# Patient Record
Sex: Female | Born: 1965 | Race: White | Hispanic: No | Marital: Single | Smoking: Current every day smoker
Health system: Southern US, Community
[De-identification: ages and names within clinical notes are randomized; demographics above are authoritative.]

## PROBLEM LIST (undated history)

## (undated) DIAGNOSIS — M1990 Unspecified osteoarthritis, unspecified site: Secondary | ICD-10-CM

## (undated) DIAGNOSIS — I10 Essential (primary) hypertension: Secondary | ICD-10-CM

## (undated) DIAGNOSIS — J984 Other disorders of lung: Secondary | ICD-10-CM

## (undated) DIAGNOSIS — I639 Cerebral infarction, unspecified: Secondary | ICD-10-CM

## (undated) DIAGNOSIS — F32A Depression, unspecified: Secondary | ICD-10-CM

## (undated) DIAGNOSIS — M419 Scoliosis, unspecified: Secondary | ICD-10-CM

## (undated) DIAGNOSIS — G8929 Other chronic pain: Principal | ICD-10-CM

## (undated) DIAGNOSIS — A0472 Enterocolitis due to Clostridium difficile, not specified as recurrent: Secondary | ICD-10-CM

## (undated) DIAGNOSIS — M549 Dorsalgia, unspecified: Secondary | ICD-10-CM

## (undated) DIAGNOSIS — M545 Low back pain: Principal | ICD-10-CM

## (undated) DIAGNOSIS — G43909 Migraine, unspecified, not intractable, without status migrainosus: Secondary | ICD-10-CM

## (undated) DIAGNOSIS — J189 Pneumonia, unspecified organism: Secondary | ICD-10-CM

## (undated) DIAGNOSIS — J309 Allergic rhinitis, unspecified: Secondary | ICD-10-CM

## (undated) DIAGNOSIS — D219 Benign neoplasm of connective and other soft tissue, unspecified: Secondary | ICD-10-CM

## (undated) DIAGNOSIS — F319 Bipolar disorder, unspecified: Secondary | ICD-10-CM

## (undated) DIAGNOSIS — F329 Major depressive disorder, single episode, unspecified: Secondary | ICD-10-CM

## (undated) DIAGNOSIS — K589 Irritable bowel syndrome without diarrhea: Secondary | ICD-10-CM

## (undated) DIAGNOSIS — J449 Chronic obstructive pulmonary disease, unspecified: Secondary | ICD-10-CM

## (undated) HISTORY — PX: OTHER SURGICAL HISTORY: SHX169

## (undated) HISTORY — PX: EXPLORATORY LAPAROTOMY: SUR591

## (undated) HISTORY — PX: DILATION AND CURETTAGE OF UTERUS: SHX78

## (undated) HISTORY — PX: TUBAL LIGATION: SHX77

## (undated) HISTORY — DX: Benign neoplasm of connective and other soft tissue, unspecified: D21.9

## (undated) HISTORY — DX: Allergic rhinitis, unspecified: J30.9

## (undated) HISTORY — DX: Essential (primary) hypertension: I10

## (undated) HISTORY — PX: CHEST TUBE INSERTION: SHX231

## (undated) HISTORY — DX: Bipolar disorder, unspecified: F31.9

## (undated) HISTORY — PX: CHOLECYSTECTOMY: SHX55

## (undated) HISTORY — DX: Major depressive disorder, single episode, unspecified: F32.9

## (undated) HISTORY — DX: Depression, unspecified: F32.A

## (undated) HISTORY — DX: Low back pain: M54.5

## (undated) HISTORY — DX: Other chronic pain: G89.29

## (undated) HISTORY — DX: Migraine, unspecified, not intractable, without status migrainosus: G43.909

## (undated) HISTORY — DX: Unspecified osteoarthritis, unspecified site: M19.90

---

## 1998-09-21 ENCOUNTER — Other Ambulatory Visit: Admission: RE | Admit: 1998-09-21 | Discharge: 1998-09-21 | Payer: Self-pay | Admitting: Obstetrics and Gynecology

## 1999-04-29 ENCOUNTER — Emergency Department (HOSPITAL_COMMUNITY): Admission: EM | Admit: 1999-04-29 | Discharge: 1999-04-29 | Payer: Self-pay | Admitting: Emergency Medicine

## 1999-05-01 ENCOUNTER — Emergency Department (HOSPITAL_COMMUNITY): Admission: EM | Admit: 1999-05-01 | Discharge: 1999-05-01 | Payer: Self-pay | Admitting: Emergency Medicine

## 1999-06-26 ENCOUNTER — Encounter: Payer: Self-pay | Admitting: Emergency Medicine

## 1999-06-26 ENCOUNTER — Emergency Department (HOSPITAL_COMMUNITY): Admission: EM | Admit: 1999-06-26 | Discharge: 1999-06-26 | Payer: Self-pay | Admitting: Emergency Medicine

## 1999-07-25 ENCOUNTER — Encounter: Admission: RE | Admit: 1999-07-25 | Discharge: 1999-07-25 | Payer: Self-pay | Admitting: Family Medicine

## 1999-07-25 ENCOUNTER — Emergency Department (HOSPITAL_COMMUNITY): Admission: EM | Admit: 1999-07-25 | Discharge: 1999-07-25 | Payer: Self-pay | Admitting: Emergency Medicine

## 1999-07-26 ENCOUNTER — Emergency Department (HOSPITAL_COMMUNITY): Admission: EM | Admit: 1999-07-26 | Discharge: 1999-07-26 | Payer: Self-pay | Admitting: Emergency Medicine

## 1999-07-31 ENCOUNTER — Encounter: Admission: RE | Admit: 1999-07-31 | Discharge: 1999-07-31 | Payer: Self-pay | Admitting: Family Medicine

## 1999-08-01 ENCOUNTER — Emergency Department (HOSPITAL_COMMUNITY): Admission: EM | Admit: 1999-08-01 | Discharge: 1999-08-01 | Payer: Self-pay | Admitting: Emergency Medicine

## 1999-08-08 ENCOUNTER — Encounter: Admission: RE | Admit: 1999-08-08 | Discharge: 1999-08-08 | Payer: Self-pay | Admitting: Family Medicine

## 1999-08-27 ENCOUNTER — Encounter: Admission: RE | Admit: 1999-08-27 | Discharge: 1999-08-27 | Payer: Self-pay | Admitting: Family Medicine

## 1999-09-03 ENCOUNTER — Encounter: Admission: RE | Admit: 1999-09-03 | Discharge: 1999-09-03 | Payer: Self-pay | Admitting: Family Medicine

## 1999-09-03 ENCOUNTER — Other Ambulatory Visit: Admission: RE | Admit: 1999-09-03 | Discharge: 1999-09-03 | Payer: Self-pay | Admitting: Family Medicine

## 1999-09-17 ENCOUNTER — Encounter: Admission: RE | Admit: 1999-09-17 | Discharge: 1999-09-17 | Payer: Self-pay | Admitting: Sports Medicine

## 1999-10-14 ENCOUNTER — Ambulatory Visit (HOSPITAL_COMMUNITY): Admission: RE | Admit: 1999-10-14 | Discharge: 1999-10-14 | Payer: Self-pay | Admitting: *Deleted

## 1999-10-27 ENCOUNTER — Emergency Department (HOSPITAL_COMMUNITY): Admission: EM | Admit: 1999-10-27 | Discharge: 1999-10-27 | Payer: Self-pay | Admitting: Emergency Medicine

## 1999-10-29 ENCOUNTER — Encounter: Admission: RE | Admit: 1999-10-29 | Discharge: 1999-10-29 | Payer: Self-pay | Admitting: Family Medicine

## 1999-10-31 ENCOUNTER — Encounter: Admission: RE | Admit: 1999-10-31 | Discharge: 1999-12-11 | Payer: Self-pay

## 1999-11-12 ENCOUNTER — Encounter: Admission: RE | Admit: 1999-11-12 | Discharge: 1999-11-12 | Payer: Self-pay | Admitting: Sports Medicine

## 1999-11-15 ENCOUNTER — Encounter: Admission: RE | Admit: 1999-11-15 | Discharge: 1999-11-15 | Payer: Self-pay | Admitting: Sports Medicine

## 1999-11-29 ENCOUNTER — Encounter: Admission: RE | Admit: 1999-11-29 | Discharge: 1999-11-29 | Payer: Self-pay | Admitting: Family Medicine

## 1999-12-10 ENCOUNTER — Encounter: Admission: RE | Admit: 1999-12-10 | Discharge: 1999-12-10 | Payer: Self-pay | Admitting: Sports Medicine

## 1999-12-19 ENCOUNTER — Encounter: Admission: RE | Admit: 1999-12-19 | Discharge: 1999-12-19 | Payer: Self-pay | Admitting: Family Medicine

## 1999-12-24 ENCOUNTER — Encounter: Admission: RE | Admit: 1999-12-24 | Discharge: 1999-12-24 | Payer: Self-pay | Admitting: Sports Medicine

## 1999-12-27 ENCOUNTER — Inpatient Hospital Stay (HOSPITAL_COMMUNITY): Admission: AD | Admit: 1999-12-27 | Discharge: 1999-12-27 | Payer: Self-pay | Admitting: Obstetrics & Gynecology

## 1999-12-31 ENCOUNTER — Inpatient Hospital Stay (HOSPITAL_COMMUNITY): Admission: RE | Admit: 1999-12-31 | Discharge: 1999-12-31 | Payer: Self-pay

## 2000-01-01 ENCOUNTER — Encounter: Admission: RE | Admit: 2000-01-01 | Discharge: 2000-01-01 | Payer: Self-pay | Admitting: Family Medicine

## 2000-01-07 ENCOUNTER — Encounter: Admission: RE | Admit: 2000-01-07 | Discharge: 2000-01-07 | Payer: Self-pay | Admitting: Sports Medicine

## 2000-01-15 ENCOUNTER — Encounter: Admission: RE | Admit: 2000-01-15 | Discharge: 2000-01-15 | Payer: Self-pay | Admitting: Family Medicine

## 2000-01-24 ENCOUNTER — Inpatient Hospital Stay (HOSPITAL_COMMUNITY): Admission: AD | Admit: 2000-01-24 | Discharge: 2000-01-24 | Payer: Self-pay | Admitting: Obstetrics & Gynecology

## 2000-01-25 ENCOUNTER — Inpatient Hospital Stay (HOSPITAL_COMMUNITY): Admission: AD | Admit: 2000-01-25 | Discharge: 2000-01-28 | Payer: Self-pay | Admitting: Obstetrics

## 2000-01-30 ENCOUNTER — Encounter (HOSPITAL_COMMUNITY): Admission: AD | Admit: 2000-01-30 | Discharge: 2000-04-29 | Payer: Self-pay | Admitting: Obstetrics & Gynecology

## 2000-02-06 ENCOUNTER — Inpatient Hospital Stay (HOSPITAL_COMMUNITY): Admission: AD | Admit: 2000-02-06 | Discharge: 2000-02-06 | Payer: Self-pay | Admitting: Obstetrics & Gynecology

## 2000-02-12 ENCOUNTER — Encounter: Admission: RE | Admit: 2000-02-12 | Discharge: 2000-02-12 | Payer: Self-pay | Admitting: Family Medicine

## 2000-02-18 ENCOUNTER — Inpatient Hospital Stay (HOSPITAL_COMMUNITY): Admission: AD | Admit: 2000-02-18 | Discharge: 2000-02-20 | Payer: Self-pay | Admitting: *Deleted

## 2000-02-24 ENCOUNTER — Encounter: Admission: RE | Admit: 2000-02-24 | Discharge: 2000-02-24 | Payer: Self-pay | Admitting: Family Medicine

## 2000-03-05 ENCOUNTER — Encounter: Admission: RE | Admit: 2000-03-05 | Discharge: 2000-03-05 | Payer: Self-pay | Admitting: Family Medicine

## 2000-03-13 ENCOUNTER — Encounter: Admission: RE | Admit: 2000-03-13 | Discharge: 2000-03-13 | Payer: Self-pay | Admitting: Family Medicine

## 2000-03-20 ENCOUNTER — Encounter: Admission: RE | Admit: 2000-03-20 | Discharge: 2000-03-20 | Payer: Self-pay | Admitting: Family Medicine

## 2000-03-31 ENCOUNTER — Encounter: Admission: RE | Admit: 2000-03-31 | Discharge: 2000-03-31 | Payer: Self-pay | Admitting: Family Medicine

## 2000-04-08 ENCOUNTER — Inpatient Hospital Stay (HOSPITAL_COMMUNITY): Admission: AD | Admit: 2000-04-08 | Discharge: 2000-04-08 | Payer: Self-pay | Admitting: Obstetrics

## 2000-04-13 ENCOUNTER — Encounter: Admission: RE | Admit: 2000-04-13 | Discharge: 2000-04-13 | Payer: Self-pay | Admitting: Family Medicine

## 2000-04-21 ENCOUNTER — Encounter: Admission: RE | Admit: 2000-04-21 | Discharge: 2000-04-21 | Payer: Self-pay | Admitting: Sports Medicine

## 2000-04-26 ENCOUNTER — Inpatient Hospital Stay (HOSPITAL_COMMUNITY): Admission: AD | Admit: 2000-04-26 | Discharge: 2000-04-26 | Payer: Self-pay | Admitting: Obstetrics

## 2000-04-28 ENCOUNTER — Encounter: Admission: RE | Admit: 2000-04-28 | Discharge: 2000-04-28 | Payer: Self-pay | Admitting: Sports Medicine

## 2000-05-01 ENCOUNTER — Encounter: Admission: RE | Admit: 2000-05-01 | Discharge: 2000-05-01 | Payer: Self-pay | Admitting: Family Medicine

## 2000-05-04 ENCOUNTER — Inpatient Hospital Stay (HOSPITAL_COMMUNITY): Admission: AD | Admit: 2000-05-04 | Discharge: 2000-05-06 | Payer: Self-pay | Admitting: Obstetrics

## 2000-05-07 ENCOUNTER — Inpatient Hospital Stay (HOSPITAL_COMMUNITY): Admission: EM | Admit: 2000-05-07 | Discharge: 2000-05-07 | Payer: Self-pay | Admitting: *Deleted

## 2000-05-14 ENCOUNTER — Encounter: Admission: RE | Admit: 2000-05-14 | Discharge: 2000-05-14 | Payer: Self-pay | Admitting: Family Medicine

## 2000-05-24 ENCOUNTER — Encounter (INDEPENDENT_AMBULATORY_CARE_PROVIDER_SITE_OTHER): Payer: Self-pay | Admitting: *Deleted

## 2000-06-15 ENCOUNTER — Other Ambulatory Visit: Admission: RE | Admit: 2000-06-15 | Discharge: 2000-06-15 | Payer: Self-pay | Admitting: Family Medicine

## 2000-06-15 ENCOUNTER — Encounter: Admission: RE | Admit: 2000-06-15 | Discharge: 2000-06-15 | Payer: Self-pay | Admitting: Family Medicine

## 2000-07-01 ENCOUNTER — Encounter: Admission: RE | Admit: 2000-07-01 | Discharge: 2000-07-01 | Payer: Self-pay | Admitting: Family Medicine

## 2000-07-11 ENCOUNTER — Emergency Department (HOSPITAL_COMMUNITY): Admission: EM | Admit: 2000-07-11 | Discharge: 2000-07-11 | Payer: Self-pay | Admitting: Emergency Medicine

## 2000-07-14 ENCOUNTER — Encounter: Admission: RE | Admit: 2000-07-14 | Discharge: 2000-07-14 | Payer: Self-pay | Admitting: Sports Medicine

## 2000-07-16 ENCOUNTER — Encounter: Admission: RE | Admit: 2000-07-16 | Discharge: 2000-08-11 | Payer: Self-pay | Admitting: Family Medicine

## 2000-08-03 ENCOUNTER — Encounter: Admission: RE | Admit: 2000-08-03 | Discharge: 2000-08-03 | Payer: Self-pay | Admitting: Family Medicine

## 2000-08-11 ENCOUNTER — Encounter: Admission: RE | Admit: 2000-08-11 | Discharge: 2000-08-11 | Payer: Self-pay | Admitting: Family Medicine

## 2000-08-19 ENCOUNTER — Encounter: Admission: RE | Admit: 2000-08-19 | Discharge: 2000-08-19 | Payer: Self-pay | Admitting: Family Medicine

## 2000-08-26 ENCOUNTER — Encounter: Admission: RE | Admit: 2000-08-26 | Discharge: 2000-08-26 | Payer: Self-pay | Admitting: Family Medicine

## 2000-08-31 ENCOUNTER — Encounter: Payer: Self-pay | Admitting: Family Medicine

## 2000-08-31 ENCOUNTER — Encounter: Admission: RE | Admit: 2000-08-31 | Discharge: 2000-08-31 | Payer: Self-pay | Admitting: Family Medicine

## 2000-09-04 ENCOUNTER — Ambulatory Visit (HOSPITAL_COMMUNITY): Admission: RE | Admit: 2000-09-04 | Discharge: 2000-09-04 | Payer: Self-pay | Admitting: Family Medicine

## 2000-09-04 ENCOUNTER — Encounter: Admission: RE | Admit: 2000-09-04 | Discharge: 2000-09-04 | Payer: Self-pay | Admitting: Family Medicine

## 2000-09-07 ENCOUNTER — Ambulatory Visit (HOSPITAL_COMMUNITY): Admission: RE | Admit: 2000-09-07 | Discharge: 2000-09-07 | Payer: Self-pay | Admitting: Family Medicine

## 2000-09-10 ENCOUNTER — Encounter: Admission: RE | Admit: 2000-09-10 | Discharge: 2000-09-10 | Payer: Self-pay | Admitting: Family Medicine

## 2000-09-15 ENCOUNTER — Encounter: Admission: RE | Admit: 2000-09-15 | Discharge: 2000-09-15 | Payer: Self-pay | Admitting: Family Medicine

## 2000-09-21 ENCOUNTER — Encounter: Admission: RE | Admit: 2000-09-21 | Discharge: 2000-09-21 | Payer: Self-pay | Admitting: Family Medicine

## 2000-10-27 ENCOUNTER — Encounter: Admission: RE | Admit: 2000-10-27 | Discharge: 2000-10-27 | Payer: Self-pay | Admitting: Family Medicine

## 2000-12-04 ENCOUNTER — Encounter: Admission: RE | Admit: 2000-12-04 | Discharge: 2000-12-04 | Payer: Self-pay | Admitting: Family Medicine

## 2000-12-22 ENCOUNTER — Encounter: Admission: RE | Admit: 2000-12-22 | Discharge: 2000-12-22 | Payer: Self-pay | Admitting: Family Medicine

## 2001-02-08 ENCOUNTER — Encounter: Admission: RE | Admit: 2001-02-08 | Discharge: 2001-02-08 | Payer: Self-pay | Admitting: Family Medicine

## 2001-06-20 ENCOUNTER — Encounter: Payer: Self-pay | Admitting: Emergency Medicine

## 2001-06-20 ENCOUNTER — Emergency Department (HOSPITAL_COMMUNITY): Admission: EM | Admit: 2001-06-20 | Discharge: 2001-06-20 | Payer: Self-pay | Admitting: Emergency Medicine

## 2001-07-27 ENCOUNTER — Ambulatory Visit (HOSPITAL_COMMUNITY): Admission: RE | Admit: 2001-07-27 | Discharge: 2001-07-27 | Payer: Self-pay | Admitting: Family Medicine

## 2001-07-27 ENCOUNTER — Encounter: Payer: Self-pay | Admitting: Family Medicine

## 2001-09-08 ENCOUNTER — Encounter: Payer: Self-pay | Admitting: Family Medicine

## 2001-09-08 ENCOUNTER — Ambulatory Visit (HOSPITAL_COMMUNITY): Admission: RE | Admit: 2001-09-08 | Discharge: 2001-09-08 | Payer: Self-pay | Admitting: Family Medicine

## 2002-12-05 ENCOUNTER — Emergency Department (HOSPITAL_COMMUNITY): Admission: EM | Admit: 2002-12-05 | Discharge: 2002-12-05 | Payer: Self-pay

## 2003-02-15 ENCOUNTER — Ambulatory Visit (HOSPITAL_COMMUNITY): Admission: RE | Admit: 2003-02-15 | Discharge: 2003-02-15 | Payer: Self-pay | Admitting: Internal Medicine

## 2003-02-15 ENCOUNTER — Encounter: Payer: Self-pay | Admitting: Internal Medicine

## 2003-03-01 ENCOUNTER — Encounter
Admission: RE | Admit: 2003-03-01 | Discharge: 2003-05-30 | Payer: Self-pay | Admitting: Physical Medicine & Rehabilitation

## 2003-10-18 ENCOUNTER — Ambulatory Visit (HOSPITAL_COMMUNITY)
Admission: RE | Admit: 2003-10-18 | Discharge: 2003-10-18 | Payer: Self-pay | Admitting: Physical Medicine & Rehabilitation

## 2004-03-26 ENCOUNTER — Emergency Department (HOSPITAL_COMMUNITY): Admission: EM | Admit: 2004-03-26 | Discharge: 2004-03-27 | Payer: Self-pay | Admitting: Emergency Medicine

## 2004-08-28 ENCOUNTER — Inpatient Hospital Stay (HOSPITAL_COMMUNITY): Admission: AD | Admit: 2004-08-28 | Discharge: 2004-08-29 | Payer: Self-pay | Admitting: *Deleted

## 2004-10-08 ENCOUNTER — Emergency Department (HOSPITAL_COMMUNITY): Admission: EM | Admit: 2004-10-08 | Discharge: 2004-10-09 | Payer: Self-pay | Admitting: Emergency Medicine

## 2005-04-18 ENCOUNTER — Ambulatory Visit: Payer: Self-pay | Admitting: Nurse Practitioner

## 2005-04-21 ENCOUNTER — Ambulatory Visit: Payer: Self-pay | Admitting: *Deleted

## 2005-05-01 ENCOUNTER — Ambulatory Visit: Payer: Self-pay | Admitting: Nurse Practitioner

## 2005-08-07 ENCOUNTER — Emergency Department (HOSPITAL_COMMUNITY): Admission: EM | Admit: 2005-08-07 | Discharge: 2005-08-07 | Payer: Self-pay | Admitting: Emergency Medicine

## 2005-09-05 ENCOUNTER — Emergency Department (HOSPITAL_COMMUNITY): Admission: EM | Admit: 2005-09-05 | Discharge: 2005-09-05 | Payer: Self-pay | Admitting: Emergency Medicine

## 2005-10-17 ENCOUNTER — Emergency Department (HOSPITAL_COMMUNITY): Admission: EM | Admit: 2005-10-17 | Discharge: 2005-10-17 | Payer: Self-pay | Admitting: Emergency Medicine

## 2005-11-20 ENCOUNTER — Ambulatory Visit (HOSPITAL_COMMUNITY): Admission: RE | Admit: 2005-11-20 | Discharge: 2005-11-20 | Payer: Self-pay | Admitting: *Deleted

## 2006-01-18 ENCOUNTER — Emergency Department (HOSPITAL_COMMUNITY): Admission: EM | Admit: 2006-01-18 | Discharge: 2006-01-18 | Payer: Self-pay | Admitting: Emergency Medicine

## 2006-02-14 ENCOUNTER — Emergency Department (HOSPITAL_COMMUNITY): Admission: EM | Admit: 2006-02-14 | Discharge: 2006-02-14 | Payer: Self-pay | Admitting: Emergency Medicine

## 2006-02-19 ENCOUNTER — Emergency Department (HOSPITAL_COMMUNITY): Admission: EM | Admit: 2006-02-19 | Discharge: 2006-02-19 | Payer: Self-pay | Admitting: Emergency Medicine

## 2006-02-25 ENCOUNTER — Inpatient Hospital Stay (HOSPITAL_COMMUNITY): Admission: AD | Admit: 2006-02-25 | Discharge: 2006-02-25 | Payer: Self-pay | Admitting: Gynecology

## 2006-03-16 ENCOUNTER — Inpatient Hospital Stay (HOSPITAL_COMMUNITY): Admission: AD | Admit: 2006-03-16 | Discharge: 2006-03-16 | Payer: Self-pay | Admitting: Obstetrics & Gynecology

## 2006-03-23 ENCOUNTER — Inpatient Hospital Stay (HOSPITAL_COMMUNITY): Admission: RE | Admit: 2006-03-23 | Discharge: 2006-03-23 | Payer: Self-pay | Admitting: Obstetrics & Gynecology

## 2006-03-24 ENCOUNTER — Encounter (INDEPENDENT_AMBULATORY_CARE_PROVIDER_SITE_OTHER): Payer: Self-pay | Admitting: *Deleted

## 2006-03-24 ENCOUNTER — Ambulatory Visit: Payer: Self-pay | Admitting: Obstetrics and Gynecology

## 2006-03-24 ENCOUNTER — Ambulatory Visit (HOSPITAL_COMMUNITY): Admission: RE | Admit: 2006-03-24 | Discharge: 2006-03-24 | Payer: Self-pay | Admitting: *Deleted

## 2006-03-31 ENCOUNTER — Ambulatory Visit: Payer: Self-pay | Admitting: Nurse Practitioner

## 2006-04-08 ENCOUNTER — Ambulatory Visit: Payer: Self-pay | Admitting: Obstetrics and Gynecology

## 2006-04-14 ENCOUNTER — Ambulatory Visit: Payer: Self-pay | Admitting: Nurse Practitioner

## 2006-04-21 ENCOUNTER — Ambulatory Visit (HOSPITAL_COMMUNITY): Admission: RE | Admit: 2006-04-21 | Discharge: 2006-04-21 | Payer: Self-pay | Admitting: Nurse Practitioner

## 2006-04-22 ENCOUNTER — Ambulatory Visit (HOSPITAL_COMMUNITY): Admission: RE | Admit: 2006-04-22 | Discharge: 2006-04-22 | Payer: Self-pay | Admitting: Internal Medicine

## 2006-05-07 ENCOUNTER — Encounter: Admission: RE | Admit: 2006-05-07 | Discharge: 2006-05-07 | Payer: Self-pay | Admitting: Nurse Practitioner

## 2006-05-22 ENCOUNTER — Ambulatory Visit: Payer: Self-pay | Admitting: Family Medicine

## 2006-06-08 ENCOUNTER — Ambulatory Visit: Payer: Self-pay | Admitting: Internal Medicine

## 2006-07-01 ENCOUNTER — Emergency Department (HOSPITAL_COMMUNITY): Admission: EM | Admit: 2006-07-01 | Discharge: 2006-07-02 | Payer: Self-pay | Admitting: Emergency Medicine

## 2006-07-13 ENCOUNTER — Ambulatory Visit: Payer: Self-pay | Admitting: Nurse Practitioner

## 2006-08-11 ENCOUNTER — Ambulatory Visit: Payer: Self-pay | Admitting: Nurse Practitioner

## 2006-09-24 ENCOUNTER — Ambulatory Visit: Payer: Self-pay | Admitting: Nurse Practitioner

## 2006-10-09 ENCOUNTER — Ambulatory Visit: Payer: Self-pay | Admitting: Nurse Practitioner

## 2006-10-25 ENCOUNTER — Emergency Department (HOSPITAL_COMMUNITY): Admission: EM | Admit: 2006-10-25 | Discharge: 2006-10-25 | Payer: Self-pay | Admitting: Emergency Medicine

## 2007-01-20 ENCOUNTER — Ambulatory Visit: Payer: Self-pay | Admitting: Nurse Practitioner

## 2007-01-21 ENCOUNTER — Ambulatory Visit (HOSPITAL_COMMUNITY): Admission: RE | Admit: 2007-01-21 | Discharge: 2007-01-21 | Payer: Self-pay | Admitting: Nurse Practitioner

## 2007-01-22 ENCOUNTER — Encounter (INDEPENDENT_AMBULATORY_CARE_PROVIDER_SITE_OTHER): Payer: Self-pay | Admitting: *Deleted

## 2007-02-21 ENCOUNTER — Emergency Department (HOSPITAL_COMMUNITY): Admission: EM | Admit: 2007-02-21 | Discharge: 2007-02-21 | Payer: Self-pay | Admitting: *Deleted

## 2007-03-09 ENCOUNTER — Encounter: Admission: RE | Admit: 2007-03-09 | Discharge: 2007-03-19 | Payer: Self-pay | Admitting: Family Medicine

## 2007-04-01 ENCOUNTER — Ambulatory Visit: Payer: Self-pay | Admitting: Nurse Practitioner

## 2007-04-17 ENCOUNTER — Emergency Department (HOSPITAL_COMMUNITY): Admission: EM | Admit: 2007-04-17 | Discharge: 2007-04-18 | Payer: Self-pay | Admitting: Emergency Medicine

## 2007-05-18 ENCOUNTER — Ambulatory Visit: Payer: Self-pay | Admitting: Internal Medicine

## 2007-05-25 ENCOUNTER — Emergency Department (HOSPITAL_COMMUNITY): Admission: EM | Admit: 2007-05-25 | Discharge: 2007-05-25 | Payer: Self-pay | Admitting: Emergency Medicine

## 2007-06-09 ENCOUNTER — Ambulatory Visit: Payer: Self-pay | Admitting: Internal Medicine

## 2007-06-18 ENCOUNTER — Emergency Department (HOSPITAL_COMMUNITY): Admission: EM | Admit: 2007-06-18 | Discharge: 2007-06-18 | Payer: Self-pay | Admitting: Emergency Medicine

## 2007-07-27 ENCOUNTER — Ambulatory Visit: Payer: Self-pay | Admitting: Psychiatry

## 2007-07-27 ENCOUNTER — Inpatient Hospital Stay (HOSPITAL_COMMUNITY): Admission: AD | Admit: 2007-07-27 | Discharge: 2007-07-31 | Payer: Self-pay | Admitting: Psychiatry

## 2007-08-11 ENCOUNTER — Encounter (INDEPENDENT_AMBULATORY_CARE_PROVIDER_SITE_OTHER): Payer: Self-pay | Admitting: *Deleted

## 2007-09-08 ENCOUNTER — Ambulatory Visit: Payer: Self-pay | Admitting: Internal Medicine

## 2007-10-19 ENCOUNTER — Ambulatory Visit: Payer: Self-pay | Admitting: Family Medicine

## 2007-11-02 ENCOUNTER — Emergency Department (HOSPITAL_COMMUNITY): Admission: EM | Admit: 2007-11-02 | Discharge: 2007-11-02 | Payer: Self-pay | Admitting: Emergency Medicine

## 2007-11-12 ENCOUNTER — Emergency Department (HOSPITAL_COMMUNITY): Admission: EM | Admit: 2007-11-12 | Discharge: 2007-11-12 | Payer: Self-pay | Admitting: *Deleted

## 2007-11-16 ENCOUNTER — Encounter (INDEPENDENT_AMBULATORY_CARE_PROVIDER_SITE_OTHER): Payer: Self-pay | Admitting: Nurse Practitioner

## 2007-11-16 ENCOUNTER — Ambulatory Visit: Payer: Self-pay | Admitting: Internal Medicine

## 2007-11-16 LAB — CONVERTED CEMR LAB
AST: 12 units/L (ref 0–37)
Albumin: 4.4 g/dL (ref 3.5–5.2)
Alkaline Phosphatase: 77 units/L (ref 39–117)
Basophils Relative: 1 % (ref 0–1)
Calcium: 9.1 mg/dL (ref 8.4–10.5)
Cholesterol: 229 mg/dL — ABNORMAL HIGH (ref 0–200)
Eosinophils Relative: 4 % (ref 0–5)
Hemoglobin: 13.6 g/dL (ref 12.0–15.0)
LDL Cholesterol: 137 mg/dL — ABNORMAL HIGH (ref 0–99)
Lymphocytes Relative: 16 % (ref 12–46)
Lymphs Abs: 1.6 10*3/uL (ref 0.7–4.0)
MCHC: 32.5 g/dL (ref 30.0–36.0)
Neutro Abs: 7.4 10*3/uL (ref 1.7–7.7)
Neutrophils Relative %: 74 % (ref 43–77)
Platelets: 366 10*3/uL (ref 150–400)
Potassium: 4.5 meq/L (ref 3.5–5.3)
Preg, Serum: NEGATIVE
RBC: 4.5 M/uL (ref 3.87–5.11)
Sodium: 141 meq/L (ref 135–145)
Total Bilirubin: 0.3 mg/dL (ref 0.3–1.2)
Triglycerides: 259 mg/dL — ABNORMAL HIGH (ref <150)

## 2007-11-30 ENCOUNTER — Ambulatory Visit (HOSPITAL_COMMUNITY): Admission: RE | Admit: 2007-11-30 | Discharge: 2007-11-30 | Payer: Self-pay | Admitting: Nurse Practitioner

## 2008-03-12 ENCOUNTER — Emergency Department (HOSPITAL_COMMUNITY): Admission: EM | Admit: 2008-03-12 | Discharge: 2008-03-12 | Payer: Self-pay | Admitting: Emergency Medicine

## 2008-05-05 ENCOUNTER — Ambulatory Visit: Payer: Self-pay | Admitting: Internal Medicine

## 2008-05-05 ENCOUNTER — Encounter (INDEPENDENT_AMBULATORY_CARE_PROVIDER_SITE_OTHER): Payer: Self-pay | Admitting: Nurse Practitioner

## 2008-05-05 LAB — CONVERTED CEMR LAB
ALT: 10 units/L (ref 0–35)
Albumin: 4.6 g/dL (ref 3.5–5.2)
BUN: 6 mg/dL (ref 6–23)
Basophils Relative: 1 % (ref 0–1)
CO2: 25 meq/L (ref 19–32)
Calcium: 9.8 mg/dL (ref 8.4–10.5)
Chloride: 103 meq/L (ref 96–112)
Glucose, Bld: 82 mg/dL (ref 70–99)
HDL: 34 mg/dL — ABNORMAL LOW (ref >39)
Lymphs Abs: 1.6 10*3/uL (ref 0.7–4.0)
MCHC: 32.3 g/dL (ref 30.0–36.0)
MCV: 92.7 fL (ref 78.0–100.0)
Monocytes Relative: 10 % (ref 3–12)
Neutro Abs: 4.2 10*3/uL (ref 1.7–7.7)
Platelets: 372 10*3/uL (ref 150–400)
Potassium: 4.7 meq/L (ref 3.5–5.3)
TSH: 0.799 microintl units/mL (ref 0.350–5.50)
Triglycerides: 478 mg/dL — ABNORMAL HIGH (ref <150)
WBC: 6.8 10*3/uL (ref 4.0–10.5)

## 2008-05-19 ENCOUNTER — Emergency Department (HOSPITAL_COMMUNITY): Admission: EM | Admit: 2008-05-19 | Discharge: 2008-05-19 | Payer: Self-pay | Admitting: Emergency Medicine

## 2008-07-17 ENCOUNTER — Emergency Department (HOSPITAL_COMMUNITY): Admission: EM | Admit: 2008-07-17 | Discharge: 2008-07-17 | Payer: Self-pay | Admitting: Emergency Medicine

## 2008-09-10 ENCOUNTER — Emergency Department (HOSPITAL_COMMUNITY): Admission: EM | Admit: 2008-09-10 | Discharge: 2008-09-11 | Payer: Self-pay | Admitting: Emergency Medicine

## 2008-10-21 ENCOUNTER — Emergency Department (HOSPITAL_COMMUNITY): Admission: EM | Admit: 2008-10-21 | Discharge: 2008-10-21 | Payer: Self-pay | Admitting: Emergency Medicine

## 2008-10-27 ENCOUNTER — Emergency Department (HOSPITAL_COMMUNITY): Admission: EM | Admit: 2008-10-27 | Discharge: 2008-10-27 | Payer: Self-pay | Admitting: Emergency Medicine

## 2008-11-03 ENCOUNTER — Emergency Department (HOSPITAL_COMMUNITY): Admission: EM | Admit: 2008-11-03 | Discharge: 2008-11-04 | Payer: Self-pay | Admitting: Emergency Medicine

## 2008-12-11 ENCOUNTER — Emergency Department (HOSPITAL_COMMUNITY): Admission: EM | Admit: 2008-12-11 | Discharge: 2008-12-11 | Payer: Self-pay | Admitting: Emergency Medicine

## 2008-12-24 ENCOUNTER — Emergency Department: Payer: Self-pay | Admitting: Emergency Medicine

## 2009-03-29 ENCOUNTER — Emergency Department (HOSPITAL_COMMUNITY): Admission: EM | Admit: 2009-03-29 | Discharge: 2009-03-29 | Payer: Self-pay | Admitting: Emergency Medicine

## 2009-04-02 ENCOUNTER — Encounter: Admission: RE | Admit: 2009-04-02 | Discharge: 2009-04-02 | Payer: Self-pay | Admitting: Specialist

## 2009-04-25 ENCOUNTER — Encounter: Admission: RE | Admit: 2009-04-25 | Discharge: 2009-04-25 | Payer: Self-pay | Admitting: Specialist

## 2009-06-16 ENCOUNTER — Emergency Department (HOSPITAL_COMMUNITY): Admission: EM | Admit: 2009-06-16 | Discharge: 2009-06-17 | Payer: Self-pay | Admitting: Emergency Medicine

## 2009-06-18 ENCOUNTER — Emergency Department (HOSPITAL_COMMUNITY): Admission: EM | Admit: 2009-06-18 | Discharge: 2009-06-18 | Payer: Self-pay | Admitting: Internal Medicine

## 2009-06-19 ENCOUNTER — Emergency Department (HOSPITAL_COMMUNITY): Admission: EM | Admit: 2009-06-19 | Discharge: 2009-06-19 | Payer: Self-pay | Admitting: Emergency Medicine

## 2009-08-05 ENCOUNTER — Emergency Department (HOSPITAL_BASED_OUTPATIENT_CLINIC_OR_DEPARTMENT_OTHER): Admission: EM | Admit: 2009-08-05 | Discharge: 2009-08-05 | Payer: Self-pay | Admitting: Emergency Medicine

## 2009-11-14 ENCOUNTER — Emergency Department (HOSPITAL_COMMUNITY): Admission: EM | Admit: 2009-11-14 | Discharge: 2009-11-14 | Payer: Self-pay | Admitting: Emergency Medicine

## 2009-11-15 ENCOUNTER — Encounter (INDEPENDENT_AMBULATORY_CARE_PROVIDER_SITE_OTHER): Payer: Self-pay | Admitting: Surgery

## 2009-11-15 ENCOUNTER — Ambulatory Visit (HOSPITAL_COMMUNITY): Admission: EM | Admit: 2009-11-15 | Discharge: 2009-11-17 | Payer: Self-pay | Admitting: Emergency Medicine

## 2009-12-01 ENCOUNTER — Emergency Department (HOSPITAL_COMMUNITY): Admission: EM | Admit: 2009-12-01 | Discharge: 2009-12-01 | Payer: Self-pay | Admitting: Emergency Medicine

## 2009-12-06 ENCOUNTER — Emergency Department (HOSPITAL_COMMUNITY): Admission: EM | Admit: 2009-12-06 | Discharge: 2009-12-06 | Payer: Self-pay | Admitting: Emergency Medicine

## 2010-06-24 ENCOUNTER — Emergency Department (HOSPITAL_BASED_OUTPATIENT_CLINIC_OR_DEPARTMENT_OTHER): Admission: EM | Admit: 2010-06-24 | Discharge: 2010-06-24 | Payer: Self-pay | Admitting: Emergency Medicine

## 2010-07-15 ENCOUNTER — Emergency Department (HOSPITAL_BASED_OUTPATIENT_CLINIC_OR_DEPARTMENT_OTHER): Admission: EM | Admit: 2010-07-15 | Discharge: 2010-07-15 | Payer: Self-pay | Admitting: Emergency Medicine

## 2010-07-15 ENCOUNTER — Ambulatory Visit: Payer: Self-pay | Admitting: Diagnostic Radiology

## 2010-09-06 ENCOUNTER — Ambulatory Visit: Payer: Self-pay | Admitting: Diagnostic Radiology

## 2010-09-06 ENCOUNTER — Emergency Department (HOSPITAL_BASED_OUTPATIENT_CLINIC_OR_DEPARTMENT_OTHER): Admission: EM | Admit: 2010-09-06 | Discharge: 2010-09-07 | Payer: Self-pay | Admitting: Emergency Medicine

## 2010-09-19 ENCOUNTER — Encounter
Admission: RE | Admit: 2010-09-19 | Discharge: 2010-10-09 | Payer: Self-pay | Source: Home / Self Care | Admitting: Anesthesiology

## 2010-09-20 ENCOUNTER — Emergency Department (HOSPITAL_BASED_OUTPATIENT_CLINIC_OR_DEPARTMENT_OTHER): Admission: EM | Admit: 2010-09-20 | Discharge: 2010-09-20 | Payer: Self-pay | Admitting: Emergency Medicine

## 2010-10-05 ENCOUNTER — Other Ambulatory Visit: Payer: Self-pay | Admitting: Emergency Medicine

## 2010-10-05 ENCOUNTER — Emergency Department (HOSPITAL_BASED_OUTPATIENT_CLINIC_OR_DEPARTMENT_OTHER): Admission: EM | Admit: 2010-10-05 | Discharge: 2010-10-05 | Payer: Self-pay | Admitting: Emergency Medicine

## 2010-10-06 DIAGNOSIS — F39 Unspecified mood [affective] disorder: Secondary | ICD-10-CM

## 2010-10-07 ENCOUNTER — Inpatient Hospital Stay (HOSPITAL_COMMUNITY): Admission: AD | Admit: 2010-10-07 | Discharge: 2010-10-14 | Payer: Self-pay | Admitting: Psychiatry

## 2010-10-07 ENCOUNTER — Ambulatory Visit: Payer: Self-pay | Admitting: Psychiatry

## 2010-10-31 ENCOUNTER — Emergency Department (HOSPITAL_COMMUNITY): Admission: EM | Admit: 2010-10-31 | Discharge: 2010-01-31 | Payer: Self-pay | Admitting: Emergency Medicine

## 2010-11-19 ENCOUNTER — Encounter: Admit: 2010-11-19 | Payer: Self-pay | Admitting: Anesthesiology

## 2010-11-28 ENCOUNTER — Encounter: Admit: 2010-11-28 | Payer: Self-pay | Admitting: Anesthesiology

## 2010-12-09 ENCOUNTER — Encounter
Admission: RE | Admit: 2010-12-09 | Discharge: 2010-12-23 | Payer: Self-pay | Source: Home / Self Care | Attending: Anesthesiology | Admitting: Anesthesiology

## 2010-12-13 ENCOUNTER — Emergency Department (HOSPITAL_COMMUNITY)
Admission: EM | Admit: 2010-12-13 | Discharge: 2010-12-14 | Disposition: A | Discharge status: Other Acute Inpatient Hospital | Payer: Self-pay | Source: Home / Self Care | Admitting: Emergency Medicine

## 2010-12-14 ENCOUNTER — Inpatient Hospital Stay (HOSPITAL_COMMUNITY)
Admission: EM | Admit: 2010-12-14 | Discharge: 2010-12-23 | Payer: Self-pay | Source: Home / Self Care | Attending: Psychiatry | Admitting: Psychiatry

## 2010-12-15 ENCOUNTER — Encounter: Payer: Self-pay | Admitting: Nurse Practitioner

## 2010-12-16 LAB — RAPID URINE DRUG SCREEN, HOSP PERFORMED
Amphetamines: NOT DETECTED
Barbiturates: NOT DETECTED
Opiates: NOT DETECTED

## 2010-12-16 LAB — CBC
Hemoglobin: 13.5 g/dL (ref 12.0–15.0)
MCH: 29 pg (ref 26.0–34.0)
MCHC: 33.6 g/dL (ref 30.0–36.0)
MCV: 86.3 fL (ref 78.0–100.0)
RDW: 15.4 % (ref 11.5–15.5)

## 2010-12-16 LAB — DIFFERENTIAL
Basophils Relative: 1 % (ref 0–1)
Eosinophils Absolute: 0 10*3/uL (ref 0.0–0.7)
Eosinophils Relative: 0 % (ref 0–5)
Lymphs Abs: 1.4 10*3/uL (ref 0.7–4.0)
Neutro Abs: 7.1 10*3/uL (ref 1.7–7.7)
Neutrophils Relative %: 72 % (ref 43–77)

## 2010-12-17 LAB — COMPREHENSIVE METABOLIC PANEL
ALT: 14 U/L (ref 0–35)
AST: 16 U/L (ref 0–37)
Alkaline Phosphatase: 40 U/L (ref 39–117)
BUN: 10 mg/dL (ref 6–23)
GFR calc Af Amer: >60 mL/min (ref >60)
GFR calc non Af Amer: >60 mL/min (ref >60)

## 2010-12-23 NOTE — Discharge Summary (Signed)
  NAMEMAMYE, BOLDS                ACCOUNT NO.:  [id number]  MEDICAL RECORD NO.:  [id number]          PATIENT TYPE:  IPS  LOCATION:  0306                          FACILITY:  [facility]  PHYSICIAN:  Eulogio Ditch, MD DATE OF BIRTH:  07-Oct-1966  DATE OF ADMISSION:  12/14/2010 DATE OF DISCHARGE:  12/23/2010                              DISCHARGE SUMMARY   HISTORY OF PRESENT ILLNESS:  45 year old white female with a history of bipolar disorder and substance abuse who was admitted and she was feeling depressed and was having suicidal ideation with plan to cut herself with razor blade.  The patient was recently at [facility] November 14 through November 21.  At the time of admission she reported that her acquaintance died from cancer so that is why she was feeling upset.  PAST PSYCH HISTORY:  The patient has a long history of drug abuse and mood instability.  The patient has been diagnosed bipolar disorder in the past.  SOCIAL HISTORY:  The patient is divorced.  She received disability for bipolar disorder.  Her 18 year old daughter lives with her.  ALCOHOL AND DRUG ABUSE:  The patient has a history of cocaine abuse and marijuana and benzos abuse in the past.  PRIMARY CARE Ieesha Abbasi:  Dr. Mayford Knife and also the [facility] clinic.  MEDICAL PROBLEMS:  Degenerative disk disease, dyslipidemia.  HOSPITAL COURSE:  The patient was started on her regular psych medication Lexapro 20 mg p.o. daily and Geodon 80 mg twice a day. Psychoeducation was given to the patient regarding addiction potential of oxycodone the patient initially appeared to be detoxed on Roxicodone. I put the patient on Celebrex and but the patient after 2 or 3 days wanted to back on oxycodone.  I told the patient that this time she will be continued on oxycodone but next time that she will be admitted, we will not be able to provide her with oxycodone because she has a history of drug abuse and she can  easily become addicted to the oxycodone.  The patient was very demanding for the medication during the hospital stay. On December 23, 2010 when I saw the patient, the patient was doing much better.  The patient denied any suicidal or homicidal ideations.  The patient denied any psychotic or manic symptoms.  Again psychoeducation was given regarding addiction potential of the oxycodone.  DISCHARGE MEDICATIONS: 1. Lexapro 20 mg p.o. daily. 2. Neurontin 400 mg b.i.d. 3. Geodon 160 mg at bedtime. 4. Celebrex 100 mg twice a day.  The patient was also advised to continue regular medical meds.  DISCHARGE FOLLOWUP:  [facility] 1. Appointment February 2 at 11:00 a.m. 2. [facility] February 3 at  9:30 a.m.  DISCHARGE DIAGNOSIS:  Mood disorder NOS, polysubstance abuse.     Eulogio Ditch, MD     SA/MEDQ  D:  12/23/2010  T:  12/23/2010  Job:  [id number]  Electronically Signed by Eulogio Ditch  on 12/23/2010 06:26:03 PM

## 2010-12-24 NOTE — H&P (Signed)
[name], [name] NO.:  [id number]  MEDICAL RECORD NO.:  [id number]          PATIENT TYPE:  IPS  LOCATION:  0503                          FACILITY:  [facility]  PHYSICIAN:  Tanya Edelson, DO        DATE OF BIRTH:  1966-07-23  DATE OF ADMISSION:  12/14/2010 DATE OF DISCHARGE:                      PSYCHIATRIC ADMISSION ASSESSMENT   This is a voluntary admission to the services of Dr. Marlis Harrington.  This is a 45 year old divorced white female.  She presented to the ED at [facility].  She stated "I want to hurt myself; I had a razor blade yesterday but I didn't do nothing".  The patient does have a history of cutting.  Apparently she became upset 2023-04-18 when an acquaintance died from cancer.  [name] was last with [place] back in November.  She was here for a week November 14 to November 21.  She states today she is hoping to get help to develop better "coping skills".  She has missed her counseling at [facility] and does not get her next disability check until the first of the month.  She is letting her 44 year old daughter stay with "friends".  PAST PSYCHIATRIC HISTORY:  She has been with [place] in 04-18-2007 after she cut herself.  She has a history for bipolar which is currently under review by SSD[employer].  She does abuse cocaine at times and rule out benzodiazepine abuse and dependence.  SOCIAL HISTORY:  She is divorced.  She receives disability for bipolar, although they are reviewing her case right now, and her 19 year old daughter lives with her.  FAMILY HISTORY:  She states that she is one of 6 children, all of whom have psychiatric issues.  ALCOHOL AND DRUG HISTORY:  As already stated, she does have a prior history for cocaine abuse.  In fact, she lost custody of the children due to this.  She is currently in pain management at the [facility].  PRIMARY CARE PROVIDER:  A Dr. Mayford Knife and also the [facility].  MEDICAL  PROBLEMS:  She is known to have degenerative disk disease, chronic back pain, dyslipidemia.  MEDICATIONS: 1. She states that she is currently on Mobic 15 mg p.o. daily. 2. Lexapro 20 mg p.o. daily. 3. Neurontin 600 mg p.o. b.i.d. 4. Xanax 1 mg p.o. t.i.d. 5. Oxycodone 15 mg p.o. t.i.d. 6. Zofran 8 mg q.8 h p.r.n. nausea. 7. Geodon 80 mg p.o. b.i.d. 8. Trilipix 135 mg p.o. 9. Symbicort 2 puffs b.i.d. 10.Ventolin 2 puffs q.4 to 6 h p.r.n. 11.Dr. Huston Foley stopped her Chantix, Valium and Soma.  DRUG ALLERGIES:  She has no actual drug allergies although she has had some reactions.  POSITIVE PHYSICAL FINDINGS:  Well-developed, well-nourished white female who appears her stated age.  She is emotionally labile.  She cries frequently and easily.  Vital signs are stable.  Her temperature was 98 to 98.2, pulse was 76 to 83, respirations 18 to 20, blood pressure was 131/84 to 142/91.  UDS was positive for benzodiazepines.  Alcohol level was 6.  CBC showed she is not anemic.  MENTAL STATUS  EXAM:  Today she is alert and oriented.  She is somewhat unkempt and casually dressed in her own clothing.  She appears to be adequately nourished.  Her speech is not pressured.  Her mood is depressed.  Her affect is labile.  She cries easily and frequently.  Her thought processes are somewhat clear, rational and goal oriented.  She denies that she attempted or made a gesture this time.  She realizes that it is abnormal to want to kill herself because her friend died and states that she would never do that because of her children.  She is not homicidal.  She is not having any auditory or visual hallucinations. Judgment and insight are fair.  Concentration and memory are intact.  DIAGNOSES:  AXIS I:  Depressive disorder not otherwise specified, reports that she was diagnosed as bipolar.  Cocaine abuse in remission. Rule out benzodiazepine abuse and dependence. AXIS II:  Relationship issues. AXIS III:   Degenerative disk disease, chronic back pain, dyslipidemia. AXIS IV:  Parenting and relationship issues. AXIS V:  44.  PLAN:  To admit for safety and stabilization.  We will have her attend all groups to polish up her coping skills.  The IOP was suggested for transfer on Monday.  However, she declines, she states that she cannot afford to drive here.  We will have contact with her outside counseling, [facility], and estimated length of stay is 3 to 5 days.     [name], P.A.-C.   ______________________________ Tanya Edelson, DO    MD/MEDQ  D:  12/14/2010  T:  12/14/2010  Job:  [id number]  Electronically Signed by Jaci Lazier ADAMS P.A.-C. on 12/22/2010 01:58:30 PM Electronically Signed by Tanya Edelson MD on 12/24/2010 07:57:48 PM

## 2011-02-04 LAB — CBC
MCV: 86 fL (ref 78.0–100.0)
Platelets: 423 10*3/uL — ABNORMAL HIGH (ref 150–400)
RDW: 16.1 % — ABNORMAL HIGH (ref 11.5–15.5)
WBC: 9.6 10*3/uL (ref 4.0–10.5)

## 2011-02-04 LAB — DIFFERENTIAL
Basophils Absolute: 0.1 10*3/uL (ref 0.0–0.1)
Eosinophils Relative: 0 % (ref 0–5)
Lymphocytes Relative: 14 % (ref 12–46)
Neutrophils Relative %: 74 % (ref 43–77)

## 2011-02-04 LAB — BASIC METABOLIC PANEL
BUN: 3 mg/dL — ABNORMAL LOW (ref 6–23)
CO2: 28 mEq/L (ref 19–32)
Chloride: 100 mEq/L (ref 96–112)
GFR calc Af Amer: >60 mL/min (ref >60)
Sodium: 139 mEq/L (ref 135–145)

## 2011-02-04 LAB — RAPID URINE DRUG SCREEN, HOSP PERFORMED
Barbiturates: NOT DETECTED
Benzodiazepines: POSITIVE — AB

## 2011-02-04 LAB — GLUCOSE, CAPILLARY: Glucose-Capillary: 87 mg/dL (ref 70–99)

## 2011-02-04 LAB — PREGNANCY, URINE: Preg Test, Ur: NEGATIVE

## 2011-02-05 LAB — DIFFERENTIAL
Eosinophils Absolute: 0.1 10*3/uL (ref 0.0–0.7)
Eosinophils Relative: 1 % (ref 0–5)
Lymphs Abs: 1 10*3/uL (ref 0.7–4.0)
Monocytes Absolute: 0.7 10*3/uL (ref 0.1–1.0)
Monocytes Relative: 11 % (ref 3–12)

## 2011-02-05 LAB — COMPREHENSIVE METABOLIC PANEL
ALT: 10 U/L (ref 0–35)
AST: 15 U/L (ref 0–37)
CO2: 31 mEq/L (ref 19–32)
Calcium: 10.6 mg/dL — ABNORMAL HIGH (ref 8.4–10.5)
GFR calc Af Amer: >60 mL/min (ref >60)
Sodium: 146 mEq/L — ABNORMAL HIGH (ref 135–145)
Total Protein: 7.5 g/dL (ref 6.0–8.3)

## 2011-02-05 LAB — CBC
Hemoglobin: 13.1 g/dL (ref 12.0–15.0)
MCHC: 34.4 g/dL (ref 30.0–36.0)
RDW: 14.3 % (ref 11.5–15.5)

## 2011-02-07 LAB — POCT CARDIAC MARKERS

## 2011-02-07 LAB — BASIC METABOLIC PANEL WITH GFR
BUN: 8 mg/dL (ref 6–23)
CO2: 27 meq/L (ref 19–32)
Calcium: 9.9 mg/dL (ref 8.4–10.5)
Chloride: 105 meq/L (ref 96–112)
Creatinine, Ser: 0.9 mg/dL (ref 0.4–1.2)
GFR calc non Af Amer: >60 mL/min
Glucose, Bld: 90 mg/dL (ref 70–99)
Potassium: 4.3 meq/L (ref 3.5–5.1)
Sodium: 141 meq/L (ref 135–145)

## 2011-02-07 LAB — DIFFERENTIAL
Basophils Absolute: 0.2 K/uL — ABNORMAL HIGH (ref 0.0–0.1)
Basophils Relative: 3 % — ABNORMAL HIGH (ref 0–1)
Eosinophils Absolute: 0.1 K/uL (ref 0.0–0.7)
Eosinophils Relative: 2 % (ref 0–5)
Lymphocytes Relative: 14 % (ref 12–46)
Lymphs Abs: 1.1 K/uL (ref 0.7–4.0)
Monocytes Absolute: 0.6 K/uL (ref 0.1–1.0)
Monocytes Relative: 8 % (ref 3–12)
Neutro Abs: 5.4 K/uL (ref 1.7–7.7)
Neutrophils Relative %: 73 % (ref 43–77)

## 2011-02-07 LAB — CBC
MCH: 30.3 pg (ref 26.0–34.0)
MCHC: 34.2 g/dL (ref 30.0–36.0)
MCV: 88.8 fL (ref 78.0–100.0)
Platelets: 576 10*3/uL — ABNORMAL HIGH (ref 150–400)
RDW: 13.2 % (ref 11.5–15.5)

## 2011-02-09 LAB — CBC
HCT: 38.8 % (ref 36.0–46.0)
HCT: 40.7 % (ref 36.0–46.0)
MCV: 89.1 fL (ref 78.0–100.0)
MCV: 90 fL (ref 78.0–100.0)
Platelets: 539 10*3/uL — ABNORMAL HIGH (ref 150–400)
Platelets: 665 10*3/uL — ABNORMAL HIGH (ref 150–400)
RBC: 4.36 MIL/uL (ref 3.87–5.11)
WBC: 5 10*3/uL (ref 4.0–10.5)
WBC: 8.2 10*3/uL (ref 4.0–10.5)
WBC: 8.5 10*3/uL (ref 4.0–10.5)

## 2011-02-09 LAB — COMPREHENSIVE METABOLIC PANEL
ALT: 13 U/L (ref 0–35)
AST: 17 U/L (ref 0–37)
Albumin: 4 g/dL (ref 3.5–5.2)
Alkaline Phosphatase: 40 U/L (ref 39–117)
BUN: 7 mg/dL (ref 6–23)
CO2: 24 mEq/L (ref 19–32)
CO2: 28 mEq/L (ref 19–32)
Chloride: 101 mEq/L (ref 96–112)
Chloride: 105 mEq/L (ref 96–112)
Creatinine, Ser: 0.85 mg/dL (ref 0.4–1.2)
Creatinine, Ser: 1.09 mg/dL (ref 0.4–1.2)
GFR calc Af Amer: >60 mL/min (ref >60)
GFR calc non Af Amer: 55 mL/min — ABNORMAL LOW (ref >60)
GFR calc non Af Amer: >60 mL/min (ref >60)
Potassium: 3.8 mEq/L (ref 3.5–5.1)
Total Bilirubin: 0.9 mg/dL (ref 0.3–1.2)
Total Bilirubin: 1 mg/dL (ref 0.3–1.2)

## 2011-02-09 LAB — DIFFERENTIAL
Basophils Absolute: 0 10*3/uL (ref 0.0–0.1)
Basophils Absolute: 0 10*3/uL (ref 0.0–0.1)
Basophils Absolute: 0 10*3/uL (ref 0.0–0.1)
Basophils Relative: 0 % (ref 0–1)
Eosinophils Absolute: 0.2 10*3/uL (ref 0.0–0.7)
Eosinophils Absolute: 0.2 10*3/uL (ref 0.0–0.7)
Eosinophils Relative: 2 % (ref 0–5)
Lymphocytes Relative: 13 % (ref 12–46)
Lymphocytes Relative: 16 % (ref 12–46)
Lymphocytes Relative: 24 % (ref 12–46)
Lymphs Abs: 1.1 10*3/uL (ref 0.7–4.0)
Monocytes Absolute: 0.5 10*3/uL (ref 0.1–1.0)
Neutro Abs: 3.1 10*3/uL (ref 1.7–7.7)
Neutrophils Relative %: 78 % — ABNORMAL HIGH (ref 43–77)

## 2011-02-09 LAB — LIPASE, BLOOD
Lipase: 17 U/L (ref 11–59)
Lipase: 19 U/L (ref 11–59)

## 2011-02-09 LAB — URINALYSIS, ROUTINE W REFLEX MICROSCOPIC
Nitrite: NEGATIVE
Specific Gravity, Urine: 1.01 (ref 1.005–1.030)
Urobilinogen, UA: 1 mg/dL (ref 0.0–1.0)

## 2011-02-09 LAB — POCT PREGNANCY, URINE: Preg Test, Ur: NEGATIVE

## 2011-02-14 ENCOUNTER — Emergency Department (HOSPITAL_BASED_OUTPATIENT_CLINIC_OR_DEPARTMENT_OTHER)
Admission: EM | Admit: 2011-02-14 | Discharge: 2011-02-14 | Disposition: A | Discharge status: Home / Self Care | Payer: Medicaid Other | Attending: Emergency Medicine | Admitting: Emergency Medicine

## 2011-02-14 DIAGNOSIS — Z79899 Other long term (current) drug therapy: Secondary | ICD-10-CM | POA: Insufficient documentation

## 2011-02-14 DIAGNOSIS — K089 Disorder of teeth and supporting structures, unspecified: Secondary | ICD-10-CM | POA: Insufficient documentation

## 2011-02-14 DIAGNOSIS — F319 Bipolar disorder, unspecified: Secondary | ICD-10-CM | POA: Insufficient documentation

## 2011-02-14 DIAGNOSIS — IMO0001 Reserved for inherently not codable concepts without codable children: Secondary | ICD-10-CM | POA: Insufficient documentation

## 2011-02-14 DIAGNOSIS — R112 Nausea with vomiting, unspecified: Secondary | ICD-10-CM | POA: Insufficient documentation

## 2011-02-14 DIAGNOSIS — IMO0002 Reserved for concepts with insufficient information to code with codable children: Secondary | ICD-10-CM | POA: Insufficient documentation

## 2011-02-14 LAB — PREGNANCY, URINE: Preg Test, Ur: NEGATIVE

## 2011-02-14 LAB — URINALYSIS, ROUTINE W REFLEX MICROSCOPIC
Ketones, ur: NEGATIVE mg/dL
Nitrite: NEGATIVE
Protein, ur: 100 mg/dL — AB
Urobilinogen, UA: 1 mg/dL (ref 0.0–1.0)

## 2011-02-24 LAB — CBC
HCT: 32.3 % — ABNORMAL LOW (ref 36.0–46.0)
HCT: 38.6 % (ref 36.0–46.0)
Hemoglobin: 10.9 g/dL — ABNORMAL LOW (ref 12.0–15.0)
MCHC: 34 g/dL (ref 30.0–36.0)
MCV: 91.5 fL (ref 78.0–100.0)
RBC: 3.53 MIL/uL — ABNORMAL LOW (ref 3.87–5.11)
RBC: 4.22 MIL/uL (ref 3.87–5.11)
WBC: 19.2 10*3/uL — ABNORMAL HIGH (ref 4.0–10.5)

## 2011-02-24 LAB — DIFFERENTIAL
Basophils Absolute: 0.6 10*3/uL — ABNORMAL HIGH (ref 0.0–0.1)
Basophils Relative: 3 % — ABNORMAL HIGH (ref 0–1)
Eosinophils Absolute: 0 10*3/uL (ref 0.0–0.7)
Lymphocytes Relative: 4 % — ABNORMAL LOW (ref 12–46)
Monocytes Absolute: 1.5 10*3/uL — ABNORMAL HIGH (ref 0.1–1.0)
Neutro Abs: 16.3 10*3/uL — ABNORMAL HIGH (ref 1.7–7.7)
Neutrophils Relative %: 85 % — ABNORMAL HIGH (ref 43–77)

## 2011-02-24 LAB — COMPREHENSIVE METABOLIC PANEL
AST: 17 U/L (ref 0–37)
Albumin: 4.1 g/dL (ref 3.5–5.2)
Alkaline Phosphatase: 36 U/L — ABNORMAL LOW (ref 39–117)
BUN: 9 mg/dL (ref 6–23)
CO2: 28 mEq/L (ref 19–32)
Chloride: 102 mEq/L (ref 96–112)
Creatinine, Ser: 0.84 mg/dL (ref 0.4–1.2)
GFR calc non Af Amer: >60 mL/min (ref >60)
Potassium: 4 mEq/L (ref 3.5–5.1)
Total Bilirubin: 0.9 mg/dL (ref 0.3–1.2)

## 2011-02-24 LAB — URINALYSIS, ROUTINE W REFLEX MICROSCOPIC
Bilirubin Urine: NEGATIVE
Glucose, UA: NEGATIVE mg/dL
Hgb urine dipstick: NEGATIVE
Ketones, ur: NEGATIVE mg/dL
Protein, ur: NEGATIVE mg/dL
pH: 7 (ref 5.0–8.0)

## 2011-02-24 LAB — RAPID URINE DRUG SCREEN, HOSP PERFORMED
Amphetamines: POSITIVE — AB
Barbiturates: NOT DETECTED
Opiates: NOT DETECTED

## 2011-02-24 LAB — CREATININE, SERUM
Creatinine, Ser: 0.8 mg/dL (ref 0.4–1.2)
GFR calc non Af Amer: >60 mL/min (ref >60)

## 2011-03-02 LAB — URINALYSIS, ROUTINE W REFLEX MICROSCOPIC
Glucose, UA: NEGATIVE mg/dL
Protein, ur: NEGATIVE mg/dL
Specific Gravity, Urine: 1.019 (ref 1.005–1.030)
Urobilinogen, UA: 1 mg/dL (ref 0.0–1.0)

## 2011-03-02 LAB — RAPID URINE DRUG SCREEN, HOSP PERFORMED
Barbiturates: NOT DETECTED
Opiates: NOT DETECTED

## 2011-03-03 ENCOUNTER — Other Ambulatory Visit: Payer: Self-pay | Admitting: Radiology

## 2011-03-10 LAB — RAPID STREP SCREEN (MED CTR MEBANE ONLY): Streptococcus, Group A Screen (Direct): NEGATIVE

## 2011-03-19 ENCOUNTER — Emergency Department (HOSPITAL_BASED_OUTPATIENT_CLINIC_OR_DEPARTMENT_OTHER)
Admission: EM | Admit: 2011-03-19 | Discharge: 2011-03-19 | Disposition: A | Discharge status: Home / Self Care | Payer: Medicaid Other | Attending: Emergency Medicine | Admitting: Emergency Medicine

## 2011-03-19 DIAGNOSIS — Z79899 Other long term (current) drug therapy: Secondary | ICD-10-CM | POA: Insufficient documentation

## 2011-03-19 DIAGNOSIS — IMO0002 Reserved for concepts with insufficient information to code with codable children: Secondary | ICD-10-CM | POA: Insufficient documentation

## 2011-03-19 DIAGNOSIS — G839 Paralytic syndrome, unspecified: Secondary | ICD-10-CM | POA: Insufficient documentation

## 2011-03-19 DIAGNOSIS — G8929 Other chronic pain: Secondary | ICD-10-CM | POA: Insufficient documentation

## 2011-03-19 DIAGNOSIS — F17200 Nicotine dependence, unspecified, uncomplicated: Secondary | ICD-10-CM | POA: Insufficient documentation

## 2011-03-19 DIAGNOSIS — F319 Bipolar disorder, unspecified: Secondary | ICD-10-CM | POA: Insufficient documentation
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
## 2011-04-11 NOTE — Op Note (Signed)
NAMEGRACIEMAE, [name]                ACCOUNT NO.:  [id number]   MEDICAL RECORD NO.:  [id number]          PATIENT TYPE:  AMB   LOCATION:  SDC                           FACILITY:  [facility]   PHYSICIAN:  Phil D. Okey Dupre, M.D.     DATE OF BIRTH:  10/01/1966   DATE OF PROCEDURE:  03/24/2006  DATE OF DISCHARGE:                                 OPERATIVE REPORT   PROCEDURE:  Dilatation evacuation.   PREOPERATIVE DIAGNOSIS:  Missed abortion.   POSTOPERATIVE DIAGNOSIS:  Missed abortion.   SURGEON:  Dr. Okey Dupre.   ANESTHESIA:  MAC plus local.   Products of conception to pathology.   POSTOPERATIVE CONDITION:  Satisfactory.   PROCEDURE AS FOLLOWS:  Under satisfactory MAC analgesia with the patient in  dorsal lithotomy position, the perineum and vagina prepped and draped in  usual sterile manner.  Bimanual pelvic examination revealed the uterus to be  approximately eight weeks gestational size, anterior flexed, freely movable.  A weighted speculum was placed in the posterior fourchette of the vagina  through a marital introitus.  BUS was within normal limits.  Vagina was  clean and well rugated. The anterior lip of the cervix grasped with a single-  tooth tenaculum.  Ten cc of 1% Xylocaine were injected at 8 and another 10  cc at 4 o'clock in the paracervical area for further patient comfort. The  uterine cavity was sounded to a depth of 10.5 cm. Cervical os dilated to a  #10 Hagar dilator, and a curved #10 suction curette was used to evacuate the  uterine contents without incident.  The tenaculum and speculum were removed  from vagina.  The patient was transferred to the recovery room in  satisfactory condition, having tolerated the procedure well.           ______________________________  Javier Glazier. Okey Dupre, M.D.     PDR/MEDQ  D:  03/24/2006  T:  03/25/2006  Job:  [id number]

## 2011-04-11 NOTE — Discharge Summary (Signed)
[facility]  Patient:    [name], [name]                      MRN: [id number] Adm. Date:  [id number] Disc. Date: [id number] Attending:  Michaelle Copas Dictator:   Nolon Nations, M.D.                           Discharge Summary  ADMISSION DIAGNOSES:          1. Preterm contractions.                               2. Cervicitis.                               3. Herpes simplex virus.  DISCHARGE DIAGNOSES:          1. Preterm contractions.                               2. Cervicitis.                               3. Herpes simplex virus.  HISTORY OF PRESENT ILLNESS:   [name] is a 45 year old, gravida 2, para 1-0-0-1, who presented with preterm contractions.  She had recently been hospitalized for preterm labor and had gone home.  She presented with worsening contractions. She also had some burning-like feeling at the perineum.  Please refer to the admission note for more complete history and physical.  HOSPITAL COURSE:  #1 - Labor.  The patient was given Terbutaline and fluids.  She was given betamethasone IM x 2.  Her contractions quieted down immediately and she decreased to 0 to 2 contractions by the time of discharge.  She did not require magnesium  during her hospital stay.  She did not require further treatment for tocolysis during her hospital day.  The patient on admission had a cervix that was 1 cm thick and long.  This had not changed from previous checks per the admitting physician.  #2 - Cervicitis.  The patient was started on Unasyn 3 grams IV q.6h. for a presumed cervicitis.  She was maintained until discharge.  #3 - HSV.  No active lesions were seen.  She was given acyclovir 400 p.o. b.i.d. which was switched to Valtrex 500 b.i.d.  She was discharged on 1000 b.i.d.  #4 - Chest pain.  The patient did complain of chest pain on the night of March 8. She said the pain was very similar to her reflux disease that  she has had in the past.  She said this was often exacerbated by the hospital food.  She had an EKG which was normal.  She was started on Zantac 150 mg p.o. b.i.d.  CONDITION ON DISCHARGE:       Good.  Discharged to home.  DISCHARGE INSTRUCTIONS:       The patient was instructed to continue bed rest and instructed to return to the MAU or to call her doctor should she have contractions again.  DISCHARGE MEDICATIONS:        1. Zantac one p.o. b.i.d.  2. Valtrex 1000 mg p.o. b.i.d.  FOLLOW-UP:                    The patient is instructed to keep her follow-up visit scheduled for Monday, April 1. DD:  02/20/00 TD:  02/20/00 Job: [id number]

## 2011-06-05 ENCOUNTER — Emergency Department (HOSPITAL_BASED_OUTPATIENT_CLINIC_OR_DEPARTMENT_OTHER)
Admission: EM | Admit: 2011-06-05 | Discharge: 2011-06-05 | Disposition: A | Discharge status: Home / Self Care | Payer: Medicaid Other | Attending: Emergency Medicine | Admitting: Emergency Medicine

## 2011-06-05 ENCOUNTER — Encounter: Payer: Self-pay | Admitting: *Deleted

## 2011-06-05 DIAGNOSIS — J4489 Other specified chronic obstructive pulmonary disease: Secondary | ICD-10-CM | POA: Insufficient documentation

## 2011-06-05 DIAGNOSIS — M549 Dorsalgia, unspecified: Secondary | ICD-10-CM | POA: Insufficient documentation

## 2011-06-05 DIAGNOSIS — J449 Chronic obstructive pulmonary disease, unspecified: Secondary | ICD-10-CM | POA: Insufficient documentation

## 2011-06-05 DIAGNOSIS — B9789 Other viral agents as the cause of diseases classified elsewhere: Secondary | ICD-10-CM | POA: Insufficient documentation

## 2011-06-05 DIAGNOSIS — J029 Acute pharyngitis, unspecified: Secondary | ICD-10-CM

## 2011-06-05 HISTORY — DX: Scoliosis, unspecified: M41.9

## 2011-06-05 HISTORY — DX: Dorsalgia, unspecified: M54.9

## 2011-06-05 HISTORY — DX: Chronic obstructive pulmonary disease, unspecified: J44.9

## 2011-06-05 LAB — RAPID STREP SCREEN (MED CTR MEBANE ONLY): Streptococcus, Group A Screen (Direct): NEGATIVE

## 2011-06-05 MED ORDER — PREDNISONE 10 MG PO TABS: 20.0000 mg | ORAL_TABLET | Freq: Every day | ORAL | Status: AC

## 2011-06-05 MED ORDER — PREDNISONE 10 MG PO TABS: 20.0000 mg | ORAL_TABLET | Freq: Every day | ORAL | Status: DC

## 2011-06-05 NOTE — ED Notes (Signed)
Pt c/o sore throat x2 days 

## 2011-06-05 NOTE — ED Notes (Signed)
Pt has and hx of COPD and asthma but is here with a sore throat

## 2011-06-05 NOTE — ED Provider Notes (Addendum)
History     Chief Complaint  Patient presents with  . Sore Throat   HPI Pt c/o sore throat, worse on right side x 2 days.  Associated w/ cough (worse than baseline), nasal congestion and bilateral ear pressure.  No fever.  Daughter with sore throat as well.  No known sick contacts.  Pt recently moved into an old house and concerned that there may be a mold problem.    Past Medical History  Diagnosis Date  . Asthma   . COPD (chronic obstructive pulmonary disease)   . Back pain   . Scoliosis     Past Surgical History  Procedure Date  . Cholecystectomy     History reviewed. No pertinent family history.  History  Substance Use Topics  . Smoking status: Current Everyday Smoker -- 1.0 packs/day  . Smokeless tobacco: Not on file  . Alcohol Use:     OB History    Grav Para Term Preterm Abortions TAB SAB Ect Mult Living                  Review of Systems  All other systems reviewed and are negative.    Physical Exam  BP 117/73  Pulse 83  Temp(Src) 98.5 F (36.9 C) (Oral)  Resp 18  SpO2 94%  LMP 05/30/2011  Physical Exam  Nursing note and vitals reviewed. Constitutional: She is oriented to person, place, and time. She appears well-developed and well-nourished. No distress.  HENT:  Head: No trismus in the jaw.  Right Ear: Tympanic membrane, external ear and ear canal normal.  Left Ear: Tympanic membrane, external ear and ear canal normal.  Mouth/Throat: Uvula is midline and mucous membranes are normal. No oropharyngeal exudate, posterior oropharyngeal edema or posterior oropharyngeal erythema.       Mild erythema of posterior pharynx  Eyes:       Normal appearance  Neck: Normal range of motion. Neck supple.  Cardiovascular: Normal rate and regular rhythm.   Pulmonary/Chest: Effort normal and breath sounds normal. No respiratory distress.       Wheezing at bilateral lung bases  Lymphadenopathy:    She has cervical adenopathy.  Neurological: She is alert and  oriented to person, place, and time.  Skin: Skin is warm and dry. No rash noted.  Psychiatric: She has a normal mood and affect. Her behavior is normal.    ED Course  Procedures  MDM Pt w/ h/o COPD presents w/ sore throat x 2 days.  On exam, pt afebrile, in NAD, mild erythema of posterior pharynx but tonsils appear nml and no exudate, cervical lymphadenopathy, wheezing at bilateral lung bases.  Strep screen obtained by  Nursing staff prior to exam and neg for strep.  Pt likely has a viral URI.  Discharged home w/ prednisone for inflammation/pain.  Recommended f/u w/ PCP if sx not improved by next week and return precautions discussed.       Otilio Miu, PA 06/05/11 2240  Evaluation and management procedures were performed by the PA/NP under my supervision/collaboration.    Felisa Bonier, MD 09/20/11 [phone]

## 2011-08-19 LAB — POCT CARDIAC MARKERS
CKMB, poc: <1 — ABNORMAL LOW
CKMB, poc: <1 — ABNORMAL LOW
CKMB, poc: <1 — ABNORMAL LOW
Myoglobin, poc: 26.6
Myoglobin, poc: 35
Myoglobin, poc: 43.3
Operator id: 1211
Operator id: 2725
Operator id: 2725
Troponin i, poc: <0.05
Troponin i, poc: <0.05
Troponin i, poc: <0.05

## 2011-08-19 LAB — CBC
HCT: 36.3
Hemoglobin: 12.4
MCHC: 34.2
MCV: 88.5
Platelets: 347
RBC: 4.1
RDW: 14
WBC: 8.4

## 2011-08-19 LAB — BASIC METABOLIC PANEL WITH GFR
BUN: 3 — ABNORMAL LOW
CO2: 28
Calcium: 9.8
Chloride: 106
Creatinine, Ser: 0.83
GFR calc Af Amer: >60
Glucose, Bld: 91

## 2011-08-19 LAB — BASIC METABOLIC PANEL
GFR calc non Af Amer: >60
Potassium: 4.1
Sodium: 139

## 2011-08-19 LAB — DIFFERENTIAL
Basophils Absolute: 0
Basophils Relative: 1
Eosinophils Absolute: 0.1
Eosinophils Relative: 1
Lymphocytes Relative: 17
Lymphs Abs: 1.4
Monocytes Absolute: 0.9
Monocytes Relative: 11
Neutro Abs: 5.9
Neutrophils Relative %: 71

## 2011-08-25 LAB — DIFFERENTIAL
Basophils Absolute: 0.2 — ABNORMAL HIGH
Eosinophils Relative: 6 — ABNORMAL HIGH
Lymphocytes Relative: 30
Neutrophils Relative %: 50

## 2011-08-25 LAB — COMPREHENSIVE METABOLIC PANEL
AST: 13
BUN: 8
CO2: 29
Chloride: 104
Creatinine, Ser: 0.82
GFR calc non Af Amer: >60
Glucose, Bld: 92
Total Bilirubin: 0.6

## 2011-08-25 LAB — URINALYSIS, ROUTINE W REFLEX MICROSCOPIC
Bilirubin Urine: NEGATIVE
Leukocytes, UA: NEGATIVE
Nitrite: NEGATIVE
Specific Gravity, Urine: 1.01
pH: 6.5

## 2011-08-25 LAB — CBC
HCT: 38.5
Hemoglobin: 12.9
MCHC: 33.5
MCV: 90.5
RBC: 4.26
WBC: 6.1

## 2011-08-25 LAB — URINE MICROSCOPIC-ADD ON

## 2011-08-25 LAB — LIPASE, BLOOD: Lipase: 26

## 2011-09-01 LAB — BASIC METABOLIC PANEL
BUN: 9
Creatinine, Ser: 0.8
GFR calc Af Amer: >60
GFR calc non Af Amer: >60
Potassium: 4.3

## 2011-09-05 LAB — COMPREHENSIVE METABOLIC PANEL
AST: 15
Albumin: 4
Alkaline Phosphatase: 91
Chloride: 100
GFR calc Af Amer: >60
Potassium: 3.4 — ABNORMAL LOW
Total Bilirubin: 0.8

## 2011-09-05 LAB — CBC
HCT: 38.3
Platelets: 386
WBC: 9.6

## 2013-09-20 ENCOUNTER — Emergency Department (HOSPITAL_COMMUNITY)
Admission: EM | Admit: 2013-09-20 | Discharge: 2013-09-21 | Disposition: A | Discharge status: Home / Self Care | Payer: Medicaid Other | Attending: Emergency Medicine | Admitting: Emergency Medicine

## 2013-09-20 ENCOUNTER — Encounter (HOSPITAL_COMMUNITY): Payer: Self-pay | Admitting: Emergency Medicine

## 2013-09-20 DIAGNOSIS — J441 Chronic obstructive pulmonary disease with (acute) exacerbation: Secondary | ICD-10-CM | POA: Insufficient documentation

## 2013-09-20 DIAGNOSIS — Z79899 Other long term (current) drug therapy: Secondary | ICD-10-CM | POA: Insufficient documentation

## 2013-09-20 DIAGNOSIS — B379 Candidiasis, unspecified: Secondary | ICD-10-CM

## 2013-09-20 DIAGNOSIS — J449 Chronic obstructive pulmonary disease, unspecified: Secondary | ICD-10-CM

## 2013-09-20 DIAGNOSIS — R110 Nausea: Secondary | ICD-10-CM | POA: Insufficient documentation

## 2013-09-20 DIAGNOSIS — R0602 Shortness of breath: Secondary | ICD-10-CM

## 2013-09-20 DIAGNOSIS — B3731 Acute candidiasis of vulva and vagina: Secondary | ICD-10-CM | POA: Insufficient documentation

## 2013-09-20 DIAGNOSIS — Z8739 Personal history of other diseases of the musculoskeletal system and connective tissue: Secondary | ICD-10-CM | POA: Insufficient documentation

## 2013-09-20 DIAGNOSIS — B373 Candidiasis of vulva and vagina: Secondary | ICD-10-CM | POA: Insufficient documentation

## 2013-09-20 DIAGNOSIS — F17200 Nicotine dependence, unspecified, uncomplicated: Secondary | ICD-10-CM | POA: Insufficient documentation

## 2013-09-20 LAB — URINALYSIS, ROUTINE W REFLEX MICROSCOPIC
Bilirubin Urine: NEGATIVE
Glucose, UA: NEGATIVE mg/dL
Hgb urine dipstick: NEGATIVE
Ketones, ur: NEGATIVE mg/dL
Protein, ur: NEGATIVE mg/dL

## 2013-09-20 NOTE — ED Provider Notes (Signed)
CSN: [id number]     Arrival date & time 09/20/13  2137 History   First MD Initiated Contact with Patient 09/20/13 2306     Chief Complaint  Patient presents with  . Urinary Tract Infection  . Shortness of Breath   (Consider location/radiation/quality/duration/timing/severity/associated sxs/prior Treatment) The history is provided by the patient. No language interpreter was used.  [name] is a 47 y/o F with PMhx of asthma, COPD, back pain, scoliosis presenting to the ED with UTI sensation and shortness of breath. Patient reported that she was seen and assessed by her PCP the other day secondary to burning with urination and urinary frequency - patient reported that she was placed on antibiotics, Nitrofurantoin that she completed 2 days. Patient reported that it is "bad down there." Patient reported that she continues to have dysuria and urinary frequency. Patient reported that she has been experiencing mild nausea, associated with left side abdominal discomfort described as a cramping sensation that started approximately 2 days ago. Patient reported that she has been experiencing shortness of breath with activity, reported that she does not use oxygen while at home. Patient reported that she took all of her medications today, stated that she took all of her pain medications that are for her to aid in sleeping. Patient reported that she's been experiencing vaginal discharge for the past couple of days, reported that as a thick white discharge. Denied fever, chills, chest pain, difficulty breathing, wheezing, cough, sudden loss of vision, blurred vision, nasal congestion, appetite change, vomiting, melena, hematochezia, weakness, fainting. PCP Dr. Clearance Coots  Past Medical History  Diagnosis Date  . Asthma   . COPD (chronic obstructive pulmonary disease)   . Back pain   . Scoliosis    Past Surgical History  Procedure Laterality Date  . Cholecystectomy     History reviewed. No pertinent family  history. History  Substance Use Topics  . Smoking status: Current Every Day Smoker -- 1.50 packs/day    Types: Cigarettes  . Smokeless tobacco: Never Used  . Alcohol Use: No   OB History   Grav Para Term Preterm Abortions TAB SAB Ect Mult Living                 Review of Systems  Constitutional: Negative for fever and chills.  Eyes: Negative for visual disturbance.  Respiratory: Positive for shortness of breath. Negative for cough and chest tightness.   Cardiovascular: Negative for chest pain.  Gastrointestinal: Positive for nausea and abdominal pain. Negative for vomiting, diarrhea, constipation, blood in stool and anal bleeding.  Genitourinary: Positive for dysuria and vaginal discharge.  Neurological: Negative for dizziness and weakness.  All other systems reviewed and are negative.    Allergies  Review of patient's allergies indicates no known allergies.  Home Medications   Current Outpatient Rx  Name  Route  Sig  Dispense  Refill  . albuterol (PROVENTIL,VENTOLIN) 90 MCG/ACT inhaler   Inhalation   Inhale 2 puffs into the lungs as needed. For shortness of breath         . ALPRAZolam (XANAX) 1 MG tablet   Oral   Take 1 mg by mouth 3 (three) times daily as needed for anxiety.         . budesonide-formoterol (SYMBICORT) 160-4.5 MCG/ACT inhaler   Inhalation   Inhale 2 puffs into the lungs 2 (two) times daily.           Marland Kitchen escitalopram (LEXAPRO) 20 MG tablet   Oral   Take  40 mg by mouth daily.          Marland Kitchen gabapentin (NEURONTIN) 400 MG capsule   Oral   Take 800-1,600 mg by mouth 2 (two) times daily. 800 in the morning and  1600 in the evening         . ibuprofen (ADVIL,MOTRIN) 800 MG tablet   Oral   Take 800 mg by mouth every 8 (eight) hours as needed for pain.         Marland Kitchen loratadine (CLARITIN) 10 MG tablet   Oral   Take 10 mg by mouth daily.           . methocarbamol (ROBAXIN) 750 MG tablet   Oral   Take 750 mg by mouth 4 (four) times daily.           Marland Kitchen oxyCODONE (ROXICODONE) 15 MG immediate release tablet   Oral   Take 15 mg by mouth every 4 (four) hours as needed for pain.         . promethazine (PHENERGAN) 25 MG tablet   Oral   Take 25 mg by mouth every 6 (six) hours as needed for nausea.         . traZODone (DESYREL) 100 MG tablet   Oral   Take 200 mg by mouth at bedtime.         Marland Kitchen zonisamide (ZONEGRAN) 100 MG capsule   Oral   Take 200 mg by mouth 2 (two) times daily.         . fluconazole (DIFLUCAN) 200 MG tablet   Oral   Take 1 tablet (200 mg total) by mouth daily.   7 tablet   0    BP 104/56  Pulse 67  Temp(Src) 97.9 F (36.6 C) (Oral)  Resp 20  Ht 5\' 3"  (1.6 m)  Wt 165 lb (74.844 kg)  BMI 29.24 kg/m2  SpO2 96%  LMP 05/30/2011 Physical Exam  Nursing note and vitals reviewed. Constitutional: She is oriented to person, place, and time. She appears well-developed and well-nourished. No distress.  Patient found sleeping, laying in bed comfortably while this provider walked into the room. Patient unable to keep her eyes open during physical exam and interview- patient continues to request pain medications. Speech groggy and mumbling - denied alcohol, reported she took her pm medications that control her pain and help her sleep.   HENT:  Head: Normocephalic and atraumatic.  Mouth/Throat: Oropharynx is clear and moist. No oropharyngeal exudate.  Eyes: Conjunctivae and EOM are normal. Pupils are equal, round, and reactive to light. Right eye exhibits no discharge. Left eye exhibits no discharge.  Neck: Normal range of motion. Neck supple.  Negative neck stiffness Negative nuchal rigidity Negative cervical LAD Negative meningeal signs  Cardiovascular: Normal rate, regular rhythm and normal heart sounds.  Exam reveals no friction rub.   No murmur heard. Pulses:      Radial pulses are 2+ on the right side, and 2+ on the left side.       Dorsalis pedis pulses are 2+ on the right side, and 2+ on the  left side.  Pulmonary/Chest: No respiratory distress. She has wheezes. She has no rales. She exhibits no tenderness.  Inspiratory and expiratory wheezes identified to upper and lower lobes bilaterally, patient has history of asthma and COPD.  Abdominal: Soft. Bowel sounds are normal. She exhibits no distension. There is tenderness. There is no rebound.  Discomfort upon palpation to left upper quadrant and left side of the abdomen.  Negative suprapubic tenderness identified. Negative acute abdomen, negative peritoneal signs  Genitourinary: Vaginal discharge found.  Negative swelling, erythema, inflammation, lesions, sores, masses noted to the external genitalia and vaginal canal. Negative blood in the vaginal vault. Mild thick white discharge noted, negative odor. Cervix negative inflammation, swelling, erythema, friability noted. Negative CMT, negative bilateral adnexal tenderness.  Exam chaperoned with RN  Musculoskeletal: Normal range of motion.  Full ROM to upper and lower extremities bilaterally  Lymphadenopathy:    She has no cervical adenopathy.  Neurological: She is alert and oriented to person, place, and time. She exhibits normal muscle tone. Coordination normal.  Skin: Skin is warm and dry. No rash noted. She is not diaphoretic. No erythema.    ED Course  Procedures (including critical care time)  09/21/2013 2:05 AM This provider checked up on patient. Patient sleeping in bed, not easy to arouse. Sternal rub performed and patient wakes up. Patient appears to be on some type of medication, patient out of it and speech is groggy and continues to mumble.   Labs Review Labs Reviewed  WET PREP, GENITAL - Abnormal; Notable for the following:    Yeast Wet Prep HPF POC FEW (*)    WBC, Wet Prep HPF POC RARE (*)    All other components within normal limits  URINALYSIS, ROUTINE W REFLEX MICROSCOPIC - Abnormal; Notable for the following:    Leukocytes, UA SMALL (*)    All other  components within normal limits  GC/CHLAMYDIA PROBE AMP  URINE MICROSCOPIC-ADD ON  URINE RAPID DRUG SCREEN (HOSP PERFORMED)  URINE RAPID DRUG SCREEN (HOSP PERFORMED)   Imaging Review Dg Chest 2 View  09/21/2013   CLINICAL DATA:  Shortness of Breath.  EXAM: CHEST - 2 VIEW  COMPARISON:  09/06/2010  FINDINGS: The heart size and mediastinal contours are within normal limits. Both lungs are clear. The visualized skeletal structures are unremarkable. No effusion.  IMPRESSION: No acute cardiopulmonary disease.   Electronically Signed   By: Oley Balm M.D.   On: 09/21/2013 00:24   US Abdomen Complete  09/21/2013   CLINICAL DATA:  Pain. Previous cholecystectomy.  EXAM: COMPLETE ABDOMINAL ULTRASOUND  COMPARISON:  CT 12/01/2009  FINDINGS: Gallbladder:  Surgically absent  Common bile duct:  Normal in caliber, 6.51mm diameter.  Liver: Homogeneous in echotexture without focal lesion or intrahepatic bile duct dilatation.  IVC:  Negative  Pancreas:  Negative  Spleen:  No focal lesion, 6.4cm in length.  Right Kidney:  No mass or hydronephrosis, 11.3cm in length.  Left Kidney:  No lesion or hydronephrosis, and 0.9cm in length.  Abdominal aorta:  Negative  IMPRESSION: Negative.  Normal gallbladder.   Electronically Signed   By: Oley Balm M.D.   On: 09/21/2013 01:13    EKG Interpretation   None       MDM   1. Yeast infection   2. COPD (chronic obstructive pulmonary disease)   3. Shortness of breath    Patient presenting to emergency department with abdominal pain localized to the left side described as a crampy sensation along with burning during urination. Patient reports she was treated with nitrofurantoin, reported that she finished the medications 2 days ago. Patient reports she continues to experience dysuria. Reported that she's been experiencing vaginal discharge the past couple of days. Alert and oriented. Full range of motion to upper and lower extremities bilaterally. Heart rate and  rhythm normal. Lungs with expiratory and inspiratory wheezes identified to upper and lower lobes bilaterally. Pulses palpable and  strong, radial and DP bilaterally. Pelvic exam noted thick white discharge, no odor identified - suspicion to be Candida vulvovaginitis. Patient sleeping throughout most the interview and physical exam-appears to be on some sort of medication that makes the patient extremely sleepy. Patient continues to fall asleep.  Wet prep noted few yeast cells. Urinalysis negative for infection, small amount of leukocytes identified, negative pyuria identified. Urine drug screen negative. Ultrasound abdomen negative findings. Chest x-ray negative acute cardiopulmonary disease noted. Patient placed on 3 L of oxygen via nasal cannula. Patient resting comfortably in bed, sleeping. Patient to be monitored. Patient cannot be discharged based on presentation. Discussed with Junius Finner PA-C to monitor patient, to have her sober up, ambulate her with pulse oximetry reading and if patient okay to be discharged, if anything changes patient is to then be re-assessed and admitted if needed. Transfer of care to Junius Finner, PA-C at change in shift.     Raymon Mutton, PA-C 09/21/13 1732

## 2013-09-20 NOTE — ED Notes (Signed)
Patient is alert and oriented x3.  She is complaining of Lower left side, lower left abdominal and groin pain that started 3 weeks ago with a UTI and she has completed antibiotics with no relief.

## 2013-09-20 NOTE — ED Notes (Signed)
Pt reports she was treated for an UTI, was given an ABX and states upon finishing this she began having more cramping to her L side. Pt also reports burning with urination, frequency and urgency with scant amount of urine.

## 2013-09-20 NOTE — ED Notes (Signed)
Pt now reporting ShOB, has not been using her breathing treatments at home for asthma.

## 2013-09-21 ENCOUNTER — Emergency Department (HOSPITAL_COMMUNITY): Payer: Medicaid Other

## 2013-09-21 LAB — RAPID URINE DRUG SCREEN, HOSP PERFORMED
Amphetamines: NOT DETECTED
Cocaine: NOT DETECTED
Opiates: NOT DETECTED
Tetrahydrocannabinol: NOT DETECTED

## 2013-09-21 LAB — GC/CHLAMYDIA PROBE AMP: GC Probe RNA: NEGATIVE

## 2013-09-21 LAB — WET PREP, GENITAL: Clue Cells Wet Prep HPF POC: NONE SEEN

## 2013-09-21 MED ORDER — FLUCONAZOLE 200 MG PO TABS: 200.0000 mg | ORAL_TABLET | Freq: Every day | ORAL | Status: DC

## 2013-09-21 MED ORDER — ALBUTEROL SULFATE (5 MG/ML) 0.5% IN NEBU: 5.0000 mg | INHALATION_SOLUTION | Freq: Once | RESPIRATORY_TRACT | Status: DC

## 2013-09-21 NOTE — ED Provider Notes (Signed)
Pt signed out to me by Kathrynn Humble, PA-C at shift change. Plan is to allow pt to wake up, get ambulatory pulse-ox and discharged home is stable.  Discharge papers already completed by Sciatca PA-C in anticipation pt will be going home.   5:58 AM  Pt is awake and asking to be sent home.  Will get ambulatory O2.  Pt was able to keep O2 stats about 92% and was asymptomatic while walking. Will discharge home to f/u with PCP. Return precautions provided. Pt verbalized understanding and agreement with discharge plan.   Junius Finner, PA-C 09/21/13 [phone]

## 2013-09-21 NOTE — ED Notes (Signed)
Pt ambulated; tolerated well; O2 Sat = 92

## 2013-09-21 NOTE — ED Notes (Signed)
Patient is alert and oriented x3.  She was given DC instructions and follow up visit instructions.  Patient gave verbal understanding. She was DC ambulatory under her own power to home.  V/S stable.  He was not showing any signs of distress on DC 

## 2013-09-23 NOTE — ED Provider Notes (Signed)
Medical screening examination/treatment/procedure(s) were performed by non-physician practitioner and as supervising physician I was immediately available for consultation/collaboration.    Christopher J. Pollina, MD 09/23/13 1527 

## 2013-09-23 NOTE — ED Provider Notes (Signed)
Medical screening examination/treatment/procedure(s) were performed by non-physician practitioner and as supervising physician I was immediately available for consultation/collaboration.    Gilda Crease, MD 09/23/13 [phone]

## 2014-02-12 ENCOUNTER — Encounter (HOSPITAL_COMMUNITY): Payer: Self-pay | Admitting: Emergency Medicine

## 2014-02-12 ENCOUNTER — Emergency Department (HOSPITAL_COMMUNITY)
Admission: EM | Admit: 2014-02-12 | Discharge: 2014-02-13 | Disposition: A | Discharge status: Home / Self Care | Payer: Medicaid Other | Attending: Emergency Medicine | Admitting: Emergency Medicine

## 2014-02-12 DIAGNOSIS — J449 Chronic obstructive pulmonary disease, unspecified: Secondary | ICD-10-CM | POA: Insufficient documentation

## 2014-02-12 DIAGNOSIS — R32 Unspecified urinary incontinence: Secondary | ICD-10-CM | POA: Insufficient documentation

## 2014-02-12 DIAGNOSIS — Z8709 Personal history of other diseases of the respiratory system: Secondary | ICD-10-CM | POA: Insufficient documentation

## 2014-02-12 DIAGNOSIS — IMO0002 Reserved for concepts with insufficient information to code with codable children: Secondary | ICD-10-CM | POA: Insufficient documentation

## 2014-02-12 DIAGNOSIS — M4120 Other idiopathic scoliosis, site unspecified: Secondary | ICD-10-CM | POA: Insufficient documentation

## 2014-02-12 DIAGNOSIS — J4489 Other specified chronic obstructive pulmonary disease: Secondary | ICD-10-CM | POA: Insufficient documentation

## 2014-02-12 DIAGNOSIS — G8929 Other chronic pain: Secondary | ICD-10-CM | POA: Insufficient documentation

## 2014-02-12 DIAGNOSIS — F17200 Nicotine dependence, unspecified, uncomplicated: Secondary | ICD-10-CM | POA: Insufficient documentation

## 2014-02-12 DIAGNOSIS — R197 Diarrhea, unspecified: Secondary | ICD-10-CM | POA: Insufficient documentation

## 2014-02-12 DIAGNOSIS — M549 Dorsalgia, unspecified: Secondary | ICD-10-CM

## 2014-02-12 DIAGNOSIS — Z79899 Other long term (current) drug therapy: Secondary | ICD-10-CM | POA: Insufficient documentation

## 2014-02-12 MED ORDER — OXYCODONE HCL 5 MG PO TABS
15.0000 mg | ORAL_TABLET | Freq: Once | ORAL | Status: AC
  Administered 2014-02-13: 15 mg via ORAL
  Filled 2014-02-12: qty 3

## 2014-02-12 MED ORDER — OXYCODONE HCL 15 MG PO TABS: 15.0000 mg | ORAL_TABLET | ORAL | Status: DC | PRN

## 2014-02-12 NOTE — ED Notes (Signed)
Patient states she was being seen at [facility] and was discharged as a patient from the practice, as the office was unable to get in touch with patient. Patient reports chronic back pain, was taking 5-15 mg Oxycodone daily and has been without medications since 02/05/14.

## 2014-02-12 NOTE — Discharge Instructions (Signed)
Chronic Back Pain   When back pain lasts longer than 3 months, it is called chronic back pain.People with chronic back pain often go through certain periods that are more intense (flare-ups).   CAUSES  Chronic back pain can be caused by wear and tear (degeneration) on different structures in your back. These structures include:   The bones of your spine (vertebrae) and the joints surrounding your spinal cord and nerve roots (facets).   The strong, fibrous tissues that connect your vertebrae (ligaments).  Degeneration of these structures may result in pressure on your nerves. This can lead to constant pain.  HOME CARE INSTRUCTIONS   Avoid bending, heavy lifting, prolonged sitting, and activities which make the problem worse.   Take brief periods of rest throughout the day to reduce your pain. Lying down or standing usually is better than sitting while you are resting.   Take over-the-counter or prescription medicines only as directed by your caregiver.  SEEK IMMEDIATE MEDICAL CARE IF:    You have weakness or numbness in one of your legs or feet.   You have trouble controlling your bladder or bowels.   You have nausea, vomiting, abdominal pain, shortness of breath, or fainting.  Document Released: 12/18/2004 Document Revised: 02/02/2012 Document Reviewed: 10/25/2011  [employer] Patient Information 2014 [employer]

## 2014-02-12 NOTE — ED Provider Notes (Signed)
CSN: [id number]     Arrival date & time 02/12/14  2055 History   First MD Initiated Contact with Patient 02/12/14 2221    This chart was scribed for non-physician practitioner, Antonietta Breach, [place], working with Julianne Rice, MD by Terressa Koyanagi, ED Scribe. This patient was seen in room [place] and the patient's care was started at 11:42 PM.  Chief Complaint  Patient presents with  . Back Pain    chronic, off meds since 3/15   HPI HPI Comments: [name] is a 48 y.o. female, with a history of COPD, chronic back pain, scoliosis who presents to the Emergency Department for chronic backpain. Pt reports she has been managed by pain management, [facility], for the past couple of years. She reports she has been taking 15mg  of oxycodone 5x a day for her chronic back pain for several years. However, she was discharged as a pt from [facility] on 02/05/14 and she has been without pain meds since then. Pt denies no new injuries to her back. Pt states she has not been taking any other meds since she ran out of her oxycodone on 02/05/14 to help alleviate her pain. Pt denies bowel/bladder incontinence and genital or perianal numbness. Pt denies any history of IV drug use or history of cancer.    Pt is a smoker who smokes 1 ppd.   Past Medical History  Diagnosis Date  . Asthma   . COPD (chronic obstructive pulmonary disease)   . Back pain   . Scoliosis    Past Surgical History  Procedure Laterality Date  . Cholecystectomy     No family history on file. History  Substance Use Topics  . Smoking status: Current Every Day Smoker -- 1.00 packs/day    Types: Cigarettes  . Smokeless tobacco: Never Used  . Alcohol Use: No   OB History   Grav Para Term Preterm Abortions TAB SAB Ect Mult Living                 Review of Systems  Gastrointestinal: Positive for diarrhea. Negative for vomiting.  Genitourinary:       Urinary incontinence.  Musculoskeletal: Positive for back  pain.  Neurological: Negative for numbness (No numbness in genital area).  All other systems reviewed and are negative.   Allergies  Paxil and Wellbutrin  Home Medications   Current Outpatient Rx  Name  Route  Sig  Dispense  Refill  . acetaminophen (TYLENOL) 500 MG tablet   Oral   Take 500 mg by mouth every 6 (six) hours as needed for mild pain or headache.         . albuterol (PROVENTIL,VENTOLIN) 90 MCG/ACT inhaler   Inhalation   Inhale 2 puffs into the lungs every 6 (six) hours as needed for wheezing or shortness of breath. For shortness of breath         . budesonide-formoterol (SYMBICORT) 160-4.5 MCG/ACT inhaler   Inhalation   Inhale 2 puffs into the lungs 2 (two) times daily.           Marland Kitchen gabapentin (NEURONTIN) 400 MG capsule   Oral   Take 800-1,600 mg by mouth 3 (three) times daily. 800mg  in the morning, 800mg  in the evening, and 1600mg  at bedtime         . ALPRAZolam (XANAX) 1 MG tablet   Oral   Take 1 mg by mouth 3 (three) times daily as needed for anxiety.         Marland Kitchen  escitalopram (LEXAPRO) 20 MG tablet   Oral   Take 40 mg by mouth daily.          Marland Kitchen ibuprofen (ADVIL,MOTRIN) 800 MG tablet   Oral   Take 800 mg by mouth every 8 (eight) hours as needed for pain.         Marland Kitchen loratadine (CLARITIN) 10 MG tablet   Oral   Take 10 mg by mouth daily.           . methocarbamol (ROBAXIN) 750 MG tablet   Oral   Take 750 mg by mouth 4 (four) times daily.          Marland Kitchen oxyCODONE (ROXICODONE) 15 MG immediate release tablet   Oral   Take 1 tablet (15 mg total) by mouth every 4 (four) hours as needed for pain.   3 tablet   0   . promethazine (PHENERGAN) 25 MG tablet   Oral   Take 25 mg by mouth every 6 (six) hours as needed for nausea.         . traZODone (DESYREL) 100 MG tablet   Oral   Take 200 mg by mouth at bedtime.         Marland Kitchen zonisamide (ZONEGRAN) 100 MG capsule   Oral   Take 200 mg by mouth 2 (two) times daily.          BP 142/84  Pulse  80  Temp(Src) 98.1 F (36.7 C) (Oral)  Resp 20  Ht 5\' 3"  (1.6 m)  Wt 155 lb (70.308 kg)  BMI 27.46 kg/m2  SpO2 99%  LMP 05/30/2011  Physical Exam  Nursing note and vitals reviewed. Constitutional: She is oriented to person, place, and time. She appears well-developed and well-nourished. No distress.  HENT:  Head: Normocephalic and atraumatic.  Eyes: Conjunctivae and EOM are normal. No scleral icterus.  Neck: Normal range of motion.  Pulmonary/Chest: Effort normal. No respiratory distress.  Musculoskeletal: Normal range of motion.  No bony deformities or step-offs palpated. Normal range of motion of back appreciated. Tenderness to palpation of bilateral lumbar paraspinal muscles.  Neurological: She is alert and oriented to person, place, and time. She has normal reflexes. She exhibits normal muscle tone.  Patient ambulates with normal gait. No gross sensory deficits appreciated. Patient moves extremities without ataxia.  Skin: Skin is warm and dry. No rash noted. She is not diaphoretic. No erythema. No pallor.  Psychiatric: She has a normal mood and affect. Her behavior is normal.    ED Course  Procedures (including critical care time)  DIAGNOSTIC STUDIES: Oxygen Saturation is 99% on room air, normal by my interpretation.    11:49 PM-Discussed treatment plan which includes giving one dose of pain meds with pt at bedside and pt agreed to plan.   Labs Review Labs Reviewed - No data to display Imaging Review No results found.   EKG Interpretation None      MDM   Final diagnoses:  Chronic back pain    Uncomplicated chronic back pain. Patient denies new trauma or injury. Denies cancer or IV drug use history. No red flags or signs concerning for cauda equina. Patient neurovascularly intact and ambulatory with normal gait. No sensory deficits appreciated. Patient advised to followup with her primary care doctor for the management of her chronic pain until she is able to  followup with another pain management clinic. Patient treated in ED with 1 tablet 15 mg oxycodone. She will be discharged with 3 tablets of same to  get her through to her primary care appointment in the morning. Controlled substance database reviewed which shows patient has not had any narcotic prescriptions filled since 01/06/2014; this was a 30 day supply. It has been stressed to the patient that the emergency department is not the appropriate place for the management of chronic pain. Return precautions provided and patient agreeable to plan with no unaddressed concerns.  I personally performed the services described in this documentation, which was scribed in my presence. The recorded information has been reviewed and is accurate.     Antonietta Breach, PA-C 02/13/14 0005

## 2014-02-13 NOTE — ED Provider Notes (Signed)
Medical screening examination/treatment/procedure(s) were performed by non-physician practitioner and as supervising physician I was immediately available for consultation/collaboration.   EKG Interpretation None        Jasdeep Kepner, MD 02/13/14 0555 

## 2014-03-02 ENCOUNTER — Encounter: Payer: Self-pay | Admitting: *Deleted

## 2014-03-02 ENCOUNTER — Encounter: Payer: Self-pay | Admitting: Neurology

## 2014-03-06 ENCOUNTER — Ambulatory Visit (INDEPENDENT_AMBULATORY_CARE_PROVIDER_SITE_OTHER): Payer: Medicaid Other | Admitting: Neurology

## 2014-03-06 ENCOUNTER — Encounter: Payer: Self-pay | Admitting: Neurology

## 2014-03-06 VITALS — BP 132/79 | HR 79 | Ht 63.0 in | Wt 159.0 lb

## 2014-03-06 DIAGNOSIS — M545 Low back pain, unspecified: Secondary | ICD-10-CM

## 2014-03-06 DIAGNOSIS — G8929 Other chronic pain: Secondary | ICD-10-CM | POA: Insufficient documentation

## 2014-03-06 DIAGNOSIS — M459 Ankylosing spondylitis of unspecified sites in spine: Secondary | ICD-10-CM

## 2014-03-06 HISTORY — DX: Low back pain, unspecified: M54.50

## 2014-03-06 MED ORDER — OXYCODONE HCL 15 MG PO TABS: 15.0000 mg | ORAL_TABLET | Freq: Four times a day (QID) | ORAL | Status: DC | PRN

## 2014-03-06 NOTE — Patient Instructions (Signed)

## 2014-03-06 NOTE — Progress Notes (Signed)
Reason for visit: Chronic pain syndrome  [name] is a 48 y.o. female  History of present illness:  Ms. Maiden is a 48 year old right-handed white female with a history she claims of rheumatoid arthritis. The patient has been taking Motrin for this, without benefit. The patient otherwise has not been on any medications for rheumatoid arthritis. The patient reports pain throughout the body. The patient mainly complains of low back pain, with pain down both legs to the feet with numbness down the legs, claiming that the left leg is worse than the right. The patient indicates that she has scoliosis, and she has neck discomfort, shoulder pain, elbow pain, pain in the wrists and hands. The patient has pain in the knees and in the feet. The patient reports that she feels weak all over. The patient reports some issues controlling the bladder, and she also has alternating diarrhea and constipation. The patient denies any recent falls, has some mild gait instability. The patient has been on oxycodone taking 15 mg 4 times daily, but she ran out of the medication several weeks ago. The patient had been followed by Dr. Macario Carls previously, but she is no longer followed through that practice. The patient is sent to this office for further management. The patient indicates that she has had a recent MRI lumbosacral spine within the last year, and the results of this test are not available to me. The patient has never had EMG and nerve conduction studies of the lower extremities. The patient indicates that she has undergone epidural steroid injections previously without benefit. The patient has never been followed through a pain center.  Past Medical History  Diagnosis Date  . Asthma   . COPD (chronic obstructive pulmonary disease)   . Back pain   . Scoliosis   . Hypertension   . Arthritis   . Bipolar 1 disorder   . Depression   . Fibroid   . Migraine   . Chronic low back pain 03/06/2014    Past  Surgical History  Procedure Laterality Date  . Cholecystectomy    . Tubal ligation Bilateral     Family History  Problem Relation Age of Onset  . Stroke Mother   . Hypertension Brother   . Hypertension Brother   . Hypertension Brother     Social history:  reports that she has been smoking Cigarettes.  She has been smoking about 1.00 pack per day. She has never used smokeless tobacco. She reports that she does not drink alcohol or use illicit drugs.  Medications:  Current Outpatient Prescriptions on File Prior to Visit  Medication Sig Dispense Refill  . acetaminophen (TYLENOL) 500 MG tablet Take 500 mg by mouth every 6 (six) hours as needed for mild pain or headache.      . albuterol (PROVENTIL,VENTOLIN) 90 MCG/ACT inhaler Inhale 2 puffs into the lungs every 6 (six) hours as needed for wheezing or shortness of breath. For shortness of breath      . ALPRAZolam (XANAX) 1 MG tablet Take 1 mg by mouth 3 (three) times daily as needed for anxiety.      . budesonide-formoterol (SYMBICORT) 160-4.5 MCG/ACT inhaler Inhale 2 puffs into the lungs 2 (two) times daily.        Marland Kitchen escitalopram (LEXAPRO) 20 MG tablet Take 40 mg by mouth daily.       Marland Kitchen gabapentin (NEURONTIN) 400 MG capsule Take 800-1,600 mg by mouth 3 (three) times daily. 800mg  in the morning, 800mg  in  the evening, and 1600mg  at bedtime      . ibuprofen (ADVIL,MOTRIN) 800 MG tablet Take 800 mg by mouth every 8 (eight) hours as needed for pain.      Marland Kitchen loratadine (CLARITIN) 10 MG tablet Take 10 mg by mouth daily.        . methocarbamol (ROBAXIN) 750 MG tablet Take 750 mg by mouth 4 (four) times daily.       . promethazine (PHENERGAN) 25 MG tablet Take 25 mg by mouth every 6 (six) hours as needed for nausea.      . traZODone (DESYREL) 100 MG tablet Take 200 mg by mouth at bedtime.      Marland Kitchen zonisamide (ZONEGRAN) 100 MG capsule Take 200 mg by mouth 2 (two) times daily.       No current facility-administered medications on file prior to visit.        Allergies  Allergen Reactions  . Paxil [Paroxetine Hcl] Other (See Comments)    Mean and aggresive  . Wellbutrin [Bupropion] Other (See Comments)    Rapid heartrate    ROS:  Out of a complete 14 system review of symptoms, the patient complains only of the following symptoms, and all other reviewed systems are negative.  Fatigue Swelling the legs Ringing in the ears Blurred vision Wheezing, snoring Diarrhea, constipation Urination problems Easy bruising Increased thirst Joint pain, joint swelling, muscle cramps, achy muscles Allergies, runny nose Memory loss, confusion, headache Depression, anxiety, not enough sleep, decreased energy, change in appetite, disinterest in activities Insomnia  Blood pressure 132/79, pulse 79, height 5\' 3"  (1.6 m), weight 159 lb (72.122 kg), last menstrual period 05/30/2011.  Physical Exam  General: The patient is alert and cooperative at the time of the examination.  Eyes: Pupils are equal, round, and reactive to light. Discs are flat bilaterally.  Neck: The neck is supple, no carotid bruits are noted.  Respiratory: The respiratory examination is clear.  Cardiovascular: The cardiovascular examination reveals a regular rate and rhythm, no obvious murmurs or rubs are noted.  Neuromuscular: The patient is only able to flex about 30 with the low back.  Skin: Extremities are without significant edema.  Neurologic Exam  Mental status: The patient is alert and oriented x 3 at the time of the examination. The patient has apparent normal recent and remote memory, with an apparently normal attention span and concentration ability.  Cranial nerves: Facial symmetry is present. There is good sensation of the face to pinprick and soft touch bilaterally. The strength of the facial muscles and the muscles to head turning and shoulder shrug are normal bilaterally. Speech is well enunciated, no aphasia or dysarthria is noted. Extraocular movements  are full. Visual fields are full. The tongue is midline, and the patient has symmetric elevation of the soft palate. No obvious hearing deficits are noted.  Motor: The motor testing reveals 5 over 5 strength of all 4 extremities. Good symmetric motor tone is noted throughout.  Sensory: Sensory testing is intact to pinprick, soft touch, vibration sensation, and position sense on all 4 extremities, with exception of decreased pinprick sensation in both legs reported. No evidence of extinction is noted.  Coordination: Cerebellar testing reveals good finger-nose-finger and heel-to-shin bilaterally.  Gait and station: Gait is normal. Tandem gait is slightly unsteady. Romberg is negative. No drift is seen.  Reflexes: Deep tendon reflexes are symmetric and normal bilaterally. Toes are downgoing bilaterally.   Assessment/Plan:  1. Chronic low back pain  2. Reported history of  rheumatoid arthritis  The patient has an objectively normal examination, but she complains of low back pain. The patient will be set up for nerve conduction studies on both legs, EMG evaluation of both legs. We will try to obtain the MRI report of the lumbosacral spine from Dr. Macario Carls. The patient may be best served by being managed through a pain center. The patient will have blood work done. If the patient does have rheumatoid arthritis, a rheumatology referral may be indicated. Treatment of the rheumatoid arthritis, if this is present, may improve the low back pain and joint pain.  Jill Alexanders MD 03/06/2014 6:56 PM  [facility] [address] [place], [place] [address]  Phone [phone] Fax [phone]

## 2014-03-07 LAB — SEDIMENTATION RATE: Sed Rate: 2 mm/hr (ref 0–32)

## 2014-03-07 LAB — ANA W/REFLEX: Anti Nuclear Antibody(ANA): NEGATIVE

## 2014-03-07 LAB — RHEUMATOID FACTOR: Rhuematoid fact SerPl-aCnc: 7.3 IU/mL (ref 0.0–13.9)

## 2014-03-07 NOTE — Progress Notes (Signed)
Quick Note:  Called to share unremarkable results with patient , verbalized understanding ______

## 2014-03-11 ENCOUNTER — Emergency Department (HOSPITAL_COMMUNITY)
Admission: EM | Admit: 2014-03-11 | Discharge: 2014-03-11 | Disposition: A | Discharge status: Home / Self Care | Payer: Medicaid Other | Attending: Emergency Medicine | Admitting: Emergency Medicine

## 2014-03-11 ENCOUNTER — Encounter (HOSPITAL_COMMUNITY): Payer: Self-pay | Admitting: Emergency Medicine

## 2014-03-11 DIAGNOSIS — J4489 Other specified chronic obstructive pulmonary disease: Secondary | ICD-10-CM | POA: Insufficient documentation

## 2014-03-11 DIAGNOSIS — M79609 Pain in unspecified limb: Secondary | ICD-10-CM | POA: Insufficient documentation

## 2014-03-11 DIAGNOSIS — I10 Essential (primary) hypertension: Secondary | ICD-10-CM | POA: Insufficient documentation

## 2014-03-11 DIAGNOSIS — G8929 Other chronic pain: Secondary | ICD-10-CM

## 2014-03-11 DIAGNOSIS — M545 Low back pain, unspecified: Secondary | ICD-10-CM | POA: Insufficient documentation

## 2014-03-11 DIAGNOSIS — Z79899 Other long term (current) drug therapy: Secondary | ICD-10-CM | POA: Insufficient documentation

## 2014-03-11 DIAGNOSIS — Z8742 Personal history of other diseases of the female genital tract: Secondary | ICD-10-CM | POA: Insufficient documentation

## 2014-03-11 DIAGNOSIS — G43909 Migraine, unspecified, not intractable, without status migrainosus: Secondary | ICD-10-CM | POA: Insufficient documentation

## 2014-03-11 DIAGNOSIS — M129 Arthropathy, unspecified: Secondary | ICD-10-CM | POA: Insufficient documentation

## 2014-03-11 DIAGNOSIS — F319 Bipolar disorder, unspecified: Secondary | ICD-10-CM | POA: Insufficient documentation

## 2014-03-11 DIAGNOSIS — J449 Chronic obstructive pulmonary disease, unspecified: Secondary | ICD-10-CM | POA: Insufficient documentation

## 2014-03-11 DIAGNOSIS — F17200 Nicotine dependence, unspecified, uncomplicated: Secondary | ICD-10-CM | POA: Insufficient documentation

## 2014-03-11 DIAGNOSIS — M549 Dorsalgia, unspecified: Secondary | ICD-10-CM

## 2014-03-11 DIAGNOSIS — R209 Unspecified disturbances of skin sensation: Secondary | ICD-10-CM | POA: Insufficient documentation

## 2014-03-11 MED ORDER — HYDROMORPHONE HCL PF 1 MG/ML IJ SOLN
1.0000 mg | Freq: Once | INTRAMUSCULAR | Status: AC
Start: 2014-03-11 — End: 2014-03-11
  Administered 2014-03-11: 1 mg via INTRAMUSCULAR
  Filled 2014-03-11: qty 1

## 2014-03-11 MED ORDER — OXYCODONE-ACETAMINOPHEN 5-325 MG PO TABS: 2.0000 | ORAL_TABLET | ORAL | Status: DC | PRN

## 2014-03-11 NOTE — ED Notes (Signed)
Pt is ambulatory without difficulty.

## 2014-03-11 NOTE — ED Notes (Addendum)
Pt c/o chronic lower back pain x3years and r leg pain x3days, 10/10.  Stated that it hurts to walk and is unable to cross legs because they go numb.  Pt has hx of being seen at the pain clinic.  But due to being unable to contact pt, they would no longer see her.  States "it's been months, possibly February" since last pain clinic visit.  Reports laying in bed all day.

## 2014-03-11 NOTE — Discharge Instructions (Signed)
Chronic Back Pain   When back pain lasts longer than 3 months, it is called chronic back pain.People with chronic back pain often go through certain periods that are more intense (flare-ups).   CAUSES  Chronic back pain can be caused by wear and tear (degeneration) on different structures in your back. These structures include:   The bones of your spine (vertebrae) and the joints surrounding your spinal cord and nerve roots (facets).   The strong, fibrous tissues that connect your vertebrae (ligaments).  Degeneration of these structures may result in pressure on your nerves. This can lead to constant pain.  HOME CARE INSTRUCTIONS   Avoid bending, heavy lifting, prolonged sitting, and activities which make the problem worse.   Take brief periods of rest throughout the day to reduce your pain. Lying down or standing usually is better than sitting while you are resting.   Take over-the-counter or prescription medicines only as directed by your caregiver.  SEEK IMMEDIATE MEDICAL CARE IF:    You have weakness or numbness in one of your legs or feet.   You have trouble controlling your bladder or bowels.   You have nausea, vomiting, abdominal pain, shortness of breath, or fainting.  Document Released: 12/18/2004 Document Revised: 02/02/2012 Document Reviewed: 10/25/2011  [employer] Patient Information 2014 [employer]

## 2014-03-11 NOTE — ED Notes (Signed)
Discharge delayed due to prior narcotic administration.

## 2014-03-11 NOTE — ED Provider Notes (Signed)
CSN: [id number]     Arrival date & time 03/11/14  1312 History   First MD Initiated Contact with Patient 03/11/14 1343     Chief Complaint  Patient presents with  . Back Pain  . Leg Pain     (Consider location/radiation/quality/duration/timing/severity/associated sxs/prior Treatment) HPI Comments: Patient presents with back pain. She has a history of chronic back pain which she states has been better for the last 3-4 years. She complains of pain in her low back going down her right leg. She has some intermittent numbness in her right leg. She states these are symptoms that she's had for years. She denies any symptoms. She denies any recent injuries. She denies any loss of bowel or bladder function. She was previously going to pain management but she states she was discharged from their office. This is in [employer]. She recently saw Dr. Jannifer Franklin with neurology and apparently is waiting to get back in with another pain management clinic. She's currently out of her pain medications.  Patient is a 48 y.o. female presenting with back pain and leg pain.  Back Pain Associated symptoms: leg pain   Associated symptoms: no abdominal pain, no chest pain, no fever, no headaches, no numbness and no weakness   Leg Pain Associated symptoms: back pain   Associated symptoms: no fatigue and no fever     Past Medical History  Diagnosis Date  . Asthma   . COPD (chronic obstructive pulmonary disease)   . Back pain   . Scoliosis   . Hypertension   . Arthritis   . Bipolar 1 disorder   . Depression   . Fibroid   . Migraine   . Chronic low back pain 03/06/2014   Past Surgical History  Procedure Laterality Date  . Cholecystectomy    . Tubal ligation Bilateral    Family History  Problem Relation Age of Onset  . Stroke Mother   . Hypertension Brother   . Hypertension Brother   . Hypertension Brother    History  Substance Use Topics  . Smoking status: Current Every Day Smoker -- 1.00 packs/day   Types: Cigarettes  . Smokeless tobacco: Never Used  . Alcohol Use: No   OB History   Grav Para Term Preterm Abortions TAB SAB Ect Mult Living                 Review of Systems  Constitutional: Negative for fever, chills, diaphoresis and fatigue.  HENT: Negative for congestion, rhinorrhea and sneezing.   Eyes: Negative.   Respiratory: Negative for cough, chest tightness and shortness of breath.   Cardiovascular: Negative for chest pain and leg swelling.  Gastrointestinal: Negative for nausea, vomiting, abdominal pain, diarrhea and blood in stool.  Genitourinary: Negative for frequency, hematuria, flank pain and difficulty urinating.  Musculoskeletal: Positive for back pain. Negative for arthralgias.  Skin: Negative for rash.  Neurological: Negative for dizziness, speech difficulty, weakness, numbness and headaches.      Allergies  Paxil and Wellbutrin  Home Medications   Prior to Admission medications   Medication Sig Start Date End Date Taking? Authorizing Provider  albuterol (PROVENTIL,VENTOLIN) 90 MCG/ACT inhaler Inhale 2 puffs into the lungs every 6 (six) hours as needed for wheezing or shortness of breath. For shortness of breath   Yes Historical Provider, MD  ALPRAZolam Duanne Moron) 1 MG tablet Take 1 mg by mouth 3 (three) times daily as needed for anxiety.   Yes Historical Provider, MD  budesonide-formoterol (SYMBICORT) 160-4.5 MCG/ACT inhaler  Inhale 2 puffs into the lungs 2 (two) times daily.     Yes Historical Provider, MD  escitalopram (LEXAPRO) 20 MG tablet Take 40 mg by mouth every morning.    Yes Historical Provider, MD  gabapentin (NEURONTIN) 400 MG capsule Take 800-1,600 mg by mouth 3 (three) times daily. 800mg  in the morning, 800mg  in the evening, and 1600mg  at bedtime   Yes Historical Provider, MD  ibuprofen (ADVIL,MOTRIN) 800 MG tablet Take 800 mg by mouth every 8 (eight) hours as needed for pain.   Yes Historical Provider, MD  loratadine (CLARITIN) 10 MG tablet Take  10 mg by mouth every morning.    Yes Historical Provider, MD  methocarbamol (ROBAXIN) 750 MG tablet Take 750 mg by mouth 4 (four) times daily.    Yes Historical Provider, MD  omeprazole (PRILOSEC) 20 MG capsule Take 40 mg by mouth 2 (two) times daily before a meal.    Yes Historical Provider, MD  promethazine (PHENERGAN) 25 MG tablet Take 25 mg by mouth every 6 (six) hours as needed for nausea.   Yes Historical Provider, MD  solifenacin (VESICARE) 10 MG tablet Take 10 mg by mouth every morning.    Yes Historical Provider, MD  traZODone (DESYREL) 100 MG tablet Take 200 mg by mouth at bedtime.   Yes Historical Provider, MD  ziprasidone (GEODON) 40 MG capsule Take 80 mg by mouth at bedtime.   Yes Historical Provider, MD  zonisamide (ZONEGRAN) 100 MG capsule Take 200 mg by mouth 2 (two) times daily.   Yes Historical Provider, MD  oxyCODONE (ROXICODONE) 15 MG immediate release tablet Take 1 tablet (15 mg total) by mouth every 6 (six) hours as needed for pain. 03/06/14   Kathrynn Ducking, MD  oxyCODONE-acetaminophen (PERCOCET) 5-325 MG per tablet Take 2 tablets by mouth every 4 (four) hours as needed. 03/11/14   Malvin Johns, MD   BP 134/83  Pulse 80  Temp(Src) 97.4 F (36.3 C) (Oral)  Resp 12  SpO2 98%  LMP 05/30/2011 Physical Exam  Constitutional: She is oriented to person, place, and time. She appears well-developed and well-nourished.  HENT:  Head: Normocephalic and atraumatic.  Eyes: Pupils are equal, round, and reactive to light.  Neck: Normal range of motion. Neck supple.  Cardiovascular: Normal rate, regular rhythm and normal heart sounds.   Pulmonary/Chest: Effort normal and breath sounds normal. No respiratory distress. She has no wheezes. She has no rales. She exhibits no tenderness.  Abdominal: Soft. Bowel sounds are normal. There is no tenderness. There is no rebound and no guarding.  Musculoskeletal: Normal range of motion. She exhibits no edema.  Pain to her lower lumbar spine.  Also pain over the right sciatic nerve. Patellar reflexes are symmetric bilaterally. She has normal sensation and motor function in the legs. Gait is normal.  Lymphadenopathy:    She has no cervical adenopathy.  Neurological: She is alert and oriented to person, place, and time.  Skin: Skin is warm and dry. No rash noted.  Psychiatric: She has a normal mood and affect.    ED Course  Procedures (including critical care time) Labs Review Labs Reviewed - No data to display  Imaging Review No results found.   EKG Interpretation None      MDM   Final diagnoses:  Chronic back pain    Patient is given a small prescription for pain medication and encouraged to followup with her primary care physician for pain management for ongoing pain control.  Malvin Johns, MD 03/11/14 [phone]

## 2014-03-17 ENCOUNTER — Encounter: Payer: Medicaid Other | Admitting: Neurology

## 2014-03-17 ENCOUNTER — Telehealth: Payer: Self-pay | Admitting: Neurology

## 2014-03-17 NOTE — Telephone Encounter (Signed)
This patient did not show for an EMG appointment today.

## 2014-03-20 ENCOUNTER — Telehealth: Payer: Self-pay | Admitting: Neurology

## 2014-03-20 NOTE — Telephone Encounter (Signed)
I called patient. The patient has apparently is concerned about some problems with her head. The patient was seen earlier for low back pain. She did not show for an appointment on April 24. She is to call and reschedule this appointment for the EMG, we will talk about her issues at that time.

## 2014-04-05 ENCOUNTER — Encounter: Payer: Self-pay | Admitting: Neurology

## 2014-04-05 ENCOUNTER — Emergency Department (HOSPITAL_COMMUNITY)
Admission: EM | Admit: 2014-04-05 | Discharge: 2014-04-05 | Disposition: A | Discharge status: Home / Self Care | Payer: Medicaid Other | Source: Home / Self Care | Attending: Emergency Medicine | Admitting: Emergency Medicine

## 2014-04-05 ENCOUNTER — Encounter (HOSPITAL_COMMUNITY): Payer: Self-pay | Admitting: Emergency Medicine

## 2014-04-05 DIAGNOSIS — G8929 Other chronic pain: Secondary | ICD-10-CM

## 2014-04-05 DIAGNOSIS — M549 Dorsalgia, unspecified: Secondary | ICD-10-CM

## 2014-04-05 MED ORDER — KETOROLAC TROMETHAMINE 60 MG/2ML IM SOLN
INTRAMUSCULAR | Status: AC
  Filled 2014-04-05: qty 2

## 2014-04-05 MED ORDER — KETOROLAC TROMETHAMINE 60 MG/2ML IM SOLN
60.0000 mg | Freq: Once | INTRAMUSCULAR | Status: AC
  Administered 2014-04-05: 60 mg via INTRAMUSCULAR

## 2014-04-05 NOTE — ED Provider Notes (Signed)
CSN: [id number]     Arrival date & time 04/05/14  1230 History   First MD Initiated Contact with Patient 04/05/14 1417     Chief Complaint  Patient presents with  . Back Pain   (Consider location/radiation/quality/duration/timing/severity/associated sxs/prior Treatment) Patient is a 48 y.o. female presenting with back pain. The history is provided by the patient. No language interpreter was used.  Back Pain Location:  Lumbar spine Quality:  Aching Radiates to:  Does not radiate Pain severity:  Moderate Pain is:  Same all the time Onset quality:  Gradual Timing:  Constant Progression:  Worsening Chronicity:  Recurrent Worsened by:  Nothing tried Ineffective treatments:  None tried Pt released from pain management at [facility].   Pt here requesting pain medication  Past Medical History  Diagnosis Date  . Asthma   . COPD (chronic obstructive pulmonary disease)   . Back pain   . Scoliosis   . Hypertension   . Arthritis   . Bipolar 1 disorder   . Depression   . Fibroid   . Migraine   . Chronic low back pain 03/06/2014   Past Surgical History  Procedure Laterality Date  . Cholecystectomy    . Tubal ligation Bilateral    Family History  Problem Relation Age of Onset  . Stroke Mother   . Hypertension Brother   . Hypertension Brother   . Hypertension Brother    History  Substance Use Topics  . Smoking status: Current Every Day Smoker -- 1.00 packs/day    Types: Cigarettes  . Smokeless tobacco: Never Used  . Alcohol Use: No   OB History   Grav Para Term Preterm Abortions TAB SAB Ect Mult Living                 Review of Systems  Musculoskeletal: Positive for back pain.  All other systems reviewed and are negative.   Allergies  Paxil and Wellbutrin  Home Medications   Prior to Admission medications   Medication Sig Start Date End Date Taking? Authorizing Provider  ALPRAZolam Duanne Moron) 1 MG tablet Take 1 mg by mouth 3 (three) times daily as needed for  anxiety.   Yes Historical Provider, MD  budesonide-formoterol (SYMBICORT) 160-4.5 MCG/ACT inhaler Inhale 2 puffs into the lungs 2 (two) times daily.     Yes Historical Provider, MD  albuterol (PROVENTIL,VENTOLIN) 90 MCG/ACT inhaler Inhale 2 puffs into the lungs every 6 (six) hours as needed for wheezing or shortness of breath. For shortness of breath    Historical Provider, MD  escitalopram (LEXAPRO) 20 MG tablet Take 40 mg by mouth every morning.     Historical Provider, MD  gabapentin (NEURONTIN) 400 MG capsule Take 800-1,600 mg by mouth 3 (three) times daily. 800mg  in the morning, 800mg  in the evening, and 1600mg  at bedtime    Historical Provider, MD  ibuprofen (ADVIL,MOTRIN) 800 MG tablet Take 800 mg by mouth every 8 (eight) hours as needed for pain.    Historical Provider, MD  loratadine (CLARITIN) 10 MG tablet Take 10 mg by mouth every morning.     Historical Provider, MD  methocarbamol (ROBAXIN) 750 MG tablet Take 750 mg by mouth 4 (four) times daily.     Historical Provider, MD  omeprazole (PRILOSEC) 20 MG capsule Take 40 mg by mouth 2 (two) times daily before a meal.     Historical Provider, MD  oxyCODONE (ROXICODONE) 15 MG immediate release tablet Take 1 tablet (15 mg total) by mouth every 6 (six) hours  as needed for pain. 03/06/14   Kathrynn Ducking, MD  oxyCODONE-acetaminophen (PERCOCET) 5-325 MG per tablet Take 2 tablets by mouth every 4 (four) hours as needed. 03/11/14   Malvin Johns, MD  promethazine (PHENERGAN) 25 MG tablet Take 25 mg by mouth every 6 (six) hours as needed for nausea.    Historical Provider, MD  solifenacin (VESICARE) 10 MG tablet Take 10 mg by mouth every morning.     Historical Provider, MD  traZODone (DESYREL) 100 MG tablet Take 200 mg by mouth at bedtime.    Historical Provider, MD  ziprasidone (GEODON) 40 MG capsule Take 80 mg by mouth at bedtime.    Historical Provider, MD  zonisamide (ZONEGRAN) 100 MG capsule Take 200 mg by mouth 2 (two) times daily.     Historical Provider, MD   BP 133/60  Pulse 80  Temp(Src) 98.2 F (36.8 C) (Oral)  Resp 18  SpO2 97%  LMP 05/30/2011 Physical Exam  Nursing note and vitals reviewed. Constitutional: She is oriented to person, place, and time. She appears well-developed and well-nourished.  HENT:  Head: Normocephalic.  Eyes: EOM are normal. Pupils are equal, round, and reactive to light.  Neck: Normal range of motion.  Pulmonary/Chest: Effort normal.  Abdominal: She exhibits no distension.  Musculoskeletal: Normal range of motion.  Neurological: She is alert and oriented to person, place, and time.  Skin: Skin is warm.  Psychiatric: She has a normal mood and affect.    ED Course  Procedures (including critical care time) Labs Review Labs Reviewed - No data to display  Imaging Review No results found.   MDM   1. Chronic back pain    Torodol IM.   Pt offered alternatives to narcotics.  She refused.   Pt advised to discuss medications with her primary MD   Fransico Meadow, PA-C 04/05/14 1431

## 2014-04-05 NOTE — ED Notes (Signed)
Pt c/o severe chronic back pain that's been going on for 3+++ yrs Reports pain clinic has dropped her??? She is alert w/no signs of acute distress; ambulated well to exam room w/NAD

## 2014-04-05 NOTE — Discharge Instructions (Signed)
Chronic Back Pain   When back pain lasts longer than 3 months, it is called chronic back pain.People with chronic back pain often go through certain periods that are more intense (flare-ups).   CAUSES  Chronic back pain can be caused by wear and tear (degeneration) on different structures in your back. These structures include:   The bones of your spine (vertebrae) and the joints surrounding your spinal cord and nerve roots (facets).   The strong, fibrous tissues that connect your vertebrae (ligaments).  Degeneration of these structures may result in pressure on your nerves. This can lead to constant pain.  HOME CARE INSTRUCTIONS   Avoid bending, heavy lifting, prolonged sitting, and activities which make the problem worse.   Take brief periods of rest throughout the day to reduce your pain. Lying down or standing usually is better than sitting while you are resting.   Take over-the-counter or prescription medicines only as directed by your caregiver.  SEEK IMMEDIATE MEDICAL CARE IF:    You have weakness or numbness in one of your legs or feet.   You have trouble controlling your bladder or bowels.   You have nausea, vomiting, abdominal pain, shortness of breath, or fainting.  Document Released: 12/18/2004 Document Revised: 02/02/2012 Document Reviewed: 10/25/2011  [employer] Patient Information 2014 [employer]

## 2014-04-08 NOTE — ED Provider Notes (Signed)
Medical screening examination/treatment/procedure(s) were performed by non-physician practitioner and as supervising physician I was immediately available for consultation/collaboration.  Philipp Deputy, M.D.  Harden Mo, MD 04/08/14 [phone]

## 2014-04-17 ENCOUNTER — Emergency Department (HOSPITAL_COMMUNITY)
Admission: EM | Admit: 2014-04-17 | Discharge: 2014-04-17 | Disposition: A | Discharge status: Home / Self Care | Payer: Medicaid Other | Attending: Emergency Medicine | Admitting: Emergency Medicine

## 2014-04-17 ENCOUNTER — Encounter (HOSPITAL_COMMUNITY): Payer: Self-pay | Admitting: Emergency Medicine

## 2014-04-17 ENCOUNTER — Emergency Department (HOSPITAL_COMMUNITY): Payer: Medicaid Other

## 2014-04-17 DIAGNOSIS — J45901 Unspecified asthma with (acute) exacerbation: Secondary | ICD-10-CM

## 2014-04-17 DIAGNOSIS — G43909 Migraine, unspecified, not intractable, without status migrainosus: Secondary | ICD-10-CM | POA: Insufficient documentation

## 2014-04-17 DIAGNOSIS — J441 Chronic obstructive pulmonary disease with (acute) exacerbation: Secondary | ICD-10-CM | POA: Insufficient documentation

## 2014-04-17 DIAGNOSIS — F17200 Nicotine dependence, unspecified, uncomplicated: Secondary | ICD-10-CM | POA: Insufficient documentation

## 2014-04-17 DIAGNOSIS — F319 Bipolar disorder, unspecified: Secondary | ICD-10-CM | POA: Insufficient documentation

## 2014-04-17 DIAGNOSIS — IMO0002 Reserved for concepts with insufficient information to code with codable children: Secondary | ICD-10-CM | POA: Insufficient documentation

## 2014-04-17 DIAGNOSIS — M4120 Other idiopathic scoliosis, site unspecified: Secondary | ICD-10-CM | POA: Insufficient documentation

## 2014-04-17 DIAGNOSIS — I10 Essential (primary) hypertension: Secondary | ICD-10-CM | POA: Insufficient documentation

## 2014-04-17 DIAGNOSIS — R0789 Other chest pain: Secondary | ICD-10-CM | POA: Insufficient documentation

## 2014-04-17 DIAGNOSIS — R1013 Epigastric pain: Secondary | ICD-10-CM | POA: Insufficient documentation

## 2014-04-17 DIAGNOSIS — G8929 Other chronic pain: Secondary | ICD-10-CM | POA: Insufficient documentation

## 2014-04-17 DIAGNOSIS — R002 Palpitations: Secondary | ICD-10-CM | POA: Insufficient documentation

## 2014-04-17 DIAGNOSIS — M129 Arthropathy, unspecified: Secondary | ICD-10-CM | POA: Insufficient documentation

## 2014-04-17 DIAGNOSIS — Z8742 Personal history of other diseases of the female genital tract: Secondary | ICD-10-CM | POA: Insufficient documentation

## 2014-04-17 DIAGNOSIS — Z79899 Other long term (current) drug therapy: Secondary | ICD-10-CM | POA: Insufficient documentation

## 2014-04-17 LAB — CBC WITH DIFFERENTIAL/PLATELET
Basophils Absolute: 0.1 10*3/uL (ref 0.0–0.1)
Basophils Relative: 1 % (ref 0–1)
Eosinophils Absolute: 0.1 10*3/uL (ref 0.0–0.7)
Eosinophils Relative: 1 % (ref 0–5)
HCT: 34.9 % — ABNORMAL LOW (ref 36.0–46.0)
HEMOGLOBIN: 12 g/dL (ref 12.0–15.0)
LYMPHS ABS: 1.2 10*3/uL (ref 0.7–4.0)
Lymphocytes Relative: 15 % (ref 12–46)
MCH: 32 pg (ref 26.0–34.0)
MCHC: 34.4 g/dL (ref 30.0–36.0)
MCV: 93.1 fL (ref 78.0–100.0)
MONOS PCT: 10 % (ref 3–12)
Monocytes Absolute: 0.7 10*3/uL (ref 0.1–1.0)
NEUTROS PCT: 73 % (ref 43–77)
Neutro Abs: 5.8 10*3/uL (ref 1.7–7.7)
PLATELETS: 323 10*3/uL (ref 150–400)
RBC: 3.75 MIL/uL — AB (ref 3.87–5.11)
RDW: 15 % (ref 11.5–15.5)
WBC: 7.8 10*3/uL (ref 4.0–10.5)

## 2014-04-17 LAB — COMPREHENSIVE METABOLIC PANEL
ALK PHOS: 45 U/L (ref 39–117)
ALT: 11 U/L (ref 0–35)
AST: 14 U/L (ref 0–37)
Albumin: 4 g/dL (ref 3.5–5.2)
BILIRUBIN TOTAL: 0.2 mg/dL — AB (ref 0.3–1.2)
BUN: 22 mg/dL (ref 6–23)
CO2: 25 meq/L (ref 19–32)
Calcium: 10.5 mg/dL (ref 8.4–10.5)
Chloride: 105 mEq/L (ref 96–112)
Creatinine, Ser: 1.02 mg/dL (ref 0.50–1.10)
GFR, EST AFRICAN AMERICAN: 74 mL/min — AB (ref >90)
GFR, EST NON AFRICAN AMERICAN: 63 mL/min — AB (ref >90)
GLUCOSE: 93 mg/dL (ref 70–99)
POTASSIUM: 4.2 meq/L (ref 3.7–5.3)
Sodium: 143 mEq/L (ref 137–147)
TOTAL PROTEIN: 6.6 g/dL (ref 6.0–8.3)

## 2014-04-17 LAB — TROPONIN I: Troponin I: <0.3 ng/mL (ref <0.30)

## 2014-04-17 LAB — TSH: TSH: 0.892 u[IU]/mL (ref 0.350–4.500)

## 2014-04-17 MED ORDER — LORAZEPAM 1 MG PO TABS
1.0000 mg | ORAL_TABLET | Freq: Once | ORAL | Status: AC
  Administered 2014-04-17: 1 mg via ORAL
  Filled 2014-04-17: qty 1

## 2014-04-17 MED ORDER — KETOROLAC TROMETHAMINE 60 MG/2ML IM SOLN
60.0000 mg | Freq: Once | INTRAMUSCULAR | Status: AC
  Administered 2014-04-17: 60 mg via INTRAMUSCULAR
  Filled 2014-04-17: qty 2

## 2014-04-17 MED ORDER — IBUPROFEN 800 MG PO TABS: 800.0000 mg | ORAL_TABLET | Freq: Three times a day (TID) | ORAL | Status: DC | PRN

## 2014-04-17 MED ORDER — IPRATROPIUM BROMIDE 0.02 % IN SOLN
0.5000 mg | Freq: Once | RESPIRATORY_TRACT | Status: AC
  Administered 2014-04-17: 0.5 mg via RESPIRATORY_TRACT
  Filled 2014-04-17: qty 2.5

## 2014-04-17 MED ORDER — HYDROCODONE-ACETAMINOPHEN 5-325 MG PO TABS
1.0000 | ORAL_TABLET | Freq: Once | ORAL | Status: AC
  Administered 2014-04-17: 1 via ORAL
  Filled 2014-04-17: qty 1

## 2014-04-17 MED ORDER — ALBUTEROL SULFATE (2.5 MG/3ML) 0.083% IN NEBU
5.0000 mg | INHALATION_SOLUTION | Freq: Once | RESPIRATORY_TRACT | Status: AC
  Administered 2014-04-17: 5 mg via RESPIRATORY_TRACT
  Filled 2014-04-17: qty 6

## 2014-04-17 NOTE — ED Notes (Signed)
PA at bedside.
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
## 2014-04-17 NOTE — ED Notes (Addendum)
Pt asked nurse tech can she have pain meds while in triage, nurse tech notified pt that we do not give out pain meds in triage.

## 2014-04-17 NOTE — ED Notes (Addendum)
Pt c/o left sided chest pain x 2 days off and on, states she is dizzy and has a headache. Pt also c/o chronic back pain, reports being recently dropped form pain clinic.

## 2014-04-17 NOTE — Discharge Instructions (Signed)
Read the information below.  Use the prescribed medication as directed.  Please discuss all new medications with your pharmacist.  You may return to the Emergency Department at any time for worsening condition or any new symptoms that concern you.  If you develop worsening chest pain, shortness of breath, fever, you pass out, or become weak or dizzy, return to the ER for a recheck.     Your caregiver has diagnosed you as having chest pain that is not specific for one problem, but does not require admission.  You are at low risk for an acute heart condition or other serious illness. Chest pain comes from many different causes.  SEEK IMMEDIATE MEDICAL ATTENTION IF: You have severe chest pain, especially if the pain is crushing or pressure-like and spreads to the arms, back, neck, or jaw, or if you have sweating, nausea (feeling sick to your stomach), or shortness of breath. THIS IS AN EMERGENCY. Don't wait to see if the pain will go away. Get medical help at once. Call 911 or 0 (operator). DO NOT drive yourself to the hospital.  Your chest pain gets worse and does not go away with rest.  You have an attack of chest pain lasting longer than usual, despite rest and treatment with the medications your caregiver has prescribed.  You wake from sleep with chest pain or shortness of breath.  You feel dizzy or faint.  You have chest pain not typical of your usual pain for which you originally saw your caregiver.   Palpitations  A palpitation is the feeling that your heartbeat is irregular or is faster than normal. It may feel like your heart is fluttering or skipping a beat. Palpitations are usually not a serious problem. However, in some cases, you may need further medical evaluation. CAUSES  Palpitations can be caused by:  Smoking.  Caffeine or other stimulants, such as diet pills or energy drinks.  Alcohol.  Stress and anxiety.  Strenuous physical activity.  Fatigue.  Certain  medicines.  Heart disease, especially if you have a history of arrhythmias. This includes atrial fibrillation, atrial flutter, or supraventricular tachycardia.  An improperly working pacemaker or defibrillator. DIAGNOSIS  To find the cause of your palpitations, your caregiver will take your history and perform a physical exam. Tests may also be done, including:  Electrocardiography (ECG). This test records the heart's electrical activity.  Cardiac monitoring. This allows your caregiver to monitor your heart rate and rhythm in real time.  Holter monitor. This is a portable device that records your heartbeat and can help diagnose heart arrhythmias. It allows your caregiver to track your heart activity for several days, if needed.  Stress tests by exercise or by giving medicine that makes the heart beat faster. TREATMENT  Treatment of palpitations depends on the cause of your symptoms and can vary greatly. Most cases of palpitations do not require any treatment other than time, relaxation, and monitoring your symptoms. Other causes, such as atrial fibrillation, atrial flutter, or supraventricular tachycardia, usually require further treatment. HOME CARE INSTRUCTIONS   Avoid:  Caffeinated coffee, tea, soft drinks, diet pills, and energy drinks.  Chocolate.  Alcohol.  Stop smoking if you smoke.  Reduce your stress and anxiety. Things that can help you relax include:  A method that measures bodily functions so you can learn to control them (biofeedback).  Yoga.  Meditation.  Physical activity such as swimming, jogging, or walking.  Get plenty of rest and sleep. SEEK MEDICAL CARE IF:  You continue to have a fast or irregular heartbeat beyond 24 hours.  Your palpitations occur more often. SEEK IMMEDIATE MEDICAL CARE IF:  You develop chest pain or shortness of breath.  You have a severe headache.  You feel dizzy, or you faint. MAKE SURE YOU:  Understand these  instructions.  Will watch your condition.  Will get help right away if you are not doing well or get worse. Document Released: 11/07/2000 Document Revised: 03/07/2013 Document Reviewed: 01/09/2012 [facility] Patient Information 2014 [place].  Chest Pain (Nonspecific) It is often hard to give a specific diagnosis for the cause of chest pain. There is always a chance that your pain could be related to something serious, such as a heart attack or a blood clot in the lungs. You need to follow up with your caregiver for further evaluation. CAUSES   Heartburn.  Pneumonia or bronchitis.  Anxiety or stress.  Inflammation around your heart (pericarditis) or lung (pleuritis or pleurisy).  A blood clot in the lung.  A collapsed lung (pneumothorax). It can develop suddenly on its own (spontaneous pneumothorax) or from injury (trauma) to the chest.  Shingles infection (herpes zoster virus). The chest wall is composed of bones, muscles, and cartilage. Any of these can be the source of the pain.  The bones can be bruised by injury.  The muscles or cartilage can be strained by coughing or overwork.  The cartilage can be affected by inflammation and become sore (costochondritis). DIAGNOSIS  Lab tests or other studies, such as X-rays, electrocardiography, stress testing, or cardiac imaging, may be needed to find the cause of your pain.  TREATMENT   Treatment depends on what may be causing your chest pain. Treatment may include:  Acid blockers for heartburn.  Anti-inflammatory medicine.  Pain medicine for inflammatory conditions.  Antibiotics if an infection is present.  You may be advised to change lifestyle habits. This includes stopping smoking and avoiding alcohol, caffeine, and chocolate.  You may be advised to keep your head raised (elevated) when sleeping. This reduces the chance of acid going backward from your stomach into your esophagus.  Most of the time, nonspecific  chest pain will improve within 2 to 3 days with rest and mild pain medicine. HOME CARE INSTRUCTIONS   If antibiotics were prescribed, take your antibiotics as directed. Finish them even if you start to feel better.  For the next few days, avoid physical activities that bring on chest pain. Continue physical activities as directed.  Do not smoke.  Avoid drinking alcohol.  Only take over-the-counter or prescription medicine for pain, discomfort, or fever as directed by your caregiver.  Follow your caregiver's suggestions for further testing if your chest pain does not go away.  Keep any follow-up appointments you made. If you do not go to an appointment, you could develop lasting (chronic) problems with pain. If there is any problem keeping an appointment, you must call to reschedule. SEEK MEDICAL CARE IF:   You think you are having problems from the medicine you are taking. Read your medicine instructions carefully.  Your chest pain does not go away, even after treatment.  You develop a rash with blisters on your chest. SEEK IMMEDIATE MEDICAL CARE IF:   You have increased chest pain or pain that spreads to your arm, neck, jaw, back, or abdomen.  You develop shortness of breath, an increasing cough, or you are coughing up blood.  You have severe back or abdominal pain, feel nauseous, or vomit.  You develop severe weakness, fainting, or chills.  You have a fever. THIS IS AN EMERGENCY. Do not wait to see if the pain will go away. Get medical help at once. Call your local emergency services (911 in [place] Do not drive yourself to the hospital. MAKE SURE YOU:   Understand these instructions.  Will watch your condition.  Will get help right away if you are not doing well or get worse. Document Released: 08/20/2005 Document Revised: 02/02/2012 Document Reviewed: 06/15/2008 [facility] Patient Information 2014 [place].

## 2014-04-18 NOTE — ED Provider Notes (Signed)
Medical screening examination/treatment/procedure(s) were performed by non-physician practitioner and as supervising physician I was immediately available for consultation/collaboration.   EKG Interpretation   Date/Time:  Monday Apr 17 2014 10:23:26 EDT Ventricular Rate:  84 PR Interval:  117 QRS Duration: 74 QT Interval:  379 QTC Calculation: 448 R Axis:   53 Text Interpretation:  Sinus rhythm Borderline short PR interval Baseline  wander in lead(s) I II aVR V2 No significant change since last tracing  Confirmed by Cecilie Heidel  MD-J, Ineze Serrao ([id number]) on 04/17/2014 10:30:24 AM         Dorie Rank, MD 04/18/14 [phone]

## 2014-04-19 ENCOUNTER — Encounter: Payer: Self-pay | Admitting: Neurology

## 2014-04-21 ENCOUNTER — Encounter (HOSPITAL_COMMUNITY): Payer: Self-pay | Admitting: Emergency Medicine

## 2014-04-21 ENCOUNTER — Emergency Department (HOSPITAL_COMMUNITY)
Admission: EM | Admit: 2014-04-21 | Discharge: 2014-04-21 | Disposition: A | Discharge status: Home / Self Care | Payer: Medicaid Other | Attending: Emergency Medicine | Admitting: Emergency Medicine

## 2014-04-21 ENCOUNTER — Emergency Department (HOSPITAL_COMMUNITY): Payer: Medicaid Other

## 2014-04-21 DIAGNOSIS — Y9289 Other specified places as the place of occurrence of the external cause: Secondary | ICD-10-CM | POA: Insufficient documentation

## 2014-04-21 DIAGNOSIS — S298XXA Other specified injuries of thorax, initial encounter: Secondary | ICD-10-CM | POA: Insufficient documentation

## 2014-04-21 DIAGNOSIS — Z8742 Personal history of other diseases of the female genital tract: Secondary | ICD-10-CM | POA: Insufficient documentation

## 2014-04-21 DIAGNOSIS — F17200 Nicotine dependence, unspecified, uncomplicated: Secondary | ICD-10-CM | POA: Insufficient documentation

## 2014-04-21 DIAGNOSIS — IMO0002 Reserved for concepts with insufficient information to code with codable children: Secondary | ICD-10-CM | POA: Insufficient documentation

## 2014-04-21 DIAGNOSIS — G43909 Migraine, unspecified, not intractable, without status migrainosus: Secondary | ICD-10-CM | POA: Insufficient documentation

## 2014-04-21 DIAGNOSIS — W1809XA Striking against other object with subsequent fall, initial encounter: Secondary | ICD-10-CM | POA: Insufficient documentation

## 2014-04-21 DIAGNOSIS — T07XXXA Unspecified multiple injuries, initial encounter: Secondary | ICD-10-CM

## 2014-04-21 DIAGNOSIS — M129 Arthropathy, unspecified: Secondary | ICD-10-CM | POA: Insufficient documentation

## 2014-04-21 DIAGNOSIS — S7010XA Contusion of unspecified thigh, initial encounter: Secondary | ICD-10-CM | POA: Insufficient documentation

## 2014-04-21 DIAGNOSIS — J449 Chronic obstructive pulmonary disease, unspecified: Secondary | ICD-10-CM | POA: Insufficient documentation

## 2014-04-21 DIAGNOSIS — S8000XA Contusion of unspecified knee, initial encounter: Secondary | ICD-10-CM | POA: Insufficient documentation

## 2014-04-21 DIAGNOSIS — F319 Bipolar disorder, unspecified: Secondary | ICD-10-CM | POA: Insufficient documentation

## 2014-04-21 DIAGNOSIS — R296 Repeated falls: Secondary | ICD-10-CM | POA: Insufficient documentation

## 2014-04-21 DIAGNOSIS — J4489 Other specified chronic obstructive pulmonary disease: Secondary | ICD-10-CM | POA: Insufficient documentation

## 2014-04-21 DIAGNOSIS — M4120 Other idiopathic scoliosis, site unspecified: Secondary | ICD-10-CM | POA: Insufficient documentation

## 2014-04-21 DIAGNOSIS — I10 Essential (primary) hypertension: Secondary | ICD-10-CM | POA: Insufficient documentation

## 2014-04-21 DIAGNOSIS — Z79899 Other long term (current) drug therapy: Secondary | ICD-10-CM | POA: Insufficient documentation

## 2014-04-21 DIAGNOSIS — Y9389 Activity, other specified: Secondary | ICD-10-CM | POA: Insufficient documentation

## 2014-04-21 MED ORDER — TRAMADOL HCL 50 MG PO TABS
50.0000 mg | ORAL_TABLET | Freq: Once | ORAL | Status: AC
  Administered 2014-04-21: 50 mg via ORAL
  Filled 2014-04-21: qty 1

## 2014-04-21 NOTE — ED Provider Notes (Signed)
CSN: [id number]     Arrival date & time 04/21/14  1016 History   First MD Initiated Contact with Patient 04/21/14 1032     Chief Complaint  Patient presents with  . Fall  . Leg Pain   HPI Comments: Patient is a 48 y.o. Female who presents with knee pain and rib pain x 3 days.  Patient states that she fell while pushing her grocery cart.  Patient states that she hit her ribs on the cart and landed on her knees on the concrete.  Patient states that her pain is very sore in nature.  She states that her ribs are bothering her more than her knees.  She states that she has been having difficulty taking deep breaths and the pain is waking her from sleep.  She has tried taking methylcarbamol and ibuprofen for her pain but is having little relief.  Right knee is also painful at this time.  She states that she has to limp.  Of note the patient came in for unrelated CP on Wednesday before she fell.  She has not been evaluated for this problem.  Patient is a smoker and admits to a half pack a day.  Patient has a PMH of chronic back pain and fibromyalgia.  She was attending a pain clinic in [facility] but was dismissed for not showing up to her appointments.  Patient states that she is trying to get into preferred pain clinic.      The history is provided by the patient. No language interpreter was used.    Past Medical History  Diagnosis Date  . Asthma   . COPD (chronic obstructive pulmonary disease)   . Back pain   . Scoliosis   . Hypertension   . Arthritis   . Bipolar 1 disorder   . Depression   . Fibroid   . Migraine   . Chronic low back pain 03/06/2014   Past Surgical History  Procedure Laterality Date  . Cholecystectomy    . Tubal ligation Bilateral    Family History  Problem Relation Age of Onset  . Stroke Mother   . Hypertension Brother   . Hypertension Brother   . Hypertension Brother    History  Substance Use Topics  . Smoking status: Current Every Day Smoker -- 1.00 packs/day   Types: Cigarettes  . Smokeless tobacco: Never Used  . Alcohol Use: No   OB History   Grav Para Term Preterm Abortions TAB SAB Ect Mult Living                 Review of Systems  Respiratory: Positive for chest tightness and shortness of breath.   Musculoskeletal: Positive for arthralgias, back pain and joint swelling.  Hematological: Bruises/bleeds easily.      Allergies  Paxil and Wellbutrin  Home Medications   Prior to Admission medications   Medication Sig Start Date End Date Taking? Authorizing Provider  ALPRAZolam Duanne Moron) 1 MG tablet Take 1 mg by mouth 3 (three) times daily as needed for anxiety.   Yes Historical Provider, MD  budesonide-formoterol (SYMBICORT) 160-4.5 MCG/ACT inhaler Inhale 2 puffs into the lungs 2 (two) times daily.     Yes Historical Provider, MD  escitalopram (LEXAPRO) 20 MG tablet Take 40 mg by mouth every morning.    Yes Historical Provider, MD  gabapentin (NEURONTIN) 400 MG capsule Take 800-1,600 mg by mouth 3 (three) times daily. 800mg  in the morning, 800mg  in the evening, and 1600mg  at bedtime  Yes Historical Provider, MD  ibuprofen (ADVIL,MOTRIN) 800 MG tablet Take 800 mg by mouth every 8 (eight) hours as needed for pain.   Yes Historical Provider, MD  loratadine (CLARITIN) 10 MG tablet Take 10 mg by mouth every morning.    Yes Historical Provider, MD  methocarbamol (ROBAXIN) 750 MG tablet Take 750-1,500 mg by mouth 3 (three) times daily as needed for muscle spasms.    Yes Historical Provider, MD  omeprazole (PRILOSEC) 20 MG capsule Take 20 mg by mouth 2 (two) times daily before a meal.    Yes Historical Provider, MD  QUEtiapine (SEROQUEL) 100 MG tablet Take 100 mg by mouth at bedtime.   Yes Historical Provider, MD  solifenacin (VESICARE) 10 MG tablet Take 10 mg by mouth every evening.    Yes Historical Provider, MD  traZODone (DESYREL) 100 MG tablet Take 100-300 mg by mouth at bedtime.    Yes Historical Provider, MD  triamcinolone (NASACORT ALLERGY  24HR) 55 MCG/ACT AERO nasal inhaler Place 2 sprays into the nose 2 (two) times daily.   Yes Historical Provider, MD  ziprasidone (GEODON) 40 MG capsule Take 80 mg by mouth at bedtime.   Yes Historical Provider, MD  zonisamide (ZONEGRAN) 100 MG capsule Take 200 mg by mouth 2 (two) times daily.   Yes Historical Provider, MD   BP 140/73  Pulse 76  Temp(Src) 97.4 F (36.3 C) (Oral)  Resp 16  SpO2 97%  LMP 05/30/2011 Physical Exam  Nursing note and vitals reviewed. Constitutional: She is oriented to person, place, and time. She appears well-developed and well-nourished. No distress.  HENT:  Head: Normocephalic and atraumatic.  Eyes: Conjunctivae are normal. No scleral icterus.  Neck: Normal range of motion. Neck supple.  Cardiovascular: Normal rate, regular rhythm, normal heart sounds and intact distal pulses.  Exam reveals no gallop and no friction rub.   No murmur heard. Pulmonary/Chest: Effort normal and breath sounds normal. No accessory muscle usage. No respiratory distress. She has no decreased breath sounds. She has no wheezes. She has no rhonchi. She has no rales.  Chest tender to palpation over the right and left lateral wall.  No evidence of erythema, ecchymosis, emphysema.    Abdominal: Soft. Bowel sounds are normal. She exhibits no distension and no mass. There is no tenderness. There is no rebound and no guarding.  Musculoskeletal:  Patient rises from sitting to standing and ambulates with a mildly antalgic gain to the right.  On observation of the right leg There is scattered ecchymosis over the midthigh and medial knee. There is an abrasion.  Right hip has tenderness to palpation in the groin.  No femoral tenderness.  Full ROM.  Hip is irritable with internal and external rotation.  Right knee has negative lachmans, McMurrays, valgus and varus stress.  DP and PT pulses are 2+ bilaterally.  Sensation to light touch is intact throughout the right leg.  Neurological: She is alert and  oriented to person, place, and time.  Skin: Skin is warm and dry. She is not diaphoretic.  Psychiatric: She has a normal mood and affect. Her behavior is normal. Judgment and thought content normal.    ED Course  Procedures (including critical care time) Labs Review Labs Reviewed - No data to display  Imaging Review Dg Hip Complete Right  04/21/2014   CLINICAL DATA:  [place] [address] with right hip pain  EXAM: RIGHT HIP - COMPLETE 2+ VIEW  COMPARISON:  None.  FINDINGS: No pelvic or hip fracture. No degenerative  change or other focal finding.  IMPRESSION: Normal radiographs   Electronically Signed   By: Nelson Chimes M.D.   On: 04/21/2014 11:29     EKG Interpretation None      MDM   Final diagnoses:  Contusion, multiple sites   On physical exam patient has tenderness to palpation of the right hip and the chest wall.  Patient is a chronic pain patient and has recently been dismissed from a pain clinic for improper medication usage.  I have obtained an xray of the right hip due to tenderness to palpation and irritability with internal and external rotation which was negative for fractures.  I have spoken with patient about not prescribing narcotic or controlled substances for her at this time and her need to establish with a new pain clinic and follow-up with her PCP.  She endorses her understanding and agreement with this plan.  Patient is stable to be discharged home.  I have instructed the patient to take her prescription strength ibuprofen as prescribed, to rest, and use ice or heat whichever feels better.          Kenard Gower, PA-C 04/21/14 1150

## 2014-04-21 NOTE — ED Notes (Addendum)
PA at bedside.

## 2014-04-21 NOTE — ED Notes (Signed)
Pt c/o back pain and R leg pain after a fall x 2 days ago.  Pain score 10/10.  Pt reports she was pushing a grocery cart, when she hit a rut, flew into the cart, and landed on her side.  Bruising noted to R leg.

## 2014-04-21 NOTE — ED Notes (Signed)
Pt is taking a bus home

## 2014-04-21 NOTE — Discharge Instructions (Signed)
Contusion A contusion is the result of an injury to the skin and underlying tissues and is usually caused by direct trauma. The injury results in the appearance of a bruise on the skin overlying the injured tissues. Contusions cause rupture and bleeding of the small capillaries and blood vessels and affect function, because the bleeding infiltrates muscles, tendons, nerves, or other soft tissues.  SYMPTOMS   Swelling and often a hard lump in the injured area, either superficial or deep.  Pain and tenderness over the area of the contusion.  Feeling of firmness when pressure is exerted over the contusion.  Discoloration under the skin, beginning with redness and progressing to the characteristic "black and blue" bruise. CAUSES  A contusion is typically the result of direct trauma. This is often by a blunt object.  RISK INCREASES WITH:  Sports that have a high likelihood of trauma (football, boxing, ice hockey, soccer, field hockey, martial arts, basketball, and baseball).  Sports that make falling from a height likely (high-jumping, pole-vaulting, skating, or gymnastics).  Any bleeding disorder (hemophilia) or taking medications that affect clotting (aspirin, nonsteroidal anti-inflammatory medications, or warfarin [Coumadin]).  Inadequate protection of exposed areas during contact sports. PREVENTION  Maintain physical fitness:  Joint and muscle flexibility.  Strength and endurance.  Coordination.  Wear proper protective equipment. Make sure it fits correctly. PROGNOSIS  Contusions typically heal without any complications. Healing time varies with the severity of injury and intake of medications that affect clotting. Contusions usually heal in 1 to 4 weeks. RELATED COMPLICATIONS   Damage to nearby nerves or blood vessels, causing numbness, coldness, or paleness.  Compartment syndrome.  Bleeding into the soft tissues that leads to disability.  Infiltrative-type bleeding,  leading to the calcification and impaired function of the injured muscle (rare).  Prolonged healing time if usual activities are resumed too soon.  Infection if the skin over the injury site is broken.  Fracture of the bone underlying the contusion.  Stiffness in the joint where the injured muscle crosses. TREATMENT  Treatment initially consists of resting the injured area as well as medication and ice to reduce inflammation. The use of a compression bandage may also be helpful in minimizing inflammation. As pain diminishes and movement is tolerated, the joint where the affected muscle crosses should be moved to prevent stiffness and the shortening (contracture) of the joint. Movement of the joint should begin as soon as possible. It is also important to work on maintaining strength within the affected muscles. Occasionally, extra padding over the area of contusion may be recommended before returning to sports, particularly if re-injury is likely.  MEDICATION   If pain relief is necessary these medications are often recommended:  Nonsteroidal anti-inflammatory medications, such as aspirin and ibuprofen.  Other minor pain relievers, such as acetaminophen, are often recommended.  Prescription pain relievers may be given by your caregiver. Use only as directed and only as much as you need. HEAT AND COLD  Cold treatment (icing) relieves pain and reduces inflammation. Cold treatment should be applied for 10 to 15 minutes every 2 to 3 hours for inflammation and pain and immediately after any activity that aggravates your symptoms. Use ice packs or an ice massage. (To do an ice massage fill a large styrofoam cup with water and freeze. Tear a small amount of foam from the top so ice protrudes. Massage ice firmly over the injured area in a circle about the size of a softball.)  Heat treatment may be used prior to  performing the stretching and strengthening activities prescribed by your caregiver,  physical therapist, or athletic trainer. Use a heat pack or a warm soak. SEEK MEDICAL CARE IF:   Symptoms get worse or do not improve despite treatment in a few days.  You have difficulty moving a joint.  Any extremity becomes extremely painful, numb, pale, or cool (This is an emergency!).  Medication produces any side effects (bleeding, upset stomach, or allergic reaction).  Signs of infection (drainage from skin, headache, muscle aches, dizziness, fever, or general ill feeling) occur if skin was broken. Document Released: 11/10/2005 Document Revised: 02/02/2012 Document Reviewed: 02/22/2009 [facility] Patient Information 2014 [place], [place].

## 2014-04-22 NOTE — ED Provider Notes (Signed)
Medical screening examination/treatment/procedure(s) were performed by non-physician practitioner and as supervising physician I was immediately available for consultation/collaboration.   Mirel Hundal T Arora Coakley, MD 04/22/14 1624 

## 2014-05-04 ENCOUNTER — Encounter (INDEPENDENT_AMBULATORY_CARE_PROVIDER_SITE_OTHER): Payer: Self-pay | Admitting: Radiology

## 2014-05-04 ENCOUNTER — Ambulatory Visit (INDEPENDENT_AMBULATORY_CARE_PROVIDER_SITE_OTHER): Payer: Medicaid Other | Admitting: Neurology

## 2014-05-04 DIAGNOSIS — Z0289 Encounter for other administrative examinations: Secondary | ICD-10-CM

## 2014-05-04 DIAGNOSIS — M545 Low back pain, unspecified: Secondary | ICD-10-CM

## 2014-05-04 DIAGNOSIS — G8929 Other chronic pain: Secondary | ICD-10-CM

## 2014-05-04 NOTE — Progress Notes (Signed)
[name] is a 48 year old patient with a history of chronic low back pain and bilateral leg pain, left greater than right. She returns to the office today for EMG and nerve conduction study to evaluate the chronic pain syndrome.  Nerve conduction studies done on both lower extremities were unremarkable, without evidence of a peripheral neuropathy. EMG evaluation of both lower TMs were essentially normal.  The patient has a chronic pain syndrome, possibly associated with a fibromyalgia syndrome. There is no indication for chronic opiate use. The patient will continue her nonsteroidal anti-inflammatory medications, and gabapentin. The patient will benefit from regular stretching, low grade exercise.  The patient will followup through this office in about 4 months. The patient indicates that she cannot take medication such as Cymbalta.

## 2014-05-04 NOTE — Procedures (Signed)
     HISTORY:  [name] is a 48 year old patient with a history of chronic back pain and bilateral leg pain, left greater than right. The patient has a history of bipolar disorder as well. She returns for EMG and nerve conduction study evaluation.  NERVE CONDUCTION STUDIES:  Nerve conduction studies were performed on both lower extremities. The distal motor latencies and motor amplitudes for the peroneal and posterior tibial nerves were within normal limits. The nerve conduction velocities for these nerves were also normal. The H reflex latencies normal. The sensory latencies for the peroneal nerves were within normal limits.   EMG STUDIES:  EMG study was performed on the left lower extremity:  The tibialis anterior muscle reveals 2 to 4K motor units with full recruitment. No fibrillations or positive waves were seen. The peroneus tertius muscle reveals 2 to 4K motor units with full recruitment. No fibrillations or positive waves were seen. The medial gastrocnemius muscle reveals 1 to 3K motor units with full recruitment. No fibrillations or positive waves were seen. The vastus lateralis muscle reveals 2 to 4K motor units with full recruitment. No fibrillations or positive waves were seen. The iliopsoas muscle reveals 2 to 4K motor units with full recruitment. No fibrillations or positive waves were seen. The biceps femoris muscle (long head) reveals 2 to 4K motor units with full recruitment. No fibrillations or positive waves were seen. The lumbosacral paraspinal muscles were tested at 3 levels, and revealed no abnormalities of insertional activity at all 3 levels tested, with the exception that there was evidence of complex or benefit discharges at the lower level. There was good relaxation.  EMG study was performed on the right lower extremity:  The tibialis anterior muscle reveals 2 to 4K motor units with full recruitment. No fibrillations or positive waves were seen. The peroneus  tertius muscle reveals 2 to 4K motor units with full recruitment. No fibrillations or positive waves were seen. The medial gastrocnemius muscle reveals 1 to 3K motor units with full recruitment. No fibrillations or positive waves were seen. The vastus lateralis muscle reveals 2 to 4K motor units with full recruitment. No fibrillations or positive waves were seen. The iliopsoas muscle reveals 2 to 4K motor units with full recruitment. No fibrillations or positive waves were seen. The biceps femoris muscle (long head) reveals 2 to 4K motor units with full recruitment. No fibrillations or positive waves were seen. The lumbosacral paraspinal muscles were tested at 3 levels, and revealed no abnormalities of insertional activity at all 3 levels tested. There was good relaxation.   IMPRESSION:  Nerve conduction studies done on both lower extremities were unremarkable, without evidence of a peripheral neuropathy. EMG evaluation of both lower extremities were essentially normal, without evidence of an overlying lumbosacral radiculopathy on either side.  Jill Alexanders MD 05/04/2014 12:34 PM  [facility] [address] [place], [place] [address]  Phone [phone] Fax [phone]

## 2014-06-17 ENCOUNTER — Emergency Department (HOSPITAL_COMMUNITY): Payer: Medicaid Other

## 2014-06-17 ENCOUNTER — Encounter (HOSPITAL_COMMUNITY): Payer: Self-pay | Admitting: Emergency Medicine

## 2014-06-17 DIAGNOSIS — F319 Bipolar disorder, unspecified: Secondary | ICD-10-CM | POA: Insufficient documentation

## 2014-06-17 DIAGNOSIS — I10 Essential (primary) hypertension: Secondary | ICD-10-CM | POA: Diagnosis not present

## 2014-06-17 DIAGNOSIS — Z79899 Other long term (current) drug therapy: Secondary | ICD-10-CM | POA: Diagnosis not present

## 2014-06-17 DIAGNOSIS — J4489 Other specified chronic obstructive pulmonary disease: Secondary | ICD-10-CM | POA: Insufficient documentation

## 2014-06-17 DIAGNOSIS — F17200 Nicotine dependence, unspecified, uncomplicated: Secondary | ICD-10-CM | POA: Insufficient documentation

## 2014-06-17 DIAGNOSIS — R079 Chest pain, unspecified: Secondary | ICD-10-CM | POA: Diagnosis present

## 2014-06-17 DIAGNOSIS — Z8739 Personal history of other diseases of the musculoskeletal system and connective tissue: Secondary | ICD-10-CM | POA: Diagnosis not present

## 2014-06-17 DIAGNOSIS — J449 Chronic obstructive pulmonary disease, unspecified: Secondary | ICD-10-CM | POA: Insufficient documentation

## 2014-06-17 DIAGNOSIS — R5383 Other fatigue: Secondary | ICD-10-CM

## 2014-06-17 DIAGNOSIS — R5381 Other malaise: Secondary | ICD-10-CM | POA: Diagnosis not present

## 2014-06-17 DIAGNOSIS — M129 Arthropathy, unspecified: Secondary | ICD-10-CM | POA: Diagnosis not present

## 2014-06-17 DIAGNOSIS — G8929 Other chronic pain: Secondary | ICD-10-CM | POA: Diagnosis not present

## 2014-06-17 DIAGNOSIS — D259 Leiomyoma of uterus, unspecified: Secondary | ICD-10-CM | POA: Insufficient documentation

## 2014-06-17 LAB — BASIC METABOLIC PANEL
Anion gap: 13 (ref 5–15)
BUN: 12 mg/dL (ref 6–23)
CALCIUM: 9.7 mg/dL (ref 8.4–10.5)
CO2: 27 mEq/L (ref 19–32)
Chloride: 100 mEq/L (ref 96–112)
Creatinine, Ser: 0.98 mg/dL (ref 0.50–1.10)
GFR calc Af Amer: 77 mL/min — ABNORMAL LOW (ref >90)
GFR, EST NON AFRICAN AMERICAN: 67 mL/min — AB (ref >90)
GLUCOSE: 85 mg/dL (ref 70–99)
Potassium: 4.2 mEq/L (ref 3.7–5.3)
SODIUM: 140 meq/L (ref 137–147)

## 2014-06-17 LAB — CBC
HCT: 34.6 % — ABNORMAL LOW (ref 36.0–46.0)
HEMOGLOBIN: 11.2 g/dL — AB (ref 12.0–15.0)
MCH: 30.3 pg (ref 26.0–34.0)
MCHC: 32.4 g/dL (ref 30.0–36.0)
MCV: 93.5 fL (ref 78.0–100.0)
Platelets: 345 10*3/uL (ref 150–400)
RBC: 3.7 MIL/uL — ABNORMAL LOW (ref 3.87–5.11)
RDW: 14.9 % (ref 11.5–15.5)
WBC: 5.7 10*3/uL (ref 4.0–10.5)

## 2014-06-17 LAB — I-STAT TROPONIN, ED: Troponin i, poc: 0.01 ng/mL (ref 0.00–0.08)

## 2014-06-17 NOTE — ED Notes (Signed)
Patient refused wheelchair. 

## 2014-06-17 NOTE — ED Notes (Signed)
Pt in via EMS to triage- per EMS, pt in c/o generalized body aches, weakness and chest pain for the last two days, chest pain worse with palpation to left side, symptoms have been intermittent. Pt also c/o back pain which is chronic. Intermittent nausea also. Pt took 324mg  ASA prior to arrival. Pt refused EKG en route. Pt refused IV from EMS. Pt reports increased stress recently.

## 2014-06-17 NOTE — ED Notes (Signed)
Presents with left sided chest pain for 2 days, taking a deep breath makes pain worse, pain radaites to left neck, states, "I have been under a lot of stress lately. I am nauseous too" c/o general fatigue and bilateral leg tingling.

## 2014-06-18 ENCOUNTER — Emergency Department (HOSPITAL_COMMUNITY)
Admission: EM | Admit: 2014-06-18 | Discharge: 2014-06-18 | Disposition: A | Discharge status: Home / Self Care | Payer: Medicaid Other | Attending: Emergency Medicine | Admitting: Emergency Medicine

## 2014-06-18 DIAGNOSIS — R079 Chest pain, unspecified: Secondary | ICD-10-CM

## 2014-06-18 LAB — I-STAT TROPONIN, ED: Troponin i, poc: 0.01 ng/mL (ref 0.00–0.08)

## 2014-06-18 MED ORDER — HYDROCODONE-ACETAMINOPHEN 5-325 MG PO TABS
2.0000 | ORAL_TABLET | Freq: Once | ORAL | Status: AC
  Administered 2014-06-18: 2 via ORAL

## 2014-06-18 NOTE — Discharge Instructions (Signed)
If you were given medicines take as directed.  If you are on coumadin or contraceptives realize their levels and effectiveness is altered by many different medicines.  If you have any reaction (rash, tongues swelling, other) to the medicines stop taking and see a physician.   Please follow up as directed and return to the ER or see a physician for new or worsening symptoms.  Thank you. Filed Vitals:   06/17/14 2049 06/17/14 2238  BP: 131/80 138/64  Pulse: 74 82  Temp: 97.7 F (36.5 C)   TempSrc: Oral   Resp: 18 16  SpO2: 98% 97%    Chest Pain (Nonspecific) It is often hard to give a specific diagnosis for the cause of chest pain. There is always a chance that your pain could be related to something serious, such as a heart attack or a blood clot in the lungs. You need to follow up with your health care provider for further evaluation. CAUSES   Heartburn.  Pneumonia or bronchitis.  Anxiety or stress.  Inflammation around your heart (pericarditis) or lung (pleuritis or pleurisy).  A blood clot in the lung.  A collapsed lung (pneumothorax). It can develop suddenly on its own (spontaneous pneumothorax) or from trauma to the chest.  Shingles infection (herpes zoster virus). The chest wall is composed of bones, muscles, and cartilage. Any of these can be the source of the pain.  The bones can be bruised by injury.  The muscles or cartilage can be strained by coughing or overwork.  The cartilage can be affected by inflammation and become sore (costochondritis). DIAGNOSIS  Lab tests or other studies may be needed to find the cause of your pain. Your health care provider may have you take a test called an ambulatory electrocardiogram (ECG). An ECG records your heartbeat patterns over a 24-hour period. You may also have other tests, such as:  Transthoracic echocardiogram (TTE). During echocardiography, sound waves are used to evaluate how blood flows through your  heart.  Transesophageal echocardiogram (TEE).  Cardiac monitoring. This allows your health care provider to monitor your heart rate and rhythm in real time.  Holter monitor. This is a portable device that records your heartbeat and can help diagnose heart arrhythmias. It allows your health care provider to track your heart activity for several days, if needed.  Stress tests by exercise or by giving medicine that makes the heart beat faster. TREATMENT   Treatment depends on what may be causing your chest pain. Treatment may include:  Acid blockers for heartburn.  Anti-inflammatory medicine.  Pain medicine for inflammatory conditions.  Antibiotics if an infection is present.  You may be advised to change lifestyle habits. This includes stopping smoking and avoiding alcohol, caffeine, and chocolate.  You may be advised to keep your head raised (elevated) when sleeping. This reduces the chance of acid going backward from your stomach into your esophagus. Most of the time, nonspecific chest pain will improve within 2-3 days with rest and mild pain medicine.  HOME CARE INSTRUCTIONS   If antibiotics were prescribed, take them as directed. Finish them even if you start to feel better.  For the next few days, avoid physical activities that bring on chest pain. Continue physical activities as directed.  Do not use any tobacco products, including cigarettes, chewing tobacco, or electronic cigarettes.  Avoid drinking alcohol.  Only take medicine as directed by your health care provider.  Follow your health care provider's suggestions for further testing if your chest pain  does not go away.  Keep any follow-up appointments you made. If you do not go to an appointment, you could develop lasting (chronic) problems with pain. If there is any problem keeping an appointment, call to reschedule. SEEK MEDICAL CARE IF:   Your chest pain does not go away, even after treatment.  You have a rash  with blisters on your chest.  You have a fever. SEEK IMMEDIATE MEDICAL CARE IF:   You have increased chest pain or pain that spreads to your arm, neck, jaw, back, or abdomen.  You have shortness of breath.  You have an increasing cough, or you cough up blood.  You have severe back or abdominal pain.  You feel nauseous or vomit.  You have severe weakness.  You faint.  You have chills. This is an emergency. Do not wait to see if the pain will go away. Get medical help at once. Call your local emergency services (911 in [place] Do not drive yourself to the hospital. MAKE SURE YOU:   Understand these instructions.  Will watch your condition.  Will get help right away if you are not doing well or get worse. Document Released: 08/20/2005 Document Revised: 11/15/2013 Document Reviewed: 06/15/2008 [facility] Patient Information 2015 [place], [place]. This information is not intended to replace advice given to you by your health care provider. Make sure you discuss any questions you have with your health care provider.

## 2014-06-18 NOTE — ED Notes (Signed)
Reports left sided chest pain x 2 days, worsens with palpation.

## 2014-06-18 NOTE — ED Provider Notes (Signed)
CSN: [id number]     Arrival date & time 06/17/14  2048 History   First MD Initiated Contact with Patient 06/18/14 0050     Chief Complaint  Patient presents with  . Chest Pain  . Fatigue     (Consider location/radiation/quality/duration/timing/severity/associated sxs/prior Treatment) HPI Comments: 48 year old female with asthma, COPD, high blood pressure, bipolar, fibromyalgia, chronic pain presents with left-sided chest pain worse with palpation and taken breath. Patient denies recent surgery, blood clot history, estrogen, hemoptysis, active cancer, long travel or history of blood clots. Pain is gradually improved with time. Patient has had constant symptoms for 2 days straight. No recent workup for her heart. Patient's had similar symptoms in the past with her fibromyalgia and chronic pain including neck pain however this pain was more severe than normal. No direct radiation however she does have neck pain as well.  Patient is a 48 y.o. female presenting with chest pain. The history is provided by the patient.  Chest Pain Associated symptoms: fatigue   Associated symptoms: no abdominal pain, no back pain, no fever, no headache, no shortness of breath and not vomiting     Past Medical History  Diagnosis Date  . Asthma   . COPD (chronic obstructive pulmonary disease)   . Back pain   . Scoliosis   . Hypertension   . Arthritis   . Bipolar 1 disorder   . Depression   . Fibroid   . Migraine   . Chronic low back pain 03/06/2014   Past Surgical History  Procedure Laterality Date  . Cholecystectomy    . Tubal ligation Bilateral    Family History  Problem Relation Age of Onset  . Stroke Mother   . Hypertension Brother   . Hypertension Brother   . Hypertension Brother    History  Substance Use Topics  . Smoking status: Current Every Day Smoker -- 1.00 packs/day    Types: Cigarettes  . Smokeless tobacco: Never Used  . Alcohol Use: No   OB History   Grav Para Term Preterm  Abortions TAB SAB Ect Mult Living                 Review of Systems  Constitutional: Positive for fatigue. Negative for fever and chills.  HENT: Negative for congestion.   Eyes: Negative for visual disturbance.  Respiratory: Negative for shortness of breath.   Cardiovascular: Positive for chest pain. Negative for leg swelling.  Gastrointestinal: Negative for vomiting and abdominal pain.  Genitourinary: Negative for dysuria and flank pain.  Musculoskeletal: Negative for back pain, neck pain and neck stiffness.  Skin: Negative for rash.  Neurological: Negative for light-headedness and headaches.      Allergies  Cymbalta; Paxil; and Wellbutrin  Home Medications   Prior to Admission medications   Medication Sig Start Date End Date Taking? Authorizing Provider  ALPRAZolam Duanne Moron) 1 MG tablet Take 1 mg by mouth 3 (three) times daily as needed for anxiety.   Yes Historical Provider, MD  budesonide-formoterol (SYMBICORT) 160-4.5 MCG/ACT inhaler Inhale 2 puffs into the lungs 2 (two) times daily.     Yes Historical Provider, MD  escitalopram (LEXAPRO) 20 MG tablet Take 40 mg by mouth every morning.    Yes Historical Provider, MD  gabapentin (NEURONTIN) 400 MG capsule Take 800-1,600 mg by mouth 3 (three) times daily. 800mg  in the morning, 800mg  in the evening, and 1600mg  at bedtime   Yes Historical Provider, MD  ibuprofen (ADVIL,MOTRIN) 800 MG tablet Take 800 mg by mouth  4 (four) times daily as needed.    Yes Historical Provider, MD  loratadine (CLARITIN) 10 MG tablet Take 10 mg by mouth every morning.    Yes Historical Provider, MD  methocarbamol (ROBAXIN) 750 MG tablet Take 1,500 mg by mouth 4 (four) times daily as needed for muscle spasms.    Yes Historical Provider, MD  omeprazole (PRILOSEC) 20 MG capsule Take 40 mg by mouth 2 (two) times daily before a meal.    Yes Historical Provider, MD  oxyCODONE-acetaminophen (PERCOCET/ROXICET) 5-325 MG per tablet Take 2 tablets by mouth every 4  (four) hours as needed for moderate pain or severe pain.   Yes Historical Provider, MD  QUEtiapine (SEROQUEL) 100 MG tablet Take 100 mg by mouth at bedtime.   Yes Historical Provider, MD  solifenacin (VESICARE) 10 MG tablet Take 10 mg by mouth every morning.    Yes Historical Provider, MD  traZODone (DESYREL) 100 MG tablet Take 200 mg by mouth at bedtime.    Yes Historical Provider, MD  ziprasidone (GEODON) 40 MG capsule Take 80 mg by mouth at bedtime.   Yes Historical Provider, MD   BP 138/64  Pulse 82  Temp(Src) 97.7 F (36.5 C) (Oral)  Resp 16  SpO2 97%  LMP 05/30/2011 Physical Exam  Nursing note and vitals reviewed. Constitutional: She is oriented to person, place, and time. She appears well-developed and well-nourished.  HENT:  Head: Normocephalic and atraumatic.  Eyes: Conjunctivae are normal. Right eye exhibits no discharge. Left eye exhibits no discharge.  Neck: Normal range of motion. Neck supple. No tracheal deviation present.  Cardiovascular: Normal rate and regular rhythm.   Pulmonary/Chest: Effort normal and breath sounds normal.  Abdominal: Soft. She exhibits no distension. There is no tenderness. There is no guarding.  Musculoskeletal: She exhibits no edema and no tenderness.  Focal musculoskeletal is left lateral chest pain no sign of rash  Neurological: She is alert and oriented to person, place, and time.  Skin: Skin is warm. No rash noted.  Psychiatric: She has a normal mood and affect.    ED Course  Procedures (including critical care time) Labs Review Labs Reviewed  CBC - Abnormal; Notable for the following:    RBC 3.70 (*)    Hemoglobin 11.2 (*)    HCT 34.6 (*)    All other components within normal limits  BASIC METABOLIC PANEL - Abnormal; Notable for the following:    GFR calc non Af Amer 67 (*)    GFR calc Af Amer 77 (*)    All other components within normal limits  I-STAT TROPOININ, ED  Randolm Idol, ED    Imaging Review Dg Chest 2  View  06/17/2014   CLINICAL DATA:  48 year old female with left-sided chest pain and shortness of breath  EXAM: CHEST  2 VIEW  COMPARISON:  04/17/2014  FINDINGS: The cardiomediastinal silhouette is unremarkable.  There is no evidence of focal airspace disease, pulmonary edema, suspicious pulmonary nodule/mass, pleural effusion, or pneumothorax. No acute bony abnormalities are identified.  IMPRESSION: No active cardiopulmonary disease.   Electronically Signed   By: Hassan Rowan M.D.   On: 06/17/2014 21:37     EKG Interpretation   Date/Time:  Saturday June 17 2014 20:50:29 EDT Ventricular Rate:  74 PR Interval:  130 QRS Duration: 74 QT Interval:  372 QTC Calculation: 412 R Axis:   50 Text Interpretation:  Normal sinus rhythm Possible Left atrial enlargement  Borderline ECG no significant changes Confirmed by Zaylah Blecha  MD,  Lanitra Battaglini  ([id number]) on 06/18/2014 12:48:21 AM Also confirmed by Reather Converse  MD, Jetson Pickrel  ([id number])  on 06/18/2014 1:20:57 AM      MDM   Final diagnoses:  Chest pain, unspecified chest pain type   Patient very low-risk pulmonary embolism workup, PE RC negative, no d-dimer indicated at this time. Vitals unremarkable. Patient low risk cardiac and heart score 2 discussed outpatient followup with primary care provider and local cardiologist and reasons to return. Patient asking for Norco for her chronic pain, I will not refill prescription that I gave two in ER. Repeat troponin negative. EKG unremarkable.  Results and differential diagnosis were discussed with the patient/parent/guardian. Close follow up outpatient was discussed, comfortable with the plan.   Medications  HYDROcodone-acetaminophen (NORCO/VICODIN) 5-325 MG per tablet 2 tablet (not administered)    Filed Vitals:   06/17/14 2049 06/17/14 2238  BP: 131/80 138/64  Pulse: 74 82  Temp: 97.7 F (36.5 C)   TempSrc: Oral   Resp: 18 16  SpO2: 98% 97%           Mariea Clonts, MD 06/18/14 [phone]

## 2014-08-20 ENCOUNTER — Emergency Department (HOSPITAL_BASED_OUTPATIENT_CLINIC_OR_DEPARTMENT_OTHER)
Admission: EM | Admit: 2014-08-20 | Discharge: 2014-08-20 | Disposition: A | Discharge status: Home / Self Care | Payer: Medicaid Other | Attending: Emergency Medicine | Admitting: Emergency Medicine

## 2014-08-20 ENCOUNTER — Encounter (HOSPITAL_BASED_OUTPATIENT_CLINIC_OR_DEPARTMENT_OTHER): Payer: Self-pay | Admitting: Emergency Medicine

## 2014-08-20 DIAGNOSIS — G8929 Other chronic pain: Secondary | ICD-10-CM | POA: Insufficient documentation

## 2014-08-20 DIAGNOSIS — J449 Chronic obstructive pulmonary disease, unspecified: Secondary | ICD-10-CM | POA: Insufficient documentation

## 2014-08-20 DIAGNOSIS — I10 Essential (primary) hypertension: Secondary | ICD-10-CM | POA: Diagnosis not present

## 2014-08-20 DIAGNOSIS — M129 Arthropathy, unspecified: Secondary | ICD-10-CM | POA: Insufficient documentation

## 2014-08-20 DIAGNOSIS — N390 Urinary tract infection, site not specified: Secondary | ICD-10-CM | POA: Diagnosis not present

## 2014-08-20 DIAGNOSIS — J4489 Other specified chronic obstructive pulmonary disease: Secondary | ICD-10-CM | POA: Insufficient documentation

## 2014-08-20 DIAGNOSIS — F17200 Nicotine dependence, unspecified, uncomplicated: Secondary | ICD-10-CM | POA: Diagnosis not present

## 2014-08-20 DIAGNOSIS — F319 Bipolar disorder, unspecified: Secondary | ICD-10-CM | POA: Diagnosis not present

## 2014-08-20 DIAGNOSIS — Z791 Long term (current) use of non-steroidal anti-inflammatories (NSAID): Secondary | ICD-10-CM | POA: Diagnosis not present

## 2014-08-20 DIAGNOSIS — R300 Dysuria: Secondary | ICD-10-CM | POA: Diagnosis present

## 2014-08-20 DIAGNOSIS — Z79899 Other long term (current) drug therapy: Secondary | ICD-10-CM | POA: Diagnosis not present

## 2014-08-20 DIAGNOSIS — G43909 Migraine, unspecified, not intractable, without status migrainosus: Secondary | ICD-10-CM | POA: Insufficient documentation

## 2014-08-20 DIAGNOSIS — IMO0002 Reserved for concepts with insufficient information to code with codable children: Secondary | ICD-10-CM | POA: Diagnosis not present

## 2014-08-20 LAB — URINALYSIS, ROUTINE W REFLEX MICROSCOPIC
GLUCOSE, UA: NEGATIVE mg/dL
Hgb urine dipstick: NEGATIVE
KETONES UR: 15 mg/dL — AB
Nitrite: NEGATIVE
Protein, ur: >300 mg/dL — AB
Specific Gravity, Urine: 1.035 — ABNORMAL HIGH (ref 1.005–1.030)
Urobilinogen, UA: 1 mg/dL (ref 0.0–1.0)
pH: 6 (ref 5.0–8.0)

## 2014-08-20 LAB — URINE MICROSCOPIC-ADD ON

## 2014-08-20 MED ORDER — NITROFURANTOIN MONOHYD MACRO 100 MG PO CAPS: 100.0000 mg | ORAL_CAPSULE | Freq: Two times a day (BID) | ORAL | Status: DC

## 2014-08-20 MED ORDER — PHENAZOPYRIDINE HCL 200 MG PO TABS: 200.0000 mg | ORAL_TABLET | Freq: Three times a day (TID) | ORAL | Status: DC

## 2014-08-20 MED ORDER — PHENAZOPYRIDINE HCL 100 MG PO TABS
100.0000 mg | ORAL_TABLET | Freq: Once | ORAL | Status: AC
  Administered 2014-08-20: 100 mg via ORAL
  Filled 2014-08-20: qty 1

## 2014-08-20 MED ORDER — NITROFURANTOIN MONOHYD MACRO 100 MG PO CAPS
100.0000 mg | ORAL_CAPSULE | Freq: Once | ORAL | Status: AC
  Administered 2014-08-20: 100 mg via ORAL
  Filled 2014-08-20: qty 1

## 2014-08-20 NOTE — Discharge Instructions (Signed)

## 2014-08-20 NOTE — ED Notes (Signed)
Patient here with dysuria and frequency x 4 days, also reports 4 weeks of diarrhea that her doctor diagnosed as IBS

## 2014-08-20 NOTE — ED Provider Notes (Signed)
CSN: [id number]     Arrival date & time 08/20/14  1054 History   First MD Initiated Contact with Patient 08/20/14 1205     Chief Complaint  Patient presents with  . Dysuria      HPI  Vision presents complaining of dysuria and frequency. Symptoms 4 days. Then out of the bathroom all morning and last evening. Frequent diarrhea. All of her primary care physician for IBS. No change in this. No flank pain. No fevers no chills. No nausea or vomiting.  Past Medical History  Diagnosis Date  . Asthma   . COPD (chronic obstructive pulmonary disease)   . Back pain   . Scoliosis   . Hypertension   . Arthritis   . Bipolar 1 disorder   . Depression   . Fibroid   . Migraine   . Chronic low back pain 03/06/2014   Past Surgical History  Procedure Laterality Date  . Cholecystectomy    . Tubal ligation Bilateral    Family History  Problem Relation Age of Onset  . Stroke Mother   . Hypertension Brother   . Hypertension Brother   . Hypertension Brother    History  Substance Use Topics  . Smoking status: Current Every Day Smoker -- 1.00 packs/day    Types: Cigarettes  . Smokeless tobacco: Never Used  . Alcohol Use: No   OB History   Grav Para Term Preterm Abortions TAB SAB Ect Mult Living                 Review of Systems  Constitutional: Negative for fever, chills, diaphoresis, appetite change and fatigue.  HENT: Negative for mouth sores, sore throat and trouble swallowing.   Eyes: Negative for visual disturbance.  Respiratory: Negative for cough, chest tightness, shortness of breath and wheezing.   Cardiovascular: Negative for chest pain.  Gastrointestinal: Negative for nausea, vomiting, abdominal pain, diarrhea and abdominal distention.  Endocrine: Negative for polydipsia, polyphagia and polyuria.  Genitourinary: Positive for urgency and frequency. Negative for dysuria and hematuria.  Musculoskeletal: Negative for gait problem.  Skin: Negative for color change, pallor and  rash.  Neurological: Negative for dizziness, syncope, light-headedness and headaches.  Hematological: Does not bruise/bleed easily.  Psychiatric/Behavioral: Negative for behavioral problems and confusion.      Allergies  Cymbalta; Paxil; and Wellbutrin  Home Medications   Prior to Admission medications   Medication Sig Start Date End Date Taking? Authorizing Provider  ALPRAZolam Duanne Moron) 1 MG tablet Take 1 mg by mouth 3 (three) times daily as needed for anxiety.    Historical Provider, MD  budesonide-formoterol (SYMBICORT) 160-4.5 MCG/ACT inhaler Inhale 2 puffs into the lungs 2 (two) times daily.      Historical Provider, MD  escitalopram (LEXAPRO) 20 MG tablet Take 40 mg by mouth every morning.     Historical Provider, MD  gabapentin (NEURONTIN) 400 MG capsule Take 800-1,600 mg by mouth 3 (three) times daily. 800mg  in the morning, 800mg  in the evening, and 1600mg  at bedtime    Historical Provider, MD  ibuprofen (ADVIL,MOTRIN) 800 MG tablet Take 800 mg by mouth 4 (four) times daily as needed.     Historical Provider, MD  loratadine (CLARITIN) 10 MG tablet Take 10 mg by mouth every morning.     Historical Provider, MD  methocarbamol (ROBAXIN) 750 MG tablet Take 1,500 mg by mouth 4 (four) times daily as needed for muscle spasms.     Historical Provider, MD  nitrofurantoin, macrocrystal-monohydrate, (MACROBID) 100 MG capsule Take  1 capsule (100 mg total) by mouth 2 (two) times daily. 08/20/14   Tanna Furry, MD  omeprazole (PRILOSEC) 20 MG capsule Take 40 mg by mouth 2 (two) times daily before a meal.     Historical Provider, MD  oxyCODONE-acetaminophen (PERCOCET/ROXICET) 5-325 MG per tablet Take 2 tablets by mouth every 4 (four) hours as needed for moderate pain or severe pain.    Historical Provider, MD  phenazopyridine (PYRIDIUM) 200 MG tablet Take 1 tablet (200 mg total) by mouth 3 (three) times daily. 08/20/14   Tanna Furry, MD  QUEtiapine (SEROQUEL) 100 MG tablet Take 100 mg by mouth at  bedtime.    Historical Provider, MD  solifenacin (VESICARE) 10 MG tablet Take 10 mg by mouth every morning.     Historical Provider, MD  traZODone (DESYREL) 100 MG tablet Take 200 mg by mouth at bedtime.     Historical Provider, MD  ziprasidone (GEODON) 40 MG capsule Take 80 mg by mouth at bedtime.    Historical Provider, MD   BP 135/89  Pulse 72  Temp(Src) 98.9 F (37.2 C)  Resp 16  Wt 160 lb (72.576 kg)  SpO2 100%  LMP 05/30/2011 Physical Exam  Constitutional: She is oriented to person, place, and time. She appears well-developed and well-nourished. No distress.  HENT:  Head: Normocephalic.  Eyes: Conjunctivae are normal. Pupils are equal, round, and reactive to light. No scleral icterus.  Neck: Normal range of motion. Neck supple. No thyromegaly present.  Cardiovascular: Normal rate and regular rhythm.  Exam reveals no gallop and no friction rub.   No murmur heard. Pulmonary/Chest: Effort normal and breath sounds normal. No respiratory distress. She has no wheezes. She has no rales.  Abdominal: Soft. Bowel sounds are normal. She exhibits no distension. There is no tenderness. There is no rebound.    Minimal suprapubic tenderness without guarding or rebound. No flank pain or tenderness.  Musculoskeletal: Normal range of motion.  Neurological: She is alert and oriented to person, place, and time.  Skin: Skin is warm and dry. No rash noted.  Psychiatric: She has a normal mood and affect. Her behavior is normal.    ED Course  Procedures (including critical care time) Labs Review Labs Reviewed  URINALYSIS, ROUTINE W REFLEX MICROSCOPIC - Abnormal; Notable for the following:    Color, Urine AMBER (*)    APPearance CLOUDY (*)    Specific Gravity, Urine 1.035 (*)    Bilirubin Urine MODERATE (*)    Ketones, ur 15 (*)    Protein, ur >300 (*)    Leukocytes, UA SMALL (*)    All other components within normal limits  URINE MICROSCOPIC-ADD ON - Abnormal; Notable for the following:      Bacteria, UA MANY (*)    Casts HYALINE CASTS (*)    All other components within normal limits    Imaging Review No results found.   EKG Interpretation None      MDM   Final diagnoses:  UTI (lower urinary tract infection)    Given dose of by mouth Macrobid and Pyridium here. Discharged with prescriptions for the same. Continue followup with her primary care physician regarding IBS.    Tanna Furry, MD 08/20/14 1359

## 2014-08-20 NOTE — ED Notes (Signed)
MD at bedside. 

## 2014-08-21 ENCOUNTER — Emergency Department (HOSPITAL_BASED_OUTPATIENT_CLINIC_OR_DEPARTMENT_OTHER): Payer: Medicaid Other

## 2014-08-21 ENCOUNTER — Emergency Department (HOSPITAL_BASED_OUTPATIENT_CLINIC_OR_DEPARTMENT_OTHER)
Admission: EM | Admit: 2014-08-21 | Discharge: 2014-08-21 | Disposition: A | Discharge status: Home / Self Care | Payer: Medicaid Other | Attending: Emergency Medicine | Admitting: Emergency Medicine

## 2014-08-21 ENCOUNTER — Encounter (HOSPITAL_BASED_OUTPATIENT_CLINIC_OR_DEPARTMENT_OTHER): Payer: Self-pay | Admitting: Emergency Medicine

## 2014-08-21 DIAGNOSIS — G43909 Migraine, unspecified, not intractable, without status migrainosus: Secondary | ICD-10-CM | POA: Insufficient documentation

## 2014-08-21 DIAGNOSIS — E876 Hypokalemia: Secondary | ICD-10-CM | POA: Diagnosis not present

## 2014-08-21 DIAGNOSIS — M129 Arthropathy, unspecified: Secondary | ICD-10-CM | POA: Diagnosis not present

## 2014-08-21 DIAGNOSIS — J4489 Other specified chronic obstructive pulmonary disease: Secondary | ICD-10-CM | POA: Insufficient documentation

## 2014-08-21 DIAGNOSIS — F319 Bipolar disorder, unspecified: Secondary | ICD-10-CM | POA: Insufficient documentation

## 2014-08-21 DIAGNOSIS — F411 Generalized anxiety disorder: Secondary | ICD-10-CM | POA: Insufficient documentation

## 2014-08-21 DIAGNOSIS — Z8742 Personal history of other diseases of the female genital tract: Secondary | ICD-10-CM | POA: Diagnosis not present

## 2014-08-21 DIAGNOSIS — F17200 Nicotine dependence, unspecified, uncomplicated: Secondary | ICD-10-CM | POA: Insufficient documentation

## 2014-08-21 DIAGNOSIS — M4120 Other idiopathic scoliosis, site unspecified: Secondary | ICD-10-CM | POA: Insufficient documentation

## 2014-08-21 DIAGNOSIS — R209 Unspecified disturbances of skin sensation: Secondary | ICD-10-CM | POA: Insufficient documentation

## 2014-08-21 DIAGNOSIS — G8929 Other chronic pain: Secondary | ICD-10-CM | POA: Insufficient documentation

## 2014-08-21 DIAGNOSIS — H9209 Otalgia, unspecified ear: Secondary | ICD-10-CM | POA: Insufficient documentation

## 2014-08-21 DIAGNOSIS — R5381 Other malaise: Secondary | ICD-10-CM | POA: Diagnosis not present

## 2014-08-21 DIAGNOSIS — R5383 Other fatigue: Secondary | ICD-10-CM

## 2014-08-21 DIAGNOSIS — J449 Chronic obstructive pulmonary disease, unspecified: Secondary | ICD-10-CM | POA: Insufficient documentation

## 2014-08-21 DIAGNOSIS — Z79899 Other long term (current) drug therapy: Secondary | ICD-10-CM | POA: Diagnosis not present

## 2014-08-21 DIAGNOSIS — N390 Urinary tract infection, site not specified: Secondary | ICD-10-CM | POA: Insufficient documentation

## 2014-08-21 DIAGNOSIS — I10 Essential (primary) hypertension: Secondary | ICD-10-CM | POA: Insufficient documentation

## 2014-08-21 DIAGNOSIS — R0789 Other chest pain: Secondary | ICD-10-CM | POA: Diagnosis not present

## 2014-08-21 DIAGNOSIS — IMO0002 Reserved for concepts with insufficient information to code with codable children: Secondary | ICD-10-CM | POA: Insufficient documentation

## 2014-08-21 DIAGNOSIS — R079 Chest pain, unspecified: Secondary | ICD-10-CM | POA: Diagnosis present

## 2014-08-21 LAB — COMPREHENSIVE METABOLIC PANEL
ALT: 7 U/L (ref 0–35)
AST: 12 U/L (ref 0–37)
Albumin: 3.9 g/dL (ref 3.5–5.2)
Alkaline Phosphatase: 60 U/L (ref 39–117)
Anion gap: 14 (ref 5–15)
BILIRUBIN TOTAL: 0.3 mg/dL (ref 0.3–1.2)
BUN: 10 mg/dL (ref 6–23)
CHLORIDE: 100 meq/L (ref 96–112)
CO2: 27 meq/L (ref 19–32)
Calcium: 9.6 mg/dL (ref 8.4–10.5)
Creatinine, Ser: 0.9 mg/dL (ref 0.50–1.10)
GFR calc Af Amer: 86 mL/min — ABNORMAL LOW (ref >90)
GFR calc non Af Amer: 74 mL/min — ABNORMAL LOW (ref >90)
Glucose, Bld: 108 mg/dL — ABNORMAL HIGH (ref 70–99)
POTASSIUM: 2.8 meq/L — AB (ref 3.7–5.3)
SODIUM: 141 meq/L (ref 137–147)
Total Protein: 6.8 g/dL (ref 6.0–8.3)

## 2014-08-21 LAB — CBC WITH DIFFERENTIAL/PLATELET
BASOS ABS: 0.1 10*3/uL (ref 0.0–0.1)
Basophils Relative: 1 % (ref 0–1)
Eosinophils Absolute: 0.2 10*3/uL (ref 0.0–0.7)
Eosinophils Relative: 2 % (ref 0–5)
HEMATOCRIT: 36.6 % (ref 36.0–46.0)
Hemoglobin: 12.6 g/dL (ref 12.0–15.0)
LYMPHS PCT: 18 % (ref 12–46)
Lymphs Abs: 1.7 10*3/uL (ref 0.7–4.0)
MCH: 31.7 pg (ref 26.0–34.0)
MCHC: 34.4 g/dL (ref 30.0–36.0)
MCV: 92 fL (ref 78.0–100.0)
Monocytes Absolute: 1.4 10*3/uL — ABNORMAL HIGH (ref 0.1–1.0)
Monocytes Relative: 14 % — ABNORMAL HIGH (ref 3–12)
NEUTROS ABS: 6.1 10*3/uL (ref 1.7–7.7)
NEUTROS PCT: 65 % (ref 43–77)
PLATELETS: 329 10*3/uL (ref 150–400)
RBC: 3.98 MIL/uL (ref 3.87–5.11)
RDW: 15.3 % (ref 11.5–15.5)
WBC: 9.4 10*3/uL (ref 4.0–10.5)

## 2014-08-21 LAB — TROPONIN I: Troponin I: <0.3 ng/mL (ref <0.30)

## 2014-08-21 MED ORDER — POTASSIUM CHLORIDE CRYS ER 20 MEQ PO TBCR: 20.0000 meq | EXTENDED_RELEASE_TABLET | Freq: Two times a day (BID) | ORAL | Status: DC

## 2014-08-21 MED ORDER — ANTIPYRINE-BENZOCAINE 5.4-1.4 % OT SOLN: 3.0000 [drp] | OTIC | Status: DC | PRN

## 2014-08-21 MED ORDER — POTASSIUM CHLORIDE CRYS ER 20 MEQ PO TBCR: 40.0000 meq | EXTENDED_RELEASE_TABLET | Freq: Once | ORAL | Status: DC

## 2014-08-21 MED ORDER — CEPHALEXIN 500 MG PO CAPS: 500.0000 mg | ORAL_CAPSULE | Freq: Four times a day (QID) | ORAL | Status: DC

## 2014-08-21 NOTE — ED Provider Notes (Signed)
CSN: [id number]     Arrival date & time 08/21/14  1402 History  This chart was scribed for Pamella Pert, MD by Ladene Artist, ED Scribe. The patient was seen in room MH11/MH11. Patient's care was started at 3:27 PM.   Chief Complaint  Patient presents with  . Chest Pain  . Weakness   Patient is a 48 y.o. female presenting with chest pain and weakness. The history is provided by the patient. No language interpreter was used.  Chest Pain Pain location:  Unable to specify Pain quality: sharp   Pain radiates to:  Does not radiate Pain radiates to the back: no   Pain severity:  Mild Duration:  4 hours Timing:  Constant Progression:  Resolved Chronicity:  Recurrent Associated symptoms: numbness   Associated symptoms: no abdominal pain, no back pain, no cough, no fatigue and no headache   Weakness Pertinent negatives include no chest pain (resolved), no abdominal pain and no headaches.  HPI Comments: [name] is a 48 y.o. female, with a h/o depression, bipolar, who presents to the Emergency Department complaining of constant chest pain onset 4 hours ago. Pt describes the chest pain as a sharp sensation and states that chest pain has resolved. Pt reports associated numbness/tingling in bilateral arms onset today. She reports similar symptoms in the past with anxiety. Pt was seen here last night, 08/20/14, by Tanna Furry, MD for dysuria onset 2 days PTA; she was prescribed Macrobid and sent home. She states that she has taken 2 tablets but "does not like the way the medicine is making her feel". Pt reports an increase in anxiety and ear pain since beginning the medication. Pt also reports diarrhea onset 2-3 weeks ago. She denies improvement with dysuria. Pt is prescribed Xanax but states that she can not find her medicine. Pt is a smoker. No family h/o CAD.  Past Medical History  Diagnosis Date  . Asthma   . COPD (chronic obstructive pulmonary disease)   . Back pain   . Scoliosis   .  Hypertension   . Arthritis   . Bipolar 1 disorder   . Depression   . Fibroid   . Migraine   . Chronic low back pain 03/06/2014   Past Surgical History  Procedure Laterality Date  . Cholecystectomy    . Tubal ligation Bilateral    Family History  Problem Relation Age of Onset  . Stroke Mother   . Hypertension Brother   . Hypertension Brother   . Hypertension Brother    History  Substance Use Topics  . Smoking status: Current Every Day Smoker -- 1.00 packs/day    Types: Cigarettes  . Smokeless tobacco: Never Used  . Alcohol Use: No   OB History   Grav Para Term Preterm Abortions TAB SAB Ect Mult Living                 Review of Systems  Constitutional: Negative for appetite change and fatigue.  HENT: Positive for ear pain. Negative for congestion, ear discharge and sinus pressure.   Eyes: Negative for discharge.  Respiratory: Negative for cough.   Cardiovascular: Negative for chest pain (resolved).  Gastrointestinal: Positive for diarrhea (prior). Negative for abdominal pain.  Genitourinary: Positive for dysuria. Negative for frequency and hematuria.  Musculoskeletal: Negative for back pain.  Skin: Negative for rash.  Neurological: Positive for numbness. Negative for seizures and headaches.  Psychiatric/Behavioral: Negative for hallucinations. The patient is nervous/anxious.   All other systems reviewed and  are negative.  Allergies  Cymbalta; Paxil; and Wellbutrin  Home Medications   Prior to Admission medications   Medication Sig Start Date End Date Taking? Authorizing Provider  ALPRAZolam Duanne Moron) 1 MG tablet Take 1 mg by mouth 3 (three) times daily as needed for anxiety.    Historical Provider, MD  budesonide-formoterol (SYMBICORT) 160-4.5 MCG/ACT inhaler Inhale 2 puffs into the lungs 2 (two) times daily.      Historical Provider, MD  escitalopram (LEXAPRO) 20 MG tablet Take 40 mg by mouth every morning.     Historical Provider, MD  gabapentin (NEURONTIN) 400 MG  capsule Take 800-1,600 mg by mouth 3 (three) times daily. 800mg  in the morning, 800mg  in the evening, and 1600mg  at bedtime    Historical Provider, MD  ibuprofen (ADVIL,MOTRIN) 800 MG tablet Take 800 mg by mouth 4 (four) times daily as needed.     Historical Provider, MD  loratadine (CLARITIN) 10 MG tablet Take 10 mg by mouth every morning.     Historical Provider, MD  methocarbamol (ROBAXIN) 750 MG tablet Take 1,500 mg by mouth 4 (four) times daily as needed for muscle spasms.     Historical Provider, MD  nitrofurantoin, macrocrystal-monohydrate, (MACROBID) 100 MG capsule Take 1 capsule (100 mg total) by mouth 2 (two) times daily. 08/20/14   Tanna Furry, MD  omeprazole (PRILOSEC) 20 MG capsule Take 40 mg by mouth 2 (two) times daily before a meal.     Historical Provider, MD  oxyCODONE-acetaminophen (PERCOCET/ROXICET) 5-325 MG per tablet Take 2 tablets by mouth every 4 (four) hours as needed for moderate pain or severe pain.    Historical Provider, MD  phenazopyridine (PYRIDIUM) 200 MG tablet Take 1 tablet (200 mg total) by mouth 3 (three) times daily. 08/20/14   Tanna Furry, MD  QUEtiapine (SEROQUEL) 100 MG tablet Take 100 mg by mouth at bedtime.    Historical Provider, MD  solifenacin (VESICARE) 10 MG tablet Take 10 mg by mouth every morning.     Historical Provider, MD  traZODone (DESYREL) 100 MG tablet Take 200 mg by mouth at bedtime.     Historical Provider, MD  ziprasidone (GEODON) 40 MG capsule Take 80 mg by mouth at bedtime.    Historical Provider, MD   Triage Vitals: BP 129/81  Pulse 79  Temp(Src) 98.2 F (36.8 C) (Oral)  Resp 18  Ht 5\' 3"  (1.6 m)  Wt 160 lb (72.576 kg)  BMI 28.35 kg/m2  SpO2 98%  LMP 05/30/2011 Physical Exam  Constitutional: She is oriented to person, place, and time. She appears well-developed and well-nourished. No distress.  HENT:  Head: Normocephalic and atraumatic.  Right Ear: Hearing normal.  Left Ear: Hearing normal.  Nose: Nose normal.  Mouth/Throat:  Oropharynx is clear and moist and mucous membranes are normal.  Normal appearing TM's bilaterally.   Eyes: Conjunctivae and EOM are normal. Pupils are equal, round, and reactive to light.  Neck: Normal range of motion. Neck supple.  Cardiovascular: Normal rate, regular rhythm, S1 normal, S2 normal and normal heart sounds.  Exam reveals no gallop and no friction rub.   No murmur heard. Pulmonary/Chest: Effort normal and breath sounds normal. No respiratory distress. She exhibits no tenderness.  Abdominal: Soft. Normal appearance and bowel sounds are normal. There is no hepatosplenomegaly. There is no tenderness. There is no rebound, no guarding, no tenderness at McBurney's point and negative Murphy's sign. No hernia.  Musculoskeletal: Normal range of motion.  Neurological: She is alert and oriented to person,  place, and time. She has normal strength. No sensory deficit. Coordination and gait normal. GCS eye subscore is 4. GCS verbal subscore is 5. GCS motor subscore is 6.  Normal strength and sensation in all extremities   Skin: Skin is warm, dry and intact. No rash noted. No cyanosis.  Psychiatric: She has a normal mood and affect. Her speech is normal and behavior is normal. Thought content normal.   ED Course  Procedures (including critical care time) DIAGNOSTIC STUDIES: Oxygen Saturation is 98% on RA, normal by my interpretation.    COORDINATION OF CARE: 3:38 PM-Discussed treatment plan which includes EKG with pt at bedside and pt agreed to plan.   Labs Review Labs Reviewed  CBC WITH DIFFERENTIAL - Abnormal; Notable for the following:    Monocytes Relative 14 (*)    Monocytes Absolute 1.4 (*)    All other components within normal limits  COMPREHENSIVE METABOLIC PANEL - Abnormal; Notable for the following:    Potassium 2.8 (*)    Glucose, Bld 108 (*)    GFR calc non Af Amer 74 (*)    GFR calc Af Amer 86 (*)    All other components within normal limits  TROPONIN I   Imaging  Review Dg Chest 2 View  08/21/2014   CLINICAL DATA:  Chest pain and left arm numbness.  Ear infection.  EXAM: CHEST  2 VIEW  COMPARISON:  06/17/2014  FINDINGS: Lungs are adequately inflated without consolidation or effusion. The cardiomediastinal silhouette and remainder of the exam is unchanged.  IMPRESSION: No active cardiopulmonary disease.   Electronically Signed   By: Marin Olp M.D.   On: 08/21/2014 16:02     EKG Interpretation   Date/Time:  Monday August 21 2014 14:45:03 EDT Ventricular Rate:  73 PR Interval:  118 QRS Duration: 80 QT Interval:  536 QTC Calculation: 590 R Axis:   69 Text Interpretation:   Critical Test Result: Long QTc Normal sinus  rhythm Possible Left atrial enlargement Prolonged QT no other significant  change Confirmed by Nataliyah Packham  MD, Aymen Widrig ([id number]) on 08/21/2014 3:22:12 PM      MDM   Final diagnoses:  Atypical chest pain  UTI (lower urinary tract infection)  Hypokalemia    3:45 PM 48 y.o. female w hx of asthma, COPD, HTN, bipolar, smoker who pw ongoing frequency and dysuria. She states she had some cp around 11am which she has had previously w/ anxiety. Also had UE tingling.  Describes a sharp pain in her chest. Currently asx. Wells/perc neg. She believes it may be related to the macrobid she started. Also c/o left ear ache, but has been off allergy meds for several days. AFVSS here. CP likely related to anxiety but will get screening imaging/labs. No FH of heart dis per pt. Will changed Rx for UTI from macrobid to keflex at pt's request.   4:30 PM: Pt requesting to leave to smoke and take a xanax. She continues to appear well. Low risk for MACE per HEART score. Labs/imaging unremarkable w/ exception of hypokalemia. As pt wanting to leave now, will tx w/ po. First dose here and Rx for home.  I have discussed the diagnosis/risks/treatment options with the patient and believe the pt to be eligible for discharge home to follow-up with her pcp. We also  discussed returning to the ED immediately if new or worsening sx occur. We discussed the sx which are most concerning (e.g., return of cp, sob, fever, continued urinary sx) that necessitate immediate return.  Medications administered to the patient during their visit and any new prescriptions provided to the patient are listed below.  Medications given during this visit Medications  potassium chloride SA (K-DUR,KLOR-CON) CR tablet 40 mEq (not administered)    Discharge Medication List as of 08/21/2014  4:33 PM    START taking these medications   Details  antipyrine-benzocaine (AURALGAN) otic solution Place 3-4 drops into the left ear every 2 (two) hours as needed for ear pain., Starting 08/21/2014, Until Discontinued, Print    cephALEXin (KEFLEX) 500 MG capsule Take 1 capsule (500 mg total) by mouth 4 (four) times daily., Starting 08/21/2014, Until Discontinued, Print    potassium chloride SA (K-DUR,KLOR-CON) 20 MEQ tablet Take 1 tablet (20 mEq total) by mouth 2 (two) times daily., Starting 08/21/2014, Until Discontinued, Print        I personally performed the services described in this documentation, which was scribed in my presence. The recorded information has been reviewed and is accurate.     Pamella Pert, MD 08/21/14 2246

## 2014-08-21 NOTE — ED Notes (Signed)
Called to pt room.  Pt asking to go outside to smoke a cigarette and take a Xanax.  EDP informed of pt status.

## 2014-08-21 NOTE — ED Notes (Addendum)
Chest pain and numbness all over her body. States she feels weak. Pain in her ears. States she has an ear infection. Was seen here last night and started on Macrobid for UTI.

## 2014-08-21 NOTE — ED Notes (Signed)
Pt tells me she developed low back pain and frequency yesterday.  Awakened this am not feeling well.  Had a "panic attack" this am lasting 4 hours and relieved after she took her husband's medicine. Was seen at Urgent Care, told "they couldn't get an EKG because my pulse was too low".  Sent to ED for further evaluation.  Pt denies palpitations, chest pain, or SOB. She also reports presently taking Macrobid for a UTI and states it makes her feel funny- states she was having a BM, bearing down and whole body went numb.

## 2014-08-21 NOTE — ED Notes (Signed)
Pt declines medication and d/c VS.

## 2015-01-09 DIAGNOSIS — E782 Mixed hyperlipidemia: Secondary | ICD-10-CM | POA: Insufficient documentation

## 2015-01-09 DIAGNOSIS — J439 Emphysema, unspecified: Secondary | ICD-10-CM | POA: Insufficient documentation

## 2015-01-09 DIAGNOSIS — R0789 Other chest pain: Secondary | ICD-10-CM | POA: Insufficient documentation

## 2015-01-09 DIAGNOSIS — F17210 Nicotine dependence, cigarettes, uncomplicated: Secondary | ICD-10-CM | POA: Insufficient documentation

## 2015-01-09 DIAGNOSIS — M797 Fibromyalgia: Secondary | ICD-10-CM | POA: Insufficient documentation

## 2015-03-07 ENCOUNTER — Encounter (HOSPITAL_BASED_OUTPATIENT_CLINIC_OR_DEPARTMENT_OTHER): Payer: Self-pay | Admitting: *Deleted

## 2015-03-07 ENCOUNTER — Emergency Department (HOSPITAL_BASED_OUTPATIENT_CLINIC_OR_DEPARTMENT_OTHER): Payer: Medicaid Other

## 2015-03-07 ENCOUNTER — Emergency Department (HOSPITAL_BASED_OUTPATIENT_CLINIC_OR_DEPARTMENT_OTHER)
Admission: EM | Admit: 2015-03-07 | Discharge: 2015-03-08 | Disposition: A | Discharge status: Home / Self Care | Payer: Medicaid Other | Attending: Emergency Medicine | Admitting: Emergency Medicine

## 2015-03-07 DIAGNOSIS — Z792 Long term (current) use of antibiotics: Secondary | ICD-10-CM | POA: Diagnosis not present

## 2015-03-07 DIAGNOSIS — M419 Scoliosis, unspecified: Secondary | ICD-10-CM | POA: Insufficient documentation

## 2015-03-07 DIAGNOSIS — R0789 Other chest pain: Secondary | ICD-10-CM

## 2015-03-07 DIAGNOSIS — I10 Essential (primary) hypertension: Secondary | ICD-10-CM | POA: Diagnosis not present

## 2015-03-07 DIAGNOSIS — G8929 Other chronic pain: Secondary | ICD-10-CM | POA: Insufficient documentation

## 2015-03-07 DIAGNOSIS — R071 Chest pain on breathing: Secondary | ICD-10-CM

## 2015-03-07 DIAGNOSIS — F319 Bipolar disorder, unspecified: Secondary | ICD-10-CM | POA: Insufficient documentation

## 2015-03-07 DIAGNOSIS — M1990 Unspecified osteoarthritis, unspecified site: Secondary | ICD-10-CM | POA: Diagnosis not present

## 2015-03-07 DIAGNOSIS — R079 Chest pain, unspecified: Secondary | ICD-10-CM

## 2015-03-07 DIAGNOSIS — E871 Hypo-osmolality and hyponatremia: Secondary | ICD-10-CM | POA: Insufficient documentation

## 2015-03-07 DIAGNOSIS — Z720 Tobacco use: Secondary | ICD-10-CM | POA: Insufficient documentation

## 2015-03-07 DIAGNOSIS — Z7951 Long term (current) use of inhaled steroids: Secondary | ICD-10-CM | POA: Diagnosis not present

## 2015-03-07 DIAGNOSIS — E876 Hypokalemia: Secondary | ICD-10-CM | POA: Diagnosis not present

## 2015-03-07 DIAGNOSIS — Z79899 Other long term (current) drug therapy: Secondary | ICD-10-CM | POA: Diagnosis not present

## 2015-03-07 DIAGNOSIS — J449 Chronic obstructive pulmonary disease, unspecified: Secondary | ICD-10-CM | POA: Insufficient documentation

## 2015-03-07 DIAGNOSIS — Z8742 Personal history of other diseases of the female genital tract: Secondary | ICD-10-CM | POA: Insufficient documentation

## 2015-03-07 DIAGNOSIS — G43909 Migraine, unspecified, not intractable, without status migrainosus: Secondary | ICD-10-CM | POA: Diagnosis not present

## 2015-03-07 LAB — BASIC METABOLIC PANEL
Anion gap: 8 (ref 5–15)
BUN: <5 mg/dL — ABNORMAL LOW (ref 6–23)
CO2: 32 mmol/L (ref 19–32)
Calcium: 8.6 mg/dL (ref 8.4–10.5)
Chloride: 94 mmol/L — ABNORMAL LOW (ref 96–112)
Creatinine, Ser: 0.7 mg/dL (ref 0.50–1.10)
GFR calc Af Amer: >90 mL/min (ref >90)
GLUCOSE: 107 mg/dL — AB (ref 70–99)
POTASSIUM: 2.9 mmol/L — AB (ref 3.5–5.1)
Sodium: 134 mmol/L — ABNORMAL LOW (ref 135–145)

## 2015-03-07 LAB — CBC WITH DIFFERENTIAL/PLATELET
BASOS PCT: 2 % — AB (ref 0–1)
Basophils Absolute: 0.1 10*3/uL (ref 0.0–0.1)
EOS PCT: 3 % (ref 0–5)
Eosinophils Absolute: 0.1 10*3/uL (ref 0.0–0.7)
HCT: 36.2 % (ref 36.0–46.0)
HEMOGLOBIN: 12 g/dL (ref 12.0–15.0)
LYMPHS PCT: 46 % (ref 12–46)
Lymphs Abs: 2 10*3/uL (ref 0.7–4.0)
MCH: 29.6 pg (ref 26.0–34.0)
MCHC: 33.1 g/dL (ref 30.0–36.0)
MCV: 89.2 fL (ref 78.0–100.0)
Monocytes Absolute: 0.9 10*3/uL (ref 0.1–1.0)
Monocytes Relative: 22 % — ABNORMAL HIGH (ref 3–12)
Neutro Abs: 1.2 10*3/uL — ABNORMAL LOW (ref 1.7–7.7)
Neutrophils Relative %: 27 % — ABNORMAL LOW (ref 43–77)
Platelets: 212 10*3/uL (ref 150–400)
RBC: 4.06 MIL/uL (ref 3.87–5.11)
RDW: 14.8 % (ref 11.5–15.5)
WBC: 4.3 10*3/uL (ref 4.0–10.5)

## 2015-03-07 LAB — D-DIMER, QUANTITATIVE: D-Dimer, Quant: 2.67 ug/mL-FEU — ABNORMAL HIGH (ref 0.00–0.48)

## 2015-03-07 LAB — TROPONIN I

## 2015-03-07 MED ORDER — SODIUM CHLORIDE 0.9 % IV BOLUS (SEPSIS)
1000.0000 mL | Freq: Once | INTRAVENOUS | Status: AC
Start: 2015-03-07 — End: 2015-03-07
  Administered 2015-03-07: 1000 mL via INTRAVENOUS

## 2015-03-07 MED ORDER — KETOROLAC TROMETHAMINE 30 MG/ML IJ SOLN
30.0000 mg | Freq: Once | INTRAMUSCULAR | Status: AC
  Administered 2015-03-07: 30 mg via INTRAVENOUS
  Filled 2015-03-07: qty 1

## 2015-03-07 MED ORDER — POTASSIUM CHLORIDE 10 MEQ/100ML IV SOLN
10.0000 meq | Freq: Once | INTRAVENOUS | Status: AC
  Administered 2015-03-07: 10 meq via INTRAVENOUS
  Filled 2015-03-07: qty 100

## 2015-03-07 MED ORDER — POTASSIUM CHLORIDE CRYS ER 20 MEQ PO TBCR
40.0000 meq | EXTENDED_RELEASE_TABLET | Freq: Once | ORAL | Status: AC
  Administered 2015-03-07: 40 meq via ORAL
  Filled 2015-03-07: qty 2

## 2015-03-07 MED ORDER — IOHEXOL 350 MG/ML SOLN
100.0000 mL | Freq: Once | INTRAVENOUS | Status: AC | PRN
  Administered 2015-03-07: 100 mL via INTRAVENOUS

## 2015-03-07 MED ORDER — IPRATROPIUM-ALBUTEROL 0.5-2.5 (3) MG/3ML IN SOLN
3.0000 mL | RESPIRATORY_TRACT | Status: DC
  Administered 2015-03-07: 3 mL via RESPIRATORY_TRACT
  Filled 2015-03-07: qty 3

## 2015-03-07 MED ORDER — ALPRAZOLAM 0.5 MG PO TABS
1.0000 mg | ORAL_TABLET | Freq: Once | ORAL | Status: AC
  Administered 2015-03-07: 1 mg via ORAL
  Filled 2015-03-07: qty 2

## 2015-03-07 NOTE — ED Notes (Signed)
Right chest is extremely tender to touch, pain increases with arm movement and positioning.

## 2015-03-07 NOTE — ED Notes (Signed)
Pt c/o Cp/ SOB left sided x 1 week

## 2015-03-07 NOTE — ED Provider Notes (Signed)
CSN: [id number]     Arrival date & time 03/07/15  1759 History   First MD Initiated Contact with Patient 03/07/15 1912     Chief Complaint  Patient presents with  . Chest Pain     (Consider location/radiation/quality/duration/timing/severity/associated sxs/prior Treatment) HPI  [name] is a 49 y.o. female with PMH of COPD, hypertension, asthma, arthritis, bipolar, depression, looking presenting with one week of chest pain that migrates in her chest. It is intermittent. Chest pain worse with palpation and described as sharp. It is also worse with arm movement. Pain occurs at rest. Patient also endorses shortness of breath but no nausea or vomiting or diaphoresis. Patient denies fevers, chills, cough. Patient denies dyslipidemia, family history of heart disease. Patient also endorses mild headache that started today and developed gradually. She denies any visual changes, slurred speech, weakness. She endorses lightheadedness worse with standing and movement.   Past Medical History  Diagnosis Date  . Asthma   . COPD (chronic obstructive pulmonary disease)   . Back pain   . Scoliosis   . Hypertension   . Arthritis   . Bipolar 1 disorder   . Depression   . Fibroid   . Migraine   . Chronic low back pain 03/06/2014   Past Surgical History  Procedure Laterality Date  . Cholecystectomy    . Tubal ligation Bilateral    Family History  Problem Relation Age of Onset  . Stroke Mother   . Hypertension Brother   . Hypertension Brother   . Hypertension Brother    History  Substance Use Topics  . Smoking status: Current Every Day Smoker -- 1.00 packs/day    Types: Cigarettes  . Smokeless tobacco: Never Used  . Alcohol Use: No   OB History    No data available     Review of Systems 10 Systems reviewed and are negative for acute change except as noted in the HPI.    Allergies  Cymbalta; Paxil; and Wellbutrin  Home Medications   Prior to Admission medications    Medication Sig Start Date End Date Taking? Authorizing Provider  ALPRAZolam Duanne Moron) 1 MG tablet Take 1 mg by mouth 3 (three) times daily as needed for anxiety.    Historical Provider, MD  antipyrine-benzocaine Toniann Fail) otic solution Place 3-4 drops into the left ear every 2 (two) hours as needed for ear pain. 08/21/14   Pamella Pert, MD  budesonide-formoterol (SYMBICORT) 160-4.5 MCG/ACT inhaler Inhale 2 puffs into the lungs 2 (two) times daily.      Historical Provider, MD  cephALEXin (KEFLEX) 500 MG capsule Take 1 capsule (500 mg total) by mouth 4 (four) times daily. 08/21/14   Pamella Pert, MD  escitalopram (LEXAPRO) 20 MG tablet Take 40 mg by mouth every morning.     Historical Provider, MD  gabapentin (NEURONTIN) 400 MG capsule Take 800-1,600 mg by mouth 3 (three) times daily. 800mg  in the morning, 800mg  in the evening, and 1600mg  at bedtime    Historical Provider, MD  ibuprofen (ADVIL,MOTRIN) 800 MG tablet Take 800 mg by mouth 4 (four) times daily as needed.     Historical Provider, MD  loratadine (CLARITIN) 10 MG tablet Take 10 mg by mouth every morning.     Historical Provider, MD  methocarbamol (ROBAXIN) 750 MG tablet Take 1,500 mg by mouth 4 (four) times daily as needed for muscle spasms.     Historical Provider, MD  nitrofurantoin, macrocrystal-monohydrate, (MACROBID) 100 MG capsule Take 1 capsule (100 mg total) by  mouth 2 (two) times daily. 08/20/14   Tanna Furry, MD  omeprazole (PRILOSEC) 20 MG capsule Take 40 mg by mouth 2 (two) times daily before a meal.     Historical Provider, MD  oxyCODONE-acetaminophen (PERCOCET/ROXICET) 5-325 MG per tablet Take 2 tablets by mouth every 4 (four) hours as needed for moderate pain or severe pain.    Historical Provider, MD  phenazopyridine (PYRIDIUM) 200 MG tablet Take 1 tablet (200 mg total) by mouth 3 (three) times daily. 08/20/14   Tanna Furry, MD  potassium chloride SA (K-DUR,KLOR-CON) 20 MEQ tablet Take 1 tablet (20 mEq total) by mouth daily.  03/08/15   Ernestina Patches, MD  QUEtiapine (SEROQUEL) 100 MG tablet Take 100 mg by mouth at bedtime.    Historical Provider, MD  solifenacin (VESICARE) 10 MG tablet Take 10 mg by mouth every morning.     Historical Provider, MD  traZODone (DESYREL) 100 MG tablet Take 200 mg by mouth at bedtime.     Historical Provider, MD  ziprasidone (GEODON) 40 MG capsule Take 80 mg by mouth at bedtime.    Historical Provider, MD   BP 122/79 mmHg  Pulse 73  Resp 14  SpO2 97% Physical Exam  Constitutional: She appears well-developed and well-nourished. No distress.  HENT:  Head: Normocephalic and atraumatic.  Mouth/Throat: Oropharynx is clear and moist.  Eyes: Conjunctivae and EOM are normal. Pupils are equal, round, and reactive to light. Right eye exhibits no discharge. Left eye exhibits no discharge.  Neck: Normal range of motion. Neck supple. No JVD present.  No nuchal rigidity  Cardiovascular: Normal rate and regular rhythm.   No leg swelling or tenderness. Negative Homan's sign.  Pulmonary/Chest: Effort normal and breath sounds normal. No respiratory distress. She has no wheezes.  Abdominal: Soft. Bowel sounds are normal. She exhibits no distension. There is no tenderness.  Neurological: She is alert. No cranial nerve deficit. Coordination normal.  Speech is clear and goal oriented. Peripheral visual fields intact. Strength 5/5 in upper and lower extremities. Sensation intact. Intact rapid alternating movements, finger to nose, and heel to shin. Negative Romberg. No pronator drift. Normal gait.   Skin: Skin is warm and dry. She is not diaphoretic.  Nursing note and vitals reviewed.   ED Course  Procedures (including critical care time) Labs Review Labs Reviewed  CBC WITH DIFFERENTIAL/PLATELET - Abnormal; Notable for the following:    Neutrophils Relative % 27 (*)    Monocytes Relative 22 (*)    Basophils Relative 2 (*)    Neutro Abs 1.2 (*)    All other components within normal limits   BASIC METABOLIC PANEL - Abnormal; Notable for the following:    Sodium 134 (*)    Potassium 2.9 (*)    Chloride 94 (*)    Glucose, Bld 107 (*)    BUN <5 (*)    All other components within normal limits  D-DIMER, QUANTITATIVE - Abnormal; Notable for the following:    D-Dimer, Quant 2.67 (*)    All other components within normal limits  TROPONIN I  TROPONIN I    Imaging Review Dg Chest 2 View  03/07/2015   CLINICAL DATA:  Chest pain for 1 week.  Difficulty breathing  EXAM: CHEST  2 VIEW  COMPARISON:  August 21, 2014  FINDINGS: Lungs are clear. Heart size and pulmonary vascularity are normal. No pneumothorax. No adenopathy. No bone lesions.  IMPRESSION: No edema or consolidation.   Electronically Signed   By: Lowella Grip  III M.D.   On: 03/07/2015 20:51   Ct Angio Chest Pe W/cm &/or Wo Cm  03/07/2015   CLINICAL DATA:  Chest pain and shortness of breath for 1 week. Question pulmonary embolus.  EXAM: CT ANGIOGRAPHY CHEST WITH CONTRAST  TECHNIQUE: Multidetector CT imaging of the chest was performed using the standard protocol during bolus administration of intravenous contrast. Multiplanar CT image reconstructions and MIPs were obtained to evaluate the vascular anatomy.  CONTRAST:  149mL OMNIPAQUE IOHEXOL 350 MG/ML SOLN  COMPARISON:  Radiographs earlier this day.  FINDINGS: There are no filling defects within the pulmonary arteries to suggest pulmonary embolus.  The thoracic aorta is normal in caliber without dissection or aneurysm, mild atherosclerosis. Conventional branching pattern from the aortic arch. The heart size is normal. There is no pleural or pericardial effusion. No mediastinal or hilar adenopathy.  Mild apical predominant emphysema. No consolidation, pulmonary nodule or mass. There is minimal linear atelectasis in the right lower lobe. Trachea and bronchi are widely patent.  No acute abnormality in the included upper abdomen. There are no acute or suspicious osseous  abnormalities.  Review of the MIP images confirms the above findings.  IMPRESSION: 1. No pulmonary embolus. 2. Mild apical predominant emphysema. No localized pulmonary process.   Electronically Signed   By: Jeb Levering M.D.   On: 03/07/2015 22:28     EKG Interpretation   Date/Time:  Wednesday March 07 2015 18:07:59 EDT Ventricular Rate:  85 PR Interval:  118 QRS Duration: 76 QT Interval:  410 QTC Calculation: 487 R Axis:   68 Text Interpretation:  Normal sinus rhythm Prolonged QT Abnormal ECG  Confirmed by DOCHERTY  MD, MEGAN ([id number]) on 03/07/2015 6:14:24 PM      MDM   Final diagnoses:  Chest pain  Atypical chest pain  Hypokalemia   Patient presenting with one week of intermittent chest pain that is worse with movement and palpation. VSS. Chest pain reproducible on palpation. Patient reports improvement with DuoNeb in ED. Patient with low risk heart score and low risk Wells d-dimer obtained. It was elevated to CT angioma ordered to rule out PE. CT in your normal and troponin and delta troponin negative. EKG without evidence of ischemia. I doubt ACS. Patient does have prolonged QT. She was found to have hypokalemia and given oral as well as IV supplementation. Patient has had this in the past and stressed the importance of following up with her primary care provider for further workup. She is given outpatient prescription and follow-up for recheck.  Discussed return precautions with patient. Discussed all results and patient verbalizes understanding and agrees with plan.  This is a shared patient. This patient was discussed with the physician who saw and evaluated the patient and agrees with the plan.  Filed Vitals:   03/07/15 1935 03/07/15 2007 03/07/15 2141 03/08/15 0027  BP: 110/87  105/55 122/79  Pulse: 76  78 73  Resp: 16  14 14   SpO2: 93% 97% 97% 97%     Al Corpus, PA-C 03/08/15 [place], MD 03/08/15 1733

## 2015-03-08 LAB — TROPONIN I

## 2015-03-08 MED ORDER — POTASSIUM CHLORIDE CRYS ER 20 MEQ PO TBCR: 20.0000 meq | EXTENDED_RELEASE_TABLET | Freq: Every day | ORAL | Status: DC

## 2015-03-08 NOTE — ED Provider Notes (Signed)
Medical screening examination/treatment/procedure(s) were conducted as a shared visit with non-physician practitioner(s) and myself.  I personally evaluated the patient during the encounter.   EKG Interpretation   Date/Time:  Wednesday March 07 2015 18:07:59 EDT Ventricular Rate:  85 PR Interval:  118 QRS Duration: 76 QT Interval:  410 QTC Calculation: 487 R Axis:   68 Text Interpretation:  Normal sinus rhythm Prolonged QT Abnormal ECG  Confirmed by Wave Calzada  MD, Laurieanne Galloway ([id number]) on 03/07/2015 6:14:24 PM      3 hour.troponin is negative.  CT angiogram is normal. .  Patient has been given by mouth and IV potassium do not feel pain is likely to be ACS.  I'll have her follow-up with her PCP for decision about outpatient stress testing as well as repeat potassium level drop.  She will be discharged home with 3 days of by mouth potassium supplementation  1. Atypical chest pain   2. Chest pain   3. Chest pain on breathing   4. Hypokalemia      Ernestina Patches, MD 03/08/15 [phone]

## 2015-03-08 NOTE — Discharge Instructions (Signed)
Chest Pain (Nonspecific) °It is often hard to give a specific diagnosis for the cause of chest pain. There is always a chance that your pain could be related to something serious, such as a heart attack or a blood clot in the lungs. You need to follow up with your health care provider for further evaluation. °CAUSES  °· Heartburn. °· Pneumonia or bronchitis. °· Anxiety or stress. °· Inflammation around your heart (pericarditis) or lung (pleuritis or pleurisy). °· A blood clot in the lung. °· A collapsed lung (pneumothorax). It can develop suddenly on its own (spontaneous pneumothorax) or from trauma to the chest. °· Shingles infection (herpes zoster virus). °The chest wall is composed of bones, muscles, and cartilage. Any of these can be the source of the pain. °· The bones can be bruised by injury. °· The muscles or cartilage can be strained by coughing or overwork. °· The cartilage can be affected by inflammation and become sore (costochondritis). °DIAGNOSIS  °Lab tests or other studies may be needed to find the cause of your pain. Your health care provider may have you take a test called an ambulatory electrocardiogram (ECG). An ECG records your heartbeat patterns over a 24-hour period. You may also have other tests, such as: °· Transthoracic echocardiogram (TTE). During echocardiography, sound waves are used to evaluate how blood flows through your heart. °· Transesophageal echocardiogram (TEE). °· Cardiac monitoring. This allows your health care provider to monitor your heart rate and rhythm in real time. °· Holter monitor. This is a portable device that records your heartbeat and can help diagnose heart arrhythmias. It allows your health care provider to track your heart activity for several days, if needed. °· Stress tests by exercise or by giving medicine that makes the heart beat faster. °TREATMENT  °· Treatment depends on what may be causing your chest pain. Treatment may include: °· Acid blockers for  heartburn. °· Anti-inflammatory medicine. °· Pain medicine for inflammatory conditions. °· Antibiotics if an infection is present. °· You may be advised to change lifestyle habits. This includes stopping smoking and avoiding alcohol, caffeine, and chocolate. °· You may be advised to keep your head raised (elevated) when sleeping. This reduces the chance of acid going backward from your stomach into your esophagus. °Most of the time, nonspecific chest pain will improve within 2-3 days with rest and mild pain medicine.  °HOME CARE INSTRUCTIONS  °· If antibiotics were prescribed, take them as directed. Finish them even if you start to feel better. °· For the next few days, avoid physical activities that bring on chest pain. Continue physical activities as directed. °· Do not use any tobacco products, including cigarettes, chewing tobacco, or electronic cigarettes. °· Avoid drinking alcohol. °· Only take medicine as directed by your health care provider. °· Follow your health care provider's suggestions for further testing if your chest pain does not go away. °· Keep any follow-up appointments you made. If you do not go to an appointment, you could develop lasting (chronic) problems with pain. If there is any problem keeping an appointment, call to reschedule. °SEEK MEDICAL CARE IF:  °· Your chest pain does not go away, even after treatment. °· You have a rash with blisters on your chest. °· You have a fever. °SEEK IMMEDIATE MEDICAL CARE IF:  °· You have increased chest pain or pain that spreads to your arm, neck, jaw, back, or abdomen. °· You have shortness of breath. °· You have an increasing cough, or you cough   up blood.  You have severe back or abdominal pain.  You feel nauseous or vomit.  You have severe weakness.  You faint.  You have chills. This is an emergency. Do not wait to see if the pain will go away. Get medical help at once. Call your local emergency services (911 in [place] Do not drive  yourself to the hospital. MAKE SURE YOU:   Understand these instructions.  Will watch your condition.  Will get help right away if you are not doing well or get worse. Document Released: 08/20/2005 Document Revised: 11/15/2013 Document Reviewed: 06/15/2008 [facility] Patient Information 2015 [place], [place]. This information is not intended to replace advice given to you by your health care provider. Make sure you discuss any questions you have with your health care provider.   Hypokalemia Hypokalemia means that the amount of potassium in the blood is lower than normal.Potassium is a chemical, called an electrolyte, that helps regulate the amount of fluid in the body. It also stimulates muscle contraction and helps nerves function properly.Most of the body's potassium is inside of cells, and only a very small amount is in the blood. Because the amount in the blood is so small, minor changes can be life-threatening. CAUSES  Antibiotics.  Diarrhea or vomiting.  Using laxatives too much, which can cause diarrhea.  Chronic kidney disease.  Water pills (diuretics).  Eating disorders (bulimia).  Low magnesium level.  Sweating a lot. SIGNS AND SYMPTOMS  Weakness.  Constipation.  Fatigue.  Muscle cramps.  Mental confusion.  Skipped heartbeats or irregular heartbeat (palpitations).  Tingling or numbness. DIAGNOSIS  Your health care provider can diagnose hypokalemia with blood tests. In addition to checking your potassium level, your health care provider may also check other lab tests. TREATMENT Hypokalemia can be treated with potassium supplements taken by mouth or adjustments in your current medicines. If your potassium level is very low, you may need to get potassium through a vein (IV) and be monitored in the hospital. A diet high in potassium is also helpful. Foods high in potassium are:  Nuts, such as peanuts and pistachios.  Seeds, such as sunflower seeds and pumpkin  seeds.  Peas, lentils, and lima beans.  Whole grain and bran cereals and breads.  Fresh fruit and vegetables, such as apricots, avocado, bananas, cantaloupe, kiwi, oranges, tomatoes, asparagus, and potatoes.  Orange and tomato juices.  Red meats.  Fruit yogurt. HOME CARE INSTRUCTIONS  Take all medicines as prescribed by your health care provider.  Maintain a healthy diet by including nutritious food, such as fruits, vegetables, nuts, whole grains, and lean meats.  If you are taking a laxative, be sure to follow the directions on the label. SEEK MEDICAL CARE IF:  Your weakness gets worse.  You feel your heart pounding or racing.  You are vomiting or having diarrhea.  You are diabetic and having trouble keeping your blood glucose in the normal range. SEEK IMMEDIATE MEDICAL CARE IF:  You have chest pain, shortness of breath, or dizziness.  You are vomiting or having diarrhea for more than 2 days.  You faint. MAKE SURE YOU:   Understand these instructions.  Will watch your condition.  Will get help right away if you are not doing well or get worse. Document Released: 11/10/2005 Document Revised: 08/31/2013 Document Reviewed: 05/13/2013 [facility] Patient Information 2015 [place], [place]. This information is not intended to replace advice given to you by your health care provider. Make sure you discuss any questions you have with  your health care provider.

## 2015-11-22 DIAGNOSIS — F1120 Opioid dependence, uncomplicated: Secondary | ICD-10-CM | POA: Insufficient documentation

## 2015-11-22 DIAGNOSIS — S2241XA Multiple fractures of ribs, right side, initial encounter for closed fracture: Secondary | ICD-10-CM | POA: Insufficient documentation

## 2015-11-24 DIAGNOSIS — R000 Tachycardia, unspecified: Secondary | ICD-10-CM | POA: Insufficient documentation

## 2015-12-03 DIAGNOSIS — D72829 Elevated white blood cell count, unspecified: Secondary | ICD-10-CM | POA: Insufficient documentation

## 2015-12-03 DIAGNOSIS — G9340 Encephalopathy, unspecified: Secondary | ICD-10-CM | POA: Insufficient documentation

## 2015-12-12 ENCOUNTER — Emergency Department (HOSPITAL_COMMUNITY): Payer: Medicaid Other

## 2015-12-12 ENCOUNTER — Encounter (HOSPITAL_COMMUNITY): Payer: Self-pay | Admitting: Emergency Medicine

## 2015-12-12 ENCOUNTER — Inpatient Hospital Stay (HOSPITAL_COMMUNITY)
Admission: EM | Admit: 2015-12-12 | Discharge: 2015-12-17 | DRG: 190 | Disposition: A | Discharge status: Home Health | Payer: Medicaid Other | Attending: Internal Medicine | Admitting: Internal Medicine

## 2015-12-12 DIAGNOSIS — X58XXXA Exposure to other specified factors, initial encounter: Secondary | ICD-10-CM | POA: Diagnosis present

## 2015-12-12 DIAGNOSIS — M419 Scoliosis, unspecified: Secondary | ICD-10-CM | POA: Diagnosis present

## 2015-12-12 DIAGNOSIS — M7989 Other specified soft tissue disorders: Secondary | ICD-10-CM

## 2015-12-12 DIAGNOSIS — Z888 Allergy status to other drugs, medicaments and biological substances status: Secondary | ICD-10-CM

## 2015-12-12 DIAGNOSIS — G4700 Insomnia, unspecified: Secondary | ICD-10-CM | POA: Diagnosis present

## 2015-12-12 DIAGNOSIS — Z79899 Other long term (current) drug therapy: Secondary | ICD-10-CM

## 2015-12-12 DIAGNOSIS — K219 Gastro-esophageal reflux disease without esophagitis: Secondary | ICD-10-CM | POA: Diagnosis present

## 2015-12-12 DIAGNOSIS — F17210 Nicotine dependence, cigarettes, uncomplicated: Secondary | ICD-10-CM | POA: Diagnosis present

## 2015-12-12 DIAGNOSIS — J440 Chronic obstructive pulmonary disease with acute lower respiratory infection: Secondary | ICD-10-CM | POA: Diagnosis present

## 2015-12-12 DIAGNOSIS — Z791 Long term (current) use of non-steroidal anti-inflammatories (NSAID): Secondary | ICD-10-CM

## 2015-12-12 DIAGNOSIS — J9600 Acute respiratory failure, unspecified whether with hypoxia or hypercapnia: Secondary | ICD-10-CM | POA: Diagnosis present

## 2015-12-12 DIAGNOSIS — R600 Localized edema: Secondary | ICD-10-CM

## 2015-12-12 DIAGNOSIS — T380X5A Adverse effect of glucocorticoids and synthetic analogues, initial encounter: Secondary | ICD-10-CM | POA: Diagnosis present

## 2015-12-12 DIAGNOSIS — K580 Irritable bowel syndrome with diarrhea: Secondary | ICD-10-CM | POA: Diagnosis present

## 2015-12-12 DIAGNOSIS — M545 Low back pain: Secondary | ICD-10-CM | POA: Diagnosis not present

## 2015-12-12 DIAGNOSIS — R45851 Suicidal ideations: Secondary | ICD-10-CM

## 2015-12-12 DIAGNOSIS — J9621 Acute and chronic respiratory failure with hypoxia: Secondary | ICD-10-CM | POA: Diagnosis present

## 2015-12-12 DIAGNOSIS — Z8249 Family history of ischemic heart disease and other diseases of the circulatory system: Secondary | ICD-10-CM

## 2015-12-12 DIAGNOSIS — I10 Essential (primary) hypertension: Secondary | ICD-10-CM | POA: Diagnosis present

## 2015-12-12 DIAGNOSIS — J441 Chronic obstructive pulmonary disease with (acute) exacerbation: Principal | ICD-10-CM | POA: Diagnosis present

## 2015-12-12 DIAGNOSIS — R109 Unspecified abdominal pain: Secondary | ICD-10-CM | POA: Diagnosis present

## 2015-12-12 DIAGNOSIS — F319 Bipolar disorder, unspecified: Secondary | ICD-10-CM | POA: Diagnosis present

## 2015-12-12 DIAGNOSIS — Z6828 Body mass index (BMI) 28.0-28.9, adult: Secondary | ICD-10-CM

## 2015-12-12 DIAGNOSIS — K589 Irritable bowel syndrome without diarrhea: Secondary | ICD-10-CM

## 2015-12-12 DIAGNOSIS — S2243XA Multiple fractures of ribs, bilateral, initial encounter for closed fracture: Secondary | ICD-10-CM | POA: Diagnosis present

## 2015-12-12 DIAGNOSIS — S2239XA Fracture of one rib, unspecified side, initial encounter for closed fracture: Secondary | ICD-10-CM

## 2015-12-12 DIAGNOSIS — E785 Hyperlipidemia, unspecified: Secondary | ICD-10-CM | POA: Diagnosis present

## 2015-12-12 DIAGNOSIS — Z823 Family history of stroke: Secondary | ICD-10-CM

## 2015-12-12 DIAGNOSIS — F419 Anxiety disorder, unspecified: Secondary | ICD-10-CM | POA: Diagnosis present

## 2015-12-12 DIAGNOSIS — Z7952 Long term (current) use of systemic steroids: Secondary | ICD-10-CM

## 2015-12-12 DIAGNOSIS — F32A Depression, unspecified: Secondary | ICD-10-CM

## 2015-12-12 DIAGNOSIS — J189 Pneumonia, unspecified organism: Secondary | ICD-10-CM | POA: Diagnosis present

## 2015-12-12 DIAGNOSIS — F329 Major depressive disorder, single episode, unspecified: Secondary | ICD-10-CM

## 2015-12-12 DIAGNOSIS — I509 Heart failure, unspecified: Secondary | ICD-10-CM | POA: Diagnosis not present

## 2015-12-12 DIAGNOSIS — R300 Dysuria: Secondary | ICD-10-CM | POA: Diagnosis present

## 2015-12-12 DIAGNOSIS — J45909 Unspecified asthma, uncomplicated: Secondary | ICD-10-CM | POA: Diagnosis present

## 2015-12-12 DIAGNOSIS — I5031 Acute diastolic (congestive) heart failure: Secondary | ICD-10-CM | POA: Diagnosis present

## 2015-12-12 DIAGNOSIS — M1990 Unspecified osteoarthritis, unspecified site: Secondary | ICD-10-CM | POA: Diagnosis present

## 2015-12-12 DIAGNOSIS — G8929 Other chronic pain: Secondary | ICD-10-CM | POA: Diagnosis present

## 2015-12-12 HISTORY — DX: Irritable bowel syndrome, unspecified: K58.9

## 2015-12-12 LAB — COMPREHENSIVE METABOLIC PANEL
ALBUMIN: 3.9 g/dL (ref 3.5–5.0)
ALK PHOS: 97 U/L (ref 38–126)
ALT: 23 U/L (ref 14–54)
AST: 17 U/L (ref 15–41)
Anion gap: 12 (ref 5–15)
BUN: 9 mg/dL (ref 6–20)
CALCIUM: 9.4 mg/dL (ref 8.9–10.3)
CHLORIDE: 95 mmol/L — AB (ref 101–111)
CO2: 30 mmol/L (ref 22–32)
CREATININE: 0.61 mg/dL (ref 0.44–1.00)
GFR calc Af Amer: >60 mL/min (ref >60)
GFR calc non Af Amer: >60 mL/min (ref >60)
GLUCOSE: 128 mg/dL — AB (ref 65–99)
Potassium: 4 mmol/L (ref 3.5–5.1)
SODIUM: 137 mmol/L (ref 135–145)
Total Bilirubin: 0.4 mg/dL (ref 0.3–1.2)
Total Protein: 6.6 g/dL (ref 6.5–8.1)

## 2015-12-12 LAB — ACETAMINOPHEN LEVEL

## 2015-12-12 LAB — CBC
HEMATOCRIT: 34.1 % — AB (ref 36.0–46.0)
HEMOGLOBIN: 10.7 g/dL — AB (ref 12.0–15.0)
MCH: 28.6 pg (ref 26.0–34.0)
MCHC: 31.4 g/dL (ref 30.0–36.0)
MCV: 91.2 fL (ref 78.0–100.0)
Platelets: 323 10*3/uL (ref 150–400)
RBC: 3.74 MIL/uL — AB (ref 3.87–5.11)
RDW: 15.6 % — ABNORMAL HIGH (ref 11.5–15.5)
WBC: 10.1 10*3/uL (ref 4.0–10.5)

## 2015-12-12 LAB — ETHANOL: Alcohol, Ethyl (B): <5 mg/dL (ref <5)

## 2015-12-12 LAB — RAPID URINE DRUG SCREEN, HOSP PERFORMED
AMPHETAMINES: NOT DETECTED
BARBITURATES: NOT DETECTED
Benzodiazepines: POSITIVE — AB
Cocaine: NOT DETECTED
Opiates: NOT DETECTED
TETRAHYDROCANNABINOL: NOT DETECTED

## 2015-12-12 LAB — TROPONIN I

## 2015-12-12 LAB — D-DIMER, QUANTITATIVE: D-Dimer, Quant: 2.42 ug/mL-FEU — ABNORMAL HIGH (ref 0.00–0.50)

## 2015-12-12 LAB — LIPASE, BLOOD: Lipase: 12 U/L (ref 11–51)

## 2015-12-12 LAB — SALICYLATE LEVEL: Salicylate Lvl: <4 mg/dL (ref 2.8–30.0)

## 2015-12-12 LAB — BRAIN NATRIURETIC PEPTIDE: B Natriuretic Peptide: 571.9 pg/mL — ABNORMAL HIGH (ref 0.0–100.0)

## 2015-12-12 MED ORDER — IPRATROPIUM-ALBUTEROL 0.5-2.5 (3) MG/3ML IN SOLN
3.0000 mL | Freq: Once | RESPIRATORY_TRACT | Status: AC
  Administered 2015-12-12: 3 mL via RESPIRATORY_TRACT
  Filled 2015-12-12: qty 3

## 2015-12-12 MED ORDER — METHYLPREDNISOLONE SODIUM SUCC 125 MG IJ SOLR
125.0000 mg | Freq: Once | INTRAMUSCULAR | Status: AC
  Administered 2015-12-12: 125 mg via INTRAVENOUS
  Filled 2015-12-12: qty 2

## 2015-12-12 MED ORDER — ALBUTEROL SULFATE (2.5 MG/3ML) 0.083% IN NEBU
5.0000 mg | INHALATION_SOLUTION | Freq: Once | RESPIRATORY_TRACT | Status: AC
  Administered 2015-12-12: 5 mg via RESPIRATORY_TRACT
  Filled 2015-12-12: qty 6

## 2015-12-12 MED ORDER — GABAPENTIN 400 MG PO CAPS
1600.0000 mg | ORAL_CAPSULE | Freq: Once | ORAL | Status: AC
  Administered 2015-12-12: 1600 mg via ORAL
  Filled 2015-12-12: qty 4

## 2015-12-12 MED ORDER — VANCOMYCIN HCL IN DEXTROSE 1-5 GM/200ML-% IV SOLN
1000.0000 mg | INTRAVENOUS | Status: AC
  Administered 2015-12-13: 1000 mg via INTRAVENOUS

## 2015-12-12 MED ORDER — METHOCARBAMOL 500 MG PO TABS
750.0000 mg | ORAL_TABLET | Freq: Once | ORAL | Status: AC
  Administered 2015-12-12: 750 mg via ORAL
  Filled 2015-12-12: qty 2

## 2015-12-12 MED ORDER — NICOTINE 14 MG/24HR TD PT24
14.0000 mg | MEDICATED_PATCH | Freq: Once | TRANSDERMAL | Status: AC
  Administered 2015-12-12: 14 mg via TRANSDERMAL
  Filled 2015-12-12 (×2): qty 1

## 2015-12-12 MED ORDER — OXYCODONE HCL 5 MG PO TABS
15.0000 mg | ORAL_TABLET | Freq: Once | ORAL | Status: AC
  Administered 2015-12-12: 15 mg via ORAL
  Filled 2015-12-12: qty 3

## 2015-12-12 MED ORDER — PIPERACILLIN-TAZOBACTAM 3.375 G IVPB 30 MIN
3.3750 g | INTRAVENOUS | Status: AC
  Administered 2015-12-13: 3.375 g via INTRAVENOUS

## 2015-12-12 MED ORDER — IOHEXOL 350 MG/ML SOLN
100.0000 mL | Freq: Once | INTRAVENOUS | Status: AC | PRN
  Administered 2015-12-12: 100 mL via INTRAVENOUS

## 2015-12-12 NOTE — ED Notes (Signed)
Bed: WA16 Expected date:  Expected time:  Means of arrival:  Comments: Hold for triage 4 

## 2015-12-12 NOTE — ED Notes (Signed)
Pt cannot use restroom at this time, aware urine specimen is needed.  

## 2015-12-12 NOTE — ED Notes (Signed)
Pt states she does not feel like going on. Daughter states the pt called her at work today have a manic episode stating she didn't want to live anymore. Daughter states pt has access to guns at home, and is not safe there.

## 2015-12-12 NOTE — Progress Notes (Signed)
VASCULAR LAB PRELIMINARY  PRELIMINARY  PRELIMINARY  PRELIMINARY  Right lower extremity venous duplex completed.    Preliminary report:  Right:  No evidence of DVT, superficial thrombosis, or Baker's cyst.  Ahaan Zobrist, RVT 12/12/2015, 8:00 PM

## 2015-12-12 NOTE — H&P (Signed)
[facility] History and Physical  [name] OI:[id number] DOB: [birth year] DOA: 12/12/2015  Referring physician: ED physician PCP: [name], MD  Specialists:   Chief Complaint: Cough, shortness of breath, suicidal ideation, abdominal pain, diarrhea, dysuria, bilateral leg edema  HPI: [name] is a 50 y.o. female with PMH of depression, bipolar disorder, migraine headache, IBS, chronic back pain, GERD, asthma, COPD, hyperlipidemia, hypertension, tobacco abuse, who presents with cough, shortness of breath, suicidal ideation, abdominal pain, diarrhea, dysuria, bilateral leg edema.  Patient reports that she was recently diagnosed with pneumonia and treated with antibiotics. She just completed a seven-day course of Levaquin yesterday. She states that she still has cough, shortness of breath, but no fever, chills or chest pain. She states that she has bilateral lower leg edema in the past for 5 days, which has been progressively getting worse. Right leg is more swollen than the left.She reports that she has IBS with chronic diarrhea and abdominal pain. Her AP has worsened recently. Her abdominal pains located in the right lower quadrant. She had 3 bowel movements with loose stool today. She also reports having dysuria, burning on urination and increased urinary frequency. She has suicidal ideation, but no plan.  In ED, patient was found to have BNP 571, troponin negative, d-dimer positive at 2.42, UDS positive for benzo, lipase 12, WBC 10.1, alcohol level less than 5, salicylate level <4, Tylenol level less than 10,  tachycardia, temperature normal, oxygen desaturation to 86 on room air, electrolytes and renal function okay. CT angiogram of chest showed no PE, but showed right lower lobe pneumonia. Venous Doppler of the right leg is negative for PE. Patient is admitted to inpatient for further urination treatment.   EKG: Independently reviewed.   QTC 428, no ischemic change.    Where does patient live?   At home    Can patient participate in ADLs?  Some   Review of Systems:   General: no fevers, chills, no changes in body weight, has poor appetite, has fatigue HEENT: no blurry vision, hearing changes or sore throat Pulm: has dyspnea, coughing, wheezing CV: no chest pain, palpitations Abd: no nausea, vomiting, has abdominal pain, diarrhea, noconstipation GU: has dysuria, burning on urination, increased urinary frequency, nohematuria  Ext: has leg edema Neuro: no unilateral weakness, numbness, or tingling, no vision change or hearing loss Skin: no rash MSK: No muscle spasm, no deformity, no limitation of range of movement in spin Heme: No easy bruising.  Travel history: No recent long distant travel.  Allergy:  Allergies  Allergen Reactions  . Cymbalta [Duloxetine Hcl] Other (See Comments)    Mood changes...mean  . Paxil [Paroxetine Hcl] Other (See Comments)    Mean and aggresive  . Wellbutrin [Bupropion] Other (See Comments)    Rapid heartrate  . Zoloft [Sertraline] Other (See Comments)    Anger.    Past Medical History  Diagnosis Date  . Asthma   . COPD (chronic obstructive pulmonary disease) ([place])   . Back pain   . Scoliosis   . Hypertension   . Arthritis   . Bipolar 1 disorder ([place])   . Depression   . Fibroid   . Migraine   . Chronic low back pain 03/06/2014  . IBS (irritable bowel syndrome)     Past Surgical History  Procedure Laterality Date  . Cholecystectomy    . Tubal ligation Bilateral     Social History:  reports that she has been smoking Cigarettes.  She has been  smoking about 1.00 pack per day. She has never used smokeless tobacco. She reports that she does not drink alcohol or use illicit drugs.  Family History:  Family History  Problem Relation Age of Onset  . Stroke Mother   . Hypertension Brother   . Hypertension Brother   . Hypertension Brother      Prior to Admission medications   Medication Sig Start Date  End Date Taking? Authorizing Provider  albuterol (PROVENTIL) (2.5 MG/3ML) 0.083% nebulizer solution Take 2.5 mg by nebulization every 6 (six) hours as needed for wheezing or shortness of breath.   Yes Historical Provider, MD  ALPRAZolam Duanne Moron) 1 MG tablet Take 1 mg by mouth 2 (two) times daily.   Yes Historical Provider, MD  budesonide-formoterol (SYMBICORT) 160-4.5 MCG/ACT inhaler Inhale 2 puffs into the lungs 2 (two) times daily.     Yes Historical Provider, MD  ergocalciferol (VITAMIN D2) 50000 units capsule Take 50,000 Units by mouth once a week. Saturday   Yes Historical Provider, MD  escitalopram (LEXAPRO) 20 MG tablet Take 20 mg by mouth 2 (two) times daily.    Yes Historical Provider, MD  esomeprazole (NEXIUM) 40 MG capsule Take 40 mg by mouth daily at 12 noon.   Yes Historical Provider, MD  gabapentin (NEURONTIN) 400 MG capsule Take 800 mg by mouth 3 (three) times daily. 800mg  in the morning, 800mg  in the evening, and 1600mg  at bedtime   Yes Historical Provider, MD  ibuprofen (ADVIL,MOTRIN) 800 MG tablet Take 800 mg by mouth 3 (three) times daily.    Yes Historical Provider, MD  levofloxacin (LEVAQUIN) 750 MG tablet Take 750 mg by mouth daily.   Yes Historical Provider, MD  loratadine (CLARITIN) 10 MG tablet Take 10 mg by mouth every morning.    Yes Historical Provider, MD  methocarbamol (ROBAXIN) 750 MG tablet Take 1,500 mg by mouth 3 (three) times daily.    Yes Historical Provider, MD  metoprolol tartrate (LOPRESSOR) 25 MG tablet Take 25 mg by mouth 2 (two) times daily.   Yes Historical Provider, MD  oxyCODONE (ROXICODONE) 15 MG immediate release tablet Take 15 mg by mouth every 4 (four) hours as needed for pain.   Yes Historical Provider, MD  predniSONE (DELTASONE) 10 MG tablet Take 10-40 mg by mouth daily with breakfast. Taper. Take 4 tabs daily for 3 days, Take 3 tabs daily for 3 days, Take 2 tabs daily for 3 days, Take 1 tab daily for 3 days then STOP.   Yes Historical Provider, MD   promethazine (PHENERGAN) 25 MG tablet Take 25 mg by mouth every 8 (eight) hours as needed for nausea or vomiting.   Yes Historical Provider, MD  QUEtiapine (SEROQUEL) 50 MG tablet Take 50 mg by mouth at bedtime.   Yes Historical Provider, MD  rosuvastatin (CRESTOR) 10 MG tablet Take 10 mg by mouth daily.   Yes Historical Provider, MD  antipyrine-benzocaine Toniann Fail) otic solution Place 3-4 drops into the left ear every 2 (two) hours as needed for ear pain. Patient not taking: Reported on 12/12/2015 08/21/14   Pamella Pert, MD  cephALEXin (KEFLEX) 500 MG capsule Take 1 capsule (500 mg total) by mouth 4 (four) times daily. Patient not taking: Reported on 12/12/2015 08/21/14   Pamella Pert, MD  nitrofurantoin, macrocrystal-monohydrate, (MACROBID) 100 MG capsule Take 1 capsule (100 mg total) by mouth 2 (two) times daily. Patient not taking: Reported on 12/12/2015 08/20/14   Tanna Furry, MD  phenazopyridine (PYRIDIUM) 200 MG tablet Take 1 tablet (  200 mg total) by mouth 3 (three) times daily. Patient not taking: Reported on 12/12/2015 08/20/14   Tanna Furry, MD  potassium chloride SA (K-DUR,KLOR-CON) 20 MEQ tablet Take 1 tablet (20 mEq total) by mouth daily. Patient not taking: Reported on 12/12/2015 03/08/15   Ernestina Patches, MD    Physical Exam: Filed Vitals:   12/13/15 0230 12/13/15 0245 12/13/15 0300 12/13/15 0315  BP:   147/135   Pulse: 95 88 96 95  Temp:      TempSrc:      Resp: 22 14 15 23   SpO2: 94% 97% 94% 95%   General: Not in acute distress HEENT:       Eyes: PERRL, EOMI, no scleral icterus.       ENT: No discharge from the ears and nose, no pharynx injection, no tonsillar enlargement.        Neck: No JVD, no bruit, no mass felt. Heme: No neck lymph node enlargement. Cardiac: S1/S2, RRR, No murmurs, No gallops or rubs. Pulm: Has wheezing laterally. No rales or rubs. Abd: Soft, nondistended, mild tenderness over RLQ, no rebound pain, no organomegaly, BS present. Ext: 3+ right leg  edema and 1+ edema on the right leg.  2+DP/PT pulse bilaterally. Musculoskeletal: No joint deformities, No joint redness or warmth, no limitation of ROM in spin. Skin: No rashes.  Neuro: Alert, oriented X3, cranial nerves II-XII grossly intact, moves all extremities Psych: In depressed mood, has suicidal ideation, but no homicidal ideation.  Labs on Admission:  Basic Metabolic Panel:  Recent Labs Lab 12/12/15 1627  NA 137  K 4.0  CL 95*  CO2 30  GLUCOSE 128*  BUN 9  CREATININE 0.61  CALCIUM 9.4   Liver Function Tests:  Recent Labs Lab 12/12/15 1627  AST 17  ALT 23  ALKPHOS 97  BILITOT 0.4  PROT 6.6  ALBUMIN 3.9    Recent Labs Lab 12/12/15 1714  LIPASE 12   No results for input(s): AMMONIA in the last 168 hours. CBC:  Recent Labs Lab 12/12/15 1627  WBC 10.1  HGB 10.7*  HCT 34.1*  MCV 91.2  PLT 323   Cardiac Enzymes:  Recent Labs Lab 12/12/15 1640 12/13/15 0100  TROPONINI <0.03 <0.03    BNP (last 3 results)  Recent Labs  12/12/15 1640  BNP 571.9*    ProBNP (last 3 results) No results for input(s): PROBNP in the last 8760 hours.  CBG: No results for input(s): GLUCAP in the last 168 hours.  Radiological Exams on Admission: Dg Chest 2 View  12/12/2015  CLINICAL DATA:  Cough EXAM: CHEST  2 VIEW COMPARISON:  03/07/2015 FINDINGS: Cardiomediastinal silhouette is stable. No acute infiltrate or pleural effusion. No pulmonary edema. Mild degenerative changes mid thoracic spine. Mild infrahilar bronchitic changes. IMPRESSION: No acute infiltrate or pulmonary edema. Mild infrahilar bronchitic changes. Electronically Signed   By: Lahoma Crocker M.D.   On: 12/12/2015 17:05   Ct Angio Chest Pe W/cm &/or Wo Cm  12/12/2015  CLINICAL DATA:  50 year old female with history of COPD presenting with shortness of breath and elevated D-dimer. EXAM: CT ANGIOGRAPHY CHEST WITH CONTRAST TECHNIQUE: Multidetector CT imaging of the chest was performed using the standard  protocol during bolus administration of intravenous contrast. Multiplanar CT image reconstructions and MIPs were obtained to evaluate the vascular anatomy. CONTRAST:  143mL OMNIPAQUE IOHEXOL 350 MG/ML SOLN COMPARISON:  Radiograph dated 12/12/2015 FINDINGS: There is centrilobular emphysema. A patchy area of ground-glass and nodular density noted in the right middle  lobe on most compatible with pneumonia and possibly bronchopneumonia. Small right pleural effusion with associated partial compressive atelectasis of the right lung base. The central airways are patent. The thoracic aorta appears unremarkable. No CT evidence of pulmonary embolism. No cardiomegaly or pericardial effusion. No hilar or mediastinal adenopathy. The esophagus is grossly unremarkable. There is no axillary adenopathy. The chest wall soft tissues appear unremarkable. Mild degenerative changes of the spine. 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 No acute fracture. The visualized upper abdomen appears unremarkable. Review of the MIP images confirms the above findings. IMPRESSION: No CT evidence of pulmonary embolism. Right middle lobe pneumonia Small right pleural effusion. Electronically Signed   By: Anner Crete M.D.   On: 12/12/2015 23:33    Assessment/Plan Principal Problem:   Acute on chronic respiratory failure with hypoxia (HCC) Active Problems:   Chronic low back pain   COPD exacerbation (HCC)   Hypertension   Arthritis   Bipolar 1 disorder (HCC)   Depression   Leg edema   Acute CHF (congestive heart failure) (HCC)   IBS (irritable bowel syndrome)   Abdominal pain   Suicidal ideation   CAP (community acquired pneumonia)   SOB (shortness of breath)   Dysuria   Acute on chronic respiratory failure with hypoxia ([place]): Likely multifactorial including right lower lobe pneumonia as shown by CXR and CTA-chest, COPD exacerbation given productive cough and no wheezing and acute CHF given bilateral leg edema  and elevated BNP. Patient denies any chest pain, less likely to have pulmonary embolism. Her elevated d-dimer is likely due to ongoing infection.  - will admit to tele bed - Nebulizers: Atrovent and Xopenex nebulizer - EDP started Zosyn to vancomycin, we'll continue  - Solu-Medrol 60 mg twice a day - Mucinex for cough  - Urine legionella and S. pneumococcal antigen - Follow up blood culture x2, sputum culture, plus Flu pcr - will get Procalcitonin and trend lactic acid level per sepsis protocol  -will treat possible CHF as below  COPD exacerbation and pneumonia: -As above  Possible acute CHF: Patient has a bilateral leg edema and elevated BNP, indicating possible acute CHF -Lasix 40 mg daily by IV -trop x 3 -2d echo -Risk factor stratification: A1c, FLP -will continue home metoprolol -start lisinopril 2.5 mg daily and ASA -Daily weights -strict I/O's -Low salt diet  Depression and suicidal ideation:  -Sitter at the bedside -Suicidal precaution -Continue home Seroquel, Xanax, Lexapro -Call [facility] in morning  IBS: Patient has a chronic diarrhea, reports having abdominal pain over right lower quadrant. -Check C. difficile PCR -Zofran for nausea  Chronic low back pain: -Continue ibuprofen, Robaxin, when necessary oxycodone  Hypertension: -Metoprolol, lisinopril, -Started on IV Lasix as above  Dysuria: Patient reports having dysuria, burning on urination and increased frequency. -Follow-up urinalysis -On IV antibiotics as above   DVT ppx: SQ Lovenox  Code Status: Full code Family Communication: None at bed side.       Disposition Plan: Admit to inpatient   Date of Service 12/13/2015    Ivor Costa [facility] Pager [phone]  If 7PM-7AM, please contact night-coverage [email or web address] Password [email or web address] 12/13/2015, 3:59 AM

## 2015-12-12 NOTE — ED Notes (Signed)
Per pt/family-states she was recently hospitalized for PNA in [place] she has a manic episode-daughter states she wants to hurt herself

## 2015-12-12 NOTE — ED Provider Notes (Signed)
CSN: [id number]     Arrival date & time 12/12/15  11 History   First MD Initiated Contact with Patient 12/12/15 1623     Chief Complaint  Patient presents with  . PNA    . Suicidal      The history is provided by the patient.   patient presents with shortness of breath cough and was also suicidal. History of bipolar disorder and COPD. Around a week ago she was discharged from [facility]. She was admitted and was briefly on BiPAP. Just finished up her antibiotics today. States she is depressed about being sick. States she has been sick since June. States she does want to die. States she has some pain in her upper abdomen. She's been coughing with nothing coming up. She also has swelling in both her legs. States she usually has some swelling but nowhere near this much. States swelling is much worse on her right leg.  Past Medical History  Diagnosis Date  . Asthma   . COPD (chronic obstructive pulmonary disease) ([place])   . Back pain   . Scoliosis   . Hypertension   . Arthritis   . Bipolar 1 disorder ([place])   . Depression   . Fibroid   . Migraine   . Chronic low back pain 03/06/2014   Past Surgical History  Procedure Laterality Date  . Cholecystectomy    . Tubal ligation Bilateral    Family History  Problem Relation Age of Onset  . Stroke Mother   . Hypertension Brother   . Hypertension Brother   . Hypertension Brother    Social History  Substance Use Topics  . Smoking status: Current Every Day Smoker -- 1.00 packs/day    Types: Cigarettes  . Smokeless tobacco: Never Used  . Alcohol Use: No   OB History    No data available     Review of Systems  Constitutional: Negative for activity change and appetite change.  Eyes: Negative for pain.  Respiratory: Positive for cough and shortness of breath. Negative for chest tightness.   Cardiovascular: Negative for chest pain and leg swelling.  Gastrointestinal: Positive for abdominal pain. Negative for nausea,  vomiting and diarrhea.  Genitourinary: Negative for flank pain.  Musculoskeletal: Negative for back pain and neck stiffness.  Skin: Negative for rash.  Neurological: Negative for weakness, numbness and headaches.  Psychiatric/Behavioral: Positive for suicidal ideas. Negative for behavioral problems.      Allergies  Cymbalta; Paxil; Wellbutrin; and Zoloft  Home Medications   Prior to Admission medications   Medication Sig Start Date End Date Taking? Authorizing Provider  albuterol (PROVENTIL) (2.5 MG/3ML) 0.083% nebulizer solution Take 2.5 mg by nebulization every 6 (six) hours as needed for wheezing or shortness of breath.   Yes Historical Provider, MD  ALPRAZolam Duanne Moron) 1 MG tablet Take 1 mg by mouth 2 (two) times daily.   Yes Historical Provider, MD  budesonide-formoterol (SYMBICORT) 160-4.5 MCG/ACT inhaler Inhale 2 puffs into the lungs 2 (two) times daily.     Yes Historical Provider, MD  ergocalciferol (VITAMIN D2) 50000 units capsule Take 50,000 Units by mouth once a week. Saturday   Yes Historical Provider, MD  escitalopram (LEXAPRO) 20 MG tablet Take 20 mg by mouth 2 (two) times daily.    Yes Historical Provider, MD  esomeprazole (NEXIUM) 40 MG capsule Take 40 mg by mouth daily at 12 noon.   Yes Historical Provider, MD  gabapentin (NEURONTIN) 400 MG capsule Take 800 mg by mouth 3 (three)  times daily. 800mg  in the morning, 800mg  in the evening, and 1600mg  at bedtime   Yes Historical Provider, MD  ibuprofen (ADVIL,MOTRIN) 800 MG tablet Take 800 mg by mouth 3 (three) times daily.    Yes Historical Provider, MD  levofloxacin (LEVAQUIN) 750 MG tablet Take 750 mg by mouth daily.   Yes Historical Provider, MD  loratadine (CLARITIN) 10 MG tablet Take 10 mg by mouth every morning.    Yes Historical Provider, MD  methocarbamol (ROBAXIN) 750 MG tablet Take 1,500 mg by mouth 3 (three) times daily.    Yes Historical Provider, MD  metoprolol tartrate (LOPRESSOR) 25 MG tablet Take 25 mg by mouth  2 (two) times daily.   Yes Historical Provider, MD  oxyCODONE (ROXICODONE) 15 MG immediate release tablet Take 15 mg by mouth every 4 (four) hours as needed for pain.   Yes Historical Provider, MD  predniSONE (DELTASONE) 10 MG tablet Take 10-40 mg by mouth daily with breakfast. Taper. Take 4 tabs daily for 3 days, Take 3 tabs daily for 3 days, Take 2 tabs daily for 3 days, Take 1 tab daily for 3 days then STOP.   Yes Historical Provider, MD  promethazine (PHENERGAN) 25 MG tablet Take 25 mg by mouth every 8 (eight) hours as needed for nausea or vomiting.   Yes Historical Provider, MD  QUEtiapine (SEROQUEL) 50 MG tablet Take 50 mg by mouth at bedtime.   Yes Historical Provider, MD  rosuvastatin (CRESTOR) 10 MG tablet Take 10 mg by mouth daily.   Yes Historical Provider, MD  antipyrine-benzocaine Toniann Fail) otic solution Place 3-4 drops into the left ear every 2 (two) hours as needed for ear pain. Patient not taking: Reported on 12/12/2015 08/21/14   Pamella Pert, MD  cephALEXin (KEFLEX) 500 MG capsule Take 1 capsule (500 mg total) by mouth 4 (four) times daily. Patient not taking: Reported on 12/12/2015 08/21/14   Pamella Pert, MD  nitrofurantoin, macrocrystal-monohydrate, (MACROBID) 100 MG capsule Take 1 capsule (100 mg total) by mouth 2 (two) times daily. Patient not taking: Reported on 12/12/2015 08/20/14   Tanna Furry, MD  phenazopyridine (PYRIDIUM) 200 MG tablet Take 1 tablet (200 mg total) by mouth 3 (three) times daily. Patient not taking: Reported on 12/12/2015 08/20/14   Tanna Furry, MD  potassium chloride SA (K-DUR,KLOR-CON) 20 MEQ tablet Take 1 tablet (20 mEq total) by mouth daily. Patient not taking: Reported on 12/12/2015 03/08/15   Ernestina Patches, MD   BP 156/93 mmHg  Pulse 101  Temp(Src) 97.9 F (36.6 C) (Oral)  Resp 16  SpO2 96% Physical Exam  Constitutional: She appears well-developed.  HENT:  Head: Normocephalic.  Eyes: EOM are normal.  Neck: No JVD present.  Cardiovascular:   Mild tachycardia  Pulmonary/Chest: She has wheezes.  Diffuse wheezes and prolonged expirations.  Abdominal:  Tenderness to upper abdomen without rebound or guarding  Musculoskeletal: She exhibits edema.  Moderate pitting edema to bilateral lower legs but much worse on right lower extremity.  Neurological: She is alert.  Skin: Skin is warm.    ED Course  Procedures (including critical care time) Labs Review Labs Reviewed  COMPREHENSIVE METABOLIC PANEL - Abnormal; Notable for the following:    Chloride 95 (*)    Glucose, Bld 128 (*)    All other components within normal limits  ACETAMINOPHEN LEVEL - Abnormal; Notable for the following:    Acetaminophen (Tylenol), Serum <10 (*)    All other components within normal limits  CBC - Abnormal; Notable for  the following:    RBC 3.74 (*)    Hemoglobin 10.7 (*)    HCT 34.1 (*)    RDW 15.6 (*)    All other components within normal limits  URINE RAPID DRUG SCREEN, HOSP PERFORMED - Abnormal; Notable for the following:    Benzodiazepines POSITIVE (*)    All other components within normal limits  D-DIMER, QUANTITATIVE (NOT AT [facility]) - Abnormal; Notable for the following:    D-Dimer, Quant 2.42 (*)    All other components within normal limits  BRAIN NATRIURETIC PEPTIDE - Abnormal; Notable for the following:    B Natriuretic Peptide 571.9 (*)    All other components within normal limits  ETHANOL  SALICYLATE LEVEL  TROPONIN I  LIPASE, BLOOD  URINALYSIS, ROUTINE W REFLEX MICROSCOPIC (NOT AT [facility])    Imaging Review Dg Chest 2 View  12/12/2015  CLINICAL DATA:  Cough EXAM: CHEST  2 VIEW COMPARISON:  03/07/2015 FINDINGS: Cardiomediastinal silhouette is stable. No acute infiltrate or pleural effusion. No pulmonary edema. Mild degenerative changes mid thoracic spine. Mild infrahilar bronchitic changes. IMPRESSION: No acute infiltrate or pulmonary edema. Mild infrahilar bronchitic changes. Electronically Signed   By: Lahoma Crocker M.D.   On:  12/12/2015 17:05   Ct Angio Chest Pe W/cm &/or Wo Cm  12/12/2015  CLINICAL DATA:  50 year old female with history of COPD presenting with shortness of breath and elevated D-dimer. EXAM: CT ANGIOGRAPHY CHEST WITH CONTRAST TECHNIQUE: Multidetector CT imaging of the chest was performed using the standard protocol during bolus administration of intravenous contrast. Multiplanar CT image reconstructions and MIPs were obtained to evaluate the vascular anatomy. CONTRAST:  162mL OMNIPAQUE IOHEXOL 350 MG/ML SOLN COMPARISON:  Radiograph dated 12/12/2015 FINDINGS: There is centrilobular emphysema. A patchy area of ground-glass and nodular density noted in the right middle lobe on most compatible with pneumonia and possibly bronchopneumonia. Small right pleural effusion with associated partial compressive atelectasis of the right lung base. The central airways are patent. The thoracic aorta appears unremarkable. No CT evidence of pulmonary embolism. No cardiomegaly or pericardial effusion. No hilar or mediastinal adenopathy. The esophagus is grossly unremarkable. There is no axillary adenopathy. The chest wall soft tissues appear unremarkable. Mild degenerative changes of the spine. 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 No acute fracture. The visualized upper abdomen appears unremarkable. Review of the MIP images confirms the above findings. IMPRESSION: No CT evidence of pulmonary embolism. Right middle lobe pneumonia Small right pleural effusion. Electronically Signed   By: Anner Crete M.D.   On: 12/12/2015 23:33   I have personally reviewed and evaluated these images and lab results as part of my medical decision-making.   EKG Interpretation   Date/Time:  Wednesday December 12 2015 17:41:20 EST Ventricular Rate:  98 PR Interval:  116 QRS Duration: 79 QT Interval:  335 QTC Calculation: 428 R Axis:   34 Text Interpretation:  Sinus rhythm Borderline short PR interval Probable  left atrial  enlargement Confirmed by Alvino Chapel  MD, Ovid Curd [phone]) on  12/12/2015 5:48:03 PM      MDM   Final diagnoses:  HCAP (healthcare-associated pneumonia)  Suicidal ideations    *Patient with shortness of breath. Recent treatment for pneumonia. Hypoxic without oxygen. Has moderate edema of both legs but worse on the right side. D-dimer is positive CT angiography was done. CT showed pneumonia with pleural effusion. Will admit to internal medicine. Patient also is suicidal. This will need to be dealt with when she is medically cleared.  Davonna Belling, MD 12/12/15 [phone]

## 2015-12-13 ENCOUNTER — Inpatient Hospital Stay (HOSPITAL_COMMUNITY): Payer: Medicaid Other

## 2015-12-13 ENCOUNTER — Encounter (HOSPITAL_COMMUNITY): Payer: Self-pay | Admitting: Internal Medicine

## 2015-12-13 DIAGNOSIS — S2243XA Multiple fractures of ribs, bilateral, initial encounter for closed fracture: Secondary | ICD-10-CM | POA: Diagnosis not present

## 2015-12-13 DIAGNOSIS — Z791 Long term (current) use of non-steroidal anti-inflammatories (NSAID): Secondary | ICD-10-CM | POA: Diagnosis not present

## 2015-12-13 DIAGNOSIS — J441 Chronic obstructive pulmonary disease with (acute) exacerbation: Secondary | ICD-10-CM | POA: Diagnosis present

## 2015-12-13 DIAGNOSIS — F319 Bipolar disorder, unspecified: Secondary | ICD-10-CM | POA: Diagnosis not present

## 2015-12-13 DIAGNOSIS — K589 Irritable bowel syndrome without diarrhea: Secondary | ICD-10-CM | POA: Diagnosis present

## 2015-12-13 DIAGNOSIS — Z7952 Long term (current) use of systemic steroids: Secondary | ICD-10-CM | POA: Diagnosis not present

## 2015-12-13 DIAGNOSIS — R300 Dysuria: Secondary | ICD-10-CM | POA: Insufficient documentation

## 2015-12-13 DIAGNOSIS — F32A Depression, unspecified: Secondary | ICD-10-CM | POA: Diagnosis present

## 2015-12-13 DIAGNOSIS — G4700 Insomnia, unspecified: Secondary | ICD-10-CM | POA: Diagnosis present

## 2015-12-13 DIAGNOSIS — M419 Scoliosis, unspecified: Secondary | ICD-10-CM | POA: Diagnosis present

## 2015-12-13 DIAGNOSIS — Z6828 Body mass index (BMI) 28.0-28.9, adult: Secondary | ICD-10-CM | POA: Diagnosis not present

## 2015-12-13 DIAGNOSIS — I10 Essential (primary) hypertension: Secondary | ICD-10-CM | POA: Insufficient documentation

## 2015-12-13 DIAGNOSIS — T380X5A Adverse effect of glucocorticoids and synthetic analogues, initial encounter: Secondary | ICD-10-CM | POA: Diagnosis present

## 2015-12-13 DIAGNOSIS — M545 Low back pain: Secondary | ICD-10-CM | POA: Diagnosis present

## 2015-12-13 DIAGNOSIS — J189 Pneumonia, unspecified organism: Secondary | ICD-10-CM | POA: Diagnosis not present

## 2015-12-13 DIAGNOSIS — R45851 Suicidal ideations: Secondary | ICD-10-CM | POA: Diagnosis present

## 2015-12-13 DIAGNOSIS — J45909 Unspecified asthma, uncomplicated: Secondary | ICD-10-CM | POA: Diagnosis present

## 2015-12-13 DIAGNOSIS — E785 Hyperlipidemia, unspecified: Secondary | ICD-10-CM | POA: Diagnosis present

## 2015-12-13 DIAGNOSIS — R109 Unspecified abdominal pain: Secondary | ICD-10-CM | POA: Diagnosis present

## 2015-12-13 DIAGNOSIS — F329 Major depressive disorder, single episode, unspecified: Secondary | ICD-10-CM | POA: Diagnosis present

## 2015-12-13 DIAGNOSIS — R600 Localized edema: Secondary | ICD-10-CM | POA: Insufficient documentation

## 2015-12-13 DIAGNOSIS — I509 Heart failure, unspecified: Secondary | ICD-10-CM

## 2015-12-13 DIAGNOSIS — M7989 Other specified soft tissue disorders: Secondary | ICD-10-CM

## 2015-12-13 DIAGNOSIS — G8929 Other chronic pain: Secondary | ICD-10-CM | POA: Diagnosis present

## 2015-12-13 DIAGNOSIS — F17210 Nicotine dependence, cigarettes, uncomplicated: Secondary | ICD-10-CM | POA: Diagnosis present

## 2015-12-13 DIAGNOSIS — I5031 Acute diastolic (congestive) heart failure: Secondary | ICD-10-CM | POA: Diagnosis present

## 2015-12-13 DIAGNOSIS — M549 Dorsalgia, unspecified: Secondary | ICD-10-CM | POA: Insufficient documentation

## 2015-12-13 DIAGNOSIS — J9621 Acute and chronic respiratory failure with hypoxia: Secondary | ICD-10-CM | POA: Diagnosis not present

## 2015-12-13 DIAGNOSIS — F3160 Bipolar disorder, current episode mixed, unspecified: Secondary | ICD-10-CM | POA: Diagnosis not present

## 2015-12-13 DIAGNOSIS — Z79899 Other long term (current) drug therapy: Secondary | ICD-10-CM | POA: Diagnosis not present

## 2015-12-13 DIAGNOSIS — R0602 Shortness of breath: Secondary | ICD-10-CM | POA: Diagnosis present

## 2015-12-13 DIAGNOSIS — K219 Gastro-esophageal reflux disease without esophagitis: Secondary | ICD-10-CM | POA: Diagnosis present

## 2015-12-13 DIAGNOSIS — Z823 Family history of stroke: Secondary | ICD-10-CM | POA: Diagnosis not present

## 2015-12-13 DIAGNOSIS — J9600 Acute respiratory failure, unspecified whether with hypoxia or hypercapnia: Secondary | ICD-10-CM | POA: Diagnosis present

## 2015-12-13 DIAGNOSIS — F419 Anxiety disorder, unspecified: Secondary | ICD-10-CM | POA: Diagnosis present

## 2015-12-13 DIAGNOSIS — M1990 Unspecified osteoarthritis, unspecified site: Secondary | ICD-10-CM | POA: Diagnosis present

## 2015-12-13 DIAGNOSIS — Z8249 Family history of ischemic heart disease and other diseases of the circulatory system: Secondary | ICD-10-CM | POA: Diagnosis not present

## 2015-12-13 DIAGNOSIS — J440 Chronic obstructive pulmonary disease with acute lower respiratory infection: Secondary | ICD-10-CM | POA: Diagnosis present

## 2015-12-13 DIAGNOSIS — X58XXXA Exposure to other specified factors, initial encounter: Secondary | ICD-10-CM | POA: Diagnosis present

## 2015-12-13 DIAGNOSIS — Z888 Allergy status to other drugs, medicaments and biological substances status: Secondary | ICD-10-CM | POA: Diagnosis not present

## 2015-12-13 DIAGNOSIS — K580 Irritable bowel syndrome with diarrhea: Secondary | ICD-10-CM | POA: Diagnosis present

## 2015-12-13 LAB — TROPONIN I
Troponin I: <0.03 ng/mL (ref <0.031)
Troponin I: <0.03 ng/mL (ref <0.031)

## 2015-12-13 LAB — LIPID PANEL
CHOL/HDL RATIO: 1.9 ratio
Cholesterol: 177 mg/dL (ref 0–200)
HDL: 95 mg/dL (ref >40)
LDL CALC: 65 mg/dL (ref 0–99)
TRIGLYCERIDES: 87 mg/dL (ref <150)
VLDL: 17 mg/dL (ref 0–40)

## 2015-12-13 LAB — LACTIC ACID, PLASMA
LACTIC ACID, VENOUS: 1.8 mmol/L (ref 0.5–2.0)
LACTIC ACID, VENOUS: 1.2 mmol/L (ref 0.5–2.0)

## 2015-12-13 LAB — APTT: aPTT: 26 seconds (ref 24–37)

## 2015-12-13 LAB — URINALYSIS, ROUTINE W REFLEX MICROSCOPIC
BILIRUBIN URINE: NEGATIVE
GLUCOSE, UA: NEGATIVE mg/dL
HGB URINE DIPSTICK: NEGATIVE
KETONES UR: NEGATIVE mg/dL
Leukocytes, UA: NEGATIVE
Nitrite: NEGATIVE
PROTEIN: NEGATIVE mg/dL
Specific Gravity, Urine: 1.004 — ABNORMAL LOW (ref 1.005–1.030)
pH: 7 (ref 5.0–8.0)

## 2015-12-13 LAB — PROTIME-INR
INR: 0.96 (ref 0.00–1.49)
PROTHROMBIN TIME: 13 s (ref 11.6–15.2)

## 2015-12-13 LAB — PROCALCITONIN

## 2015-12-13 LAB — STREP PNEUMONIAE URINARY ANTIGEN: STREP PNEUMO URINARY ANTIGEN: NEGATIVE

## 2015-12-13 LAB — HIV ANTIBODY (ROUTINE TESTING W REFLEX): HIV Screen 4th Generation wRfx: NONREACTIVE

## 2015-12-13 MED ORDER — LISINOPRIL 2.5 MG PO TABS
2.5000 mg | ORAL_TABLET | Freq: Every day | ORAL | Status: DC
  Administered 2015-12-13 – 2015-12-17 (×5): 2.5 mg via ORAL
  Filled 2015-12-13 (×5): qty 1

## 2015-12-13 MED ORDER — ASPIRIN EC 81 MG PO TBEC
81.0000 mg | DELAYED_RELEASE_TABLET | Freq: Every day | ORAL | Status: DC
  Administered 2015-12-13 – 2015-12-17 (×5): 81 mg via ORAL
  Filled 2015-12-13 (×5): qty 1

## 2015-12-13 MED ORDER — TRAZODONE HCL 50 MG PO TABS
50.0000 mg | ORAL_TABLET | Freq: Every evening | ORAL | Status: DC | PRN
  Administered 2015-12-15 – 2015-12-16 (×3): 50 mg via ORAL
  Filled 2015-12-13 (×3): qty 1

## 2015-12-13 MED ORDER — PIPERACILLIN-TAZOBACTAM 3.375 G IVPB
3.3750 g | Freq: Three times a day (TID) | INTRAVENOUS | Status: DC
  Administered 2015-12-13 – 2015-12-14 (×4): 3.375 g via INTRAVENOUS
  Filled 2015-12-13 (×4): qty 50

## 2015-12-13 MED ORDER — SODIUM CHLORIDE 0.9 % IJ SOLN
3.0000 mL | Freq: Two times a day (BID) | INTRAMUSCULAR | Status: DC
  Administered 2015-12-14 – 2015-12-17 (×4): 3 mL via INTRAVENOUS

## 2015-12-13 MED ORDER — LEVALBUTEROL HCL 1.25 MG/0.5ML IN NEBU
1.2500 mg | INHALATION_SOLUTION | Freq: Four times a day (QID) | RESPIRATORY_TRACT | Status: DC
  Administered 2015-12-13: 1.25 mg via RESPIRATORY_TRACT
  Filled 2015-12-13: qty 0.5

## 2015-12-13 MED ORDER — FUROSEMIDE 10 MG/ML IJ SOLN
40.0000 mg | Freq: Every day | INTRAMUSCULAR | Status: DC
  Administered 2015-12-13 – 2015-12-14 (×3): 40 mg via INTRAVENOUS
  Filled 2015-12-13 (×3): qty 4

## 2015-12-13 MED ORDER — ONDANSETRON HCL 4 MG/2ML IJ SOLN: 4.0000 mg | Freq: Four times a day (QID) | INTRAMUSCULAR | Status: DC | PRN

## 2015-12-13 MED ORDER — SODIUM CHLORIDE 0.9 % IJ SOLN: 3.0000 mL | INTRAMUSCULAR | Status: DC | PRN

## 2015-12-13 MED ORDER — VITAMIN D (ERGOCALCIFEROL) 1.25 MG (50000 UNIT) PO CAPS
50000.0000 [IU] | ORAL_CAPSULE | ORAL | Status: DC
  Administered 2015-12-15: 50000 [IU] via ORAL
  Filled 2015-12-13: qty 1

## 2015-12-13 MED ORDER — SODIUM CHLORIDE 0.9 % IV SOLN: 250.0000 mL | INTRAVENOUS | Status: DC | PRN

## 2015-12-13 MED ORDER — ALPRAZOLAM 0.5 MG PO TABS
1.0000 mg | ORAL_TABLET | Freq: Two times a day (BID) | ORAL | Status: DC
  Administered 2015-12-13 (×2): 1 mg via ORAL
  Filled 2015-12-13 (×2): qty 2

## 2015-12-13 MED ORDER — IPRATROPIUM BROMIDE HFA 17 MCG/ACT IN AERS: 2.0000 | INHALATION_SPRAY | RESPIRATORY_TRACT | Status: DC

## 2015-12-13 MED ORDER — ALPRAZOLAM 0.5 MG PO TABS
0.5000 mg | ORAL_TABLET | Freq: Three times a day (TID) | ORAL | Status: DC | PRN
  Administered 2015-12-13 – 2015-12-17 (×9): 0.5 mg via ORAL
  Filled 2015-12-13 (×10): qty 1

## 2015-12-13 MED ORDER — LEVALBUTEROL HCL 1.25 MG/0.5ML IN NEBU
1.2500 mg | INHALATION_SOLUTION | Freq: Four times a day (QID) | RESPIRATORY_TRACT | Status: DC
Start: 2015-12-13 — End: 2015-12-14
  Administered 2015-12-13 – 2015-12-14 (×5): 1.25 mg via RESPIRATORY_TRACT
  Filled 2015-12-13 (×9): qty 0.5

## 2015-12-13 MED ORDER — ESCITALOPRAM OXALATE 20 MG PO TABS
20.0000 mg | ORAL_TABLET | Freq: Two times a day (BID) | ORAL | Status: DC
  Administered 2015-12-13 – 2015-12-17 (×10): 20 mg via ORAL
  Filled 2015-12-13 (×2): qty 1
  Filled 2015-12-13: qty 2
  Filled 2015-12-13 (×3): qty 1
  Filled 2015-12-13: qty 2
  Filled 2015-12-13 (×3): qty 1

## 2015-12-13 MED ORDER — ACETAMINOPHEN 325 MG PO TABS
650.0000 mg | ORAL_TABLET | ORAL | Status: DC | PRN
  Administered 2015-12-13 – 2015-12-17 (×10): 650 mg via ORAL
  Filled 2015-12-13 (×10): qty 2

## 2015-12-13 MED ORDER — LORATADINE 10 MG PO TABS
10.0000 mg | ORAL_TABLET | Freq: Every morning | ORAL | Status: DC
  Administered 2015-12-13 – 2015-12-17 (×5): 10 mg via ORAL
  Filled 2015-12-13 (×5): qty 1

## 2015-12-13 MED ORDER — QUETIAPINE FUMARATE 100 MG PO TABS
100.0000 mg | ORAL_TABLET | Freq: Every day | ORAL | Status: DC
  Administered 2015-12-13 – 2015-12-16 (×4): 100 mg via ORAL
  Filled 2015-12-13 (×4): qty 1

## 2015-12-13 MED ORDER — QUETIAPINE FUMARATE 50 MG PO TABS
50.0000 mg | ORAL_TABLET | Freq: Every day | ORAL | Status: DC
  Administered 2015-12-13: 50 mg via ORAL
  Filled 2015-12-13: qty 1

## 2015-12-13 MED ORDER — METHOCARBAMOL 500 MG PO TABS
1500.0000 mg | ORAL_TABLET | Freq: Three times a day (TID) | ORAL | Status: DC
  Administered 2015-12-13 – 2015-12-17 (×14): 1500 mg via ORAL
  Filled 2015-12-13 (×14): qty 3

## 2015-12-13 MED ORDER — DM-GUAIFENESIN ER 30-600 MG PO TB12
1.0000 | ORAL_TABLET | Freq: Two times a day (BID) | ORAL | Status: DC
  Administered 2015-12-13 – 2015-12-17 (×10): 1 via ORAL
  Filled 2015-12-13 (×11): qty 1

## 2015-12-13 MED ORDER — PROMETHAZINE HCL 25 MG PO TABS: 25.0000 mg | ORAL_TABLET | Freq: Three times a day (TID) | ORAL | Status: DC | PRN

## 2015-12-13 MED ORDER — GABAPENTIN 400 MG PO CAPS
800.0000 mg | ORAL_CAPSULE | Freq: Three times a day (TID) | ORAL | Status: DC
  Administered 2015-12-13 – 2015-12-17 (×14): 800 mg via ORAL
  Filled 2015-12-13 (×14): qty 2

## 2015-12-13 MED ORDER — IPRATROPIUM BROMIDE 0.02 % IN SOLN
0.5000 mg | Freq: Four times a day (QID) | RESPIRATORY_TRACT | Status: DC
  Administered 2015-12-13 – 2015-12-15 (×9): 0.5 mg via RESPIRATORY_TRACT
  Filled 2015-12-13 (×9): qty 2.5

## 2015-12-13 MED ORDER — ROSUVASTATIN CALCIUM 10 MG PO TABS
10.0000 mg | ORAL_TABLET | Freq: Every day | ORAL | Status: DC
  Administered 2015-12-13 – 2015-12-17 (×5): 10 mg via ORAL
  Filled 2015-12-13 (×5): qty 1

## 2015-12-13 MED ORDER — ENOXAPARIN SODIUM 40 MG/0.4ML ~~LOC~~ SOLN
40.0000 mg | SUBCUTANEOUS | Status: DC
  Administered 2015-12-13 – 2015-12-17 (×5): 40 mg via SUBCUTANEOUS
  Filled 2015-12-13 (×5): qty 0.4

## 2015-12-13 MED ORDER — PANTOPRAZOLE SODIUM 40 MG PO TBEC
40.0000 mg | DELAYED_RELEASE_TABLET | Freq: Every day | ORAL | Status: DC
  Administered 2015-12-13 – 2015-12-17 (×5): 40 mg via ORAL
  Filled 2015-12-13 (×5): qty 1

## 2015-12-13 MED ORDER — IBUPROFEN 800 MG PO TABS
800.0000 mg | ORAL_TABLET | Freq: Three times a day (TID) | ORAL | Status: DC
  Filled 2015-12-13: qty 1

## 2015-12-13 MED ORDER — OXYCODONE HCL 5 MG PO TABS
15.0000 mg | ORAL_TABLET | ORAL | Status: DC | PRN
  Administered 2015-12-13 – 2015-12-17 (×22): 15 mg via ORAL
  Filled 2015-12-13 (×22): qty 3

## 2015-12-13 MED ORDER — METOPROLOL TARTRATE 25 MG PO TABS
25.0000 mg | ORAL_TABLET | Freq: Two times a day (BID) | ORAL | Status: DC
  Administered 2015-12-13 – 2015-12-14 (×4): 25 mg via ORAL
  Filled 2015-12-13 (×4): qty 1

## 2015-12-13 MED ORDER — VANCOMYCIN HCL IN DEXTROSE 1-5 GM/200ML-% IV SOLN
1000.0000 mg | Freq: Three times a day (TID) | INTRAVENOUS | Status: DC
  Administered 2015-12-13 – 2015-12-14 (×4): 1000 mg via INTRAVENOUS
  Filled 2015-12-13 (×4): qty 200

## 2015-12-13 MED ORDER — METHYLPREDNISOLONE SODIUM SUCC 125 MG IJ SOLR
60.0000 mg | Freq: Two times a day (BID) | INTRAMUSCULAR | Status: DC
  Administered 2015-12-13 – 2015-12-16 (×9): 60 mg via INTRAVENOUS
  Filled 2015-12-13 (×9): qty 2

## 2015-12-13 NOTE — Progress Notes (Signed)
*  Preliminary Results* Left lower extremity venous duplex completed. Left lower extremity is negative for deep vein thrombosis. There is no evidence of left Baker's cyst.  12/13/2015 1:53 PM  Maudry Mayhew, RVT, RDCS, RDMS

## 2015-12-13 NOTE — Progress Notes (Signed)
2D Echocardiogram has been performed.  [name] 12/13/2015, 3:14 PM

## 2015-12-13 NOTE — ED Notes (Signed)
Pt asking for anxiety medication.  Informed that Xanax is not due until 2200.  Will paged Hospitalist for additional orders.

## 2015-12-13 NOTE — Consult Note (Signed)
[facility] Face-to-Face Psychiatry Consult   Reason for Consult:  Bipolar mixed symptoms with suicide ideation Referring Physician:  Dr. Allyson Sabal Patient Identification: [name] MRN:  [id number] Principal Diagnosis: Suicidal ideation Diagnosis:   Patient Active Problem List   Diagnosis Date Noted  . Leg edema [R60.0] 12/13/2015  . Acute CHF (congestive heart failure) ([place]) [I50.9] 12/13/2015  . Acute on chronic respiratory failure with hypoxia ([place]) [J96.21] 12/13/2015  . IBS (irritable bowel syndrome) [K58.9] 12/13/2015  . Abdominal pain [R10.9] 12/13/2015  . Suicidal ideation [R45.851] 12/13/2015  . CAP (community acquired pneumonia) [J18.9] 12/13/2015  . SOB (shortness of breath) [R06.02] 12/13/2015  . Dysuria [R30.0] 12/13/2015  . COPD exacerbation ([place]) [J44.1]   . Back pain [M54.9]   . Hypertension [I10]   . Arthritis [M19.90]   . Bipolar 1 disorder ([place]) [F31.9]   . Depression [F32.9]   . Essential hypertension [I10]   . Chronic low back pain [M54.5, G89.29] 03/06/2014    Total Time spent with patient: 1 hour  Subjective:   [name] is a 50 y.o. female patient admitted with depression, anxiety and pneumonia.  HPI: [name] is a 50 y.o. female with PMH of depression, bipolar disorder, migraine headache, IBS, chronic back pain, GERD, asthma, COPD, hyperlipidemia, hypertension, tobacco abuse, who presents with cough, shortness of breath, suicidal ideation, abdominal pain, diarrhea, dysuria, bilateral leg edema.  Patient complaints feeling depressed, anxious and lack of sleep and also partially compliant with her psych medications. She stated that she can not take Geodon because of unknown negative problems, antidepressant medications SSRI and SNRI's due to manic switch and lamotrigine not helpful. Patient stated her stresses are relationship with her fiance for over year who does not let her daughter visit her and reports he wants to make love with her daughter. She  feels depressed due to recently increased medical illness due to pneumonia and required antibiotic treatment. She still has cough, shortness of breath, but no fever, chills or chest pain. She states that she has been better since admitted to [facility] and receiving current medication regimen. She requested to adjust her medication for better control of her mood and insomnia. She denied current suicide or homicide ideation, intention or plans. She has no homicide ideation or evidenc of psychosis. She reportedly endorses suicide thought because of medical conditions and complication and felt she can not go on with her life but does not mean to kill herself. She loves her daughter and grandchild and does not want end her life. She wishes to get better and than go to out patient psychiatric care when medically discharged from hospital.  Past Psychiatric History: She had [facility] admission few years ago and has been treated at [facility].   Risk to Self: Is patient at risk for suicide?: Yes Risk to Others:   Prior Inpatient Therapy:   Prior Outpatient Therapy:    Past Medical History:  Past Medical History  Diagnosis Date  . Asthma   . COPD (chronic obstructive pulmonary disease) ([place])   . Back pain   . Scoliosis   . Hypertension   . Arthritis   . Bipolar 1 disorder ([place])   . Depression   . Fibroid   . Migraine   . Chronic low back pain 03/06/2014  . IBS (irritable bowel syndrome)     Past Surgical History  Procedure Laterality Date  . Cholecystectomy    . Tubal ligation Bilateral   . Dilation and curettage of  uterus    . Right foot surgery    . Exploratory laparotomy      WHEN PATIENT WAS YOUNG   Family History:  Family History  Problem Relation Age of Onset  . Stroke Mother   . Hypertension Brother   . Hypertension Brother   . Hypertension Brother    Family Psychiatric  History: denied Social History:  History  Alcohol Use No     History  Drug Use No     Social History   Social History  . Marital Status: Single    Spouse Name: N/A  . Number of Children: 2  . Years of Education: college   Occupational History  . disabled    Social History Main Topics  . Smoking status: Current Every Day Smoker -- 1.00 packs/day    Types: Cigarettes  . Smokeless tobacco: Never Used  . Alcohol Use: No  . Drug Use: No  . Sexual Activity: No   Other Topics Concern  . None   Social History Narrative   Additional Social History:Disabled and living with significant other.                           Allergies:   Allergies  Allergen Reactions  . Cymbalta [Duloxetine Hcl] Other (See Comments)    Mood changes...mean  . Paxil [Paroxetine Hcl] Other (See Comments)    Mean and aggresive  . Wellbutrin [Bupropion] Other (See Comments)    Rapid heartrate  . Zoloft [Sertraline] Other (See Comments)    Anger.    Labs:  Results for orders placed or performed during the hospital encounter of 12/12/15 (from the past 48 hour(s))  Comprehensive metabolic panel     Status: Abnormal   Collection Time: 12/12/15  4:27 PM  Result Value Ref Range   Sodium 137 135 - 145 mmol/L   Potassium 4.0 3.5 - 5.1 mmol/L   Chloride 95 (L) 101 - 111 mmol/L   CO2 30 22 - 32 mmol/L   Glucose, Bld 128 (H) 65 - 99 mg/dL   BUN 9 6 - 20 mg/dL   Creatinine, Ser 0.61 0.44 - 1.00 mg/dL   Calcium 9.4 8.9 - 10.3 mg/dL   Total Protein 6.6 6.5 - 8.1 g/dL   Albumin 3.9 3.5 - 5.0 g/dL   AST 17 15 - 41 U/L   ALT 23 14 - 54 U/L   Alkaline Phosphatase 97 38 - 126 U/L   Total Bilirubin 0.4 0.3 - 1.2 mg/dL   GFR calc non Af Amer >60 >60 mL/min   GFR calc Af Amer >60 >60 mL/min    Comment: (NOTE) The eGFR has been calculated using the CKD EPI equation. This calculation has not been validated in all clinical situations. eGFR's persistently <60 mL/min signify possible Chronic Kidney Disease.    Anion gap 12 5 - 15  CBC     Status: Abnormal   Collection Time: 12/12/15   4:27 PM  Result Value Ref Range   WBC 10.1 4.0 - 10.5 K/uL   RBC 3.74 (L) 3.87 - 5.11 MIL/uL   Hemoglobin 10.7 (L) 12.0 - 15.0 g/dL   HCT 34.1 (L) 36.0 - 46.0 %   MCV 91.2 78.0 - 100.0 fL   MCH 28.6 26.0 - 34.0 pg   MCHC 31.4 30.0 - 36.0 g/dL   RDW 15.6 (H) 11.5 - 15.5 %   Platelets 323 150 - 400 K/uL  Ethanol (ETOH)  Status: None   Collection Time: 12/12/15  4:31 PM  Result Value Ref Range   Alcohol, Ethyl (B) <5 <5 mg/dL    Comment:        LOWEST DETECTABLE LIMIT FOR SERUM ALCOHOL IS 5 mg/dL FOR MEDICAL PURPOSES ONLY   Salicylate level     Status: None   Collection Time: 12/12/15  4:31 PM  Result Value Ref Range   Salicylate Lvl <3.2 2.8 - 30.0 mg/dL  Acetaminophen level     Status: Abnormal   Collection Time: 12/12/15  4:31 PM  Result Value Ref Range   Acetaminophen (Tylenol), Serum <10 (L) 10 - 30 ug/mL    Comment:        THERAPEUTIC CONCENTRATIONS VARY SIGNIFICANTLY. A RANGE OF 10-30 ug/mL MAY BE AN EFFECTIVE CONCENTRATION FOR MANY PATIENTS. HOWEVER, SOME ARE BEST TREATED AT CONCENTRATIONS OUTSIDE THIS RANGE. ACETAMINOPHEN CONCENTRATIONS >150 ug/mL AT 4 HOURS AFTER INGESTION AND >50 ug/mL AT 12 HOURS AFTER INGESTION ARE OFTEN ASSOCIATED WITH TOXIC REACTIONS.   D-dimer, quantitative (not at [facility])     Status: Abnormal   Collection Time: 12/12/15  4:40 PM  Result Value Ref Range   D-Dimer, Quant 2.42 (H) 0.00 - 0.50 ug/mL-FEU    Comment: (NOTE) At the manufacturer cut-off of 0.50 ug/mL FEU, this assay has been documented to exclude PE with a sensitivity and negative predictive value of 97 to 99%.  At this time, this assay has not been approved by the FDA to exclude DVT/VTE. Results should be correlated with clinical presentation.   Brain natriuretic peptide     Status: Abnormal   Collection Time: 12/12/15  4:40 PM  Result Value Ref Range   B Natriuretic Peptide 571.9 (H) 0.0 - 100.0 pg/mL  Troponin I     Status: None   Collection Time: 12/12/15  4:40 PM   Result Value Ref Range   Troponin I <0.03 <0.031 ng/mL    Comment:        NO INDICATION OF MYOCARDIAL INJURY.   Lipase, blood     Status: None   Collection Time: 12/12/15  5:14 PM  Result Value Ref Range   Lipase 12 11 - 51 U/L  Urine rapid drug screen (hosp performed) (Not at [facility])     Status: Abnormal   Collection Time: 12/12/15  6:47 PM  Result Value Ref Range   Opiates NONE DETECTED NONE DETECTED   Cocaine NONE DETECTED NONE DETECTED   Benzodiazepines POSITIVE (A) NONE DETECTED   Amphetamines NONE DETECTED NONE DETECTED   Tetrahydrocannabinol NONE DETECTED NONE DETECTED   Barbiturates NONE DETECTED NONE DETECTED    Comment:        DRUG SCREEN FOR MEDICAL PURPOSES ONLY.  IF CONFIRMATION IS NEEDED FOR ANY PURPOSE, NOTIFY LAB WITHIN 5 DAYS.        LOWEST DETECTABLE LIMITS FOR URINE DRUG SCREEN Drug Class       Cutoff (ng/mL) Amphetamine      1000 Barbiturate      200 Benzodiazepine   355 Tricyclics       732 Opiates          300 Cocaine          300 THC              50   Protime-INR     Status: None   Collection Time: 12/13/15  1:00 AM  Result Value Ref Range   Prothrombin Time 13.0 11.6 - 15.2 seconds   INR 0.96 0.00 -  1.49  APTT     Status: None   Collection Time: 12/13/15  1:00 AM  Result Value Ref Range   aPTT 26 24 - 37 seconds  Troponin I     Status: None   Collection Time: 12/13/15  1:00 AM  Result Value Ref Range   Troponin I <0.03 <0.031 ng/mL    Comment:        NO INDICATION OF MYOCARDIAL INJURY.   Lipid panel     Status: None   Collection Time: 12/13/15  1:00 AM  Result Value Ref Range   Cholesterol 177 0 - 200 mg/dL   Triglycerides 87 <150 mg/dL   HDL 95 >40 mg/dL   Total CHOL/HDL Ratio 1.9 RATIO   VLDL 17 0 - 40 mg/dL   LDL Cholesterol 65 0 - 99 mg/dL    Comment:        Total Cholesterol/HDL:CHD Risk Coronary Heart Disease Risk Table                     Men   Women  1/2 Average Risk   3.4   3.3  Average Risk       5.0   4.4  2 X  Average Risk   9.6   7.1  3 X Average Risk  23.4   11.0        Use the calculated Patient Ratio above and the CHD Risk Table to determine the patient's CHD Risk.        ATP III CLASSIFICATION (LDL):  <100     mg/dL   Optimal  100-129  mg/dL   Near or Above                    Optimal  130-159  mg/dL   Borderline  160-189  mg/dL   High  >190     mg/dL   Very High Performed at [facility]   Procalcitonin     Status: None   Collection Time: 12/13/15  5:26 AM  Result Value Ref Range   Procalcitonin <0.10 ng/mL    Comment:        Interpretation: PCT (Procalcitonin) <= 0.5 ng/mL: Systemic infection (sepsis) is not likely. Local bacterial infection is possible. (NOTE)         ICU PCT Algorithm               Non ICU PCT Algorithm    ----------------------------     ------------------------------         PCT < 0.25 ng/mL                 PCT < 0.1 ng/mL     Stopping of antibiotics            Stopping of antibiotics       strongly encouraged.               strongly encouraged.    ----------------------------     ------------------------------       PCT level decrease by               PCT < 0.25 ng/mL       >= 80% from peak PCT       OR PCT 0.25 - 0.5 ng/mL          Stopping of antibiotics  encouraged.     Stopping of antibiotics           encouraged.    ----------------------------     ------------------------------       PCT level decrease by              PCT >= 0.25 ng/mL       < 80% from peak PCT        AND PCT >= 0.5 ng/mL            Continuin g antibiotics                                              encouraged.       Continuing antibiotics            encouraged.    ----------------------------     ------------------------------     PCT level increase compared          PCT > 0.5 ng/mL         with peak PCT AND          PCT >= 0.5 ng/mL             Escalation of antibiotics                                          strongly  encouraged.      Escalation of antibiotics        strongly encouraged.   Lactic acid, plasma     Status: None   Collection Time: 12/13/15  5:27 AM  Result Value Ref Range   Lactic Acid, Venous 1.8 0.5 - 2.0 mmol/L  Strep pneumoniae urinary antigen     Status: None   Collection Time: 12/13/15  5:43 AM  Result Value Ref Range   Strep Pneumo Urinary Antigen NEGATIVE NEGATIVE    Comment:        Infection due to S. pneumoniae cannot be absolutely ruled out since the antigen present may be below the detection limit of the test. Performed at [facility]   Troponin I     Status: None   Collection Time: 12/13/15  7:17 AM  Result Value Ref Range   Troponin I <0.03 <0.031 ng/mL    Comment:        NO INDICATION OF MYOCARDIAL INJURY.   Lactic acid, plasma     Status: None   Collection Time: 12/13/15  7:17 AM  Result Value Ref Range   Lactic Acid, Venous 1.2 0.5 - 2.0 mmol/L    Current Facility-Administered Medications  Medication Dose Route Frequency Provider Last Rate Last Dose  . 0.9 %  sodium chloride infusion  250 mL Intravenous PRN Ivor Costa, MD      . acetaminophen (TYLENOL) tablet 650 mg  650 mg Oral Q4H PRN Ivor Costa, MD   650 mg at 12/13/15 0510  . ALPRAZolam Duanne Moron) tablet 1 mg  1 mg Oral BID Ivor Costa, MD   1 mg at 12/13/15 1020  . aspirin EC tablet 81 mg  81 mg Oral Daily Ivor Costa, MD   81 mg at 12/13/15 1020  . dextromethorphan-guaiFENesin (MUCINEX DM) 30-600 MG per 12 hr tablet 1 tablet  1 tablet Oral BID Ivor Costa, MD  1 tablet at 12/13/15 1022  . enoxaparin (LOVENOX) injection 40 mg  40 mg Subcutaneous Q24H Ivor Costa, MD   40 mg at 12/13/15 1023  . escitalopram (LEXAPRO) tablet 20 mg  20 mg Oral BID Ivor Costa, MD   20 mg at 12/13/15 1046  . furosemide (LASIX) injection 40 mg  40 mg Intravenous Daily Ivor Costa, MD   40 mg at 12/13/15 1024  . gabapentin (NEURONTIN) capsule 800 mg  800 mg Oral TID Ivor Costa, MD   800 mg at 12/13/15 1045  . ipratropium (ATROVENT)  nebulizer solution 0.5 mg  0.5 mg Nebulization Q6H Ivor Costa, MD   0.5 mg at 12/13/15 0754  . levalbuterol (XOPENEX) nebulizer solution 1.25 mg  1.25 mg Nebulization Q6H Ivor Costa, MD   1.25 mg at 12/13/15 0754  . lisinopril (PRINIVIL,ZESTRIL) tablet 2.5 mg  2.5 mg Oral Daily Ivor Costa, MD   2.5 mg at 12/13/15 1022  . loratadine (CLARITIN) tablet 10 mg  10 mg Oral q morning - 10a Ivor Costa, MD   10 mg at 12/13/15 1020  . methocarbamol (ROBAXIN) tablet 1,500 mg  1,500 mg Oral TID Ivor Costa, MD   1,500 mg at 12/13/15 1023  . methylPREDNISolone sodium succinate (SOLU-MEDROL) 125 mg/2 mL injection 60 mg  60 mg Intravenous Q12H Ivor Costa, MD   60 mg at 12/13/15 0255  . metoprolol tartrate (LOPRESSOR) tablet 25 mg  25 mg Oral BID Ivor Costa, MD   25 mg at 12/13/15 1021  . nicotine (NICODERM CQ - dosed in mg/24 hours) patch 14 mg  14 mg Transdermal Once Davonna Belling, MD   14 mg at 12/12/15 2328  . ondansetron (ZOFRAN) injection 4 mg  4 mg Intravenous Q6H PRN Ivor Costa, MD      . oxyCODONE (Oxy IR/ROXICODONE) immediate release tablet 15 mg  15 mg Oral Q4H PRN Ivor Costa, MD   15 mg at 12/13/15 0747  . pantoprazole (PROTONIX) EC tablet 40 mg  40 mg Oral Daily Ivor Costa, MD   40 mg at 12/13/15 1020  . piperacillin-tazobactam (ZOSYN) IVPB 3.375 g  3.375 g Intravenous Q8H Leann T Poindexter, RPH   Stopped at 12/13/15 1023  . promethazine (PHENERGAN) tablet 25 mg  25 mg Oral Q8H PRN Ivor Costa, MD      . QUEtiapine (SEROQUEL) tablet 50 mg  50 mg Oral QHS Ivor Costa, MD   50 mg at 12/13/15 0745  . rosuvastatin (CRESTOR) tablet 10 mg  10 mg Oral Daily Ivor Costa, MD   10 mg at 12/13/15 1022  . sodium chloride 0.9 % injection 3 mL  3 mL Intravenous Q12H Ivor Costa, MD   3 mL at 12/13/15 0749  . sodium chloride 0.9 % injection 3 mL  3 mL Intravenous PRN Ivor Costa, MD      . vancomycin (VANCOCIN) IVPB 1000 mg/200 mL premix  1,000 mg Intravenous Q8H Leann Everlean Patterson, RPH   Stopped at 12/13/15 1206  . [START ON  12/15/2015] Vitamin D (Ergocalciferol) (DRISDOL) capsule 50,000 Units  50,000 Units Oral Weekly Ivor Costa, MD       Current Outpatient Prescriptions  Medication Sig Dispense Refill  . albuterol (PROVENTIL) (2.5 MG/3ML) 0.083% nebulizer solution Take 2.5 mg by nebulization every 6 (six) hours as needed for wheezing or shortness of breath.    . ALPRAZolam (XANAX) 1 MG tablet Take 1 mg by mouth 2 (two) times daily.    . budesonide-formoterol (SYMBICORT) 160-4.5 MCG/ACT inhaler Inhale  2 puffs into the lungs 2 (two) times daily.      . ergocalciferol (VITAMIN D2) 50000 units capsule Take 50,000 Units by mouth once a week. Saturday    . escitalopram (LEXAPRO) 20 MG tablet Take 20 mg by mouth 2 (two) times daily.     Marland Kitchen esomeprazole (NEXIUM) 40 MG capsule Take 40 mg by mouth daily at 12 noon.    . gabapentin (NEURONTIN) 400 MG capsule Take 800 mg by mouth 3 (three) times daily. 816m in the morning, 8025min the evening, and 160047mt bedtime    . ibuprofen (ADVIL,MOTRIN) 800 MG tablet Take 800 mg by mouth 3 (three) times daily.     . lMarland Kitchenvofloxacin (LEVAQUIN) 750 MG tablet Take 750 mg by mouth daily.    . lMarland Kitchenratadine (CLARITIN) 10 MG tablet Take 10 mg by mouth every morning.     . methocarbamol (ROBAXIN) 750 MG tablet Take 1,500 mg by mouth 3 (three) times daily.     . metoprolol tartrate (LOPRESSOR) 25 MG tablet Take 25 mg by mouth 2 (two) times daily.    . oMarland KitchenyCODONE (ROXICODONE) 15 MG immediate release tablet Take 15 mg by mouth every 4 (four) hours as needed for pain.    . predniSONE (DELTASONE) 10 MG tablet Take 10-40 mg by mouth daily with breakfast. Taper. Take 4 tabs daily for 3 days, Take 3 tabs daily for 3 days, Take 2 tabs daily for 3 days, Take 1 tab daily for 3 days then STOP.    . pMarland Kitchenomethazine (PHENERGAN) 25 MG tablet Take 25 mg by mouth every 8 (eight) hours as needed for nausea or vomiting.    . QMarland KitchenEtiapine (SEROQUEL) 50 MG tablet Take 50 mg by mouth at bedtime.    . rosuvastatin (CRESTOR) 10  MG tablet Take 10 mg by mouth daily.    . aMarland Kitchentipyrine-benzocaine (AURALGAN) otic solution Place 3-4 drops into the left ear every 2 (two) hours as needed for ear pain. (Patient not taking: Reported on 12/12/2015) 10 mL 0  . cephALEXin (KEFLEX) 500 MG capsule Take 1 capsule (500 mg total) by mouth 4 (four) times daily. (Patient not taking: Reported on 12/12/2015) 28 capsule 0  . nitrofurantoin, macrocrystal-monohydrate, (MACROBID) 100 MG capsule Take 1 capsule (100 mg total) by mouth 2 (two) times daily. (Patient not taking: Reported on 12/12/2015) 20 capsule 0  . phenazopyridine (PYRIDIUM) 200 MG tablet Take 1 tablet (200 mg total) by mouth 3 (three) times daily. (Patient not taking: Reported on 12/12/2015) 6 tablet 0  . potassium chloride SA (K-DUR,KLOR-CON) 20 MEQ tablet Take 1 tablet (20 mEq total) by mouth daily. (Patient not taking: Reported on 12/12/2015) 3 tablet 0    Musculoskeletal: Strength & Muscle Tone: within normal limits Gait & Station: normal Patient leans: N/A  Psychiatric Specialty Exam: Review of Systems  Constitutional: Positive for malaise/fatigue.  HENT: Positive for congestion.   Musculoskeletal: Positive for back pain.  Neurological: Positive for weakness.  Psychiatric/Behavioral: Positive for depression. The patient is nervous/anxious and has insomnia.   All other systems reviewed and are negative.    Blood pressure 145/79, pulse 79, temperature 98.3 F (36.8 C), temperature source Oral, resp. rate 14, height 5' 3"  (1.6 m), weight 83.462 kg (184 lb), SpO2 94 %.Body mass index is 32.6 kg/(m^2).  General Appearance: Guarded  Eye Contact::  Good  Speech:  Pressured  Volume:  Normal  Mood:  Anxious, Depressed and Worthless  Affect:  Labile  Thought Process:  Coherent and Goal Directed  Orientation:  Full (Time, Place, and Person)  Thought Content:  WDL  Suicidal Thoughts:  No  Homicidal Thoughts:  No  Memory:  Immediate;   Good Recent;   Good  Judgement:  Intact   Insight:  Fair  Psychomotor Activity:  Decreased  Concentration:  Fair  Recall:  Good  Fund of Knowledge:Good  Language: Good  Akathisia:  Negative  Handed:  Right  AIMS (if indicated):     Assets:  Communication Skills Desire for Improvement Financial Resources/Insurance Housing Intimacy Leisure Time Resilience Social Support Transportation  ADL's:  Intact  Cognition: WNL  Sleep:      Treatment Plan Summary: Daily contact with patient to assess and evaluate symptoms and progress in treatment and Medication management Patient contract for safety and denied current suicide ideation, intention or plans Adjust Seroquel 100 mg PO Qhs for mood swings and insomnia Add Trazodone 50 mg PO Qhs / PRN for sleep Continue Neurontin 800 mg TID and Xanax 1 mg PO BID Appreciate psychiatric consultation and follow up as clinically required Please contact [phone] or [phone] if needs further assistance   Disposition: Patient will be referred to out patient medication management when medically stable Patient does not meet criteria for psychiatric inpatient admission. Supportive therapy provided about ongoing stressors.  Sandro Burgo,JANARDHAHA R. 12/13/2015 12:18 PM

## 2015-12-13 NOTE — Progress Notes (Signed)
[facility] PROGRESS NOTE  "[name] MRN:[id number] DOB: 06/22/1966 DOA: 12/12/2015 PCP: ARVIND,MOOGALI M, MD  Length of stay: 0   Assessment/Plan: Principal Problem:   Acute on chronic respiratory failure with hypoxia (HCC) Active Problems:   Chronic low back pain   COPD exacerbation (HCC)   Hypertension   Arthritis   Bipolar 1 disorder (HCC)   Depression   Leg edema   Acute CHF (congestive heart failure) (HCC)   IBS (irritable bowel syndrome)   Abdominal pain   Suicidal ideation   CAP (community acquired pneumonia)   SOB (shortness of breath)   Dysuria   Brief summary 50 y.o. female with PMH of depression, bipolar disorder, migraine headache, IBS, chronic back pain, GERD, asthma, COPD, hyperlipidemia, hypertension, tobacco abuse, who presents with cough, shortness of breath, suicidal ideation, abdominal pain, diarrhea, dysuria, bilateral leg edema.In ED, patient was found to have BNP 571, troponin negative, d-dimer positive at 2.42, UDS positive for benzo, lipase 12, WBC 10.1, alcohol level less than 5, salicylate level <4, Tylenol level less than 10, tachycardia, temperature normal, oxygen desaturation to 86 on room air, electrolytes and renal function okay. CT angiogram of chest showed no PE, but showed right lower lobe pneumonia. Venous Doppler of the right leg is negative for PE. Patient is admitted to inpatient for further urination treatment  Assessment and plan Acute on chronic respiratory failure with hypoxia (HCC): Likely multifactorial including right lower lobe pneumonia as shown by CXR and CTA-chest, COPD exacerbation given productive cough and no wheezing and acute CHF given bilateral leg edema and elevated BNP.- will admit to tele bed - Nebulizers: Atrovent and Xopenex nebulizer Continue   Zosyn to vancomycin,   - Solu-Medrol 60 mg twice a day - Mucinex for cough  - Urine legionella pending and S. pneumococcal antigen negative - Follow up blood  culture x2, sputum culture, plus Flu pcr Pro calcitonin negative  COPD exacerbation and pneumonia: -As above  Possible acute CHF: Patient has a bilateral leg edema and elevated BNP, indicating possible acute CHF -Lasix 40 mg daily by IV -trop x 3 -2d echo pending, no prior 2-D echo Pending A1c, FLP-LDL  65 -will continue home metoprolol -start lisinopril 2.5 mg daily and ASA -Daily weights -strict I/O's -Low salt diet  Depression and suicidal ideation:  -Sitter at the bedside -Suicidal precaution -Continue home Seroquel, Xanax, Lexapro -Call [facility] in morning  IBS: Patient has a chronic diarrhea, reports having abdominal pain over right lower quadrant. C. difficile pending -Zofran for nausea  Chronic low back pain: Discontinue ibuprofen,  continue  Robaxin, when necessary oxycodone  Hypertension: -Metoprolol, lisinopril, -Started on IV Lasix as above  Dysuria: Patient reports having dysuria, burning on urination and increased frequency. -Follow-up urinalysis -On IV antibiotics as above   DVT prophylaxsis lovenox   Code Status:      Code Status Orders        Start     Ordered   12/13/15 0047  Full code   Continuous     01" /19/17 0049    Code Status History    Date Active Date Inactive Code Status Order ID Comments User Context   This patient has a current code status but no historical code status.      Family Communication: Discussed in detail with the patient, all imaging results, lab results explained to the patient   Disposition Plan:  As above       Consultants:  psychiatry  Procedures:  None   Antibiotics: Anti-infectives    Start     Dose/Rate Route Frequency Ordered Stop   12/13/15 0900  vancomycin (VANCOCIN) IVPB 1000 mg/200 mL premix     1,000 mg 200 mL/hr over 60 Minutes Intravenous Every 8 hours 12/13/15 0412     12/13/15 0700  piperacillin-tazobactam (ZOSYN) IVPB 3.375 g     3.375 g 12.5 mL/hr over 240 Minutes Intravenous Every 8  hours 12/13/15 0412     12/12/15 2345  piperacillin-tazobactam (ZOSYN) IVPB 3.375 g     3.375 g 100 mL/hr over 30 Minutes Intravenous STAT 12/12/15 2341 12/13/15 0148   12/12/15 2345  vancomycin (VANCOCIN) IVPB 1000 mg/200 mL premix     1,000 mg 200 mL/hr over 60 Minutes Intravenous STAT 12/12/15 2341 12/13/15 0148         HPI/Subjective: Patient has a productive cough and is hypertensive  Objective: Filed Vitals:   12/13/15 0751 12/13/15 0755 12/13/15 0800 12/13/15 0830  BP:   177/96 160/86  Pulse:   78 96  Temp:      TempSrc:      Resp:    12  Height:      Weight:      SpO2: 96% 94% 98% 92%   No intake or output data in the 24 hours ending 12/13/15 1009  Exam: Cardiac: S1/S2, RRR, No murmurs, No gallops or rubs. Pulm: Has wheezing laterally. No rales or rubs. Abd: Soft, nondistended, mild tenderness over RLQ, no rebound pain, no organomegaly, BS present. Ext: 3+ right leg edema and 1+ edema on the right leg. 2+DP/PT pulse bilaterally. Musculoskeletal: No joint deformities, No joint redness or warmth, no limitation of ROM in spin. Skin: No rashes.  Neuro: Alert, oriented X3, cranial nerves II-XII grossly intact, moves all extremities Psych: In depressed mood, has suicidal ideation, but no homicidal ideation   Data Review   Micro Results No results found for this or any previous visit (from the past 240 hour(s)).  Radiology Reports Dg Chest 2 View  12/12/2015  CLINICAL DATA:  Cough EXAM: CHEST  2 VIEW COMPARISON:  03/07/2015 FINDINGS: Cardiomediastinal silhouette is stable. No acute infiltrate or pleural effusion. No pulmonary edema. Mild degenerative changes mid thoracic spine. Mild infrahilar bronchitic changes. IMPRESSION: No acute infiltrate or pulmonary edema. Mild infrahilar bronchitic changes. Electronically Signed   By: Lahoma Crocker M.D.   On: 12/12/2015 17:05   Ct Angio Chest Pe W/cm &/or Wo Cm  12/12/2015  CLINICAL DATA:  50 year old female with history of  COPD presenting with shortness of breath and elevated D-dimer. EXAM: CT ANGIOGRAPHY CHEST WITH CONTRAST TECHNIQUE: Multidetector CT imaging of the chest was performed using the standard protocol during bolus administration of intravenous contrast. Multiplanar CT image reconstructions and MIPs were obtained to evaluate the vascular anatomy. CONTRAST:  18mL OMNIPAQUE IOHEXOL 350 MG/ML SOLN COMPARISON:  Radiograph dated 12/12/2015 FINDINGS: There is centrilobular emphysema. A patchy area of ground-glass and nodular density noted in the right middle lobe on most compatible with pneumonia and possibly bronchopneumonia. Small right pleural effusion with associated partial compressive atelectasis of the right lung base. The central airways are patent. The thoracic aorta appears unremarkable. No CT evidence of pulmonary embolism. No cardiomegaly or pericardial effusion. No hilar or mediastinal adenopathy. The esophagus is grossly unremarkable. There is no axillary adenopathy. The chest wall soft tissues appear unremarkable. Mild degenerative changes of the spine. 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 No acute fracture. The visualized upper  abdomen appears unremarkable. Review of the MIP images confirms the above findings. IMPRESSION: No CT evidence of pulmonary embolism. Right middle lobe pneumonia Small right pleural effusion. Electronically Signed   By: Anner Crete M.D.   On: 12/12/2015 23:33     CBC  Recent Labs Lab 12/12/15 1627  WBC 10.1  HGB 10.7*  HCT 34.1*  PLT 323  MCV 91.2  MCH 28.6  MCHC 31.4  RDW 15.6*    Chemistries   Recent Labs Lab 12/12/15 1627  NA 137  K 4.0  CL 95*  CO2 30  GLUCOSE 128*  BUN 9  CREATININE 0.61  CALCIUM 9.4  AST 17  ALT 23  ALKPHOS 97  BILITOT 0.4   ------------------------------------------------------------------------------------------------------------------ estimated creatinine clearance is 86.1 mL/min (by C-G formula based  on Cr of 0.61). ------------------------------------------------------------------------------------------------------------------ No results for input(s): HGBA1C in the last 72 hours. ------------------------------------------------------------------------------------------------------------------  Recent Labs  12/13/15 0100  CHOL 177  HDL 95  LDLCALC 65  TRIG 87  CHOLHDL 1.9   ------------------------------------------------------------------------------------------------------------------ No results for input(s): TSH, T4TOTAL, T3FREE, THYROIDAB in the last 72 hours.  Invalid input(s): FREET3 ------------------------------------------------------------------------------------------------------------------ No results for input(s): VITAMINB12, FOLATE, FERRITIN, TIBC, IRON, RETICCTPCT in the last 72 hours.  Coagulation profile  Recent Labs Lab 12/13/15 0100  INR 0.96     Recent Labs  12/12/15 1640  DDIMER 2.42*    Cardiac Enzymes  Recent Labs Lab 12/12/15 1640 12/13/15 0100 12/13/15 0717  TROPONINI <0.03 <0.03 <0.03   ------------------------------------------------------------------------------------------------------------------ Invalid input(s): POCBNP   CBG: No results for input(s): GLUCAP in the last 168 hours.     Studies: Dg Chest 2 View  12/12/2015  CLINICAL DATA:  Cough EXAM: CHEST  2 VIEW COMPARISON:  03/07/2015 FINDINGS: Cardiomediastinal silhouette is stable. No acute infiltrate or pleural effusion. No pulmonary edema. Mild degenerative changes mid thoracic spine. Mild infrahilar bronchitic changes. IMPRESSION: No acute infiltrate or pulmonary edema. Mild infrahilar bronchitic changes. Electronically Signed   By: Lahoma Crocker M.D.   On: 12/12/2015 17:05   Ct Angio Chest Pe W/cm &/or Wo Cm  12/12/2015  CLINICAL DATA:  50 year old female with history of COPD presenting with shortness of breath and elevated D-dimer. EXAM: CT ANGIOGRAPHY CHEST WITH  CONTRAST TECHNIQUE: Multidetector CT imaging of the chest was performed using the standard protocol during bolus administration of intravenous contrast. Multiplanar CT image reconstructions and MIPs were obtained to evaluate the vascular anatomy. CONTRAST:  115mL OMNIPAQUE IOHEXOL 350 MG/ML SOLN COMPARISON:  Radiograph dated 12/12/2015 FINDINGS: There is centrilobular emphysema. A patchy area of ground-glass and nodular density noted in the right middle lobe on most compatible with pneumonia and possibly bronchopneumonia. Small right pleural effusion with associated partial compressive atelectasis of the right lung base. The central airways are patent. The thoracic aorta appears unremarkable. No CT evidence of pulmonary embolism. No cardiomegaly or pericardial effusion. No hilar or mediastinal adenopathy. The esophagus is grossly unremarkable. There is no axillary adenopathy. The chest wall soft tissues appear unremarkable. Mild degenerative changes of the spine. 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 No acute fracture. The visualized upper abdomen appears unremarkable. Review of the MIP images confirms the above findings. IMPRESSION: No CT evidence of pulmonary embolism. Right middle lobe pneumonia Small right pleural effusion. Electronically Signed   By: Anner Crete M.D.   On: 12/12/2015 23:33      No results found for: HGBA1C Lab Results  Component Value Date   LDLCALC 65 12/13/2015   CREATININE 0.61 12/12/2015  Scheduled Meds: . ALPRAZolam  1 mg Oral BID  . aspirin EC  81 mg Oral Daily  . dextromethorphan-guaiFENesin  1 tablet Oral BID  . enoxaparin (LOVENOX) injection  40 mg Subcutaneous Q24H  . escitalopram  20 mg Oral BID  . furosemide  40 mg Intravenous Daily  . gabapentin  800 mg Oral TID  . ibuprofen  800 mg Oral TID  . ipratropium  0.5 mg Nebulization Q6H  . levalbuterol  1.25 mg Nebulization Q6H  . lisinopril  2.5 mg Oral Daily  . loratadine  10 mg  Oral q morning - 10a  . methocarbamol  1,500 mg Oral TID  . methylPREDNISolone (SOLU-MEDROL) injection  60 mg Intravenous Q12H  . metoprolol tartrate  25 mg Oral BID  . nicotine  14 mg Transdermal Once  . pantoprazole  40 mg Oral Daily  . QUEtiapine  50 mg Oral QHS  . rosuvastatin  10 mg Oral Daily  . sodium chloride  3 mL Intravenous Q12H  . [START ON 12/15/2015] Vitamin D (Ergocalciferol)  50,000 Units Oral Weekly   Continuous Infusions: . sodium chloride    . piperacillin-tazobactam (ZOSYN)  IV 3.375 g (12/13/15 0707)  . vancomycin      Principal Problem:   Acute on chronic respiratory failure with hypoxia (HCC) Active Problems:   Chronic low back pain   COPD exacerbation (HCC)   Hypertension   Arthritis   Bipolar 1 disorder (HCC)   Depression   Leg edema   Acute CHF (congestive heart failure) (HCC)   IBS (irritable bowel syndrome)   Abdominal pain   Suicidal ideation   CAP (community acquired pneumonia)   SOB (shortness of breath)   Dysuria    Time spent: 45 minutes   [facility] Pager [phone]. If 7PM-7AM, please contact night-coverage at [email or web address], password [facility] 12/13/2015, 10:09 AM  LOS: 0 days

## 2015-12-13 NOTE — ED Notes (Signed)
Pt sleeping soundly when this RN went in to give PRN Xanax and Oxycodone.  Will hold medications.

## 2015-12-13 NOTE — Progress Notes (Signed)
ANTIBIOTIC CONSULT NOTE - INITIAL  Pharmacy Consult for Vancomycin & Zosyn Indication: HCAP  Allergies  Allergen Reactions  . Cymbalta [Duloxetine Hcl] Other (See Comments)    Mood changes...mean  . Paxil [Paroxetine Hcl] Other (See Comments)    Mean and aggresive  . Wellbutrin [Bupropion] Other (See Comments)    Rapid heartrate  . Zoloft [Sertraline] Other (See Comments)    Anger.    Patient Measurements: Height: 5\' 3"  (160 cm) Weight: 184 lb (83.462 kg) IBW/kg (Calculated) : 52.4  Vital Signs: Temp: 97.9 F (36.6 C) (01/18 1605) Temp Source: Oral (01/18 1605) BP: 147/135 mmHg (01/19 0300) Pulse Rate: 95 (01/19 0315) Intake/Output from previous day:   Intake/Output from this shift:    Labs:  Recent Labs  12/12/15 1627  WBC 10.1  HGB 10.7*  PLT 323  CREATININE 0.61   Estimated Creatinine Clearance: 86.1 mL/min (by C-G formula based on Cr of 0.61). No results for input(s): VANCOTROUGH, VANCOPEAK, VANCORANDOM, GENTTROUGH, GENTPEAK, GENTRANDOM, TOBRATROUGH, TOBRAPEAK, TOBRARND, AMIKACINPEAK, AMIKACINTROU, AMIKACIN in the last 72 hours.   Microbiology: No results found for this or any previous visit (from the past 720 hour(s)).  Medical History: Past Medical History  Diagnosis Date  . Asthma   . COPD (chronic obstructive pulmonary disease) ([place])   . Back pain   . Scoliosis   . Hypertension   . Arthritis   . Bipolar 1 disorder ([place])   . Depression   . Fibroid   . Migraine   . Chronic low back pain 03/06/2014  . IBS (irritable bowel syndrome)     Medications:  Scheduled:  . ALPRAZolam  1 mg Oral BID  . aspirin EC  81 mg Oral Daily  . dextromethorphan-guaiFENesin  1 tablet Oral BID  . enoxaparin (LOVENOX) injection  40 mg Subcutaneous Q24H  . escitalopram  20 mg Oral BID  . furosemide  40 mg Intravenous Daily  . gabapentin  800 mg Oral TID  . ibuprofen  800 mg Oral TID  . ipratropium  0.5 mg Nebulization Q6H  . levalbuterol  1.25 mg Nebulization  Q6H  . lisinopril  2.5 mg Oral Daily  . loratadine  10 mg Oral q morning - 10a  . methocarbamol  1,500 mg Oral TID  . methylPREDNISolone (SOLU-MEDROL) injection  60 mg Intravenous Q12H  . metoprolol tartrate  25 mg Oral BID  . nicotine  14 mg Transdermal Once  . pantoprazole  40 mg Oral Daily  . QUEtiapine  50 mg Oral QHS  . rosuvastatin  10 mg Oral Daily  . sodium chloride  3 mL Intravenous Q12H  . [START ON 12/15/2015] Vitamin D (Ergocalciferol)  50,000 Units Oral Weekly   Infusions:  . sodium chloride     Assessment:  50 yr female with c/o cough, SOB, abdominal pain, dysuria, diarrhea, suicidal ideation & bilateral leg edema  Patient was recently diagnosed with pneumonia and on oral Levaquin PTA.  Dopplers = no evidence of DVT   CTAngio shows right middle lobe pneumonia  Pharmacy consulted to dose Vancomycin & Zosyn   1/19 >>Vanc >> 1/19 >>Zosyn >>    1/19 blood: 1/19 sputum:   Trough/Dose change info:   Goal of Therapy:  Vancomycin trough level 15-20 mcg/ml  Plan:  Measure antibiotic drug levels at steady state Follow up culture results   Vancomycin 1gm IV q8h  Zosyn 3.375gm IV over 85min x 1 in ED followed by 3.375gm IV q8h (each dose infused over 4 hrs)  Glen Kesinger, Marylin Crosby  Pasty Arch, PharmD 12/13/2015,4:04 AM

## 2015-12-14 DIAGNOSIS — J189 Pneumonia, unspecified organism: Secondary | ICD-10-CM

## 2015-12-14 LAB — EXPECTORATED SPUTUM ASSESSMENT W GRAM STAIN, RFLX TO RESP C

## 2015-12-14 LAB — COMPREHENSIVE METABOLIC PANEL
ALT: 16 U/L (ref 14–54)
ANION GAP: 9 (ref 5–15)
AST: 11 U/L — ABNORMAL LOW (ref 15–41)
Albumin: 3.3 g/dL — ABNORMAL LOW (ref 3.5–5.0)
Alkaline Phosphatase: 84 U/L (ref 38–126)
BUN: 19 mg/dL (ref 6–20)
CHLORIDE: 94 mmol/L — AB (ref 101–111)
CO2: 40 mmol/L — AB (ref 22–32)
CREATININE: 0.82 mg/dL (ref 0.44–1.00)
Calcium: 9.5 mg/dL (ref 8.9–10.3)
Glucose, Bld: 131 mg/dL — ABNORMAL HIGH (ref 65–99)
POTASSIUM: 4.1 mmol/L (ref 3.5–5.1)
SODIUM: 143 mmol/L (ref 135–145)
Total Bilirubin: 0.3 mg/dL (ref 0.3–1.2)
Total Protein: 5.8 g/dL — ABNORMAL LOW (ref 6.5–8.1)

## 2015-12-14 LAB — LEGIONELLA ANTIGEN, URINE

## 2015-12-14 LAB — CBC
HEMATOCRIT: 35.1 % — AB (ref 36.0–46.0)
HEMOGLOBIN: 10.5 g/dL — AB (ref 12.0–15.0)
MCH: 28.8 pg (ref 26.0–34.0)
MCHC: 29.9 g/dL — ABNORMAL LOW (ref 30.0–36.0)
MCV: 96.2 fL (ref 78.0–100.0)
PLATELETS: 311 10*3/uL (ref 150–400)
RBC: 3.65 MIL/uL — AB (ref 3.87–5.11)
RDW: 16.1 % — ABNORMAL HIGH (ref 11.5–15.5)
WBC: 15.7 10*3/uL — AB (ref 4.0–10.5)

## 2015-12-14 LAB — HEMOGLOBIN A1C
Hgb A1c MFr Bld: 6.2 % — ABNORMAL HIGH (ref 4.8–5.6)
MEAN PLASMA GLUCOSE: 131 mg/dL

## 2015-12-14 LAB — EXPECTORATED SPUTUM ASSESSMENT W REFEX TO RESP CULTURE

## 2015-12-14 MED ORDER — METOPROLOL TARTRATE 50 MG PO TABS
50.0000 mg | ORAL_TABLET | Freq: Two times a day (BID) | ORAL | Status: DC
  Administered 2015-12-14 – 2015-12-17 (×6): 50 mg via ORAL
  Filled 2015-12-14 (×6): qty 1

## 2015-12-14 MED ORDER — NICOTINE 21 MG/24HR TD PT24
21.0000 mg | MEDICATED_PATCH | Freq: Every day | TRANSDERMAL | Status: DC
  Administered 2015-12-14 – 2015-12-17 (×4): 21 mg via TRANSDERMAL
  Filled 2015-12-14 (×4): qty 1

## 2015-12-14 MED ORDER — LEVALBUTEROL HCL 0.63 MG/3ML IN NEBU
0.6300 mg | INHALATION_SOLUTION | RESPIRATORY_TRACT | Status: DC | PRN
  Administered 2015-12-14 (×3): 0.63 mg via RESPIRATORY_TRACT
  Filled 2015-12-14 (×2): qty 3

## 2015-12-14 MED ORDER — LEVOFLOXACIN IN D5W 500 MG/100ML IV SOLN
500.0000 mg | INTRAVENOUS | Status: DC
  Administered 2015-12-14 – 2015-12-17 (×4): 500 mg via INTRAVENOUS
  Filled 2015-12-14 (×4): qty 100

## 2015-12-14 MED ORDER — LEVALBUTEROL HCL 1.25 MG/0.5ML IN NEBU
1.2500 mg | INHALATION_SOLUTION | RESPIRATORY_TRACT | Status: DC
  Administered 2015-12-14 – 2015-12-15 (×5): 1.25 mg via RESPIRATORY_TRACT
  Filled 2015-12-14 (×12): qty 0.5

## 2015-12-14 NOTE — Progress Notes (Signed)
Pt refusing to wear non-skid socks and for bed alarm to be set, attempted to educate pt on safety but pt was not interested and was cussing about wanting additional pain medications.

## 2015-12-14 NOTE — Progress Notes (Signed)
PT Cancellation Note  Patient Details Name: [name] MRN: [id number] DOB: 1966-06-06   Cancelled Treatment:    Reason Eval/Treat Not Completed: Pain limiting ability to participate RN in room and reports pt received pain meds an hour ago.  Pt adamantly refuses PT today.   Illiana Losurdo,KATHrine E 12/14/2015, 9:38 AM Carmelia Bake, PT, DPT 12/14/2015 Pager: [phone]

## 2015-12-14 NOTE — Progress Notes (Addendum)
[facility] PROGRESS NOTE    Progress Note   "[name] MRN:[id number] DOB: 06/24/1966 DOA: 12/12/2015 PCP: ARVIND,MOOGALI M, MD   Brief Narrative:   [name] is an 50 y.o. female has no history of depression and bipolar disorder who presents to the ED with cough shortness of breath suicidal ideation and abdominal pain, and he was found to have a BNP of 571 troponins were negative d-dimer was 2.4 UDS was positive for benzos CT image of the chest show no PE but showed right lower lobe pneumonia lower extremity Doppler was negative for DVT.  Assessment/Plan:   Acute on chronic respiratory failure with hypoxia (HCC): Multifactorial due to pneumonia  and possibly COPD. Continue nebulizers, IVs steroids and antibiotics. Urine Legionella and pneumococcal antigen are negative. She has remained afebrile, leukocytosis has worsened due to steroids. I will stop IV Lasix her chloride is getting worst her potassium is rising and there was a mild rising creatinine. She has unfortunately edema or JVD. She continues to have wheezing bilaterally. Review basic metabolic panel.  Suicidal ideation/depression: Patient was evaluated by psychiatrist and recommended to adjust Seroquel and trazodone continue Neurontin and Xanax. He recommended outpatient psychiatric follow-up.  IBS: C. difficile pending continue Zofran for nausea.  Chronic low back pain: I'm not increasing her narcotics will continue current narcotic regimen.  Essential  Hypertension: Hold Lasix continue metoprolol and lisinopril.     DVT Prophylaxis - Lovenox ordered.  Family Communication: none Disposition Plan: Home 3-4 days Code Status:     Code Status Orders        Start     Ordered   12/13/15 0047  Full code   Continuous     01" /19/17 0049    Code Status History    Date Active Date Inactive Code Status Order ID Comments User Context   This patient has a current code status but no historical code  status.        IV Access:    Peripheral IV   Procedures and diagnostic studies:   Dg Chest 2 View  12/12/2015  CLINICAL DATA:  Cough EXAM: CHEST  2 VIEW COMPARISON:  03/07/2015 FINDINGS: Cardiomediastinal silhouette is stable. No acute infiltrate or pleural effusion. No pulmonary edema. Mild degenerative changes mid thoracic spine. Mild infrahilar bronchitic changes. IMPRESSION: No acute infiltrate or pulmonary edema. Mild infrahilar bronchitic changes. Electronically Signed   By: Lahoma Crocker M.D.   On: 12/12/2015 17:05   Ct Angio Chest Pe W/cm &/or Wo Cm  12/12/2015  CLINICAL DATA:  50 year old female with history of COPD presenting with shortness of breath and elevated D-dimer. EXAM: CT ANGIOGRAPHY CHEST WITH CONTRAST TECHNIQUE: Multidetector CT imaging of the chest was performed using the standard protocol during bolus administration of intravenous contrast. Multiplanar CT image reconstructions and MIPs were obtained to evaluate the vascular anatomy. CONTRAST:  126mL OMNIPAQUE IOHEXOL 350 MG/ML SOLN COMPARISON:  Radiograph dated 12/12/2015 FINDINGS: There is centrilobular emphysema. A patchy area of ground-glass and nodular density noted in the right middle lobe on most compatible with pneumonia and possibly bronchopneumonia. Small right pleural effusion with associated partial compressive atelectasis of the right lung base. The central airways are patent. The thoracic aorta appears unremarkable. No CT evidence of pulmonary embolism. No cardiomegaly or pericardial effusion. No hilar or mediastinal adenopathy. The esophagus is grossly unremarkable. There is no axillary adenopathy. The chest wall soft tissues appear unremarkable. Mild degenerative changes of the spine. 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 No acute fracture. The visualized upper abdomen appears unremarkable. Review of the MIP images confirms the above findings. IMPRESSION: No CT evidence of pulmonary embolism.  Right middle lobe pneumonia Small right pleural effusion. Electronically Signed   By: Anner Crete M.D.   On: 12/12/2015 23:33     Medical Consultants:    None.  Anti-Infectives:   Anti-infectives    Start     Dose/Rate Route Frequency Ordered Stop   12/13/15 0900  vancomycin (VANCOCIN) IVPB 1000 mg/200 mL premix     1,000 mg 200 mL/hr over 60 Minutes Intravenous Every 8 hours 12/13/15 0412     12/13/15 0700  piperacillin-tazobactam (ZOSYN) IVPB 3.375 g     3.375 g 12.5 mL/hr over 240 Minutes Intravenous Every 8 hours 12/13/15 0412     12/12/15 2345  piperacillin-tazobactam (ZOSYN) IVPB 3.375 g     3.375 g 100 mL/hr over 30 Minutes Intravenous STAT 12/12/15 2341 12/13/15 0148   12/12/15 2345  vancomycin (VANCOCIN) IVPB 1000 mg/200 mL premix     1,000 mg 200 mL/hr over 60 Minutes Intravenous STAT 12/12/15 2341 12/13/15 0148      Subjective:    Jaquanda S Leugers she is complaining that her back hurts once her Xanax Robaxin and narcotics increased.  Objective:    Filed Vitals:   12/14/15 0424 12/14/15 0456 12/14/15 0917 12/14/15 0944  BP:  145/86  168/87  Pulse:  79  106  Temp:  98 F (36.7 C)    TempSrc:  Oral    Resp:  16  18  Height:      Weight:  83.6 kg (184 lb 4.9 oz)    SpO2: 100% 97% 96% 88%   No intake or output data in the 24 hours ending 12/14/15 1225 Filed Weights   12/13/15 0402 12/14/15 0456  Weight: 83.462 kg (184 lb) 83.6 kg (184 lb 4.9 oz)    Exam: Gen:  NAD Cardiovascular:  RRR, No murmurs Chest and lungs:  Good air movement with wheezing bilaterally. Abdomen:  Abdomen soft, NT/ND, + BS Extremities:  No C/E/C   Data Reviewed:    Labs: Basic Metabolic Panel:  Recent Labs Lab 12/12/15 1627 12/14/15 0345  NA 137 143  K 4.0 4.1  CL 95* 94*  CO2 30 40*  GLUCOSE 128* 131*  BUN 9 19  CREATININE 0.61 0.82  CALCIUM 9.4 9.5   GFR Estimated Creatinine Clearance: 84.1 mL/min (by C-G formula based on Cr of 0.82). Liver Function  Tests:  Recent Labs Lab 12/12/15 1627 12/14/15 0345  AST 17 11*  ALT 23 16  ALKPHOS 97 84  BILITOT 0.4 0.3  PROT 6.6 5.8*  ALBUMIN 3.9 3.3*    Recent Labs Lab 12/12/15 1714  LIPASE 12   No results for input(s): AMMONIA in the last 168 hours. Coagulation profile  Recent Labs Lab 12/13/15 0100  INR 0.96    CBC:  Recent Labs Lab 12/12/15 1627 12/14/15 0345  WBC 10.1 15.7*  HGB 10.7* 10.5*  HCT 34.1* 35.1*  MCV 91.2 96.2  PLT 323 311   Cardiac Enzymes:  Recent Labs Lab 12/12/15 1640 12/13/15 0100 12/13/15 0717 12/13/15 1306  TROPONINI <0.03 <0.03 <0.03 <0.03   BNP (last 3 results) No results for input(s): PROBNP in the last 8760 hours. CBG: No results for input(s): GLUCAP in the last 168 hours. D-Dimer:  Recent Labs  12/12/15 1640  DDIMER 2.42*   Hgb A1c:  Recent Labs  12/13/15 0100  HGBA1C 6.2*  Lipid Profile:  Recent Labs  12/13/15 0100  CHOL 177  HDL 95  LDLCALC 65  TRIG 87  CHOLHDL 1.9   Thyroid function studies: No results for input(s): TSH, T4TOTAL, T3FREE, THYROIDAB in the last 72 hours.  Invalid input(s): FREET3 Anemia work up: No results for input(s): VITAMINB12, FOLATE, FERRITIN, TIBC, IRON, RETICCTPCT in the last 72 hours. Sepsis Labs:  Recent Labs Lab 12/12/15 1627 12/13/15 0526 12/13/15 0527 12/13/15 0717 12/14/15 0345  PROCALCITON  --  <0.10  --   --   --   WBC 10.1  --   --   --  15.7*  LATICACIDVEN  --   --  1.8 1.2  --    Microbiology Recent Results (from the past 240 hour(s))  Culture, expectorated sputum-assessment     Status: None   Collection Time: 12/14/15  4:17 AM  Result Value Ref Range Status   Specimen Description SPUTUM  Final   Special Requests NONE  Final   Sputum evaluation   Final    THIS SPECIMEN IS ACCEPTABLE. RESPIRATORY CULTURE REPORT TO FOLLOW.   Report Status 12/14/2015 FINAL  Final     Medications:   . aspirin EC  81 mg Oral Daily  . dextromethorphan-guaiFENesin  1  tablet Oral BID  . enoxaparin (LOVENOX) injection  40 mg Subcutaneous Q24H  . escitalopram  20 mg Oral BID  . furosemide  40 mg Intravenous Daily  . gabapentin  800 mg Oral TID  . ipratropium  0.5 mg Nebulization Q6H  . levalbuterol  1.25 mg Nebulization Q6H  . lisinopril  2.5 mg Oral Daily  . loratadine  10 mg Oral q morning - 10a  . methocarbamol  1,500 mg Oral TID  . methylPREDNISolone (SOLU-MEDROL) injection  60 mg Intravenous Q12H  . metoprolol tartrate  25 mg Oral BID  . pantoprazole  40 mg Oral Daily  . piperacillin-tazobactam (ZOSYN)  IV  3.375 g Intravenous Q8H  . QUEtiapine  100 mg Oral QHS  . rosuvastatin  10 mg Oral Daily  . sodium chloride  3 mL Intravenous Q12H  . vancomycin  1,000 mg Intravenous Q8H  . [START ON 12/15/2015] Vitamin D (Ergocalciferol)  50,000 Units Oral Weekly   Continuous Infusions:   Time spent: 25 min   LOS: 1 day   Charlynne Cousins  [facility] Pager [phone]  *Please refer to [email or web address], password [email or web address] to get updated schedule on who will round on this patient, as hospitalists switch teams weekly. If 7PM-7AM, please contact night-coverage at [email or web address], password [email or web address] for any overnight needs.  12/14/2015, 12:25 PM

## 2015-12-14 NOTE — Evaluation (Signed)
Occupational Therapy Evaluation and Discharge Summary Patient Details Name: [name] MRN: [id number] DOB: [birth year] Today's Date: 12/14/2015    History of Present Illness Pt is a 50 yo female admitted with SOB, cough and suicidal ideation.  Pt with B leg edema, diarrhea, abdominal pain.  Pt with COPD exacerbation on top of treating pneumonia for last week.     Clinical Impression   Pt admitted with the above diagnosis and overall is at baseline for adls although limited in energy and overall not feeling well.  Pt was safe dressing, bathing and toileting.  Pt states she is independent with her adls and does not want or need OT at this time. Feel pt does need recommended OP Psych therapy as she talks about not having the desire to do much anymore including her self care.  Will sign off of acute OT as pt is independent with basic adls.    Follow Up Recommendations  No OT follow up;Supervision/Assistance - 24 hour (24 hours most for phychological state)    Equipment Recommendations  3 in 1 bedside comode    Recommendations for Other Services       Precautions / Restrictions Precautions Precautions: None Restrictions Weight Bearing Restrictions: No      Mobility Bed Mobility Overal bed mobility: Independent             General bed mobility comments: No assist needed.  Transfers Overall transfer level: Modified independent Equipment used: None             General transfer comment: used IV pole    Balance Overall balance assessment: No apparent balance deficits (not formally assessed)                                          ADL Overall ADL's : At baseline                                       General ADL Comments: Pt appears to be at baseline with what little she agreed to do b/c of fatigue and pain.  Pt can donn socks and shoes, toilet Ily, dress Ily and mobilized minimally in room with S holding to IV pole.  Pt is not  interested in [facility] and most likey does not need it.  Agree with OP Psych follow up as she states she just "does not feel up to bathing/dressing and caring for herself on some days:     Vision     Perception     Praxis      Pertinent Vitals/Pain Pain Assessment: 0-10 Pain Score: 8  Pain Location: ribs, back, abdomen Pain Descriptors / Indicators: Aching;Burning;Sharp Pain Intervention(s): Monitored during session;Limited activity within patient's tolerance;Repositioned;Premedicated before session     Hand Dominance Right   Extremity/Trunk Assessment Upper Extremity Assessment Upper Extremity Assessment: Overall WFL for tasks assessed   Lower Extremity Assessment Lower Extremity Assessment: Defer to PT evaluation   Cervical / Trunk Assessment Cervical / Trunk Assessment: Normal   Communication Communication Communication: No difficulties   Cognition Arousal/Alertness: Awake/alert Behavior During Therapy: Anxious Overall Cognitive Status: Within Functional Limits for tasks assessed                     General Comments  Exercises       Shoulder Instructions      Home Living Family/patient expects to be discharged to:: Private residence Living Arrangements: Alone Available Help at Discharge: Friend(s);Available PRN/intermittently Type of Home: House Home Access: Stairs to enter [employer] of Steps: 3   Home Layout: One level     Bathroom Shower/Tub: Tub/shower unit;Curtain Shower/tub characteristics: Architectural technologist: Standard     Home Equipment: None          Prior Functioning/Environment Level of Independence: Independent        Comments: Pt states she can bathe and dress, drive etc but doesnt always feel like doing it.    OT Diagnosis:     OT Problem List:     OT Treatment/Interventions:      OT Goals(Current goals can be found in the care plan section) Acute Rehab OT Goals Patient Stated Goal: to feel  better all over. OT Goal Formulation: All assessment and education complete, DC therapy  OT Frequency:     Barriers to D/C:            Co-evaluation              End of Session Equipment Utilized During Treatment: Oxygen Nurse Communication: Mobility status  Activity Tolerance: Patient limited by pain Patient left: in bed;with call bell/phone within reach   Time: 1158-1210 OT Time Calculation (min): 12 min Charges:  OT General Charges $OT Visit: 1 Procedure OT Evaluation $OT Eval Low Complexity: 1 Procedure G-Codes:    Glenford Peers 01/09/2016, 12:19 PM  [phone]

## 2015-12-14 NOTE — Clinical Documentation Improvement (Signed)
Internal Medicine  Can the diagnosis of CHF be further specified?    Acuity - Acute, Chronic, Acute on Chronic   Type - Systolic, Diastolic, Systolic and Diastolic  Other  Clinically Undetermined   Document any associated diagnoses/conditions   Supporting Information: H&P: documented active problem: Acute CHF (congestive heart failure) will treat possible CHF  Indicators> bilateral leg edema 3+ right leg, BNP 571, admit to tele bed,  Treatment: Lasix 40 mg daily by IV -trop x 3 -2d echo>EF 55-60% Study Conclusions  Left ventricle: The cavity size was normal. Wall thickness was increased in a pattern of mild LVH. Systolic function was normal. The estimated ejection fraction was in the range of 55% to 60%. Wall motion was normal; there were no regional wall motion abnormalities. Doppler parameters are consistent with abnormal left ventricular relaxation (grade 1 diastolic dysfunction).  Impressions: - Normal LV systolic function; grade 1 diastolic dysfunction; trace MR and TR.  continue home metoprolol -start lisinopril 2.5 mg daily and ASA -Daily weights -strict I/O's -Low salt diet  Please update your documentation within the medical record to reflect your response to this query. Thank you Please exercise your independent, professional judgment when responding. A specific answer is not anticipated or expected.   Thank You,  [place] [phone]

## 2015-12-15 DIAGNOSIS — F319 Bipolar disorder, unspecified: Secondary | ICD-10-CM

## 2015-12-15 LAB — BASIC METABOLIC PANEL
Anion gap: 10 (ref 5–15)
BUN: 15 mg/dL (ref 6–20)
CALCIUM: 9.4 mg/dL (ref 8.9–10.3)
CO2: 38 mmol/L — AB (ref 22–32)
CREATININE: 0.63 mg/dL (ref 0.44–1.00)
Chloride: 94 mmol/L — ABNORMAL LOW (ref 101–111)
GFR calc non Af Amer: >60 mL/min (ref >60)
GLUCOSE: 139 mg/dL — AB (ref 65–99)
Potassium: 3.8 mmol/L (ref 3.5–5.1)
Sodium: 142 mmol/L (ref 135–145)

## 2015-12-15 MED ORDER — TRAMADOL HCL 50 MG PO TABS
100.0000 mg | ORAL_TABLET | Freq: Four times a day (QID) | ORAL | Status: DC | PRN
  Administered 2015-12-15 – 2015-12-16 (×4): 100 mg via ORAL
  Filled 2015-12-15 (×4): qty 2

## 2015-12-15 MED ORDER — KETOROLAC TROMETHAMINE 30 MG/ML IJ SOLN
30.0000 mg | Freq: Four times a day (QID) | INTRAMUSCULAR | Status: DC | PRN
  Administered 2015-12-15 – 2015-12-17 (×5): 30 mg via INTRAVENOUS
  Filled 2015-12-15 (×5): qty 1

## 2015-12-15 MED ORDER — MAGIC MOUTHWASH
5.0000 mL | Freq: Three times a day (TID) | ORAL | Status: DC
  Administered 2015-12-15 – 2015-12-17 (×6): 5 mL via ORAL
  Filled 2015-12-15 (×8): qty 5

## 2015-12-15 MED ORDER — IPRATROPIUM-ALBUTEROL 0.5-2.5 (3) MG/3ML IN SOLN
3.0000 mL | RESPIRATORY_TRACT | Status: DC
Start: 2015-12-15 — End: 2015-12-17
  Administered 2015-12-15 – 2015-12-17 (×10): 3 mL via RESPIRATORY_TRACT
  Filled 2015-12-15 (×10): qty 3

## 2015-12-15 MED ORDER — IPRATROPIUM BROMIDE 0.02 % IN SOLN
0.5000 mg | RESPIRATORY_TRACT | Status: DC
  Administered 2015-12-15 (×2): 0.5 mg via RESPIRATORY_TRACT
  Filled 2015-12-15 (×2): qty 2.5

## 2015-12-15 NOTE — Evaluation (Signed)
Physical Therapy Evaluation Patient Details Name: [name] MRN: [id number] DOB: 07-31-1966 Today's Date: 12/15/2015   History of Present Illness  Pt is a 50 yo female admitted with SOB, cough and suicidal ideation.  Pt with B leg edema, diarrhea, abdominal pain.  Pt with COPD exacerbation on top of treating pneumonia for last week.  No LE edema noted during session today.   Clinical Impression  Pt with generalized weakness and mostly compromised with respiratory issues. Found pt in bed without her O2 on (stated it dried out her nose) , at this time O2 was 89-91% . Educated with deep breathing exercises and oxygen does recover. Then pt went to BR without O2 on RA and returned reporting feeling dizzy. O2 sats then at 76%. Placed back on 2L O2 and deep breathing exercises backup to 90% in 1 minute. Ambulated on 2 L02 and pt became very fatigued after about 100 feet, and weak LEs. O2 was at 88% , and again exercises to bring it back up to 91%. Audible wheezing. Recommend staff walk with pt (short walks), and breathing exercises. Pt to benefit from continued PTto help with breathing exercises, LE exercises, energy conservation, and ambulation.      Follow Up Recommendations Home health PT    Equipment Recommendations  Other (comment) (oxygen)    Recommendations for Other Services       Precautions / Restrictions Precautions Precautions: None Restrictions Weight Bearing Restrictions: No      Mobility  Bed Mobility Overal bed mobility: Independent                Transfers Overall transfer level: Modified independent Equipment used: None             General transfer comment: used IV pole  Ambulation/Gait Ambulation/Gait assistance: Min guard Ambulation Distance (Feet): 100 Feet Assistive device: 1 person hand held assist Gait Pattern/deviations: Step-through pattern     General Gait Details: slow steady, however pt stated she felt weak in her legs half way through  and gait slowed downa  bit. 2 L 02 with [place] however pt down to 88% with audible wheezing after session. back up to 91% after seated with instructed breathing exercises .   Stairs            Wheelchair Mobility    Modified Rankin (Stroke Patients Only)       Balance Overall balance assessment: No apparent balance deficits (not formally assessed) (turned and donned swertapants in sitting without difficulties. More dizzy reported with decrease in O2 sats. )                                           Pertinent Vitals/Pain Pain Assessment: No/denies pain Pain Score: 5  Pain Location: stated her R mid quadrant abdoment hurts, esepcially when she is going to bathroom, also states she has rib pain when she coughs and back pain constant. However moved in all different directions for bed mobility and donning sweatpants and socks Independently.  Pain Descriptors / Indicators: Aching Pain Intervention(s): Monitored during session;Premedicated before session    Home Living Family/patient expects to be discharged to:: Private residence Living Arrangements: Alone Available Help at Discharge: Friend(s);Available PRN/intermittently Type of Home: House Home Access: Stairs to enter   Entrance Stairs-Number of Steps: 3 Home Layout: One level Home Equipment: None Additional Comments: Pt states she lives with her  boyfriend , but she does not want to return there. Hoping to speak with the the SW.     Prior Function Level of Independence: Independent               Hand Dominance   Dominant Hand: Right    Extremity/Trunk Assessment               Lower Extremity Assessment: Generalized weakness         Communication   Communication: No difficulties  Cognition Arousal/Alertness: Awake/alert Behavior During Therapy: WFL for tasks assessed/performed Overall Cognitive Status: Within Functional Limits for tasks assessed                      General  Comments      Exercises        Assessment/Plan    PT Assessment Patient needs continued PT services  PT Diagnosis Generalized weakness   PT Problem List Decreased strength;Decreased activity tolerance;Decreased mobility  PT Treatment Interventions Functional mobility training;Therapeutic exercise;Therapeutic activities;Patient/family education   PT Goals (Current goals can be found in the Care Plan section) Acute Rehab PT Goals Patient Stated Goal: thank you fro coming... no goal stated...  PT Goal Formulation: With patient Time For Goal Achievement: 12/29/15 Potential to Achieve Goals: Good    Frequency Min 3X/week   Barriers to discharge Other (comment) pt states she does not want to go backa dn live with her boyfriend    Co-evaluation               End of Session   Activity Tolerance: Patient tolerated treatment well Patient left: in bed Nurse Communication: Mobility status         Time: [id number] PT Time Calculation (min) (ACUTE ONLY): 28 min   Charges:   PT Evaluation $PT Eval Low Complexity: 1 Procedure PT Treatments $Gait Training: 8-22 mins   PT G CodesClide Dales 12/25/2015, 12:47 PM Clide Dales, PT Pager: [phone] 12/25/15

## 2015-12-15 NOTE — Progress Notes (Addendum)
[facility] PROGRESS NOTE    Progress Note   "[name] MRN:[id number] DOB: 11/19/1966 DOA: 12/12/2015 PCP: ARVIND,MOOGALI M, MD   Brief Narrative:   [name] is an 49 y.o. female has no history of depression and bipolar disorder who presents to the ED with cough shortness of breath suicidal ideation and abdominal pain, and he was found to have a BNP of 571 troponins were negative d-dimer was 2.4 UDS was positive for benzos CT image of the chest show no PE but showed right lower lobe pneumonia lower extremity Doppler was negative for DVT.  Assessment/Plan:   Acute on chronic respiratory failure with hypoxia (HCC): Multifactorial due to pneumonia  and possibly COPD. she continues to wheeze bilaterally not moving good air she relates her shortness of breath has not improved. Continue IV Levaquin. Continue nebulizers, IVs steroids and antibiotics.  Suicidal ideation/depression: Patient was evaluated by psychiatrist and recommended to adjust Seroquel and trazodone continue Neurontin and Xanax. He recommended outpatient psychiatric follow-up.  IBS: C. difficile pending continue Zofran for nausea.  Chronic low back pain: I'm not increasing her narcotics will continue current narcotic regimen. She relates her pain is now fully controlled at ketorolac.  Essential  Hypertension: Hold Lasix continue metoprolol and lisinopril.     DVT Prophylaxis - Lovenox ordered.  Family Communication: none Disposition Plan: Home 2-3 days Code Status:     Code Status Orders        Start     Ordered   12/13/15 0047  Full code   Continuous     12/13/15 0049    Code Status History    Date Active Date Inactive Code Status Order ID Comments User Context   This patient has a current code status but no historical code status.        IV Access:    Peripheral IV   Procedures and diagnostic studies:   No results found.   Medical Consultants:     None.  Anti-Infectives:   Anti-infectives    Start     Dose/Rate Route Frequency Ordered Stop   12/14/15 1600  levofloxacin (LEVAQUIN) IVPB 500 mg     500 mg 100 mL/hr over 60 Minutes Intravenous Every 24 hours 12/14/15 1250     01" /19/17 0900  vancomycin (VANCOCIN) IVPB 1000 mg/200 mL premix  Status:  Discontinued     1,000 mg 200 mL/hr over 60 Minutes Intravenous Every 8 hours 12/13/15 0412 12/14/15 1250   12/13/15 0700  piperacillin-tazobactam (ZOSYN) IVPB 3.375 g  Status:  Discontinued     3.375 g 12.5 mL/hr over 240 Minutes Intravenous Every 8 hours 12/13/15 0412 12/14/15 1250   12/12/15 2345  piperacillin-tazobactam (ZOSYN) IVPB 3.375 g     3.375 g 100 mL/hr over 30 Minutes Intravenous STAT 12/12/15 2341 12/13/15 0148   12/12/15 2345  vancomycin (VANCOCIN) IVPB 1000 mg/200 mL premix     1,000 mg 200 mL/hr over 60 Minutes Intravenous STAT 12/12/15 2341 12/13/15 0148      Subjective:    [name] she continues to have back pain slightly improved compared to yesterday.  Objective:    Filed Vitals:   12/15/15 0255 12/15/15 0503 12/15/15 0822 12/15/15 1055  BP:  149/85  186/98  Pulse:  81  80  Temp:  98.3 F (36.8 C)    TempSrc:  Oral    Resp:  20    Height:      Weight:  83.598 kg (184 lb 4.8 oz)  SpO2: 98% 95% 92%     Intake/Output Summary (Last 24 hours) at 12/15/15 1233 Last data filed at 12/14/15 1754  Gross per 24 hour  Intake    100 ml  Output      0 ml  Net    100 ml   Filed Weights   12/13/15 0402 12/14/15 0456 12/15/15 0503  Weight: 83.462 kg (184 lb) 83.6 kg (184 lb 4.9 oz) 83.598 kg (184 lb 4.8 oz)    Exam: Gen:  NAD Cardiovascular:  RRR, No murmurs Chest and lungs:  Good air movement with wheezing bilaterally. Abdomen:  Abdomen soft, NT/ND, + BS Extremities:  No C/E/C   Data Reviewed:    Labs: Basic Metabolic Panel:  Recent Labs Lab 12/12/15 1627 12/14/15 0345  NA 137 143  K 4.0 4.1  CL 95* 94*  CO2 30 40*   GLUCOSE 128* 131*  BUN 9 19  CREATININE 0.61 0.82  CALCIUM 9.4 9.5   GFR Estimated Creatinine Clearance: 84.1 mL/min (by C-G formula based on Cr of 0.82). Liver Function Tests:  Recent Labs Lab 12/12/15 1627 12/14/15 0345  AST 17 11*  ALT 23 16  ALKPHOS 97 84  BILITOT 0.4 0.3  PROT 6.6 5.8*  ALBUMIN 3.9 3.3*    Recent Labs Lab 12/12/15 1714  LIPASE 12   No results for input(s): AMMONIA in the last 168 hours. Coagulation profile  Recent Labs Lab 12/13/15 0100  INR 0.96    CBC:  Recent Labs Lab 12/12/15 1627 12/14/15 0345  WBC 10.1 15.7*  HGB 10.7* 10.5*  HCT 34.1* 35.1*  MCV 91.2 96.2  PLT 323 311   Cardiac Enzymes:  Recent Labs Lab 12/12/15 1640 12/13/15 0100 12/13/15 0717 12/13/15 1306  TROPONINI <0.03 <0.03 <0.03 <0.03   BNP (last 3 results) No results for input(s): PROBNP in the last 8760 hours. CBG: No results for input(s): GLUCAP in the last 168 hours. D-Dimer:  Recent Labs  12/12/15 1640  DDIMER 2.42*   Hgb A1c:  Recent Labs  12/13/15 0100  HGBA1C 6.2*   Lipid Profile:  Recent Labs  12/13/15 0100  CHOL 177  HDL 95  LDLCALC 65  TRIG 87  CHOLHDL 1.9   Thyroid function studies: No results for input(s): TSH, T4TOTAL, T3FREE, THYROIDAB in the last 72 hours.  Invalid input(s): FREET3 Anemia work up: No results for input(s): VITAMINB12, FOLATE, FERRITIN, TIBC, IRON, RETICCTPCT in the last 72 hours. Sepsis Labs:  Recent Labs Lab 12/12/15 1627 12/13/15 0526 12/13/15 0527 12/13/15 0717 12/14/15 0345  PROCALCITON  --  <0.10  --   --   --   WBC 10.1  --   --   --  15.7*  LATICACIDVEN  --   --  1.8 1.2  --    Microbiology Recent Results (from the past 240 hour(s))  Culture, blood (Routine X 2) w Reflex to ID Panel     Status: None (Preliminary result)   Collection Time: 12/13/15  1:00 AM  Result Value Ref Range Status   Specimen Description BLOOD LEFT HAND  Final   Special Requests BOTTLES DRAWN AEROBIC AND  ANAEROBIC 5CC  Final   Culture   Final    NO GROWTH 2 DAYS Performed at [facility]    Report Status PENDING  Incomplete  Culture, blood (Routine X 2) w Reflex to ID Panel     Status: None (Preliminary result)   Collection Time: 12/13/15  1:00 AM  Result Value Ref Range Status  Specimen Description BLOOD RIGHT HAND  Final   Special Requests BOTTLES DRAWN AEROBIC ONLY 5CC  Final   Culture   Final    NO GROWTH 2 DAYS Performed at [facility]    Report Status PENDING  Incomplete  Culture, expectorated sputum-assessment     Status: None   Collection Time: 12/14/15  4:17 AM  Result Value Ref Range Status   Specimen Description SPUTUM  Final   Special Requests NONE  Final   Sputum evaluation   Final    THIS SPECIMEN IS ACCEPTABLE. RESPIRATORY CULTURE REPORT TO FOLLOW.   Report Status 12/14/2015 FINAL  Final  Culture, respiratory (NON-Expectorated)     Status: None (Preliminary result)   Collection Time: 12/14/15  4:17 AM  Result Value Ref Range Status   Specimen Description SPUTUM  Final   Special Requests NONE  Final   Gram Stain   Final    ABUNDANT WBC PRESENT,BOTH PMN AND MONONUCLEAR MODERATE SQUAMOUS EPITHELIAL CELLS PRESENT MODERATE YEAST Performed at [facility]    Culture PENDING  Incomplete   Report Status PENDING  Incomplete     Medications:   . aspirin EC  81 mg Oral Daily  . dextromethorphan-guaiFENesin  1 tablet Oral BID  . enoxaparin (LOVENOX) injection  40 mg Subcutaneous Q24H  . escitalopram  20 mg Oral BID  . gabapentin  800 mg Oral TID  . ipratropium  0.5 mg Nebulization Q4H  . levalbuterol  1.25 mg Nebulization Q4H  . levofloxacin (LEVAQUIN) IV  500 mg Intravenous Q24H  . lisinopril  2.5 mg Oral Daily  . loratadine  10 mg Oral q morning - 10a  . methocarbamol  1,500 mg Oral TID  . methylPREDNISolone (SOLU-MEDROL) injection  60 mg Intravenous Q12H  . metoprolol tartrate  50 mg Oral BID  . nicotine  21 mg Transdermal Daily  .  pantoprazole  40 mg Oral Daily  . QUEtiapine  100 mg Oral QHS  . rosuvastatin  10 mg Oral Daily  . sodium chloride  3 mL Intravenous Q12H  . Vitamin D (Ergocalciferol)  50,000 Units Oral Weekly   Continuous Infusions:   Time spent: 25 min   LOS: 2 days   Charlynne Cousins  [facility] Pager [phone]  *Please refer to [email or web address], password [email or web address] to get updated schedule on who will round on this patient, as hospitalists switch teams weekly. If 7PM-7AM, please contact night-coverage at [email or web address], password [email or web address] for any overnight needs.  12/15/2015, 12:33 PM

## 2015-12-15 NOTE — Progress Notes (Signed)
Patient complains of pain 10/10, head and right rib area. Patient has received prns at each scheduled time without relief. Provider paged with update.

## 2015-12-15 NOTE — Plan of Care (Signed)
Problem: Pain Managment: Goal: General experience of comfort will improve Outcome: Not Met (add Reason) Despite efforts in pain management patient continues to rate pain 10/10 after medications given      

## 2015-12-16 LAB — BASIC METABOLIC PANEL
ANION GAP: 11 (ref 5–15)
BUN: 15 mg/dL (ref 6–20)
CHLORIDE: 93 mmol/L — AB (ref 101–111)
CO2: 37 mmol/L — ABNORMAL HIGH (ref 22–32)
Calcium: 9.3 mg/dL (ref 8.9–10.3)
Creatinine, Ser: 0.72 mg/dL (ref 0.44–1.00)
GFR calc Af Amer: >60 mL/min (ref >60)
Glucose, Bld: 171 mg/dL — ABNORMAL HIGH (ref 65–99)
POTASSIUM: 4.1 mmol/L (ref 3.5–5.1)
SODIUM: 141 mmol/L (ref 135–145)

## 2015-12-16 LAB — CULTURE, RESPIRATORY W GRAM STAIN

## 2015-12-16 LAB — CULTURE, RESPIRATORY

## 2015-12-16 MED ORDER — LIDOCAINE 5 % EX PTCH
1.0000 | MEDICATED_PATCH | CUTANEOUS | Status: DC
  Administered 2015-12-16 – 2015-12-17 (×2): 1 via TRANSDERMAL
  Filled 2015-12-16 (×2): qty 1

## 2015-12-16 MED ORDER — FLUCONAZOLE 100 MG PO TABS
100.0000 mg | ORAL_TABLET | Freq: Every day | ORAL | Status: DC
  Administered 2015-12-16 – 2015-12-17 (×2): 100 mg via ORAL
  Filled 2015-12-16 (×2): qty 1

## 2015-12-16 NOTE — Progress Notes (Signed)
Patient has removed arm identifier bracelet. It now sits at bedside at computer. Name and dob verified. Pt is not agreeable to new arm bracelet .

## 2015-12-16 NOTE — Progress Notes (Addendum)
Patient complains of continued pain in head and back 10/10 despite prn medications given. Patient also staed that she is "feeling depressed" "I feel like giving up".Provider paged with update regarding patients complaint x 2. VS stable 80, 150/86, 98% 2l. Will continue to monitor.

## 2015-12-16 NOTE — Progress Notes (Signed)
[facility] PROGRESS NOTE    Progress Note   "Leslieann S Hottel MRN:[id number] DOB: 03/28/1966 DOA: 12/12/2015 PCP: ARVIND,MOOGALI M, MD   Brief Narrative:   [name] is an 50 y.o. female has no history of depression and bipolar disorder who presents to the ED with cough shortness of breath suicidal ideation and abdominal pain, and he was found to have a BNP of 571 troponins were negative d-dimer was 2.4 UDS was positive for benzos CT image of the chest show no PE but showed right lower lobe pneumonia lower extremity Doppler was negative for DVT.  Assessment/Plan:   Acute on chronic respiratory failure with hypoxia (HCC): Multifactorial due to pneumonia  and possibly COPD. she continues to wheeze bilaterally not moving good air she relates her shortness of breath has not improved.  Continue IV Levaquin. Continue nebulizers, IVs steroids and antibiotics. Will add fluconazole, growing yeast in sputum.   Suicidal ideation/depression: Patient was evaluated by psychiatrist and recommended to adjust Seroquel and trazodone continue Neurontin and Xanax. He recommended outpatient psychiatric follow-up.  IBS: C. difficile pending continue Zofran for nausea.  Chronic low back pain: I'm not increasing her narcotics will continue current narcotic regimen. She relates her pain is now fully controlled at ketorolac. Will add lidocaine patch.   Essential  Hypertension: continue metoprolol and lisinopril. Will consider resuming lasix in am     DVT Prophylaxis - Lovenox ordered.  Family Communication: none Disposition Plan: Home 2-3 days, when respiratory status improved.  Code Status:     Code Status Orders        Start     Ordered   12/13/15 0047  Full code   Continuous     12/13/15 0049    Code Status History    Date Active Date Inactive Code Status Order ID Comments User Context   This patient has a current code status but no historical code status.        IV  Access:    Peripheral IV   Procedures and diagnostic studies:   No results found.   Medical Consultants:    None.  Anti-Infectives:   Anti-infectives    Start     Dose/Rate Route Frequency Ordered Stop   12/14/15 1600  levofloxacin (LEVAQUIN) IVPB 500 mg     500 mg 100 mL/hr over 60 Minutes Intravenous Every 24 hours 12/14/15 1250     01" /19/17 0900  vancomycin (VANCOCIN) IVPB 1000 mg/200 mL premix  Status:  Discontinued     1,000 mg 200 mL/hr over 60 Minutes Intravenous Every 8 hours 12/13/15 0412 12/14/15 1250   12/13/15 0700  piperacillin-tazobactam (ZOSYN) IVPB 3.375 g  Status:  Discontinued     3.375 g 12.5 mL/hr over 240 Minutes Intravenous Every 8 hours 12/13/15 0412 12/14/15 1250   12/12/15 2345  piperacillin-tazobactam (ZOSYN) IVPB 3.375 g     3.375 g 100 mL/hr over 30 Minutes Intravenous STAT 12/12/15 2341 12/13/15 0148   12/12/15 2345  vancomycin (VANCOCIN) IVPB 1000 mg/200 mL premix     1,000 mg 200 mL/hr over 60 Minutes Intravenous STAT 12/12/15 2341 12/13/15 0148      Subjective:    Reham S [place], still complaining of her chronic pain.  breathing ok.  Asking for dilaudid  Objective:    Filed Vitals:   12/16/15 0756 12/16/15 1100 12/16/15 1236 12/16/15 1338  BP:  160/90  137/78  Pulse:  84  72  Temp:    98.3 F (36.8 C)  TempSrc:  Oral  Resp:    20  Height:      Weight:      SpO2: 96%  97% 91%    Intake/Output Summary (Last 24 hours) at 12/16/15 1430 Last data filed at 12/16/15 1339  Gross per 24 hour  Intake   1200 ml  Output      0 ml  Net   1200 ml   Filed Weights   12/14/15 0456 12/15/15 0503 12/16/15 0525  Weight: 83.6 kg (184 lb 4.9 oz) 83.598 kg (184 lb 4.8 oz) 85.911 kg (189 lb 6.4 oz)    Exam: Gen:  NAD Cardiovascular:  RRR, No murmurs Chest and lungs:  Good air movement with wheezing bilaterally. Abdomen:  Abdomen soft, NT/ND, + BS Extremities:  No C/E/C   Data Reviewed:    Labs: Basic Metabolic  Panel:  Recent Labs Lab 12/12/15 1627 12/14/15 0345 12/15/15 1326 12/16/15 0526  NA 137 143 142 141  K 4.0 4.1 3.8 4.1  CL 95* 94* 94* 93*  CO2 30 40* 38* 37*  GLUCOSE 128* 131* 139* 171*  BUN 9 19 15 15   CREATININE 0.61 0.82 0.63 0.72  CALCIUM 9.4 9.5 9.4 9.3   GFR Estimated Creatinine Clearance: 87.4 mL/min (by C-G formula based on Cr of 0.72). Liver Function Tests:  Recent Labs Lab 12/12/15 1627 12/14/15 0345  AST 17 11*  ALT 23 16  ALKPHOS 97 84  BILITOT 0.4 0.3  PROT 6.6 5.8*  ALBUMIN 3.9 3.3*    Recent Labs Lab 12/12/15 1714  LIPASE 12   No results for input(s): AMMONIA in the last 168 hours. Coagulation profile  Recent Labs Lab 12/13/15 0100  INR 0.96    CBC:  Recent Labs Lab 12/12/15 1627 12/14/15 0345  WBC 10.1 15.7*  HGB 10.7* 10.5*  HCT 34.1* 35.1*  MCV 91.2 96.2  PLT 323 311   Cardiac Enzymes:  Recent Labs Lab 12/12/15 1640 12/13/15 0100 12/13/15 0717 12/13/15 1306  TROPONINI <0.03 <0.03 <0.03 <0.03   BNP (last 3 results) No results for input(s): PROBNP in the last 8760 hours. CBG: No results for input(s): GLUCAP in the last 168 hours. D-Dimer: No results for input(s): DDIMER in the last 72 hours. Hgb A1c: No results for input(s): HGBA1C in the last 72 hours. Lipid Profile: No results for input(s): CHOL, HDL, LDLCALC, TRIG, CHOLHDL, LDLDIRECT in the last 72 hours. Thyroid function studies: No results for input(s): TSH, T4TOTAL, T3FREE, THYROIDAB in the last 72 hours.  Invalid input(s): FREET3 Anemia work up: No results for input(s): VITAMINB12, FOLATE, FERRITIN, TIBC, IRON, RETICCTPCT in the last 72 hours. Sepsis Labs:  Recent Labs Lab 12/12/15 1627 12/13/15 0526 12/13/15 0527 12/13/15 0717 12/14/15 0345  PROCALCITON  --  <0.10  --   --   --   WBC 10.1  --   --   --  15.7*  LATICACIDVEN  --   --  1.8 1.2  --    Microbiology Recent Results (from the past 240 hour(s))  Culture, blood (Routine X 2) w Reflex  to ID Panel     Status: None (Preliminary result)   Collection Time: 12/13/15  1:00 AM  Result Value Ref Range Status   Specimen Description BLOOD LEFT HAND  Final   Special Requests BOTTLES DRAWN AEROBIC AND ANAEROBIC 5CC  Final   Culture   Final    NO GROWTH 2 DAYS Performed at [facility]    Report Status PENDING  Incomplete  Culture, blood (  Routine X 2) w Reflex to ID Panel     Status: None (Preliminary result)   Collection Time: 12/13/15  1:00 AM  Result Value Ref Range Status   Specimen Description BLOOD RIGHT HAND  Final   Special Requests BOTTLES DRAWN AEROBIC ONLY 5CC  Final   Culture   Final    NO GROWTH 2 DAYS Performed at [facility]    Report Status PENDING  Incomplete  Culture, expectorated sputum-assessment     Status: None   Collection Time: 12/14/15  4:17 AM  Result Value Ref Range Status   Specimen Description SPUTUM  Final   Special Requests NONE  Final   Sputum evaluation   Final    THIS SPECIMEN IS ACCEPTABLE. RESPIRATORY CULTURE REPORT TO FOLLOW.   Report Status 12/14/2015 FINAL  Final  Culture, respiratory (NON-Expectorated)     Status: None   Collection Time: 12/14/15  4:17 AM  Result Value Ref Range Status   Specimen Description SPUTUM  Final   Special Requests NONE  Final   Gram Stain   Final    ABUNDANT WBC PRESENT,BOTH PMN AND MONONUCLEAR MODERATE SQUAMOUS EPITHELIAL CELLS PRESENT MODERATE YEAST Performed at [facility]    Culture   Final    MODERATE YEAST CONSISTENT WITH CANDIDA SPECIES Performed at [facility]    Report Status 12/16/2015 FINAL  Final     Medications:   . aspirin EC  81 mg Oral Daily  . dextromethorphan-guaiFENesin  1 tablet Oral BID  . enoxaparin (LOVENOX) injection  40 mg Subcutaneous Q24H  . escitalopram  20 mg Oral BID  . gabapentin  800 mg Oral TID  . ipratropium-albuterol  3 mL Nebulization Q4H WA  . levofloxacin (LEVAQUIN) IV  500 mg Intravenous Q24H  . lisinopril  2.5 mg  Oral Daily  . loratadine  10 mg Oral q morning - 10a  . magic mouthwash  5 mL Oral TID  . methocarbamol  1,500 mg Oral TID  . methylPREDNISolone (SOLU-MEDROL) injection  60 mg Intravenous Q12H  . metoprolol tartrate  50 mg Oral BID  . nicotine  21 mg Transdermal Daily  . pantoprazole  40 mg Oral Daily  . QUEtiapine  100 mg Oral QHS  . rosuvastatin  10 mg Oral Daily  . sodium chloride  3 mL Intravenous Q12H  . Vitamin D (Ergocalciferol)  50,000 Units Oral Weekly   Continuous Infusions:   Time spent: 25 min   LOS: 3 days   Niel Hummer A  [facility] Pager [phone] 12/16/2015, 2:30 PM

## 2015-12-17 ENCOUNTER — Inpatient Hospital Stay (HOSPITAL_COMMUNITY): Payer: Medicaid Other

## 2015-12-17 DIAGNOSIS — S2243XA Multiple fractures of ribs, bilateral, initial encounter for closed fracture: Secondary | ICD-10-CM

## 2015-12-17 LAB — BASIC METABOLIC PANEL
ANION GAP: 11 (ref 5–15)
BUN: 18 mg/dL (ref 6–20)
CALCIUM: 9.2 mg/dL (ref 8.9–10.3)
CO2: 34 mmol/L — ABNORMAL HIGH (ref 22–32)
Chloride: 93 mmol/L — ABNORMAL LOW (ref 101–111)
Creatinine, Ser: 0.72 mg/dL (ref 0.44–1.00)
Glucose, Bld: 211 mg/dL — ABNORMAL HIGH (ref 65–99)
Potassium: 4.2 mmol/L (ref 3.5–5.1)
Sodium: 138 mmol/L (ref 135–145)

## 2015-12-17 LAB — CBC
HCT: 36.5 % (ref 36.0–46.0)
HEMOGLOBIN: 10.9 g/dL — AB (ref 12.0–15.0)
MCH: 28.6 pg (ref 26.0–34.0)
MCHC: 29.9 g/dL — ABNORMAL LOW (ref 30.0–36.0)
MCV: 95.8 fL (ref 78.0–100.0)
Platelets: 284 10*3/uL (ref 150–400)
RBC: 3.81 MIL/uL — AB (ref 3.87–5.11)
RDW: 15.6 % — ABNORMAL HIGH (ref 11.5–15.5)
WBC: 13.4 10*3/uL — AB (ref 4.0–10.5)

## 2015-12-17 MED ORDER — PREDNISONE 20 MG PO TABS
40.0000 mg | ORAL_TABLET | Freq: Every day | ORAL | Status: DC
  Administered 2015-12-17: 40 mg via ORAL
  Filled 2015-12-17: qty 2

## 2015-12-17 MED ORDER — LEVOFLOXACIN 750 MG PO TABS: 750.0000 mg | ORAL_TABLET | Freq: Every day | ORAL | Status: DC

## 2015-12-17 MED ORDER — VITAMIN D (ERGOCALCIFEROL) 1.25 MG (50000 UNIT) PO CAPS: 50000.0000 [IU] | ORAL_CAPSULE | ORAL | Status: DC

## 2015-12-17 MED ORDER — DICLOFENAC SODIUM 1 % TD GEL
4.0000 g | Freq: Four times a day (QID) | TRANSDERMAL | Status: DC
  Administered 2015-12-17: 4 g via TOPICAL
  Filled 2015-12-17: qty 100

## 2015-12-17 MED ORDER — FLUCONAZOLE 100 MG PO TABS: 100.0000 mg | ORAL_TABLET | Freq: Every day | ORAL | Status: DC

## 2015-12-17 MED ORDER — VITAMIN D 1000 UNITS PO TABS
1000.0000 [IU] | ORAL_TABLET | Freq: Every day | ORAL | Status: DC
  Administered 2015-12-17: 1000 [IU] via ORAL
  Filled 2015-12-17: qty 1

## 2015-12-17 MED ORDER — CALCIUM CARBONATE ANTACID 500 MG PO CHEW
1.0000 | CHEWABLE_TABLET | Freq: Two times a day (BID) | ORAL | Status: DC
  Administered 2015-12-17: 200 mg via ORAL
  Filled 2015-12-17: qty 1

## 2015-12-17 MED ORDER — KETOROLAC TROMETHAMINE 30 MG/ML IJ SOLN
30.0000 mg | Freq: Four times a day (QID) | INTRAMUSCULAR | Status: DC
  Administered 2015-12-17: 30 mg via INTRAVENOUS
  Filled 2015-12-17: qty 1

## 2015-12-17 MED ORDER — HYDROMORPHONE HCL 2 MG/ML IJ SOLN: 2.0000 mg | Freq: Every day | INTRAMUSCULAR | Status: DC

## 2015-12-17 MED ORDER — HYDROMORPHONE HCL 1 MG/ML IJ SOLN
1.0000 mg | Freq: Once | INTRAMUSCULAR | Status: AC
  Administered 2015-12-17: 1 mg via INTRAVENOUS
  Filled 2015-12-17: qty 1

## 2015-12-17 MED ORDER — PREDNISONE 10 MG PO TABS: 40.0000 mg | ORAL_TABLET | Freq: Every day | ORAL | Status: DC

## 2015-12-17 MED ORDER — DM-GUAIFENESIN ER 30-600 MG PO TB12: 1.0000 | ORAL_TABLET | Freq: Two times a day (BID) | ORAL | Status: DC

## 2015-12-17 MED ORDER — TRAMADOL HCL 50 MG PO TABS
100.0000 mg | ORAL_TABLET | Freq: Four times a day (QID) | ORAL | Status: DC
  Administered 2015-12-17: 100 mg via ORAL
  Filled 2015-12-17 (×2): qty 2

## 2015-12-17 MED ORDER — CALCIUM CARBONATE ANTACID 500 MG PO CHEW: 1.0000 | CHEWABLE_TABLET | Freq: Two times a day (BID) | ORAL | Status: DC

## 2015-12-17 NOTE — Progress Notes (Signed)
Pt selected [facility] for HH needs. Referral given to in house rep.  

## 2015-12-17 NOTE — Progress Notes (Signed)
12/17/15  1800  Reviewed discharge instructions with patient. Patient verbalized understanding of discharge instructions. Copy of discharge instructions and prescriptions given to patient.  Patient left via [facility] [place] that her boyfriend called to come pick her up.

## 2015-12-17 NOTE — Progress Notes (Signed)
Physical Therapy Treatment Patient Details Name: [name] MRN: [id number] DOB: 09-24-1966 Today's Date: 12/17/2015    History of Present Illness Pt is a 50 yo female admitted with SOB, cough and suicidal ideation.  Pt with B leg edema, diarrhea, abdominal pain.  Pt with COPD exacerbation on top of treating pneumonia for last week.  No LE edema noted during session today.     PT Comments    Pt does not require physical assist for mobility however demonstrates limited activity tolerance and feeling "bloating", "heaviness".    SATURATION QUALIFICATIONS: (This note is used to comply with regulatory documentation for home oxygen)  Patient Saturations on Room Air at Rest = 94%  Patient Saturations on Room Air while Ambulating = 77%  Patient Saturations on 2 Liters of oxygen while Ambulating = 91%  Please briefly explain why patient needs home oxygen: to achieve therapeutic level  Follow Up Recommendations  Home health PT     Equipment Recommendations       Recommendations for Other Services       Precautions / Restrictions Precautions Precaution Comments: monitor O2 sats Restrictions Weight Bearing Restrictions: No    Mobility  Bed Mobility Overal bed mobility: Independent             General bed mobility comments: No assist needed.  Transfers Overall transfer level: Modified independent Equipment used: None             General transfer comment: used IV pole  Ambulation/Gait Ambulation/Gait assistance: Supervision Ambulation Distance (Feet): 145 Feet Assistive device: None (used IV pole)   Gait velocity: decreased   General Gait Details: RA decreased to 77% with 3/4 DOE and c/o feeling "bloated", "heavy".  Reapplied 2 lts sats to 91% HR 124.  Limited activity tolwerance.  Overall feels "bad".    Stairs            Wheelchair Mobility    Modified Rankin (Stroke Patients Only)       Balance                                    Cognition Arousal/Alertness: Awake/alert Behavior During Therapy: WFL for tasks assessed/performed Overall Cognitive Status: Within Functional Limits for tasks assessed                      Exercises      General Comments        Pertinent Vitals/Pain Pain Assessment: Faces Faces Pain Scale: Hurts a little bit Pain Location: ABD Pain Descriptors / Indicators: Discomfort Pain Intervention(s): Monitored during session;Repositioned    Home Living                      Prior Function            PT Goals (current goals can now be found in the care plan section) Progress towards PT goals: Progressing toward goals    Frequency  Min 3X/week    PT Plan      Co-evaluation             End of Session Equipment Utilized During Treatment: Gait belt;Oxygen Activity Tolerance: Treatment limited secondary to medical complications (Comment) Patient left: in bed;with call bell/phone within reach     Time: 1454-1425 PT Time Calculation (min) (ACUTE ONLY): 10 min  Charges:  $Gait Training: 8-22 mins  G Codes:      Rica Koyanagi  PTA [facility]  Acute  Rehab Pager      [phone]

## 2015-12-17 NOTE — Discharge Summary (Signed)
Physician Discharge Summary  [name] WF:[id number] DOB: 05/06/1966 DOA: 12/12/2015  PCP: [name], MD  Admit date: 12/12/2015 Discharge date: 12/17/2015  Time spent: 50 minutes  Recommendations for Outpatient Follow-up:  1. Home health RN to follow  Discharge Condition: stable    Discharge Diagnoses:  Principal Problem:   CAP (community acquired pneumonia) Active Problems:   Chronic low back pain   COPD exacerbation (HCC)   Hypertension   Arthritis   Bipolar 1 disorder (HCC)   Depression   Acute on chronic respiratory failure with hypoxia (HCC)   IBS (irritable bowel syndrome)   Abdominal pain   History of present illness:  [name] is an 50 y.o. female has no history of depression and bipolar disorder who presents to the ED with cough shortness of breath, chest and abdominal pain- found to have a BNP of 571- troponins were negative d-dimer was 2.4 UDS was positive for benzos CT image of the chest show no PE but showed right lower lobe pneumonia lower extremity Doppler was negative for DVT.  Hospital Course:  Acute on chronic respiratory failure with hypoxia (HCC)/ pneumonia (CAP) Multifactorial due to pneumonia and possibly COPD.  Continue nebulizers, taper Prednisone- cont Levaquin for total 7 days Added fluconazole, growing yeast in sputum - will go home on 2 L O2  Bilateral Rib fractures - oddly enough these are due to her severe cough- no recent injuries - cont Oxycodone - added IS, calcium and Vit D  Suicidal ideation/depression: Patient was evaluated by psychiatrist and recommended to adjust Seroquel and trazodone continue Neurontin and Xanax. He recommended outpatient psychiatric follow-up.  IBS: C. difficile pending continue Zofran for nausea.  Chronic low back pain: Also has pain in mid upper back right side- add Voltaren gel- already on narcotics - see above  Essential Hypertension: continue metoprolol and  lisinopril.    Discharge Exam: Filed Weights   12/15/15 0503 12/16/15 0525 12/17/15 0453  Weight: 83.598 kg (184 lb 4.8 oz) 85.911 kg (189 lb 6.4 oz) 87.771 kg (193 lb 8 oz)   Filed Vitals:   12/17/15 0949 12/17/15 1440  BP: 146/82 158/89  Pulse: 94 84  Temp:  98.2 F (36.8 C)  Resp:  19    General: AAO x 3, no distress Cardiovascular: RRR, no murmurs  Respiratory: clear to auscultation bilaterally GI: soft, non-tender, non-distended, bowel sound positive  Discharge Instructions You were cared for by a hospitalist during your hospital stay. If you have any questions about your discharge medications or the care you received while you were in the hospital after you are discharged, you can call the unit and asked to speak with the hospitalist on call if the hospitalist that took care of you is not available. Once you are discharged, your primary care physician will handle any further medical issues. Please note that NO REFILLS for any discharge medications will be authorized once you are discharged, as it is imperative that you return to your primary care physician (or establish a relationship with a primary care physician if you do not have one) for your aftercare needs so that they can reassess your need for medications and monitor your lab values.      Discharge Instructions    Diet - low sodium heart healthy    Complete by:  As directed      Increase activity slowly    Complete by:  As directed  Medication List    STOP taking these medications        ibuprofen 800 MG tablet  Commonly known as:  ADVIL,MOTRIN     phenazopyridine 200 MG tablet  Commonly known as:  PYRIDIUM      TAKE these medications        albuterol (2.5 MG/3ML) 0.083% nebulizer solution  Commonly known as:  PROVENTIL  Take 2.5 mg by nebulization every 6 (six) hours as needed for wheezing or shortness of breath.     ALPRAZolam 1 MG tablet  Commonly known as:  XANAX  Take 1 mg by mouth  2 (two) times daily.     budesonide-formoterol 160-4.5 MCG/ACT inhaler  Commonly known as:  SYMBICORT  Inhale 2 puffs into the lungs 2 (two) times daily.     calcium carbonate 500 MG chewable tablet  Commonly known as:  TUMS - dosed in mg elemental calcium  Chew 1 tablet (200 mg of elemental calcium total) by mouth 2 (two) times daily.     dextromethorphan-guaiFENesin 30-600 MG 12hr tablet  Commonly known as:  MUCINEX DM  Take 1 tablet by mouth 2 (two) times daily.     ergocalciferol 50000 units capsule  Commonly known as:  VITAMIN D2  Take 50,000 Units by mouth once a week. Saturday     escitalopram 20 MG tablet  Commonly known as:  LEXAPRO  Take 20 mg by mouth 2 (two) times daily.     esomeprazole 40 MG capsule  Commonly known as:  NEXIUM  Take 40 mg by mouth daily at 12 noon.     fluconazole 100 MG tablet  Commonly known as:  DIFLUCAN  Take 1 tablet (100 mg total) by mouth daily.     gabapentin 400 MG capsule  Commonly known as:  NEURONTIN  Take 800 mg by mouth 3 (three) times daily. 800mg  in the morning, 800mg  in the evening, and 1600mg  at bedtime     levofloxacin 750 MG tablet  Commonly known as:  LEVAQUIN  Take 1 tablet (750 mg total) by mouth daily.     loratadine 10 MG tablet  Commonly known as:  CLARITIN  Take 10 mg by mouth every morning.     methocarbamol 750 MG tablet  Commonly known as:  ROBAXIN  Take 1,500 mg by mouth 3 (three) times daily.     metoprolol tartrate 25 MG tablet  Commonly known as:  LOPRESSOR  Take 25 mg by mouth 2 (two) times daily.     oxyCODONE 15 MG immediate release tablet  Commonly known as:  ROXICODONE  Take 15 mg by mouth every 4 (four) hours as needed for pain.     predniSONE 10 MG tablet  Commonly known as:  DELTASONE  Take 4 tablets (40 mg total) by mouth daily with breakfast. 40 mg x 2 days, 30 mg x 2 days, 20 mg x 2 days, 10 mg x 2 days and then 5 mg x 2 days     promethazine 25 MG tablet  Commonly known as:   PHENERGAN  Take 25 mg by mouth every 8 (eight) hours as needed for nausea or vomiting.     QUEtiapine 50 MG tablet  Commonly known as:  SEROQUEL  Take 50 mg by mouth at bedtime.     rosuvastatin 10 MG tablet  Commonly known as:  CRESTOR  Take 10 mg by mouth daily.       Allergies  Allergen Reactions  . Cymbalta [Duloxetine Hcl] Other (  See Comments)    Mood changes...mean  . Paxil [Paroxetine Hcl] Other (See Comments)    Mean and aggresive  . Wellbutrin [Bupropion] Other (See Comments)    Rapid heartrate  . Zoloft [Sertraline] Other (See Comments)    Anger.      The results of significant diagnostics from this hospitalization (including imaging, microbiology, ancillary and laboratory) are listed below for reference.    Significant Diagnostic Studies: Dg Chest 2 View  12/12/2015  CLINICAL DATA:  Cough EXAM: CHEST  2 VIEW COMPARISON:  03/07/2015 FINDINGS: Cardiomediastinal silhouette is stable. No acute infiltrate or pleural effusion. No pulmonary edema. Mild degenerative changes mid thoracic spine. Mild infrahilar bronchitic changes. IMPRESSION: No acute infiltrate or pulmonary edema. Mild infrahilar bronchitic changes. Electronically Signed   By: Lahoma Crocker M.D.   On: 12/12/2015 17:05   Dg Ribs Bilateral  12/17/2015  CLINICAL DATA:  Two week history of bilateral rib pain. EXAM: BILATERAL RIBS - 3+ VIEW COMPARISON:  Chest x-ray from 12/12/2015. FINDINGS: Oblique views of the bilateral ribs were obtained. There are fractures of the right fourth through ninth ribs. Multiple left rib fractures are evident. Recent CT scan showed fractures of the left fifth through seventh ribs. No definite evidence for pneumothorax on the current exam. IMPRESSION: Bilateral rib fractures. Electronically Signed   By: Misty Stanley M.D.   On: 12/17/2015 12:45   Ct Angio Chest Pe W/cm &/or Wo Cm  12/12/2015  CLINICAL DATA:  50 year old female with history of COPD presenting with shortness of breath and  elevated D-dimer. EXAM: CT ANGIOGRAPHY CHEST WITH CONTRAST TECHNIQUE: Multidetector CT imaging of the chest was performed using the standard protocol during bolus administration of intravenous contrast. Multiplanar CT image reconstructions and MIPs were obtained to evaluate the vascular anatomy. CONTRAST:  13mL OMNIPAQUE IOHEXOL 350 MG/ML SOLN COMPARISON:  Radiograph dated 12/12/2015 FINDINGS: There is centrilobular emphysema. A patchy area of ground-glass and nodular density noted in the right middle lobe on most compatible with pneumonia and possibly bronchopneumonia. Small right pleural effusion with associated partial compressive atelectasis of the right lung base. The central airways are patent. The thoracic aorta appears unremarkable. No CT evidence of pulmonary embolism. No cardiomegaly or pericardial effusion. No hilar or mediastinal adenopathy. The esophagus is grossly unremarkable. There is no axillary adenopathy. The chest wall soft tissues appear unremarkable. Mild degenerative changes of the spine. 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 No acute fracture. The visualized upper abdomen appears unremarkable. Review of the MIP images confirms the above findings. IMPRESSION: No CT evidence of pulmonary embolism. Right middle lobe pneumonia Small right pleural effusion. Electronically Signed   By: Anner Crete M.D.   On: 12/12/2015 23:33    Microbiology: Recent Results (from the past 240 hour(s))  Culture, blood (Routine X 2) w Reflex to ID Panel     Status: None (Preliminary result)   Collection Time: 12/13/15  1:00 AM  Result Value Ref Range Status   Specimen Description BLOOD LEFT HAND  Final   Special Requests BOTTLES DRAWN AEROBIC AND ANAEROBIC 5CC  Final   Culture   Final    NO GROWTH 4 DAYS Performed at [facility]    Report Status PENDING  Incomplete  Culture, blood (Routine X 2) w Reflex to ID Panel     Status: None (Preliminary result)   Collection Time:  12/13/15  1:00 AM  Result Value Ref Range Status   Specimen Description BLOOD RIGHT HAND  Final  Special Requests BOTTLES DRAWN AEROBIC ONLY 5CC  Final   Culture   Final    NO GROWTH 4 DAYS Performed at [facility]    Report Status PENDING  Incomplete  Culture, expectorated sputum-assessment     Status: None   Collection Time: 12/14/15  4:17 AM  Result Value Ref Range Status   Specimen Description SPUTUM  Final   Special Requests NONE  Final   Sputum evaluation   Final    THIS SPECIMEN IS ACCEPTABLE. RESPIRATORY CULTURE REPORT TO FOLLOW.   Report Status 12/14/2015 FINAL  Final  Culture, respiratory (NON-Expectorated)     Status: None   Collection Time: 12/14/15  4:17 AM  Result Value Ref Range Status   Specimen Description SPUTUM  Final   Special Requests NONE  Final   Gram Stain   Final    ABUNDANT WBC PRESENT,BOTH PMN AND MONONUCLEAR MODERATE SQUAMOUS EPITHELIAL CELLS PRESENT MODERATE YEAST Performed at [facility]    Culture   Final    MODERATE YEAST CONSISTENT WITH CANDIDA SPECIES Performed at [facility]    Report Status 12/16/2015 FINAL  Final     Labs: Basic Metabolic Panel:  Recent Labs Lab 12/12/15 1627 12/14/15 0345 12/15/15 1326 12/16/15 0526 12/17/15 0434  NA 137 143 142 141 138  K 4.0 4.1 3.8 4.1 4.2  CL 95* 94* 94* 93* 93*  CO2 30 40* 38* 37* 34*  GLUCOSE 128* 131* 139* 171* 211*  BUN 9 19 15 15 18   CREATININE 0.61 0.82 0.63 0.72 0.72  CALCIUM 9.4 9.5 9.4 9.3 9.2   Liver Function Tests:  Recent Labs Lab 12/12/15 1627 12/14/15 0345  AST 17 11*  ALT 23 16  ALKPHOS 97 84  BILITOT 0.4 0.3  PROT 6.6 5.8*  ALBUMIN 3.9 3.3*    Recent Labs Lab 12/12/15 1714  LIPASE 12   No results for input(s): AMMONIA in the last 168 hours. CBC:  Recent Labs Lab 12/12/15 1627 12/14/15 0345 12/17/15 0434  WBC 10.1 15.7* 13.4*  HGB 10.7* 10.5* 10.9*  HCT 34.1* 35.1* 36.5  MCV 91.2 96.2 95.8  PLT 323 311 284    Cardiac Enzymes:  Recent Labs Lab 12/12/15 1640 12/13/15 0100 12/13/15 0717 12/13/15 1306  TROPONINI <0.03 <0.03 <0.03 <0.03   BNP: BNP (last 3 results)  Recent Labs  12/12/15 1640  BNP 571.9*    ProBNP (last 3 results) No results for input(s): PROBNP in the last 8760 hours.  CBG: No results for input(s): GLUCAP in the last 168 hours.     SignedDebbe Odea, MD [facility] 12/17/2015, 4:08 PM

## 2015-12-17 NOTE — Progress Notes (Addendum)
[facility] PROGRESS NOTE    Progress Note   [name] MRN:[id number] DOB: 03/12/1966 DOA: 12/12/2015 PCP: ARVIND,MOOGALI M, MD   Brief Narrative:   [name] is an 49 y.o. female has no history of depression and bipolar disorder who presents to the ED with cough shortness of breath suicidal ideation and abdominal pain, and he was found to have a BNP of 571 troponins were negative d-dimer was 2.4 UDS was positive for benzos CT image of the chest show no PE but showed right lower lobe pneumonia lower extremity Doppler was negative for DVT.  Assessment/Plan:   Acute on chronic respiratory failure with hypoxia (HCC)/ pneumonia (CAP)  Multifactorial due to pneumonia  and possibly COPD.   Continue IV Levaquin.  Continue nebulizers, IVs steroids and antibiotics.  Will add fluconazole, growing yeast in sputum  Bilateral Rib fractures - cont High dose Tramadol, Toradol and Oxycodone - Dilaudid at bedtime - add IS, calcium and Vit D  Suicidal ideation/depression: Patient was evaluated by psychiatrist and recommended to adjust Seroquel and trazodone continue Neurontin and Xanax. He recommended outpatient psychiatric follow-up.  IBS: C. difficile pending continue Zofran for nausea.  Chronic low back pain: Also has pain in mid upper back right side- add Voltaren gel- already on narcotics - see above  Essential  Hypertension: continue metoprolol and lisinopril.     DVT Prophylaxis - Lovenox ordered.  Family Communication: none Disposition Plan: Home 2-3 days, when respiratory status improved.  Code Status:     Code Status Orders        Start     Ordered   12/13/15 0047  Full code   Continuous     12/13/15 0049    Code Status History    Date Active Date Inactive Code Status Order ID Comments User Context   This patient has a current code status but no historical code status.        IV Access:    Peripheral IV   Procedures and diagnostic studies:     Dg Ribs Bilateral  12/17/2015  CLINICAL DATA:  Two week history of bilateral rib pain. EXAM: BILATERAL RIBS - 3+ VIEW COMPARISON:  Chest x-ray from 12/12/2015. FINDINGS: Oblique views of the bilateral ribs were obtained. There are fractures of the right fourth through ninth ribs. Multiple left rib fractures are evident. Recent CT scan showed fractures of the left fifth through seventh ribs. No definite evidence for pneumothorax on the current exam. IMPRESSION: Bilateral rib fractures. Electronically Signed   By: Eric  Mansell M.D.   On: 12/17/2015 12:45     Medical Consultants:    None.  Anti-Infectives:   Anti-infectives    Start     Dose/Rate Route Frequency Ordered Stop   12/16/15 1700  fluconazole (DIFLUCAN) tablet 100 mg     100 mg Oral Daily 12/16/15 1609     12/14/15 1600  levofloxacin (LEVAQUIN) IVPB 500 mg     500 mg 100 mL/hr over 60 Minutes Intravenous Every 24 hours 12/14/15 1250     01 /19/17 0900  vancomycin (VANCOCIN) IVPB 1000 mg/200 mL premix  Status:  Discontinued     1,000 mg 200 mL/hr over 60 Minutes Intravenous Every 8 hours 12/13/15 0412 12/14/15 1250   12/13/15 0700  piperacillin-tazobactam (ZOSYN) IVPB 3.375 g  Status:  Discontinued     3.375 g 12.5 mL/hr over 240 Minutes Intravenous Every 8 hours 12/13/15 0412 12/14/15 1250   12/12/15 2345  piperacillin-tazobactam (ZOSYN) IVPB 3.375 g  3.375 g 100 mL/hr over 30 Minutes Intravenous STAT 12/12/15 2341 12/13/15 0148   12/12/15 2345  vancomycin (VANCOCIN) IVPB 1000 mg/200 mL premix     1,000 mg 200 mL/hr over 60 Minutes Intravenous STAT 12/12/15 2341 12/13/15 0148      Subjective:    [name] has severe pain in her back. Cough is present but dyspnea is better. No other complaints.   Objective:    Filed Vitals:   12/17/15 0453 12/17/15 0839 12/17/15 0949 12/17/15 1203  BP: 147/96  146/82   Pulse: 82  94   Temp: 98.1 F (36.7 C)     TempSrc: Oral     Resp: 20     Height:      Weight:  87.771 kg (193 lb 8 oz)     SpO2: 94% 92% 92% 91%    Intake/Output Summary (Last 24 hours) at 12/17/15 1430 Last data filed at 12/17/15 0900  Gross per 24 hour  Intake    120 ml  Output      0 ml  Net    120 ml   Filed Weights   12/15/15 0503 12/16/15 0525 12/17/15 0453  Weight: 83.598 kg (184 lb 4.8 oz) 85.911 kg (189 lb 6.4 oz) 87.771 kg (193 lb 8 oz)    Exam: Gen:  NAD, AAO x 3.  Cardiovascular:  RRR, No murmurs Chest and lungs:  CTA b/l  Abdomen:  Abdomen soft, NT/ND, + BS Extremities:  No C/E/C   Data Reviewed:    Labs: Basic Metabolic Panel:  Recent Labs Lab 12/12/15 1627 12/14/15 0345 12/15/15 1326 12/16/15 0526 12/17/15 0434  NA 137 143 142 141 138  K 4.0 4.1 3.8 4.1 4.2  CL 95* 94* 94* 93* 93*  CO2 30 40* 38* 37* 34*  GLUCOSE 128* 131* 139* 171* 211*  BUN 9 19 15 15 18   CREATININE 0.61 0.82 0.63 0.72 0.72  CALCIUM 9.4 9.5 9.4 9.3 9.2   GFR Estimated Creatinine Clearance: 88.5 mL/min (by C-G formula based on Cr of 0.72). Liver Function Tests:  Recent Labs Lab 12/12/15 1627 12/14/15 0345  AST 17 11*  ALT 23 16  ALKPHOS 97 84  BILITOT 0.4 0.3  PROT 6.6 5.8*  ALBUMIN 3.9 3.3*    Recent Labs Lab 12/12/15 1714  LIPASE 12   No results for input(s): AMMONIA in the last 168 hours. Coagulation profile  Recent Labs Lab 12/13/15 0100  INR 0.96    CBC:  Recent Labs Lab 12/12/15 1627 12/14/15 0345 12/17/15 0434  WBC 10.1 15.7* 13.4*  HGB 10.7* 10.5* 10.9*  HCT 34.1* 35.1* 36.5  MCV 91.2 96.2 95.8  PLT 323 311 284   Cardiac Enzymes:  Recent Labs Lab 12/12/15 1640 12/13/15 0100 12/13/15 0717 12/13/15 1306  TROPONINI <0.03 <0.03 <0.03 <0.03   BNP (last 3 results) No results for input(s): PROBNP in the last 8760 hours. CBG: No results for input(s): GLUCAP in the last 168 hours. D-Dimer: No results for input(s): DDIMER in the last 72 hours. Hgb A1c: No results for input(s): HGBA1C in the last 72 hours. Lipid Profile: No  results for input(s): CHOL, HDL, LDLCALC, TRIG, CHOLHDL, LDLDIRECT in the last 72 hours. Thyroid function studies: No results for input(s): TSH, T4TOTAL, T3FREE, THYROIDAB in the last 72 hours.  Invalid input(s): FREET3 Anemia work up: No results for input(s): VITAMINB12, FOLATE, FERRITIN, TIBC, IRON, RETICCTPCT in the last 72 hours. Sepsis Labs:  Recent Labs Lab 12/12/15 1627 12/13/15 0526 12/13/15  [id number] 12/13/15 0717 12/14/15 0345 12/17/15 0434  PROCALCITON  --  <0.10  --   --   --   --   WBC 10.1  --   --   --  15.7* 13.4*  LATICACIDVEN  --   --  1.8 1.2  --   --    Microbiology Recent Results (from the past 240 hour(s))  Culture, blood (Routine X 2) w Reflex to ID Panel     Status: None (Preliminary result)   Collection Time: 12/13/15  1:00 AM  Result Value Ref Range Status   Specimen Description BLOOD LEFT HAND  Final   Special Requests BOTTLES DRAWN AEROBIC AND ANAEROBIC 5CC  Final   Culture   Final    NO GROWTH 3 DAYS Performed at [facility]    Report Status PENDING  Incomplete  Culture, blood (Routine X 2) w Reflex to ID Panel     Status: None (Preliminary result)   Collection Time: 12/13/15  1:00 AM  Result Value Ref Range Status   Specimen Description BLOOD RIGHT HAND  Final   Special Requests BOTTLES DRAWN AEROBIC ONLY 5CC  Final   Culture   Final    NO GROWTH 3 DAYS Performed at [facility]    Report Status PENDING  Incomplete  Culture, expectorated sputum-assessment     Status: None   Collection Time: 12/14/15  4:17 AM  Result Value Ref Range Status   Specimen Description SPUTUM  Final   Special Requests NONE  Final   Sputum evaluation   Final    THIS SPECIMEN IS ACCEPTABLE. RESPIRATORY CULTURE REPORT TO FOLLOW.   Report Status 12/14/2015 FINAL  Final  Culture, respiratory (NON-Expectorated)     Status: None   Collection Time: 12/14/15  4:17 AM  Result Value Ref Range Status   Specimen Description SPUTUM  Final   Special Requests  NONE  Final   Gram Stain   Final    ABUNDANT WBC PRESENT,BOTH PMN AND MONONUCLEAR MODERATE SQUAMOUS EPITHELIAL CELLS PRESENT MODERATE YEAST Performed at [facility]    Culture   Final    MODERATE YEAST CONSISTENT WITH CANDIDA SPECIES Performed at [facility]    Report Status 12/16/2015 FINAL  Final     Medications:   . aspirin EC  81 mg Oral Daily  . dextromethorphan-guaiFENesin  1 tablet Oral BID  . diclofenac sodium  4 g Topical QID  . enoxaparin (LOVENOX) injection  40 mg Subcutaneous Q24H  . escitalopram  20 mg Oral BID  . fluconazole  100 mg Oral Daily  . gabapentin  800 mg Oral TID  .  HYDROmorphone (DILAUDID) injection  2 mg Intravenous QHS  . ipratropium-albuterol  3 mL Nebulization Q4H WA  . ketorolac  30 mg Intravenous 4 times per day  . levofloxacin (LEVAQUIN) IV  500 mg Intravenous Q24H  . lidocaine  1 patch Transdermal Q24H  . lisinopril  2.5 mg Oral Daily  . loratadine  10 mg Oral q morning - 10a  . magic mouthwash  5 mL Oral TID  . methocarbamol  1,500 mg Oral TID  . metoprolol tartrate  50 mg Oral BID  . nicotine  21 mg Transdermal Daily  . pantoprazole  40 mg Oral Daily  . predniSONE  40 mg Oral Q breakfast  . QUEtiapine  100 mg Oral QHS  . rosuvastatin  10 mg Oral Daily  . sodium chloride  3 mL Intravenous Q12H  . traMADol  100 mg Oral 4 times per day  . Vitamin D (Ergocalciferol)  50,000 Units Oral Weekly   Continuous Infusions:   Time spent: 25 min   LOS: 4 days   Debbe Odea, MD  [facility] Pager [phone]  12/17/2015, 2:30 PM

## 2015-12-18 LAB — CULTURE, BLOOD (ROUTINE X 2)
CULTURE: NO GROWTH
Culture: NO GROWTH

## 2015-12-22 DIAGNOSIS — F332 Major depressive disorder, recurrent severe without psychotic features: Secondary | ICD-10-CM | POA: Insufficient documentation

## 2016-02-05 ENCOUNTER — Ambulatory Visit: Payer: Medicaid Other | Admitting: Allergy and Immunology

## 2016-02-19 ENCOUNTER — Ambulatory Visit: Payer: Medicaid Other | Admitting: Allergy and Immunology

## 2016-02-21 ENCOUNTER — Encounter: Payer: Self-pay | Admitting: Allergy and Immunology

## 2016-02-21 ENCOUNTER — Ambulatory Visit (INDEPENDENT_AMBULATORY_CARE_PROVIDER_SITE_OTHER): Payer: Medicaid Other | Admitting: Allergy and Immunology

## 2016-02-21 VITALS — BP 120/85 | HR 95 | Temp 97.8°F | Resp 19 | Ht 62.21 in | Wt 176.6 lb

## 2016-02-21 DIAGNOSIS — J310 Chronic rhinitis: Secondary | ICD-10-CM | POA: Diagnosis not present

## 2016-02-21 DIAGNOSIS — J4541 Moderate persistent asthma with (acute) exacerbation: Secondary | ICD-10-CM | POA: Diagnosis not present

## 2016-02-21 MED ORDER — MOMETASONE FURO-FORMOTEROL FUM 200-5 MCG/ACT IN AERO: 2.0000 | INHALATION_SPRAY | Freq: Two times a day (BID) | RESPIRATORY_TRACT | Status: DC

## 2016-02-21 MED ORDER — AZELASTINE HCL 0.15 % NA SOLN: 1.0000 | Freq: Two times a day (BID) | NASAL | Status: DC

## 2016-02-21 NOTE — Patient Instructions (Addendum)
Take Home Sheet  1. Avoidance: Mite, Mold and Pollen and significant temperature/weather changes.   2. Nasal Spray: Azelastine one spray(s) each nostril twice daily for stuffy nose or drainage.    3. Inhalers:  Rescue: ProAir 2  puffs every 4 hours as needed for cough or wheeze.       -May use 2 puffs 10-20 minutes prior to exercise.    Preventative: Dulera 200 2 puffs twice daily (Rinse, gargle, and spit out after use).    STOP SYMBICORT  4. Prednisone 30mg  now and 20mg  once daily for 3 days.  5. Other: Decrease cig use and choose quit date   6. Nasal Saline wash each evening at shower time.   7. Follow up Visit: 2 weeks or sooner if needed.    May consider adding Spiriva.  Websites that have reliable Patient information: 1. American Academy of Asthma, Allergy, & Immunology: www.aaaai.org 2. Food Allergy Network: www.foodallergy.org 3. Mothers of Asthmatics: www.aanma.org 4. [place]: DiningCalendar.de 5. American College of Allergy, Asthma, & Immunology: https://robertson.info/ or www.acaai.org  Control of House Dust Mite Allergen  House dust mites play a major role in allergic asthma and rhinitis.  They occur in environments with high humidity wherever human skin, the food for dust mites is found. High levels have been detected in dust obtained from mattresses, pillows, carpets, upholstered furniture, bed covers, clothes and soft toys.  The principal allergen of the house dust mite is found in its feces.  A gram of dust may contain 1,000 mites and 250,000 fecal particles.  Mite antigen is easily measured in the air during house cleaning activities.  1. Encase mattresses, including the box spring, and pillow, in an air tight cover.  Seal the zipper end of the encased mattresses with wide adhesive tape. 2. Wash the bedding in water of 130 degrees Farenheit weekly.  Avoid cotton comforters/quilts and flannel bedding: the most ideal bed covering is  the dacron comforter. 3. Remove all upholstered furniture from the bedroom. 4. Remove carpets, carpet padding, rugs, and non-washable window drapes from the bedroom.  Wash drapes weekly or use plastic window coverings. 5. Remove all non-washable stuffed toys from the bedroom.  Wash stuffed toys weekly. 6. Have the room cleaned frequently with a vacuum cleaner and a damp dust-mop.  The patient should not be in a room which is being cleaned and should wait 1 hour after cleaning before going into the room. 7. Close and seal all heating outlets in the bedroom.  Otherwise, the room will become filled with dust-laden air.  An electric heater can be used to heat the room. 8. Reduce indoor humidity to less than 50%.  Do not use a humidifier.  Reducing Pollen Exposure  The American Academy of Allergy, Asthma and Immunology suggests the following steps to reduce your exposure to pollen during allergy seasons.  9. Do not hang sheets or clothing out to dry; pollen may collect on these items. 10. Do not mow lawns or spend time around freshly cut grass; mowing stirs up pollen. 11. Keep windows closed at night.  Keep car windows closed while driving. 12. Minimize morning activities outdoors, a time when pollen counts are usually at their highest. 13. Stay indoors as much as possible when pollen counts or humidity is high and on windy days when pollen tends to remain in the air longer. 14. Use air conditioning when possible.  Many air conditioners have filters that trap the pollen spores. 15. Use  a HEPA room air filter to remove pollen form the indoor air you breathe.  Control of Mold Allergen  Mold and fungi can grow on a variety of surfaces provided certain temperature and moisture conditions exist.  Outdoor molds grow on plants, decaying vegetation and soil.  The major outdoor mold, Alternaria dn Cladosporium, are found in very high numbers during hot and dry conditions.  Generally, a late Summer - Fall  peak is seen for common outdoor fungal spores.  Rain will temporarily lower outdoor mold spore count, but counts rise rapidly when the rainy period ends.  The most important indoor molds are Aspergillus and Penicillium.  Dark, humid and poorly ventilated basements are ideal sites for mold growth.  The next most common sites of mold growth are the bathroom and the kitchen.  Outdoor [employer] 1. Use air conditioning and keep windows closed 2. Avoid exposure to decaying vegetation. 3. Avoid leaf raking. 4. Avoid grain handling. 5. Consider wearing a face mask if working in moldy areas.  Indoor Mold Control 1. Maintain humidity below 50%. 2. Clean washable surfaces with 5% bleach solution. 3. Remove sources e.g. Contaminated carpets.  Control of Cockroach Allergen  Cockroach allergen has been identified as an important cause of acute attacks of asthma, especially in urban settings.  There are fifty-five species of cockroach that exist in the [place], however only three, the [place], [place] species produce allergen that can affect patients with Asthma.  Allergens can be obtained from fecal particles, egg casings and secretions from cockroaches.  1. Remove food sources. 2. Reduce access to water. 3. Seal access and entry points. 4. Spray runways with 0.5-1% Diazinon or Chlorpyrifos 5. Blow boric acid power under stoves and refrigerator. 6. Place bait stations (hydramethylnon) at feeding sites.

## 2016-02-21 NOTE — Progress Notes (Signed)
NEW PATIENT NOTE  RE: [name] MRN: [id number] DOB: June 01, 1966 [facility] [place] [address] [place] [address] Date of Office Visit: 02/21/2016  Dear [name], MD:  I had the pleasure of seeing [name] today in initial evaluation as you recall-- Subjective:  [name] is a 50 y.o. female who presents today for Asthma  Assessment:   1. Chronic rhinitis.  2. Moderate persistent asthma, with acute exacerbation, In no respiratory distress with normal oxygenation.    3.      Complex medical history on multiple medication regime. Plan:   Meds ordered this encounter  Medications  . mometasone-formoterol (DULERA) 200-5 MCG/ACT AERO    Sig: Inhale 2 puffs into the lungs 2 (two) times daily.    Dispense:  1 Inhaler    Refill:  4  . Azelastine HCl 0.15 % SOLN    Sig: Place 1 spray into both nostrils 2 (two) times daily.    Dispense:  30 mL    Refill:  5   Patient Instructions  1. Avoidance: Mite, Mold and Pollen and significant temperature/weather changes. 2. Nasal Spray: Azelastine one spray(s) each nostril twice daily for stuffy nose or drainage.  3. Inhalers:  Rescue: ProAir 2  puffs every 4 hours as needed for cough or wheeze.       -May use 2 puffs 10-20 minutes prior to exercise.  Preventative: Dulera 200 2 puffs twice daily (Rinse, gargle, and spit out after use). STOP SYMBICORT 4. Prednisone 30mg  now and 20mg  once daily for 3 days. 5. Other: Decrease cig use and choose quit date 6. Nasal Saline wash each evening at shower time. 7. Follow up Visit: 2 weeks or sooner if needed.-- May consider adding Spiriva.  HPI: [name] presents to the office with recurring history of rhinorrhea, congestion, sneezing, itchy watery eyes, cough, wheeze, chest congestion/tightness and shortness of breath.  Her symptoms have been prominent for more than 10 years, but appear to be greater in the last 5 months.  She notices particular  increasing symptoms with outdoor exposures, fluctuant weather patterns, pollen, dust, animal dander, mold and strong odor/perfumes.  She has had systemic steroids multiple times each year, including urgent care visits.  [place] was interested in a different maintenance medication regime as she had been previously seen in our office 10 years ago and recently following with pulmonology, Dr. Glade Lloyd, but was concerned about recurring prednisone courses.  She complete antibiotics yesterday and noticed mild improvement and denies discolored drainage, sore throat, headache  or fever. She has had ED visits and recently using albuterol multiple times a day and have systemic steroids many  times in the last year.  She recalls hospitalization August 2016 for pneumonia.  Medical History: Past Medical History  Diagnosis Date  . Asthma   . COPD (chronic obstructive pulmonary disease) ([place])   . Back pain   . Scoliosis   . Hypertension   . Arthritis   . Bipolar 1 disorder ([place])   . Depression   . Fibroid   . Migraine   . Chronic low back pain 03/06/2014  . IBS (irritable bowel syndrome)    Surgical History: Past Surgical History  Procedure Laterality Date  . Cholecystectomy    . Tubal ligation Bilateral   . Dilation and curettage of uterus    . Right foot surgery    . Exploratory laparotomy      WHEN PATIENT WAS YOUNG   Family History: Family  History  Problem Relation Age of Onset  . Stroke Mother   . Hypertension Brother   . Hypertension Brother   . Hypertension Brother   . Allergic rhinitis Neg Hx   . Angioedema Neg Hx   . Asthma Neg Hx   . Atopy Neg Hx   . Eczema Neg Hx   . Immunodeficiency Neg Hx   . Urticaria Neg Hx    Social History: Social History  . Marital Status: Single    Spouse Name: N/A  . Number of Children: 2  . Years of Education: college   Occupational History  . disabled    Social History Main Topics  . Smoking status: Current Every Day Smoker -- 1.00 packs/day      Types: Cigarettes  . Smokeless tobacco: Never Used  . Alcohol Use: No  . Drug Use: No  . Sexual Activity: No   Social History Narrative  [place] receives disability and has been one pack a day smoker for 30 years.  [name] has a current medication list which includes the following prescription(s): albuterol, alprazolam, budesonide-formoterol, calcium carbonate, ergocalciferol, escitalopram, esomeprazole, gabapentin, hydrochlorothiazide, ibuprofen, loratadine, methocarbamol, oxycodone, promethazine, quetiapine, topiramate, azelastine hcl.   Drug Allergies: Allergies  Allergen Reactions  . Cymbalta [Duloxetine Hcl] Other (See Comments)    Mood changes...mean  . Paxil [Paroxetine Hcl] Other (See Comments)    Mean and aggresive  . Wellbutrin [Bupropion] Other (See Comments)    Rapid heartrate  . Zoloft [Sertraline] Other (See Comments)    Anger.   Environmental History: [name] lives in a 51 year old trailer for 5 years with carbonate floors, with central heat and air; stuffed mattress, non-feather pillow/comforter with bedroom humidifier, indoor dog and smokers.   Review of Systems  Constitutional: Negative for fever, weight loss and malaise/fatigue.  HENT: Positive for congestion. Negative for ear pain, hearing loss, nosebleeds and sore throat.   Eyes: Negative for discharge and redness.  Respiratory: Negative for shortness of breath.        Denies history of bronchitis and pneumonia.  Gastrointestinal: Negative for heartburn, nausea, vomiting, abdominal pain, diarrhea and constipation.  Genitourinary: Negative.   Musculoskeletal: Negative for myalgias and joint pain.  Skin: Negative.  Negative for itching and rash.  Neurological: Negative.  Negative for dizziness, seizures, weakness and headaches.  Endo/Heme/Allergies: Positive for environmental allergies.       Denies sensitivity to aspirin, NSAIDs, stinging insects, foods, latex, jewelry and cosmetics.  Immunological: No  chronic or recurring infections. Objective:   Filed Vitals:   02/21/16 0946  BP: 120/85  Pulse: 95  Temp: 97.8 F (36.6 C)  Resp: 19   SpO2 Readings from Last 1 Encounters:  02/21/16 95%   Physical Exam  Constitutional: She is well-developed, well-nourished, and in no distress.  Non-toxic appearance.  HENT:  Head: Atraumatic.  Right Ear: Tympanic membrane and ear canal normal.  Left Ear: Tympanic membrane and ear canal normal.  Nose: Mucosal edema present. No rhinorrhea. No epistaxis.  Mouth/Throat: Oropharynx is clear and moist and mucous membranes are normal. No oropharyngeal exudate, posterior oropharyngeal edema or posterior oropharyngeal erythema.  Neck: Neck supple.  Cardiovascular: Normal rate, S1 normal and S2 normal.   No murmur heard. Pulmonary/Chest: Effort normal. No respiratory distress. She has wheezes (post Xopenex/Atrovent neb: improved aeration with rare end expiratory wheeze without rhonchi or crackles. patient reports improved.). She has no rhonchi. She has no rales.  Lymphadenopathy:    She has no cervical adenopathy.   Diagnostics: Spirometry:  FVC  1.72--- 59%, FEV1 0.93--38%, FEF 25-75% 0.48--18%; postbronchodilator improvement, FVC 1.96--77%, FEV1 1.12--46%, FEF 25-75% 0.59--22%.       Myldred Raju M. Ishmael Holter, MD   cc: [name], MD

## 2016-02-22 ENCOUNTER — Other Ambulatory Visit: Payer: Self-pay

## 2016-03-04 ENCOUNTER — Ambulatory Visit: Payer: Medicaid Other | Admitting: Allergy and Immunology

## 2016-03-27 ENCOUNTER — Ambulatory Visit (INDEPENDENT_AMBULATORY_CARE_PROVIDER_SITE_OTHER): Payer: Medicaid Other | Admitting: Allergy and Immunology

## 2016-03-27 ENCOUNTER — Encounter: Payer: Self-pay | Admitting: Allergy and Immunology

## 2016-03-27 VITALS — BP 125/80 | HR 84 | Temp 98.0°F | Resp 18

## 2016-03-27 DIAGNOSIS — J310 Chronic rhinitis: Secondary | ICD-10-CM

## 2016-03-27 DIAGNOSIS — J4541 Moderate persistent asthma with (acute) exacerbation: Secondary | ICD-10-CM | POA: Diagnosis not present

## 2016-03-27 DIAGNOSIS — J4540 Moderate persistent asthma, uncomplicated: Secondary | ICD-10-CM

## 2016-03-27 MED ORDER — BECLOMETHASONE DIPROPIONATE 40 MCG/ACT IN AERS: 4.0000 | INHALATION_SPRAY | Freq: Every day | RESPIRATORY_TRACT | Status: DC

## 2016-03-27 MED ORDER — AEROCHAMBER PLUS FLO-VU MEDIUM MISC: Status: DC

## 2016-03-27 MED ORDER — TIOTROPIUM BROMIDE MONOHYDRATE 18 MCG IN CAPS: 18.0000 ug | ORAL_CAPSULE | Freq: Every day | RESPIRATORY_TRACT | Status: DC

## 2016-03-27 NOTE — Progress Notes (Signed)
FOLLOW UP NOTE  RE: [name] MRN: [id number] DOB: 04/06/1966 [facility] [place] [address] [address] Date of Office Visit: 03/27/2016  Subjective:  [name] is a 50 y.o. female who presents today for Asthma and Wheezing  Assessment:   1. Chronic rhinitis   2. Moderate persistent asthma.  2.      Chronic rhinitis. 3.      Unclear medication adherence. 4.      Complex medical history. 5.      Chronic tobacco use.  Plan:   Meds ordered this encounter  Medications  . DISCONTD: Spacer/Aero-Holding Chambers (AEROCHAMBER PLUS FLO-VU MEDIUM) MISC    Sig: Use with metered dose inhaler    Dispense:  1 each    Refill:  1  . DISCONTD: Spacer/Aero-Holding Chambers (AEROCHAMBER PLUS FLO-VU MEDIUM) MISC    Sig: Use with metered dose inhaler    Dispense:  1 each    Refill:  1  . Spacer/Aero-Holding Chambers (AEROCHAMBER PLUS FLO-VU MEDIUM) MISC    Sig: Use with metered dose inhaler    Dispense:  1 each    Refill:  1  . beclomethasone (QVAR) 40 MCG/ACT inhaler    Sig: Inhale 4 puffs into the lungs daily at 12 noon.    Dispense:  1 Inhaler    Refill:  3  . tiotropium (SPIRIVA HANDIHALER) 18 MCG inhalation capsule    Sig: Place 1 capsule (18 mcg total) into inhaler and inhale daily.    Dispense:  30 capsule    Refill:  2  1.   Prednisone 30 mg now. 2.   Consistently Use Dulera 229mcg 2 puffs twice daily, rinse, gargle and spit after use. (With spacer) 3.   Begin QVAR 51mcg 4 puffs midday.  Rinse, gargle and spit after use.  (With spacer) 4.   Continue Azelastine one spray twice daily after saline nasal wash. 5.   Begin Spiriva one puff once daily. 6.   Pro-Air HFA or albuterol neb as needed every 4 hours. 7.   Call office with any questions or concerns or persisting symptoms. 8.   Decrease cigarettee use and choosequit day. 9.   Follow-up in one month or sooner if needed and review further management options  --Xolair.  HPI: [name] returns to the office with cough and wheeze.  Since her initial visit in March, she reports having difficulty receiving medication out of her [facility] inhaler.  She did not communicate with the office or the pharmacy and had begun using albuterol several times a day.  She finds the warmer the weather, the greater her symptoms which have lessened with the more comfortable temperatures/rain patterns.  She is also reporting her Dulera inhalers are functioning in the last few days and denies chest tightness, difficulty breathing or shortness of breath.  There is occasional nasal congestion or drainage, but no headache, sore throat or recurring fever.  She did not bring her Dulera with her today.  Denies ED or urgent care visits, prednisone or antibiotic courses. Reports sleep and activity are normal.  [name] has a current medication list which includes the following prescription(s): albuterol, alprazolam, azelastine hcl, calcium carbonate, ergocalciferol, escitalopram, esomeprazole, gabapentin, hydrochlorothiazide, ibuprofen, loratadine, methocarbamol, metoprolol tartrate, mometasone-formoterol, oxycodone, promethazine, quetiapine, rosuvastatin, topiramate, ibuprofen.  Drug Allergies: Allergies  Allergen Reactions  . Cymbalta [Duloxetine Hcl] Other (See Comments)    Mood changes...mean  . Paxil [Paroxetine Hcl] Other (See Comments)    Mean  and aggresive  . Wellbutrin [Bupropion] Other (See Comments)    Rapid heartrate  . Zoloft [Sertraline] Other (See Comments)    Anger.   Objective:   Filed Vitals:   03/27/16 1350  BP: 125/80  Pulse: 84  Temp: 98 F (36.7 C)  Resp: 18   SpO2 Readings from Last 1 Encounters:  03/27/16 96%   Physical Exam  Constitutional: She is well-developed, well-nourished, and in no distress.  Non-toxic appearance.  HENT:  Head: Atraumatic.  Right Ear: Tympanic membrane and ear canal normal.  Left Ear: Tympanic membrane and ear canal normal.   Nose: Mucosal edema present. No rhinorrhea. No epistaxis.  Mouth/Throat: Oropharynx is clear and moist and mucous membranes are normal. No oropharyngeal exudate, posterior oropharyngeal edema or posterior oropharyngeal erythema.  Neck: Neck supple.  Cardiovascular: Normal rate, S1 normal and S2 normal.   No murmur heard. Pulmonary/Chest: Effort normal. No respiratory distress. She has wheezes (Post Xopenex/Atrovent: Improved aeration, patient reports improved.). She has no rhonchi. She has no rales.  Lymphadenopathy:    She has no cervical adenopathy.   Diagnostics: Spirometry:  FVC 1.74--60%, FEV1 0.97--40%, FEF 25-75% 0.48--18%, postbronchodilator improvement FVC 1.93--66%, FEV1 1.11--46%, FEF 25-75% 0.60--23%.    Zaryiah Barz M. Ishmael Holter, MD  cc: [name], MD

## 2016-03-27 NOTE — Patient Instructions (Addendum)
   Prednisone 30 mg now.  Use Dulera 276mcg 2 puffs twice daily, rinse, gargle and spit after use. (With spacer)  Begin QVAR 5mcg 4 puffs midday.  Rinse, gargle and spit after use.  (With spacer)  Continue Azelastine one spray twice daily after saline nasal wash.  Begin Spiriva one puff once daily.  Pro-Air HFA or albuterol neb as needed every 4 hours.  Call office with any questions or concerns or persisting symptoms.  Follow-up in one month or sooner if needed.

## 2016-04-14 DIAGNOSIS — F1123 Opioid dependence with withdrawal: Secondary | ICD-10-CM | POA: Insufficient documentation

## 2016-04-14 DIAGNOSIS — E871 Hypo-osmolality and hyponatremia: Secondary | ICD-10-CM | POA: Insufficient documentation

## 2016-04-14 DIAGNOSIS — F1193 Opioid use, unspecified with withdrawal: Secondary | ICD-10-CM | POA: Insufficient documentation

## 2016-04-30 ENCOUNTER — Ambulatory Visit: Payer: Medicaid Other | Admitting: Allergy and Immunology

## 2016-05-01 ENCOUNTER — Ambulatory Visit: Payer: Medicaid Other | Admitting: Allergy and Immunology

## 2016-05-04 ENCOUNTER — Other Ambulatory Visit: Payer: Self-pay | Admitting: Allergy and Immunology

## 2016-05-14 ENCOUNTER — Encounter: Payer: Self-pay | Admitting: Allergy and Immunology

## 2016-05-14 ENCOUNTER — Ambulatory Visit (INDEPENDENT_AMBULATORY_CARE_PROVIDER_SITE_OTHER): Payer: Medicaid Other | Admitting: Allergy and Immunology

## 2016-05-14 VITALS — BP 136/84 | HR 85 | Temp 98.1°F | Resp 16

## 2016-05-14 DIAGNOSIS — J4540 Moderate persistent asthma, uncomplicated: Secondary | ICD-10-CM | POA: Diagnosis not present

## 2016-05-14 DIAGNOSIS — J310 Chronic rhinitis: Secondary | ICD-10-CM | POA: Diagnosis not present

## 2016-05-14 MED ORDER — LEVOCETIRIZINE DIHYDROCHLORIDE 5 MG PO TABS: 5.0000 mg | ORAL_TABLET | Freq: Every evening | ORAL | Status: DC

## 2016-05-14 NOTE — Patient Instructions (Addendum)
    Use Saline nasal wash 2-4 times daily.  Continue Dulera, Spiriva, Astelin and QVAR.  Xyzal 5mg  each evening ---STOP Claritin.  ProAir 2 puffs every 4 hours as needed.  Follow-up in 3-4 months or sooner if needed.  Decrease cig use and choose quit date.

## 2016-05-14 NOTE — Progress Notes (Signed)
FOLLOW UP NOTE  RE: [name] MRN: [id number] DOB: 08/10/1966 [facility] [place] [address] [address] Date of Office Visit: 05/14/2016  Subjective:  [name] is a 50 y.o. female who presents today for Asthma and Sinusitis  Assessment:   1. Moderate persistent asthma, Intermittent cough, afebrile in no respiratory distress.  2. Chronic rhinitis., Recent increasing symptoms with associated postnasal drip, cough .  3.      Unlikely maintenance medication adherence. 4.      Chronic tobacco use/component of COPD. 5.      Complex medical/psychiatric history. Plan:   Meds ordered this encounter  Medications  . levocetirizine (XYZAL) 5 MG tablet    Sig: Take 1 tablet (5 mg total) by mouth every evening.    Dispense:  30 tablet    Refill:  5  1.  Use Saline nasal wash 2-4 times daily. 2.  Continue Dulera, Spiriva, Astelin consistently aspirin as prescribed and maintain QVAR to 4 puffs midday with spacer.  3.  Begin Xyzal 5mg  each evening ---STOP Claritin. 4.  ProAir 2 puffs every 4 hours as needed. 5.  Follow-up in 3-4 months or sooner if needed. 6.  Reviewed at length the importance of decreasing cig use and choose quit date now. 7.  Chey-does have Four 10 mg prednisone tablets at home if persisting symptoms (2 tablets daily for 2 days) 8.  Reviewed at length with Quenna importance of of her preventative medication regime and consistent long-term goal of avoiding recurring albuterol use and prednisone courses. 9.  May consider reevaluation of aeroallergen hypersensitivities as last testing with Dr. Neldon Mc was negative +/- sinus CT scan and evaluation with Dr. Chase Caller.  HPI: [name] returns to the office after recent ED visit for ear infection now completed antibiotics.  She reports cough, congestion, intermittent sinus pressure without fever, headache, sore throat symptoms have now improved.  Initially there appeared to be  wheeze, chest congestion, which she feels is particularly more prominent with humid weather, but nothing persisting.  She does note a cough and postnasal drip and is most recently noting greater nasal symptoms.  Continues cigarette use, at least a pack a day..  She reports using her maintenance medications?  Consistently.  Denies ED or urgent care visits, prednisone or antibiotic courses. Reports sleep and activity are normal.  [place] has a current medication list which includes the following prescription(s): albuterol, azelastine hcl, beclomethasone, calcium carbonate, clonazepam, ergocalciferol, escitalopram, gabapentin, ibuprofen, loratadine, methocarbamol, metoprolol tartrate, mometasone-formoterol, ondansetron, oxycodone, proair hfa, rosuvastatin, aerochamber plus flo-vu medium, tiotropium,.   Drug Allergies: Allergies  Allergen Reactions  . Cymbalta [Duloxetine Hcl] Other (See Comments)    Mood changes...mean  . Paxil [Paroxetine Hcl] Other (See Comments)    Mean and aggresive  . Promethazine Itching  . Tramadol Itching  . Wellbutrin [Bupropion] Other (See Comments)    Rapid heartrate  . Zoloft [Sertraline] Other (See Comments)    Anger.   Objective:   Filed Vitals:   05/14/16 1343  BP: 136/84  Pulse: 85  Temp: 98.1 F (36.7 C)  Resp: 16   SpO2 Readings from Last 1 Encounters:  05/14/16 95%   Physical Exam  Constitutional: She is well-developed, well-nourished, and in no distress.  Non-toxic appearance.  HENT:  Head: Atraumatic.  Right Ear: Tympanic membrane and ear canal normal.  Left Ear: Tympanic membrane and ear canal normal.  Nose: Mucosal edema present. No rhinorrhea. No epistaxis.  Mouth/Throat:  Oropharynx is clear and moist and mucous membranes are normal. No oropharyngeal exudate, posterior oropharyngeal edema or posterior oropharyngeal erythema.  Neck: Neck supple.  Cardiovascular: Normal rate, S1 normal and S2 normal.   No murmur heard. Pulmonary/Chest:  Effort normal. No respiratory distress. She has wheezes. She has no rhonchi. She has no rales.  Post Xopenex neb: Clear auscultation without wheeze, rhonchi or crackles, symmetric aeration and patient reports improved.  Lymphadenopathy:    She has no cervical adenopathy.   Diagnostics: Spirometry:  FVC 2.04--110%, FEV1 1.19--72%, postbronchodilator improvement, FVC 2.16--117%, FEV1 1.29--78%.  ACT=8 (as related to recent respiratory illness).   Pricila Bridge M. Ishmael Holter, MD  cc: [name], MD

## 2016-05-30 ENCOUNTER — Telehealth: Payer: Self-pay | Admitting: *Deleted

## 2016-05-30 NOTE — Telephone Encounter (Signed)
Made in error

## 2016-06-01 ENCOUNTER — Other Ambulatory Visit: Payer: Self-pay | Admitting: Allergy and Immunology

## 2016-06-23 ENCOUNTER — Other Ambulatory Visit: Payer: Self-pay | Admitting: Allergy and Immunology

## 2016-06-26 ENCOUNTER — Ambulatory Visit: Payer: Medicaid Other | Admitting: Allergy & Immunology

## 2016-06-30 ENCOUNTER — Other Ambulatory Visit: Payer: Self-pay | Admitting: Allergy and Immunology

## 2016-07-07 ENCOUNTER — Ambulatory Visit: Payer: Medicaid Other | Admitting: Allergy & Immunology

## 2016-07-24 ENCOUNTER — Other Ambulatory Visit: Payer: Self-pay | Admitting: Allergy & Immunology

## 2016-07-24 ENCOUNTER — Ambulatory Visit (INDEPENDENT_AMBULATORY_CARE_PROVIDER_SITE_OTHER): Payer: Medicaid Other | Admitting: Allergy & Immunology

## 2016-07-24 ENCOUNTER — Encounter: Payer: Self-pay | Admitting: Allergy & Immunology

## 2016-07-24 VITALS — BP 130/90 | HR 80 | Resp 20

## 2016-07-24 DIAGNOSIS — J4540 Moderate persistent asthma, uncomplicated: Secondary | ICD-10-CM | POA: Diagnosis not present

## 2016-07-24 DIAGNOSIS — B999 Unspecified infectious disease: Secondary | ICD-10-CM | POA: Diagnosis not present

## 2016-07-24 DIAGNOSIS — J310 Chronic rhinitis: Secondary | ICD-10-CM | POA: Diagnosis not present

## 2016-07-24 DIAGNOSIS — J449 Chronic obstructive pulmonary disease, unspecified: Secondary | ICD-10-CM | POA: Diagnosis not present

## 2016-07-24 MED ORDER — IPRATROPIUM BROMIDE 0.03 % NA SOLN: NASAL | 5 refills | Status: DC

## 2016-07-24 NOTE — Telephone Encounter (Signed)
Patient called and was seen today. She was suppose to be given samples for Aprovent, Spiriva, Delara, and Qvar and did not get them and would like these to be called into [facility] on [address].

## 2016-07-24 NOTE — Telephone Encounter (Signed)
Lm for pt to inform her that her samples are waiting up front.

## 2016-07-24 NOTE — Progress Notes (Signed)
FOLLOW UP  Date of Service/Encounter:  07/24/16   Assessment:   Recurrent infections - Plan: IgG, IgA, IgM, CBC with Differential/Platelet, Pneumococcal IM (14 Serotype)  Moderate persistent asthma, uncomplicated - Plan: Spirometry with Graph  Chronic rhinitis  Chronic obstructive pulmonary disease, unspecified COPD, unspecified chronic bronchitis type   Asthma Reportables:  Severity: : severe persistent  Risk: high Control: not well controlled  Seasonal Influenza Vaccine: no but encouraged    Plan/Recommendations:    1. Recurrent infections - Her recurrent sinopulmonary infections are likely due to her smoking. - We did discuss ciliary function and cigarette smoking's effect on that function. - Tanya is interested in quitting smoking, therefore I recommended that Tanya talk to her primary care physician. - We will get an immunology workup: immunoglobulin panel (IgG, IgA, IgM), complete blood count with differential, pneumococcal titers  2. Moderate persistent asthma with co-existing COPD - Continue with Dulera two puffs twice daily. - Use Spiriva at noon with Qvar four puffs at noon. - Use the spacer for Qvar and Dulera to make sure if gets down into your lungs. - Her lung function is unchanged from previous virus, therefore do not feel Tanya needs prednisone at this time.  3. Chronic rhinitis - Allergy testing has been negative in the past.  - We can plan to do skin testing at the next visit to see if any new sensitizations have emerged. - Continue with nasal saline and Astelin. - Add nasal Atrovent 1-2 sprays per nostril every 6 hours as needed for runny noses.  4. Return to clinic in three months.        Subjective:   [name] is a 50 y.o. [place] presenting today for follow up of  Chief Complaint  Patient presents with  . Allergies    pt has a hard time going outside with all the allergies. Tanya will get a sinus headache, and have pain in eyes and  ears.  .  [name] has a history of the following: Patient Active Problem List   Diagnosis Date Noted  . Leg edema 12/13/2015  . Acute CHF (congestive heart failure) ([place]) 12/13/2015  . Acute on chronic respiratory failure with hypoxia ([place]) 12/13/2015  . IBS (irritable bowel syndrome) 12/13/2015  . Abdominal pain 12/13/2015  . Suicidal ideation 12/13/2015  . CAP (community acquired pneumonia) 12/13/2015  . SOB (shortness of breath) 12/13/2015  . Dysuria 12/13/2015  . COPD exacerbation ([place])   . Back pain   . Hypertension   . Arthritis   . Bipolar 1 disorder ([place])   . Depression   . Essential hypertension   . Chronic low back pain 03/06/2014    History obtained from: chart review and patient.  [name] was referred by [name], MD.     [name] is a 50 y.o. [place] presenting for a follow up visit for asthma and allergies. Tanya was last seen in June 2017 by Dr. Ishmael Holter, who has since left the practice. At that time, Tanya was continued on all of her asthma medications, including Dulera, Spiriva, Astelin, and Qvar. Xyzal 5 mg was started. Tanya was continued on Pro Air 2 puffs every 4 hours as needed. Tanya had a discussion with Dr. Ishmael Holter about stopping her at smoking. There is also consideration of retesting her environmental allergies if no improvement. Of note, her last testing for environmental allergies was in August 2008 and was completely negative, including intradermals.   Since last visit, Tanya reports  that Tanya has been terrible. Tanya is in a less than stellar made during the entire visit. Tanya refuses to shake my hand when I enter the room. Tanya is on her Dulera 2 puffs twice a day. Tanya does have a spacer. Tanya is his Qvar 4 puffs at noon. Tanya also uses Spiriva 1 puff once a day. Tanya does not think any of her medications help. Tanya does endorse excellent compliance. Tanya does report coughing, which is triggered by going outside. Tanya reports that Tanya is allergic to the  outdoors.  [name] report that Tanya spends all of her time indoors with the air conditioning on. Tanya continues to get sick often, all for infections the upper and lower respiratory system. Tanya estimates that Tanya gets sinusitis 4-5 times a [place]. In fact, Tanya recently completed a course of amoxicillin 500 mg 3 times a day for sinusitis. Tanya does not that Tanya improved at all. Tanya estimates that Tanya gets pneumonias 1-2 times a [place]. Tanya also gets ear infections a few times per [place]. Tanya has never had an immune workup from what I can tell.  [name] does have a history of COPD. Tanya did see a pulmonologist who is associated with her primary care physician, but he "did not know anything". Therefore Tanya stopped seeing him. Tanya is interested in quitting smoking. Tanya has smoked for 30 years (half pack to a full pack per day). Tanya does occasionally get admitted for COPD exacerbations, at which time Tanya was started on prednisone.  [name] has a very complicated past medical history including fibromyalgia and bipolar disease. Tanya was on oxycodone for her pain, but Tanya recently got a new primary care physician who is not prescribing oxycodone at this time. Tanya has a complicated social history as well. Tanya and her husband are divorced. Tanya does have a daughter who lives [place] by," sends her reviewed text". It doesn't sound like they have a close relationship. [name]: They are living in [place] and Tanya has not heard from him in over one [place].  Otherwise, there have been no changes to the past medical history, surgical history, family history, or social history.     Review of Systems: a 14-point review of systems is pertinent for what is mentioned in HPI.  Otherwise, all other systems were negative. Constitutional: negative other than that listed in the HPI Eyes: negative other than that listed in the HPI Ears, nose, mouth, throat, and face: negative other than that  listed in the HPI Respiratory: negative other than that listed in the HPI Cardiovascular: negative other than that listed in the HPI Gastrointestinal: negative other than that listed in the HPI Genitourinary: negative other than that listed in the HPI Integument: negative other than that listed in the HPI Hematologic: negative other than that listed in the HPI Musculoskeletal: negative other than that listed in the HPI Neurological: negative other than that listed in the HPI Allergy/Immunologic: negative other than that listed in the HPI    Objective:   Blood pressure 130/90, pulse 80, resp. rate 20. There is no height or weight on file to calculate BMI.   Physical Exam:  General: Alert, interactive, in no acute distress. Frowning and grumpy. Not very pleasant.  HEENT: TMs pearly gray, turbinates edematous with thick discharge, post-pharynx erythematous. Neck: Supple without thyromegaly. Adenopathy: no enlarged lymph nodes appreciated in the anterior cervical, occipital, axillary, epitrochlear, inguinal, or popliteal regions Lungs:  Decreased breath sounds bilaterally without wheezing, rhonchi or rales. No increased work of breathing. CV: Normal S1, S2 without murmurs. Capillary refill <2 seconds.  Skin: Warm and dry, without lesions or rashes. Extremities:  No clubbing, cyanosis or edema. Neuro:   Grossly intact.   Diagnostic studies:  Spirometry: results abnormal (FEV1: 0.99/41%, FVC: 1.97/67%, FEV1/FVC: 50%).    Spirometry consistent with possible restrictive disease. We did provide a bronchodilator challenge with improvement in the FEV1 of 27%. The ratio improved from 50% to 60%.    Salvatore Marvel, MD [facility]

## 2016-07-24 NOTE — Patient Instructions (Signed)
1. Recurrent infections - I think some of your infections are because of your smoking.  - I agree with your plan to quit smoking.  - We will get an immunology workup: immunoglobulin panel (IgG, IgA, IgM), complete blood count with differential, pneumococcal titers  2. Moderate persistent asthma with co-existing COPD - Continue with Dulera two puffs twice daily. - Use Spiriva at noon with Qvar four puffs at noon. - Use the spacer for Qvar and Dulera to make sure if gets down into your lungs.  3. Chronic rhinitis - Allergy testing has been negative in the past.  - We can do skin testing at the next visit to see if there is anything that has developed since then. - Continue with nasal saline and Astelin. - Add nasal Atrovent 1-2 sprays per nostril every 6 hours as needed for runny noses.  4. Return to clinic in three months.

## 2016-07-31 LAB — PNEUMOCOCCAL IM (14 SEROTYPE)
PNEUMO AB TYPE 23 (23F): 3.1 ug/mL (ref >1.3)
PNEUMO AB TYPE 26 (6B): 5.8 ug/mL (ref >1.3)
PNEUMO AB TYPE 3: 1.6 ug/mL (ref >1.3)
PNEUMO AB TYPE 51 (7F): 0.6 ug/mL — AB (ref >1.3)
PNEUMO AB TYPE 57 (19A): 6.8 ug/mL (ref >1.3)
PNEUMO AB TYPE 68 (9V): 1.9 ug/mL (ref >1.3)
PNEUMO AB TYPE 9 (9N): 0.3 ug/mL — AB (ref >1.3)
Pneumo Ab Type 1*: <0.3 ug/mL — ABNORMAL LOW (ref >1.3)
Pneumo Ab Type 12 (12F)*: <0.3 ug/mL — ABNORMAL LOW (ref >1.3)
Pneumo Ab Type 14*: >36.3 ug/mL (ref >1.3)
Pneumo Ab Type 4*: 2.7 ug/mL (ref >1.3)
Pneumo Ab Type 56 (18C)*: 1.4 ug/mL (ref >1.3)
Pneumo Ab Type 8*: 0.3 ug/mL — ABNORMAL LOW (ref >1.3)

## 2016-07-31 LAB — CBC WITH DIFFERENTIAL/PLATELET
BASOS: 1 %
Basophils Absolute: 0.1 10*3/uL (ref 0.0–0.2)
EOS (ABSOLUTE): 0.2 10*3/uL (ref 0.0–0.4)
EOS: 2 %
HEMATOCRIT: 39.6 % (ref 34.0–46.6)
HEMOGLOBIN: 12.9 g/dL (ref 11.1–15.9)
Immature Grans (Abs): 0 10*3/uL (ref 0.0–0.1)
Immature Granulocytes: 0 %
LYMPHS ABS: 1.4 10*3/uL (ref 0.7–3.1)
Lymphs: 20 %
MCH: 29.5 pg (ref 26.6–33.0)
MCHC: 32.6 g/dL (ref 31.5–35.7)
MCV: 90 fL (ref 79–97)
MONOCYTES: 9 %
MONOS ABS: 0.6 10*3/uL (ref 0.1–0.9)
Neutrophils Absolute: 4.6 10*3/uL (ref 1.4–7.0)
Neutrophils: 68 %
Platelets: 324 10*3/uL (ref 150–379)
RBC: 4.38 x10E6/uL (ref 3.77–5.28)
RDW: 14.8 % (ref 12.3–15.4)
WBC: 6.9 10*3/uL (ref 3.4–10.8)

## 2016-07-31 LAB — IGG, IGA, IGM
IGG (IMMUNOGLOBIN G), SERUM: 505 mg/dL — AB (ref 700–1600)
IgA/Immunoglobulin A, Serum: 171 mg/dL (ref 87–352)
IgM (Immunoglobulin M), Srm: 89 mg/dL (ref 26–217)

## 2016-08-04 ENCOUNTER — Encounter: Payer: Self-pay | Admitting: *Deleted

## 2016-08-04 ENCOUNTER — Other Ambulatory Visit: Payer: Self-pay | Admitting: *Deleted

## 2016-08-04 MED ORDER — BECLOMETHASONE DIPROPIONATE 40 MCG/ACT IN AERS: 4.0000 | INHALATION_SPRAY | Freq: Every day | RESPIRATORY_TRACT | 3 refills | Status: DC

## 2016-08-04 MED ORDER — MOMETASONE FURO-FORMOTEROL FUM 200-5 MCG/ACT IN AERO: 2.0000 | INHALATION_SPRAY | Freq: Two times a day (BID) | RESPIRATORY_TRACT | 4 refills | Status: DC

## 2016-08-04 MED ORDER — TIOTROPIUM BROMIDE MONOHYDRATE 18 MCG IN CAPS: 18.0000 ug | ORAL_CAPSULE | Freq: Every day | RESPIRATORY_TRACT | 3 refills | Status: DC

## 2016-08-08 ENCOUNTER — Telehealth: Payer: Self-pay

## 2016-08-08 NOTE — Telephone Encounter (Signed)
Patient called inquiring about letter we sent to contact office regarding labs.  Dr. Ernst Bowler advised to receive Pneumovax then repeat titers in one month.  Patient states she received Pneumovax in April 2016.  Please advise.  Patient has an appointment with Dr. Ernst Bowler on Wednesday and I advised her Dr. Darnell Level would discuss this at her OV.  Patient agrees with plan.

## 2016-08-08 NOTE — Telephone Encounter (Signed)
Reviewed note. I would probably recommend re-vaccinating and checking again. However I will discuss with her when I see her again on Wednesday.  Salvatore Marvel, MD [facility]

## 2016-08-13 ENCOUNTER — Ambulatory Visit: Payer: Medicaid Other | Admitting: Allergy & Immunology

## 2016-08-14 ENCOUNTER — Ambulatory Visit
Admission: RE | Admit: 2016-08-14 | Discharge: 2016-08-14 | Disposition: A | Discharge status: Home / Self Care | Payer: Medicaid Other | Source: Ambulatory Visit | Attending: Allergy & Immunology | Admitting: Allergy & Immunology

## 2016-08-14 ENCOUNTER — Ambulatory Visit (INDEPENDENT_AMBULATORY_CARE_PROVIDER_SITE_OTHER): Payer: Medicaid Other | Admitting: Allergy & Immunology

## 2016-08-14 ENCOUNTER — Encounter: Payer: Self-pay | Admitting: Allergy & Immunology

## 2016-08-14 VITALS — BP 140/78 | HR 74 | Temp 98.3°F | Resp 16 | Ht 62.0 in | Wt 170.0 lb

## 2016-08-14 DIAGNOSIS — J310 Chronic rhinitis: Secondary | ICD-10-CM | POA: Diagnosis not present

## 2016-08-14 DIAGNOSIS — J449 Chronic obstructive pulmonary disease, unspecified: Secondary | ICD-10-CM

## 2016-08-14 DIAGNOSIS — B999 Unspecified infectious disease: Secondary | ICD-10-CM

## 2016-08-14 DIAGNOSIS — J4551 Severe persistent asthma with (acute) exacerbation: Secondary | ICD-10-CM | POA: Diagnosis not present

## 2016-08-14 DIAGNOSIS — S82899D Other fracture of unspecified lower leg, subsequent encounter for closed fracture with routine healing: Secondary | ICD-10-CM

## 2016-08-14 MED ORDER — METHYLPREDNISOLONE ACETATE 80 MG/ML IJ SUSP
80.0000 mg | Freq: Once | INTRAMUSCULAR | Status: AC
  Administered 2016-08-14: 80 mg via INTRAMUSCULAR

## 2016-08-14 MED ORDER — BENZONATATE 100 MG PO CAPS: 100.0000 mg | ORAL_CAPSULE | Freq: Three times a day (TID) | ORAL | 0 refills | Status: DC | PRN

## 2016-08-14 MED ORDER — AMOXICILLIN-POT CLAVULANATE 875-125 MG PO TABS: ORAL_TABLET | ORAL | 0 refills | Status: DC

## 2016-08-14 NOTE — Progress Notes (Signed)
FOLLOW UP  Date of Service/Encounter:  08/14/16   Assessment:   Recurrent infections  Chronic rhinitis  Chronic obstructive pulmonary disease, unspecified COPD, unspecified chronic bronchitis type  Severe persistent asthma, uncomplicated    Asthma Reportables:  Severity: severe persistent  Risk: high due to comorbidities, recurrent steroid courses, history of noncompliance Control: not well controlled  Seasonal Influenza Vaccine: no but encouraged   The CDC recommends patients with persistent asthma between the ages of 19-64 years receive PPSV-23 vaccination, if this patient qualifies, has he/she received 1 dose of PPSV-23 yet? Yes    Plan/Recommendations:    1. Severe persistent asthma with co-existing COPD with acute exacerbation and broken ribs - Continue with Dulera two puffs twice daily. - Use Spiriva at noon with Qvar four puffs at noon. - Use the spacer for Qvar and Dulera to make sure if gets down into your lungs. - IM steroid provided in clinic today. - Unfortunately, she does not quality for Xolair since she did not have sensitization to any allergens on her last testing. - We will get an IgE level today in case future testing shows sensitizations. - She does have an AEC of 200 from 2 weeks ago, therefore she does qualify for Nucala. - We'll send a message to a biologics coordinator to get Nucala approved.  - Refill history was notable for noncompliance prior to August 2017. - Chest x-ray ordered today and was negative for any acute cardiopulmonary process. - She does have multiple right lateral rib fractures that are healing as well as left rib fractures. - Rib fractures were noted as far back as January 2017. - Discussed with patient and she denies trauma; she reports that these are from coughing.  - [name] denies any physical trauma, including intimate partner violence.  - We will consider obtaining full pulmonary function testing at the next visit.    2. Recurrent infections - I will recheck your IgG level as well since we are getting blood today anyway. - I recommended that you get Prevnar vaccination at your primary care physician's office.  - Prevnar is a protein conjugated pneumococcal vaccination and can be given despite Pneumovax being given earlier this year. - We will get repeat testing for Pneumococcal titers once month after the vaccination. - I continue to feel that her recurrent infections are likely secondary to her smoking. - I did praise her for her indication to smoking cessation. - Her infections are isolated to the sinopulmonary tract, therefore an immunodeficiency is less likely. - Her infectious history is otherwise unremarkable aside from recurrent sinusitis and pneumonia.  3. Chronic rhinitis with acute sinusitis - Allergy testing has been negative in the past, so we cannot mix up allergy shots at this point. - We would like to do skin testing at the next appointment, so stay off of antihistamines for three days before the next appointment.  - If you have developed allergies, we can talk about doing allergy shots.  - Continue with nasal saline and Astelin as well as nasal Atrovent 1-2 sprays per nostril every 6 hours as needed for runny nose. - Start Augmentin 875mg  one tablet twice daily for 14 days.  4. Return to clinic in three months.       Subjective:   [name] is a 50 y.o. female presenting today for follow up of  Chief Complaint  Patient presents with  . Cough    congestion  . Conjunctivitis  .  [name]  has a history of the following: Patient Active Problem List   Diagnosis Date Noted  . Hyponatremia 04/14/2016  . Opiate withdrawal ([place]) 04/14/2016  . MDD (major depressive disorder), recurrent severe, without psychosis ([place]) 12/22/2015  . Leg edema 12/13/2015  . Acute CHF (congestive heart failure) ([place]) 12/13/2015  . Respiratory failure ([place]) 12/13/2015  . IBS (irritable  bowel syndrome) 12/13/2015  . Abdominal pain 12/13/2015  . Suicidal ideation 12/13/2015  . CAP (community acquired pneumonia) 12/13/2015  . SOB (shortness of breath) 12/13/2015  . Dysuria 12/13/2015  . COPD with acute exacerbation ([place])   . Back pain   . Hypertension   . Arthritis   . Bipolar disorder ([place])   . Depression   . Essential hypertension   . Encephalopathy acute 12/03/2015  . Leukocytosis 12/03/2015  . Sinus tachycardia ([place]) 11/24/2015  . Chronic narcotic dependence ([place]) 11/22/2015  . Closed fracture of multiple ribs of right side 11/22/2015  . Hypercalcemia 11/22/2015  . Chronic hyponatremia 03/07/2015  . Atypical chest pain 01/09/2015  . Centrilobular emphysema ([place]) 01/09/2015  . Cigarette nicotine dependence without complication [id number]  . Fibromyalgia syndrome 01/09/2015  . Mixed hyperlipidemia 01/09/2015  . Chronic low back pain 03/06/2014    History obtained from: chart review and Patient.  [name] was referred by Tanya Spanish, MD.     [name] is a 50 y.o. female presenting for a follow up visit for sinus testing. [name] was last seen approximately 3 weeks ago. At that time, we did a limited immunology workup due to her recurrent sinus infections. Her immunoglobulin panel was normal aside from a slightly low IgG at 505 Her pneumococcal titers were protective to 9 out of 14 serotypes, although some titers were not robust. I recommended that she get a repeat Pneumovax and retest in 1 month after the immunization. She has a history of underlying COPD asthma overlap syndrome. She is on Dilaudid 2 puffs twice a day as well as Spiriva and Qvar 4 puffs at noon. At the last visit, I recommended that she is her spacer to ensure adequate medication delivery. She does have chronic nonallergic rhinitis. Allergy testing has been negative  In the past, and I recommended that we reduce skin testing at the next visit to see if these hospitalizations have emerged. I  recommended continuing with nasal saline and Astelin as well as nasal Atrovent every 6 hours as needed for rhinorrhea.   Since last visit, she has remained stable, which is to say she is not doing very well. Today she reports that she is having ear pain and throat pain. She went to the pharmacy to buy severe sinus headache medication. This has not helped. She has been taking it for "quite a while". She started cough medicine for congestion which has not helped. She is having gum pain and facial pain. She endorses chest pain and back pain. She is using her albuterol nebulizer, which does not help at all. She does not run fevers even when she has pneumonia. Her last CXR was in July 2017 and was normal.  [name] was on amoxicillin QID for seven days for a dental infection. She was also given amoxicillin several months ago for a ear infection. This required a second round of antibiotics. She does not remember if she was on Augmentin. She has been on prednisone around 2 times this past calendar year. She received the Pneumovax vaccination in April 2017.  Otherwise, there have been no changes to the past medical  history, surgical history, family history, or social history.   Refill history from pharmacy: [facility]: September 2017, August 2017 Qvar: September 2017 Spiriva: September 2017, July 2017  Xyzal: September 2017 Pro Air: September 2017, July 2017, June 2017 Atrovent nasal spray: August 2017 Astepro nasal spray: June 2017 Loratadine: June 2017 (x3) Prednisone: July 2017  Review of Systems: a 14-point review of systems is pertinent for what is mentioned in HPI.  Otherwise, all other systems were negative. Constitutional: Negative for fever, positive for fatigue, positive for generalized pain, otherwise negative other than that listed in the HPI Eyes: Negative for photophobia, negative for pain with eye movement, otherwise negative other than that listed in the HPI Ears, nose, mouth, throat, and  face: Positives as above in the history of present illness, otherwise negative other than that listed in the HPI Respiratory: Positives as in the history of present illness above, otherwise negative other than that listed in the HPI Cardiovascular: Negative for syncope, positive for chest pain, otherwise negative other than that listed in the HPI Gastrointestinal: Negative for diarrhea, negative for vomiting, otherwise negative other than that listed in the HPI Genitourinary: negative other than that listed in the HPI Integument: negative other than that listed in the HPI Hematologic: Negative for bruising, negative for bleeding, otherwise negative other than that listed in the HPI Musculoskeletal: Positive for generalized pain, otherwise negative other than that listed in the HPI Neurological: negative other than that listed in the HPI Allergy/Immunologic: Positives as in the history of present illness above, otherwise negative other than that listed in the HPI    Objective:   Blood pressure 140/78, pulse 74, temperature 98.3 F (36.8 C), temperature source Oral, resp. rate 16, height 5\' 2"  (1.575 m), weight 170 lb (77.1 kg). Body mass index is 31.09 kg/m.   Physical Exam:  General: Alert, interactive, in no acute distress. Very depressed, although laughing when I first entered the room. Cooperative with the exam. HEENT: TMs pearly gray, turbinates edematous with thick discharge, post-pharynx erythematous. Neck: Supple without thyromegaly. Adenopathy: no enlarged lymph nodes appreciated in the anterior cervical, occipital, axillary, epitrochlear, inguinal, or popliteal regions Lungs: Decreased breath sounds with expiratory wheezing bilaterally. No crackles noted. Decreased breath sounds at bases. Increased work of breathing. CV: Normal S1, S2 without murmurs. Capillary refill <2 seconds.  Abdomen: Nondistended, nontender. No guarding or rebound tenderness. Skin: Warm and dry, without  lesions or rashes. Extremities:  No clubbing, cyanosis or edema. Neuro:   Grossly intact.   Diagnostic studies:  Spirometry: results abnormal (FEV1: 1.02/42%, FVC: 1.79/62%, FEV1/FVC: 57%).    Spirometry consistent with moderate obstructive disease. We did give her bronchodilator nebulizer treatment in clinic today. Unfortunately, she did not have significant improvement. Her FVC increased 6% and her FEV1 increased 4%. Physical exam following the treatment noted continued expiratory wheezes in the bilateral upper lung fields.  Allergy Studies: None    Salvatore Marvel, MD [facility]

## 2016-08-14 NOTE — Patient Instructions (Addendum)
1. Severe persistent asthma with co-existing COPD with acute exacerbation - Continue with Dulera two puffs twice daily. - Use Spiriva at noon with Qvar four puffs at noon. - Use the spacer for Qvar and Dulera to make sure if gets down into your lungs. - We will give you a prednisone burst today to help you get through your current attack. - We are going to get some lab work to see if you can qualify for any biological therapies for asthma.   2. Recurrent infections - I will recheck your IgG level as well since we are getting blood. - I recommended that you get Prevnar vaccination at your primary care physician's office. - We will get repeat testing for Pneumococcal titers once month after the vaccination.  3. Chronic rhinitis with acute sinusitis - Allergy testing has been negative in the past, so we cannot mix up allergy shots at this point. - We would like to do skin testing at the next appointment, so stay off of antihistamines for three days before the next appointment.  - If you have developed allergies, we can talk about doing allergy shots.  - Continue with nasal saline and Astelin as well as nasal Atrovent 1-2 sprays per nostril every 6 hours as needed for runny nose. - Start Augmentin 875mg  one tablet twice daily for 14 days.  4. Return to clinic in three months.   It was a pleasure to see you again today! Have fun on your camping trip!   Websites that have reliable patient information: 1. American Academy of Asthma, Allergy, and Immunology: www.aaaai.org 2. Food Allergy Research and Education (FARE): foodallergy.org 3. Mothers of Asthmatics: http://www.asthmacommunitynetwork.org 4. American College of Allergy, Asthma, and Immunology: www.acaai.org

## 2016-08-17 ENCOUNTER — Other Ambulatory Visit: Payer: Self-pay | Admitting: Allergy & Immunology

## 2016-08-18 ENCOUNTER — Telehealth: Payer: Self-pay | Admitting: Allergy & Immunology

## 2016-08-18 NOTE — Telephone Encounter (Signed)
Can you please send in a prescription for Albuterol nebulizer and something stronger for her cough.  She uses [facility] on [address] in [place] ([place])

## 2016-08-19 ENCOUNTER — Other Ambulatory Visit: Payer: Self-pay

## 2016-08-19 MED ORDER — ALBUTEROL SULFATE (2.5 MG/3ML) 0.083% IN NEBU: 2.5000 mg | INHALATION_SOLUTION | Freq: Four times a day (QID) | RESPIRATORY_TRACT | 1 refills | Status: DC | PRN

## 2016-08-19 NOTE — Telephone Encounter (Signed)
Sent in refill of albuterol and lm for pt to call us back about something stronger for cough

## 2016-08-19 NOTE — Telephone Encounter (Signed)
Spoke with pt she will call her pcp about the stronger cough med

## 2016-08-29 ENCOUNTER — Ambulatory Visit (INDEPENDENT_AMBULATORY_CARE_PROVIDER_SITE_OTHER): Payer: Medicaid Other | Admitting: *Deleted

## 2016-08-29 DIAGNOSIS — J4550 Severe persistent asthma, uncomplicated: Secondary | ICD-10-CM | POA: Diagnosis not present

## 2016-08-29 MED ORDER — MEPOLIZUMAB 100 MG ~~LOC~~ SOLR
100.0000 mg | SUBCUTANEOUS | Status: DC
  Administered 2016-08-29 – 2016-12-01 (×4): 100 mg via SUBCUTANEOUS

## 2016-08-29 NOTE — Progress Notes (Signed)
Immunotherapy   Patient Details  Name: [name] MRN: [id number] Date of Birth: [birth year]  08/29/2016  [name] started Nucala injections for asthma.   Frequency:every 28 days.  Epi-Pen:Epi-Pen Available  Consent signed and patient instructions given. Patient waited for 60 minutes in office and did not have any problems.    [name] 08/29/2016, 11:04 AM

## 2016-09-26 ENCOUNTER — Ambulatory Visit (INDEPENDENT_AMBULATORY_CARE_PROVIDER_SITE_OTHER): Payer: Medicaid Other

## 2016-09-26 DIAGNOSIS — J4550 Severe persistent asthma, uncomplicated: Secondary | ICD-10-CM | POA: Diagnosis not present

## 2016-10-06 ENCOUNTER — Other Ambulatory Visit: Payer: Self-pay | Admitting: *Deleted

## 2016-10-06 MED ORDER — TIOTROPIUM BROMIDE MONOHYDRATE 18 MCG IN CAPS
18.0000 ug | ORAL_CAPSULE | Freq: Every day | RESPIRATORY_TRACT | 3 refills | Status: DC
Start: 2016-10-06 — End: 2016-12-01

## 2016-10-24 ENCOUNTER — Ambulatory Visit: Payer: Medicaid Other

## 2016-10-31 ENCOUNTER — Ambulatory Visit: Payer: Medicaid Other

## 2016-10-31 ENCOUNTER — Ambulatory Visit (INDEPENDENT_AMBULATORY_CARE_PROVIDER_SITE_OTHER): Payer: Medicaid Other

## 2016-10-31 DIAGNOSIS — J4550 Severe persistent asthma, uncomplicated: Secondary | ICD-10-CM | POA: Diagnosis not present

## 2016-11-06 ENCOUNTER — Other Ambulatory Visit: Payer: Self-pay | Admitting: *Deleted

## 2016-11-06 MED ORDER — ALBUTEROL SULFATE HFA 108 (90 BASE) MCG/ACT IN AERS: 2.0000 | INHALATION_SPRAY | Freq: Four times a day (QID) | RESPIRATORY_TRACT | 3 refills | Status: DC | PRN

## 2016-11-19 ENCOUNTER — Ambulatory Visit: Payer: Medicaid Other | Admitting: Allergy & Immunology

## 2016-11-20 ENCOUNTER — Ambulatory Visit: Payer: Medicaid Other | Admitting: Allergy & Immunology

## 2016-12-01 ENCOUNTER — Ambulatory Visit (INDEPENDENT_AMBULATORY_CARE_PROVIDER_SITE_OTHER): Payer: Medicaid Other | Admitting: Allergy

## 2016-12-01 ENCOUNTER — Ambulatory Visit: Payer: Medicaid Other

## 2016-12-01 ENCOUNTER — Encounter: Payer: Self-pay | Admitting: Allergy

## 2016-12-01 VITALS — BP 132/80 | HR 76 | Temp 97.6°F | Resp 14 | Ht 61.5 in | Wt 165.8 lb

## 2016-12-01 DIAGNOSIS — J310 Chronic rhinitis: Secondary | ICD-10-CM | POA: Diagnosis not present

## 2016-12-01 DIAGNOSIS — J449 Chronic obstructive pulmonary disease, unspecified: Secondary | ICD-10-CM

## 2016-12-01 DIAGNOSIS — J4550 Severe persistent asthma, uncomplicated: Secondary | ICD-10-CM

## 2016-12-01 DIAGNOSIS — B999 Unspecified infectious disease: Secondary | ICD-10-CM

## 2016-12-01 MED ORDER — ALBUTEROL SULFATE HFA 108 (90 BASE) MCG/ACT IN AERS: 2.0000 | INHALATION_SPRAY | Freq: Four times a day (QID) | RESPIRATORY_TRACT | 5 refills | Status: DC | PRN

## 2016-12-01 MED ORDER — IPRATROPIUM BROMIDE 0.03 % NA SOLN: NASAL | 5 refills | Status: DC

## 2016-12-01 MED ORDER — LORATADINE 10 MG PO TABS: 10.0000 mg | ORAL_TABLET | Freq: Every morning | ORAL | 5 refills | Status: DC

## 2016-12-01 MED ORDER — TIOTROPIUM BROMIDE MONOHYDRATE 18 MCG IN CAPS: 18.0000 ug | ORAL_CAPSULE | Freq: Every day | RESPIRATORY_TRACT | 3 refills | Status: DC

## 2016-12-01 MED ORDER — AZELASTINE HCL 0.15 % NA SOLN: 1.0000 | Freq: Two times a day (BID) | NASAL | 5 refills | Status: DC

## 2016-12-01 NOTE — Patient Instructions (Addendum)
Severe persistent asthma with co-existing COPD  - Continue with Dulera two puffs twice daily. - Use Spiriva at noon with Qvar four puffs at noon. - Use the spacer for Qvar and Dulera to make sure if gets down into your lungs. - Continue monthly Nucala injections  Recurrent infections - Recommended that you get Prevnar vaccination at your primary care physician's office. - We will get repeat testing for Pneumococcal titers about a month after the vaccination.  Call and let us know when you get this vaccine.   Chronic rhinitis - Continue with nasal saline and Astelin as well as nasal Atrovent 1-2 sprays per nostril every 6 hours as needed for runny nose. - Use Claritin 10mg  daily as needed  Return to clinic in three months with Dr. Ernst Bowler

## 2016-12-01 NOTE — Progress Notes (Signed)
Follow-up Note  RE: [name] MRN: [id number] DOB: 04-Jan-1966 Date of Office Visit: 12/01/2016   History of present illness: [name] is a 51 y.o. female presenting today for follow-up of severe persistent asthma. She was last seen in the office on 08/14/2016 by Dr. Ernst Bowler at which time she had an acute exacerbation.  She was given a steroid injection in the office and was advised to continue use of her Dulera twice a day, Spiriva and Qvar midday. She has since been started on Nucala has now had 3 monthly injections.  She was also treated for acute sinusitis with Augmentin for 14 days.    She feels better since starting on Nucala.   She reports using albuterol 6 times a week which is a improvement from needing to use it 10 times a day.  She is able to sleep through the night now which she wasn't before.  Her daughter who presents with her today states she is doing much better.  She has not been on any steroids since last visit.  She reports having an URI where she was coughing up "green" sputum and was prescribed antibiotic x 5 days (doesn't remember which antibiotic).  She improved from this.  She did have some nighttime awakenings during this illness but when she is well she denies nighttime awakenings.  She is coughing less thus she has less rib cage pain. She had a recent chest x-ray evaluating her healing rib fractures late last week.  She would like a prescription for Claritin.  She has been taking Xyzal but does not feel works for her but she does state Claritin works for her.   She also for her allergies uses Astelin nasal spray.  She has not been able to receive Prevnar from her PCP at this time.    She is moving in the next week to a new home and will not be around any cats anymore.   She reports the cats she is around now bother her allergy and asthma symptoms.       Review of systems: Review of Systems  Constitutional: Negative for chills, fever and malaise/fatigue.   HENT: Positive for congestion. Negative for ear discharge, ear pain, nosebleeds, sinus pain and sore throat.   Eyes: Negative for discharge and redness.  Respiratory: Positive for cough, shortness of breath and wheezing.   Cardiovascular: Negative for chest pain.  Gastrointestinal: Negative for abdominal pain, nausea and vomiting.  Skin: Negative for itching and rash.    All other systems negative unless noted above in HPI  Past medical/social/surgical/family history have been reviewed and are unchanged unless specifically indicated below.  No changes  Medication List: Allergies as of 12/01/2016      Reactions   Cymbalta [duloxetine Hcl] Other (See Comments)   Mood changes...mean   Nyquil Multi-symptom [pseudoeph-doxylamine-dm-apap] Hives   Paxil [paroxetine Hcl] Other (See Comments)   Mean and aggresive   Promethazine Itching   Tramadol Itching   Wellbutrin [bupropion] Other (See Comments)   Rapid heartrate   Zoloft [sertraline] Other (See Comments)   Anger.      Medication List       Accurate as of 12/01/16 12:06 PM. Always use your most recent med list.          AEROCHAMBER PLUS FLO-VU MEDIUM Misc Use with metered dose inhaler   albuterol (2.5 MG/3ML) 0.083% nebulizer solution Commonly known as:  PROVENTIL Take 3 mLs (2.5 mg total) by nebulization every 6 (  six) hours as needed for wheezing or shortness of breath.   albuterol 108 (90 Base) MCG/ACT inhaler Commonly known as:  PROAIR HFA Inhale 2 puffs into the lungs every 6 (six) hours as needed for wheezing or shortness of breath.   Azelastine HCl 0.15 % Soln Place 1 spray into both nostrils 2 (two) times daily.   beclomethasone 40 MCG/ACT inhaler Commonly known as:  QVAR Inhale 4 puffs into the lungs daily at 12 noon.   benzonatate 100 MG capsule Commonly known as:  TESSALON PERLES Take 1 capsule (100 mg total) by mouth 3 (three) times daily as needed for cough.   calcium carbonate 500 MG chewable  tablet Commonly known as:  TUMS - dosed in mg elemental calcium Chew 1 tablet (200 mg of elemental calcium total) by mouth 2 (two) times daily.   clonazePAM 1 MG tablet Commonly known as:  KLONOPIN Take by mouth.   ergocalciferol 50000 units capsule Commonly known as:  VITAMIN D2 Take 50,000 Units by mouth once a week. Saturday   escitalopram 20 MG tablet Commonly known as:  LEXAPRO Take 20 mg by mouth 2 (two) times daily.   gabapentin 400 MG capsule Commonly known as:  NEURONTIN Take 800 mg by mouth 3 (three) times daily. 800mg  in the morning, 800mg  in the evening, and 1600mg  at bedtime   ipratropium 0.03 % nasal spray Commonly known as:  ATROVENT 1-2 sprays every 6 hours as needed   levocetirizine 5 MG tablet Commonly known as:  XYZAL Take 1 tablet (5 mg total) by mouth every evening.   loratadine 10 MG tablet Commonly known as:  CLARITIN Take 10 mg by mouth every morning.   methocarbamol 750 MG tablet Commonly known as:  ROBAXIN Take 1,500 mg by mouth 3 (three) times daily.   metoprolol tartrate 25 MG tablet Commonly known as:  LOPRESSOR Take 25 mg by mouth 2 (two) times daily. Reported on 02/21/2016   mometasone-formoterol 200-5 MCG/ACT Aero Commonly known as:  DULERA Inhale 2 puffs into the lungs 2 (two) times daily.   ondansetron 4 MG tablet Commonly known as:  ZOFRAN Take 4 mg by mouth every 8 (eight) hours as needed for nausea or vomiting.   oxyCODONE 15 MG immediate release tablet Commonly known as:  ROXICODONE Take 15 mg by mouth every 4 (four) hours as needed for pain.   QUEtiapine 50 MG tablet Commonly known as:  SEROQUEL Take 50 mg by mouth at bedtime. Reported on 05/14/2016   rosuvastatin 10 MG tablet Commonly known as:  CRESTOR Take 10 mg by mouth daily. Reported on 05/14/2016   tiotropium 18 MCG inhalation capsule Commonly known as:  SPIRIVA HANDIHALER Place 1 capsule (18 mcg total) into inhaler and inhale daily.       Known medication  allergies: Allergies  Allergen Reactions  . Cymbalta [Duloxetine Hcl] Other (See Comments)    Mood changes...mean  . Nyquil Multi-Symptom [Pseudoeph-Doxylamine-Dm-Apap] Hives  . Paxil [Paroxetine Hcl] Other (See Comments)    Mean and aggresive  . Promethazine Itching  . Tramadol Itching  . Wellbutrin [Bupropion] Other (See Comments)    Rapid heartrate  . Zoloft [Sertraline] Other (See Comments)    Anger.     Physical examination: Blood pressure 132/80, pulse 76, temperature 97.6 F (36.4 C), temperature source Oral, resp. rate 14, height 5' 1.5" (1.562 m), weight 165 lb 12.8 oz (75.2 kg), SpO2 97 %.  General: Alert, interactive, in no acute distress. HEENT: TMs pearly gray, turbinates mildly edematous  without discharge, post-pharynx non erythematous. Neck: Supple without lymphadenopathy. Lungs: Decreased breath sounds throughout with end expiratory wheezing. {no increased work of breathing. CV: Normal S1, S2 without murmurs. Abdomen: Nondistended, nontender. Skin: Warm and dry, without lesions or rashes. Extremities:  No clubbing, cyanosis or edema. Neuro:   Grossly intact.  Diagnositics/Labs: Labs: Alpha 1 antitrypsin, IgE and IgG have not been collected since ordered last visit  Imaging:  CXR  08/14/16 personally reviewed with no infiltrate/opacity.  Healing rib fractures noted.   Official read - Normal cardiac and mediastinal contours. No consolidative pulmonary opacities. No pleural effusion pneumothorax. Multiple healing right lateral rib fractures. Additionally there are healing left lateral rib fractures. Thoracic spine degenerative changes.   IMPRESSION:  No acute cardiopulmonary process.  Spirometry: FEV1: 0.77L  32%, FVC: 1.39L  48%, ratio consistent with Severe restriction with moderate obstruction  Assessment and plan:   Severe persistent asthma with co-existing COPD, improved - Continue with Dulera two puffs twice daily. - Use Spiriva at noon with Qvar four  puffs at noon. - Use the spacer for Qvar and Dulera to make sure if gets down into your lungs. - Continue monthly Nucala injections - Albuterol as needed  Recurrent infections - Recommended that you get Prevnar vaccination at your primary care physician's office. - We will get repeat testing for Pneumococcal titers about a month after the vaccination.  Call and let us know when you get this vaccine.   Chronic rhinitis - Continue with nasal saline and Astelin as well as nasal Atrovent 1-2 sprays per nostril every 6 hours as needed for runny nose. - Use Claritin 10mg  daily as needed  Return to clinic in three months with Dr. Ernst Bowler  I appreciate the opportunity to take part in Avelina's care. Please do not hesitate to contact me with questions.  Sincerely,   Prudy Feeler, MD Allergy/Immunology [facility]

## 2016-12-12 ENCOUNTER — Emergency Department (HOSPITAL_COMMUNITY): Payer: Medicaid Other

## 2016-12-12 ENCOUNTER — Encounter (HOSPITAL_COMMUNITY): Payer: Self-pay | Admitting: Emergency Medicine

## 2016-12-12 ENCOUNTER — Inpatient Hospital Stay (HOSPITAL_COMMUNITY): Payer: Medicaid Other

## 2016-12-12 ENCOUNTER — Inpatient Hospital Stay (HOSPITAL_COMMUNITY)
Admission: EM | Admit: 2016-12-12 | Discharge: 2017-01-12 | DRG: 853 | Disposition: A | Discharge status: Skilled Nursing Facility | Payer: Medicaid Other | Attending: Internal Medicine | Admitting: Internal Medicine

## 2016-12-12 DIAGNOSIS — J9811 Atelectasis: Secondary | ICD-10-CM | POA: Diagnosis not present

## 2016-12-12 DIAGNOSIS — J45909 Unspecified asthma, uncomplicated: Secondary | ICD-10-CM | POA: Diagnosis not present

## 2016-12-12 DIAGNOSIS — Z4659 Encounter for fitting and adjustment of other gastrointestinal appliance and device: Secondary | ICD-10-CM

## 2016-12-12 DIAGNOSIS — I248 Other forms of acute ischemic heart disease: Secondary | ICD-10-CM | POA: Diagnosis present

## 2016-12-12 DIAGNOSIS — E782 Mixed hyperlipidemia: Secondary | ICD-10-CM | POA: Diagnosis present

## 2016-12-12 DIAGNOSIS — F1120 Opioid dependence, uncomplicated: Secondary | ICD-10-CM | POA: Diagnosis present

## 2016-12-12 DIAGNOSIS — G9341 Metabolic encephalopathy: Secondary | ICD-10-CM | POA: Diagnosis present

## 2016-12-12 DIAGNOSIS — E870 Hyperosmolality and hypernatremia: Secondary | ICD-10-CM | POA: Diagnosis not present

## 2016-12-12 DIAGNOSIS — Z9049 Acquired absence of other specified parts of digestive tract: Secondary | ICD-10-CM

## 2016-12-12 DIAGNOSIS — B3789 Other sites of candidiasis: Secondary | ICD-10-CM | POA: Diagnosis present

## 2016-12-12 DIAGNOSIS — K589 Irritable bowel syndrome without diarrhea: Secondary | ICD-10-CM | POA: Diagnosis present

## 2016-12-12 DIAGNOSIS — N179 Acute kidney failure, unspecified: Secondary | ICD-10-CM

## 2016-12-12 DIAGNOSIS — K567 Ileus, unspecified: Secondary | ICD-10-CM | POA: Diagnosis not present

## 2016-12-12 DIAGNOSIS — J939 Pneumothorax, unspecified: Secondary | ICD-10-CM | POA: Diagnosis not present

## 2016-12-12 DIAGNOSIS — J9382 Other air leak: Secondary | ICD-10-CM | POA: Diagnosis not present

## 2016-12-12 DIAGNOSIS — G9340 Encephalopathy, unspecified: Secondary | ICD-10-CM | POA: Diagnosis present

## 2016-12-12 DIAGNOSIS — B957 Other staphylococcus as the cause of diseases classified elsewhere: Secondary | ICD-10-CM | POA: Diagnosis not present

## 2016-12-12 DIAGNOSIS — B9562 Methicillin resistant Staphylococcus aureus infection as the cause of diseases classified elsewhere: Secondary | ICD-10-CM | POA: Diagnosis present

## 2016-12-12 DIAGNOSIS — I639 Cerebral infarction, unspecified: Secondary | ICD-10-CM | POA: Diagnosis present

## 2016-12-12 DIAGNOSIS — E43 Unspecified severe protein-calorie malnutrition: Secondary | ICD-10-CM | POA: Diagnosis not present

## 2016-12-12 DIAGNOSIS — G8929 Other chronic pain: Secondary | ICD-10-CM | POA: Diagnosis present

## 2016-12-12 DIAGNOSIS — F17210 Nicotine dependence, cigarettes, uncomplicated: Secondary | ICD-10-CM | POA: Diagnosis present

## 2016-12-12 DIAGNOSIS — Z8249 Family history of ischemic heart disease and other diseases of the circulatory system: Secondary | ICD-10-CM | POA: Diagnosis not present

## 2016-12-12 DIAGNOSIS — R451 Restlessness and agitation: Secondary | ICD-10-CM | POA: Diagnosis not present

## 2016-12-12 DIAGNOSIS — J9600 Acute respiratory failure, unspecified whether with hypoxia or hypercapnia: Secondary | ICD-10-CM | POA: Diagnosis not present

## 2016-12-12 DIAGNOSIS — I509 Heart failure, unspecified: Secondary | ICD-10-CM | POA: Diagnosis present

## 2016-12-12 DIAGNOSIS — J440 Chronic obstructive pulmonary disease with acute lower respiratory infection: Secondary | ICD-10-CM | POA: Diagnosis present

## 2016-12-12 DIAGNOSIS — J851 Abscess of lung with pneumonia: Secondary | ICD-10-CM | POA: Diagnosis present

## 2016-12-12 DIAGNOSIS — E871 Hypo-osmolality and hyponatremia: Secondary | ICD-10-CM | POA: Diagnosis present

## 2016-12-12 DIAGNOSIS — R109 Unspecified abdominal pain: Secondary | ICD-10-CM

## 2016-12-12 DIAGNOSIS — R7401 Elevation of levels of liver transaminase levels: Secondary | ICD-10-CM | POA: Diagnosis present

## 2016-12-12 DIAGNOSIS — Z978 Presence of other specified devices: Secondary | ICD-10-CM

## 2016-12-12 DIAGNOSIS — R0682 Tachypnea, not elsewhere classified: Secondary | ICD-10-CM

## 2016-12-12 DIAGNOSIS — A411 Sepsis due to other specified staphylococcus: Principal | ICD-10-CM | POA: Diagnosis present

## 2016-12-12 DIAGNOSIS — D649 Anemia, unspecified: Secondary | ICD-10-CM | POA: Diagnosis not present

## 2016-12-12 DIAGNOSIS — R1313 Dysphagia, pharyngeal phase: Secondary | ICD-10-CM | POA: Diagnosis present

## 2016-12-12 DIAGNOSIS — J9601 Acute respiratory failure with hypoxia: Secondary | ICD-10-CM | POA: Diagnosis present

## 2016-12-12 DIAGNOSIS — E873 Alkalosis: Secondary | ICD-10-CM | POA: Diagnosis not present

## 2016-12-12 DIAGNOSIS — J302 Other seasonal allergic rhinitis: Secondary | ICD-10-CM | POA: Diagnosis present

## 2016-12-12 DIAGNOSIS — J441 Chronic obstructive pulmonary disease with (acute) exacerbation: Secondary | ICD-10-CM | POA: Diagnosis present

## 2016-12-12 DIAGNOSIS — J4551 Severe persistent asthma with (acute) exacerbation: Secondary | ICD-10-CM | POA: Diagnosis present

## 2016-12-12 DIAGNOSIS — J13 Pneumonia due to Streptococcus pneumoniae: Secondary | ICD-10-CM | POA: Diagnosis present

## 2016-12-12 DIAGNOSIS — E872 Acidosis: Secondary | ICD-10-CM | POA: Diagnosis not present

## 2016-12-12 DIAGNOSIS — J189 Pneumonia, unspecified organism: Secondary | ICD-10-CM | POA: Diagnosis not present

## 2016-12-12 DIAGNOSIS — B379 Candidiasis, unspecified: Secondary | ICD-10-CM

## 2016-12-12 DIAGNOSIS — D638 Anemia in other chronic diseases classified elsewhere: Secondary | ICD-10-CM | POA: Diagnosis present

## 2016-12-12 DIAGNOSIS — Z9889 Other specified postprocedural states: Secondary | ICD-10-CM

## 2016-12-12 DIAGNOSIS — K7200 Acute and subacute hepatic failure without coma: Secondary | ICD-10-CM | POA: Diagnosis present

## 2016-12-12 DIAGNOSIS — A0472 Enterocolitis due to Clostridium difficile, not specified as recurrent: Secondary | ICD-10-CM | POA: Diagnosis not present

## 2016-12-12 DIAGNOSIS — J948 Other specified pleural conditions: Secondary | ICD-10-CM | POA: Diagnosis present

## 2016-12-12 DIAGNOSIS — J181 Lobar pneumonia, unspecified organism: Secondary | ICD-10-CM | POA: Diagnosis not present

## 2016-12-12 DIAGNOSIS — R0902 Hypoxemia: Secondary | ICD-10-CM

## 2016-12-12 DIAGNOSIS — L8990 Pressure ulcer of unspecified site, unspecified stage: Secondary | ICD-10-CM | POA: Diagnosis not present

## 2016-12-12 DIAGNOSIS — J9819 Other pulmonary collapse: Secondary | ICD-10-CM

## 2016-12-12 DIAGNOSIS — Z9689 Presence of other specified functional implants: Secondary | ICD-10-CM | POA: Diagnosis not present

## 2016-12-12 DIAGNOSIS — I6789 Other cerebrovascular disease: Secondary | ICD-10-CM | POA: Diagnosis not present

## 2016-12-12 DIAGNOSIS — E669 Obesity, unspecified: Secondary | ICD-10-CM | POA: Diagnosis present

## 2016-12-12 DIAGNOSIS — R000 Tachycardia, unspecified: Secondary | ICD-10-CM | POA: Diagnosis not present

## 2016-12-12 DIAGNOSIS — J869 Pyothorax without fistula: Secondary | ICD-10-CM | POA: Diagnosis present

## 2016-12-12 DIAGNOSIS — Z8673 Personal history of transient ischemic attack (TIA), and cerebral infarction without residual deficits: Secondary | ICD-10-CM

## 2016-12-12 DIAGNOSIS — Z6829 Body mass index (BMI) 29.0-29.9, adult: Secondary | ICD-10-CM

## 2016-12-12 DIAGNOSIS — R140 Abdominal distension (gaseous): Secondary | ICD-10-CM

## 2016-12-12 DIAGNOSIS — Z888 Allergy status to other drugs, medicaments and biological substances status: Secondary | ICD-10-CM

## 2016-12-12 DIAGNOSIS — F419 Anxiety disorder, unspecified: Secondary | ICD-10-CM | POA: Diagnosis present

## 2016-12-12 DIAGNOSIS — R0602 Shortness of breath: Secondary | ICD-10-CM

## 2016-12-12 DIAGNOSIS — R740 Nonspecific elevation of levels of transaminase and lactic acid dehydrogenase [LDH]: Secondary | ICD-10-CM | POA: Diagnosis not present

## 2016-12-12 DIAGNOSIS — T380X5A Adverse effect of glucocorticoids and synthetic analogues, initial encounter: Secondary | ICD-10-CM | POA: Diagnosis not present

## 2016-12-12 DIAGNOSIS — R4689 Other symptoms and signs involving appearance and behavior: Secondary | ICD-10-CM

## 2016-12-12 DIAGNOSIS — Z0189 Encounter for other specified special examinations: Secondary | ICD-10-CM

## 2016-12-12 DIAGNOSIS — M545 Low back pain, unspecified: Secondary | ICD-10-CM | POA: Diagnosis present

## 2016-12-12 DIAGNOSIS — I10 Essential (primary) hypertension: Secondary | ICD-10-CM | POA: Diagnosis present

## 2016-12-12 DIAGNOSIS — F332 Major depressive disorder, recurrent severe without psychotic features: Secondary | ICD-10-CM | POA: Diagnosis not present

## 2016-12-12 DIAGNOSIS — Z823 Family history of stroke: Secondary | ICD-10-CM | POA: Diagnosis not present

## 2016-12-12 DIAGNOSIS — G253 Myoclonus: Secondary | ICD-10-CM | POA: Diagnosis not present

## 2016-12-12 DIAGNOSIS — R739 Hyperglycemia, unspecified: Secondary | ICD-10-CM | POA: Diagnosis not present

## 2016-12-12 DIAGNOSIS — R197 Diarrhea, unspecified: Secondary | ICD-10-CM | POA: Diagnosis not present

## 2016-12-12 DIAGNOSIS — J984 Other disorders of lung: Secondary | ICD-10-CM

## 2016-12-12 DIAGNOSIS — Z9911 Dependence on respirator [ventilator] status: Secondary | ICD-10-CM

## 2016-12-12 DIAGNOSIS — B371 Pulmonary candidiasis: Secondary | ICD-10-CM | POA: Diagnosis not present

## 2016-12-12 DIAGNOSIS — R262 Difficulty in walking, not elsewhere classified: Secondary | ICD-10-CM

## 2016-12-12 DIAGNOSIS — E876 Hypokalemia: Secondary | ICD-10-CM | POA: Diagnosis present

## 2016-12-12 DIAGNOSIS — Z79899 Other long term (current) drug therapy: Secondary | ICD-10-CM

## 2016-12-12 DIAGNOSIS — B001 Herpesviral vesicular dermatitis: Secondary | ICD-10-CM | POA: Diagnosis present

## 2016-12-12 DIAGNOSIS — J852 Abscess of lung without pneumonia: Secondary | ICD-10-CM | POA: Diagnosis not present

## 2016-12-12 LAB — LACTIC ACID, PLASMA: LACTIC ACID, VENOUS: 1.1 mmol/L (ref 0.5–1.9)

## 2016-12-12 LAB — CBC WITH DIFFERENTIAL/PLATELET
Basophils Absolute: 0 10*3/uL (ref 0.0–0.1)
Basophils Relative: 0 %
EOS ABS: 0 10*3/uL (ref 0.0–0.7)
Eosinophils Relative: 0 %
HEMATOCRIT: 31.8 % — AB (ref 36.0–46.0)
HEMOGLOBIN: 10.4 g/dL — AB (ref 12.0–15.0)
LYMPHS ABS: 0.9 10*3/uL (ref 0.7–4.0)
Lymphocytes Relative: 5 %
MCH: 29.4 pg (ref 26.0–34.0)
MCHC: 32.7 g/dL (ref 30.0–36.0)
MCV: 89.8 fL (ref 78.0–100.0)
MONOS PCT: 6 %
Monocytes Absolute: 1.1 10*3/uL — ABNORMAL HIGH (ref 0.1–1.0)
NEUTROS ABS: 17.2 10*3/uL — AB (ref 1.7–7.7)
NEUTROS PCT: 89 %
Platelets: 265 10*3/uL (ref 150–400)
RBC: 3.54 MIL/uL — AB (ref 3.87–5.11)
RDW: 15.1 % (ref 11.5–15.5)
WBC: 19.2 10*3/uL — AB (ref 4.0–10.5)

## 2016-12-12 LAB — COMPREHENSIVE METABOLIC PANEL
ALT: 1366 U/L — ABNORMAL HIGH (ref 14–54)
ANION GAP: 16 — AB (ref 5–15)
AST: 2024 U/L — ABNORMAL HIGH (ref 15–41)
Albumin: 3.3 g/dL — ABNORMAL LOW (ref 3.5–5.0)
Alkaline Phosphatase: 140 U/L — ABNORMAL HIGH (ref 38–126)
BUN: 44 mg/dL — ABNORMAL HIGH (ref 6–20)
CHLORIDE: 94 mmol/L — AB (ref 101–111)
CO2: 21 mmol/L — ABNORMAL LOW (ref 22–32)
CREATININE: 3.46 mg/dL — AB (ref 0.44–1.00)
Calcium: 8.5 mg/dL — ABNORMAL LOW (ref 8.9–10.3)
GFR, EST AFRICAN AMERICAN: 17 mL/min — AB (ref >60)
GFR, EST NON AFRICAN AMERICAN: 14 mL/min — AB (ref >60)
Glucose, Bld: 127 mg/dL — ABNORMAL HIGH (ref 65–99)
Potassium: 4.7 mmol/L (ref 3.5–5.1)
Sodium: 131 mmol/L — ABNORMAL LOW (ref 135–145)
Total Bilirubin: 1.2 mg/dL (ref 0.3–1.2)
Total Protein: 6 g/dL — ABNORMAL LOW (ref 6.5–8.1)

## 2016-12-12 LAB — I-STAT CG4 LACTIC ACID, ED: LACTIC ACID, VENOUS: 1.3 mmol/L (ref 0.5–1.9)

## 2016-12-12 MED ORDER — DEXTROSE 5 % IV SOLN
1.0000 g | Freq: Once | INTRAVENOUS | Status: AC
  Administered 2016-12-12: 1 g via INTRAVENOUS
  Filled 2016-12-12: qty 10

## 2016-12-12 MED ORDER — FENTANYL CITRATE (PF) 100 MCG/2ML IJ SOLN
100.0000 ug | INTRAMUSCULAR | Status: AC | PRN
  Administered 2016-12-13 – 2016-12-16 (×3): 100 ug via INTRAVENOUS
  Filled 2016-12-12 (×4): qty 2

## 2016-12-12 MED ORDER — ROCURONIUM BROMIDE 50 MG/5ML IV SOLN
INTRAVENOUS | Status: AC | PRN
Start: 2016-12-12 — End: 2016-12-12
  Administered 2016-12-12: 50 mg via INTRAVENOUS

## 2016-12-12 MED ORDER — SODIUM CHLORIDE 0.9 % IV SOLN: 250.0000 mL | INTRAVENOUS | Status: DC | PRN

## 2016-12-12 MED ORDER — SODIUM CHLORIDE 0.9 % IV SOLN
INTRAVENOUS | Status: DC
  Administered 2016-12-12 – 2016-12-13 (×2): via INTRAVENOUS

## 2016-12-12 MED ORDER — DEXTROSE 5 % IV SOLN
500.0000 mg | Freq: Once | INTRAVENOUS | Status: AC
  Administered 2016-12-12: 500 mg via INTRAVENOUS
  Filled 2016-12-12: qty 500

## 2016-12-12 MED ORDER — FAMOTIDINE IN NACL 20-0.9 MG/50ML-% IV SOLN
20.0000 mg | INTRAVENOUS | Status: DC
  Administered 2016-12-13 – 2016-12-14 (×3): 20 mg via INTRAVENOUS
  Filled 2016-12-12 (×3): qty 50

## 2016-12-12 MED ORDER — METHYLPREDNISOLONE SODIUM SUCC 125 MG IJ SOLR
60.0000 mg | INTRAMUSCULAR | Status: DC
  Administered 2016-12-12 – 2016-12-17 (×6): 60 mg via INTRAVENOUS
  Filled 2016-12-12 (×6): qty 2

## 2016-12-12 MED ORDER — PROPOFOL 1000 MG/100ML IV EMUL: 0.0000 ug/kg/min | INTRAVENOUS | Status: DC

## 2016-12-12 MED ORDER — PROPOFOL 1000 MG/100ML IV EMUL
5.0000 ug/kg/min | INTRAVENOUS | Status: DC
  Administered 2016-12-12: 5 ug/kg/min via INTRAVENOUS
  Administered 2016-12-13: 50 ug/kg/min via INTRAVENOUS
  Administered 2016-12-13: 70 ug/kg/min via INTRAVENOUS
  Administered 2016-12-13 (×4): 50 ug/kg/min via INTRAVENOUS
  Administered 2016-12-14: 70 ug/kg/min via INTRAVENOUS
  Administered 2016-12-14: 80 ug/kg/min via INTRAVENOUS
  Administered 2016-12-14 (×2): 70 ug/kg/min via INTRAVENOUS
  Administered 2016-12-14: 50 ug/kg/min via INTRAVENOUS
  Administered 2016-12-14: 80 ug/kg/min via INTRAVENOUS
  Administered 2016-12-14: 70 ug/kg/min via INTRAVENOUS
  Administered 2016-12-15 (×2): 80 ug/kg/min via INTRAVENOUS
  Administered 2016-12-15: 70 ug/kg/min via INTRAVENOUS
  Administered 2016-12-15: 80 ug/kg/min via INTRAVENOUS
  Administered 2016-12-15: 70 ug/kg/min via INTRAVENOUS
  Administered 2016-12-15 – 2016-12-16 (×7): 80 ug/kg/min via INTRAVENOUS
  Filled 2016-12-12 (×12): qty 100
  Filled 2016-12-12: qty 200
  Filled 2016-12-12 (×10): qty 100
  Filled 2016-12-12: qty 200
  Filled 2016-12-12 (×3): qty 100

## 2016-12-12 MED ORDER — ALBUTEROL (5 MG/ML) CONTINUOUS INHALATION SOLN
15.0000 mg/h | INHALATION_SOLUTION | RESPIRATORY_TRACT | Status: DC
  Administered 2016-12-12: 15 mg/h via RESPIRATORY_TRACT
  Filled 2016-12-12: qty 20

## 2016-12-12 MED ORDER — ACETAMINOPHEN 325 MG PO TABS: 650.0000 mg | ORAL_TABLET | ORAL | Status: DC | PRN

## 2016-12-12 MED ORDER — ONDANSETRON HCL 4 MG/2ML IJ SOLN: 4.0000 mg | Freq: Four times a day (QID) | INTRAMUSCULAR | Status: DC | PRN

## 2016-12-12 MED ORDER — HEPARIN SODIUM (PORCINE) 5000 UNIT/ML IJ SOLN
5000.0000 [IU] | Freq: Three times a day (TID) | INTRAMUSCULAR | Status: DC
  Filled 2016-12-12: qty 1

## 2016-12-12 MED ORDER — FENTANYL CITRATE (PF) 100 MCG/2ML IJ SOLN
100.0000 ug | INTRAMUSCULAR | Status: DC | PRN
  Administered 2016-12-13 – 2016-12-17 (×9): 100 ug via INTRAVENOUS
  Filled 2016-12-12 (×8): qty 2

## 2016-12-12 MED ORDER — DEXTROSE 5 % IV SOLN
500.0000 mg | INTRAVENOUS | Status: DC
  Administered 2016-12-13 – 2016-12-14 (×2): 500 mg via INTRAVENOUS
  Filled 2016-12-12 (×2): qty 500

## 2016-12-12 MED ORDER — ETOMIDATE 2 MG/ML IV SOLN
INTRAVENOUS | Status: AC | PRN
Start: 2016-12-12 — End: 2016-12-12
  Administered 2016-12-12: 20 mg via INTRAVENOUS

## 2016-12-12 MED ORDER — DEXTROSE 5 % IV SOLN
1.0000 g | INTRAVENOUS | Status: DC
  Administered 2016-12-13 – 2016-12-16 (×4): 1 g via INTRAVENOUS
  Filled 2016-12-12 (×5): qty 10

## 2016-12-12 NOTE — ED Notes (Signed)
Patient here by EMS for shortness of breath.  Was in the 101s O2 sat for EMS.  Patient received 10 mg albuterol and 1 duoneb for EMS.  Patient is Alert but very lethargic.  On BiPAP here.

## 2016-12-12 NOTE — ED Provider Notes (Signed)
[facility] DEPT Provider Note   CSN: [id number] Arrival date & time: 12/12/16  1908     History   Chief Complaint Chief Complaint  Patient presents with  . Shortness of Breath   Low 5 caveat due to altered mental status. HPI Amauria S Oldaker is a 51 y.o. female.  HPI Patient brought in with hypoxia and altered mental status. Found to be hypoxic with EMS with sats in the 60s. On BiPAP. Patient really cannot provide any history. Discussed with patient's daughter and states that she has been confused for the last few days. Sleeping most of the days depending on the daughter.   Past Medical History:  Diagnosis Date  . Allergic rhinitis   . Arthritis   . Asthma   . Back pain   . Bipolar 1 disorder ([place])   . Chronic low back pain 03/06/2014  . COPD (chronic obstructive pulmonary disease) ([place])   . Depression   . Fibroid   . Hypertension   . IBS (irritable bowel syndrome)   . Migraine   . Scoliosis     Patient Active Problem List   Diagnosis Date Noted  . Pneumonia 12/12/2016  . Hyponatremia 04/14/2016  . Opiate withdrawal ([place]) 04/14/2016  . 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 Leg edema 12/13/2015  . Acute CHF (congestive heart failure) ([place]) 12/13/2015  . Respiratory failure ([place]) 12/13/2015  . IBS (irritable bowel syndrome) 12/13/2015  . Abdominal pain 12/13/2015  . Suicidal ideation 12/13/2015  . CAP (community acquired pneumonia) 12/13/2015  . SOB (shortness of breath) 12/13/2015  . Dysuria 12/13/2015  . COPD with acute exacerbation ([place])   . Back pain   . Hypertension   . Arthritis   . Bipolar disorder ([place])   . Depression   . Essential hypertension   . Encephalopathy acute 12/03/2015  . Leukocytosis 12/03/2015  . Sinus tachycardia 11/24/2015  . Chronic narcotic dependence ([place]) 11/22/2015  . Closed fracture of multiple ribs of right side 11/22/2015  . Hypercalcemia 11/22/2015  . Chronic  hyponatremia 03/07/2015  . Atypical chest pain 01/09/2015  . Centrilobular emphysema ([place]) 01/09/2015  . Cigarette nicotine dependence without complication [id number]  . Fibromyalgia syndrome 01/09/2015  . Mixed hyperlipidemia 01/09/2015  . Chronic low back pain 03/06/2014    Past Surgical History:  Procedure Laterality Date  . CHOLECYSTECTOMY    . DILATION AND CURETTAGE OF UTERUS    . EXPLORATORY LAPAROTOMY     WHEN PATIENT WAS YOUNG  . RIGHT FOOT SURGERY    . TUBAL LIGATION Bilateral     OB History    No data available       Home Medications    Prior to Admission medications   Medication Sig Start Date End Date Taking? Authorizing Provider  albuterol (PROAIR HFA) 108 (90 Base) MCG/ACT inhaler Inhale 2 puffs into the lungs every 6 (six) hours as needed for wheezing or shortness of breath. 12/01/16   Shaylar Charmian Muff, MD  albuterol (PROVENTIL) (2.5 MG/3ML) 0.083% nebulizer solution Take 3 mLs (2.5 mg total) by nebulization every 6 (six) hours as needed for wheezing or shortness of breath. 08/19/16   Valentina Shaggy, MD  Azelastine HCl 0.15 % SOLN Place 1 spray into both nostrils 2 (two) times daily. 12/01/16   Shaylar Charmian Muff, MD  beclomethasone (QVAR) 40 MCG/ACT inhaler Inhale 4 puffs into the lungs daily at 12 noon. 08/04/16   Valentina Shaggy, MD  benzonatate (TESSALON PERLES) 100 MG  capsule Take 1 capsule (100 mg total) by mouth 3 (three) times daily as needed for cough. 08/14/16   Valentina Shaggy, MD  calcium carbonate (TUMS - DOSED IN MG ELEMENTAL CALCIUM) 500 MG chewable tablet Chew 1 tablet (200 mg of elemental calcium total) by mouth 2 (two) times daily. Patient not taking: Reported on 12/01/2016 12/17/15   Debbe Odea, MD  clonazePAM Bobbye [place]) 1 MG tablet Take by mouth. 04/15/16   Historical Provider, MD  ergocalciferol (VITAMIN D2) 50000 units capsule Take 50,000 Units by mouth once a week. Saturday    Historical Provider, MD  escitalopram  (LEXAPRO) 20 MG tablet Take 20 mg by mouth 2 (two) times daily.     Historical Provider, MD  gabapentin (NEURONTIN) 400 MG capsule Take 800 mg by mouth 3 (three) times daily. 800mg  in the morning, 800mg  in the evening, and 1600mg  at bedtime    Historical Provider, MD  ipratropium (ATROVENT) 0.03 % nasal spray 1-2 sprays every 6 hours as needed 12/01/16   Shaylar Charmian Muff, MD  levocetirizine (XYZAL) 5 MG tablet Take 1 tablet (5 mg total) by mouth every evening. 05/14/16   Gean Quint, MD  loratadine (CLARITIN) 10 MG tablet Take 1 tablet (10 mg total) by mouth every morning. 12/01/16   Shaylar Charmian Muff, MD  methocarbamol (ROBAXIN) 750 MG tablet Take 1,500 mg by mouth 3 (three) times daily.     Historical Provider, MD  metoprolol tartrate (LOPRESSOR) 25 MG tablet Take 25 mg by mouth 2 (two) times daily. Reported on 02/21/2016    Historical Provider, MD  mometasone-formoterol (DULERA) 200-5 MCG/ACT AERO Inhale 2 puffs into the lungs 2 (two) times daily. 08/04/16   Valentina Shaggy, MD  ondansetron (ZOFRAN) 4 MG tablet Take 4 mg by mouth every 8 (eight) hours as needed for nausea or vomiting.    Historical Provider, MD  oxyCODONE (ROXICODONE) 15 MG immediate release tablet Take 15 mg by mouth every 4 (four) hours as needed for pain.    Historical Provider, MD  QUEtiapine (SEROQUEL) 50 MG tablet Take 50 mg by mouth at bedtime. Reported on 05/14/2016    Historical Provider, MD  rosuvastatin (CRESTOR) 10 MG tablet Take 10 mg by mouth daily. Reported on 05/14/2016    Historical Provider, MD  Spacer/Aero-Holding Chambers (AEROCHAMBER PLUS FLO-VU MEDIUM) MISC Use with metered dose inhaler 03/27/16   Roselyn Malachy Moan, MD  tiotropium (SPIRIVA HANDIHALER) 18 MCG inhalation capsule Place 1 capsule (18 mcg total) into inhaler and inhale daily. 12/01/16   Shaylar Charmian Muff, MD    Family History Family History  Problem Relation Age of Onset  . Stroke Mother   . Hypertension Brother   .  Hypertension Brother   . Hypertension Brother   . Allergic rhinitis Neg Hx   . Angioedema Neg Hx   . Asthma Neg Hx   . Atopy Neg Hx   . Eczema Neg Hx   . Immunodeficiency Neg Hx   . Urticaria Neg Hx     Social History Social History  Substance Use Topics  . Smoking status: Current Every Day Smoker    Packs/day: 0.50    Types: Cigarettes  . Smokeless tobacco: Never Used  . Alcohol use No     Allergies   Cymbalta [duloxetine hcl]; Nyquil multi-symptom [pseudoeph-doxylamine-dm-apap]; Paxil [paroxetine hcl]; Promethazine; Tramadol; Wellbutrin [bupropion]; and Zoloft [sertraline]   Review of Systems Review of Systems  Unable to perform ROS: Mental status change     Physical Exam  Updated Vital Signs BP 138/95   Pulse 109   Temp 99.7 F (37.6 C) (Rectal)   Resp 20   Ht 5\' 2"  (1.575 m)   Wt 165 lb (74.8 kg)   SpO2 96%   BMI 30.18 kg/m   Physical Exam  Constitutional: She appears well-developed.  HENT:  Head: Atraumatic.  Eyes:  Pupils mildly constricted  Neck: JVD present.  Cardiovascular:  Mild tachycardia  Pulmonary/Chest:  Diffuse harsh breath sounds. On BiPAP.  Abdominal: She exhibits no distension.  Musculoskeletal: She exhibits no tenderness.  Neurological:  Difficult to get history from. Unable to understand very well and unsure of speaking clearly.  Skin: Skin is warm.     ED Treatments / Results  Labs (all labs ordered are listed, but only abnormal results are displayed) Labs Reviewed  COMPREHENSIVE METABOLIC PANEL - Abnormal; Notable for the following:       Result Value   Sodium 131 (*)    Chloride 94 (*)    CO2 21 (*)    Glucose, Bld 127 (*)    BUN 44 (*)    Creatinine, Ser 3.46 (*)    Calcium 8.5 (*)    Total Protein 6.0 (*)    Albumin 3.3 (*)    AST 2,024 (*)    ALT 1,366 (*)    Alkaline Phosphatase 140 (*)    GFR calc non Af Amer 14 (*)    GFR calc Af Amer 17 (*)    Anion gap 16 (*)    All other components within normal  limits  CBC WITH DIFFERENTIAL/PLATELET - Abnormal; Notable for the following:    WBC 19.2 (*)    RBC 3.54 (*)    Hemoglobin 10.4 (*)    HCT 31.8 (*)    Neutro Abs 17.2 (*)    Monocytes Absolute 1.1 (*)    All other components within normal limits  CULTURE, BLOOD (ROUTINE X 2)  CULTURE, BLOOD (ROUTINE X 2)  RESPIRATORY PANEL BY PCR  LACTIC ACID, PLASMA  URINALYSIS, ROUTINE W REFLEX MICROSCOPIC  RAPID URINE DRUG SCREEN, HOSP PERFORMED  TRIGLYCERIDES  ACETAMINOPHEN LEVEL  HEPATITIS PANEL, ACUTE  AMMONIA  HIV ANTIBODY (ROUTINE TESTING)  STREP PNEUMONIAE URINARY ANTIGEN  CBC  BASIC METABOLIC PANEL  MAGNESIUM  PHOSPHORUS  I-STAT CG4 LACTIC ACID, ED  I-STAT ARTERIAL BLOOD GAS, ED    EKG  EKG Interpretation  Date/Time:  Friday December 12 2016 19:09:51 EST Ventricular Rate:  105 PR Interval:    QRS Duration: 101 QT Interval:  469 QTC Calculation: 620 R Axis:   44 Text Interpretation:  Sinus tachycardia Probable left atrial enlargement Low voltage, precordial leads RSR' in V1 or V2, right VCD or RVH Prolonged QT interval Confirmed by Alvino Chapel  MD, Ovid Curd [phone]) on 12/12/2016 7:42:43 PM       Radiology Dg Chest Portable 1 View  Result Date: 12/12/2016 CLINICAL DATA:  Post intubation EXAM: PORTABLE CHEST 1 VIEW COMPARISON:  Chest x-ray from earlier same day. FINDINGS: Endotracheal tube well positioned with tip approximately 2 cm above the carina. Nasogastric tube passes below the diaphragm. Dense opacity at the left lung base is unchanged in extent although denser on the present exam. The milder patchy airspace opacities at the right lung base are unchanged. Cardiomediastinal silhouette appears stable. IMPRESSION: 1. Endotracheal tube well positioned with tip just above the level of the carina. 2. Enteric tube passes below the diaphragm. 3. Large dense opacity at the left lung base, stable in extent but  increased in density compared to the chest x-ray from earlier today suggesting  airspace collapse, increasingly confluent edema and/or increasing pleural effusion. Suspect additional mild edema at the right lung base, stable. Electronically Signed   By: Franki Cabot M.D.   On: 12/12/2016 21:42   Dg Chest Portable 1 View  Result Date: 12/12/2016 CLINICAL DATA:  Dyspnea EXAM: PORTABLE CHEST 1 VIEW COMPARISON:  CXR 08/14/2016, chest CT 12/12/2015 FINDINGS: Patchy airspace opacities are seen along the periphery of the left lung and right lung base. Multilobar pneumonia might account for this appearance. Asymmetric pulmonary edema would be less common. Heart is borderline enlarged. Slightly more prominent aorta likely due to patient rotation. No acute osseous abnormality. Small left effusion is not entirely excluded. IMPRESSION: Pulmonary consolidations noted bilaterally which may reflect multilobar pneumonia involving both lung bases, left upper lobe and lingula. Cannot exclude a small left effusion given hazy opacity at the left lung base. Electronically Signed   By: Ashley Royalty M.D.   On: 12/12/2016 20:05    Procedures Procedures (including critical care time)  Medications Ordered in ED Medications  fentaNYL (SUBLIMAZE) injection 100 mcg (not administered)  fentaNYL (SUBLIMAZE) injection 100 mcg (not administered)  propofol (DIPRIVAN) 1000 MG/100ML infusion (30 mcg/kg/min  74.8 kg Intravenous Rate/Dose Change 12/12/16 2232)  albuterol (PROVENTIL,VENTOLIN) solution continuous neb (0 mg/hr Nebulization Stopped 12/12/16 2234)  methylPREDNISolone sodium succinate (SOLU-MEDROL) 125 mg/2 mL injection 60 mg (60 mg Intravenous Given 12/12/16 2222)  0.9 %  sodium chloride infusion ( Intravenous New Bag/Given 12/12/16 2026)  azithromycin (ZITHROMAX) 500 mg in dextrose 5 % 250 mL IVPB (not administered)  cefTRIAXone (ROCEPHIN) 1 g in dextrose 5 % 50 mL IVPB (not administered)  0.9 %  sodium chloride infusion (not administered)  heparin injection 5,000 Units (not administered)  famotidine  (PEPCID) IVPB 20 mg premix (not administered)  acetaminophen (TYLENOL) tablet 650 mg (not administered)  ondansetron (ZOFRAN) injection 4 mg (not administered)  cefTRIAXone (ROCEPHIN) 1 g in dextrose 5 % 50 mL IVPB (0 g Intravenous Stopped 12/12/16 2110)  azithromycin (ZITHROMAX) 500 mg in dextrose 5 % 250 mL IVPB (0 mg Intravenous Stopped 12/12/16 2225)  rocuronium (ZEMURON) injection (50 mg Intravenous Given 12/12/16 2057)  etomidate (AMIDATE) injection (20 mg Intravenous Given 12/12/16 2056)     Initial Impression / Assessment and Plan / ED Course  I have reviewed the triage vital signs and the nursing notes.  Pertinent labs & imaging results that were available during my care of the patient were reviewed by me and considered in my medical decision making (see chart for details).     Patient with hypoxia and mental status changes. History of chronic asthma. Hypoxic for EMS. X-ray showed possible pneumonia. Continues to have decreased mental status and if anything has worsened. Nonverbal. Discussed with patient's daughter and patient was intubated. Will admit to ICU.  CRITICAL CARE Performed by: Mackie Pai Total critical care time: 30 minutes Critical care time was exclusive of separately billable procedures and treating other patients. Critical care was necessary to treat or prevent imminent or life-threatening deterioration. Critical care was time spent personally by me on the following activities: development of treatment plan with patient and/or surrogate as well as nursing, discussions with consultants, evaluation of patient's response to treatment, examination of patient, obtaining history from patient or surrogate, ordering and performing treatments and interventions, ordering and review of laboratory studies, ordering and review of radiographic studies, pulse oximetry and re-evaluation of patient's condition.  INTUBATION Performed by:  Lazaria Schaben R.  Required items:  required blood products, implants, devices, and special equipment available Patient identity confirmed: provided demographic data and hospital-assigned identification number Time out: Immediately prior to procedure a "time out" was called to verify the correct patient, procedure, equipment, support staff and site/side marked as required.  Indications: respiratory failure  Intubation method: Glidescope Laryngoscopy   Preoxygenation: BVM  Sedatives: Etomidate Paralytic: Roccuronium  Tube Size: 7.5 cuffed  Post-procedure assessment: chest rise and ETCO2 monitor Breath sounds: equal and absent over the epigastrium Tube secured with: ETT holder Chest x-ray interpreted by radiologist and me.  Chest x-ray findings: endotracheal tube in appropriate position  Patient tolerated the procedure well with no immediate complications.      Final Clinical Impressions(s) / ED Diagnoses   Final diagnoses:  HCAP (healthcare-associated pneumonia)  Acute respiratory failure, unspecified whether with hypoxia or hypercapnia [facility])    New Prescriptions New Prescriptions   No medications on file     Davonna Belling, MD 12/12/16 2243

## 2016-12-12 NOTE — ED Notes (Signed)
[name] (daughter)  [phone] or [phone]

## 2016-12-12 NOTE — ED Notes (Signed)
Patient transported to CT 

## 2016-12-12 NOTE — ED Notes (Signed)
Pt daughter just told this RN that the pt has been acting altered for the last 2 days. Pt has been asking daughter to gas up car to go back to a house in another state that they havnt lived at in years.  Pt said something about finding the letter Q. Pt has been sleeping most of the last two days and has been dependent on the daughter for ADL's.  This is new onset and not normal for pt. ED-P notified.

## 2016-12-12 NOTE — ED Notes (Signed)
Pt's eyes are open and pt is biting at the tub. Md made aware. Pt given bolus of propofol 30 mcg, verbal from MD.

## 2016-12-12 NOTE — Progress Notes (Signed)
Pt intubated by EDP with 7.5 ETT secured at 21 at lip with Hollister tube holder. Vent settings are PRVC 420/16/60/5. Pt tol well. Will cont to monitor and obtain ABG.

## 2016-12-12 NOTE — H&P (Signed)
PULMONARY / CRITICAL CARE MEDICINE   Name: [name] MRN: [id number] DOB: 1966/08/16    ADMISSION DATE:  12/12/2016 CONSULTATION DATE:  12/12/2016  REFERRING MD:  Alvino Chapel, MD (EDP)  CHIEF COMPLAINT:  Asthma exacerbation  HISTORY OF PRESENT ILLNESS:   51 year old woman with 45 pack year smoking history and asthma, allergic rhinitis, COPD, HTN, Bipolar d/o presenting with dyspnea found to have acute hypoxic respiratory failure and multilobar pneumonia on CXR. History obtained from daughter and EMR due to acute encephalopathy. She has been feeling poorly for the past 2-3 days with fatigue, malaise, lethargy, subjective fevers, chills, myalgias, confusion. She has a cough productive of yellow green sputum. She has dyspnea. She has lower midsternal chest pain/epigastric pain. She has nausea but no vomiting. No diarrhea, dysuria or hematuria. Possible focal weakness with not lifting her L arm.  O2 sat was 60s upon EMS arrival. She was given albuterol and Duoneb by EMS. She was placed on BiPAP. Was noted to be alert but very lethargic. She was intubated in the ED. She was started on ceftriaxone and azithromycin.  PAST MEDICAL HISTORY :  She  has a past medical history of Allergic rhinitis; Arthritis; Asthma; Back pain; Bipolar 1 disorder ([place]); Chronic low back pain (03/06/2014); COPD (chronic obstructive pulmonary disease) ([place]); Depression; Fibroid; Hypertension; IBS (irritable bowel syndrome); Migraine; and Scoliosis.  PAST SURGICAL HISTORY: She  has a past surgical history that includes Cholecystectomy; Tubal ligation (Bilateral); Dilation and curettage of uterus; RIGHT FOOT SURGERY; and Exploratory laparotomy.  Allergies  Allergen Reactions  . Cymbalta [Duloxetine Hcl] Other (See Comments)    Mood changes...mean  . Nyquil Multi-Symptom [Pseudoeph-Doxylamine-Dm-Apap] Hives  . Paxil [Paroxetine Hcl] Other (See Comments)    Mean and aggresive  . Promethazine Itching  . Tramadol Itching   . Wellbutrin [Bupropion] Other (See Comments)    Rapid heartrate  . Zoloft [Sertraline] Other (See Comments)    Anger.    Current Facility-Administered Medications on File Prior to Encounter  Medication  . Mepolizumab SOLR 100 mg   Current Outpatient Prescriptions on File Prior to Encounter  Medication Sig  . albuterol (PROAIR HFA) 108 (90 Base) MCG/ACT inhaler Inhale 2 puffs into the lungs every 6 (six) hours as needed for wheezing or shortness of breath.  Marland Kitchen albuterol (PROVENTIL) (2.5 MG/3ML) 0.083% nebulizer solution Take 3 mLs (2.5 mg total) by nebulization every 6 (six) hours as needed for wheezing or shortness of breath.  . Azelastine HCl 0.15 % SOLN Place 1 spray into both nostrils 2 (two) times daily.  . beclomethasone (QVAR) 40 MCG/ACT inhaler Inhale 4 puffs into the lungs daily at 12 noon.  . benzonatate (TESSALON PERLES) 100 MG capsule Take 1 capsule (100 mg total) by mouth 3 (three) times daily as needed for cough.  . calcium carbonate (TUMS - DOSED IN MG ELEMENTAL CALCIUM) 500 MG chewable tablet Chew 1 tablet (200 mg of elemental calcium total) by mouth 2 (two) times daily. (Patient not taking: Reported on 12/01/2016)  . clonazePAM (KLONOPIN) 1 MG tablet Take by mouth.  . ergocalciferol (VITAMIN D2) 50000 units capsule Take 50,000 Units by mouth once a week. Saturday  . escitalopram (LEXAPRO) 20 MG tablet Take 20 mg by mouth 2 (two) times daily.   Marland Kitchen gabapentin (NEURONTIN) 400 MG capsule Take 800 mg by mouth 3 (three) times daily. 800mg  in the morning, 800mg  in the evening, and 1600mg  at bedtime  . ipratropium (ATROVENT) 0.03 % nasal spray 1-2 sprays every 6 hours as needed  .  levocetirizine (XYZAL) 5 MG tablet Take 1 tablet (5 mg total) by mouth every evening.  . loratadine (CLARITIN) 10 MG tablet Take 1 tablet (10 mg total) by mouth every morning.  . methocarbamol (ROBAXIN) 750 MG tablet Take 1,500 mg by mouth 3 (three) times daily.   . metoprolol tartrate (LOPRESSOR) 25 MG  tablet Take 25 mg by mouth 2 (two) times daily. Reported on 02/21/2016  . mometasone-formoterol (DULERA) 200-5 MCG/ACT AERO Inhale 2 puffs into the lungs 2 (two) times daily.  . ondansetron (ZOFRAN) 4 MG tablet Take 4 mg by mouth every 8 (eight) hours as needed for nausea or vomiting.  Marland Kitchen oxyCODONE (ROXICODONE) 15 MG immediate release tablet Take 15 mg by mouth every 4 (four) hours as needed for pain.  Marland Kitchen QUEtiapine (SEROQUEL) 50 MG tablet Take 50 mg by mouth at bedtime. Reported on 05/14/2016  . rosuvastatin (CRESTOR) 10 MG tablet Take 10 mg by mouth daily. Reported on 05/14/2016  . Spacer/Aero-Holding Chambers (AEROCHAMBER PLUS FLO-VU MEDIUM) MISC Use with metered dose inhaler  . tiotropium (SPIRIVA HANDIHALER) 18 MCG inhalation capsule Place 1 capsule (18 mcg total) into inhaler and inhale daily.    Family History  Problem Relation Age of Onset  . Stroke Mother   . Hypertension Brother   . Hypertension Brother   . Hypertension Brother   . Allergic rhinitis Neg Hx   . Angioedema Neg Hx   . Asthma Neg Hx   . Atopy Neg Hx   . Eczema Neg Hx   . Immunodeficiency Neg Hx   . Urticaria Neg Hx    SOCIAL HISTORY: She  reports that she has been smoking Cigarettes.  She has been smoking about 0.50 packs per day. She has never used smokeless tobacco. She reports that she does not drink alcohol or use drugs.  REVIEW OF SYSTEMS:   Unable to obtain due to acute encephalopathy  SUBJECTIVE:  Sedated and intubated  VITAL SIGNS: BP 124/71 (BP Location: Left Arm)   Pulse 94   Temp 99.7 F (37.6 C) (Rectal)   Resp 15   Ht 5\' 2"  (1.575 m)   Wt 165 lb (74.8 kg)   SpO2 100%   BMI 30.18 kg/m   HEMODYNAMICS:    VENTILATOR SETTINGS: Vent Mode: PRVC FiO2 (%):  [60 %] 60 % Set Rate:  [16 bmp] 16 bmp Vt Set:  [420 mL] 420 mL PEEP:  [5 cmH20] 5 cmH20 Plateau Pressure:  [19 cmH20] 19 cmH20  INTAKE / OUTPUT: No intake/output data recorded.  PHYSICAL EXAMINATION: General:  NAD Neuro:  Sedated  on vent HEENT:  PERRL bilaterally, dry mucous membranes Cardiovascular:  RRR Lungs:  Wheezes through bilateral lung fields Abdomen:  Soft, nontender, nondistended Musculoskeletal:  No LE edema Skin:  Warm and well perfused  LABS:  BMET  Recent Labs Lab 12/12/16 2003  NA 131*  K 4.7  CL 94*  CO2 21*  BUN 44*  CREATININE 3.46*  GLUCOSE 127*    Electrolytes  Recent Labs Lab 12/12/16 2003  CALCIUM 8.5*    CBC  Recent Labs Lab 12/12/16 2003  WBC 19.2*  HGB 10.4*  HCT 31.8*  PLT 265    Coag's No results for input(s): APTT, INR in the last 168 hours.  Sepsis Markers  Recent Labs Lab 12/12/16 2028  LATICACIDVEN 1.30    ABG No results for input(s): PHART, PCO2ART, PO2ART in the last 168 hours.  Liver Enzymes  Recent Labs Lab 12/12/16 2003  AST 2,024*  ALT  1,366*  ALKPHOS 140*  BILITOT 1.2  ALBUMIN 3.3*    Cardiac Enzymes No results for input(s): TROPONINI, PROBNP in the last 168 hours.  Glucose No results for input(s): GLUCAP in the last 168 hours.  Imaging Dg Chest Portable 1 View  Result Date: 12/12/2016 CLINICAL DATA:  Dyspnea EXAM: PORTABLE CHEST 1 VIEW COMPARISON:  CXR 08/14/2016, chest CT 12/12/2015 FINDINGS: Patchy airspace opacities are seen along the periphery of the left lung and right lung base. Multilobar pneumonia might account for this appearance. Asymmetric pulmonary edema would be less common. Heart is borderline enlarged. Slightly more prominent aorta likely due to patient rotation. No acute osseous abnormality. Small left effusion is not entirely excluded. IMPRESSION: Pulmonary consolidations noted bilaterally which may reflect multilobar pneumonia involving both lung bases, left upper lobe and lingula. Cannot exclude a small left effusion given hazy opacity at the left lung base. Electronically Signed   By: Ashley Royalty M.D.   On: 12/12/2016 20:05     STUDIES:  EKG 1/19>> Sinus tachycardia, T wave inversion in inferior leads  which are new compared to previous EKG, QTc 620 CXR 1/19>> Consolidations bilaterally CT head 1/19>> Left occipital lobe probable acute or early subacute infarction. No acute intracranial hemorrhage.  CULTURES: Viral resp panel>> Resp Cx>> Blood Cx>>  ANTIBIOTICS: Ceftriaxone 1/19>> Azithromycin 1/19>>  SIGNIFICANT EVENTS: 1/19>> Intubated, admitted  LINES/TUBES: ETT 1/19>> Foley 1/19>> OG 1/19>>  DISCUSSION: 51 year old woman with 45 pack year smoking history and asthma, allergic rhinitis, COPD, HTN, Bipolar d/o presenting with dyspnea found to have acute asthma exacerbation, acute hypoxic respiratory failure and multilobar pneumonia on CXR.  ASSESSMENT / PLAN:  PULMONARY A: Acute asthma exacerbation Acute hypoxic respiratory failure COPD Smoker Allergic rhinitis Multilobar pneumonia P:   Full vent support F/u CXR F/u ABG Cont albuterol neb Abx per below Solumedrol 60mg  daily  CARDIOVASCULAR A:  HTN Abnormal EKG P:  F/u Echo F/u lipid panel Hold off on statin for now Trend trops Cardiac monitoring Repeat EKG in AM Carotid u/s Heparin gtt  RENAL A:   Hyponatremia >> Na [id number] Metabolic acidosis with increased anion gap>> Bicarb 21, gap 16.  AKI >> Cr 3.46 (baseline 0.7) P:   NS@100  Check CK  GASTROINTESTINAL A:   Transaminitis >> AST 2024, ALT 1366 P:   NPO Pepcid for SUP Acute hepatitis panel Tylenol lvl RUQ U/S  HEMATOLOGIC A:   Leukocytosis >> 19.2 Acute Anemia >> Hgb 10.4 on admission. Baseline around 12.  P:  Hep gtt  INFECTIOUS A:   Multilobar PNA P:   F/u BCx F/u Resp Cx, viral resp panel Follow PCT Urine strep pneumo Ceftriaxone 1/19>> Azithromycin 1/19>>  ENDOCRINE A:   Hyperglycemia P:   Check Hgb A1c SSI  NEUROLOGIC/PSYCH A:   Acute encephalopathy Acute CVA Bipolar d/o P:   RASS goal: 0 to -1 Propofol gtt, fent prn ASA daily MRI brain Consult neuro in AM   FAMILY  - Updates: Daughter updated at  bedside and via phone after CT head results returned 1/20.  - Inter-disciplinary family meet or Palliative Care meeting due by:  1/27   Jacques Earthly, MD  Internal Medicine PGY-3  Pulmonary and [place] Pager: [phone] 12/12/2016, 8:58 PM

## 2016-12-13 ENCOUNTER — Inpatient Hospital Stay (HOSPITAL_COMMUNITY): Payer: Medicaid Other

## 2016-12-13 DIAGNOSIS — I639 Cerebral infarction, unspecified: Secondary | ICD-10-CM

## 2016-12-13 DIAGNOSIS — I6789 Other cerebrovascular disease: Secondary | ICD-10-CM

## 2016-12-13 DIAGNOSIS — J9600 Acute respiratory failure, unspecified whether with hypoxia or hypercapnia: Secondary | ICD-10-CM

## 2016-12-13 LAB — RAPID URINE DRUG SCREEN, HOSP PERFORMED
Amphetamines: NOT DETECTED
Barbiturates: NOT DETECTED
Benzodiazepines: POSITIVE — AB
Cocaine: NOT DETECTED
Opiates: POSITIVE — AB
Tetrahydrocannabinol: NOT DETECTED

## 2016-12-13 LAB — BLOOD GAS, ARTERIAL
Acid-base deficit: 0.5 mmol/L (ref 0.0–2.0)
BICARBONATE: 25 mmol/L (ref 20.0–28.0)
Drawn by: 40415
FIO2: 60
LHR: 16 {breaths}/min
O2 Saturation: 96.8 %
PATIENT TEMPERATURE: 98.6
PEEP: 5 cmH2O
VT: 420 mL
pCO2 arterial: 51.8 mmHg — ABNORMAL HIGH (ref 32.0–48.0)
pH, Arterial: 7.305 — ABNORMAL LOW (ref 7.350–7.450)
pO2, Arterial: 110 mmHg — ABNORMAL HIGH (ref 83.0–108.0)

## 2016-12-13 LAB — RESPIRATORY PANEL BY PCR
ADENOVIRUS-RVPPCR: NOT DETECTED
Bordetella pertussis: NOT DETECTED
CORONAVIRUS NL63-RVPPCR: NOT DETECTED
CORONAVIRUS OC43-RVPPCR: NOT DETECTED
Chlamydophila pneumoniae: NOT DETECTED
Coronavirus 229E: NOT DETECTED
Coronavirus HKU1: NOT DETECTED
Influenza A: NOT DETECTED
Influenza B: NOT DETECTED
Metapneumovirus: NOT DETECTED
Mycoplasma pneumoniae: NOT DETECTED
PARAINFLUENZA VIRUS 3-RVPPCR: NOT DETECTED
PARAINFLUENZA VIRUS 4-RVPPCR: NOT DETECTED
Parainfluenza Virus 1: NOT DETECTED
Parainfluenza Virus 2: NOT DETECTED
RHINOVIRUS / ENTEROVIRUS - RVPPCR: NOT DETECTED
Respiratory Syncytial Virus: NOT DETECTED

## 2016-12-13 LAB — GLUCOSE, CAPILLARY
GLUCOSE-CAPILLARY: 162 mg/dL — AB (ref 65–99)
GLUCOSE-CAPILLARY: 142 mg/dL — AB (ref 65–99)
Glucose-Capillary: 125 mg/dL — ABNORMAL HIGH (ref 65–99)
Glucose-Capillary: 128 mg/dL — ABNORMAL HIGH (ref 65–99)
Glucose-Capillary: 172 mg/dL — ABNORMAL HIGH (ref 65–99)
Glucose-Capillary: 182 mg/dL — ABNORMAL HIGH (ref 65–99)

## 2016-12-13 LAB — ECHOCARDIOGRAM COMPLETE
CHL CUP TV REG PEAK VELOCITY: 299 cm/s
E decel time: 161 msec
E/e' ratio: 8.36
FS: 47 % — AB (ref 28–44)
HEIGHTINCHES: 64 in
IVS/LV PW RATIO, ED: 0.93
LA ID, A-P, ES: 41 mm
LA diam index: 2.25 cm/m2
LA vol A4C: 41.1 ml
LA vol index: 29.2 mL/m2
LAVOL: 53.2 mL
LDCA: 3.46 cm2
LEFT ATRIUM END SYS DIAM: 41 mm
LV E/e' medial: 8.36
LV E/e'average: 8.36
LV PW d: 10.9 mm — AB (ref 0.6–1.1)
LV TDI E'LATERAL: 12.8
LVELAT: 12.8 cm/s
LVOT VTI: 24.8 cm
LVOT diameter: 21 mm
LVOT peak grad rest: 6 mmHg
LVOTPV: 127 cm/s
LVOTSV: 86 mL
MV Dec: 161
MV Peak grad: 5 mmHg
MV pk A vel: 106 m/s
MV pk E vel: 107 m/s
RV LATERAL S' VELOCITY: 15.9 cm/s
RV sys press: 44 mmHg
TAPSE: 23.6 mm
TDI e' medial: 7.27
TR max vel: 299 cm/s
WEIGHTICAEL: 2687.85 [oz_av]

## 2016-12-13 LAB — CBC
HCT: 27 % — ABNORMAL LOW (ref 36.0–46.0)
HEMATOCRIT: 26.6 % — AB (ref 36.0–46.0)
HEMOGLOBIN: 8.6 g/dL — AB (ref 12.0–15.0)
Hemoglobin: 8.8 g/dL — ABNORMAL LOW (ref 12.0–15.0)
MCH: 29.2 pg (ref 26.0–34.0)
MCH: 29.6 pg (ref 26.0–34.0)
MCHC: 32.3 g/dL (ref 30.0–36.0)
MCHC: 32.6 g/dL (ref 30.0–36.0)
MCV: 90.2 fL (ref 78.0–100.0)
MCV: 90.9 fL (ref 78.0–100.0)
PLATELETS: 213 10*3/uL (ref 150–400)
Platelets: 224 10*3/uL (ref 150–400)
RBC: 2.95 MIL/uL — ABNORMAL LOW (ref 3.87–5.11)
RBC: 2.97 MIL/uL — AB (ref 3.87–5.11)
RDW: 15.4 % (ref 11.5–15.5)
RDW: 15.5 % (ref 11.5–15.5)
WBC: 20 10*3/uL — ABNORMAL HIGH (ref 4.0–10.5)
WBC: 19.4 10*3/uL — ABNORMAL HIGH (ref 4.0–10.5)

## 2016-12-13 LAB — CK: Total CK: 3734 U/L — ABNORMAL HIGH (ref 38–234)

## 2016-12-13 LAB — PROCALCITONIN: PROCALCITONIN: 1.85 ng/mL

## 2016-12-13 LAB — URINALYSIS, ROUTINE W REFLEX MICROSCOPIC
BILIRUBIN URINE: NEGATIVE
Glucose, UA: NEGATIVE mg/dL
KETONES UR: NEGATIVE mg/dL
LEUKOCYTES UA: NEGATIVE
NITRITE: NEGATIVE
PROTEIN: 30 mg/dL — AB
SPECIFIC GRAVITY, URINE: 1.02 (ref 1.005–1.030)
pH: 5 (ref 5.0–8.0)

## 2016-12-13 LAB — STREP PNEUMONIAE URINARY ANTIGEN
Strep Pneumo Urinary Antigen: POSITIVE — AB
Strep Pneumo Urinary Antigen: NEGATIVE

## 2016-12-13 LAB — MRSA PCR SCREENING: MRSA by PCR: NEGATIVE

## 2016-12-13 LAB — HIV ANTIBODY (ROUTINE TESTING W REFLEX): HIV SCREEN 4TH GENERATION: NONREACTIVE

## 2016-12-13 LAB — BASIC METABOLIC PANEL
Anion gap: 10 (ref 5–15)
BUN: 30 mg/dL — ABNORMAL HIGH (ref 6–20)
CHLORIDE: 101 mmol/L (ref 101–111)
CO2: 24 mmol/L (ref 22–32)
CREATININE: 1.54 mg/dL — AB (ref 0.44–1.00)
Calcium: 7.9 mg/dL — ABNORMAL LOW (ref 8.9–10.3)
GFR calc non Af Amer: 38 mL/min — ABNORMAL LOW (ref >60)
GFR, EST AFRICAN AMERICAN: 44 mL/min — AB (ref >60)
Glucose, Bld: 185 mg/dL — ABNORMAL HIGH (ref 65–99)
Potassium: 4.1 mmol/L (ref 3.5–5.1)
Sodium: 135 mmol/L (ref 135–145)

## 2016-12-13 LAB — PHOSPHORUS: PHOSPHORUS: 2.9 mg/dL (ref 2.5–4.6)

## 2016-12-13 LAB — LIPID PANEL
CHOL/HDL RATIO: 3.1 ratio
Cholesterol: 113 mg/dL (ref 0–200)
HDL: 36 mg/dL — AB (ref >40)
LDL Cholesterol: 52 mg/dL (ref 0–99)
Triglycerides: 127 mg/dL (ref <150)
VLDL: 25 mg/dL (ref 0–40)

## 2016-12-13 LAB — ACETAMINOPHEN LEVEL: Acetaminophen (Tylenol), Serum: <10 ug/mL — ABNORMAL LOW (ref 10–30)

## 2016-12-13 LAB — MAGNESIUM: Magnesium: 1.8 mg/dL (ref 1.7–2.4)

## 2016-12-13 LAB — TROPONIN I
Troponin I: 1.25 ng/mL (ref <0.03)
Troponin I: 0.97 ng/mL (ref <0.03)
Troponin I: 0.93 ng/mL (ref <0.03)

## 2016-12-13 LAB — SODIUM, URINE, RANDOM: Sodium, Ur: <10 mmol/L

## 2016-12-13 LAB — TRIGLYCERIDES: Triglycerides: 128 mg/dL (ref <150)

## 2016-12-13 LAB — HEPARIN LEVEL (UNFRACTIONATED): Heparin Unfractionated: 0.11 IU/mL — ABNORMAL LOW (ref 0.30–0.70)

## 2016-12-13 LAB — AMMONIA: Ammonia: 45 umol/L — ABNORMAL HIGH (ref 9–35)

## 2016-12-13 LAB — FERRITIN: Ferritin: 281 ng/mL (ref 11–307)

## 2016-12-13 LAB — PROTIME-INR
INR: 1.31
Prothrombin Time: 16.4 seconds — ABNORMAL HIGH (ref 11.4–15.2)

## 2016-12-13 LAB — CREATININE, URINE, RANDOM: CREATININE, URINE: 239.58 mg/dL

## 2016-12-13 MED ORDER — ASPIRIN 300 MG RE SUPP
300.0000 mg | Freq: Every day | RECTAL | Status: DC
  Filled 2016-12-13 (×11): qty 1

## 2016-12-13 MED ORDER — ASPIRIN 325 MG PO TABS
325.0000 mg | ORAL_TABLET | Freq: Every day | ORAL | Status: DC
  Administered 2016-12-13 – 2016-12-26 (×13): 325 mg
  Filled 2016-12-13 (×13): qty 1

## 2016-12-13 MED ORDER — HEPARIN BOLUS VIA INFUSION
3000.0000 [IU] | Freq: Once | INTRAVENOUS | Status: AC
  Administered 2016-12-13: 3000 [IU] via INTRAVENOUS
  Filled 2016-12-13: qty 3000

## 2016-12-13 MED ORDER — INSULIN ASPART 100 UNIT/ML ~~LOC~~ SOLN
0.0000 [IU] | SUBCUTANEOUS | Status: DC
  Administered 2016-12-13: 2 [IU] via SUBCUTANEOUS
  Administered 2016-12-13: 1 [IU] via SUBCUTANEOUS
  Administered 2016-12-13: 2 [IU] via SUBCUTANEOUS
  Administered 2016-12-13 – 2016-12-16 (×11): 1 [IU] via SUBCUTANEOUS
  Administered 2016-12-16 (×2): 2 [IU] via SUBCUTANEOUS
  Administered 2016-12-16 – 2016-12-17 (×6): 1 [IU] via SUBCUTANEOUS
  Administered 2016-12-17: 2 [IU] via SUBCUTANEOUS
  Administered 2016-12-17 – 2016-12-18 (×3): 1 [IU] via SUBCUTANEOUS
  Administered 2016-12-18: 2 [IU] via SUBCUTANEOUS
  Administered 2016-12-18 – 2016-12-19 (×3): 1 [IU] via SUBCUTANEOUS
  Administered 2016-12-19 (×2): 2 [IU] via SUBCUTANEOUS
  Administered 2016-12-19: 1 [IU] via SUBCUTANEOUS
  Administered 2016-12-19: 2 [IU] via SUBCUTANEOUS
  Administered 2016-12-20: 1 [IU] via SUBCUTANEOUS
  Administered 2016-12-20 (×3): 2 [IU] via SUBCUTANEOUS
  Administered 2016-12-20 – 2016-12-25 (×12): 1 [IU] via SUBCUTANEOUS
  Administered 2016-12-26: 2 [IU] via SUBCUTANEOUS
  Administered 2016-12-26: 1 [IU] via SUBCUTANEOUS

## 2016-12-13 MED ORDER — MAGNESIUM SULFATE 2 GM/50ML IV SOLN
2.0000 g | Freq: Once | INTRAVENOUS | Status: AC
  Administered 2016-12-13: 2 g via INTRAVENOUS
  Filled 2016-12-13: qty 50

## 2016-12-13 MED ORDER — ORAL CARE MOUTH RINSE
15.0000 mL | Freq: Four times a day (QID) | OROMUCOSAL | Status: DC
  Administered 2016-12-13 – 2016-12-26 (×48): 15 mL via OROMUCOSAL

## 2016-12-13 MED ORDER — IPRATROPIUM-ALBUTEROL 0.5-2.5 (3) MG/3ML IN SOLN
RESPIRATORY_TRACT | Status: AC
  Filled 2016-12-13: qty 3

## 2016-12-13 MED ORDER — IPRATROPIUM-ALBUTEROL 0.5-2.5 (3) MG/3ML IN SOLN
3.0000 mL | Freq: Four times a day (QID) | RESPIRATORY_TRACT | Status: DC
  Administered 2016-12-13 – 2016-12-21 (×36): 3 mL via RESPIRATORY_TRACT
  Filled 2016-12-13 (×35): qty 3

## 2016-12-13 MED ORDER — SODIUM CHLORIDE 0.9 % IV BOLUS (SEPSIS)
1000.0000 mL | Freq: Once | INTRAVENOUS | Status: AC
  Administered 2016-12-13: 1000 mL via INTRAVENOUS

## 2016-12-13 MED ORDER — STROKE: EARLY STAGES OF RECOVERY BOOK
Freq: Once | Status: AC
  Administered 2016-12-13: 01:00:00
  Filled 2016-12-13: qty 1

## 2016-12-13 MED ORDER — CHLORHEXIDINE GLUCONATE 0.12% ORAL RINSE (MEDLINE KIT)
15.0000 mL | Freq: Two times a day (BID) | OROMUCOSAL | Status: DC
  Administered 2016-12-13 – 2016-12-25 (×23): 15 mL via OROMUCOSAL

## 2016-12-13 MED ORDER — HEPARIN (PORCINE) IN NACL 100-0.45 UNIT/ML-% IJ SOLN
900.0000 [IU]/h | INTRAMUSCULAR | Status: DC
  Administered 2016-12-13: 900 [IU]/h via INTRAVENOUS
  Filled 2016-12-13: qty 250

## 2016-12-13 NOTE — Progress Notes (Signed)
  Echocardiogram 2D Echocardiogram has been performed.  Johny Chess 12/13/2016, 2:50 PM

## 2016-12-13 NOTE — Progress Notes (Signed)
ANTICOAGULATION CONSULT NOTE - Initial Consult  Pharmacy Consult for Heparin  Indication: chest pain/ACS  Patient Measurements: Height: 5\' 4"  (162.6 cm) Weight: 167 lb 15.9 oz (76.2 kg) IBW/kg (Calculated) : 54.7  Vital Signs: Temp: 98.1 F (36.7 C) (01/20 0004) Temp Source: Oral (01/20 0004) BP: 116/63 (01/20 0230) Pulse Rate: 88 (01/20 0230)  Labs:  Recent Labs  12/12/16 2003 12/13/16 0019  HGB 10.4*  --   HCT 31.8*  --   PLT 265  --   LABPROT  --  16.4*  INR  --  1.31  CREATININE 3.46*  --   TROPONINI  --  1.25*    Estimated Creatinine Clearance: 19.2 mL/min (by C-G formula based on SCr of 3.46 mg/dL (H)).   Medical History: Past Medical History:  Diagnosis Date  . Allergic rhinitis   . Arthritis   . Asthma   . Back pain   . Bipolar 1 disorder ([place])   . Chronic low back pain 03/06/2014  . COPD (chronic obstructive pulmonary disease) ([place])   . Depression   . Fibroid   . Hypertension   . IBS (irritable bowel syndrome)   . Migraine   . Scoliosis      Assessment: 51 y/o F here with AMS/PNA, starting heparin for elevated troponin, Hgb 10.4, noted renal dysfunction, PTA meds reviewed.   Goal of Therapy:  Heparin level 0.3-0.7 units/ml Monitor platelets by anticoagulation protocol: Yes   Plan:  Heparin 3000 units BOLUS Start heparin drip at 900 units/hr 1100 HL Daily CBC/HL Monitor for bleeding   Narda Bonds 12/13/2016,2:48 AM

## 2016-12-13 NOTE — Consult Note (Signed)
Reason for Consult: ?Stroke Referring Physician: Dr. Chase Caller  [name] is an 51 y.o. female.  HPI: Patient was admitted with dyspnea, pneumonia, respiratory distress, and encephalopathy.  Due to the latter, a CT Brain was obtained, which I reviewed personally.  There is a hypodensity in the left occipital area, which may represent stroke, but can also be artifact.  MRI Brain is pending.  Past Medical History:  Diagnosis Date  . Allergic rhinitis   . Arthritis   . Asthma   . Back pain   . Bipolar 1 disorder ([place])   . Chronic low back pain 03/06/2014  . COPD (chronic obstructive pulmonary disease) ([place])   . Depression   . Fibroid   . Hypertension   . IBS (irritable bowel syndrome)   . Migraine   . Scoliosis     Past Surgical History:  Procedure Laterality Date  . CHOLECYSTECTOMY    . DILATION AND CURETTAGE OF UTERUS    . EXPLORATORY LAPAROTOMY     WHEN PATIENT WAS YOUNG  . RIGHT FOOT SURGERY    . TUBAL LIGATION Bilateral     Family History  Problem Relation Age of Onset  . Stroke Mother   . Hypertension Brother   . Hypertension Brother   . Hypertension Brother   . Allergic rhinitis Neg Hx   . Angioedema Neg Hx   . Asthma Neg Hx   . Atopy Neg Hx   . Eczema Neg Hx   . Immunodeficiency Neg Hx   . Urticaria Neg Hx     Social History:  reports that she has been smoking Cigarettes.  She has been smoking about 0.50 packs per day. She has never used smokeless tobacco. She reports that she does not drink alcohol or use drugs.  Allergies:  Allergies  Allergen Reactions  . Cymbalta [Duloxetine Hcl] Other (See Comments)    Mood changes...mean  . Nyquil Multi-Symptom [Pseudoeph-Doxylamine-Dm-Apap] Hives  . Paxil [Paroxetine Hcl] Other (See Comments)    Mean and aggresive  . Promethazine Itching  . Tramadol Itching  . Wellbutrin [Bupropion] Other (See Comments)    Rapid heartrate  . Zoloft [Sertraline] Other (See Comments)    Anger.    Prior to Admission  medications   Medication Sig Start Date End Date Taking? Authorizing Provider  albuterol (PROAIR HFA) 108 (90 Base) MCG/ACT inhaler Inhale 2 puffs into the lungs every 6 (six) hours as needed for wheezing or shortness of breath. 12/01/16   Shaylar Charmian Muff, MD  albuterol (PROVENTIL) (2.5 MG/3ML) 0.083% nebulizer solution Take 3 mLs (2.5 mg total) by nebulization every 6 (six) hours as needed for wheezing or shortness of breath. 08/19/16   Valentina Shaggy, MD  Azelastine HCl 0.15 % SOLN Place 1 spray into both nostrils 2 (two) times daily. 12/01/16   Shaylar Charmian Muff, MD  beclomethasone (QVAR) 40 MCG/ACT inhaler Inhale 4 puffs into the lungs daily at 12 noon. 08/04/16   Valentina Shaggy, MD  benzonatate (TESSALON PERLES) 100 MG capsule Take 1 capsule (100 mg total) by mouth 3 (three) times daily as needed for cough. 08/14/16   Valentina Shaggy, MD  calcium carbonate (TUMS - DOSED IN MG ELEMENTAL CALCIUM) 500 MG chewable tablet Chew 1 tablet (200 mg of elemental calcium total) by mouth 2 (two) times daily. Patient not taking: Reported on 12/01/2016 12/17/15   Debbe Odea, MD  clonazePAM Bobbye [place]) 1 MG tablet Take by mouth. 04/15/16   Historical Provider, MD  ergocalciferol (VITAMIN D2)  50000 units capsule Take 50,000 Units by mouth once a week. Saturday    Historical Provider, MD  escitalopram (LEXAPRO) 20 MG tablet Take 20 mg by mouth 2 (two) times daily.     Historical Provider, MD  gabapentin (NEURONTIN) 400 MG capsule Take 800 mg by mouth 3 (three) times daily. 842m in the morning, 8063min the evening, and 160086mt bedtime    Historical Provider, MD  ipratropium (ATROVENT) 0.03 % nasal spray 1-2 sprays every 6 hours as needed 12/01/16   Shaylar PatCharmian MuffD  levocetirizine (XYZAL) 5 MG tablet Take 1 tablet (5 mg total) by mouth every evening. 05/14/16   RosGean QuintD  loratadine (CLARITIN) 10 MG tablet Take 1 tablet (10 mg total) by mouth every morning. 12/01/16    Shaylar PatCharmian MuffD  methocarbamol (ROBAXIN) 750 MG tablet Take 1,500 mg by mouth 3 (three) times daily.     Historical Provider, MD  metoprolol tartrate (LOPRESSOR) 25 MG tablet Take 25 mg by mouth 2 (two) times daily. Reported on 02/21/2016    Historical Provider, MD  mometasone-formoterol (DULERA) 200-5 MCG/ACT AERO Inhale 2 puffs into the lungs 2 (two) times daily. 08/04/16   JoeValentina ShaggyD  ondansetron (ZOFRAN) 4 MG tablet Take 4 mg by mouth every 8 (eight) hours as needed for nausea or vomiting.    Historical Provider, MD  oxyCODONE (ROXICODONE) 15 MG immediate release tablet Take 15 mg by mouth every 4 (four) hours as needed for pain.    Historical Provider, MD  QUEtiapine (SEROQUEL) 50 MG tablet Take 50 mg by mouth at bedtime. Reported on 05/14/2016    Historical Provider, MD  rosuvastatin (CRESTOR) 10 MG tablet Take 10 mg by mouth daily. Reported on 05/14/2016    Historical Provider, MD  Spacer/Aero-Holding Chambers (AEROCHAMBER PLUS FLO-VU MEDIUM) MISC Use with metered dose inhaler 03/27/16   Roselyn M HMalachy MoanD  tiotropium (SPIRIVA HANDIHALER) 18 MCG inhalation capsule Place 1 capsule (18 mcg total) into inhaler and inhale daily. 12/01/16   ShaObetzD    Medications:  Scheduled: . aspirin  300 mg Rectal Daily   Or  . aspirin  325 mg Per Tube Daily  . azithromycin (ZITHROMAX) 500 MG IVPB  500 mg Intravenous Q24H  . cefTRIAXone (ROCEPHIN)  IV  1 g Intravenous Q24H  . chlorhexidine gluconate (MEDLINE KIT)  15 mL Mouth Rinse BID  . famotidine (PEPCID) IV  20 mg Intravenous Q24H  . insulin aspart  0-9 Units Subcutaneous Q4H  . ipratropium-albuterol  3 mL Nebulization Q6H  . mouth rinse  15 mL Mouth Rinse QID  . methylPREDNISolone (SOLU-MEDROL) injection  60 mg Intravenous Q24H    Results for orders placed or performed during the hospital encounter of 12/12/16 (from the past 48 hour(s))  Urinalysis, Routine w reflex microscopic     Status: Abnormal    Collection Time: 12/12/16 12:32 AM  Result Value Ref Range   Color, Urine AMBER (A) YELLOW    Comment: BIOCHEMICALS MAY BE AFFECTED BY COLOR   APPearance HAZY (A) CLEAR   Specific Gravity, Urine 1.020 1.005 - 1.030   pH 5.0 5.0 - 8.0   Glucose, UA NEGATIVE NEGATIVE mg/dL   Hgb urine dipstick LARGE (A) NEGATIVE   Bilirubin Urine NEGATIVE NEGATIVE   Ketones, ur NEGATIVE NEGATIVE mg/dL   Protein, ur 30 (A) NEGATIVE mg/dL   Nitrite NEGATIVE NEGATIVE   Leukocytes, UA NEGATIVE NEGATIVE   RBC / HPF 0-5 0 -  5 RBC/hpf   WBC, UA 0-5 0 - 5 WBC/hpf   Bacteria, UA RARE (A) NONE SEEN   Squamous Epithelial / LPF 0-5 (A) NONE SEEN   Hyaline Casts, UA PRESENT   Urine rapid drug screen (hosp performed)     Status: Abnormal   Collection Time: 12/12/16 12:32 AM  Result Value Ref Range   Opiates POSITIVE (A) NONE DETECTED   Cocaine NONE DETECTED NONE DETECTED   Benzodiazepines POSITIVE (A) NONE DETECTED   Amphetamines NONE DETECTED NONE DETECTED   Tetrahydrocannabinol NONE DETECTED NONE DETECTED   Barbiturates NONE DETECTED NONE DETECTED    Comment:        DRUG SCREEN FOR MEDICAL PURPOSES ONLY.  IF CONFIRMATION IS NEEDED FOR ANY PURPOSE, NOTIFY LAB WITHIN 5 DAYS.        LOWEST DETECTABLE LIMITS FOR URINE DRUG SCREEN Drug Class       Cutoff (ng/mL) Amphetamine      1000 Barbiturate      200 Benzodiazepine   638 Tricyclics       453 Opiates          300 Cocaine          300 THC              50   Sodium, urine, random     Status: None   Collection Time: 12/12/16 12:33 AM  Result Value Ref Range   Sodium, Ur <10 mmol/L    Comment: REPEATED TO VERIFY  Creatinine, urine, random     Status: None   Collection Time: 12/12/16 12:33 AM  Result Value Ref Range   Creatinine, Urine 239.58 mg/dL  Comprehensive metabolic panel     Status: Abnormal   Collection Time: 12/12/16  8:03 PM  Result Value Ref Range   Sodium 131 (L) 135 - 145 mmol/L   Potassium 4.7 3.5 - 5.1 mmol/L   Chloride 94 (L) 101  - 111 mmol/L   CO2 21 (L) 22 - 32 mmol/L   Glucose, Bld 127 (H) 65 - 99 mg/dL   BUN 44 (H) 6 - 20 mg/dL   Creatinine, Ser 3.46 (H) 0.44 - 1.00 mg/dL   Calcium 8.5 (L) 8.9 - 10.3 mg/dL   Total Protein 6.0 (L) 6.5 - 8.1 g/dL   Albumin 3.3 (L) 3.5 - 5.0 g/dL   AST 2,024 (H) 15 - 41 U/L   ALT 1,366 (H) 14 - 54 U/L   Alkaline Phosphatase 140 (H) 38 - 126 U/L   Total Bilirubin 1.2 0.3 - 1.2 mg/dL   GFR calc non Af Amer 14 (L) >60 mL/min   GFR calc Af Amer 17 (L) >60 mL/min    Comment: (NOTE) The eGFR has been calculated using the CKD EPI equation. This calculation has not been validated in all clinical situations. eGFR's persistently <60 mL/min signify possible Chronic Kidney Disease.    Anion gap 16 (H) 5 - 15  CBC with Differential     Status: Abnormal   Collection Time: 12/12/16  8:03 PM  Result Value Ref Range   WBC 19.2 (H) 4.0 - 10.5 K/uL   RBC 3.54 (L) 3.87 - 5.11 MIL/uL   Hemoglobin 10.4 (L) 12.0 - 15.0 g/dL   HCT 31.8 (L) 36.0 - 46.0 %   MCV 89.8 78.0 - 100.0 fL   MCH 29.4 26.0 - 34.0 pg   MCHC 32.7 30.0 - 36.0 g/dL   RDW 15.1 11.5 - 15.5 %   Platelets 265 150 - 400  K/uL   Neutrophils Relative % 89 %   Neutro Abs 17.2 (H) 1.7 - 7.7 K/uL   Lymphocytes Relative 5 %   Lymphs Abs 0.9 0.7 - 4.0 K/uL   Monocytes Relative 6 %   Monocytes Absolute 1.1 (H) 0.1 - 1.0 K/uL   Eosinophils Relative 0 %   Eosinophils Absolute 0.0 0.0 - 0.7 K/uL   Basophils Relative 0 %   Basophils Absolute 0.0 0.0 - 0.1 K/uL  Culture, blood (routine x 2)     Status: None (Preliminary result)   Collection Time: 12/12/16  8:07 PM  Result Value Ref Range   Specimen Description BLOOD RIGHT HAND    Special Requests BOTTLES DRAWN AEROBIC AND ANAEROBIC 5CC    Culture NO GROWTH < 12 HOURS    Report Status PENDING   I-Stat CG4 Lactic Acid, ED     Status: None   Collection Time: 12/12/16  8:28 PM  Result Value Ref Range   Lactic Acid, Venous 1.30 0.5 - 1.9 mmol/L  Culture, blood (routine x 2)      Status: None (Preliminary result)   Collection Time: 12/12/16  8:43 PM  Result Value Ref Range   Specimen Description BLOOD LEFT FOREARM    Special Requests BOTTLES DRAWN AEROBIC AND ANAEROBIC 5CC    Culture NO GROWTH < 12 HOURS    Report Status PENDING   Lactic acid, plasma     Status: None   Collection Time: 12/12/16  8:43 PM  Result Value Ref Range   Lactic Acid, Venous 1.1 0.5 - 1.9 mmol/L  Respiratory Panel by PCR     Status: None   Collection Time: 12/13/16 12:01 AM  Result Value Ref Range   Adenovirus NOT DETECTED NOT DETECTED   Coronavirus 229E NOT DETECTED NOT DETECTED   Coronavirus HKU1 NOT DETECTED NOT DETECTED   Coronavirus NL63 NOT DETECTED NOT DETECTED   Coronavirus OC43 NOT DETECTED NOT DETECTED   Metapneumovirus NOT DETECTED NOT DETECTED   Rhinovirus / Enterovirus NOT DETECTED NOT DETECTED   Influenza A NOT DETECTED NOT DETECTED   Influenza B NOT DETECTED NOT DETECTED   Parainfluenza Virus 1 NOT DETECTED NOT DETECTED   Parainfluenza Virus 2 NOT DETECTED NOT DETECTED   Parainfluenza Virus 3 NOT DETECTED NOT DETECTED   Parainfluenza Virus 4 NOT DETECTED NOT DETECTED   Respiratory Syncytial Virus NOT DETECTED NOT DETECTED   Bordetella pertussis NOT DETECTED NOT DETECTED   Chlamydophila pneumoniae NOT DETECTED NOT DETECTED   Mycoplasma pneumoniae NOT DETECTED NOT DETECTED  MRSA PCR Screening     Status: None   Collection Time: 12/13/16 12:03 AM  Result Value Ref Range   MRSA by PCR NEGATIVE NEGATIVE    Comment:        The GeneXpert MRSA Assay (FDA approved for NASAL specimens only), is one component of a comprehensive MRSA colonization surveillance program. It is not intended to diagnose MRSA infection nor to guide or monitor treatment for MRSA infections.   Culture, respiratory (NON-Expectorated)     Status: None (Preliminary result)   Collection Time: 12/13/16 12:14 AM  Result Value Ref Range   Specimen Description TRACHEAL ASPIRATE    Special Requests  NONE    Gram Stain      MODERATE WBC PRESENT, PREDOMINANTLY PMN RARE SQUAMOUS EPITHELIAL CELLS PRESENT ABUNDANT GRAM POSITIVE COCCI IN PAIRS ABUNDANT GRAM NEGATIVE COCCOBACILLI MODERATE GRAM POSITIVE RODS    Culture PENDING    Report Status PENDING   Glucose, capillary  Status: Abnormal   Collection Time: 12/13/16 12:15 AM  Result Value Ref Range   Glucose-Capillary 172 (H) 65 - 99 mg/dL   Comment 1 Notify RN    Comment 2 Document in Chart   Strep pneumoniae urinary antigen  (not at [facility])     Status: Abnormal   Collection Time: 12/13/16 12:16 AM  Result Value Ref Range   Strep Pneumo Urinary Antigen POSITIVE (A) NEGATIVE  Triglycerides     Status: None   Collection Time: 12/13/16 12:19 AM  Result Value Ref Range   Triglycerides 128 <150 mg/dL  Acetaminophen level     Status: Abnormal   Collection Time: 12/13/16 12:19 AM  Result Value Ref Range   Acetaminophen (Tylenol), Serum <10 (L) 10 - 30 ug/mL    Comment:        THERAPEUTIC CONCENTRATIONS VARY SIGNIFICANTLY. A RANGE OF 10-30 ug/mL MAY BE AN EFFECTIVE CONCENTRATION FOR MANY PATIENTS. HOWEVER, SOME ARE BEST TREATED AT CONCENTRATIONS OUTSIDE THIS RANGE. ACETAMINOPHEN CONCENTRATIONS >150 ug/mL AT 4 HOURS AFTER INGESTION AND >50 ug/mL AT 12 HOURS AFTER INGESTION ARE OFTEN ASSOCIATED WITH TOXIC REACTIONS.   Ammonia     Status: Abnormal   Collection Time: 12/13/16 12:19 AM  Result Value Ref Range   Ammonia 45 (H) 9 - 35 umol/L  Protime-INR     Status: Abnormal   Collection Time: 12/13/16 12:19 AM  Result Value Ref Range   Prothrombin Time 16.4 (H) 11.4 - 15.2 seconds   INR 1.31   Troponin I (q 6hr x 3)     Status: Abnormal   Collection Time: 12/13/16 12:19 AM  Result Value Ref Range   Troponin I 1.25 (HH) <0.03 ng/mL    Comment: CRITICAL RESULT CALLED TO, READ BACK BY AND VERIFIED WITH: TURNER D,RN 12/13/16 0131 WAYK   Blood gas, arterial     Status: Abnormal   Collection Time: 12/13/16  1:00 AM  Result  Value Ref Range   FIO2 60.00    Delivery systems VENTILATOR    Mode PRESSURE REGULATED VOLUME CONTROL    VT 420 mL   LHR 16 resp/min   Peep/cpap 5.0 cm H20   pH, Arterial 7.305 (L) 7.350 - 7.450   pCO2 arterial 51.8 (H) 32.0 - 48.0 mmHg   pO2, Arterial 110 (H) 83.0 - 108.0 mmHg   Bicarbonate 25.0 20.0 - 28.0 mmol/L   Acid-base deficit 0.5 0.0 - 2.0 mmol/L   O2 Saturation 96.8 %   Patient temperature 98.6    Collection site LEFT RADIAL    Drawn by [phone]    Sample type ARTERIAL DRAW    Allens test (pass/fail) PASS PASS  CBC     Status: Abnormal   Collection Time: 12/13/16  6:13 AM  Result Value Ref Range   WBC 20.0 (H) 4.0 - 10.5 K/uL   RBC 2.95 (L) 3.87 - 5.11 MIL/uL   Hemoglobin 8.6 (L) 12.0 - 15.0 g/dL   HCT 26.6 (L) 36.0 - 46.0 %   MCV 90.2 78.0 - 100.0 fL   MCH 29.2 26.0 - 34.0 pg   MCHC 32.3 30.0 - 36.0 g/dL   RDW 15.4 11.5 - 15.5 %   Platelets 224 150 - 400 K/uL  Basic metabolic panel     Status: Abnormal   Collection Time: 12/13/16  6:13 AM  Result Value Ref Range   Sodium 135 135 - 145 mmol/L   Potassium 4.1 3.5 - 5.1 mmol/L   Chloride 101 101 - 111 mmol/L  CO2 24 22 - 32 mmol/L   Glucose, Bld 185 (H) 65 - 99 mg/dL   BUN 30 (H) 6 - 20 mg/dL   Creatinine, Ser 1.54 (H) 0.44 - 1.00 mg/dL    Comment: DELTA CHECK NOTED   Calcium 7.9 (L) 8.9 - 10.3 mg/dL   GFR calc non Af Amer 38 (L) >60 mL/min   GFR calc Af Amer 44 (L) >60 mL/min    Comment: (NOTE) The eGFR has been calculated using the CKD EPI equation. This calculation has not been validated in all clinical situations. eGFR's persistently <60 mL/min signify possible Chronic Kidney Disease.    Anion gap 10 5 - 15  Magnesium     Status: None   Collection Time: 12/13/16  6:13 AM  Result Value Ref Range   Magnesium 1.8 1.7 - 2.4 mg/dL  Phosphorus     Status: None   Collection Time: 12/13/16  6:13 AM  Result Value Ref Range   Phosphorus 2.9 2.5 - 4.6 mg/dL  Troponin I (q 6hr x 3)     Status: Abnormal    Collection Time: 12/13/16  6:13 AM  Result Value Ref Range   Troponin I 0.97 (HH) <0.03 ng/mL    Comment: CRITICAL VALUE NOTED.  VALUE IS CONSISTENT WITH PREVIOUSLY REPORTED AND CALLED VALUE.  Lipid panel     Status: Abnormal   Collection Time: 12/13/16  6:13 AM  Result Value Ref Range   Cholesterol 113 0 - 200 mg/dL   Triglycerides 127 <150 mg/dL   HDL 36 (L) >40 mg/dL   Total CHOL/HDL Ratio 3.1 RATIO   VLDL 25 0 - 40 mg/dL   LDL Cholesterol 52 0 - 99 mg/dL    Comment:        Total Cholesterol/HDL:CHD Risk Coronary Heart Disease Risk Table                     Men   Women  1/2 Average Risk   3.4   3.3  Average Risk       5.0   4.4  2 X Average Risk   9.6   7.1  3 X Average Risk  23.4   11.0        Use the calculated Patient Ratio above and the CHD Risk Table to determine the patient's CHD Risk.        ATP III CLASSIFICATION (LDL):  <100     mg/dL   Optimal  100-129  mg/dL   Near or Above                    Optimal  130-159  mg/dL   Borderline  160-189  mg/dL   High  >190     mg/dL   Very High   Procalcitonin - Baseline     Status: None   Collection Time: 12/13/16  6:13 AM  Result Value Ref Range   Procalcitonin 1.85 ng/mL    Comment:        Interpretation: PCT > 0.5 ng/mL and <= 2 ng/mL: Systemic infection (sepsis) is possible, but other conditions are known to elevate PCT as well. (NOTE)         ICU PCT Algorithm               Non ICU PCT Algorithm    ----------------------------     ------------------------------         PCT < 0.25 ng/mL  PCT < 0.1 ng/mL     Stopping of antibiotics            Stopping of antibiotics       strongly encouraged.               strongly encouraged.    ----------------------------     ------------------------------       PCT level decrease by               PCT < 0.25 ng/mL       >= 80% from peak PCT       OR PCT 0.25 - 0.5 ng/mL          Stopping of antibiotics                                             encouraged.      Stopping of antibiotics           encouraged.    ----------------------------     ------------------------------       PCT level decrease by              PCT >= 0.25 ng/mL       < 80% from peak PCT        AND PCT >= 0.5 ng/mL             Continuing antibiotics                                              encouraged.       Continuing antibiotics            encouraged.    ----------------------------     ------------------------------     PCT level increase compared          PCT > 0.5 ng/mL         with peak PCT AND          PCT >= 0.5 ng/mL             Escalation of antibiotics                                          strongly encouraged.      Escalation of antibiotics        strongly encouraged.   CK     Status: Abnormal   Collection Time: 12/13/16  6:13 AM  Result Value Ref Range   Total CK 3,734 (H) 38 - 234 U/L  Glucose, capillary     Status: Abnormal   Collection Time: 12/13/16  8:53 AM  Result Value Ref Range   Glucose-Capillary 182 (H) 65 - 99 mg/dL   Comment 1 Notify RN    Comment 2 Document in Chart   Troponin I (q 6hr x 3)     Status: Abnormal   Collection Time: 12/13/16 10:23 AM  Result Value Ref Range   Troponin I 0.93 (HH) <0.03 ng/mL    Comment: CRITICAL VALUE NOTED.  VALUE IS CONSISTENT WITH PREVIOUSLY REPORTED AND CALLED VALUE.  Heparin level (unfractionated)     Status: Abnormal   Collection Time: 12/13/16 10:23 AM  Result Value Ref Range   Heparin Unfractionated 0.11 (L) 0.30 - 0.70 IU/mL    Comment:        IF HEPARIN RESULTS ARE BELOW EXPECTED VALUES, AND PATIENT DOSAGE HAS BEEN CONFIRMED, SUGGEST FOLLOW UP TESTING OF ANTITHROMBIN III LEVELS.   Ferritin     Status: None   Collection Time: 12/13/16 10:23 AM  Result Value Ref Range   Ferritin 281 11 - 307 ng/mL  Glucose, capillary     Status: Abnormal   Collection Time: 12/13/16 11:27 AM  Result Value Ref Range   Glucose-Capillary 162 (H) 65 - 99 mg/dL   Comment 1 Notify RN    Comment 2 Document  in Chart     Ct Head Wo Contrast  Result Date: 12/13/2016 CLINICAL DATA:  51 y/o  F; hypoxia and altered mental status. EXAM: CT HEAD WITHOUT CONTRAST TECHNIQUE: Contiguous axial images were obtained from the base of the skull through the vertex without intravenous contrast. COMPARISON:  11/12/2007 CT head. FINDINGS: Brain: Wedge-shaped focus of hypoattenuation with loss of gray-white differentiation involving the left occipital lobe may represent an acute or subacute infarction (series 2, image 10 and series 4, image 57). Chronic left inferior frontal infarct. Mild chronic microvascular ischemic changes and parenchymal volume loss of the brain. No acute hemorrhage, hydrocephalus, or extra-axial collection is identified. Vascular: No hyperdense vessel. Skull: Normal. Negative for fracture or focal lesion. Sinuses/Orbits: No acute finding. Other: None. IMPRESSION: Left occipital lobe probable acute or early subacute infarction. No acute intracranial hemorrhage. Critical Value/emergent results were called by telephone at the time of interpretation on 12/13/2016 at 12:07 am to [facility], who verbally acknowledged these results. Electronically Signed   By: Kristine Garbe M.D.   On: 12/13/2016 00:04   Dg Chest Port 1 View  Result Date: 12/13/2016 CLINICAL DATA:  Evaluate for lung collapse. EXAM: PORTABLE CHEST 1 VIEW COMPARISON:  December 12, 2016 FINDINGS: There has been interval improvement on the left. While there is still opacity throughout the left mid and lower lung, the more focal infiltrate in the left base is much improved. Minimal opacity in the right base. No other interval change. ET and NG tubes are in good position. IMPRESSION: 1. Persistent bilateral pulmonary opacities, greater on the left than the right. The more focal component of opacity in the left base is improved however. Recommend follow-up to complete resolution. Electronically Signed   By: Dorise Bullion III M.D   On:  12/13/2016 12:24   Dg Chest Portable 1 View  Result Date: 12/12/2016 CLINICAL DATA:  Post intubation EXAM: PORTABLE CHEST 1 VIEW COMPARISON:  Chest x-ray from earlier same day. FINDINGS: Endotracheal tube well positioned with tip approximately 2 cm above the carina. Nasogastric tube passes below the diaphragm. Dense opacity at the left lung base is unchanged in extent although denser on the present exam. The milder patchy airspace opacities at the right lung base are unchanged. Cardiomediastinal silhouette appears stable. IMPRESSION: 1. Endotracheal tube well positioned with tip just above the level of the carina. 2. Enteric tube passes below the diaphragm. 3. Large dense opacity at the left lung base, stable in extent but increased in density compared to the chest x-ray from earlier today suggesting airspace collapse, increasingly confluent edema and/or increasing pleural effusion. Suspect additional mild edema at the right lung base, stable. Electronically Signed   By: Franki Cabot M.D.   On: 12/12/2016 21:42   Dg Chest Portable 1 View  Result Date: 12/12/2016 CLINICAL DATA:  Dyspnea EXAM: PORTABLE CHEST 1 VIEW COMPARISON:  CXR 08/14/2016, chest CT 12/12/2015 FINDINGS: Patchy airspace opacities are seen along the periphery of the left lung and right lung base. Multilobar pneumonia might account for this appearance. Asymmetric pulmonary edema would be less common. Heart is borderline enlarged. Slightly more prominent aorta likely due to patient rotation. No acute osseous abnormality. Small left effusion is not entirely excluded. IMPRESSION: Pulmonary consolidations noted bilaterally which may reflect multilobar pneumonia involving both lung bases, left upper lobe and lingula. Cannot exclude a small left effusion given hazy opacity at the left lung base. Electronically Signed   By: Ashley Royalty M.D.   On: 12/12/2016 20:05   Dg Abd Portable 1v  Result Date: 12/13/2016 CLINICAL DATA:  Feeding tube  placement EXAM: PORTABLE ABDOMEN - 1 VIEW COMPARISON:  12/12/2016 FINDINGS: Nasogastric tube tip is in the distal body of stomach. Side port is well below the GE junction. Cholecystectomy clips noted within the right upper quadrant of the abdomen. IMPRESSION: 1. Satisfactory position of nasogastric tube. Electronically Signed   By: Kerby Moors M.D.   On: 12/13/2016 11:05   US Abdomen Limited Ruq  Result Date: 12/13/2016 CLINICAL DATA:  Transaminitis. EXAM: US ABDOMEN LIMITED - RIGHT UPPER QUADRANT COMPARISON:  09/18/2013 FINDINGS: Gallbladder: Surgically absent. Common bile duct: Diameter: 6.2 mm, normal. Liver: No focal lesion identified. Within normal limits in parenchymal echogenicity. Normal directional flow in the main portal vein. IMPRESSION: Postcholecystectomy without biliary dilatation. Unremarkable sonographic appearance of the liver. Electronically Signed   By: Jeb Levering M.D.   On: 12/13/2016 03:33    ROS Blood pressure (!) 141/76, pulse 92, temperature 99.3 F (37.4 C), temperature source Oral, resp. rate 14, height _0  (1.626 m), weight 76.2 kg (167 lb 15.9 oz), SpO2 96 %.   Neurologic Examination: Intubated,  I stopped Propofol 50 mcg/kg/min gtt about 5 min prior to exam.  She wakes up and opens eyes.  Nodds to basic questions. Withdraws to pain in all 4 extremities.   She will not follow with eyes to command. She does not cooperate with visual field testing.  Assessment/Plan:  Incidental finding of left occipital hypodensity on CT.  History was not suggestive of stroke, and it may be artifactual.  I cannot get an accurate visual field neurological exam on her at this time.  I will await MRI Brain to review and will give further recommendations after that.     Rogue Jury, MD 12/13/2016, 2:04 PM

## 2016-12-13 NOTE — Progress Notes (Signed)
Initial Nutrition Assessment  DOCUMENTATION CODES:   Not applicable  INTERVENTION:  - If pt to remain intubated >/= 24 hours, recommend Vital High Protein @ 45 mL/hr via OGT. This regimen + kcal from current Propofol rate will provide 1685 kcal, 95 grams of protein, and 903 mL free water.  - RD will follow-up 1/22.  NUTRITION DIAGNOSIS:   Inadequate oral intake related to inability to eat as evidenced by NPO status.  GOAL:   Patient will meet greater than or equal to 90% of their needs  MONITOR:   Vent status, Weight trends, Labs, I & O's  REASON FOR ASSESSMENT:   Ventilator  ASSESSMENT:   45 pack year smoking history and asthma, allergic rhinitis, COPD, HTN, Bipolar d/o presenting with dyspnea found to have acute hypoxic respiratory failure and multilobar pneumonia on CXR. History obtained from daughter and EMR due to acute encephalopathy. She has been feeling poorly for the past 2-3 days with fatigue, malaise, lethargy, subjective fevers, chills, myalgias, confusion. She has a cough productive of yellow green sputum. She has dyspnea. She has lower midsternal chest pain/epigastric pain. She has nausea but no vomiting.  Pt seen for new vent. BMI indicates overweight status. No family/visitors present. OGT in place at this time. Per notes, pt with severe, recurrent asthma with co-existing COPD PTA.   Physical assessment showed no muscle and no fat wasting at this time. Currently without edema to extremities but noted that she did have mild generalized edema yesterday. Per chart review, weight has been stable since September 2017.   Patient is currently intubated on ventilator support MV: 8.6 L/min Temp (24hrs), Avg:99 F (37.2 C), Min:98.1 F (36.7 C), Max:99.7 F (37.6 C) Propofol: 22.9 ml/hr (605 kcal).   Medications reviewed; 20 mg IV Pepcid/day, sliding scale Novolog, 60 mg IV Solu-medrol/day. Labs reviewed; BUN: 30 mg/dL, creatinine: 1.54 mg/dL, Ca: 7.9 mg/dL, GFR: 38  mL/min.   IVF: NS @ 150 mL/hr. Drips: Propofol @ 50 mcg/kg/min, Heparin @ 900 units/hr.    Diet Order:  Diet NPO time specified  Skin:  Reviewed, no issues  Last BM:  PTA/unknown  Height:   Ht Readings from Last 1 Encounters:  12/13/16 5\' 4"  (1.626 m)    Weight:   Wt Readings from Last 1 Encounters:  12/13/16 167 lb 15.9 oz (76.2 kg)    Ideal Body Weight:  54.54 kg  BMI:  Body mass index is 28.84 kg/m.  Estimated Nutritional Needs:   Kcal:  [id number]  Protein:  91-114 grams (1.2-1.5 grams/kg)  Fluid:  1.7-2 L/day  EDUCATION NEEDS:   No education needs identified at this time    Jarome Matin, MS, RD, LDN, CNSC Inpatient Clinical Dietitian Pager # [phone] After hours/weekend pager # [phone]

## 2016-12-13 NOTE — Progress Notes (Signed)
Preliminary results by tech - Carotid Duplex Completed. Mild plaque in bilateral carotid arteries without significant stenosis noted. Vertebral arteries demonstrated antegrade flow. Cordai Rodrigue, BS, RDMS, RVT   

## 2016-12-14 ENCOUNTER — Inpatient Hospital Stay (HOSPITAL_COMMUNITY): Payer: Medicaid Other

## 2016-12-14 LAB — BASIC METABOLIC PANEL
ANION GAP: 9 (ref 5–15)
BUN: 18 mg/dL (ref 6–20)
CHLORIDE: 106 mmol/L (ref 101–111)
CO2: 22 mmol/L (ref 22–32)
Calcium: 8.1 mg/dL — ABNORMAL LOW (ref 8.9–10.3)
Creatinine, Ser: 0.75 mg/dL (ref 0.44–1.00)
GFR calc non Af Amer: >60 mL/min (ref >60)
GLUCOSE: 118 mg/dL — AB (ref 65–99)
POTASSIUM: 4.6 mmol/L (ref 3.5–5.1)
Sodium: 137 mmol/L (ref 135–145)

## 2016-12-14 LAB — VAS US CAROTID
LEFT ECA DIAS: -40 cm/s
LEFT VERTEBRAL DIAS: -15 cm/s
Left CCA dist dias: 24 cm/s
Left CCA dist sys: 99 cm/s
Left CCA prox dias: 26 cm/s
Left CCA prox sys: 108 cm/s
Left ICA dist dias: -28 cm/s
Left ICA dist sys: -94 cm/s
Left ICA prox dias: -35 cm/s
Left ICA prox sys: -88 cm/s
RCCAPDIAS: 22 cm/s
RIGHT ECA DIAS: -28 cm/s
RIGHT VERTEBRAL DIAS: 26 cm/s
Right CCA prox sys: 137 cm/s
Right cca dist sys: -81 cm/s

## 2016-12-14 LAB — GLUCOSE, CAPILLARY
GLUCOSE-CAPILLARY: 137 mg/dL — AB (ref 65–99)
Glucose-Capillary: 141 mg/dL — ABNORMAL HIGH (ref 65–99)
Glucose-Capillary: 138 mg/dL — ABNORMAL HIGH (ref 65–99)
Glucose-Capillary: 137 mg/dL — ABNORMAL HIGH (ref 65–99)
Glucose-Capillary: 130 mg/dL — ABNORMAL HIGH (ref 65–99)

## 2016-12-14 LAB — HEMOGLOBIN A1C
Hgb A1c MFr Bld: 5.4 % (ref 4.8–5.6)
Mean Plasma Glucose: 108 mg/dL

## 2016-12-14 LAB — CBC WITH DIFFERENTIAL/PLATELET
BASOS ABS: 0 10*3/uL (ref 0.0–0.1)
BASOS PCT: 0 %
EOS PCT: 0 %
Eosinophils Absolute: 0 10*3/uL (ref 0.0–0.7)
HCT: 26.7 % — ABNORMAL LOW (ref 36.0–46.0)
Hemoglobin: 8.7 g/dL — ABNORMAL LOW (ref 12.0–15.0)
LYMPHS PCT: 4 %
Lymphs Abs: 0.7 10*3/uL (ref 0.7–4.0)
MCH: 29.7 pg (ref 26.0–34.0)
MCHC: 32.6 g/dL (ref 30.0–36.0)
MCV: 91.1 fL (ref 78.0–100.0)
Monocytes Absolute: 0.2 10*3/uL (ref 0.1–1.0)
Monocytes Relative: 1 %
Neutro Abs: 16.9 10*3/uL — ABNORMAL HIGH (ref 1.7–7.7)
Neutrophils Relative %: 95 %
PLATELETS: 205 10*3/uL (ref 150–400)
RBC: 2.93 MIL/uL — AB (ref 3.87–5.11)
RDW: 15.5 % (ref 11.5–15.5)
WBC: 17.8 10*3/uL — AB (ref 4.0–10.5)

## 2016-12-14 LAB — HEPATIC FUNCTION PANEL
ALBUMIN: 2.4 g/dL — AB (ref 3.5–5.0)
ALK PHOS: 112 U/L (ref 38–126)
ALT: 704 U/L — ABNORMAL HIGH (ref 14–54)
AST: 314 U/L — ABNORMAL HIGH (ref 15–41)
BILIRUBIN INDIRECT: 0.3 mg/dL (ref 0.3–0.9)
Bilirubin, Direct: 0.2 mg/dL (ref 0.1–0.5)
Total Bilirubin: 0.5 mg/dL (ref 0.3–1.2)
Total Protein: 5.2 g/dL — ABNORMAL LOW (ref 6.5–8.1)

## 2016-12-14 LAB — HEPATITIS PANEL, ACUTE
HEP A IGM: NEGATIVE
HEP B C IGM: NEGATIVE
HEP B S AG: NEGATIVE

## 2016-12-14 LAB — PROTIME-INR
INR: 1.21
PROTHROMBIN TIME: 15.4 s — AB (ref 11.4–15.2)

## 2016-12-14 LAB — CK TOTAL AND CKMB (NOT AT ARMC)
CK TOTAL: 2095 U/L — AB (ref 38–234)
CK, MB: 21.4 ng/mL — AB (ref 0.5–5.0)
RELATIVE INDEX: 1 (ref 0.0–2.5)

## 2016-12-14 LAB — MAGNESIUM: MAGNESIUM: 2.4 mg/dL (ref 1.7–2.4)

## 2016-12-14 LAB — PROCALCITONIN: PROCALCITONIN: 1.05 ng/mL

## 2016-12-14 LAB — PHOSPHORUS: PHOSPHORUS: 2.4 mg/dL — AB (ref 2.5–4.6)

## 2016-12-14 MED ORDER — FUROSEMIDE 10 MG/ML IJ SOLN
20.0000 mg | Freq: Two times a day (BID) | INTRAMUSCULAR | Status: DC
  Administered 2016-12-14 – 2016-12-17 (×7): 20 mg via INTRAVENOUS
  Filled 2016-12-14 (×10): qty 2

## 2016-12-14 NOTE — Progress Notes (Signed)
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  There is no left occipital infarct.  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   TTE and CDUS were normal earlier.    Cont ASA PR 300 mg qd.  No further work up necessary at this time.    Rogue Jury, MD

## 2016-12-14 NOTE — Progress Notes (Signed)
PULMONARY / CRITICAL CARE MEDICINE   Name: [name] MRN: [id number] DOB: [birth year]    ADMISSION DATE:  12/12/2016 CONSULTATION DATE:  12/12/2016  REFERRING MD:  Alvino Chapel, MD (EDP)  CHIEF COMPLAINT:  Asthma exacerbation  HISTORY OF PRESENT ILLNESS:   51 year old woman with 45 pack year smoking history and asthma, allergic rhinitis, COPD, HTN, Bipolar d/o presenting with dyspnea found to have acute hypoxic respiratory failure and multilobar pneumonia on CXR. History obtained from daughter and EMR due to acute encephalopathy. She has been feeling poorly for the past 2-3 days with fatigue, malaise, lethargy, subjective fevers, chills, myalgias, confusion. She has a cough productive of yellow green sputum. She has dyspnea. She has lower midsternal chest pain/epigastric pain. She has nausea but no vomiting. No diarrhea, dysuria or hematuria. Possible focal weakness with not lifting her L arm.  O2 sat was 60s upon EMS arrival. She was given albuterol and Duoneb by EMS. She was placed on BiPAP. Was noted to be alert but very lethargic. She was intubated in the ED. She was started on ceftriaxone and azithromycin.   SUBJECTIVE:  Sedated and intubated  VITAL SIGNS: BP 136/87   Pulse 96   Temp 99.8 F (37.7 C) (Oral)   Resp (!) 21   Ht 5\' 4"  (1.626 m)   Wt 169 lb 8.5 oz (76.9 kg)   SpO2 96%   BMI 29.10 kg/m   HEMODYNAMICS:    VENTILATOR SETTINGS: Vent Mode: PRVC FiO2 (%):  [40 %-50 %] 40 % Set Rate:  [16 bmp] 16 bmp Vt Set:  [420 mL] 420 mL PEEP:  [5 cmH20] 5 cmH20 Plateau Pressure:  [10 [id number] cmH20] 14 cmH20  INTAKE / OUTPUT: I/O last 3 completed shifts: In: 7310.5 [I.V.:5375.5; NG/GT:185; IV Piggyback:1750] Out: 3085 [Urine:2795; Emesis/NG output:290]  PHYSICAL EXAMINATION: General:  NAD Neuro:  Sedated on vent HEENT:  PERRL bilaterally, dry mucous membranes Cardiovascular:  RRR Lungs:  Wheezes through bilateral lung fields Abdomen:  Soft, nontender,  nondistended Musculoskeletal:  No LE edema Skin:  Warm and well perfused  LABS:  BMET  Recent Labs Lab 12/12/16 2003 12/13/16 0613 12/14/16 0022  NA 131* 135 137  K 4.7 4.1 4.6  CL 94* 101 106  CO2 21* 24 22  BUN 44* 30* 18  CREATININE 3.46* 1.54* 0.75  GLUCOSE 127* 185* 118*    Electrolytes  Recent Labs Lab 12/12/16 2003 12/13/16 0613 12/14/16 0021 12/14/16 0022  CALCIUM 8.5* 7.9*  --  8.1*  MG  --  1.8 2.4  --   PHOS  --  2.9 2.4*  --     CBC  Recent Labs Lab 12/13/16 0613 12/13/16 1458 12/14/16 0021  WBC 20.0* 19.4* 17.8*  HGB 8.6* 8.8* 8.7*  HCT 26.6* 27.0* 26.7*  PLT 224 213 205    Coag's  Recent Labs Lab 12/13/16 0019 12/14/16 0021  INR 1.31 1.21    Sepsis Markers  Recent Labs Lab 12/12/16 2028 12/12/16 2043 12/13/16 0613 12/14/16 0021  LATICACIDVEN 1.30 1.1  --   --   PROCALCITON  --   --  1.85 1.05    ABG  Recent Labs Lab 12/13/16 0100  PHART 7.305*  PCO2ART 51.8*  PO2ART 110*    Liver Enzymes  Recent Labs Lab 12/12/16 2003 12/14/16 0021  AST 2,024* 314*  ALT 1,366* 704*  ALKPHOS 140* 112  BILITOT 1.2 0.5  ALBUMIN 3.3* 2.4*    Cardiac Enzymes  Recent Labs Lab 12/13/16 0019 12/13/16 [id number] 12/13/16 1023  TROPONINI 1.25* 0.97* 0.93*    Glucose  Recent Labs Lab 12/13/16 1127 12/13/16 1656 12/13/16 1957 12/13/16 2354 12/14/16 0346 12/14/16 0752  GLUCAP 162* 142* 128* 125* 141* 138*    Imaging Dg Chest Port 1 View  Result Date: 12/13/2016 CLINICAL DATA:  Evaluate for lung collapse. EXAM: PORTABLE CHEST 1 VIEW COMPARISON:  December 12, 2016 FINDINGS: There has been interval improvement on the left. While there is still opacity throughout the left mid and lower lung, the more focal infiltrate in the left base is much improved. Minimal opacity in the right base. No other interval change. ET and NG tubes are in good position. IMPRESSION: 1. Persistent bilateral pulmonary opacities, greater on the left  than the right. The more focal component of opacity in the left base is improved however. Recommend follow-up to complete resolution. Electronically Signed   By: Dorise Bullion III M.D   On: 12/13/2016 12:24   Dg Abd Portable 1v  Result Date: 12/13/2016 CLINICAL DATA:  Feeding tube placement EXAM: PORTABLE ABDOMEN - 1 VIEW COMPARISON:  12/12/2016 FINDINGS: Nasogastric tube tip is in the distal body of stomach. Side port is well below the GE junction. Cholecystectomy clips noted within the right upper quadrant of the abdomen. IMPRESSION: 1. Satisfactory position of nasogastric tube. Electronically Signed   By: Kerby Moors M.D.   On: 12/13/2016 11:05     STUDIES:  EKG 1/19>> Sinus tachycardia, T wave inversion in inferior leads which are new compared to previous EKG, QTc 620 CXR 1/19>> Consolidations bilaterally CT head 1/19>> Left occipital lobe probable acute or early subacute infarction. No acute intracranial hemorrhage. MRI brain >> pending   CULTURES: Viral resp panel>> neg Resp Cx>> Blood Cx>>  ANTIBIOTICS: Ceftriaxone 1/19>> Azithromycin 1/19>>  SIGNIFICANT EVENTS: 1/19>> Intubated, admitted  LINES/TUBES: ETT 1/19>> Foley 1/19>> OG 1/19>>  DISCUSSION: 51 year old woman with 45 pack year smoking history and asthma, allergic rhinitis, COPD, HTN, Bipolar d/o presenting with dyspnea found to have acute asthma exacerbation, acute hypoxic respiratory failure and multilobar pneumonia on CXR.  ASSESSMENT / PLAN:  PULMONARY A: Acute asthma exacerbation Acute hypoxic respiratory failure COPD Smoker Allergic rhinitis Multilobar pneumonia P:   Full vent support CXR in AM Cont albuterol neb Duonebs q6h Abx per below Solumedrol 60mg  daily  CARDIOVASCULAR A:  HTN Abnormal EKG Echo- Hyperdynamic EF (65-70%), grade 2 diastolic dysfunction P:  Hold off on statin for now Trend trops Cardiac monitoring Carotid u/s Heparin gtt  RENAL A:   Hyponatremia >> Na  [id number] Metabolic acidosis with increased anion gap>> Bicarb 21, gap 16.  AKI >> Cr 3.46 (baseline 0.7) P:   NS@100  bmet in AM  GASTROINTESTINAL A:   Transaminitis >> AST 2024, ALT 1366, trending down P:   NPO Pepcid for SUP Acute hepatitis panel Tylenol lvl RUQ U/S  HEMATOLOGIC A:   Leukocytosis >> 19.2 Acute Anemia >> Hgb 10.4 on admission. Baseline around 12.  P:  Hep gtt CBC in AM  INFECTIOUS A:   Multilobar PNA P:   F/u BCx F/u Resp Cx Follow PCT Urine strep pneumo Ceftriaxone 1/19>> Azithromycin 1/19>>  ENDOCRINE A:   Hyperglycemia P:   Check Hgb A1c SSI  NEUROLOGIC/PSYCH A:   Acute encephalopathy Acute CVA Bipolar d/o P:   RASS goal: 0 to -1 Propofol gtt, fent prn ASA daily F/u MRI brain Neuro following    FAMILY  - Updates: Daughter updated at bedside and via phone after CT head results returned 1/20.  - Inter-disciplinary family  meet or Palliative Care meeting due by:  1/27  Albin Felling, MD, MPH Internal Medicine Resident, PGY-III Pager: [phone]   Pulmonary and [place] Pager: [phone] 12/14/2016, 7:56 AM

## 2016-12-14 NOTE — Progress Notes (Signed)
Subjective: Interval History: Did not get MRI yet.  No new events.  Objective: Vital signs in last 24 hours: Temp:  [98.3 F (36.8 C)-99.8 F (37.7 C)] 99.8 F (37.7 C) (01/21 0754) Pulse Rate:  [78-107] 90 (01/21 0800) Resp:  [13-25] 17 (01/21 0800) BP: (81-167)/(60-127) 132/79 (01/21 0800) SpO2:  [94 %-100 %] 94 % (01/21 0800) FiO2 (%):  [40 %] 40 % (01/21 0400) Weight:  [76.9 kg (169 lb 8.5 oz)] 76.9 kg (169 lb 8.5 oz) (01/21 0400)  Intake/Output from previous day: 01/20 0701 - 01/21 0700 In: 4664.3 [I.V.:4089.3; NG/GT:175; IV Piggyback:400] Out: 2025 [Urine:1735; Emesis/NG output:290] Intake/Output this shift: No intake/output data recorded. Nutritional status: Diet NPO time specified  Neurologic Exam:  Intubated on Propofol.  Stopped gtt and examined.  Does not really open eyes.  Withdraws to pain in all 4 extremities and grimaces appropriately.    I cannot get a visual field exam.  Lab Results:  Recent Labs  12/13/16 0613 12/13/16 1458 12/14/16 0021 12/14/16 0022  WBC 20.0* 19.4* 17.8*  --   HGB 8.6* 8.8* 8.7*  --   HCT 26.6* 27.0* 26.7*  --   PLT 224 213 205  --   NA 135  --   --  137  K 4.1  --   --  4.6  CL 101  --   --  106  CO2 24  --   --  22  GLUCOSE 185*  --   --  118*  BUN 30*  --   --  18  CREATININE 1.54*  --   --  0.75  CALCIUM 7.9*  --   --  8.1*   Lipid Panel  Recent Labs  12/13/16 0613  CHOL 113  TRIG 127  HDL 36*  CHOLHDL 3.1  VLDL 25  LDLCALC 52    Studies/Results: Ct Head Wo Contrast  Result Date: 12/13/2016 CLINICAL DATA:  50 y/o  F; hypoxia and altered mental status. EXAM: CT HEAD WITHOUT CONTRAST TECHNIQUE: Contiguous axial images were obtained from the base of the skull through the vertex without intravenous contrast. COMPARISON:  11/12/2007 CT head. FINDINGS: Brain: Wedge-shaped focus of hypoattenuation with loss of gray-white differentiation involving the left occipital lobe may represent an acute or subacute infarction  (series 2, image 10 and series 4, image 57). Chronic left inferior frontal infarct. Mild chronic microvascular ischemic changes and parenchymal volume loss of the brain. No acute hemorrhage, hydrocephalus, or extra-axial collection is identified. Vascular: No hyperdense vessel. Skull: Normal. Negative for fracture or focal lesion. Sinuses/Orbits: No acute finding. Other: None. IMPRESSION: Left occipital lobe probable acute or early subacute infarction. No acute intracranial hemorrhage. Critical Value/emergent results were called by telephone at the time of interpretation on 12/13/2016 at 12:07 am to Elizabeth Deterding, who verbally acknowledged these results. Electronically Signed   By: Lance  Furusawa-Stratton M.D.   On: 12/13/2016 00:04   Dg Chest Port 1 View  Result Date: 12/14/2016 CLINICAL DATA:  Evaluate ET tube placement EXAM: PORTABLE CHEST 1 VIEW COMPARISON:  December 13, 2016 FINDINGS: The ETT is in good position. The NG tube remains. No pneumothorax. Persistent bilateral pulmonary opacities, left greater than right for slightly more prominent in the right base and mildly decreased on the left in the interval. Recommend follow-up to complete resolution. IMPRESSION: 1. Stable support apparatus. 2. Bilateral pulmonary opacities persist, mildly improved on the left and mildly worsened in the right base. Electronically Signed   By: David  Williams III M.D     On: 12/14/2016 08:01   Dg Chest Port 1 View  Result Date: 12/13/2016 CLINICAL DATA:  Evaluate for lung collapse. EXAM: PORTABLE CHEST 1 VIEW COMPARISON:  December 12, 2016 FINDINGS: There has been interval improvement on the left. While there is still opacity throughout the left mid and lower lung, the more focal infiltrate in the left base is much improved. Minimal opacity in the right base. No other interval change. ET and NG tubes are in good position. IMPRESSION: 1. Persistent bilateral pulmonary opacities, greater on the left than the right. The  more focal component of opacity in the left base is improved however. Recommend follow-up to complete resolution. Electronically Signed   By: Dorise Bullion III M.D   On: 12/13/2016 12:24   Dg Chest Portable 1 View  Result Date: 12/12/2016 CLINICAL DATA:  Post intubation EXAM: PORTABLE CHEST 1 VIEW COMPARISON:  Chest x-ray from earlier same day. FINDINGS: Endotracheal tube well positioned with tip approximately 2 cm above the carina. Nasogastric tube passes below the diaphragm. Dense opacity at the left lung base is unchanged in extent although denser on the present exam. The milder patchy airspace opacities at the right lung base are unchanged. Cardiomediastinal silhouette appears stable. IMPRESSION: 1. Endotracheal tube well positioned with tip just above the level of the carina. 2. Enteric tube passes below the diaphragm. 3. Large dense opacity at the left lung base, stable in extent but increased in density compared to the chest x-ray from earlier today suggesting airspace collapse, increasingly confluent edema and/or increasing pleural effusion. Suspect additional mild edema at the right lung base, stable. Electronically Signed   By: Franki Cabot M.D.   On: 12/12/2016 21:42   Dg Chest Portable 1 View  Result Date: 12/12/2016 CLINICAL DATA:  Dyspnea EXAM: PORTABLE CHEST 1 VIEW COMPARISON:  CXR 08/14/2016, chest CT 12/12/2015 FINDINGS: Patchy airspace opacities are seen along the periphery of the left lung and right lung base. Multilobar pneumonia might account for this appearance. Asymmetric pulmonary edema would be less common. Heart is borderline enlarged. Slightly more prominent aorta likely due to patient rotation. No acute osseous abnormality. Small left effusion is not entirely excluded. IMPRESSION: Pulmonary consolidations noted bilaterally which may reflect multilobar pneumonia involving both lung bases, left upper lobe and lingula. Cannot exclude a small left effusion given hazy opacity at the  left lung base. Electronically Signed   By: Ashley Royalty M.D.   On: 12/12/2016 20:05   Dg Abd Portable 1v  Result Date: 12/13/2016 CLINICAL DATA:  Feeding tube placement EXAM: PORTABLE ABDOMEN - 1 VIEW COMPARISON:  12/12/2016 FINDINGS: Nasogastric tube tip is in the distal body of stomach. Side port is well below the GE junction. Cholecystectomy clips noted within the right upper quadrant of the abdomen. IMPRESSION: 1. Satisfactory position of nasogastric tube. Electronically Signed   By: Kerby Moors M.D.   On: 12/13/2016 11:05   US Abdomen Limited Ruq  Result Date: 12/13/2016 CLINICAL DATA:  Transaminitis. EXAM: US ABDOMEN LIMITED - RIGHT UPPER QUADRANT COMPARISON:  09/18/2013 FINDINGS: Gallbladder: Surgically absent. Common bile duct: Diameter: 6.2 mm, normal. Liver: No focal lesion identified. Within normal limits in parenchymal echogenicity. Normal directional flow in the main portal vein. IMPRESSION: Postcholecystectomy without biliary dilatation. Unremarkable sonographic appearance of the liver. Electronically Signed   By: Jeb Levering M.D.   On: 12/13/2016 03:33    Medications:  Scheduled: . aspirin  300 mg Rectal Daily   Or  . aspirin  325 mg Per Tube Daily  .  azithromycin (ZITHROMAX) 500 MG IVPB  500 mg Intravenous Q24H  . cefTRIAXone (ROCEPHIN)  IV  1 g Intravenous Q24H  . chlorhexidine gluconate (MEDLINE KIT)  15 mL Mouth Rinse BID  . famotidine (PEPCID) IV  20 mg Intravenous Q24H  . insulin aspart  0-9 Units Subcutaneous Q4H  . ipratropium-albuterol  3 mL Nebulization Q6H  . mouth rinse  15 mL Mouth Rinse QID  . methylPREDNISolone (SOLU-MEDROL) injection  60 mg Intravenous Q24H    Assessment/Plan:  Possible left occipital infarct on CT vs artifact.  Awaiting MRI Brain.  Cont ASA PR.     LOS: 2 days   Rogue Jury, MD 12/14/2016  8:16 AM

## 2016-12-15 DIAGNOSIS — J181 Lobar pneumonia, unspecified organism: Secondary | ICD-10-CM

## 2016-12-15 LAB — CBC WITH DIFFERENTIAL/PLATELET
BASOS PCT: 1 %
Basophils Absolute: 0.2 10*3/uL — ABNORMAL HIGH (ref 0.0–0.1)
EOS ABS: 0 10*3/uL (ref 0.0–0.7)
Eosinophils Relative: 0 %
HCT: 31 % — ABNORMAL LOW (ref 36.0–46.0)
Hemoglobin: 9.6 g/dL — ABNORMAL LOW (ref 12.0–15.0)
LYMPHS ABS: 0.8 10*3/uL (ref 0.7–4.0)
Lymphocytes Relative: 5 %
MCH: 28.9 pg (ref 26.0–34.0)
MCHC: 31 g/dL (ref 30.0–36.0)
MCV: 93.4 fL (ref 78.0–100.0)
MONO ABS: 0.3 10*3/uL (ref 0.1–1.0)
Monocytes Relative: 2 %
NEUTROS ABS: 15.1 10*3/uL — AB (ref 1.7–7.7)
Neutrophils Relative %: 92 %
PLATELETS: 244 10*3/uL (ref 150–400)
RBC: 3.32 MIL/uL — ABNORMAL LOW (ref 3.87–5.11)
RDW: 16 % — AB (ref 11.5–15.5)
WBC: 16.4 10*3/uL — ABNORMAL HIGH (ref 4.0–10.5)

## 2016-12-15 LAB — CULTURE, RESPIRATORY W GRAM STAIN

## 2016-12-15 LAB — GLUCOSE, CAPILLARY
GLUCOSE-CAPILLARY: 123 mg/dL — AB (ref 65–99)
GLUCOSE-CAPILLARY: 139 mg/dL — AB (ref 65–99)
Glucose-Capillary: 105 mg/dL — ABNORMAL HIGH (ref 65–99)
Glucose-Capillary: 113 mg/dL — ABNORMAL HIGH (ref 65–99)
Glucose-Capillary: 133 mg/dL — ABNORMAL HIGH (ref 65–99)
Glucose-Capillary: 140 mg/dL — ABNORMAL HIGH (ref 65–99)
Glucose-Capillary: 149 mg/dL — ABNORMAL HIGH (ref 65–99)

## 2016-12-15 LAB — BASIC METABOLIC PANEL
Anion gap: 8 (ref 5–15)
BUN: 22 mg/dL — ABNORMAL HIGH (ref 6–20)
CHLORIDE: 107 mmol/L (ref 101–111)
CO2: 26 mmol/L (ref 22–32)
CREATININE: 0.86 mg/dL (ref 0.44–1.00)
Calcium: 8.7 mg/dL — ABNORMAL LOW (ref 8.9–10.3)
GFR calc non Af Amer: >60 mL/min (ref >60)
GLUCOSE: 119 mg/dL — AB (ref 65–99)
Potassium: 4.6 mmol/L (ref 3.5–5.1)
Sodium: 141 mmol/L (ref 135–145)

## 2016-12-15 LAB — HEPATIC FUNCTION PANEL
ALBUMIN: 2.6 g/dL — AB (ref 3.5–5.0)
ALT: 497 U/L — ABNORMAL HIGH (ref 14–54)
AST: 116 U/L — AB (ref 15–41)
Alkaline Phosphatase: 112 U/L (ref 38–126)
Bilirubin, Direct: 0.1 mg/dL (ref 0.1–0.5)
Indirect Bilirubin: 0.2 mg/dL — ABNORMAL LOW (ref 0.3–0.9)
Total Bilirubin: 0.3 mg/dL (ref 0.3–1.2)
Total Protein: 5.3 g/dL — ABNORMAL LOW (ref 6.5–8.1)

## 2016-12-15 LAB — CULTURE, RESPIRATORY

## 2016-12-15 LAB — TRIGLYCERIDES: Triglycerides: 143 mg/dL (ref <150)

## 2016-12-15 LAB — MAGNESIUM: Magnesium: 2 mg/dL (ref 1.7–2.4)

## 2016-12-15 LAB — PHOSPHORUS: Phosphorus: 2.4 mg/dL — ABNORMAL LOW (ref 2.5–4.6)

## 2016-12-15 LAB — PROCALCITONIN: Procalcitonin: 0.63 ng/mL

## 2016-12-15 MED ORDER — ACYCLOVIR 200 MG/5ML PO SUSP
400.0000 mg | Freq: Three times a day (TID) | ORAL | Status: AC
  Administered 2016-12-15 – 2016-12-22 (×20): 400 mg
  Filled 2016-12-15 (×23): qty 10

## 2016-12-15 MED ORDER — HEPARIN SODIUM (PORCINE) 5000 UNIT/ML IJ SOLN
5000.0000 [IU] | Freq: Three times a day (TID) | INTRAMUSCULAR | Status: DC
  Administered 2016-12-15 – 2016-12-26 (×31): 5000 [IU] via SUBCUTANEOUS
  Filled 2016-12-15 (×35): qty 1

## 2016-12-15 MED ORDER — FAMOTIDINE IN NACL 20-0.9 MG/50ML-% IV SOLN
20.0000 mg | Freq: Two times a day (BID) | INTRAVENOUS | Status: DC
  Administered 2016-12-15 – 2016-12-22 (×15): 20 mg via INTRAVENOUS
  Filled 2016-12-15 (×16): qty 50

## 2016-12-15 MED ORDER — VITAL HIGH PROTEIN PO LIQD
1000.0000 mL | ORAL | Status: DC
  Administered 2016-12-15: 1000 mL
  Filled 2016-12-15: qty 1000

## 2016-12-15 MED ORDER — SODIUM PHOSPHATES 45 MMOLE/15ML IV SOLN
10.0000 mmol | Freq: Once | INTRAVENOUS | Status: AC
  Administered 2016-12-15: 10 mmol via INTRAVENOUS
  Filled 2016-12-15: qty 3.33

## 2016-12-15 MED ORDER — VITAL AF 1.2 CAL PO LIQD
1000.0000 mL | ORAL | Status: DC
  Administered 2016-12-15 – 2016-12-17 (×3): 1000 mL

## 2016-12-15 MED ORDER — ESCITALOPRAM OXALATE 20 MG PO TABS
40.0000 mg | ORAL_TABLET | Freq: Every day | ORAL | Status: DC
  Administered 2016-12-15 – 2016-12-22 (×8): 40 mg via ORAL
  Filled 2016-12-15 (×8): qty 2

## 2016-12-15 MED ORDER — QUETIAPINE FUMARATE 300 MG PO TABS
300.0000 mg | ORAL_TABLET | Freq: Every day | ORAL | Status: DC
Start: 2016-12-15 — End: 2016-12-18
  Administered 2016-12-15 – 2016-12-17 (×3): 300 mg
  Filled 2016-12-15 (×3): qty 1

## 2016-12-15 NOTE — Plan of Care (Signed)
Problem: Coping: Goal: Ability to identify strategies to decrease anxiety will improve Outcome: Progressing Family (daughter) at bedside, very supportive. Informed on current patient condition and asking questions appropriately. Calm and interactive.

## 2016-12-15 NOTE — Progress Notes (Signed)
PULMONARY / CRITICAL CARE MEDICINE   Name: [name] MRN: [id number] DOB: Jun 06, 1966    ADMISSION DATE:  12/12/2016 CONSULTATION DATE:  12/12/2016  REFERRING MD:  Alvino Chapel, MD (EDP)  CHIEF COMPLAINT:  Asthma exacerbation  HISTORY OF PRESENT ILLNESS:   51 year old woman with 45 pack year smoking history and asthma, allergic rhinitis, COPD, HTN, Bipolar d/o presenting with dyspnea found to have acute hypoxic respiratory failure and multilobar pneumonia on CXR. History obtained from daughter and EMR due to acute encephalopathy. She has been feeling poorly for the past 2-3 days with fatigue, malaise, lethargy, subjective fevers, chills, myalgias, confusion. She has a cough productive of yellow green sputum. She has dyspnea. She has lower midsternal chest pain/epigastric pain. She has nausea but no vomiting. No diarrhea, dysuria or hematuria. Possible focal weakness with not lifting her L arm.  O2 sat was 60s upon EMS arrival. She was given albuterol and Duoneb by EMS. She was placed on BiPAP. Was noted to be alert but very lethargic. She was intubated in the ED. She was started on ceftriaxone and azithromycin.   SUBJECTIVE:  Sedated and intubated. New cold sores on lip noted last night.  VITAL SIGNS: BP 135/74   Pulse 89   Temp (!) 100.4 F (38 C) (Oral)   Resp (!) 24   Ht 5\' 4"  (1.626 m)   Wt 173 lb 8 oz (78.7 kg)   SpO2 97%   BMI 29.78 kg/m   HEMODYNAMICS:    VENTILATOR SETTINGS: Vent Mode: PRVC FiO2 (%):  [40 %] 40 % Set Rate:  [16 bmp] 16 bmp Vt Set:  [420 mL] 420 mL PEEP:  [5 cmH20] 5 cmH20 Plateau Pressure:  [12 cmH20-18 cmH20] 17 cmH20  INTAKE / OUTPUT: I/O last 3 completed shifts: In: 3804.5 [I.V.:3094.5; Other:130; NG/GT:180; IV Piggyback:400] Out: 2825 [Urine:2505; Emesis/NG output:320]  PHYSICAL EXAMINATION: General:  Middle aged woman sedated on vent, NAD Neuro:  Sedated on vent HEENT:  PERRL bilaterally, dry mucous membranes Cardiovascular:  RRR, no  m/g/r Lungs:  Wheezes through bilateral lung fields Abdomen:  Soft, nontender, nondistended Musculoskeletal:  No LE edema Skin:  Warm and well perfused  LABS:  BMET  Recent Labs Lab 12/13/16 0613 12/14/16 0022 12/15/16 0506  NA 135 137 141  K 4.1 4.6 4.6  CL 101 106 107  CO2 24 22 26   BUN 30* 18 22*  CREATININE 1.54* 0.75 0.86  GLUCOSE 185* 118* 119*    Electrolytes  Recent Labs Lab 12/13/16 0613 12/14/16 0021 12/14/16 0022 12/15/16 0506  CALCIUM 7.9*  --  8.1* 8.7*  MG 1.8 2.4  --  2.0  PHOS 2.9 2.4*  --  2.4*    CBC  Recent Labs Lab 12/13/16 1458 12/14/16 0021 12/15/16 0506  WBC 19.4* 17.8* 16.4*  HGB 8.8* 8.7* 9.6*  HCT 27.0* 26.7* 31.0*  PLT 213 205 244    Coag's  Recent Labs Lab 12/13/16 0019 12/14/16 0021  INR 1.31 1.21    Sepsis Markers  Recent Labs Lab 12/12/16 2028 12/12/16 2043 12/13/16 0613 12/14/16 0021 12/15/16 0506  LATICACIDVEN 1.30 1.1  --   --   --   PROCALCITON  --   --  1.85 1.05 0.63    ABG  Recent Labs Lab 12/13/16 0100  PHART 7.305*  PCO2ART 51.8*  PO2ART 110*    Liver Enzymes  Recent Labs Lab 12/12/16 2003 12/14/16 0021  AST 2,024* 314*  ALT 1,366* 704*  ALKPHOS 140* 112  BILITOT 1.2 0.5  ALBUMIN 3.3* 2.4*    Cardiac Enzymes  Recent Labs Lab 12/13/16 0019 12/13/16 0613 12/13/16 1023  TROPONINI 1.25* 0.97* 0.93*    Glucose  Recent Labs Lab 12/14/16 1230 12/14/16 1655 12/14/16 1954 12/15/16 0036 12/15/16 0337 12/15/16 0737  GLUCAP 137* 137* 130* 105* 113* 10*    Imaging Mr Brain Wo Contrast  Result Date: 12/14/2016 CLINICAL DATA:  Acute hypoxic respiratory failure. Acute encephalopathy. EXAM: MRI HEAD WITHOUT CONTRAST TECHNIQUE: Multiplanar, multiecho pulse sequences of the brain and surrounding structures were obtained without intravenous contrast. COMPARISON:  Head CT 12/12/2016. FINDINGS: Brain: Diffusion imaging shows a cluster of small acute infarctions in the left  cerebellum. No brainstem abnormality. Cerebral hemispheres show mild chronic small-vessel ischemic change of the deep white matter. There is an old left inferior frontal infarction which has progressed to encephalomalacia. This could be ischemic or post traumatic. No mass lesion, hemorrhage, hydrocephalus or extra-axial collection. Vascular: Major vessels at the base of the brain show flow. 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 No mass effect or hemorrhage. 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 Mild chronic small-vessel ischemic change of the hemispheric white matter. Left occipital lobe questioned by CT is negative by MRI. Electronically Signed   By: Nelson Chimes M.D.   On: 12/14/2016 12:18     STUDIES:  EKG 1/19>> Sinus tachycardia, T wave inversion in inferior leads which are new compared to previous EKG, QTc 620 CXR 1/19>> Consolidations bilaterally CT head 1/19>> Left occipital lobe probable acute or early subacute infarction. No acute intracranial hemorrhage. MRI brain >> sm cluster acute infarcts in L cerebellum, old left frontal infarct  CULTURES: Viral resp panel>> neg Resp Cx>> Abundant strep pneumo Blood Cx>>  ANTIBIOTICS: Ceftriaxone 1/19>> Azithromycin 1/19>>  SIGNIFICANT EVENTS: 1/19>> Intubated, admitted  LINES/TUBES: ETT 1/19>> Foley 1/19>> OG 1/19>>  DISCUSSION: 51 year old woman with 45 pack year smoking history and asthma, allergic rhinitis, COPD, HTN, Bipolar d/o presenting with dyspnea found to have acute asthma exacerbation, acute hypoxic respiratory failure and multilobar pneumonia on CXR.  ASSESSMENT / PLAN:  PULMONARY A: Acute asthma exacerbation Acute hypoxic respiratory failure COPD Smoker Allergic rhinitis Multilobar pneumonia P:   Full vent support Cont albuterol neb Duonebs q6h Abx per  below Solumedrol 60mg  daily  CARDIOVASCULAR A:  HTN Abnormal EKG Elevated trop Echo- Hyperdynamic EF (65-70%), grade 2 diastolic dysfunction P:  Hold off on statin for now Cardiac monitoring Carotid u/s- no stenosis Heparin gtt  RENAL A:   Hyponatremia >> resolved Metabolic acidosis with increased anion gap>>  Bicarb 21, gap 16. - resolved AKI >> Cr 3.46 (baseline 0.7)>> resolved P:   bmet in AM Correct electrolytes as needed  GASTROINTESTINAL A:   Transaminitis >> AST 2024, ALT 1366, trending down P:   NPO Pepcid for SUP Acute hepatitis panel neg Tylenol lvl- normal RUQ U/S- neg Need to feed F/u AM LFTs  HEMATOLOGIC A:   Leukocytosis >> 19.2>16 Acute Anemia >> Hgb 10.4 on admission. Baseline around 12.  P:  Hep gtt CBC in AM  INFECTIOUS A:   Multilobar PNA P:   F/u BCx F/u Resp Cx- strep pneumo Follow PCT Urine strep pneumo Ceftriaxone 1/19>> Azithromycin 1/19>>  ENDOCRINE A:   Hyperglycemia P:   Check Hgb A1c SSI  NEUROLOGIC/PSYCH A:   Acute encephalopathy Acute CVA Bipolar d/o P:   RASS goal: 0 to -1 Propofol gtt, fent prn ASA  daily F/u MRI brain Neuro following  Restart some home meds   FAMILY  - Updates: Daughter updated at bedside and via phone after CT head results returned 1/20.  - Inter-disciplinary family meet or Palliative Care meeting due by:  1/27  Albin Felling, MD, MPH Internal Medicine Resident, PGY-III Pager: [phone]   12/15/2016, 7:56 AM

## 2016-12-15 NOTE — Progress Notes (Signed)
STROKE TEAM PROGRESS NOTE   HISTORY OF PRESENT ILLNESS (per record) [name] is an 51 y.o. female admitted with dyspnea, pneumonia, respiratory distress, and encephalopathy.  Due to the latter, a CT Brain was obtained, which I reviewed personally.  There is a hypodensity in the left occipital area, which may represent stroke, but can also be artifact.  MRI Brain is pending.   SUBJECTIVE (INTERVAL HISTORY) No family members present. The patient is intubated and sedated. She is minimally responsive.   OBJECTIVE Temp:  [98.6 F (37 C)-100.4 F (38 C)] 100.4 F (38 C) (01/22 0739) Pulse Rate:  [81-106] 89 (01/22 0700) Cardiac Rhythm: Normal sinus rhythm (01/22 0400) Resp:  [13-25] 24 (01/22 0700) BP: (122-163)/(65-91) 135/74 (01/22 0700) SpO2:  [96 %-100 %] 97 % (01/22 0700) FiO2 (%):  [40 %] 40 % (01/22 0400) Weight:  [78.7 kg (173 lb 8 oz)] 78.7 kg (173 lb 8 oz) (01/22 0500)  CBC:   Recent Labs Lab 12/14/16 0021 12/15/16 0506  WBC 17.8* 16.4*  NEUTROABS 16.9* 15.1*  HGB 8.7* 9.6*  HCT 26.7* 31.0*  MCV 91.1 93.4  PLT 205 [id number]    Basic Metabolic Panel:   Recent Labs Lab 12/14/16 0021 12/14/16 0022 12/15/16 0506  NA  --  137 141  K  --  4.6 4.6  CL  --  106 107  CO2  --  22 26  GLUCOSE  --  118* 119*  BUN  --  18 22*  CREATININE  --  0.75 0.86  CALCIUM  --  8.1* 8.7*  MG 2.4  --  2.0  PHOS 2.4*  --  2.4*    Lipid Panel:     Component Value Date/Time   CHOL 113 12/13/2016 0613   TRIG 143 12/15/2016 0506   HDL 36 (L) 12/13/2016 0613   CHOLHDL 3.1 12/13/2016 0613   VLDL 25 12/13/2016 0613   LDLCALC 52 12/13/2016 0613   HgbA1c:  Lab Results  Component Value Date   HGBA1C 5.4 12/13/2016   Urine Drug Screen:     Component Value Date/Time   LABOPIA POSITIVE (A) 12/12/2016 0032   COCAINSCRNUR NONE DETECTED 12/12/2016 0032   LABBENZ POSITIVE (A) 12/12/2016 0032   AMPHETMU NONE DETECTED 12/12/2016 0032   THCU NONE DETECTED 12/12/2016 0032   LABBARB  NONE DETECTED 12/12/2016 0032      IMAGING  Mr Brain Wo Contrast 12/14/2016 Small cluster of acute infarctions in the left cerebellum. No mass effect or hemorrhage.  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  Mild chronic small-vessel ischemic change of the hemispheric white matter.  Left occipital lobe questioned by CT is negative by MRI.     Dg Chest Port 1 View 12/14/2016 1. Stable support apparatus.  2. Bilateral pulmonary opacities persist, mildly improved on the left and mildly worsened in the right base.     Dg Chest Port 1 View 12/13/2016 Persistent bilateral pulmonary opacities, greater on the left than the right.  The more focal component of opacity in the left base is improved however.    Dg Abd Portable 1v 12/13/2016 1. Satisfactory position of nasogastric tube.     PHYSICAL EXAM Middle aged lady intubated and sedated. . Afebrile. Head is nontraumatic. Neck is supple without bruit.    Cardiac exam no murmur or gallop. Lungs are clear to auscultation. Distal pulses are well felt.  Neurological Exam ;  Unresponsive. Eyes are closed and opens partially to noxious stimuli.does not follow  any commands. Eyes are in midline. Doll`s eye movements are sluggish. Fundi not visualized. Tongue midline. Withdraws all 4 extremities equally to painful stimuli. Deep tendon reflexes are depressed. Plantars are downgoing.  ASSESSMENT/PLAN [name] is a 52 y.o. female with history of migraine headaches, tobacco history, hypertension, COPD, bipolar disorder, and recent pneumonia presenting with encephalopathy.  She did not receive IV t-PA due to unknown time of onset.  Strokes:  Non-dominant infarcts likely due to small vessel disease.  Resultant  suspect no focal deficits but currently exam limited by sedation  MRI - Small cluster of acute infarctions in the left cerebellum.  MRA - not performed.  Carotid Doppler - bilateral ICAs 1-39%.  Vertebrals are patent with antegrade flow.  2D Echo - EF 65-70%. No cardiac source of emboli identified.  LDL - 52  HgbA1c - 5.4  VTE prophylaxis - subcutaneous heparin Diet NPO time specified  No antithrombotic prior to admission, now on aspirin 300 mg suppository daily  Patient will be counseled to be compliant with her antithrombotic medications  Ongoing aggressive stroke risk factor management  Therapy recommendations:  Pending  Disposition: Pending  Hypertension  Stable  Permissive hypertension (OK if < 220/120) but gradually normalize in 5-7 days  Long-term BP goal normotensive  Hyperlipidemia  Home meds:  Crestor 10 mg daily prior to admission.  LDL 52, goal < 70  Resume Crestor when PO access is available.  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   Family hx stroke (mother)  Migraines  Other Active Problems  Intubated  NPO  UDS - positive for opiates and benzodiazepines ( the patient has prescriptions for both medications )  Fever / leukocytosis - 16.4 / pneumonia - IV Rocephin started Saturday, 12/13/2016. Zovirax 12/15/2016.  Sputum and Urine  - STREPTOCOCCUS PNEUMONIAE.   Hospital day # [place] PA-C [facility] Pager [phone] 12/15/2016, 2:30 PM  I have personally examined this patient, reviewed notes, independently viewed imaging studies, participated in medical decision making and plan of care.ROS completed by me personally and pertinent positives fully documented  I have made any additions or clarifications directly to the above note. Agree with note above. Patient has presented predominantly with respiratory difficulties from pneumonia and MRI shows a small  left cerebellar white matter infarct likely due to small vessel disease related to sepsis and relative hypotension. Recommend continue ongoing stroke evaluation. Aspirin for stroke prevention. Aggressive risk factor modification. Discussed with Dr. Vaughan Browner from pulmonary critical care medicine This patient is critically ill and at significant risk of neurological worsening, death and care requires constant monitoring of vital signs, hemodynamics,respiratory and cardiac monitoring, extensive review of multiple databases, frequent neurological assessment, discussion with family, other specialists and medical decision making of high complexity.I have made any additions or clarifications directly to the above note.This critical care time does not reflect procedure time, or teaching time or supervisory time of PA/NP/Med Resident etc but could involve care discussion time.  I spent 30 minutes of neurocritical care time  in the care of  this patient.     Antony Contras, MD Medical Director [place] Pager: [phone] 12/15/2016 3:44 PM  To contact Stroke Continuity provider, please refer  to [email or web address] After hours, contact General Neurology

## 2016-12-15 NOTE — Progress Notes (Signed)
Nutrition Follow-up / Consult  DOCUMENTATION CODES:   Not applicable  INTERVENTION:    Initiate TF via OGT with Vital AF 1.2 at goal rate of 60 ml/h (1440 ml per day) to provide 1728 kcals, 108 gm protein, 1168 ml free water daily.  NUTRITION DIAGNOSIS:   Inadequate oral intake related to inability to eat as evidenced by NPO status.  Ongoing  GOAL:   Patient will meet greater than or equal to 90% of their needs  Progressing  MONITOR:   Vent status, Weight trends, Labs, I & O's  REASON FOR ASSESSMENT:   Consult Enteral/tube feeding initiation and management  ASSESSMENT:   51 year old with severe persistent asthma, COPD, hypertension, bipolar. Admitted with acute hypoxic respiratory failure, multilobar Streptococcus pneumonia.  Discussed patient in ICU rounds and with RN today. Received MD Consult for TF initiation and management. Adult TF Protocol has been initiated.  Patient is currently intubated on ventilator support MV: 7.6 L/min Temp (24hrs), Avg:99.1 F (37.3 C), Min:98.6 F (37 C), Max:100.4 F (38 C)  Propofol: 31.4 ml/hr providing 829 kcal per day.  Labs reviewed: phosphorus low at 2.4 CBG's: [phone] Medications reviewed and include lasix, sodium phosphate.  Diet Order:  Diet NPO time specified  Skin:  Reviewed, no issues  Last BM:  PTA/unknown  Height:   Ht Readings from Last 1 Encounters:  12/13/16 5\' 4"  (1.626 m)    Weight:   Wt Readings from Last 1 Encounters:  12/15/16 173 lb 8 oz (78.7 kg)   12/13/16 167 lb 15.9 oz (76.2 kg)    Ideal Body Weight:  54.54 kg  BMI:  Body mass index is 29.78 kg/m.  Estimated Nutritional Needs:   Kcal:  1680  Protein:  100-115 gm  Fluid:  >/= 1.7 L  EDUCATION NEEDS:   No education needs identified at this time  Molli Barrows, [place], [place], [place] Pager [phone] After Hours Pager [phone]

## 2016-12-16 ENCOUNTER — Inpatient Hospital Stay (HOSPITAL_COMMUNITY): Payer: Medicaid Other

## 2016-12-16 LAB — COMPREHENSIVE METABOLIC PANEL
ALT: 472 U/L — ABNORMAL HIGH (ref 14–54)
ANION GAP: 9 (ref 5–15)
AST: 111 U/L — ABNORMAL HIGH (ref 15–41)
Albumin: 2.8 g/dL — ABNORMAL LOW (ref 3.5–5.0)
Alkaline Phosphatase: 112 U/L (ref 38–126)
BUN: 28 mg/dL — ABNORMAL HIGH (ref 6–20)
CHLORIDE: 102 mmol/L (ref 101–111)
CO2: 28 mmol/L (ref 22–32)
Calcium: 8.9 mg/dL (ref 8.9–10.3)
Creatinine, Ser: 0.8 mg/dL (ref 0.44–1.00)
GFR calc non Af Amer: >60 mL/min (ref >60)
Glucose, Bld: 175 mg/dL — ABNORMAL HIGH (ref 65–99)
POTASSIUM: 4.1 mmol/L (ref 3.5–5.1)
SODIUM: 139 mmol/L (ref 135–145)
Total Bilirubin: 0.5 mg/dL (ref 0.3–1.2)
Total Protein: 5.8 g/dL — ABNORMAL LOW (ref 6.5–8.1)

## 2016-12-16 LAB — CBC WITH DIFFERENTIAL/PLATELET
BASOS ABS: 0.1 10*3/uL (ref 0.0–0.1)
Basophils Relative: 1 %
EOS ABS: 0 10*3/uL (ref 0.0–0.7)
EOS PCT: 0 %
HEMATOCRIT: 31.9 % — AB (ref 36.0–46.0)
Hemoglobin: 10.3 g/dL — ABNORMAL LOW (ref 12.0–15.0)
Lymphocytes Relative: 5 %
Lymphs Abs: 0.7 10*3/uL (ref 0.7–4.0)
MCH: 29.7 pg (ref 26.0–34.0)
MCHC: 32.3 g/dL (ref 30.0–36.0)
MCV: 91.9 fL (ref 78.0–100.0)
MONO ABS: 0.7 10*3/uL (ref 0.1–1.0)
Monocytes Relative: 5 %
NEUTROS PCT: 89 %
Neutro Abs: 12.7 10*3/uL — ABNORMAL HIGH (ref 1.7–7.7)
PLATELETS: 278 10*3/uL (ref 150–400)
RBC: 3.47 MIL/uL — AB (ref 3.87–5.11)
RDW: 16 % — AB (ref 11.5–15.5)
WBC: 14.2 10*3/uL — AB (ref 4.0–10.5)

## 2016-12-16 LAB — GLUCOSE, CAPILLARY
GLUCOSE-CAPILLARY: 180 mg/dL — AB (ref 65–99)
GLUCOSE-CAPILLARY: 155 mg/dL — AB (ref 65–99)
GLUCOSE-CAPILLARY: 121 mg/dL — AB (ref 65–99)
Glucose-Capillary: 136 mg/dL — ABNORMAL HIGH (ref 65–99)
Glucose-Capillary: 112 mg/dL — ABNORMAL HIGH (ref 65–99)

## 2016-12-16 LAB — MAGNESIUM: MAGNESIUM: 1.8 mg/dL (ref 1.7–2.4)

## 2016-12-16 LAB — PHOSPHORUS: PHOSPHORUS: 4.6 mg/dL (ref 2.5–4.6)

## 2016-12-16 LAB — LEGIONELLA PNEUMOPHILA SEROGP 1 UR AG: L. PNEUMOPHILA SEROGP 1 UR AG: NEGATIVE

## 2016-12-16 MED ORDER — DEXMEDETOMIDINE HCL IN NACL 400 MCG/100ML IV SOLN
0.0000 ug/kg/h | INTRAVENOUS | Status: AC
  Administered 2016-12-16 (×3): 1.2 ug/kg/h via INTRAVENOUS
  Administered 2016-12-16: 0.8 ug/kg/h via INTRAVENOUS
  Administered 2016-12-17 (×2): 1.2 ug/kg/h via INTRAVENOUS
  Administered 2016-12-17: 0.8 ug/kg/h via INTRAVENOUS
  Administered 2016-12-17 – 2016-12-18 (×8): 1.2 ug/kg/h via INTRAVENOUS
  Administered 2016-12-19: 1 ug/kg/h via INTRAVENOUS
  Filled 2016-12-16 (×3): qty 100
  Filled 2016-12-16: qty 50
  Filled 2016-12-16 (×2): qty 100
  Filled 2016-12-16: qty 50
  Filled 2016-12-16 (×2): qty 100
  Filled 2016-12-16: qty 50
  Filled 2016-12-16 (×2): qty 100
  Filled 2016-12-16: qty 50
  Filled 2016-12-16 (×3): qty 100
  Filled 2016-12-16: qty 50
  Filled 2016-12-16: qty 100

## 2016-12-16 MED ORDER — HYDRALAZINE HCL 20 MG/ML IJ SOLN
10.0000 mg | INTRAMUSCULAR | Status: DC | PRN
  Administered 2016-12-16 – 2016-12-26 (×15): 10 mg via INTRAVENOUS
  Filled 2016-12-16 (×15): qty 1

## 2016-12-16 MED ORDER — METOPROLOL TARTRATE 25 MG PO TABS
25.0000 mg | ORAL_TABLET | Freq: Two times a day (BID) | ORAL | Status: DC
  Administered 2016-12-16 – 2016-12-22 (×12): 25 mg
  Filled 2016-12-16 (×13): qty 1

## 2016-12-16 MED ORDER — CLONAZEPAM 0.5 MG PO TBDP
1.0000 mg | ORAL_TABLET | Freq: Two times a day (BID) | ORAL | Status: DC | PRN
  Administered 2016-12-17 – 2016-12-18 (×2): 1 mg via ORAL
  Filled 2016-12-16 (×3): qty 2

## 2016-12-16 MED ORDER — LAMOTRIGINE 100 MG PO TABS
100.0000 mg | ORAL_TABLET | Freq: Two times a day (BID) | ORAL | Status: DC
  Administered 2016-12-16 – 2016-12-22 (×13): 100 mg
  Filled 2016-12-16 (×15): qty 1

## 2016-12-16 MED ORDER — BUSPIRONE HCL 15 MG PO TABS
7.5000 mg | ORAL_TABLET | Freq: Three times a day (TID) | ORAL | Status: DC
  Filled 2016-12-16 (×3): qty 1

## 2016-12-16 MED ORDER — CLONAZEPAM 0.1 MG/ML ORAL SUSPENSION: 1.0000 mg | Freq: Two times a day (BID) | ORAL | Status: DC | PRN

## 2016-12-16 NOTE — Progress Notes (Signed)
STROKE TEAM PROGRESS NOTE   HISTORY OF PRESENT ILLNESS (per record) [name] is an 51 y.o. female admitted with dyspnea, pneumonia, respiratory distress, and encephalopathy.  Due to the latter, a CT Brain was obtained, which I reviewed personally.  There is a hypodensity in the left occipital area, which may represent stroke, but can also be artifact.  MRI Brain is pending.   SUBJECTIVE (INTERVAL HISTORY) No family members present. The patient is intubated and sedated. She remains minimally responsive.   OBJECTIVE Temp:  [97.3 F (36.3 C)-98.7 F (37.1 C)] 98.6 F (37 C) (01/23 1148) Pulse Rate:  [73-100] 76 (01/23 1148) Cardiac Rhythm: Normal sinus rhythm (01/23 0800) Resp:  [13-21] 15 (01/23 1148) BP: (111-159)/(62-85) 146/81 (01/23 1148) SpO2:  [93 %-100 %] 98 % (01/23 1148) FiO2 (%):  [40 %] 40 % (01/23 1148) Weight:  [164 lb 10.9 oz (74.7 kg)] 164 lb 10.9 oz (74.7 kg) (01/23 0430)  CBC:   Recent Labs Lab 12/15/16 0506 12/16/16 0210  WBC 16.4* 14.2*  NEUTROABS 15.1* 12.7*  HGB 9.6* 10.3*  HCT 31.0* 31.9*  MCV 93.4 91.9  PLT 244 0000000    Basic Metabolic Panel:   Recent Labs Lab 12/15/16 0506 12/16/16 0210  NA 141 139  K 4.6 4.1  CL 107 102  CO2 26 28  GLUCOSE 119* 175*  BUN 22* 28*  CREATININE 0.86 0.80  CALCIUM 8.7* 8.9  MG 2.0 1.8  PHOS 2.4* 4.6    Lipid Panel:     Component Value Date/Time   CHOL 113 12/13/2016 0613   TRIG 143 12/15/2016 0506   HDL 36 (L) 12/13/2016 0613   CHOLHDL 3.1 12/13/2016 0613   VLDL 25 12/13/2016 0613   LDLCALC 52 12/13/2016 0613   HgbA1c:  Lab Results  Component Value Date   HGBA1C 5.4 12/13/2016   Urine Drug Screen:     Component Value Date/Time   LABOPIA POSITIVE (A) 12/12/2016 0032   COCAINSCRNUR NONE DETECTED 12/12/2016 0032   LABBENZ POSITIVE (A) 12/12/2016 0032   AMPHETMU NONE DETECTED 12/12/2016 0032   THCU NONE DETECTED 12/12/2016 0032   LABBARB NONE DETECTED 12/12/2016 0032       IMAGING  Mr Brain Wo Contrast 12/14/2016 Small cluster of acute infarctions in the left cerebellum. No mass effect or hemorrhage.  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  Mild chronic small-vessel ischemic change of the hemispheric white matter.  Left occipital lobe questioned by CT is negative by MRI.     Dg Chest Port 1 View 12/14/2016 1. Stable support apparatus.  2. Bilateral pulmonary opacities persist, mildly improved on the left and mildly worsened in the right base.     Dg Chest Port 1 View 12/13/2016 Persistent bilateral pulmonary opacities, greater on the left than the right.  The more focal component of opacity in the left base is improved however.    Dg Abd Portable 1v 12/13/2016 1. Satisfactory position of nasogastric tube.     PHYSICAL EXAM Middle aged lady intubated and sedated. . Afebrile. Head is nontraumatic. Neck is supple without bruit.    Cardiac exam no murmur or gallop. Lungs are clear to auscultation. Distal pulses are well felt.  Neurological Exam ;  Unresponsive. Eyes are closed and opens partially to noxious stimuli.does not follow any commands. Eyes are in midline. Doll`s eye movements are sluggish. Fundi not visualized. Tongue midline. Withdraws all 4 extremities equally to painful stimuli. Deep tendon reflexes are depressed. Plantars are downgoing.  ASSESSMENT/PLAN [name] is a 51 y.o. female with history of migraine headaches, tobacco history, hypertension, COPD, bipolar disorder, and recent pneumonia presenting with encephalopathy.  She did not receive IV t-PA due to unknown time of onset.  Strokes:  Non-dominant infarcts likely due to small vessel disease.  Resultant  suspect no focal deficits but currently exam limited by sedation  MRI - Small cluster of acute infarctions in the left cerebellum.  MRA - not performed.  Carotid Doppler - bilateral ICAs 1-39%. Vertebrals are patent with antegrade  flow.  2D Echo - EF 65-70%. No cardiac source of emboli identified.  LDL - 52  HgbA1c - 5.4  VTE prophylaxis - subcutaneous heparin Diet NPO time specified  No antithrombotic prior to admission, now on aspirin 300 mg suppository daily  Patient will be counseled to be compliant with her antithrombotic medications  Ongoing aggressive stroke risk factor management  Therapy recommendations:  Pending  Disposition: Pending  Hypertension  Stable  Permissive hypertension (OK if < 220/120) but gradually normalize in 5-7 days  Long-term BP goal normotensive  Hyperlipidemia  Home meds:  Crestor 10 mg daily prior to admission.  LDL 52, goal < 70  Resume Crestor when PO access is available.  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   Family hx stroke (mother)  Migraines  Other Active Problems  Intubated  NPO  UDS - positive for opiates and benzodiazepines ( the patient has prescriptions for both medications )  Fever / leukocytosis - 16.4 / pneumonia - IV Rocephin started Saturday, 12/13/2016. Zovirax 12/15/2016.  Sputum and Urine  - STREPTOCOCCUS PNEUMONIAE.   Hospital day # 4   I have personally examined this patient, reviewed notes, independently viewed imaging studies, participated in medical decision making and plan of care.ROS completed by me personally and pertinent positives fully documented  I have made any additions or clarifications directly to the above note.  . Patient has presented predominantly with respiratory difficulties from pneumonia and MRI shows a small left cerebellar white matter infarct likely due to small vessel disease related to sepsis and relative hypotension. Recommend continue   Aspirin  for stroke prevention. Aggressive risk factor modification. Discussed with Dr. Vaughan Browner from pulmonary critical care medicine.Stroke team will sign off. Call for questions. This patient is critically ill and at significant risk of neurological worsening, death and care requires constant monitoring of vital signs, hemodynamics,respiratory and cardiac monitoring, extensive review of multiple databases, frequent neurological assessment, discussion with family, other specialists and medical decision making of high complexity.I have made any additions or clarifications directly to the above note.This critical care time does not reflect procedure time, or teaching time or supervisory time of PA/NP/Med Resident etc but could involve care discussion time.  I spent 30 minutes of neurocritical care time  in the care of  this patient.     Antony Contras, MD Medical Director [facility] Pager: [phone] 12/16/2016 1:06 PM  To contact Stroke Continuity provider, please refer to [email or web address] After hours, contact General Neurology

## 2016-12-16 NOTE — Progress Notes (Signed)
PULMONARY / CRITICAL CARE MEDICINE   Name: [name] MRN: [id number] DOB: Mar 16, 1966    ADMISSION DATE:  12/12/2016 CONSULTATION DATE:  12/12/2016  REFERRING MD:  Alvino Chapel, MD (EDP)   SUBJECTIVE:  Sedated and intubated. Still having problems with agitation  VITAL SIGNS: BP 126/67   Pulse 90   Temp 98.3 F (36.8 C) (Oral)   Resp 20   Ht 5\' 4"  (1.626 m)   Wt 164 lb 10.9 oz (74.7 kg)   SpO2 95%   BMI 28.27 kg/m   VENTILATOR SETTINGS: Vent Mode: PRVC FiO2 (%):  [40 %] 40 % Set Rate:  [16 bmp] 16 bmp Vt Set:  [420 mL] 420 mL PEEP:  [5 cmH20] 5 cmH20 Pressure Support:  [8 cmH20] 8 cmH20 Plateau Pressure:  [12 cmH20-14 cmH20] 12 cmH20  INTAKE / OUTPUT: I/O last 3 completed shifts: In: 3027.6 [I.V.:1256; Other:270; NG/GT:1351.7; IV Piggyback:150] Out: [id number]; Emesis/NG output:650]  PHYSICAL EXAMINATION: General:  Middle aged woman sedated on vent, NAD Neuro:  Sedated on vent HEENT:  PERRL bilaterally, dry mucous membranes Cardiovascular:  RRR, no m/g/r Lungs:  Wheezes through bilateral lung fields Abdomen:  Soft, nontender, nondistended Musculoskeletal:  No LE edema Skin:  Warm and well perfused  LABS:  BMET  Recent Labs Lab 12/14/16 0022 12/15/16 0506 12/16/16 0210  NA 137 141 139  K 4.6 4.6 4.1  CL 106 107 102  CO2 22 26 28   BUN 18 22* 28*  CREATININE 0.75 0.86 0.80  GLUCOSE 118* 119* 175*    Electrolytes  Recent Labs Lab 12/14/16 0021 12/14/16 0022 12/15/16 0506 12/16/16 0210  CALCIUM  --  8.1* 8.7* 8.9  MG 2.4  --  2.0 1.8  PHOS 2.4*  --  2.4* 4.6    CBC  Recent Labs Lab 12/14/16 0021 12/15/16 0506 12/16/16 0210  WBC 17.8* 16.4* 14.2*  HGB 8.7* 9.6* 10.3*  HCT 26.7* 31.0* 31.9*  PLT 205 244 278    Coag's  Recent Labs Lab 12/13/16 0019 12/14/16 0021  INR 1.31 1.21    Sepsis Markers  Recent Labs Lab 12/12/16 2028 12/12/16 2043 12/13/16 0613 12/14/16 0021 12/15/16 0506  LATICACIDVEN 1.30 1.1  --    --   --   PROCALCITON  --   --  1.85 1.05 0.63    ABG  Recent Labs Lab 12/13/16 0100  PHART 7.305*  PCO2ART 51.8*  PO2ART 110*    Liver Enzymes  Recent Labs Lab 12/14/16 0021 12/15/16 0820 12/16/16 0210  AST 314* 116* 111*  ALT 704* 497* 472*  ALKPHOS 112 112 112  BILITOT 0.5 0.3 0.5  ALBUMIN 2.4* 2.6* 2.8*    Cardiac Enzymes  Recent Labs Lab 12/13/16 0019 12/13/16 0613 12/13/16 1023  TROPONINI 1.25* 0.97* 0.93*    Glucose  Recent Labs Lab 12/15/16 0737 12/15/16 1144 12/15/16 1551 12/15/16 2018 12/15/16 2319 12/16/16 0344  GLUCAP 133* 149* 140* 123* 139* 180*    Imaging No results found.   STUDIES:  EKG 1/19>> Sinus tachycardia, T wave inversion in inferior leads which are new compared to previous EKG, QTc 620 CXR 1/19>> Consolidations bilaterally CT head 1/19>> Left occipital lobe probable acute or early subacute infarction. No acute intracranial hemorrhage. MRI brain >> sm cluster acute infarcts in L cerebellum, old left frontal infarct Carotid u/s- no stenosis RUQ U/S- neg CXR 1/23>> new area of cavitation in lateral mid to lower L lung   CULTURES: Viral resp panel>> neg Resp Cx>> Abundant strep pneumo Blood  Cx>>  ANTIBIOTICS: Ceftriaxone 1/19>> Azithromycin 1/19>>1/22  SIGNIFICANT EVENTS: 1/19>> Intubated, admitted  LINES/TUBES: ETT 1/19>> Foley 1/19>> OG 1/19>>  DISCUSSION: 51 year old woman with 45 pack year smoking history and asthma, allergic rhinitis, COPD, HTN, Bipolar d/o presenting with dyspnea found to have acute asthma exacerbation, acute hypoxic respiratory failure and multilobar pneumonia on CXR.  ASSESSMENT / PLAN:  PULMONARY A: Acute asthma exacerbation Acute hypoxic respiratory failure COPD Smoker Allergic rhinitis Multilobar pneumonia ?lung abscess P:   PS trials Duonebs scheduled Abx per below Solumedrol 60mg  daily CT chest w/o contrast  CARDIOVASCULAR A:  HTN Abnormal EKG Demand ischemia,  resolvig Echo- Hyperdynamic EF (65-70%), grade 2 diastolic dysfunction P:  Hold off on statin for now Cardiac monitoring Heparin gtt d/c'd  RENAL A:   AKI resolved P:   Follow BMP Correct electrolytes as needed  GASTROINTESTINAL A:   Ischemic hepatitis >> AST 2024, ALT 1366, trending down P:   NPO Pepcid for SUP Acute hepatitis panel neg TF  HEMATOLOGIC A:   Leukocytosis >> 19.2>16 Acute Anemia >> Hgb 10.4 on admission. Baseline around 12.  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  - Inter-disciplinary family meet or Palliative Care meeting due by:  1/27  Jacques Earthly, MD Internal Medicine Resident, PGY-III Pager: [phone] 12/16/2016, 7:38 AM

## 2016-12-16 NOTE — Progress Notes (Signed)
RT assisted with transferring PT from [facility] 2M11 to [facility] CT- uneventful. PT was transferred on 100% Fi02. PT now resides back in [facility] 2M11- with RN at bedside.

## 2016-12-17 ENCOUNTER — Inpatient Hospital Stay (HOSPITAL_COMMUNITY): Payer: Medicaid Other

## 2016-12-17 DIAGNOSIS — J13 Pneumonia due to Streptococcus pneumoniae: Secondary | ICD-10-CM

## 2016-12-17 LAB — CULTURE, BLOOD (ROUTINE X 2)
CULTURE: NO GROWTH
Culture: NO GROWTH

## 2016-12-17 LAB — BASIC METABOLIC PANEL
Anion gap: 9 (ref 5–15)
BUN: 25 mg/dL — AB (ref 6–20)
CALCIUM: 8.7 mg/dL — AB (ref 8.9–10.3)
CO2: 31 mmol/L (ref 22–32)
Chloride: 104 mmol/L (ref 101–111)
Creatinine, Ser: 0.6 mg/dL (ref 0.44–1.00)
GFR calc non Af Amer: >60 mL/min (ref >60)
Glucose, Bld: 172 mg/dL — ABNORMAL HIGH (ref 65–99)
Potassium: 4 mmol/L (ref 3.5–5.1)
SODIUM: 144 mmol/L (ref 135–145)

## 2016-12-17 LAB — CBC WITH DIFFERENTIAL/PLATELET
BASOS ABS: 0 10*3/uL (ref 0.0–0.1)
Basophils Relative: 0 %
EOS ABS: 0 10*3/uL (ref 0.0–0.7)
Eosinophils Relative: 0 %
HCT: 33 % — ABNORMAL LOW (ref 36.0–46.0)
Hemoglobin: 10.4 g/dL — ABNORMAL LOW (ref 12.0–15.0)
LYMPHS PCT: 5 %
Lymphs Abs: 0.7 10*3/uL (ref 0.7–4.0)
MCH: 29 pg (ref 26.0–34.0)
MCHC: 31.5 g/dL (ref 30.0–36.0)
MCV: 91.9 fL (ref 78.0–100.0)
Monocytes Absolute: 0.8 10*3/uL (ref 0.1–1.0)
Monocytes Relative: 6 %
NEUTROS PCT: 89 %
Neutro Abs: 12.6 10*3/uL — ABNORMAL HIGH (ref 1.7–7.7)
PLATELETS: 281 10*3/uL (ref 150–400)
RBC: 3.59 MIL/uL — ABNORMAL LOW (ref 3.87–5.11)
RDW: 15.7 % — ABNORMAL HIGH (ref 11.5–15.5)
WBC: 14.1 10*3/uL — AB (ref 4.0–10.5)

## 2016-12-17 LAB — GLUCOSE, CAPILLARY
GLUCOSE-CAPILLARY: 118 mg/dL — AB (ref 65–99)
GLUCOSE-CAPILLARY: 137 mg/dL — AB (ref 65–99)
GLUCOSE-CAPILLARY: 139 mg/dL — AB (ref 65–99)
GLUCOSE-CAPILLARY: 141 mg/dL — AB (ref 65–99)
GLUCOSE-CAPILLARY: 193 mg/dL — AB (ref 65–99)
Glucose-Capillary: 122 mg/dL — ABNORMAL HIGH (ref 65–99)
Glucose-Capillary: 126 mg/dL — ABNORMAL HIGH (ref 65–99)

## 2016-12-17 LAB — PHOSPHORUS: Phosphorus: 4.6 mg/dL (ref 2.5–4.6)

## 2016-12-17 LAB — MAGNESIUM: Magnesium: 1.5 mg/dL — ABNORMAL LOW (ref 1.7–2.4)

## 2016-12-17 MED ORDER — FENTANYL CITRATE (PF) 100 MCG/2ML IJ SOLN
100.0000 ug | INTRAMUSCULAR | Status: AC | PRN
  Administered 2016-12-17 (×3): 100 ug via INTRAVENOUS
  Filled 2016-12-17 (×3): qty 2

## 2016-12-17 MED ORDER — MAGNESIUM SULFATE 4 GM/100ML IV SOLN
4.0000 g | Freq: Once | INTRAVENOUS | Status: AC
  Administered 2016-12-17: 4 g via INTRAVENOUS
  Filled 2016-12-17: qty 100

## 2016-12-17 MED ORDER — VITAL AF 1.2 CAL PO LIQD
1000.0000 mL | ORAL | Status: DC
  Administered 2016-12-18 – 2016-12-20 (×3): 1000 mL
  Filled 2016-12-17 (×3): qty 1000

## 2016-12-17 MED ORDER — FENTANYL CITRATE (PF) 100 MCG/2ML IJ SOLN
100.0000 ug | INTRAMUSCULAR | Status: DC | PRN
  Administered 2016-12-17 – 2016-12-19 (×10): 100 ug via INTRAVENOUS
  Filled 2016-12-17 (×11): qty 2

## 2016-12-17 MED ORDER — FENTANYL CITRATE (PF) 100 MCG/2ML IJ SOLN: 12.5000 ug | INTRAMUSCULAR | Status: DC | PRN

## 2016-12-17 MED ORDER — SODIUM CHLORIDE 0.9 % IV SOLN
3.0000 g | Freq: Four times a day (QID) | INTRAVENOUS | Status: DC
  Administered 2016-12-17 – 2016-12-26 (×37): 3 g via INTRAVENOUS
  Filled 2016-12-17 (×44): qty 3

## 2016-12-17 NOTE — Progress Notes (Addendum)
PULMONARY / CRITICAL CARE MEDICINE   Name: [name] MRN: [id number] DOB: Apr 08, 1966    ADMISSION DATE:  12/12/2016 CONSULTATION DATE:  12/12/2016  REFERRING MD:  Alvino Chapel, MD (EDP)   SUBJECTIVE:  Still has agitation issues. Remains intubated and sedated  VITAL SIGNS: BP 124/69   Pulse 75   Temp (!) 100.4 F (38 C) (Oral)   Resp 16   Ht 5\' 4"  (1.626 m)   Wt 168 lb 6.9 oz (76.4 kg)   SpO2 100%   BMI 28.91 kg/m   VENTILATOR SETTINGS: Vent Mode: PRVC FiO2 (%):  [40 %-60 %] 40 % Set Rate:  [16 bmp] 16 bmp Vt Set:  [420 mL] 420 mL PEEP:  [5 cmH20] 5 cmH20 Pressure Support:  [5 cmH20] 5 cmH20 Plateau Pressure:  [11 cmH20-15 cmH20] 14 cmH20  INTAKE / OUTPUT: I/O last 3 completed shifts: In: 4134.5 [I.V.:1194.5; Other:250; NG/GT:2390; IV [id number] Out: [id number] [Urine:4570; Stool:325]  PHYSICAL EXAMINATION: General:  Middle aged woman sedated on vent, NAD Neuro:  Sedated on vent HEENT:  PERRL bilaterally, dry mucous membranes Cardiovascular:  RRR, no m/g/r Lungs:  Clear bilaterally, no wheezes Abdomen:  Soft, nontender, nondistended Musculoskeletal:  No LE edema Skin:  Warm and well perfused  LABS:  BMET  Recent Labs Lab 12/15/16 0506 12/16/16 0210 12/17/16 0252  NA 141 139 144  K 4.6 4.1 4.0  CL 107 102 104  CO2 26 28 31   BUN 22* 28* 25*  CREATININE 0.86 0.80 0.60  GLUCOSE 119* 175* 172*    Electrolytes  Recent Labs Lab 12/15/16 0506 12/16/16 0210 12/17/16 0252  CALCIUM 8.7* 8.9 8.7*  MG 2.0 1.8 1.5*  PHOS 2.4* 4.6 4.6    CBC  Recent Labs Lab 12/15/16 0506 12/16/16 0210 12/17/16 0252  WBC 16.4* 14.2* 14.1*  HGB 9.6* 10.3* 10.4*  HCT 31.0* 31.9* 33.0*  PLT 244 278 281    Coag's  Recent Labs Lab 12/13/16 0019 12/14/16 0021  INR 1.31 1.21    Sepsis Markers  Recent Labs Lab 12/12/16 2028 12/12/16 2043 12/13/16 0613 12/14/16 0021 12/15/16 0506  LATICACIDVEN 1.30 1.1  --   --   --   PROCALCITON  --   --  1.85 1.05  0.63    ABG  Recent Labs Lab 12/13/16 0100  PHART 7.305*  PCO2ART 51.8*  PO2ART 110*    Liver Enzymes  Recent Labs Lab 12/14/16 0021 12/15/16 0820 12/16/16 0210  AST 314* 116* 111*  ALT 704* 497* 472*  ALKPHOS 112 112 112  BILITOT 0.5 0.3 0.5  ALBUMIN 2.4* 2.6* 2.8*    Cardiac Enzymes  Recent Labs Lab 12/13/16 0019 12/13/16 0613 12/13/16 1023  TROPONINI 1.25* 0.97* 0.93*    Glucose  Recent Labs Lab 12/16/16 1147 12/16/16 1551 12/16/16 2000 12/17/16 0008 12/17/16 0423 12/17/16 0735  GLUCAP 121* 136* 112* 137* 193* 122*    Imaging Ct Chest Wo Contrast  Result Date: 12/16/2016 CLINICAL DATA:  Evaluate cavitary structure in left lung. EXAM: CT CHEST WITHOUT CONTRAST TECHNIQUE: Multidetector CT imaging of the chest was performed following the standard protocol without IV contrast. COMPARISON:  Chest radiograph 12/16/2016.  Chest CT 12/12/2015 FINDINGS: Cardiovascular: Atherosclerotic calcifications the aortic arch. Normal caliber of the thoracic aorta. Mediastinum/Nodes: Nasogastric tube in the esophagus. Mild enlargement of prevascular lymph nodes, largest measuring 0.9 cm in the short axis on sequence 3, image 45. Otherwise, there is no significant chest lymphadenopathy. Limited evaluation for hilar lymphadenopathy on this noncontrast examination. Lungs/Pleura: Endotracheal tube  in the trachea. There are underlying emphysematous changes with patchy parenchymal densities in the upper lungs. There are patchy parenchymal densities in the right middle lobe. Small amount of pleural fluid in the posterior left lung base with focal atelectasis or consolidation the posterior left lower lobe. There is a peripheral cavitary lesion in the left upper lobe/ lingular region. This cavitary lesion is new from the previous CT and measures 5.9 x 4.1 x 4.3 cm. A small amount of fluid within this cavitary lesion. There is concern for a small nodular lesion in left lower lobe with  minimal cavitation. This is seen on sequence 7, image 96 and measures roughly 1.0 cm. Upper Abdomen: Nasogastric tube extends in the stomach. Gallbladder has been surgically removed. Normal appearance of the adrenal glands. Musculoskeletal: Old bilateral rib fractures. IMPRESSION: New cavitary lesion in the left upper lobe, measuring up to 5.9 cm. There is a small amount of fluid in this structure. Findings are suggestive for a lung abscess but a neoplastic process cannot be completely excluded. There is also a 1 cm nodular structure in the left lower lobe with minimal cavitation. This 1 cm nodule could also represent an infectious or inflammatory process. Emphysema with scattered areas of parenchymal disease and interstitial thickening throughout the upper lobes and in the right middle lobe. Findings could be related to edema and/or infection. Small amount of pleural fluid with volume loss or consolidation along the posterior left lower lobe. Slightly enlarged prevascular lymph nodes. These could be reactive in etiology. Electronically Signed   By: Markus Daft M.D.   On: 12/16/2016 19:38   Dg Chest Port 1 View  Result Date: 12/16/2016 CLINICAL DATA:  Asthma, COPD, intubation, hypertension, smoker EXAM: PORTABLE CHEST 1 VIEW COMPARISON:  Portable exam 0810 hours compared 12/14/2016 FINDINGS: Tip of endotracheal tube projects 3.3 cm above carina. Nasogastric tube extends into stomach. Upper normal heart size. Mediastinal contours and pulmonary vascularity normal. New large cavitary lesion within the lateral mid to lower LEFT chest at site of prior dense consolidation likely represents a lung abscess, 4.9 x 4.2 cm. Persistent infiltrate throughout the mid to lower LEFT lung again seen. Mild infiltrate lateral RIGHT base again noted. No definite pleural effusion or pneumothorax. Healing fractures of lower lateral RIGHT ribs again noted. IMPRESSION: Persistent infiltrates in mid to lower LEFT lung and at lateral  RIGHT lung base with new area of cavitation at the lateral mid to lower LEFT lung consistent with a lung abscess. Electronically Signed   By: Lavonia Dana M.D.   On: 12/16/2016 08:33     STUDIES:  EKG 1/19>> Sinus tachycardia, T wave inversion in inferior leads which are new compared to previous EKG, QTc 620 CXR 1/19>> Consolidations bilaterally CT head 1/19>> Left occipital lobe probable acute or early subacute infarction. No acute intracranial hemorrhage. 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 There is a small amount of fluid in this structure. Also a 1 cm nodular structure in the left lower lobe with minimal cavitation.   CULTURES: Viral resp panel>> neg Resp Cx>> Abundant strep pneumo Blood Cx>>  ANTIBIOTICS/VIRALS: Ceftriaxone 1/19>> Azithromycin 1/19>>1/22 Acyclovir 1/22>>  SIGNIFICANT EVENTS: 1/19>> Intubated, admitted  LINES/TUBES: ETT 1/19>> Foley 1/19>> OG 1/19>>  DISCUSSION: 51 year old woman with 45 pack year  smoking history and asthma, allergic rhinitis, COPD, HTN, Bipolar d/o presenting with dyspnea found to have acute asthma exacerbation, acute hypoxic respiratory failure and multilobar pneumonia on CXR.  ASSESSMENT / PLAN:  PULMONARY A: Acute asthma exacerbation Acute hypoxic respiratory failure COPD Smoker Allergic rhinitis Multilobar pneumonia ?lung abscess P:   PS trials Duonebs scheduled Abx per below Solumedrol 60mg  daily  CARDIOVASCULAR A:  HTN Abnormal EKG Demand ischemia, resolving Grade 2 diastolic dysfunction P:  Hold off on statin for now Cardiac monitoring  RENAL A:   AKI resolved P:   Follow BMP Correct electrolytes as needed  GASTROINTESTINAL A:   Ischemic hepatitis >> AST 2024, ALT 1366, trending down, acute  hepatitis panel neg P:   NPO Pepcid for SUP TF  HEMATOLOGIC A:   Leukocytosis >> improving Acute Anemia >> Hgb 10.4 on admission. Baseline around 12.  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  - Inter-disciplinary family meet or Palliative Care meeting due by:  1/27  Jacques Earthly, MD Internal Medicine Resident, PGY-III Pager: [phone] 12/17/2016, 7:55 AM   Attending note: I have seen and examined the patient with nurse practitioner/resident and agree with the note. History, labs and imaging reviewed.  51 year old with severe persistent asthma, COPD, hypertension, bipolar. Admitted with acute hypoxic respiratory failure, multilobar Streptococcus pneumonia.  Blood pressure (!) 147/79, pulse 82, temperature (!) 100.4 F (38 C), temperature source Oral, resp. rate 17, height 5\' 4"  (1.626 m), weight 168 lb 6.9 oz (76.4 kg), SpO2 96 %. Gen:      No acute distress HEENT:  EOMI, sclera anicteric Neck:     No masses; no thyromegaly Lungs:    Clear to auscultation bilaterally; normal respiratory effort CV:         Regular rate and rhythm; no murmurs Abd:      + bowel sounds; soft, non-tender; no palpable masses, no distension Ext:    No edema; adequate peripheral perfusion Skin:      Warm and dry; no rash Neuro: Sedated, unresponsive Psych: normal mood and affect  Impression/Plan: Acute hypoxic respiratory failure secondary to Streptococcus  pneumonia History of asthma, COPD Small cerebellar infarct Elevated troponins History of present illness, elevated LFTs-improving Mouth sores suspicious for HSV infection.   CT scan reviewed with new right cavitary lesion in left upper lobe.    - Doing ok on weaning trials but mental status continues to be a barrier to extubation. - Wean precedex to off.  - Continue nebs, steroids - Change antibiotics to unasyn for coverage of anaerobes due to new findings of lung cavitation.  The patient is critically ill with multiple organ systems failure and requires high complexity decision making for assessment and support, frequent evaluation and titration of therapies, application of advanced monitoring technologies and extensive interpretation of multiple databases.  Critical care time - 35 mins. This represents my time independent of the NPs time taking care of the pt.  Marshell Garfinkel MD [place]  [facility] Pager [phone] If no answer or after 3pm call: [phone].

## 2016-12-18 ENCOUNTER — Inpatient Hospital Stay (HOSPITAL_COMMUNITY): Payer: Medicaid Other

## 2016-12-18 LAB — GLUCOSE, CAPILLARY
GLUCOSE-CAPILLARY: 170 mg/dL — AB (ref 65–99)
GLUCOSE-CAPILLARY: 127 mg/dL — AB (ref 65–99)
GLUCOSE-CAPILLARY: 128 mg/dL — AB (ref 65–99)
Glucose-Capillary: 134 mg/dL — ABNORMAL HIGH (ref 65–99)
Glucose-Capillary: 139 mg/dL — ABNORMAL HIGH (ref 65–99)
Glucose-Capillary: 151 mg/dL — ABNORMAL HIGH (ref 65–99)

## 2016-12-18 LAB — HSV CULTURE AND TYPING

## 2016-12-18 LAB — MAGNESIUM: Magnesium: 2.1 mg/dL (ref 1.7–2.4)

## 2016-12-18 LAB — PHOSPHORUS: PHOSPHORUS: 3.4 mg/dL (ref 2.5–4.6)

## 2016-12-18 LAB — BASIC METABOLIC PANEL
ANION GAP: 10 (ref 5–15)
BUN: 21 mg/dL — ABNORMAL HIGH (ref 6–20)
CHLORIDE: 105 mmol/L (ref 101–111)
CO2: 34 mmol/L — AB (ref 22–32)
Calcium: 8.8 mg/dL — ABNORMAL LOW (ref 8.9–10.3)
Creatinine, Ser: 0.49 mg/dL (ref 0.44–1.00)
GFR calc non Af Amer: >60 mL/min (ref >60)
Glucose, Bld: 167 mg/dL — ABNORMAL HIGH (ref 65–99)
POTASSIUM: 4.2 mmol/L (ref 3.5–5.1)
Sodium: 149 mmol/L — ABNORMAL HIGH (ref 135–145)

## 2016-12-18 MED ORDER — CLONAZEPAM 1 MG PO TBDP
1.0000 mg | ORAL_TABLET | Freq: Two times a day (BID) | ORAL | Status: DC
  Administered 2016-12-18 – 2016-12-22 (×9): 1 mg
  Filled 2016-12-18 (×9): qty 2

## 2016-12-18 MED ORDER — SODIUM CHLORIDE 0.9 % IV SOLN
INTRAVENOUS | Status: DC
  Administered 2016-12-20: 11:00:00 via INTRAVENOUS

## 2016-12-18 MED ORDER — ACETAMINOPHEN 325 MG PO TABS
650.0000 mg | ORAL_TABLET | Freq: Four times a day (QID) | ORAL | Status: DC | PRN
  Administered 2016-12-18 – 2016-12-20 (×3): 650 mg via ORAL
  Filled 2016-12-18 (×3): qty 2

## 2016-12-18 MED ORDER — QUETIAPINE FUMARATE 50 MG PO TABS
150.0000 mg | ORAL_TABLET | Freq: Two times a day (BID) | ORAL | Status: DC
  Administered 2016-12-18 – 2016-12-22 (×9): 150 mg
  Filled 2016-12-18: qty 3
  Filled 2016-12-18 (×2): qty 1
  Filled 2016-12-18 (×2): qty 3
  Filled 2016-12-18 (×6): qty 1

## 2016-12-18 MED ORDER — METHYLPREDNISOLONE SODIUM SUCC 40 MG IJ SOLR
40.0000 mg | INTRAMUSCULAR | Status: AC
  Administered 2016-12-18: 40 mg via INTRAVENOUS
  Filled 2016-12-18: qty 1

## 2016-12-18 NOTE — Progress Notes (Signed)
I called [name]'s daughter and updated her on her mother's status. Discussed patient's agitation and barrier to extubation. Also discussed CT chest results and that patient would need to be on antibiotics at least 3 weeks. She had no further questions.   Jacques Earthly, MD  Internal Medicine PGY-3 Pager: [phone]

## 2016-12-18 NOTE — Procedures (Signed)
Pt placed back on full support at this time due to increased WOB & agitation.  RT will monitor and attempt wean later as tolerated

## 2016-12-18 NOTE — Progress Notes (Signed)
PULMONARY / CRITICAL CARE MEDICINE   Name: Kierstynn MAEKAYLA NORBUT MRN: [id number] DOB: 05/24/1966    ADMISSION DATE:  12/12/2016 CONSULTATION DATE:  12/12/2016  REFERRING MD:  Alvino Chapel, MD (EDP)   SUBJECTIVE:  Eyes open this morning and calm. Does not follow commands.  VITAL SIGNS: BP (!) 147/78   Pulse 73   Temp 99.8 F (37.7 C) (Oral)   Resp 16   Ht 5\' 4"  (1.626 m)   Wt 168 lb (76.2 kg)   SpO2 95%   BMI 28.84 kg/m   VENTILATOR SETTINGS: Vent Mode: PRVC FiO2 (%):  [40 %] 40 % Set Rate:  [16 bmp] 16 bmp Vt Set:  [420 mL] 420 mL PEEP:  [5 cmH20] 5 cmH20 Pressure Support:  [8 cmH20] 8 cmH20 Plateau Pressure:  [12 cmH20-13 cmH20] 13 cmH20  INTAKE / OUTPUT: I/O last 3 completed shifts: In: 3187.9 [I.V.:617.9; Other:120; NG/GT:1850; IV [id number] Out: [id number]; Stool:325]  PHYSICAL EXAMINATION: General:  Middle aged woman sedated on vent, NAD Neuro:  Awake but does not track. Does not follow commands HEENT:  PERRL bilaterally, dry mucous membranes Cardiovascular:  RRR, no m/g/r Lungs:  Clear bilaterally, no wheezes Abdomen:  Soft, nontender, nondistended Musculoskeletal:  No LE edema Skin:  Warm and well perfused  LABS:  BMET  Recent Labs Lab 12/16/16 0210 12/17/16 0252 12/18/16 0336  NA 139 144 149*  K 4.1 4.0 4.2  CL 102 104 105  CO2 28 31 34*  BUN 28* 25* 21*  CREATININE 0.80 0.60 0.49  GLUCOSE 175* 172* 167*    Electrolytes  Recent Labs Lab 12/16/16 0210 12/17/16 0252 12/18/16 0336  CALCIUM 8.9 8.7* 8.8*  MG 1.8 1.5* 2.1  PHOS 4.6 4.6 3.4    CBC  Recent Labs Lab 12/15/16 0506 12/16/16 0210 12/17/16 0252  WBC 16.4* 14.2* 14.1*  HGB 9.6* 10.3* 10.4*  HCT 31.0* 31.9* 33.0*  PLT 244 278 281    Coag's  Recent Labs Lab 12/13/16 0019 12/14/16 0021  INR 1.31 1.21    Sepsis Markers  Recent Labs Lab 12/12/16 2028 12/12/16 2043 12/13/16 0613 12/14/16 0021 12/15/16 0506  LATICACIDVEN 1.30 1.1  --   --   --    PROCALCITON  --   --  1.85 1.05 0.63    ABG  Recent Labs Lab 12/13/16 0100  PHART 7.305*  PCO2ART 51.8*  PO2ART 110*    Liver Enzymes  Recent Labs Lab 12/14/16 0021 12/15/16 0820 12/16/16 0210  AST 314* 116* 111*  ALT 704* 497* 472*  ALKPHOS 112 112 112  BILITOT 0.5 0.3 0.5  ALBUMIN 2.4* 2.6* 2.8*    Cardiac Enzymes  Recent Labs Lab 12/13/16 0019 12/13/16 0613 12/13/16 1023  TROPONINI 1.25* 0.97* 0.93*    Glucose  Recent Labs Lab 12/17/16 0735 12/17/16 1207 12/17/16 1552 12/17/16 2000 12/17/16 2334 12/18/16 0357  GLUCAP 122* 118* 139* 126* 141* 170*    Imaging Dg Chest Port 1 View  Result Date: 12/18/2016 CLINICAL DATA:  Intubated patient, respiratory failure, asthma -COPD, CHF, current smoker. EXAM: PORTABLE CHEST 1 VIEW COMPARISON:  Portable chest x-ray of December 17, 2016 FINDINGS: The lungs are reasonably well inflated. There is persistent small left pleural effusion and cavitary lesion in the left mid lung. The right lung exhibits mild interstitial prominence inferiorly. The heart is normal in size. The pulmonary vascularity is not engorged. The endotracheal tube tip projects 4 cm above the carina. The esophagogastric tube tip projects below the inferior margin of the image.  IMPRESSION: Persistent left mid lung cavitary lesion. A small left pleural effusion is slightly more conspicuous today. The support tubes are in reasonable position. Electronically Signed   By: David  Martinique M.D.   On: 12/18/2016 07:08     STUDIES:  EKG 1/19>> Sinus tachycardia, T wave inversion in inferior leads which are new compared to previous EKG, QTc 620 CXR 1/19>> Consolidations bilaterally CT head 1/19>> Left occipital lobe probable acute or early subacute infarction. No acute intracranial hemorrhage. MRI brain >> sm cluster acute infarcts in L cerebellum, old left frontal infarct Carotid u/s- no stenosis RUQ U/S- neg CXR 1/23>> new area of cavitation in lateral mid  to lower L lung  CT chest 1/23>> New cavitary lesion LUL, measuring up to 5.9 cm. There is a small amount of fluid in this structure. Also a 1 cm nodular structure in the left lower lobe with minimal cavitation.   CULTURES: Viral resp panel>> neg Resp Cx>> Abundant strep pneumo Blood Cx 1/19>> NGTD Blood Cx 1/24>>  ANTIBIOTICS/VIRALS: Ceftriaxone 1/19>>1/24 Azithromycin 1/19>>1/22 Acyclovir 1/22>> Unasyn 1/24>>  SIGNIFICANT EVENTS: 1/19>> Intubated, admitted  LINES/TUBES: ETT 1/19>> Foley 1/19>> OG 1/19>>  DISCUSSION: 51 year old woman with 45 pack year smoking history and asthma, allergic rhinitis, COPD, HTN, Bipolar d/o presenting with dyspnea found to have acute asthma exacerbation, acute hypoxic respiratory failure and multilobar pneumonia on CXR.  ASSESSMENT / PLAN:  PULMONARY A: Acute asthma exacerbation Acute hypoxic respiratory failure COPD Smoker Allergic rhinitis Multilobar pneumonia ?lung abscess P:   PS trials Duonebs scheduled Abx per below Solumedrol 60mg  daily  CARDIOVASCULAR A:  HTN Abnormal EKG Demand ischemia, resolving Grade 2 diastolic dysfunction P:  Hold off on statin for now Cardiac monitoring  RENAL A:   AKI resolved P:   Follow BMP Correct electrolytes as needed  GASTROINTESTINAL A:   Ischemic hepatitis >> AST 2024, ALT 1366, trending down, acute hepatitis panel neg P:   NPO Pepcid for SUP TF  HEMATOLOGIC A:   Leukocytosis >> improving Acute Anemia >> Hgb 10.4 on admission. Baseline around 12.  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  - Inter-disciplinary family meet or Palliative Care meeting due by:  1/27  Jacques Earthly, MD Internal Medicine Resident, PGY-III Pager: [phone] 12/18/2016, 7:19 AM

## 2016-12-18 NOTE — Procedures (Signed)
Pt placed on PS trial at this time, inc PS to 10 due to decreased VT's. If goal is to extubate today will wean PS down as tolerated, RT will monitor

## 2016-12-19 ENCOUNTER — Inpatient Hospital Stay (HOSPITAL_COMMUNITY): Payer: Medicaid Other

## 2016-12-19 DIAGNOSIS — L8990 Pressure ulcer of unspecified site, unspecified stage: Secondary | ICD-10-CM | POA: Diagnosis not present

## 2016-12-19 LAB — GLUCOSE, CAPILLARY
GLUCOSE-CAPILLARY: 139 mg/dL — AB (ref 65–99)
GLUCOSE-CAPILLARY: 119 mg/dL — AB (ref 65–99)
GLUCOSE-CAPILLARY: 163 mg/dL — AB (ref 65–99)
Glucose-Capillary: 136 mg/dL — ABNORMAL HIGH (ref 65–99)
Glucose-Capillary: 165 mg/dL — ABNORMAL HIGH (ref 65–99)

## 2016-12-19 LAB — PHOSPHORUS: PHOSPHORUS: 2.9 mg/dL (ref 2.5–4.6)

## 2016-12-19 LAB — BASIC METABOLIC PANEL
Anion gap: 11 (ref 5–15)
Anion gap: 11 (ref 5–15)
BUN: 21 mg/dL — ABNORMAL HIGH (ref 6–20)
BUN: 26 mg/dL — ABNORMAL HIGH (ref 6–20)
CALCIUM: 8.8 mg/dL — AB (ref 8.9–10.3)
CALCIUM: 9.1 mg/dL (ref 8.9–10.3)
CHLORIDE: 108 mmol/L (ref 101–111)
CO2: 31 mmol/L (ref 22–32)
CO2: 29 mmol/L (ref 22–32)
CREATININE: 0.53 mg/dL (ref 0.44–1.00)
CREATININE: 0.6 mg/dL (ref 0.44–1.00)
Chloride: 109 mmol/L (ref 101–111)
GFR calc Af Amer: >60 mL/min (ref >60)
GFR calc non Af Amer: >60 mL/min (ref >60)
GFR calc non Af Amer: >60 mL/min (ref >60)
Glucose, Bld: 177 mg/dL — ABNORMAL HIGH (ref 65–99)
Glucose, Bld: 155 mg/dL — ABNORMAL HIGH (ref 65–99)
Potassium: 3.7 mmol/L (ref 3.5–5.1)
Potassium: 4 mmol/L (ref 3.5–5.1)
SODIUM: 150 mmol/L — AB (ref 135–145)
SODIUM: 149 mmol/L — AB (ref 135–145)

## 2016-12-19 LAB — CBC
HCT: 35.7 % — ABNORMAL LOW (ref 36.0–46.0)
HEMOGLOBIN: 10.8 g/dL — AB (ref 12.0–15.0)
MCH: 28.6 pg (ref 26.0–34.0)
MCHC: 30.3 g/dL (ref 30.0–36.0)
MCV: 94.7 fL (ref 78.0–100.0)
PLATELETS: 355 10*3/uL (ref 150–400)
RBC: 3.77 MIL/uL — AB (ref 3.87–5.11)
RDW: 16.3 % — ABNORMAL HIGH (ref 11.5–15.5)
WBC: 19.2 10*3/uL — ABNORMAL HIGH (ref 4.0–10.5)

## 2016-12-19 LAB — MAGNESIUM: Magnesium: 2.1 mg/dL (ref 1.7–2.4)

## 2016-12-19 MED ORDER — PREDNISONE 5 MG/ML PO CONC
30.0000 mg | Freq: Every day | ORAL | Status: AC
  Administered 2016-12-19 – 2016-12-20 (×2): 30 mg
  Filled 2016-12-19 (×2): qty 6

## 2016-12-19 MED ORDER — FENTANYL CITRATE (PF) 100 MCG/2ML IJ SOLN
100.0000 ug | INTRAMUSCULAR | Status: AC | PRN
  Administered 2016-12-19 (×3): 100 ug via INTRAVENOUS
  Filled 2016-12-19 (×3): qty 2

## 2016-12-19 MED ORDER — FREE WATER
350.0000 mL | Freq: Four times a day (QID) | Status: DC
  Administered 2016-12-19 – 2016-12-20 (×5): 350 mL

## 2016-12-19 MED ORDER — DEXTROSE 5 % IV SOLN
INTRAVENOUS | Status: DC
  Administered 2016-12-19: 10:00:00 via INTRAVENOUS

## 2016-12-19 MED ORDER — DEXMEDETOMIDINE HCL IN NACL 400 MCG/100ML IV SOLN
0.0000 ug/kg/h | INTRAVENOUS | Status: DC
  Administered 2016-12-19: 0.5 ug/kg/h via INTRAVENOUS
  Administered 2016-12-19: 1.001 ug/kg/h via INTRAVENOUS
  Administered 2016-12-19: 1.2 ug/kg/h via INTRAVENOUS
  Administered 2016-12-20: 1 ug/kg/h via INTRAVENOUS
  Administered 2016-12-20: 0.9 ug/kg/h via INTRAVENOUS
  Filled 2016-12-19 (×4): qty 100

## 2016-12-19 MED ORDER — FENTANYL CITRATE (PF) 100 MCG/2ML IJ SOLN
100.0000 ug | INTRAMUSCULAR | Status: DC | PRN
  Administered 2016-12-19 (×2): 100 ug via INTRAVENOUS
  Filled 2016-12-19 (×2): qty 2

## 2016-12-19 MED ORDER — FENTANYL CITRATE (PF) 100 MCG/2ML IJ SOLN: 12.5000 ug | INTRAMUSCULAR | Status: DC | PRN

## 2016-12-19 MED ORDER — FUROSEMIDE 10 MG/ML IJ SOLN
40.0000 mg | Freq: Once | INTRAMUSCULAR | Status: AC
  Administered 2016-12-19: 40 mg via INTRAVENOUS
  Filled 2016-12-19: qty 4

## 2016-12-19 MED ORDER — DEXAMETHASONE SODIUM PHOSPHATE 10 MG/ML IJ SOLN
4.0000 mg | Freq: Four times a day (QID) | INTRAMUSCULAR | Status: AC
  Administered 2016-12-19 – 2016-12-20 (×4): 4 mg via INTRAVENOUS
  Filled 2016-12-19 (×4): qty 0.4

## 2016-12-19 MED ORDER — PREDNISONE 5 MG/ML PO CONC
20.0000 mg | Freq: Every day | ORAL | Status: AC
  Administered 2016-12-21 – 2016-12-22 (×2): 20 mg
  Filled 2016-12-19 (×3): qty 4

## 2016-12-19 MED ORDER — PREDNISONE 5 MG/ML PO CONC
10.0000 mg | Freq: Every day | ORAL | Status: DC
  Filled 2016-12-19: qty 2

## 2016-12-19 NOTE — Progress Notes (Signed)
eLink Physician-Brief Progress Note Patient Name: [name] DOB: 05/23/1966 MRN: [id number]   Date of Service  12/19/2016  HPI/Events of Note  Vent alarms -RN asking for more sedation  eICU Interventions  Changed to PS - good TV Hence changed vent settings to SIMV/PS -back up RR 8     Intervention Category Major Interventions: Respiratory failure - evaluation and management  Kalonji Zurawski V. 12/19/2016, 5:17 PM

## 2016-12-19 NOTE — Care Management Note (Signed)
Case Management Note  Patient Details  Name: [name] MRN: [id number] Date of Birth: [birth year]  Subjective/Objective:  acute asthma exacerbation, acute hypoxic respiratory failure and multilobar pneumonia on CXR.   Action/Plan:  Per daughter; PTA independent at home with daughter.  Pt remains intubated.  CM will continue to follow for discharge needs   Expected Discharge Date:                  Expected Discharge Plan:  Home/Self Care  In-House Referral:     Discharge planning Services  CM Consult  Post Acute Care Choice:    Choice offered to:     DME Arranged:    DME Agency:     HH Arranged:    HH Agency:     Status of Service:  In process, will continue to follow  If discussed at Long Length of Stay Meetings, dates discussed:    Additional Comments:  Maryclare Labrador, RN 12/19/2016, 10:58 AM

## 2016-12-19 NOTE — Progress Notes (Signed)
Pt negative for cuff leak, not following commands at this time. Hold extubated per Dr. Vaughan Browner at 11:10am.

## 2016-12-19 NOTE — Progress Notes (Signed)
PULMONARY / CRITICAL CARE MEDICINE   Name: [name] MRN: [id number] DOB: 04/26/1966    ADMISSION DATE:  12/12/2016 CONSULTATION DATE:  12/12/2016  REFERRING MD:  Alvino Chapel, MD (EDP)   SUBJECTIVE:  Eyes open this morning and calm. Follows commands when sedation reduced  VITAL SIGNS: BP 133/72   Pulse 67   Temp 99 F (37.2 C) (Oral)   Resp 18   Ht 5\' 4"  (1.626 m)   Wt 168 lb 3.2 oz (76.3 kg)   SpO2 98%   BMI 28.87 kg/m   VENTILATOR SETTINGS: Vent Mode: PRVC FiO2 (%):  [40 %] 40 % Set Rate:  [16 bmp] 16 bmp Vt Set:  [420 mL] 420 mL PEEP:  [5 cmH20] 5 cmH20 Pressure Support:  [10 cmH20] 10 cmH20 Plateau Pressure:  [9 cmH20-13 cmH20] 13 cmH20  INTAKE / OUTPUT: I/O last 3 completed shifts: In: 3711.2 [I.V.:981.2; NG/GT:1980; IV [id number] Out: 2175 [Urine:2045; Stool:130]  PHYSICAL EXAMINATION: General:  Middle aged woman sedated on vent, NAD Neuro:  Awakens to voice. Follows commands HEENT:  PERRL bilaterally, dry mucous membranes Cardiovascular:  RRR, no m/g/r Lungs:  Clear bilaterally, no wheezes Abdomen:  Soft, nontender, nondistended Musculoskeletal:  No LE edema Skin:  Warm and well perfused  LABS:  BMET  Recent Labs Lab 12/17/16 0252 12/18/16 0336 12/19/16 0207  NA 144 149* 150*  K 4.0 4.2 3.7  CL 104 105 108  CO2 31 34* 31  BUN 25* 21* 21*  CREATININE 0.60 0.49 0.53  GLUCOSE 172* 167* 177*    Electrolytes  Recent Labs Lab 12/17/16 0252 12/18/16 0336 12/19/16 0207  CALCIUM 8.7* 8.8* 8.8*  MG 1.5* 2.1 2.1  PHOS 4.6 3.4 2.9    CBC  Recent Labs Lab 12/15/16 0506 12/16/16 0210 12/17/16 0252  WBC 16.4* 14.2* 14.1*  HGB 9.6* 10.3* 10.4*  HCT 31.0* 31.9* 33.0*  PLT 244 278 281    Coag's  Recent Labs Lab 12/13/16 0019 12/14/16 0021  INR 1.31 1.21    Sepsis Markers  Recent Labs Lab 12/12/16 2028 12/12/16 2043 12/13/16 0613 12/14/16 0021 12/15/16 0506  LATICACIDVEN 1.30 1.1  --   --   --   PROCALCITON  --    --  1.85 1.05 0.63    ABG  Recent Labs Lab 12/13/16 0100  PHART 7.305*  PCO2ART 51.8*  PO2ART 110*    Liver Enzymes  Recent Labs Lab 12/14/16 0021 12/15/16 0820 12/16/16 0210  AST 314* 116* 111*  ALT 704* 497* 472*  ALKPHOS 112 112 112  BILITOT 0.5 0.3 0.5  ALBUMIN 2.4* 2.6* 2.8*    Cardiac Enzymes  Recent Labs Lab 12/13/16 0019 12/13/16 0613 12/13/16 1023  TROPONINI 1.25* 0.97* 0.93*    Glucose  Recent Labs Lab 12/18/16 0835 12/18/16 1210 12/18/16 1516 12/18/16 1926 12/18/16 2346 12/19/16 0352  GLUCAP 127* 139* 128* 134* 151* 139*    Imaging No results found.   STUDIES:  EKG 1/19>> Sinus tachycardia, T wave inversion in inferior leads which are new compared to previous EKG, QTc 620 CXR 1/19>> Consolidations bilaterally CT head 1/19>> Left occipital lobe probable acute or early subacute infarction. No acute intracranial hemorrhage. 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 There is a small amount of fluid in this  structure. Also a 1 cm nodular structure in the left lower lobe with minimal cavitation.   CULTURES: Viral resp panel>> neg Resp Cx>> Abundant strep pneumo Blood Cx 1/19>> NGTD Blood Cx 1/24>> NGTD  ANTIBIOTICS/VIRALS: Ceftriaxone 1/19>>1/24 Azithromycin 1/19>>1/22 Acyclovir 1/22>> Unasyn 1/24>>  SIGNIFICANT EVENTS: 1/19>> Intubated, admitted  LINES/TUBES: ETT 1/19>> Foley 1/19>> OG 1/19>>  DISCUSSION: 51 year old woman with 45 pack year smoking history and asthma, allergic rhinitis, COPD, HTN, Bipolar d/o presenting with dyspnea found to have acute asthma exacerbation, acute hypoxic respiratory failure and multilobar pneumonia on CXR.  ASSESSMENT / PLAN:  PULMONARY A: Acute asthma exacerbation> resolving Acute  hypoxic respiratory failure COPD Smoker Allergic rhinitis Multilobar pneumonia ?lung abscess P:   PS trials Duonebs scheduled Abx per below Solumedrol decreased to 40mg  daily. Continue taper of steroids.  CARDIOVASCULAR A:  HTN Abnormal EKG Demand ischemia, resolving Grade 2 diastolic dysfunction P:  Hold off on statin for now Cardiac monitoring  RENAL A:   AKI resolved Hypernatremia P:   Follow BMP Correct electrolytes as needed Free water  GASTROINTESTINAL A:   Ischemic hepatitis >> AST 2024, ALT 1366, trending down, acute hepatitis panel neg P:   NPO Pepcid for SUP TF  HEMATOLOGIC A:   Leukocytosis >> improving Acute Anemia >> Hgb 10.4 on admission. Baseline around 12.  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

## 2016-12-19 NOTE — Procedures (Signed)
Vent changes made per MD order, pt tolerating well, R Twill monitor

## 2016-12-19 NOTE — Progress Notes (Signed)
SLP Cancellation Note  Patient Details Name: [name] MRN: [id number] DOB: 05/16/1966   Cancelled treatment:       Reason Eval/Treat Not Completed: Patient not medically ready. Per chart review, extubation on hold for today. Will f/u when ready for swallow eval.   Germain Osgood 12/19/2016, 11:15 AM  Germain Osgood, M.A. CCC-SLP [phone]

## 2016-12-20 ENCOUNTER — Inpatient Hospital Stay (HOSPITAL_COMMUNITY): Payer: Medicaid Other

## 2016-12-20 DIAGNOSIS — R740 Nonspecific elevation of levels of transaminase and lactic acid dehydrogenase [LDH]: Secondary | ICD-10-CM

## 2016-12-20 DIAGNOSIS — J189 Pneumonia, unspecified organism: Secondary | ICD-10-CM | POA: Insufficient documentation

## 2016-12-20 DIAGNOSIS — N179 Acute kidney failure, unspecified: Secondary | ICD-10-CM

## 2016-12-20 DIAGNOSIS — R7401 Elevation of levels of liver transaminase levels: Secondary | ICD-10-CM | POA: Diagnosis present

## 2016-12-20 LAB — GLUCOSE, CAPILLARY
GLUCOSE-CAPILLARY: 121 mg/dL — AB (ref 65–99)
GLUCOSE-CAPILLARY: 131 mg/dL — AB (ref 65–99)
GLUCOSE-CAPILLARY: 156 mg/dL — AB (ref 65–99)
Glucose-Capillary: 155 mg/dL — ABNORMAL HIGH (ref 65–99)
Glucose-Capillary: 152 mg/dL — ABNORMAL HIGH (ref 65–99)
Glucose-Capillary: 106 mg/dL — ABNORMAL HIGH (ref 65–99)

## 2016-12-20 LAB — COMPREHENSIVE METABOLIC PANEL
ALK PHOS: 81 U/L (ref 38–126)
ALT: 123 U/L — AB (ref 14–54)
AST: 30 U/L (ref 15–41)
Albumin: 2.6 g/dL — ABNORMAL LOW (ref 3.5–5.0)
Anion gap: 10 (ref 5–15)
BILIRUBIN TOTAL: 0.4 mg/dL (ref 0.3–1.2)
BUN: 23 mg/dL — AB (ref 6–20)
CALCIUM: 8.9 mg/dL (ref 8.9–10.3)
CO2: 31 mmol/L (ref 22–32)
CREATININE: 0.67 mg/dL (ref 0.44–1.00)
Chloride: 109 mmol/L (ref 101–111)
Glucose, Bld: 166 mg/dL — ABNORMAL HIGH (ref 65–99)
Potassium: 3 mmol/L — ABNORMAL LOW (ref 3.5–5.1)
Sodium: 150 mmol/L — ABNORMAL HIGH (ref 135–145)
Total Protein: 5.7 g/dL — ABNORMAL LOW (ref 6.5–8.1)

## 2016-12-20 LAB — CBC
HEMATOCRIT: 33.3 % — AB (ref 36.0–46.0)
HEMOGLOBIN: 10.2 g/dL — AB (ref 12.0–15.0)
MCH: 28.7 pg (ref 26.0–34.0)
MCHC: 30.6 g/dL (ref 30.0–36.0)
MCV: 93.5 fL (ref 78.0–100.0)
Platelets: 402 10*3/uL — ABNORMAL HIGH (ref 150–400)
RBC: 3.56 MIL/uL — AB (ref 3.87–5.11)
RDW: 16.2 % — ABNORMAL HIGH (ref 11.5–15.5)
WBC: 19.7 10*3/uL — ABNORMAL HIGH (ref 4.0–10.5)

## 2016-12-20 MED ORDER — POTASSIUM CHLORIDE 20 MEQ/15ML (10%) PO SOLN
40.0000 meq | Freq: Once | ORAL | Status: AC
  Administered 2016-12-20: 40 meq
  Filled 2016-12-20: qty 30

## 2016-12-20 MED ORDER — IBUPROFEN 100 MG/5ML PO SUSP
400.0000 mg | Freq: Four times a day (QID) | ORAL | Status: DC | PRN
  Administered 2016-12-20: 400 mg via ORAL
  Filled 2016-12-20 (×2): qty 20

## 2016-12-20 MED ORDER — DEXTROSE 5 % IV SOLN
INTRAVENOUS | Status: DC
  Administered 2016-12-20: 16:00:00 via INTRAVENOUS
  Administered 2016-12-21: 75 mL via INTRAVENOUS
  Administered 2016-12-21 – 2016-12-22 (×3): via INTRAVENOUS

## 2016-12-20 MED ORDER — WHITE PETROLATUM GEL
Status: DC | PRN
  Administered 2016-12-20 – 2016-12-22 (×2): via TOPICAL
  Filled 2016-12-20: qty 1
  Filled 2016-12-20: qty 28.35
  Filled 2016-12-20: qty 1

## 2016-12-20 MED ORDER — FENTANYL CITRATE (PF) 100 MCG/2ML IJ SOLN
25.0000 ug | Freq: Once | INTRAMUSCULAR | Status: AC
Start: 2016-12-20 — End: 2016-12-20
  Administered 2016-12-20: 25 ug via INTRAVENOUS
  Filled 2016-12-20: qty 2

## 2016-12-20 NOTE — Progress Notes (Signed)
eLink Physician-Brief Progress Note Patient Name: [name] DOB: 1966/08/01 MRN: [id number]   Date of Service  12/20/2016  HPI/Events of Note  Multiple issues: 1. Patient is NPO and on several PO medications, 2. Patient c/o total body pain and 3. BP = 193/115 after Hydralazine IV. Will see what Rx of pain does for BP prior to escalating therapy.   eICU Interventions  Will order: 1. Place NGT. 2. Motrin suspension 400 mg via tube Q 6 hours PRN.  3. Fentanyl 25 mcg IV X 1.         Aniruddh Ciavarella Cornelia Copa 12/20/2016, 8:42 PM

## 2016-12-20 NOTE — Evaluation (Signed)
Clinical/Bedside Swallow Evaluation Patient Details  Name: [name] MRN: [id number] Date of Birth: 1966-01-31  Today's Date: 12/20/2016 Time: SLP Start Time (ACUTE ONLY): 1640 SLP Stop Time (ACUTE ONLY): 1653 SLP Time Calculation (min) (ACUTE ONLY): 13 min  Past Medical History:  Past Medical History:  Diagnosis Date  . Allergic rhinitis   . Arthritis   . Asthma   . Back pain   . Bipolar 1 disorder ([place])   . Chronic low back pain 03/06/2014  . COPD (chronic obstructive pulmonary disease) ([place])   . Depression   . Fibroid   . Hypertension   . IBS (irritable bowel syndrome)   . Migraine   . Scoliosis    Past Surgical History:  Past Surgical History:  Procedure Laterality Date  . CHOLECYSTECTOMY    . DILATION AND CURETTAGE OF UTERUS    . EXPLORATORY LAPAROTOMY     WHEN PATIENT WAS YOUNG  . RIGHT FOOT SURGERY    . TUBAL LIGATION Bilateral    HPI:  51 y/o F, 45 pk year smoker, with Hx of COPD/asthma and bipolar disorder admitted 1/19 with SOB. Working diagnosis of acute asthma exacerbation, multilobar PNA with resultant hypoxic respiratory failure. Intubated 12/12/16 to 12/20/16.    Assessment / Plan / Recommendation Clinical Impression  Patient presents with suspected acute, reversible oropharyngeal dysphagia secondary to generalized weakness and prolonged intubation. Patient with fluctating alertness and cognitive status, required verbal cues to maintain alertness during the evaluation. Following oral care, presented pt with trials of ice chips x3 via spoon, thin water via teaspoon x1. Patient presents with weak, delayed, congested cough. Given length of intubation, decreased respiratory status and lethargy, recommend continuing NPO status at this time, SLP will f/u to determine readiness for PO trials, instrumental assessment, and for diet initiation when appropriate.    Aspiration Risk  Severe aspiration risk    Diet Recommendation NPO   Medication Administration: Via  alternative means    Other  Recommendations Oral Care Recommendations: Oral care QID Other Recommendations: Have oral suction available   Follow up Recommendations Other (comment) (TBD)      Frequency and Duration min 2x/week  2 weeks       Prognosis Prognosis for Safe Diet Advancement: Good      Swallow Study   General Date of Onset: 12/12/16 HPI: 51 y/o F, 32 pk year smoker, with Hx of COPD/asthma and bipolar disorder admitted 1/19 with SOB. Working diagnosis of acute asthma exacerbation, multilobar PNA with resultant hypoxic respiratory failure. Intubated 12/12/16 to 12/20/16.  Type of Study: Bedside Swallow Evaluation Previous Swallow Assessment: none per chart Diet Prior to this Study: NPO Temperature Spikes Noted: Yes (102.5) Respiratory Status: Nasal cannula History of Recent Intubation: Yes Length of Intubations (days): 8 days Date extubated: 12/20/16 Behavior/Cognition: Cooperative;Requires cueing;Lethargic/Drowsy Oral Cavity Assessment: Dry Oral Care Completed by SLP: Yes Oral Cavity - Dentition: Missing dentition Vision: Functional for self-feeding Self-Feeding Abilities: Needs assist Patient Positioning: Upright in bed Baseline Vocal Quality: Aphonic Volitional Cough: Weak;Congested Volitional Swallow: Able to elicit    Oral/Motor/Sensory Function Overall Oral Motor/Sensory Function: Generalized oral weakness Facial ROM: Within Functional Limits Facial Symmetry: Within Functional Limits Facial Strength: Within Functional Limits Facial Sensation: Within Functional Limits Lingual ROM: Within Functional Limits Lingual Symmetry: Within Functional Limits Lingual Strength: Reduced Lingual Sensation: Within Functional Limits Velum: Within Functional Limits Mandible: Within Functional Limits   Ice Chips Ice chips: Impaired Presentation: Spoon Pharyngeal Phase Impairments: Cough - Delayed   Thin Liquid Thin  Liquid: Impaired Presentation: Spoon Pharyngeal   Phase Impairments: Cough - Delayed    Nectar Thick Nectar Thick Liquid: Not tested   Honey Thick Honey Thick Liquid: Not tested   Puree Puree: Not tested   Solid   GO   Solid: Not tested       Deneise Lever, MS CF-SLP Speech-Language Pathologist  [place] 12/20/2016,5:32 PM

## 2016-12-20 NOTE — Procedures (Signed)
Extubation Procedure Note  Patient Details:   Name: [name] DOB: November 05, 1966 MRN: [id number]   Airway Documentation:  Airway 7.5 mm (Active)  Secured at (cm) 22 cm 12/20/2016 11:49 AM  Measured From Lips 12/20/2016 11:49 AM  [place] 12/20/2016 11:49 AM  Secured By [employer] 12/20/2016 11:49 AM  Tube Holder Repositioned Yes 12/20/2016 11:49 AM  Cuff Pressure (cm H2O) 24 cm H2O 12/20/2016  8:25 AM  Site Condition Dry 12/20/2016 11:49 AM  Pt positive for cuff leak. Placed on 4lpm  no distress noted at this time. Incentive spirometer performed x10 breaths achieved 750cc's.   Evaluation  O2 sats: stable throughout Complications: No apparent complications Patient did tolerate procedure well. Bilateral Breath Sounds: Clear   Yes  [name] Wyatt Haste 12/20/2016, 12:05 PM

## 2016-12-20 NOTE — Progress Notes (Signed)
PULMONARY / CRITICAL CARE MEDICINE   Name: [name] MRN: [id number] DOB: February 22, 1966    ADMISSION DATE:  12/12/2016 CONSULTATION DATE:  12/12/2016  REFERRING MD:  Alvino Chapel, MD (EDP)  BRIEF SUMMARY:  51 y/o F, 52 pk year smoker, with Hx of COPD/asthma and bipolar disorder admitted 1/19 with SOB.  Working diagnosis of acute asthma exacerbation, multilobar PNA with resultant hypoxic respiratory failure.  Intubated.   SUBJECTIVE:  RN reports fever to 102 overnight, no wean attempt yet this am.  Pt awake/alert.       VITAL SIGNS: BP 116/60   Pulse 86   Temp 99.7 F (37.6 C) (Oral)   Resp (!) 24   Ht 5\' 4"  (1.626 m)   Wt 159 lb 2.8 oz (72.2 kg)   SpO2 96%   BMI 27.32 kg/m   VENTILATOR SETTINGS: Vent Mode: SIMV;PRVC FiO2 (%):  [40 %] 40 % Set Rate:  [8 bmp-16 bmp] 8 bmp Vt Set:  [420 mL] 420 mL PEEP:  [5 cmH20] 5 cmH20 Pressure Support:  [10 cmH20] 10 cmH20 Plateau Pressure:  [13 cmH20-16 cmH20] 13 cmH20  INTAKE / OUTPUT: I/O last 3 completed shifts: In: 3039 [I.V.:389; NG/GT:2100; IV Piggyback:550] Out: [id number] [Urine:2865; Stool:645]  PHYSICAL EXAMINATION: General: adult female in NAD on vent  HEENT: MM pink/moist, ETT in place Neuro: Awake, follows commands appropriately  CV: s1s2 rrr, no m/r/g PULM: non-labored, lungs bilaterally clear, pulling 700 to 1L Vt on PSV [id number], non-tender, bsx4 active  Extremities: warm/dry, no edema  Skin: no rashes or lesions   LABS:  BMET  Recent Labs Lab 12/19/16 0207 12/19/16 1915 12/20/16 0314  NA 150* 149* 150*  K 3.7 4.0 3.0*  CL 108 109 109  CO2 31 29 31   BUN 21* 26* 23*  CREATININE 0.53 0.60 0.67  GLUCOSE 177* 155* 166*    Electrolytes  Recent Labs Lab 12/17/16 0252 12/18/16 0336 12/19/16 0207 12/19/16 1915 12/20/16 0314  CALCIUM 8.7* 8.8* 8.8* 9.1 8.9  MG 1.5* 2.1 2.1  --   --   PHOS 4.6 3.4 2.9  --   --     CBC  Recent Labs Lab 12/17/16 0252 12/19/16 0815 12/20/16 0314  WBC 14.1* 19.2*  19.7*  HGB 10.4* 10.8* 10.2*  HCT 33.0* 35.7* 33.3*  PLT 281 355 402*    Coag's  Recent Labs Lab 12/14/16 0021  INR 1.21    Sepsis Markers  Recent Labs Lab 12/14/16 0021 12/15/16 0506  PROCALCITON 1.05 0.63    ABG No results for input(s): PHART, PCO2ART, PO2ART in the last 168 hours.  Liver Enzymes  Recent Labs Lab 12/15/16 0820 12/16/16 0210 12/20/16 0314  AST 116* 111* 30  ALT 497* 472* 123*  ALKPHOS 112 112 81  BILITOT 0.3 0.5 0.4  ALBUMIN 2.6* 2.8* 2.6*    Cardiac Enzymes No results for input(s): TROPONINI, PROBNP in the last 168 hours.  Glucose  Recent Labs Lab 12/19/16 1236 12/19/16 1531 12/19/16 2049 12/20/16 0022 12/20/16 0454 12/20/16 0855  GLUCAP 119* 165* 163* 131* 155* 152*    Imaging Dg Chest Port 1 View  Result Date: 12/20/2016 CLINICAL DATA:  Ventilator EXAM: PORTABLE CHEST 1 VIEW COMPARISON:  12/19/2016 FINDINGS: Endotracheal tube and NG tube are unchanged. Cavitary lesion in the left mid lung again noted, stable. Heart is normal size. Right lung is clear. No visible effusions or acute bony abnormality. IMPRESSION: Stable cavitary lesion in the left mid lung. Endotracheal tube stable. Electronically Signed   By: Lennette Bihari  [place] M.D.   On: 12/20/2016 07:14     STUDIES:  EKG 1/19>> Sinus tachycardia, T wave inversion in inferior leads which are new compared to previous EKG, QTc 620 CXR 1/19>> Consolidations bilaterally CT head 1/19>> Left occipital lobe probable acute or early subacute infarction. No acute intracranial hemorrhage. 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 There is a small amount of fluid in this structure. Also a 1 cm nodular structure in the left lower lobe with minimal cavitation.   CULTURES: Viral resp  panel>> neg Resp Cx>> Abundant strep pneumo Blood Cx 1/19>> NGTD Blood Cx 1/24>> NGTD  ANTIBIOTICS/VIRALS: Ceftriaxone 1/19 >> 1/24 Azithromycin 1/19 >> 1/22 Acyclovir 1/22 >>  Unasyn 1/24 >>  SIGNIFICANT EVENTS: 1/19  Intubated, admitted 1/27  Extubated  LINES/TUBES: ETT 1/19 >> 1/27 Foley 1/19 >> OG 1/19 >>  DISCUSSION: 51 y/o F, 45 pk year smoker, with Hx of COPD/asthma and bipolar disorder admitted 1/19 with SOB.  Working diagnosis of acute asthma exacerbation, multilobar PNA with resultant hypoxic respiratory failure.  Intubated.  Extubated 1/27.    ASSESSMENT / PLAN:  PULMONARY A: Acute asthma exacerbation - resolving Acute hypoxic respiratory failure secondary to Multilobar PNA COPD Smoker Allergic rhinitis Multilobar pneumonia ?lung abscess P:   Meets extubation criteria 1/27, proceed with extubation to [place] Continue duonebs Q6  See ID  Prednisone taper in place Intermittent CXR  Completed decadron   CARDIOVASCULAR A:  HTN Abnormal EKG Demand ischemia, resolving Grade 2 diastolic dysfunction P:  ICU monitoring  Continue lopressor 25 mg BID PRN hydralazine  RENAL A:   AKI - resolved Hypokalemia Hypernatremia P:   Trend BMP / UOP  KCL 40 mEq PT prior to extubation  GASTROINTESTINAL A:   Ischemic hepatitis >> AST 2024, ALT 1366, trending down, acute hepatitis panel neg P:   Pepcid for SUP D/C NGT with extubation  SLP eval for swallowing post extubation   HEMATOLOGIC A:   Leukocytosis >> improving Acute Anemia >> Hgb 10.4 on admission. Baseline around 12.  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  Patient updated on plan of care 1/27.   - Inter-disciplinary family meet or Palliative Care meeting due by:  1/27   CC Time: 30 minutes  Noe Gens, NP-C [facility] Pgr: [phone] or if no answer [phone] 12/20/2016, 11:47 AM

## 2016-12-21 ENCOUNTER — Inpatient Hospital Stay (HOSPITAL_COMMUNITY): Payer: Medicaid Other

## 2016-12-21 DIAGNOSIS — J9601 Acute respiratory failure with hypoxia: Secondary | ICD-10-CM

## 2016-12-21 LAB — BASIC METABOLIC PANEL
ANION GAP: 10 (ref 5–15)
Anion gap: 13 (ref 5–15)
BUN: 15 mg/dL (ref 6–20)
BUN: 14 mg/dL (ref 6–20)
CALCIUM: 9.2 mg/dL (ref 8.9–10.3)
CHLORIDE: 111 mmol/L (ref 101–111)
CO2: 27 mmol/L (ref 22–32)
CO2: 25 mmol/L (ref 22–32)
CREATININE: 0.54 mg/dL (ref 0.44–1.00)
Calcium: 8.9 mg/dL (ref 8.9–10.3)
Chloride: 111 mmol/L (ref 101–111)
Creatinine, Ser: 0.47 mg/dL (ref 0.44–1.00)
GFR calc non Af Amer: >60 mL/min (ref >60)
Glucose, Bld: 108 mg/dL — ABNORMAL HIGH (ref 65–99)
Glucose, Bld: 120 mg/dL — ABNORMAL HIGH (ref 65–99)
Potassium: 2.6 mmol/L — CL (ref 3.5–5.1)
Potassium: 3.9 mmol/L (ref 3.5–5.1)
SODIUM: 148 mmol/L — AB (ref 135–145)
SODIUM: 149 mmol/L — AB (ref 135–145)

## 2016-12-21 LAB — C DIFFICILE QUICK SCREEN W PCR REFLEX
C Diff antigen: POSITIVE — AB
C Diff toxin: NEGATIVE

## 2016-12-21 LAB — CBC WITH DIFFERENTIAL/PLATELET
BASOS ABS: 0 10*3/uL (ref 0.0–0.1)
Basophils Relative: 0 %
EOS ABS: 0 10*3/uL (ref 0.0–0.7)
Eosinophils Relative: 0 %
HEMATOCRIT: 35.5 % — AB (ref 36.0–46.0)
HEMOGLOBIN: 10.9 g/dL — AB (ref 12.0–15.0)
Lymphocytes Relative: 8 %
Lymphs Abs: 1.7 10*3/uL (ref 0.7–4.0)
MCH: 28.8 pg (ref 26.0–34.0)
MCHC: 30.7 g/dL (ref 30.0–36.0)
MCV: 93.7 fL (ref 78.0–100.0)
Monocytes Absolute: 1.8 10*3/uL — ABNORMAL HIGH (ref 0.1–1.0)
Monocytes Relative: 8 %
NEUTROS ABS: 18.6 10*3/uL — AB (ref 1.7–7.7)
NEUTROS PCT: 84 %
Platelets: 483 10*3/uL — ABNORMAL HIGH (ref 150–400)
RBC: 3.79 MIL/uL — AB (ref 3.87–5.11)
RDW: 16.3 % — ABNORMAL HIGH (ref 11.5–15.5)
WBC: 22.1 10*3/uL — AB (ref 4.0–10.5)

## 2016-12-21 LAB — GLUCOSE, CAPILLARY
GLUCOSE-CAPILLARY: 122 mg/dL — AB (ref 65–99)
GLUCOSE-CAPILLARY: 125 mg/dL — AB (ref 65–99)
GLUCOSE-CAPILLARY: 127 mg/dL — AB (ref 65–99)
GLUCOSE-CAPILLARY: 130 mg/dL — AB (ref 65–99)
Glucose-Capillary: 114 mg/dL — ABNORMAL HIGH (ref 65–99)
Glucose-Capillary: 115 mg/dL — ABNORMAL HIGH (ref 65–99)
Glucose-Capillary: 134 mg/dL — ABNORMAL HIGH (ref 65–99)

## 2016-12-21 LAB — MAGNESIUM: Magnesium: 2.1 mg/dL (ref 1.7–2.4)

## 2016-12-21 LAB — PHOSPHORUS: Phosphorus: 2.9 mg/dL (ref 2.5–4.6)

## 2016-12-21 MED ORDER — BUDESONIDE 0.25 MG/2ML IN SUSP
0.2500 mg | Freq: Two times a day (BID) | RESPIRATORY_TRACT | Status: DC
  Administered 2016-12-21 – 2016-12-23 (×5): 0.25 mg via RESPIRATORY_TRACT
  Filled 2016-12-21 (×6): qty 2

## 2016-12-21 MED ORDER — MORPHINE SULFATE (PF) 2 MG/ML IV SOLN
1.0000 mg | INTRAVENOUS | Status: DC | PRN
  Administered 2016-12-22: 2 mg via INTRAVENOUS
  Filled 2016-12-21: qty 1

## 2016-12-21 MED ORDER — FENTANYL CITRATE (PF) 100 MCG/2ML IJ SOLN
50.0000 ug | Freq: Once | INTRAMUSCULAR | Status: AC
  Administered 2016-12-21: 50 ug via INTRAVENOUS
  Filled 2016-12-21: qty 2

## 2016-12-21 MED ORDER — IPRATROPIUM-ALBUTEROL 0.5-2.5 (3) MG/3ML IN SOLN
3.0000 mL | Freq: Three times a day (TID) | RESPIRATORY_TRACT | Status: DC
  Administered 2016-12-22 (×3): 3 mL via RESPIRATORY_TRACT
  Filled 2016-12-21 (×3): qty 3

## 2016-12-21 MED ORDER — POTASSIUM CHLORIDE 20 MEQ/15ML (10%) PO SOLN
40.0000 meq | ORAL | Status: AC
  Administered 2016-12-21 (×3): 40 meq
  Filled 2016-12-21 (×3): qty 30

## 2016-12-21 MED ORDER — STROKE: EARLY STAGES OF RECOVERY BOOK
Freq: Once | Status: DC
Start: 2016-12-21 — End: 2016-12-24
  Filled 2016-12-21: qty 1

## 2016-12-21 MED ORDER — LIDOCAINE VISCOUS 2 % MT SOLN
15.0000 mL | OROMUCOSAL | Status: DC | PRN
  Administered 2016-12-21: 15 mL via OROMUCOSAL
  Filled 2016-12-21 (×3): qty 15

## 2016-12-21 NOTE — Progress Notes (Signed)
PULMONARY / CRITICAL CARE MEDICINE   Name: [name] MRN: [id number] DOB: 06-18-1966    ADMISSION DATE:  12/12/2016 CONSULTATION DATE:  12/12/2016  REFERRING MD:  Alvino Chapel, MD (EDP)  BRIEF SUMMARY:  51 y/o F, 26 pk year smoker, with Hx of COPD/asthma and bipolar disorder admitted 1/19 with SOB.  Working diagnosis of acute asthma exacerbation, multilobar PNA with resultant hypoxic respiratory failure.  Intubated.   SUBJECTIVE:  Extubated yesterday. Feels better overall. Breathing better. No other complaints.   VITAL SIGNS: BP (!) 174/109   Pulse (!) 111   Temp 98.2 F (36.8 C) (Oral)   Resp (!) 21   Ht 5\' 4"  (1.626 m)   Wt 162 lb 14.7 oz (73.9 kg)   SpO2 93%   BMI 27.97 kg/m   INTAKE / OUTPUT: I/O last 3 completed shifts: In: 3054.8 [I.V.:1154.8; NG/GT:1300; IV Piggyback:600] Out: 2085 [Urine:1760; Stool:325]  PHYSICAL EXAMINATION: General:  NAD HEENT: MM pink/moist Neuro: Awake, follows commands appropriately  CV: s1s2 rrr, no m/r/g PULM: non-labored, lungs bilaterally clear [id number], non-tender, bsx4 active  Extremities: warm/dry, no edema  Skin: no rashes or lesions   LABS:  BMET  Recent Labs Lab 12/19/16 1915 12/20/16 0314 12/21/16 0227  NA 149* 150* 148*  K 4.0 3.0* 2.6*  CL 109 109 111  CO2 29 31 27   BUN 26* 23* 15  CREATININE 0.60 0.67 0.47  GLUCOSE 155* 166* 108*    Electrolytes  Recent Labs Lab 12/17/16 0252 12/18/16 0336 12/19/16 0207 12/19/16 1915 12/20/16 0314 12/21/16 0227  CALCIUM 8.7* 8.8* 8.8* 9.1 8.9 8.9  MG 1.5* 2.1 2.1  --   --   --   PHOS 4.6 3.4 2.9  --   --   --     CBC  Recent Labs Lab 12/19/16 0815 12/20/16 0314 12/21/16 0227  WBC 19.2* 19.7* 22.1*  HGB 10.8* 10.2* 10.9*  HCT 35.7* 33.3* 35.5*  PLT 355 402* 483*    Coag's No results for input(s): APTT, INR in the last 168 hours.  Sepsis Markers  Recent Labs Lab 12/15/16 0506  PROCALCITON 0.63    ABG No results for input(s): PHART, PCO2ART,  PO2ART in the last 168 hours.  Liver Enzymes  Recent Labs Lab 12/15/16 0820 12/16/16 0210 12/20/16 0314  AST 116* 111* 30  ALT 497* 472* 123*  ALKPHOS 112 112 81  BILITOT 0.3 0.5 0.4  ALBUMIN 2.6* 2.8* 2.6*    Cardiac Enzymes No results for input(s): TROPONINI, PROBNP in the last 168 hours.  Glucose  Recent Labs Lab 12/20/16 0855 12/20/16 1236 12/20/16 1641 12/20/16 1924 12/21/16 0044 12/21/16 0341  GLUCAP 152* 156* 121* 106* 125* 122*    Imaging Dg Abd Portable 1v  Result Date: 12/20/2016 CLINICAL DATA:  Initial evaluation for NG tube placement. EXAM: PORTABLE ABDOMEN - 1 VIEW COMPARISON:  Prior radiograph from 12/13/2016. FINDINGS: NG tube in place with tip and side hole overlying the gastric body. Side hole well beyond the GE junction. Tip projects inferiorly. Multiple dilated gas-filled loops of bowel seen within the abdomen measuring up to 7.3 cm. Visualized lung bases grossly clear. IMPRESSION: 1. Tip and side hole of NG tube overlying the gastric body. Side hole beyond the GE junction. Tip projects inferiorly. 2. Multiple gas-filled loops of bowel seen throughout the partially visualized abdomen. Findings may reflect sequelae of ileus or possibly distal obstructive process. Electronically Signed   By: Jeannine Boga M.D.   On: 12/20/2016 22:02     STUDIES:  EKG 1/19>> Sinus tachycardia, T wave inversion in inferior leads which are new compared to previous EKG, QTc 620 CXR 1/19>> Consolidations bilaterally CT head 1/19>> Left occipital lobe probable acute or early subacute infarction. No acute intracranial hemorrhage. 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 There is a small amount of fluid in this structure. Also a 1 cm nodular structure in the left  lower lobe with minimal cavitation.  Abd xr 1/27>> bowel distended with gas  CULTURES: Viral resp panel>> neg Resp Cx>> Abundant strep pneumo Blood Cx 1/19>> NGTD Blood Cx 1/24>> NGTD C diff 1/28>>  ANTIBIOTICS/VIRALS: Ceftriaxone 1/19 >> 1/24 Azithromycin 1/19 >> 1/22 Acyclovir 1/22 >>  Unasyn 1/24 >>  SIGNIFICANT EVENTS: 1/19  Intubated, admitted 1/27  Extubated  LINES/TUBES: ETT 1/19 >> 1/27 Foley 1/19 >> OG 1/19 >>  DISCUSSION: 51 y/o F, 45 pk year smoker, with Hx of COPD/asthma and bipolar disorder admitted 1/19 with SOB.  Working diagnosis of acute asthma exacerbation, multilobar PNA with resultant hypoxic respiratory failure.  Intubated.  Extubated 1/27.    ASSESSMENT / PLAN:  PULMONARY A: Acute asthma exacerbation - resolving Acute hypoxic respiratory failure secondary to Multilobar PNA COPD Smoker Allergic rhinitis Multilobar pneumonia ?lung abscess P:   Supplemental O2 prn to maintain O2 sat >90% Continue duonebs Q6  See ID  Prednisone taper in place Intermittent CXR   CARDIOVASCULAR A:  HTN Abnormal EKG Demand ischemia, resolving Grade 2 diastolic dysfunction P:  Continue lopressor 25 mg BID PRN hydralazine  RENAL A:   AKI - resolved Hypokalemia Hypernatremia P:   Trend BMP / UOP  Replete K Free water  GASTROINTESTINAL A:   Ischemic hepatitis >> AST 2024, ALT 1366, trending down, acute hepatitis panel neg P:   Pepcid for SUP NPO, SLP reeval pending NG tube in place  HEMATOLOGIC A:   Leukocytosis >> improving Acute Anemia >> Hgb 10.4 on admission. Baseline around 12.  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  Patient updated on plan of care 1/27.   - Inter-disciplinary family meet or Palliative Care meeting due by:  1/27  Jacques Earthly, MD  Internal Medicine PGY-3 Pager: [phone] If no answer [phone] 12/21/2016, 7:25 AM

## 2016-12-21 NOTE — Progress Notes (Signed)
Speech Language Pathology Treatment: Dysphagia  Patient Details Name: [name] MRN: [id number] DOB: May 17, 1966 Today's Date: 12/21/2016 Time: [id number] SLP Time Calculation (min) (ACUTE ONLY): 14 min  Assessment / Plan / Recommendation Clinical Impression  Patient presents with improved alertness and sustained attention to PO trials. Oriented x4, follows commands. Vocal quality has improved, however patient remains hypophonic, hoarse. Tolerated ice chips with no overt signs of aspiration. Presented with delayed throat clearing and cough following trial of thin liquid via teaspoon. NG tube was placed last night. Recommend instrumental assessment to rule out silent aspiration given length of intubation, acute left cerebellar infarctions, and respiratory status. Patient may have ice chips for comfort PRN after oral care, medications via alternative means. MBS has been ordered for 12/22/16.    HPI HPI: 51 y/o F, 60 pk year smoker, with Hx of COPD/asthma and bipolar disorder admitted 1/19 with SOB. Working diagnosis of acute asthma exacerbation, multilobar PNA with resultant hypoxic respiratory failure. Intubated 12/12/16 to 12/20/16.       SLP Plan  MBS;Continue with current plan of care;New goals to be determined pending instrumental study     Recommendations  Diet recommendations: NPO;Other(comment) (ice chips after oral care) Medication Administration: Via alternative means Supervision: Staff to assist with self feeding Compensations: Slow rate;Small sips/bites Postural Changes and/or Swallow Maneuvers: Seated upright 90 degrees                Oral Care Recommendations: Oral care QID Follow up Recommendations: Other (comment) Plan: MBS;Continue with current plan of care;New goals to be determined pending instrumental study    Deneise Lever, [place] CF-SLP Speech-Language Pathologist [place] 12/21/2016, 1:17 PM

## 2016-12-21 NOTE — Evaluation (Signed)
Physical Therapy Evaluation Patient Details Name: [name] MRN: [id number] DOB: [birth year] Today's Date: 12/21/2016   History of Present Illness  51 y/o F, 20 pk year smoker, with Hx of COPD/asthma and bipolar disorder admitted 1/19 with SOB.  Working diagnosis of acute asthma exacerbation, multilobar PNA with resultant hypoxic respiratory failure.  Intubated 12/12/16 to 12/20/16.  Extubated 1/28  Clinical Impression  Patient demonstrates deficits in functional mobility as indicated below. Will need continued skilled PT to address deficits and maximize function. Will see as indicated and progress as tolerated. At this time, patient extremely lethargic during session (? Medication related). Limited evaluation. Anticipate patient will need post acute therapies. Recommending ST SNF at this time.    Follow Up Recommendations SNF (limited evaluation)    Equipment Recommendations   (tbd)    Recommendations for Other Services       Precautions / Restrictions Precautions Precautions: Fall Precaution Comments: Ng tube Restrictions Weight Bearing Restrictions: No      Mobility  Bed Mobility Overal bed mobility: Needs Assistance Bed Mobility: Rolling;Sidelying to Sit;Sit to Sidelying Rolling: Mod assist Sidelying to sit: Max assist;+2 for physical assistance     Sit to sidelying: Mod assist;+2 for physical assistance General bed mobility comments: patient intermittently participating but overall lethargic and having difficulty carrying out tasks  Transfers                 General transfer comment: patient did not want to perform standing this session, states her stomach was bothering her and she did not want to  Ambulation/Gait                Stairs            Wheelchair Mobility    Modified Rankin (Stroke Patients Only)       Balance Overall balance assessment: Needs assistance Sitting-balance support: Feet supported Sitting balance-Leahy Scale:  Poor Sitting balance - Comments: min guard to min assist for static sitting balance                                     Pertinent Vitals/Pain Pain Assessment: Faces Faces Pain Scale: Hurts even more Pain Location: stomach Pain Descriptors / Indicators: Discomfort Pain Intervention(s): Limited activity within patient's tolerance;Monitored during session    Home Living Family/patient expects to be discharged to:: Private residence Living Arrangements: Alone Available Help at Discharge: Friend(s);Available PRN/intermittently Type of Home: House Home Access: Stairs to enter   Entrance Stairs-Number of Steps: 3 Home Layout: One level Home Equipment: None Additional Comments: home set up obtained via chart review from previous admissions    Prior Function Level of Independence: Independent               Hand Dominance   Dominant Hand: Right    Extremity/Trunk Assessment   Upper Extremity Assessment Upper Extremity Assessment: Generalized weakness    Lower Extremity Assessment Lower Extremity Assessment: RLE deficits/detail;Generalized weakness (limited gross motions during testing in bed and EOB) RLE Deficits / Details: some modest deficits in RLE compared to right, overall generalized weakness with difficulty performing throughout assessment due to impaired cognitive status and arousal       Communication   Communication: No difficulties  Cognition Arousal/Alertness: Lethargic Behavior During Therapy: Flat affect Overall Cognitive Status: Difficult to assess  General Comments      Exercises     Assessment/Plan    PT Assessment Patient needs continued PT services  PT Problem List Decreased strength;Decreased activity tolerance;Decreased balance;Decreased mobility;Decreased cognition;Decreased safety awareness;Pain          PT Treatment Interventions DME instruction;Gait training;Stair training;Functional  mobility training;Therapeutic activities;Therapeutic exercise;Balance training;Neuromuscular re-education;Cognitive remediation;Patient/family education    PT Goals (Current goals can be found in the Care Plan section)  Acute Rehab PT Goals Patient Stated Goal: none stated PT Goal Formulation: Patient unable to participate in goal setting Time For Goal Achievement: 01/04/17 Potential to Achieve Goals: Good    Frequency Min 3X/week   Barriers to discharge Decreased caregiver support      Co-evaluation               End of Session   Activity Tolerance: Patient limited by fatigue;Patient limited by lethargy;Patient limited by pain Patient left: in bed;with call bell/phone within reach;with bed alarm set Nurse Communication: Mobility status         Time: [id number] PT Time Calculation (min) (ACUTE ONLY): 20 min   Charges:   PT Evaluation $PT Eval Moderate Complexity: 1 Procedure     PT G Codes:        Duncan Dull 12/29/2016, 2:18 PM Alben Deeds, [place] DPT  [phone]

## 2016-12-21 NOTE — Progress Notes (Signed)
[facility] ADULT ICU REPLACEMENT PROTOCOL FOR AM LAB REPLACEMENT ONLY  The patient does apply for the [facility] Adult ICU Electrolyte Replacment Protocol based on the criteria listed below:   1. Is GFR >/= 40 ml/min? Yes.    Patient's GFR today is >60 2. Is urine output >/= 0.5 ml/kg/hr for the last 6 hours? Yes.   Patient's UOP is 0.85 ml/kg/hr 3. Is BUN < 60 mg/dL? Yes.    Patient's BUN today is 15 4. Abnormal electrolyte  K 2.6 5. Ordered repletion with: per protocol 6. If a panic level lab has been reported, has the CCM MD in charge been notified? Yes.  .   Physician:  E Deterding  Christeen Douglas 12/21/2016 4:23 AM

## 2016-12-22 ENCOUNTER — Inpatient Hospital Stay (HOSPITAL_COMMUNITY): Payer: Medicaid Other

## 2016-12-22 DIAGNOSIS — R740 Nonspecific elevation of levels of transaminase and lactic acid dehydrogenase [LDH]: Secondary | ICD-10-CM

## 2016-12-22 DIAGNOSIS — D649 Anemia, unspecified: Secondary | ICD-10-CM

## 2016-12-22 DIAGNOSIS — R197 Diarrhea, unspecified: Secondary | ICD-10-CM

## 2016-12-22 DIAGNOSIS — G9340 Encephalopathy, unspecified: Secondary | ICD-10-CM

## 2016-12-22 LAB — CBC
HCT: 35.3 % — ABNORMAL LOW (ref 36.0–46.0)
HCT: 35.3 % — ABNORMAL LOW (ref 36.0–46.0)
HEMOGLOBIN: 11.1 g/dL — AB (ref 12.0–15.0)
Hemoglobin: 11 g/dL — ABNORMAL LOW (ref 12.0–15.0)
MCH: 29.5 pg (ref 26.0–34.0)
MCH: 28.9 pg (ref 26.0–34.0)
MCHC: 31.4 g/dL (ref 30.0–36.0)
MCHC: 31.2 g/dL (ref 30.0–36.0)
MCV: 93.9 fL (ref 78.0–100.0)
MCV: 92.7 fL (ref 78.0–100.0)
PLATELETS: 453 10*3/uL — AB (ref 150–400)
PLATELETS: 532 10*3/uL — AB (ref 150–400)
RBC: 3.76 MIL/uL — AB (ref 3.87–5.11)
RBC: 3.81 MIL/uL — ABNORMAL LOW (ref 3.87–5.11)
RDW: 16.7 % — ABNORMAL HIGH (ref 11.5–15.5)
RDW: 16.3 % — AB (ref 11.5–15.5)
WBC: 18 10*3/uL — ABNORMAL HIGH (ref 4.0–10.5)
WBC: 24.3 10*3/uL — AB (ref 4.0–10.5)

## 2016-12-22 LAB — CULTURE, BLOOD (ROUTINE X 2)
Culture: NO GROWTH
Culture: NO GROWTH

## 2016-12-22 LAB — BASIC METABOLIC PANEL
ANION GAP: 11 (ref 5–15)
BUN: 15 mg/dL (ref 6–20)
CALCIUM: 9.1 mg/dL (ref 8.9–10.3)
CO2: 23 mmol/L (ref 22–32)
CREATININE: 0.6 mg/dL (ref 0.44–1.00)
Chloride: 113 mmol/L — ABNORMAL HIGH (ref 101–111)
Glucose, Bld: 100 mg/dL — ABNORMAL HIGH (ref 65–99)
Potassium: 5.1 mmol/L (ref 3.5–5.1)
Sodium: 147 mmol/L — ABNORMAL HIGH (ref 135–145)

## 2016-12-22 LAB — GLUCOSE, CAPILLARY
GLUCOSE-CAPILLARY: 125 mg/dL — AB (ref 65–99)
Glucose-Capillary: 109 mg/dL — ABNORMAL HIGH (ref 65–99)
Glucose-Capillary: 111 mg/dL — ABNORMAL HIGH (ref 65–99)
Glucose-Capillary: 135 mg/dL — ABNORMAL HIGH (ref 65–99)
Glucose-Capillary: 105 mg/dL — ABNORMAL HIGH (ref 65–99)

## 2016-12-22 LAB — MAGNESIUM: MAGNESIUM: 2.3 mg/dL (ref 1.7–2.4)

## 2016-12-22 LAB — BLOOD GAS, ARTERIAL
Acid-Base Excess: 1.7 mmol/L (ref 0.0–2.0)
BICARBONATE: 25.4 mmol/L (ref 20.0–28.0)
DRAWN BY: 398661
O2 Content: 4 L/min
O2 Saturation: 93.6 %
PATIENT TEMPERATURE: 98.6
PH ART: 7.447 (ref 7.350–7.450)
pCO2 arterial: 37.3 mmHg (ref 32.0–48.0)
pO2, Arterial: 71.7 mmHg — ABNORMAL LOW (ref 83.0–108.0)

## 2016-12-22 LAB — PHOSPHORUS: PHOSPHORUS: 3.3 mg/dL (ref 2.5–4.6)

## 2016-12-22 LAB — CLOSTRIDIUM DIFFICILE BY PCR: Toxigenic C. Difficile by PCR: POSITIVE — AB

## 2016-12-22 MED ORDER — IOPAMIDOL (ISOVUE-300) INJECTION 61%
15.0000 mL | INTRAVENOUS | Status: AC
Start: 2016-12-22 — End: 2016-12-22
  Administered 2016-12-22: 30 mL via ORAL
  Administered 2016-12-22: 15 mL via ORAL

## 2016-12-22 MED ORDER — VANCOMYCIN 50 MG/ML ORAL SOLUTION
125.0000 mg | Freq: Four times a day (QID) | ORAL | Status: DC
  Administered 2016-12-22: 125 mg
  Filled 2016-12-22 (×5): qty 2.5

## 2016-12-22 MED ORDER — VANCOMYCIN 50 MG/ML ORAL SOLUTION
125.0000 mg | Freq: Four times a day (QID) | ORAL | Status: DC
  Filled 2016-12-22: qty 2.5

## 2016-12-22 MED ORDER — KETOROLAC TROMETHAMINE 15 MG/ML IJ SOLN: 15.0000 mg | Freq: Three times a day (TID) | INTRAMUSCULAR | Status: AC | PRN

## 2016-12-22 MED ORDER — LAMOTRIGINE 100 MG PO TABS
100.0000 mg | ORAL_TABLET | Freq: Two times a day (BID) | ORAL | Status: DC
  Filled 2016-12-22: qty 1

## 2016-12-22 MED ORDER — OXYCODONE HCL 5 MG PO TABS: 15.0000 mg | ORAL_TABLET | Freq: Four times a day (QID) | ORAL | Status: DC | PRN

## 2016-12-22 MED ORDER — QUETIAPINE FUMARATE 50 MG PO TABS: 150.0000 mg | ORAL_TABLET | Freq: Two times a day (BID) | ORAL | Status: DC

## 2016-12-22 MED ORDER — PREDNISONE 5 MG/ML PO CONC
10.0000 mg | Freq: Every day | ORAL | Status: DC
  Filled 2016-12-22: qty 2

## 2016-12-22 MED ORDER — IOPAMIDOL (ISOVUE-300) INJECTION 61%
100.0000 mL | Freq: Once | INTRAVENOUS | Status: AC | PRN
  Administered 2016-12-22: 100 mL via INTRAVENOUS

## 2016-12-22 MED ORDER — METOPROLOL TARTRATE 5 MG/5ML IV SOLN
5.0000 mg | Freq: Four times a day (QID) | INTRAVENOUS | Status: DC
  Administered 2016-12-23 – 2016-12-26 (×15): 5 mg via INTRAVENOUS
  Filled 2016-12-22 (×16): qty 5

## 2016-12-22 MED ORDER — CLONAZEPAM 0.5 MG PO TBDP: 1.0000 mg | ORAL_TABLET | Freq: Two times a day (BID) | ORAL | Status: DC

## 2016-12-22 MED ORDER — METRONIDAZOLE IN NACL 5-0.79 MG/ML-% IV SOLN
500.0000 mg | Freq: Three times a day (TID) | INTRAVENOUS | Status: DC
  Administered 2016-12-23 – 2016-12-26 (×11): 500 mg via INTRAVENOUS
  Filled 2016-12-22 (×11): qty 100

## 2016-12-22 MED ORDER — METOPROLOL TARTRATE 25 MG PO TABS: 25.0000 mg | ORAL_TABLET | Freq: Two times a day (BID) | ORAL | Status: DC

## 2016-12-22 MED ORDER — IPRATROPIUM-ALBUTEROL 0.5-2.5 (3) MG/3ML IN SOLN
3.0000 mL | Freq: Four times a day (QID) | RESPIRATORY_TRACT | Status: DC
  Administered 2016-12-23 – 2016-12-26 (×12): 3 mL via RESPIRATORY_TRACT
  Filled 2016-12-22 (×14): qty 3

## 2016-12-22 MED ORDER — IOPAMIDOL (ISOVUE-300) INJECTION 61%
INTRAVENOUS | Status: AC
  Filled 2016-12-22: qty 100

## 2016-12-22 MED ORDER — FAMOTIDINE 20 MG PO TABS: 20.0000 mg | ORAL_TABLET | Freq: Two times a day (BID) | ORAL | Status: DC

## 2016-12-22 MED ORDER — OXYCODONE HCL 5 MG PO TABS
15.0000 mg | ORAL_TABLET | Freq: Three times a day (TID) | ORAL | Status: DC | PRN
  Administered 2016-12-22: 15 mg via ORAL
  Filled 2016-12-22: qty 3

## 2016-12-22 MED ORDER — ALBUTEROL SULFATE (2.5 MG/3ML) 0.083% IN NEBU
2.5000 mg | INHALATION_SOLUTION | RESPIRATORY_TRACT | Status: DC | PRN
  Filled 2016-12-22: qty 3

## 2016-12-22 MED ORDER — RESOURCE THICKENUP CLEAR PO POWD
ORAL | Status: DC | PRN
  Administered 2016-12-22: 11:00:00 via ORAL
  Filled 2016-12-22: qty 125

## 2016-12-22 NOTE — Progress Notes (Signed)
Modified Barium Swallow Progress Note  Patient Details  Name: [name] MRN: [id number] Date of Birth: 09-18-1966  Today's Date: 12/22/2016  Modified Barium Swallow completed.  Full report located under Chart Review in the Imaging Section.  Brief recommendations include the following:  Clinical Impression  Ms. Blanchett was cooperative but drowsy during the MBS and required moderate cueing to attend to the assessment and POs. Pt exhibits mild-moderate pharyngeal dysphagia characterized by delayed swallow initiation that results in silent penetration (able to clear with 2nd reflexive swallow) during nectar thick consistencies. Thicker textures were better contained in the valleculae before the swallow. Backflow from upper esophageal segment to pharynx also noted with nectar thick liquids. Due to prolonged intubation, fluctuating mentation/alertness, and hx of CVA, recommend diet of honey thick liquids, pureed solids, meds whole in puree, with full supervision during mealtimes, small bites/sips, seated upright. ST will f/u with pt for treatment to assess safety/efficiency of swallowing function and possible diet upgrade.    Swallow Evaluation Recommendations       SLP Diet Recommendations: Dysphagia 1 (Puree) solids;Honey thick liquids   Liquid Administration via: Cup;No straw   Medication Administration: Whole meds with puree   Supervision: Full supervision/cueing for compensatory strategies;Staff to assist with self feeding   Compensations: Slow rate;Small sips/bites   Postural Changes: Seated upright at 90 degrees;Remain semi-upright after after feeds/meals (Comment)   Oral Care Recommendations: Oral care BID   Other Recommendations: Order thickener from pharmacy;Prohibited food (jello, ice cream, thin soups);Remove water pitcher    [employer] , Student-SLP 12/22/2016,11:27 AM

## 2016-12-22 NOTE — Significant Event (Addendum)
Rapid Response Event Note   Overview: Time Called: 2054 Arrival Time: 2100 Event Type: Respiratory  Initial Focused Assessment:  Called by Sharion Balloon, RN for concerns  over pt's  Tachypnea, and abdominal pain.  Pt received Pulmicort .25 neb at 2038 and Morphine SO4 2 mg IV at 2041.  Upon arrival to room pt is lying in bed with HOB 30degrees. Opens eyes to name.  States " I feel like sh--, I'm dying"  She describes RLQ abd pain as 10/10, constant, throbbing and non radiating. Abd distended ,active bowel sounds throughout.Rectal pouch draining dark brown liquid stool.   Pt in mod respiratory distress with increased WOB, tachypnea RR 40 with mild retractions.  O2 sats 91% on 2l [place] .Bilateral breath sounds with EW bilateral upper fileds and diminished in bilateral bases.    On cardiac tele with ST 118, BP 160/98  113)    Interventions:  Stat PCXR:  No significant pneumothorax Placed on continuous pulse OX , Increased 02 to 3l [employer] with sats 96%, made NPO  2122 Spoke with Chaney Malling, NP re findings 2155  Chaney Malling , NP at bedside. Required increase to 4 l [place] , increased WOB now with belly breathing and retractions RR 42.  Spoke with daughter [name] at bedside who is very angry over mother's rapid decline and her not being notified of  events today. 43 Dr Quay Burow, CCM  Dr Ellwood Dense and Loistine Chance, CCM NP at bedside. Pt's breathing less labored after oxycodone 15 mg given at 2114 hrs. ABG:  2325 Spoke with daughter [name] regarding POC , need for NGT and and transfer to SDU       VS:  93% sats on 4l [place]  122 ST   34  98.3 oral temp 2330 #16 NGT inserted into right nare without difficulty.  Auscultation with air bolus positive.  Return of yellow bilious secretions noted in tube.  KUB ordered   0000Pt transferred to SDU 4E09.  Report given by Sharion Balloon, RN to Rachel Bo, RN 4E.  Daughter [name] informed of room location.  4E staff encouraged to keep Daughter informed of pt status and any significant  changes.     Plan of Care (if not transferred):  Event Summary: Name of Physician Notified: Chaney Malling, NP at 2122    at          [place]

## 2016-12-22 NOTE — Progress Notes (Addendum)
[facility] PROGRESS NOTE  [name] MRN:[id number] DOB: 05/02/1966 DOA: 12/12/2016  PCP: ARVIND,MOOGALI M, MD  Brief History/Interval Summary: 50 year old Caucasian female with a past medical history of COPD/asthma, bipolar disorder, was admitted to the hospital with shortness of breath. She was found to have pneumonia. She had to be intubated for worsening respiratory failure. She was admitted to the intensive care unit. Extubated and transferred to the floor. Noted to have loose stools. Noted have abdominal distention. During this hospitalization she was also found to have an acute stroke. Dysphagia has been an issue.  Reason for Visit: Pneumonia. Diarrhea  Consultants: Pulmonology  Procedures: None  Antibiotics: Unasyn  STUDIES:  EKG 1/19>> Sinus tachycardia, T wave inversion in inferior leads which are new compared to previous EKG, QTc 620 CXR 1/19>> Consolidations bilaterally CT head 1/19>> Left occipital lobe probable acute or early subacute infarction. No acute intracranial hemorrhage. 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 There is a small amount of fluid in this structure. Also a 1 cm nodular structure in the left lower lobe with minimal cavitation.  Abd xr 1/27>> bowel distended with gas  Transthoracic echocardiogram Study Conclusions  - Left ventricle: The cavity size was normal. Wall thickness was   normal. Systolic function was vigorous. The estimated ejection   fraction was in the range of 65% to 70%. Wall motion was normal;   there were no regional wall motion abnormalities. Features are   consistent with a pseudonormal left ventricular filling pattern,   with concomitant abnormal relaxation and increased filling   pressure (grade 2 diastolic  dysfunction). - Left atrium: The atrium was mildly dilated. - Right ventricle: The cavity size was mildly dilated. Wall   thickness was normal. - Pulmonary arteries: PA peak pressure: 44 mm Hg (S).  CULTURES: Viral resp panel>> neg Resp Cx>> Abundant strep pneumo Blood Cx 1/19>> NGTD Blood Cx 1/24>> NGTD C diff 1/28>> antigen positive. Toxin negative.  ANTIBIOTICS/VIRALS: Ceftriaxone 1/19 >> 1/24 Azithromycin 1/19 >> 1/22 Acyclovir 1/22 >>  Unasyn 1/24 >>  SIGNIFICANT EVENTS: 1/19  Intubated, admitted 1/27  Extubated  LINES/TUBES: ETT 1/19 >> 1/27 Foley 1/19 >> OG 1/19 >>   Subjective/Interval History: Patient somewhat confused and distracted. Asking for something to drink. Does complain of abdominal pain. Difficult to obtain history from her.  ROS: Unable to do at this time due to confusion  Objective:  Vital Signs  Vitals:   12/21/16 1925 12/21/16 2000 12/21/16 2148 12/22/16 0426  BP:  (!) 170/95 (!) 160/96 (!) 163/98  Pulse:  (!) 115 (!) 114 (!) 116  Resp:  (!) 23 (!) 22 20  Temp: 98 F (36.7 C)  98 F (36.7 C) 97.5 F (36.4 C)  TempSrc: Oral  Oral Oral  SpO2:  100% 98% 97%  Weight:      Height:        Intake/Output Summary (Last 24 hours) at 12/22/16 1246 Last data filed at 12/22/16 1144  Gross per 24 hour  Intake           1642.5 ml  Output              475 ml  Net           11 67.5 ml  Filed Weights   12/19/16 0500 12/20/16 0500 12/21/16 0352  Weight: 76.3 kg (168 lb 3.2 oz) 72.2 kg (159 lb 2.8 oz) 73.9 kg (162 lb 14.7 oz)    General appearance: Fatigued. Distracted and confused. In no distress. Head: Normocephalic, without obvious abnormality, atraumatic Resp: Diminished air entry at the bases. Coarse breath sounds. Crackles noted. No wheezing, no rhonchi. Cardio: regular rate and rhythm, S1, S2 normal, no murmur, click, rub or gallop GI: Abdomen is distended. Bowel sounds sluggish. Tender to palpation without any rebound, rigidity or  guarding. No masses or organomegaly. Extremities: Minimal edema bilateral lower extremities. Neurological: Confused, distracted. Moving all her extremities. No obvious facial asymmetry.  Lab Results:  Data Reviewed: I have personally reviewed following labs and imaging studies  CBC:  Recent Labs Lab 12/16/16 0210 12/17/16 0252 12/19/16 0815 12/20/16 0314 12/21/16 0227 12/22/16 0501  WBC 14.2* 14.1* 19.2* 19.7* 22.1* 18.0*  NEUTROABS 12.7* 12.6*  --   --  18.6*  --   HGB 10.3* 10.4* 10.8* 10.2* 10.9* 11.1*  HCT 31.9* 33.0* 35.7* 33.3* 35.5* 35.3*  MCV 91.9 91.9 94.7 93.5 93.7 93.9  PLT 278 281 355 402* 483* 453*    Basic Metabolic Panel:  Recent Labs Lab 12/17/16 0252 12/18/16 0336 12/19/16 0207 12/19/16 1915 12/20/16 0314 12/21/16 0227 12/21/16 0821 12/21/16 1947 12/22/16 0501  NA 144 149* 150* 149* 150* 148*  --  149* 147*  K 4.0 4.2 3.7 4.0 3.0* 2.6*  --  3.9 5.1  CL 104 105 108 109 109 111  --  111 113*  CO2 31 34* 31 29 31 27   --  25 23  GLUCOSE 172* 167* 177* 155* 166* 108*  --  120* 100*  BUN 25* 21* 21* 26* 23* 15  --  14 15  CREATININE 0.60 0.49 0.53 0.60 0.67 0.47  --  0.54 0.60  CALCIUM 8.7* 8.8* 8.8* 9.1 8.9 8.9  --  9.2 9.1  MG 1.5* 2.1 2.1  --   --   --  2.1  --  2.3  PHOS 4.6 3.4 2.9  --   --   --  2.9  --  3.3    GFR: Estimated Creatinine Clearance: 82 mL/min (by C-G formula based on SCr of 0.6 mg/dL).  Liver Function Tests:  Recent Labs Lab 12/16/16 0210 12/20/16 0314  AST 111* 30  ALT 472* 123*  ALKPHOS 112 81  BILITOT 0.5 0.4  PROT 5.8* 5.7*  ALBUMIN 2.8* 2.6*    CBG:  Recent Labs Lab 12/21/16 1923 12/22/16 0003 12/22/16 0410 12/22/16 0739 12/22/16 1208  GLUCAP 115* 114* 125* 109* 111*     Recent Results (from the past 240 hour(s))  Culture, blood (routine x 2)     Status: None   Collection Time: 12/12/16  8:07 PM  Result Value Ref Range Status   Specimen Description BLOOD RIGHT HAND  Final   Special Requests  BOTTLES DRAWN AEROBIC AND ANAEROBIC 5CC  Final   Culture NO GROWTH 5 DAYS  Final   Report Status 12/17/2016 FINAL  Final  Culture, blood (routine x 2)     Status: None   Collection Time: 12/12/16  8:43 PM  Result Value Ref Range Status   Specimen Description BLOOD LEFT FOREARM  Final   Special Requests BOTTLES DRAWN AEROBIC AND ANAEROBIC 5CC  Final   Culture NO GROWTH 5 DAYS  Final   Report Status 12/17/2016 FINAL  Final  Respiratory Panel by PCR  Status: None   Collection Time: 12/13/16 12:01 AM  Result Value Ref Range Status   Adenovirus NOT DETECTED NOT DETECTED Final   Coronavirus 229E NOT DETECTED NOT DETECTED Final   Coronavirus HKU1 NOT DETECTED NOT DETECTED Final   Coronavirus NL63 NOT DETECTED NOT DETECTED Final   Coronavirus OC43 NOT DETECTED NOT DETECTED Final   Metapneumovirus NOT DETECTED NOT DETECTED Final   Rhinovirus / Enterovirus NOT DETECTED NOT DETECTED Final   Influenza A NOT DETECTED NOT DETECTED Final   Influenza B NOT DETECTED NOT DETECTED Final   Parainfluenza Virus 1 NOT DETECTED NOT DETECTED Final   Parainfluenza Virus 2 NOT DETECTED NOT DETECTED Final   Parainfluenza Virus 3 NOT DETECTED NOT DETECTED Final   Parainfluenza Virus 4 NOT DETECTED NOT DETECTED Final   Respiratory Syncytial Virus NOT DETECTED NOT DETECTED Final   Bordetella pertussis NOT DETECTED NOT DETECTED Final   Chlamydophila pneumoniae NOT DETECTED NOT DETECTED Final   Mycoplasma pneumoniae NOT DETECTED NOT DETECTED Final  MRSA PCR Screening     Status: None   Collection Time: 12/13/16 12:03 AM  Result Value Ref Range Status   MRSA by PCR NEGATIVE NEGATIVE Final    Comment:        The GeneXpert MRSA Assay (FDA approved for NASAL specimens only), is one component of a comprehensive MRSA colonization surveillance program. It is not intended to diagnose MRSA infection nor to guide or monitor treatment for MRSA infections.   Culture, respiratory (NON-Expectorated)     Status:  None   Collection Time: 12/13/16 12:14 AM  Result Value Ref Range Status   Specimen Description TRACHEAL ASPIRATE  Final   Special Requests NONE  Final   Gram Stain   Final    MODERATE WBC PRESENT, PREDOMINANTLY PMN RARE SQUAMOUS EPITHELIAL CELLS PRESENT ABUNDANT GRAM POSITIVE COCCI IN PAIRS ABUNDANT GRAM NEGATIVE COCCOBACILLI MODERATE GRAM POSITIVE RODS    Culture ABUNDANT STREPTOCOCCUS PNEUMONIAE  Final   Report Status 12/15/2016 FINAL  Final   Organism ID, Bacteria STREPTOCOCCUS PNEUMONIAE  Final      Susceptibility   Streptococcus pneumoniae - MIC*    ERYTHROMYCIN >=8 RESISTANT Resistant     LEVOFLOXACIN 1 SENSITIVE Sensitive     PENICILLIN Value in next row Sensitive      SENSITIVE<=0.06    CEFTRIAXONE Value in next row Sensitive      SENSITIVE<=0.12    * ABUNDANT STREPTOCOCCUS PNEUMONIAE  Hsv Culture And Typing     Status: Abnormal   Collection Time: 12/15/16  3:23 PM  Result Value Ref Range Status   HSV Culture/Type Comment (A)  Final    Comment: (NOTE) Positive for Herpes simplex virus type-1. Typing was confirmed by monoclonal antibody microscopic immunofluorescence. Performed At: [facility] [address] [place], [place] [id number] Lindon Romp MD DX:[id number]    Source of Sample ORAL  Final  Culture, blood (routine x 2)     Status: None (Preliminary result)   Collection Time: 12/17/16  4:58 PM  Result Value Ref Range Status   Specimen Description BLOOD RIGHT ARM  Final   Special Requests IN PEDIATRIC BOTTLE 2CC  Final   Culture NO GROWTH 4 DAYS  Final   Report Status PENDING  Incomplete  Culture, blood (routine x 2)     Status: None (Preliminary result)   Collection Time: 12/17/16  5:05 PM  Result Value Ref Range Status   Specimen Description BLOOD RIGHT HAND  Final   Special Requests IN PEDIATRIC BOTTLE  3CC  Final   Culture NO GROWTH 4 DAYS  Final   Report Status PENDING  Incomplete  C difficile quick scan w PCR reflex     Status: Abnormal    Collection Time: 12/21/16  6:25 PM  Result Value Ref Range Status   C Diff antigen POSITIVE (A) NEGATIVE Final   C Diff toxin NEGATIVE NEGATIVE Final   C Diff interpretation Results are indeterminate. See PCR results.  Final  Clostridium Difficile by PCR     Status: Abnormal   Collection Time: 12/21/16  6:25 PM  Result Value Ref Range Status   Toxigenic C Difficile by pcr POSITIVE (A) NEGATIVE Final    Comment: Positive for toxigenic C. difficile with little to no toxin production. Only treat if clinical presentation suggests symptomatic illness.      Radiology Studies: Dg Chest Port 1 View  Result Date: 12/21/2016 CLINICAL DATA:  Acute respiratory failure with hypoxia EXAM: PORTABLE CHEST 1 VIEW COMPARISON:  12/20/2016 FINDINGS: Cavitary lesion in the left mid lung again noted, stable. Right lung is clear. Heart is normal size. NG tube is in the stomach. Interval extubation. IMPRESSION: Cavitary area in the left mid lung, unchanged. Interval extubation. Electronically Signed   By: Rolm Baptise M.D.   On: 12/21/2016 07:28   Dg Abd Portable 1v  Result Date: 12/20/2016 CLINICAL DATA:  Initial evaluation for NG tube placement. EXAM: PORTABLE ABDOMEN - 1 VIEW COMPARISON:  Prior radiograph from 12/13/2016. FINDINGS: NG tube in place with tip and side hole overlying the gastric body. Side hole well beyond the GE junction. Tip projects inferiorly. Multiple dilated gas-filled loops of bowel seen within the abdomen measuring up to 7.3 cm. Visualized lung bases grossly clear. IMPRESSION: 1. Tip and side hole of NG tube overlying the gastric body. Side hole beyond the GE junction. Tip projects inferiorly. 2. Multiple gas-filled loops of bowel seen throughout the partially visualized abdomen. Findings may reflect sequelae of ileus or possibly distal obstructive process. Electronically Signed   By: Jeannine Boga M.D.   On: 12/20/2016 22:02   Dg Swallowing Func-speech Pathology  Result Date:  12/22/2016 Objective Swallowing Evaluation: Type of Study: MBS-Modified Barium Swallow Study Patient Details Name: [name] MRN: [id number] Date of Birth: 1966/05/26 Today's Date: 12/22/2016 Time: SLP Start Time (ACUTE ONLY): 0951-SLP Stop Time (ACUTE ONLY): 1012 SLP Time Calculation (min) (ACUTE ONLY): 21 min Past Medical History: Past Medical History: Diagnosis Date . Allergic rhinitis  . Arthritis  . Asthma  . Back pain  . Bipolar 1 disorder ([place])  . Chronic low back pain 03/06/2014 . COPD (chronic obstructive pulmonary disease) ([place])  . Depression  . Fibroid  . Hypertension  . IBS (irritable bowel syndrome)  . Migraine  . Scoliosis  Past Surgical History: Past Surgical History: Procedure Laterality Date . CHOLECYSTECTOMY   . DILATION AND CURETTAGE OF UTERUS   . EXPLORATORY LAPAROTOMY    WHEN PATIENT WAS YOUNG . RIGHT FOOT SURGERY   . TUBAL LIGATION Bilateral  HPI: 51 y/o F, 45 pk year smoker, with Hx of COPD/asthma and bipolar disorder admitted 1/19 with SOB. Working diagnosis of acute asthma exacerbation, multilobar PNA with resultant hypoxic respiratory failure. Intubated 12/12/16 to 12/20/16.  Subjective: alert for evaluation, requires cues to maintain alertness Assessment / Plan / Recommendation CHL IP CLINICAL IMPRESSIONS 12/22/2016 Therapy Diagnosis Mild pharyngeal phase dysphagia;Moderate pharyngeal phase dysphagia  Clinical Impression Ms. Hartnett was cooperative but drowsy during the MBS and required moderate cueing to attend to  the assessment and POs. Pt exhibits mild-moderate pharyngeal dysphagia characterized by delayed swallow initiation that results in silent penetration (able to clear with 2nd reflexive swallow) during nectar thick consistencies. Thicker textures were better contained in the valleculae before the swallow. Backflow from upper esophageal segment to pharynx also noted with nectar thick liquids. Due to prolonged intubation, fluctuating mentation/alertness, and hx of CVA, recommend diet  of honey thick liquids, pureed solids, meds whole in puree, with full supervision during mealtimes, small bites/sips, seated upright. ST will f/u with pt for treatment to assess safety/efficiency of swallowing function and possible diet upgrade.  Impact on safety and function Moderate aspiration risk   CHL IP TREATMENT RECOMMENDATION 12/22/2016 Treatment Recommendations Therapy as outlined in treatment plan below   Prognosis 12/22/2016 Prognosis for Safe Diet Advancement Good Barriers to Reach Goals -- Barriers/Prognosis Comment -- CHL IP DIET RECOMMENDATION 12/22/2016 SLP Diet Recommendations Dysphagia 1 (Puree) solids;Honey thick liquids Liquid Administration via Cup;No straw Medication Administration Whole meds with puree Compensations Slow rate;Small sips/bites Postural Changes Seated upright at 90 degrees;Remain semi-upright after after feeds/meals (Comment)   CHL IP OTHER RECOMMENDATIONS 12/22/2016 Recommended Consults -- Oral Care Recommendations Oral care BID Other Recommendations Order thickener from pharmacy;Prohibited food (jello, ice cream, thin soups);Remove water pitcher   CHL IP FOLLOW UP RECOMMENDATIONS 12/22/2016 Follow up Recommendations Skilled Nursing facility   [facility] IP FREQUENCY AND DURATION 12/22/2016 Speech Therapy Frequency (ACUTE ONLY) min 2x/week Treatment Duration 2 weeks      CHL IP ORAL PHASE 12/22/2016 Oral Phase WFL Oral - Pudding Teaspoon -- Oral - Pudding Cup -- Oral - Honey Teaspoon -- Oral - Honey Cup -- Oral - Nectar Teaspoon -- Oral - Nectar Cup -- Oral - Nectar Straw -- Oral - Thin Teaspoon -- Oral - Thin Cup -- Oral - Thin Straw -- Oral - Puree -- Oral - Mech Soft -- Oral - Regular -- Oral - Multi-Consistency -- Oral - Pill -- Oral Phase - Comment --  CHL IP PHARYNGEAL PHASE 12/22/2016 Pharyngeal Phase Impaired Pharyngeal- Pudding Teaspoon -- Pharyngeal -- Pharyngeal- Pudding Cup -- Pharyngeal -- Pharyngeal- Honey Teaspoon -- Pharyngeal -- Pharyngeal- Honey Cup Delayed swallow  initiation-vallecula;Other (Comment) Pharyngeal -- Pharyngeal- Nectar Teaspoon Delayed swallow initiation-pyriform sinuses;Penetration/Aspiration before swallow;Other (Comment) Pharyngeal Material enters airway, remains ABOVE vocal cords then ejected out Pharyngeal- Nectar Cup Delayed swallow initiation-pyriform sinuses;Other (Comment);Penetration/Aspiration before swallow Pharyngeal Material enters airway, remains ABOVE vocal cords then ejected out Pharyngeal- Nectar Straw -- Pharyngeal -- Pharyngeal- Thin Teaspoon -- Pharyngeal -- Pharyngeal- Thin Cup -- Pharyngeal -- Pharyngeal- Thin Straw -- Pharyngeal -- Pharyngeal- Puree Delayed swallow initiation-vallecula Pharyngeal -- Pharyngeal- Mechanical Soft -- Pharyngeal -- Pharyngeal- Regular -- Pharyngeal -- Pharyngeal- Multi-consistency -- Pharyngeal -- Pharyngeal- Pill -- Pharyngeal -- Pharyngeal Comment --  CHL IP CERVICAL ESOPHAGEAL PHASE 12/22/2016 Cervical Esophageal Phase Impaired Pudding Teaspoon -- Pudding Cup -- Honey Teaspoon -- Honey Cup -- Nectar Teaspoon -- Nectar Cup Esophageal backflow into the pharynx Nectar Straw -- Thin Teaspoon -- Thin Cup -- Thin Straw -- Puree -- Mechanical Soft -- Regular -- Multi-consistency -- Pill -- Cervical Esophageal Comment -- No flowsheet data found. Germain Osgood 12/22/2016, 11:25 AM  Note populated for Fransisca Kaufmann, student SLP Germain Osgood, M.A. CCC-SLP [phone]               Medications:  Scheduled: .  stroke: mapping our early stages of recovery book   Does not apply Once  . ampicillin-sulbactam (UNASYN) IV  3 g Intravenous Q6H  .  aspirin  300 mg Rectal Daily   Or  . aspirin  325 mg Per Tube Daily  . budesonide (PULMICORT) nebulizer solution  0.25 mg Nebulization BID  . chlorhexidine gluconate (MEDLINE KIT)  15 mL Mouth Rinse BID  . clonazepam  1 mg Per Tube BID  . escitalopram  40 mg Oral Daily  . famotidine (PEPCID) IV  20 mg Intravenous Q12H  . heparin subcutaneous  5,000 Units  Subcutaneous Q8H  . insulin aspart  0-9 Units Subcutaneous Q4H  . ipratropium-albuterol  3 mL Nebulization TID  . lamoTRIgine  100 mg Per Tube BID  . mouth rinse  15 mL Mouth Rinse QID  . metoprolol tartrate  25 mg Per Tube BID  . [START ON 12/23/2016] predniSONE  10 mg Per Tube Q breakfast  . QUEtiapine  150 mg Per Tube BID  . vancomycin  125 mg Per Tube QID   Continuous: . albuterol Stopped (12/12/16 2234)  . dextrose 75 mL/hr at 12/22/16 0020  . feeding supplement (VITAL AF 1.2 CAL) Stopped (12/20/16 1901)   DSK:AJGOTL chloride, acetaminophen, hydrALAZINE, ibuprofen, lidocaine, morphine injection, RESOURCE THICKENUP CLEAR, white petrolatum  Assessment/Plan:  Active Problems:   Pneumonia   Pressure injury of skin   Acute kidney injury ([place])   HCAP (healthcare-associated pneumonia)   Transaminitis    Acute asthma exacerbation Appears to be resolving. Steroids are being tapered.   Acute hypoxic respiratory failure secondary to Multilobar PNA Improved. Continue to monitor. Saturating normally on room air.  Acute diarrhea/possible C. difficile Patient continues to have diarrhea according to the nursing staff. Her abdomen is distended. C. difficile was sent and the results were equivocal. Considering diarrhea, Donald distention, elevated WBC and the fact that she has been on antibiotics, we will treat with the enteral vancomycin for now. Proceed with CT scan of the abdomen and pelvis due to findings of ileus noted 2 days ago on x-ray.  Multilobar pneumonia with strep pneumonia with cavitary lesion Cavitary lesion noted in the lung. Cultures from tracheal aspirate were positive for strep pneumo. Per pulmonology she needs Unasyn for a minimum of 3 weeks. CT scan to be done around 2-3 weeks from now to monitor cavitary lesion. HIV nonreactive.  Demand ischemia with history of grade 2 diastolic dysfunction Troponin was elevated up to 1.25. Subsequently started trending down. Patient  is on a beta blocker. Echocardiogram report as above. No wall motion abnormalities were noted. May benefit from outpatient cardiology evaluation.  AKI Creatinine was elevated up to 3.46. She was given IV fluids with improvement.  Hypokalemia This was repleted aggressively. Potassium 5.1 today. Recheck tomorrow.  Hypernatremia Sodium level is stable. Continue to monitor.  Ischemic hepatitis At admission AST was 2024 and ALT was 1366. Most likely due to shock liver. Hepatitis panel negative. LFTs have improved.  Dysphagia Based therapy is following. Modified barium swallow today. She has NG tube in place.  Leukocytosis WBC remains elevated. Continue to treat infections.  Acute Anemia Hgb 10.4 on admission. Baseline around 12. Monitor. No overt bleeding has been noted.  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  Acute encephalopathy Remains confused. Distracted. Continue to monitor for now.   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 Patient seen by neurology. Stroke thought to be secondary to acute illness. Aspirin recommended. Secondary risk reduction.   Bipolar d/o Continue seroquel, lexapro, and lamotrigine  ADDENDUM Received a call from the radiologist regarding patient's CT abdomen and pelvis. Some distention of the colon seen but  not to the extent of megacolon. However, finding was also made of a small pneumothorax in the left lung. This was not seen previously. Patient does have a cavitary lesion. CT chest has been ordered. Discussed with Dr. Vaughan Browner with pulmonology who states that she may have ruptured part of the cavity. Pulmonology will evaluate patient tomorrow. She remains stable from a respiratory standpoint and does not need any intervention for the pneumothorax at this time unless the CT chest shows something more concerning.  DVT Prophylaxis: Subcutaneous heparin   Code Status: Full code  Family Communication: Discussed with the patient's daughter    Disposition Plan: CT scan of the abdomen and pelvis to be done today. Initiated vancomycin. PT evaluation starting tomorrow if remains stable.    LOS: 10 days   [facility] Pager [phone] 12/22/2016, 12:46 PM  If 7PM-7AM, please contact night-coverage at [email or web address], password [facility]

## 2016-12-22 NOTE — Progress Notes (Signed)
Nutrition Follow-up  DOCUMENTATION CODES:   Not applicable  INTERVENTION:  Provide Magic cup TID between meals, each supplement provides 290 kcal and 9 grams of protein.  Encourage adequate PO intake.   NUTRITION DIAGNOSIS:   Inadequate oral intake related to inability to eat as evidenced by NPO status; diet advanced to dysphagia 1 with honey thick, ongoing  GOAL:   Patient will meet greater than or equal to 90% of their needs; not met  MONITOR:   PO intake, Supplement acceptance, Diet advancement, Labs, Weight trends, Skin, I & O's  REASON FOR ASSESSMENT:   Consult Enteral/tube feeding initiation and management  ASSESSMENT:   51 year old with severe persistent asthma, COPD, hypertension, bipolar. Admitted with acute hypoxic respiratory failure, multilobar Streptococcus pneumonia. Pt extubated 1/27.   MBS done today. Pt with mild to moderate pharyngeal phase dysphagia. Diet has been advanced to a dysphagia 1 diet with honey thick liquids. Pt unable to stay awake to respond to questions. No family at bedside. Lunch tray was untouched during time of visit. RD to order Magic cup to aid in caloric and protein needs. RD to continue to monitor. Noted Vital AF 1.2 formula has not been discontinued. RD to discontinue.   Labs and medications reviewed. Sodium elevated at 147.   Diet Order:  DIET - DYS 1 Room service appropriate? Yes; Fluid consistency: Honey Thick  Skin:  Wound (see comment) (Unstageable to lip)  Last BM:  1/29  Height:   Ht Readings from Last 1 Encounters:  12/13/16 _0  (1.626 m)    Weight:   Wt Readings from Last 1 Encounters:  12/21/16 162 lb 14.7 oz (73.9 kg)    Ideal Body Weight:  54.54 kg  BMI:  Body mass index is 27.97 kg/m.  Estimated Nutritional Needs:   Kcal:  7972-8206  Protein:  90-100 grams  Fluid:  Per MD  EDUCATION NEEDS:   No education needs identified at this time  Corrin Parker, MS, RD, LDN Pager # [phone] After  hours/ weekend pager # [phone]

## 2016-12-22 NOTE — Consult Note (Signed)
Name: [name] MRN: [id number] DOB: August 13, 1966    LOS: 11  PCCM Consult NOTE  History of Present Illness: Patient is a 51 yo F recently discharged from the ICU after intubation (1/19 - 1/27) for acute hypoxic respiratory failure due to multilobar PNA. Her past medical history is significant for asthma, COPD, and bipolar disorder. She presented on 1/19 via EMS for AMS and lethargy. History was initially provided by the daughter due to her acute encephalopathy. Her daughter reported a productive cough x 3 days with associated fatigue, subjective fevers, chills, myalgias, and confusion. Her O2 sat was in the 60s and she was initially placed on BiPAP without improvement and ultimately intubated. Work up revealed a multilobar PNA on CXR. She was improved with breathing treatments and antibiotics and was extubated 1/27.   Today, PCCM was called to evaluate patient for increasing oxygen requirement from room air to 5L [place], worsening tachypnea, and tachycardia. CT abdomen performed earlier today revealed a colonic ileus without evidence of bowel obstruction. There was also note of a partially imaged pneumothorax at the left lung base that was characterized as "tiny" on follow up chest CT. Her known left upper lobe lung abscess appeared unchanged with interval improvement in patchy airspace opacities bilaterally. NGT however was noted to be malpositioned ending at the proximal esophagus. Per RN, NGT was removed earlier today. She also passed her speech evaluation and diet was advanced to dysphagia 1. She was given prn morphine and oxy 15 prior to our evaluation.   Lines / Drains: NGT 1/29 >>  Cultures: Blood 1/19 >> Negative Respiratory 1/20 >> Strep Pneumo  RVP 1/20 >> Negative  Oral HSV 1/22 >> positive (completed acyclovir)  Blood 1/24 >> Negative  C diff 1/28 >> Antigen & PCR positive   Antibiotics: Azithromycin 1/19 - 1/21 Ceftriaxone 1/19 - 1/23 Acyclovir 1/22 - 1/27 Unasyn 1/24 >> Vanco  1/29 >>  Tests / Events: Admit / Intubate 1/19 Extubated 1/27   The patient is sedated, intubated and unable to provide history, which was obtained for available medical records.    Past Medical History:  Diagnosis Date  . Allergic rhinitis   . Arthritis   . Asthma   . Back pain   . Bipolar 1 disorder ([place])   . Chronic low back pain 03/06/2014  . COPD (chronic obstructive pulmonary disease) ([place])   . Depression   . Fibroid   . Hypertension   . IBS (irritable bowel syndrome)   . Migraine   . Scoliosis    Past Surgical History:  Procedure Laterality Date  . CHOLECYSTECTOMY    . DILATION AND CURETTAGE OF UTERUS    . EXPLORATORY LAPAROTOMY     WHEN PATIENT WAS YOUNG  . RIGHT FOOT SURGERY    . TUBAL LIGATION Bilateral    Prior to Admission medications   Medication Sig Start Date End Date Taking? Authorizing Provider  acetaminophen (TYLENOL) 325 MG tablet Take 325-650 mg by mouth every 6 (six) hours as needed for headache (or pain).   Yes Historical Provider, MD  albuterol (PROAIR HFA) 108 (90 Base) MCG/ACT inhaler Inhale 2 puffs into the lungs every 6 (six) hours as needed for wheezing or shortness of breath. Patient taking differently: Inhale 2 puffs into the lungs 3 (three) times daily.  12/01/16   Shaylar Charmian Muff, MD  albuterol (PROVENTIL) (2.5 MG/3ML) 0.083% nebulizer solution Take 3 mLs (2.5 mg total) by nebulization every 6 (six) hours as needed for wheezing or shortness  of breath. 08/19/16   Valentina Shaggy, MD  Azelastine HCl 0.15 % SOLN Place 1 spray into both nostrils 2 (two) times daily. 12/01/16   Shaylar Charmian Muff, MD  beclomethasone (QVAR) 40 MCG/ACT inhaler Inhale 4 puffs into the lungs daily at 12 noon. 08/04/16   Valentina Shaggy, MD  benzonatate (TESSALON PERLES) 100 MG capsule Take 1 capsule (100 mg total) by mouth 3 (three) times daily as needed for cough. 08/14/16   Valentina Shaggy, MD  calcium carbonate (TUMS - DOSED IN MG ELEMENTAL  CALCIUM) 500 MG chewable tablet Chew 1 tablet (200 mg of elemental calcium total) by mouth 2 (two) times daily. Patient not taking: Reported on 12/01/2016 12/17/15   Debbe Odea, MD  clonazePAM (KLONOPIN) 1 MG tablet Take 1 mg by mouth daily as needed for anxiety.  04/15/16   Historical Provider, MD  ergocalciferol (VITAMIN D2) 50000 units capsule Take 50,000 Units by mouth once a week.     Historical Provider, MD  escitalopram (LEXAPRO) 20 MG tablet Take 40 mg by mouth daily.     Historical Provider, MD  gabapentin (NEURONTIN) 400 MG capsule Take 800 mg by mouth 3 (three) times daily.     Historical Provider, MD  hydrOXYzine (VISTARIL) 25 MG capsule Take 25 mg by mouth 3 (three) times daily as needed for anxiety. 10/08/16   Historical Provider, MD  ipratropium (ATROVENT) 0.03 % nasal spray 1-2 sprays every 6 hours as needed Patient taking differently: Place 1-2 sprays into both nostrils every 6 (six) hours as needed for rhinitis.  12/01/16   Shaylar Charmian Muff, MD  lamoTRIgine (LAMICTAL) 100 MG tablet Take 100 mg by mouth 2 (two) times daily.    Historical Provider, MD  levocetirizine (XYZAL) 5 MG tablet Take 1 tablet (5 mg total) by mouth every evening. 05/14/16   Gean Quint, MD  loratadine (CLARITIN) 10 MG tablet Take 1 tablet (10 mg total) by mouth every morning. 12/01/16   Shaylar Charmian Muff, MD  meloxicam (MOBIC) 15 MG tablet Take 15 mg by mouth daily.    Historical Provider, MD  methocarbamol (ROBAXIN) 750 MG tablet Take 750 mg by mouth 3 (three) times daily.     Historical Provider, MD  metoprolol tartrate (LOPRESSOR) 25 MG tablet Take 25 mg by mouth 2 (two) times daily. Reported on 02/21/2016    Historical Provider, MD  mometasone-formoterol (DULERA) 200-5 MCG/ACT AERO Inhale 2 puffs into the lungs 2 (two) times daily. 08/04/16   Valentina Shaggy, MD  ondansetron (ZOFRAN) 4 MG tablet Take 4 mg by mouth every 8 (eight) hours as needed for nausea or vomiting.    Historical Provider,  MD  oxyCODONE (ROXICODONE) 15 MG immediate release tablet Take 15 mg by mouth 3 (three) times daily.     Historical Provider, MD  QUEtiapine (SEROQUEL) 300 MG tablet Take 300 mg by mouth at bedtime.    Historical Provider, MD  ranitidine (ZANTAC) 300 MG tablet Take 300 mg by mouth 2 (two) times daily. 10/24/16   Historical Provider, MD  rosuvastatin (CRESTOR) 10 MG tablet Take 10 mg by mouth daily. Reported on 05/14/2016    Historical Provider, MD  Spacer/Aero-Holding Chambers (AEROCHAMBER PLUS FLO-VU MEDIUM) MISC Use with metered dose inhaler 03/27/16   Roselyn Malachy Moan, MD  tiotropium (SPIRIVA HANDIHALER) 18 MCG inhalation capsule Place 1 capsule (18 mcg total) into inhaler and inhale daily. 12/01/16   [place], MD   Allergies Allergies  Allergen Reactions  .  Cymbalta [Duloxetine Hcl] Other (See Comments)    Mood changes...mean  . Mucinex [Guaifenesin Er] Other (See Comments)    Possible allergy, per patient (??)  . Nyquil Multi-Symptom [Pseudoeph-Doxylamine-Dm-Apap] Hives  . Paxil [Paroxetine Hcl] Other (See Comments)    Mean and aggresive  . Promethazine Itching  . Tramadol Itching  . Wellbutrin [Bupropion] Other (See Comments)    Rapid heartrate  . Zoloft [Sertraline] Other (See Comments)    Anger    Family History Family History  Problem Relation Age of Onset  . Stroke Mother   . Hypertension Brother   . Hypertension Brother   . Hypertension Brother   . Allergic rhinitis Neg Hx   . Angioedema Neg Hx   . Asthma Neg Hx   . Atopy Neg Hx   . Eczema Neg Hx   . Immunodeficiency Neg Hx   . Urticaria Neg Hx     Social History  reports that she has been smoking Cigarettes.  She has been smoking about 0.50 packs per day. She has never used smokeless tobacco. She reports that she does not drink alcohol or use drugs.  Review Of Systems  11 points review of systems is negative with an exception of listed in HPI.  Vital Signs: Temp:  [97.5 F (36.4 C)-99.2 F (37.3  C)] 98.8 F (37.1 C) (01/29 2128) Pulse Rate:  [108-118] 116 (01/29 2244) Resp:  [18-40] 38 (01/29 2128) BP: (136-177)/(86-105) 143/86 (01/29 2244) SpO2:  [91 %-98 %] 95 % (01/29 2244) I/O last 3 completed shifts: In: 3302.5 [P.O.:60; I.V.:2617.5; Other:75; IV Piggyback:550] Out: [id number] [Urine:1146]  Physical Examination: General: Moaning in pain  Neuro:  Drowsy but easily awakes and oriented x 3  HEENT:  PERRL Neck:  Supple, trache midline   Cardiovascular:  Tachycardic but regular rhythm, no murmurs, rubs or gallops Lungs:  Course bronchial breath sounds bilaterally, rare scattered expiratory wheezes Abdomen:  Distended, tender to palpation in all 4 quadrants, high pitched and hyperactive bowel sounds, + guarding  Extremities: Distal pulses intact, no edema  Skin:  No rashes or erythema   Ventilator settings:    Labs and Imaging:  Personally reviewed.   CXR 1/19 >> Bilateraly multilobar PNA CT Head 1/19 >> Left occipital lobe acute vs subacute infarction  US abdomen 1/20 >> Postcholecystecomty without biliary dilation  CXR 1/20 >> Bilateral pulmonary opacities (L>R) unchanged  Brain MRI 1/21 >> Left occipital lobe questioned by CT is negative by  MRI  CXR 1/23 >> Persistent infiltrates, new area of cavitation at the lateral to mid lower left lung consistent with an abscess  CT Chest 1/23 >> Left upper lobe lung abscess, 5.9 cm vs neoplastic process  Modified Barium Swallow Study 1/29 >> moderate pharyngeal phase dysphagia  CT Abdomen Pelvis 1/29 >> Colonic ileus, no evidence of SBO CT Chest 1/29 >> Large cavitary lesion stable in size suspicious for abcess, interval improvement in patchy airspace disease bilaterally, enteric tube noted to end at the proximal esophagus (recommended clinical correlation)  Assessment and Plan: Patient is a 51 yo F with pmhx significant for asthma, COPD, bipolar disorder who was recently intubated for multilobar PNA, now with tachypnea and abdominal  distention, likely multifactorial due to colonic ileus and asthma. For now, we will place an NGT, obtain KUB, check electrolytes and make patient NPO. Opioids may be worsening ileus. Also, patient may be developing severe complicated C. Diff causing ileus. She will be transferred to SDU, remain on [facility] service, and we will continue  to follow along. Flagyl has been added, vanc switched to rectal enema as not tolerating po.  Assessment and Plan:  PULMONARY  ASSESSMENT: Acute Hypoxic Respiratory Failure: Now requiring 5L [place], likely 2/2 asthma and splinting from abdominal pain and distention. CXR opacities appear overall improved from yesterday. ABG with PaO2 of 71.7 on 5L [place]. Left Fluid Filled Cavitary Lesions:  Concerning for abscess, on Unasyn. Multilobar PNA Asthma: Wheezing on exam Trace PTX PLAN:   Relieve abdominal pressure with NGT Continue Duonebs Q6H Albuterol Q2H prn  Continue prednisone Continue Unasyn  CARDIOVASCULAR  ASSESSMENT:  Sinus Tachycardia HTN HFpEF PLAN:  Metoprolol 5 mg Q6H Hydralazine Q4H prn  On ASA  RENAL  ASSESSMENT:   Hypokalemia: Last K 5.1, improved from prior Hypernatremia PLAN:   D5 75 cc/hr Aggressively replete potassium, mag in the setting of Ileus STAT CMET  GASTROINTESTINAL  ASSESSMENT:   Adynamic Ileus likely 2/2 opioids, hypokalemia, possible C. Diff Dysphagia C. Diff Colitis: Started on empiric po vanc given increased stool output. Antigen positive, toxin negative. PCR positive. No colitis seen on CT scan. Moderate amount of liquid brown stool per colostomy. Ileus likely due to C. Diff versus opioids. If due to C. Diff, patient could be considered severe complicated C. Diff; will need close monitoring, will add IV Flagyl, switch to rectal vanc enemas as NPO and on NGT suction.  PLAN:   Switch to Vanc rectal enemas 500 mg Q6H Serial abdominal exams Add on Flagyl Place NGT Bowel Rest Consider GI/Surgery consults in the am STAT  KUB Stop opioids Toradol 15 mg Q8H prn pain  Aggressively replete electrolytes STAT CMET  HEMATOLOGIC  ASSESSMENT:   Leukocytosis: 2/2 steroids, lung abscess, C. Diff PLAN:  Trend CBC, STAT CBC  INFECTIOUS  ASSESSMENT:   Left Cavitary Fluid Filled Lesion concerning for Abscess C. 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 Patient seen by neurology. Stroke thought to be secondary to acute illness.  Acute Encephalopathy PLAN:   ASA QD Holding home seroquel, lexapro and lamictal while NPO  Daughter updated at bedside.   The patient is critically ill with multiple organ systems failure and requires high complexity decision making for assessment and support, frequent evaluation and titration of therapies, application of advanced monitoring technologies and extensive interpretation of multiple databases. Critical Care Time devoted to patient care services described in this note is 33 minutes.  Martyn Malay, DO PGY-3 Internal Medicine Resident Pager # [phone] 12/22/2016 11:59 PM

## 2016-12-23 ENCOUNTER — Inpatient Hospital Stay (HOSPITAL_COMMUNITY): Payer: Medicaid Other

## 2016-12-23 DIAGNOSIS — J939 Pneumothorax, unspecified: Secondary | ICD-10-CM

## 2016-12-23 DIAGNOSIS — K567 Ileus, unspecified: Secondary | ICD-10-CM | POA: Diagnosis not present

## 2016-12-23 DIAGNOSIS — J13 Pneumonia due to Streptococcus pneumoniae: Secondary | ICD-10-CM | POA: Diagnosis present

## 2016-12-23 DIAGNOSIS — R140 Abdominal distension (gaseous): Secondary | ICD-10-CM | POA: Insufficient documentation

## 2016-12-23 DIAGNOSIS — A0472 Enterocolitis due to Clostridium difficile, not specified as recurrent: Secondary | ICD-10-CM | POA: Diagnosis not present

## 2016-12-23 LAB — CBC
HCT: 33.7 % — ABNORMAL LOW (ref 36.0–46.0)
Hemoglobin: 10.4 g/dL — ABNORMAL LOW (ref 12.0–15.0)
MCH: 28.8 pg (ref 26.0–34.0)
MCHC: 30.9 g/dL (ref 30.0–36.0)
MCV: 93.4 fL (ref 78.0–100.0)
Platelets: 551 10*3/uL — ABNORMAL HIGH (ref 150–400)
RBC: 3.61 MIL/uL — ABNORMAL LOW (ref 3.87–5.11)
RDW: 16.5 % — AB (ref 11.5–15.5)
WBC: 25.2 10*3/uL — ABNORMAL HIGH (ref 4.0–10.5)

## 2016-12-23 LAB — COMPREHENSIVE METABOLIC PANEL
ALBUMIN: 3 g/dL — AB (ref 3.5–5.0)
ALK PHOS: 75 U/L (ref 38–126)
ALT: 67 U/L — ABNORMAL HIGH (ref 14–54)
ALT: 63 U/L — ABNORMAL HIGH (ref 14–54)
ANION GAP: 12 (ref 5–15)
ANION GAP: 11 (ref 5–15)
AST: 23 U/L (ref 15–41)
AST: 26 U/L (ref 15–41)
Albumin: 2.7 g/dL — ABNORMAL LOW (ref 3.5–5.0)
Alkaline Phosphatase: 71 U/L (ref 38–126)
BILIRUBIN TOTAL: 0.7 mg/dL (ref 0.3–1.2)
BUN: 12 mg/dL (ref 6–20)
BUN: 12 mg/dL (ref 6–20)
CALCIUM: 8.9 mg/dL (ref 8.9–10.3)
CO2: 22 mmol/L (ref 22–32)
CO2: 25 mmol/L (ref 22–32)
CREATININE: 0.57 mg/dL (ref 0.44–1.00)
Calcium: 8.9 mg/dL (ref 8.9–10.3)
Chloride: 109 mmol/L (ref 101–111)
Chloride: 109 mmol/L (ref 101–111)
Creatinine, Ser: 0.55 mg/dL (ref 0.44–1.00)
GFR calc Af Amer: >60 mL/min (ref >60)
Glucose, Bld: 109 mg/dL — ABNORMAL HIGH (ref 65–99)
Glucose, Bld: 108 mg/dL — ABNORMAL HIGH (ref 65–99)
POTASSIUM: 3.6 mmol/L (ref 3.5–5.1)
Potassium: 3.5 mmol/L (ref 3.5–5.1)
SODIUM: 145 mmol/L (ref 135–145)
Sodium: 143 mmol/L (ref 135–145)
TOTAL PROTEIN: 5.6 g/dL — AB (ref 6.5–8.1)
Total Bilirubin: 0.7 mg/dL (ref 0.3–1.2)
Total Protein: 5.3 g/dL — ABNORMAL LOW (ref 6.5–8.1)

## 2016-12-23 LAB — GLUCOSE, CAPILLARY
GLUCOSE-CAPILLARY: 125 mg/dL — AB (ref 65–99)
GLUCOSE-CAPILLARY: 139 mg/dL — AB (ref 65–99)
GLUCOSE-CAPILLARY: 118 mg/dL — AB (ref 65–99)
GLUCOSE-CAPILLARY: 124 mg/dL — AB (ref 65–99)
GLUCOSE-CAPILLARY: 113 mg/dL — AB (ref 65–99)
GLUCOSE-CAPILLARY: 114 mg/dL — AB (ref 65–99)
Glucose-Capillary: 113 mg/dL — ABNORMAL HIGH (ref 65–99)
Glucose-Capillary: 109 mg/dL — ABNORMAL HIGH (ref 65–99)

## 2016-12-23 LAB — MAGNESIUM: MAGNESIUM: 1.8 mg/dL (ref 1.7–2.4)

## 2016-12-23 LAB — LACTIC ACID, PLASMA: Lactic Acid, Venous: 1 mmol/L (ref 0.5–1.9)

## 2016-12-23 MED ORDER — LORAZEPAM 2 MG/ML IJ SOLN
0.5000 mg | Freq: Once | INTRAMUSCULAR | Status: AC
  Administered 2016-12-23: 0.5 mg via INTRAVENOUS
  Filled 2016-12-23: qty 1

## 2016-12-23 MED ORDER — LORAZEPAM 2 MG/ML IJ SOLN
1.0000 mg | Freq: Once | INTRAMUSCULAR | Status: AC
  Administered 2016-12-23: 1 mg via INTRAVENOUS
  Filled 2016-12-23: qty 1

## 2016-12-23 MED ORDER — POTASSIUM CHLORIDE 2 MEQ/ML IV SOLN
INTRAVENOUS | Status: DC
  Administered 2016-12-23 – 2016-12-24 (×3): via INTRAVENOUS
  Filled 2016-12-23 (×6): qty 1000

## 2016-12-23 MED ORDER — VANCOMYCIN HCL 500 MG IV SOLR
500.0000 mg | Freq: Four times a day (QID) | INTRAVENOUS | Status: DC
Start: 2016-12-23 — End: 2016-12-31
  Administered 2016-12-23 – 2016-12-31 (×32): 500 mg via RECTAL
  Filled 2016-12-23 (×37): qty 500

## 2016-12-23 MED ORDER — LEVALBUTEROL HCL 1.25 MG/0.5ML IN NEBU
1.2500 mg | INHALATION_SOLUTION | Freq: Once | RESPIRATORY_TRACT | Status: DC
  Filled 2016-12-23: qty 0.5

## 2016-12-23 MED ORDER — BUDESONIDE 0.5 MG/2ML IN SUSP
0.5000 mg | Freq: Two times a day (BID) | RESPIRATORY_TRACT | Status: DC
  Administered 2016-12-23 – 2016-12-25 (×4): 0.5 mg via RESPIRATORY_TRACT
  Filled 2016-12-23 (×6): qty 2

## 2016-12-23 NOTE — Progress Notes (Signed)
[facility] PROGRESS NOTE  [name] MCE:[id number] DOB: 06/20/1966 DOA: 12/12/2016  PCP: [name], MD  Brief History/Interval Summary: 51 year old Caucasian female with a past medical history of COPD/asthma, bipolar disorder, was admitted to the hospital with shortness of breath. She was found to have pneumonia. She had to be intubated for worsening respiratory failure. She was admitted to the intensive care unit. Extubated and transferred to the floor. Noted to have loose stools. Noted have abdominal distention. During this hospitalization she was also found to have an acute stroke. Dysphagia has been an issue. Patient subsequently developed diarrhea and ileus. Concern for C. difficile. On the night of 1/29 she became short of breath thought to be due to abdominal distention. NG tube was placed. She stabilized.  Reason for Visit: Pneumonia. Diarrhea  Consultants: Critical care medicine. General surgery.  Procedures: None  Antibiotics: Unasyn Vancomycin enemas Intravenous metronidazole  STUDIES:  EKG 1/19>> Sinus tachycardia, T wave inversion in inferior leads which are new compared to previous EKG, QTc 620 CXR 1/19>> Consolidations bilaterally CT head 1/19>> Left occipital lobe probable acute or early subacute infarction. No acute intracranial hemorrhage. 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 There is a small amount of fluid in this structure. Also a 1 cm nodular structure in the left lower lobe with minimal cavitation.  Abd xr 1/27>> bowel distended with gas  Transthoracic echocardiogram Study Conclusions  - Left ventricle: The cavity size was normal. Wall thickness was   normal. Systolic function was vigorous. The estimated ejection   fraction was  in the range of 65% to 70%. Wall motion was normal;   there were no regional wall motion abnormalities. Features are   consistent with a pseudonormal left ventricular filling pattern,   with concomitant abnormal relaxation and increased filling   pressure (grade 2 diastolic dysfunction). - Left atrium: The atrium was mildly dilated. - Right ventricle: The cavity size was mildly dilated. Wall   thickness was normal. - Pulmonary arteries: PA peak pressure: 44 mm Hg (S).  CULTURES: Viral resp panel>> neg Resp Cx>> Abundant strep pneumo Blood Cx 1/19>> NGTD Blood Cx 1/24>> NGTD C diff 1/28>> antigen positive. Toxin negative.  ANTIBIOTICS/VIRALS: Ceftriaxone 1/19 >> 1/24 Azithromycin 1/19 >> 1/22 Acyclovir 1/22 >>  Unasyn 1/24 >>  SIGNIFICANT EVENTS: 1/19  Intubated, admitted 1/27  Extubated  LINES/TUBES: ETT 1/19 >> 1/27 Foley 1/19 >> OG 1/19 >>. Patient pulled it out NG tube 1/29: Patient pulled it out   Subjective/Interval History: Patient remains lethargic, although easily arousable. States that she is thirsty. Continues to complain of abdominal discomfort.   ROS: Unable to do at this time due to confusion  Objective:  Vital Signs  Vitals:   12/23/16 0358 12/23/16 0700 12/23/16 0728 12/23/16 0732  BP: (!) 156/92 (!) 153/92    Pulse: (!) 110 (!) 103    Resp: (!) 34 (!) 21    Temp: 99.4 F (37.4 C) 100.3 F (37.9 C)    TempSrc: Oral Axillary    SpO2: 92% 97% 97% 97%  Weight:      Height:        Intake/Output Summary (Last 24 hours) at 12/23/16 0804 Last data filed at 12/23/16 0723  Gross per 24 hour  Intake  1771.67 ml  Output             1226 ml  Net           545.67 ml   Filed Weights   12/19/16 0500 12/20/16 0500 12/21/16 0352  Weight: 76.3 kg (168 lb 3.2 oz) 72.2 kg (159 lb 2.8 oz) 73.9 kg (162 lb 14.7 oz)    General appearance: Patient remains fatigued. Unresponsive. In no distress. Resp: Diminished air entry at the bases. Coarse  breath sounds. Crackles noted. No wheezing, no rhonchi. Cardio: regular rate and rhythm, S1, S2 normal, no murmur, click, rub or gallop GI: Abdomen appears to be somewhat less distended compared to yesterday, but still remains so. Still is tender in the lower quadrants bilaterally. Bowel sounds are present. No masses or organomegaly. No rebound, rigidity or guarding.  Neurological: Confused, distracted. Moving all her extremities. No obvious facial asymmetry.  Lab Results:  Data Reviewed: I have personally reviewed following labs and imaging studies  CBC:  Recent Labs Lab 12/17/16 0252  12/20/16 0314 12/21/16 0227 12/22/16 0501 12/22/16 2324 12/23/16 0222  WBC 14.1*  < > 19.7* 22.1* 18.0* 24.3* 25.2*  NEUTROABS 12.6*  --   --  18.6*  --   --   --   HGB 10.4*  < > 10.2* 10.9* 11.1* 11.0* 10.4*  HCT 33.0*  < > 33.3* 35.5* 35.3* 35.3* 33.7*  MCV 91.9  < > 93.5 93.7 93.9 92.7 93.4  PLT 281  < > 402* 483* 453* 532* 551*  < > = values in this interval not displayed.  Basic Metabolic Panel:  Recent Labs Lab 12/17/16 0252 12/18/16 0336 12/19/16 0207  12/21/16 0227 12/21/16 0821 12/21/16 1947 12/22/16 0501 12/22/16 2324 12/23/16 0222  NA 144 149* 150*  < > 148*  --  149* 147* 143 145  K 4.0 4.2 3.7  < > 2.6*  --  3.9 5.1 3.6 3.5  CL 104 105 108  < > 111  --  111 113* 109 109  CO2 31 34* 31  < > 27  --  25 23 22 25   GLUCOSE 172* 167* 177*  < > 108*  --  120* 100* 109* 108*  BUN 25* 21* 21*  < > 15  --  14 15 12 12   CREATININE 0.60 0.49 0.53  < > 0.47  --  0.54 0.60 0.55 0.57  CALCIUM 8.7* 8.8* 8.8*  < > 8.9  --  9.2 9.1 8.9 8.9  MG 1.5* 2.1 2.1  --   --  2.1  --  2.3  --  1.8  PHOS 4.6 3.4 2.9  --   --  2.9  --  3.3  --   --   < > = values in this interval not displayed.  GFR: Estimated Creatinine Clearance: 82 mL/min (by C-G formula based on SCr of 0.57 mg/dL).  Liver Function Tests:  Recent Labs Lab 12/20/16 0314 12/22/16 2324 12/23/16 0222  AST 30 23 26   ALT  123* 67* 63*  ALKPHOS 81 75 71  BILITOT 0.4 0.7 0.7  PROT 5.7* 5.6* 5.3*  ALBUMIN 2.6* 3.0* 2.7*    CBG:  Recent Labs Lab 12/22/16 1655 12/22/16 2004 12/23/16 0148 12/23/16 0359 12/23/16 0720  GLUCAP 135* 105* 113* 125* 139*     Recent Results (from the past 240 hour(s))  Hsv Culture And Typing     Status: Abnormal   Collection Time: 12/15/16  3:23 PM  Result Value Ref  Range Status   HSV Culture/Type Comment (A)  Final    Comment: (NOTE) Positive for Herpes simplex virus type-1. Typing was confirmed by monoclonal antibody microscopic immunofluorescence. Performed At: [facility] [place], [place] [id number] Lindon Romp MD SJ:[id number]    Source of Sample ORAL  Final  Culture, blood (routine x 2)     Status: None   Collection Time: 12/17/16  4:58 PM  Result Value Ref Range Status   Specimen Description BLOOD RIGHT ARM  Final   Special Requests IN PEDIATRIC BOTTLE [place]  Final   Culture NO GROWTH 5 DAYS  Final   Report Status 12/22/2016 FINAL  Final  Culture, blood (routine x 2)     Status: None   Collection Time: 12/17/16  5:05 PM  Result Value Ref Range Status   Specimen Description BLOOD RIGHT HAND  Final   Special Requests IN PEDIATRIC BOTTLE 3CC  Final   Culture NO GROWTH 5 DAYS  Final   Report Status 12/22/2016 FINAL  Final  C difficile quick scan w PCR reflex     Status: Abnormal   Collection Time: 12/21/16  6:25 PM  Result Value Ref Range Status   C Diff antigen POSITIVE (A) NEGATIVE Final   C Diff toxin NEGATIVE NEGATIVE Final   C Diff interpretation Results are indeterminate. See PCR results.  Final  Clostridium Difficile by PCR     Status: Abnormal   Collection Time: 12/21/16  6:25 PM  Result Value Ref Range Status   Toxigenic C Difficile by pcr POSITIVE (A) NEGATIVE Final    Comment: Positive for toxigenic C. difficile with little to no toxin production. Only treat if clinical presentation suggests symptomatic illness.        Radiology Studies: Ct Chest Wo Contrast  Result Date: 12/22/2016 CLINICAL DATA:  Evaluate pneumothorax seen on CT of the abdomen and pelvis. Initial encounter. EXAM: CT CHEST WITHOUT CONTRAST TECHNIQUE: Multidetector CT imaging of the chest was performed following the standard protocol without IV contrast. COMPARISON:  CT of the abdomen and pelvis performed earlier today at 3:40 p.m., and CT of the chest performed 12/16/2016 FINDINGS: Cardiovascular: The heart is normal in size. Minimal calcification is seen along the aortic arch. The great vessels are grossly unremarkable in appearance. Mediastinum/Nodes: The mediastinum is unremarkable in appearance. No mediastinal lymphadenopathy is seen. No pericardial effusion is identified. The visualized portions of the thyroid gland are unremarkable. No axillary lymphadenopathy is seen. An apparent enteric tube is noted ending at the proximal esophagus. Lungs/Pleura: There has been interval improvement in patchy airspace opacity within both lungs, likely reflecting improving infectious process. Mild residual hazy airspace opacities are seen particularly at the right middle lobe and left lingula. A large cavitary lesion is again noted at the periphery of the left upper lobe, relatively stable in size, measuring approximately 6.4 x 3.8 cm. The degree of wall thickening has decreased from the prior study, while the amount of fluid within the cavitary lesion has increased. This suggests strongly against malignancy, and is suspicious for a lung abscess. The previously noted small 1 cm minimally cavitary nodule at the left lower lobe has resolved. Left basilar atelectasis is noted. No definite pleural effusion is seen. A tiny left anterior pneumothorax is noted, localized to the anterior left lung base adjacent to the mediastinum, as seen on CT of the abdomen and pelvis, but new from the prior CT of the chest. Upper Abdomen: The visualized portions of the liver and  spleen  are grossly unremarkable. The patient is status post cholecystectomy, with clips noted along the gallbladder fossa. Mild soft tissue inflammation and fluid tracks inferiorly along Gerota's fascia bilaterally. The visualized portions of the pancreas, adrenal glands and kidneys are within normal limits. Musculoskeletal: No acute osseous abnormalities are identified. Chronic partially healed bilateral rib fractures are noted. The visualized musculature is unremarkable in appearance. IMPRESSION: 1. Tiny left anterior pneumothorax, localized to the anterior left lung base adjacent to the mediastinum, as seen on CT of the abdomen and pelvis. 2. Large cavitary lesion again noted at the periphery of the left upper lung lobe, relatively stable in size, measuring 6.4 x 3.8 cm. The degree of wall thickening has decreased from the prior study, while the amount of fluid within the cavitary lesion has increased. This suggests strongly against malignancy, and is suspicious for a lung abscess. 3. Previously noted small 1 cm cavitary nodule at the left lower lobe has resolved. Left basilar atelectasis noted. 4. Interval improvement in patchy airspace opacity within both lungs, likely reflecting improving infectious process. Mild residual hazy opacities at the right middle lobe and left lingula. 5. Apparent enteric tube noted ending at the proximal esophagus. Would correlate clinically. 6. Mild soft tissue inflammation and fluid tracks inferiorly along Gerota's fascia bilaterally. 7. Chronic partially healed bilateral rib fractures noted. Electronically Signed   By: Garald Balding M.D.   On: 12/22/2016 17:51   Ct Abdomen Pelvis W Contrast  Result Date: 12/22/2016 CLINICAL DATA:  Abdominal distension.  Evaluate ileus. EXAM: CT ABDOMEN AND PELVIS WITH CONTRAST TECHNIQUE: Multidetector CT imaging of the abdomen and pelvis was performed using the standard protocol following bolus administration of intravenous contrast. CONTRAST:   120m ISOVUE-300 IOPAMIDOL (ISOVUE-300) INJECTION 61% COMPARISON:  Abdominal x-ray dated 12/20/2016. CT chest/abdomen dated 12/16/2016 FINDINGS: Lower chest: Small pneumothorax at the left lung base anteriorly. Incompletely imaged cavitary mass/consolidation within the inferior aspects of the left upper lobe and/or lingula, as previously described on earlier chest CT. Hepatobiliary: Status post cholecystectomy. Liver appears normal. No bile duct dilatation. Pancreas: Unremarkable. Spleen: Normal in size without focal abnormality. Adrenals/Urinary Tract: Adrenal glands appear normal. Kidneys appear normal without mass, stone or hydronephrosis. Bladder is decompressed by Foley catheter. Stomach/Bowel: There is moderate distention of the transverse colon and right colon containing gas and fluid/stool, measuring 6.6 cm diameter and 7.5 cm diameter respectively. Rectosigmoid colon and descending colon are normal in caliber. Small bowel is normal in caliber. Enteric tube well positioned within the decompressed stomach. Vascular/Lymphatic: Scattered atherosclerotic changes of the normal caliber abdominal aorta. No enlarged lymph nodes seen in the abdomen or pelvis. Reproductive: Unremarkable. Other: Small amount of free fluid in the lower pelvis. Trace free fluid/edema within the upper paracolic gutter regions bilaterally. No abscess collections seen. No free intraperitoneal air. Musculoskeletal: Degenerative changes throughout the slightly scoliotic thoracolumbar spine, mild in degree. No acute or suspicious osseous finding. Multiple old rib fractures bilaterally. IMPRESSION: 1. Moderate distension of the transverse colon and right colon, containing gas and fluid/stool, measuring 6.6 cm diameter and 7.5 cm diameter respectively, most compatible with the given history of ileus as there is no transition point to suggest mechanical large bowel obstruction. 2. Pneumothorax at the left lung base anteriorly, incompletely  imaged, perhaps related to a interval surgical/diagnostic intervention for the previously described cavitary mass/consolidation within the left lung. If no recent surgical/diagnostic intervention to explain this incompletely imaged pneumothorax at the left lung base, recommend chest CT for complete characterization. 3. Small  amount of free fluid in the abdomen and pelvis. No abscess collection seen. No free intraperitoneal air. 4. Aortic atherosclerosis. These results were called by telephone at the time of interpretation on 12/22/2016 at 4:14 pm to Dr. Bonnielee Haff , who verbally acknowledged these results. Electronically Signed   By: Franki Cabot M.D.   On: 12/22/2016 16:15   Dg Chest Port 1 View  Result Date: 12/22/2016 CLINICAL DATA:  Acute hypoxic respiratory failure. Streptococcal pneumonia. EXAM: PORTABLE CHEST 1 VIEW COMPARISON:  12/20/2016, 12/21/2016, 12/22/2016 FINDINGS: Cavitary lesion in the left lung appears to contain more fluid today, as observed on accompanying CT. No other significant interval change. The small left pneumothorax might be marginally visible in the apex. No significant pneumothorax. Right lung is clear. Hilar and mediastinal contours are unremarkable and unchanged. IMPRESSION: Air-fluid level within the left cavitary lesion. No significant pneumothorax. Trace pneumothorax may be visible in the left apex, also seen on CT. Electronically Signed   By: Andreas Newport M.D.   On: 12/22/2016 22:02   Dg Abd Portable 1v  Result Date: 12/23/2016 CLINICAL DATA:  Nasogastric tube placement.  Initial encounter. EXAM: PORTABLE ABDOMEN - 1 VIEW COMPARISON:  CT of the abdomen pelvis performed 12/22/2016 FINDINGS: The patient's enteric tube is noted ending overlying the body of the stomach. Air and contrast filled dilated loops of colon are again noted, likely reflecting ileus. There is no definite evidence for bowel obstruction. No free intra-abdominal air is seen, though evaluation for  free air is noted on a single supine view. No acute osseous abnormalities are seen. The patient's left-sided cavitary lung lesion is partially imaged. IMPRESSION: 1. Enteric tube noted ending overlying the body of the stomach. 2. Air and contrast filled dilated loops of colon again noted, likely reflecting ileus. No free intra-abdominal air seen. Electronically Signed   By: Garald Balding M.D.   On: 12/23/2016 00:43   Dg Swallowing Func-speech Pathology  Result Date: 12/22/2016 Objective Swallowing Evaluation: Type of Study: MBS-Modified Barium Swallow Study Patient Details Name: [name] MRN: [id number] Date of Birth: [birth year] Today's Date: 12/22/2016 Time: SLP Start Time (ACUTE ONLY): 0951-SLP Stop Time (ACUTE ONLY): 1012 SLP Time Calculation (min) (ACUTE ONLY): 21 min Past Medical History: Past Medical History: Diagnosis Date . Allergic rhinitis  . Arthritis  . Asthma  . Back pain  . Bipolar 1 disorder ([place])  . Chronic low back pain 03/06/2014 . COPD (chronic obstructive pulmonary disease) ([place])  . Depression  . Fibroid  . Hypertension  . IBS (irritable bowel syndrome)  . Migraine  . Scoliosis  Past Surgical History: Past Surgical History: Procedure Laterality Date . CHOLECYSTECTOMY   . DILATION AND CURETTAGE OF UTERUS   . EXPLORATORY LAPAROTOMY    WHEN PATIENT WAS YOUNG . RIGHT FOOT SURGERY   . TUBAL LIGATION Bilateral  HPI: 51 y/o F, 45 pk year smoker, with Hx of COPD/asthma and bipolar disorder admitted 1/19 with SOB. Working diagnosis of acute asthma exacerbation, multilobar PNA with resultant hypoxic respiratory failure. Intubated 12/12/16 to 12/20/16.  Subjective: alert for evaluation, requires cues to maintain alertness Assessment / Plan / Recommendation CHL IP CLINICAL IMPRESSIONS 12/22/2016 Therapy Diagnosis Mild pharyngeal phase dysphagia;Moderate pharyngeal phase dysphagia  Clinical Impression Ms. Huster was cooperative but drowsy during the MBS and required moderate cueing to attend to the  assessment and POs. Pt exhibits mild-moderate pharyngeal dysphagia characterized by delayed swallow initiation that results in silent penetration (able to clear with 2nd reflexive swallow) during nectar thick  consistencies. Thicker textures were better contained in the valleculae before the swallow. Backflow from upper esophageal segment to pharynx also noted with nectar thick liquids. Due to prolonged intubation, fluctuating mentation/alertness, and hx of CVA, recommend diet of honey thick liquids, pureed solids, meds whole in puree, with full supervision during mealtimes, small bites/sips, seated upright. ST will f/u with pt for treatment to assess safety/efficiency of swallowing function and possible diet upgrade.  Impact on safety and function Moderate aspiration risk   CHL IP TREATMENT RECOMMENDATION 12/22/2016 Treatment Recommendations Therapy as outlined in treatment plan below   Prognosis 12/22/2016 Prognosis for Safe Diet Advancement Good Barriers to Reach Goals -- Barriers/Prognosis Comment -- CHL IP DIET RECOMMENDATION 12/22/2016 SLP Diet Recommendations Dysphagia 1 (Puree) solids;Honey thick liquids Liquid Administration via Cup;No straw Medication Administration Whole meds with puree Compensations Slow rate;Small sips/bites Postural Changes Seated upright at 90 degrees;Remain semi-upright after after feeds/meals (Comment)   CHL IP OTHER RECOMMENDATIONS 12/22/2016 Recommended Consults -- Oral Care Recommendations Oral care BID Other Recommendations Order thickener from pharmacy;Prohibited food (jello, ice cream, thin soups);Remove water pitcher   CHL IP FOLLOW UP RECOMMENDATIONS 12/22/2016 Follow up Recommendations Skilled Nursing facility   [facility] IP FREQUENCY AND DURATION 12/22/2016 Speech Therapy Frequency (ACUTE ONLY) min 2x/week Treatment Duration 2 weeks      CHL IP ORAL PHASE 12/22/2016 Oral Phase WFL Oral - Pudding Teaspoon -- Oral - Pudding Cup -- Oral - Honey Teaspoon -- Oral - Honey Cup -- Oral - Nectar  Teaspoon -- Oral - Nectar Cup -- Oral - Nectar Straw -- Oral - Thin Teaspoon -- Oral - Thin Cup -- Oral - Thin Straw -- Oral - Puree -- Oral - Mech Soft -- Oral - Regular -- Oral - Multi-Consistency -- Oral - Pill -- Oral Phase - Comment --  CHL IP PHARYNGEAL PHASE 12/22/2016 Pharyngeal Phase Impaired Pharyngeal- Pudding Teaspoon -- Pharyngeal -- Pharyngeal- Pudding Cup -- Pharyngeal -- Pharyngeal- Honey Teaspoon -- Pharyngeal -- Pharyngeal- Honey Cup Delayed swallow initiation-vallecula;Other (Comment) Pharyngeal -- Pharyngeal- Nectar Teaspoon Delayed swallow initiation-pyriform sinuses;Penetration/Aspiration before swallow;Other (Comment) Pharyngeal Material enters airway, remains ABOVE vocal cords then ejected out Pharyngeal- Nectar Cup Delayed swallow initiation-pyriform sinuses;Other (Comment);Penetration/Aspiration before swallow Pharyngeal Material enters airway, remains ABOVE vocal cords then ejected out Pharyngeal- Nectar Straw -- Pharyngeal -- Pharyngeal- Thin Teaspoon -- Pharyngeal -- Pharyngeal- Thin Cup -- Pharyngeal -- Pharyngeal- Thin Straw -- Pharyngeal -- Pharyngeal- Puree Delayed swallow initiation-vallecula Pharyngeal -- Pharyngeal- Mechanical Soft -- Pharyngeal -- Pharyngeal- Regular -- Pharyngeal -- Pharyngeal- Multi-consistency -- Pharyngeal -- Pharyngeal- Pill -- Pharyngeal -- Pharyngeal Comment --  CHL IP CERVICAL ESOPHAGEAL PHASE 12/22/2016 Cervical Esophageal Phase Impaired Pudding Teaspoon -- Pudding Cup -- Honey Teaspoon -- Honey Cup -- Nectar Teaspoon -- Nectar Cup Esophageal backflow into the pharynx Nectar Straw -- Thin Teaspoon -- Thin Cup -- Thin Straw -- Puree -- Mechanical Soft -- Regular -- Multi-consistency -- Pill -- Cervical Esophageal Comment -- No flowsheet data found. Germain Osgood 12/22/2016, 11:25 AM  Note populated for Fransisca Kaufmann, student SLP Germain Osgood, M.A. CCC-SLP [phone]               Medications:  Scheduled: .  stroke: mapping our early stages  of recovery book   Does not apply Once  . ampicillin-sulbactam (UNASYN) IV  3 g Intravenous Q6H  . aspirin  300 mg Rectal Daily   Or  . aspirin  325 mg Per Tube Daily  . budesonide (PULMICORT) nebulizer solution  0.25 mg Nebulization BID  .  chlorhexidine gluconate (MEDLINE KIT)  15 mL Mouth Rinse BID  . heparin subcutaneous  5,000 Units Subcutaneous Q8H  . insulin aspart  0-9 Units Subcutaneous Q4H  . ipratropium-albuterol  3 mL Nebulization Q6H  . levalbuterol  1.25 mg Nebulization Once  . mouth rinse  15 mL Mouth Rinse QID  . metoprolol  5 mg Intravenous Q6H  . metronidazole  500 mg Intravenous Q8H  . vancomycin (VANCOCIN) rectal ENEMA  500 mg Rectal Q6H   Continuous: . dextrose 5 % with kcl 100 mL/hr at 12/23/16 0138   KZL:DJTTSV chloride, albuterol, hydrALAZINE, ketorolac, lidocaine, RESOURCE THICKENUP CLEAR, white petrolatum  Assessment/Plan:  Active Problems:   Pneumonia   Pressure injury of skin   Acute kidney injury ([place])   HCAP (healthcare-associated pneumonia)   Transaminitis   Ileus (HCC)   Pneumothorax   Pneumonia of left upper lobe due to Streptococcus pneumoniae ([facility])    Acute diarrhea/possible C. difficile/ileus CT scan of the abdomen and pelvis showed colonic ileus. No obstruction was noted. Patient continues to have diarrhea. C. difficile is equivocal. However, she is being treated for the same. Last night, patient became agitated and hypoxic. She was seen by critical care medicine. NG tube was inserted. She was placed on vancomycin enemas and intravenous metronidazole. This seemed to have helped. Unfortunately, she pulled her NG out again this morning. Discussed with the daughter who will come and and try to convince the patient to keep it in. Abdominal x-rays to be repeated. General surgery has been consulted for the ileus. WBC remains elevated. Leave nothing by mouth for now. Check lactic acid.  Acute hypoxic respiratory failure secondary to Multilobar PNA  with small pneumothorax Patient had been improving. She is saturating normal on room air. CT scan incidentally showed a pneumothorax. CT chest was done which confirmed a small pneumothorax. Possibly related to the left-sided lung cavity. Pulmonology is following.   Multilobar pneumonia with strep pneumonia with cavitary lesion Cavitary lesion noted in the lung. Cultures from tracheal aspirate were positive for strep pneumo. Per pulmonology she needs Unasyn for a minimum of 3 weeks. CT scan to be done around 2-3 weeks from now to monitor cavitary lesion. HIV nonreactive.  Demand ischemia with history of grade 2 diastolic dysfunction Troponin was elevated up to 1.25. Subsequently started trending down. Patient is on a beta blocker. Echocardiogram report as above. No wall motion abnormalities were noted. May benefit from outpatient cardiology evaluation.  AKI Resolved. Creatinine was elevated up to 3.46. She was given IV fluids with improvement.  Acute asthma exacerbation Appears to be resolving. She was on steroids. Now off of it.  Hypokalemia This was repleted aggressively. Potassium is now normal.  Hypernatremia Sodium level is stable. Continue to monitor.  Ischemic hepatitis At admission AST was 2024 and ALT was 1366. Most likely due to shock liver. Hepatitis panel negative. LFTs have improved.  Dysphagia Speech therapy is following. She underwent modified barium swallow on 1/29 and was cleared for dysphagia 1 diet. However, currently she is nothing by mouth due to ileus.  Leukocytosis WBC remains elevated. Continue to treat infections.  Acute Anemia Hgb 10.4 on admission. Baseline around 12. Monitor. No overt bleeding has been noted.  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  Acute encephalopathy Remains confused. Distracted. Continue to monitor for now.   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 Patient seen by neurology. Stroke thought to be secondary to  acute illness. Aspirin recommended. Secondary risk reduction.   Bipolar  d/o Continue seroquel, lexapro, and lamotrigine  DVT Prophylaxis: Subcutaneous heparin   Code Status: Full code  Family Communication: Discussed with the patient's daughter  Disposition Plan: Patient remains quite ill. Should remain in step down status for now. Await specialty input.    LOS: 11 days   [facility] Pager [phone] 12/23/2016, 8:04 AM  If 7PM-7AM, please contact night-coverage at [email or web address], password [facility]

## 2016-12-23 NOTE — Progress Notes (Signed)
MD paged, Patient pulled out NGT, not allowing RN to replace tube. Patient keeps yelling "NO".  RN obtaining order for bilateral  wrist retraints

## 2016-12-23 NOTE — Evaluation (Signed)
Occupational Therapy Evaluation Patient Details Name: [name] MRN: [id number] DOB: 28-Mar-1966 Today's Date: 12/23/2016    History of Present Illness 51 y/o F, 67 pk year smoker, with Hx of COPD/asthma and bipolar disorder admitted 1/19 with SOB.  Working diagnosis of acute asthma exacerbation, multilobar PNA with resultant hypoxic respiratory failure.  Intubated 12/12/16 to 12/20/16.  Extubated 1/28   Clinical Impression   Per chart review, pt is independent at baseline. She presents today with lethargy and limited toleration of activity. She requires 2 person assist for bed level mobility and sat EOB with stable VS x 10 minutes. Pt refused attempt to stand. Pt with impaired cognition and removed NGT prior to session. Will follow acutely. Anticipate pt will need SNF upon discharge.    Follow Up Recommendations  SNF;Supervision/Assistance - 24 hour    Equipment Recommendations  Other (comment) (defer to next venue)    Recommendations for Other Services       Precautions / Restrictions Precautions Precautions: Fall Precaution Comments: pulled out NGT Restrictions Weight Bearing Restrictions: No      Mobility Bed Mobility Overal bed mobility: Needs Assistance Bed Mobility: Supine to Sit;Sit to Supine     Supine to sit: +2 for physical assistance;Mod assist Sit to supine: +2 for physical assistance;Mod assist   General bed mobility comments: pt participating, but requiring assist to initiate, raise trunk and advance hips to EOB with supine to sit, assisted to guide trunk and raise LEs back into bed, +2 total to pull up in bed with pad  Transfers Overall transfer level: Needs assistance               General transfer comment: attempted x 1, but pt not participating    Balance Overall balance assessment: Needs assistance Sitting-balance support: Feet supported Sitting balance-Leahy Scale: Poor Sitting balance - Comments: min guard to min assist for static  sitting balance                                    ADL Overall ADL's : Needs assistance/impaired Eating/Feeding: NPO   Grooming: Brushing hair;Sitting;Total assistance Grooming Details (indicate cue type and reason): declined mouth swab/oral care             Lower Body Dressing: Total assistance;Bed level                       Vision Additional Comments: unable to assess due to cognition and keeping eyes closed much of session   Perception     Praxis      Pertinent Vitals/Pain Pain Assessment: Faces Faces Pain Scale: No hurt     Hand Dominance Right   Extremity/Trunk Assessment Upper Extremity Assessment Upper Extremity Assessment: Generalized weakness   Lower Extremity Assessment Lower Extremity Assessment: Defer to PT evaluation       Communication Communication Communication: No difficulties   Cognition Arousal/Alertness: Lethargic Behavior During Therapy: Flat affect;Restless Overall Cognitive Status: Impaired/Different from baseline Area of Impairment: Safety/judgement;Problem solving;Attention   Current Attention Level: Sustained     Safety/Judgement: Decreased awareness of safety;Decreased awareness of deficits   Problem Solving: Slow processing General Comments: Pt has been pulling at lines and in UE restraints upon arrival.   General Comments       Exercises       Shoulder Instructions      Home Living Family/patient expects to be discharged to::  Private residence Living Arrangements: Alone Available Help at Discharge: Friend(s);Available PRN/intermittently Type of Home: House Home Access: Stairs to enter [employer] of Steps: 3   Home Layout: One level     Bathroom Shower/Tub: Teacher, early years/pre: Standard     Home Equipment: None   Additional Comments: home set up obtained via chart review from previous admissions      Prior Functioning/Environment Level of Independence:  Independent                 OT Problem List: Decreased strength;Decreased activity tolerance;Impaired balance (sitting and/or standing);Decreased cognition;Decreased safety awareness;Decreased knowledge of use of DME or AE;Cardiopulmonary status limiting activity   OT Treatment/Interventions: Self-care/ADL training;DME and/or AE instruction;Patient/family education;Balance training;Therapeutic activities;Therapeutic exercise;Cognitive remediation/compensation    OT Goals(Current goals can be found in the care plan section) Acute Rehab OT Goals Patient Stated Goal: none stated OT Goal Formulation: Patient unable to participate in goal setting Time For Goal Achievement: 01/06/17 Potential to Achieve Goals: Fair ADL Goals Pt Will Perform Grooming: with mod assist;sitting Pt Will Perform Upper Body Dressing: with mod assist;sitting Pt Will Transfer to Toilet: with +2 assist;with min assist;bedside commode;stand pivot transfer Pt/caregiver will Perform Home Exercise Program: Increased strength;Both right and left upper extremity (AROM B UEs) Additional ADL Goal #1: Pt will tolerate sitting EOB x 15 minutes in preparation for ADL. Additional ADL Goal #2: Pt will be oriented to time and place with environmental cues.  OT Frequency: Min 2X/week   Barriers to D/C:            Co-evaluation PT/OT/SLP Co-Evaluation/Treatment: Yes Reason for Co-Treatment: For patient/therapist safety   OT goals addressed during session: Strengthening/ROM      End of Session Equipment Utilized During Treatment: Oxygen Nurse Communication:  (ok to swab mouth with cold water)  Activity Tolerance: Patient limited by lethargy;Patient limited by fatigue Patient left: in bed;with call bell/phone within reach;with bed alarm set;with restraints reapplied   Time: 1235-1255 OT Time Calculation (min): 20 min Charges:  OT General Charges $OT Visit: 1 Procedure OT Evaluation $OT Eval Moderate Complexity: 1  Procedure G-Codes:    Malka So 12/23/2016, 2:19 PM  [phone]

## 2016-12-23 NOTE — Progress Notes (Addendum)
RN paged MD for daughter, daughter is at bedside. Daughter would like update. MD called room phone.  Patient agreeing to let RN try to re-insert NGT. RN will attempt.

## 2016-12-23 NOTE — Progress Notes (Addendum)
Physical Therapy Treatment Patient Details Name: [name] MRN: [id number] DOB: 09-26-1966 Today's Date: 12/23/2016    History of Present Illness 51 y/o F, 23 pk year smoker, with Hx of COPD/asthma and bipolar disorder admitted 1/19 with SOB.  Working diagnosis of acute asthma exacerbation, multilobar PNA with resultant hypoxic respiratory failure.  Intubated 12/12/16 to 12/20/16.  Extubated 1/28. Pt transferred to step down on 1/29 with ileus. NG placed which pt pulled out.    PT Comments    Pt making slow progress.   Follow Up Recommendations  SNF     Equipment Recommendations  Other (comment) (To be assessed)    Recommendations for Other Services       Precautions / Restrictions Precautions Precautions: Fall Precaution Comments: pulled out NGT Restrictions Weight Bearing Restrictions: No    Mobility  Bed Mobility Overal bed mobility: Needs Assistance Bed Mobility: Supine to Sit;Sit to Supine     Supine to sit: +2 for physical assistance;Mod assist Sit to supine: +2 for physical assistance;Mod assist   General bed mobility comments: pt participating, but requiring assist to initiate, raise trunk and advance hips to EOB with supine to sit, assisted to guide trunk and raise LEs back into bed, +2 total to pull up in bed with pad  Transfers Overall transfer level: Needs assistance               General transfer comment: attempted x 1, but pt not participating  Ambulation/Gait                 Stairs            Wheelchair Mobility    Modified Rankin (Stroke Patients Only)       Balance Overall balance assessment: Needs assistance Sitting-balance support: Feet supported Sitting balance-Leahy Scale: Poor Sitting balance - Comments: min guard to min assist for static sitting balance. Pt sat EOB x 10 minutes. Pt with VSS but dyspnea 3/4 on O2.                             Cognition Arousal/Alertness: Lethargic Behavior During  Therapy: Flat affect;Restless Overall Cognitive Status: Impaired/Different from baseline Area of Impairment: Safety/judgement;Problem solving;Attention   Current Attention Level: Sustained     Safety/Judgement: Decreased awareness of safety;Decreased awareness of deficits   Problem Solving: Slow processing General Comments: Pt has been pulling at lines and in UE restraints upon arrival. Pt irritable and answering "don't know" when asked what she needed. Also stated she was getting ready to go to store to get something to drink.    Exercises      General Comments        Pertinent Vitals/Pain Pain Assessment: Faces Faces Pain Scale: No hurt    Home Living Family/patient expects to be discharged to:: Private residence Living Arrangements: Alone Available Help at Discharge: Friend(s);Available PRN/intermittently Type of Home: House Home Access: Stairs to enter   Home Layout: One level Home Equipment: None Additional Comments: home set up obtained via chart review from previous admissions    Prior Function Level of Independence: Independent          PT Goals (current goals can now be found in the care plan section) Acute Rehab PT Goals Patient Stated Goal: none stated Progress towards PT goals: Progressing toward goals    Frequency    Min 3X/week      PT Plan Current plan remains appropriate  Co-evaluation PT/OT/SLP Co-Evaluation/Treatment: Yes Reason for Co-Treatment: For patient/therapist safety PT goals addressed during session: Mobility/safety with mobility;Balance OT goals addressed during session: Strengthening/ROM     End of Session Equipment Utilized During Treatment: Oxygen Activity Tolerance: Patient limited by fatigue Patient left: in bed;with call bell/phone within reach;with bed alarm set     Time: [id number] PT Time Calculation (min) (ACUTE ONLY): 19 min  Charges:  $Therapeutic Activity: 8-22 mins                    G CodesShary [facility] 2016-12-25, 3:25 PM [employer] PT [phone]

## 2016-12-23 NOTE — Progress Notes (Signed)
Pt has had a status change from initial baseline when I came in at 1900. I passed the room and found her to be moaning and rocking in pain and is very tachypnic in the 30s-40s. Breathing labored. Pain localized to distended abdomen with tenderness and guarding noted. Received breathing tx per respiratory therapy while I gave her morphine see MAR. Lungs crackly with profound wheezing. On call NP schorr notified, received order for oxycodone which I gave. Rapid Response RN Nevin Bloodgood notified and she came to bedside. Pt placed on telemetry and continuous pulse ox. See detailed rapid response RN's note for progression. There was no family present at bedside initially but then the daughter [name] came in about 40 minutes into the workup and was very upset she had not been notified. I had been unaware that there was family present until she came in and had been too involved in acute situation to look up family contacts yet. Pt evaluated at bedside by Schorr Np and critical care MD's and transferred to 4east stepdown. Report given to [facility].

## 2016-12-23 NOTE — Progress Notes (Signed)
Shift event note: Notified by RN regarding increased WOB and increased 02 demand. Initially felt to be somewhat pain driven as pt has had c/o abd pain but continued after medicated for pain. RR RN was paged and responded to bedside. CXR was ordered by RR RN. At bedside pt noted very tachypnic w/ RR in the upper 30's to 40's. BBS very diminished w/ expiratory wheezes in upper airways. Significant use of accessory muscles and abd breathing noted. Pt was on r/a earlier today and now requiring 4-5L 02 via Gerty. On 5L 02 sats 94%. Pt is afebrile. BP 136/90. Though a poor historian continues to report abd pain. Abd noted rather distended, somewhat firm w/ active bs and generalized TTP. CXR reveals no new findings. Assessment/Plan: 1. Acute hypoxic respiratory failure: Felt related to multilobar strep pneumonia and cavitary lesion,  though etiology for current (acute) increased 02 demand unclear. Likely multifactorial given PNA. h/o asthma and COPD and exacerbated by ongoing abd pain.  Pt is on Unasyn. Discussed pt w/ Dr Vaughan Browner w/ [facility] who has agreed to have pt evaluated by [facility] provider tonight.  2. Abd pain: Ct scan findings today c/w ileus w/o evidence of obstruction (and "tiny" (L) anterior pneumothorax)  but no other acute abd process. On Flagyl and oral vanc for C-Diff treatment. Continues to have dark liquid stool via flexiseal.  Pt made NPO for now.   P.Heber Clitherall, NP w/ PCCM at bedside to evaluate pt. Will defer plan to PCCM for now.   Jeryl Columbia, NP-C [facility] Pager [phone]  Addedum: Notified by P. Hoffman, NP just after 2300 that pt will be transferred to SDU for close observation. PCCM will remain consulting service for now.

## 2016-12-23 NOTE — Progress Notes (Signed)
Pt transferred from [place] to [place] after report.  Pt arrives with NG to low intermittent suction.  A&Ox4.  Sinus tachy.  Pt oriented to new surroundings with call bell within reach.  Will continue to monitor and update team and daughter as appropriate.

## 2016-12-23 NOTE — Consult Note (Signed)
[facility] Consult/Admission Note  [name] 1966/03/15  [id number].    Requesting MD: Dr. [place] Pink Chief Complaint/Reason for Consult: Ileus  HPI:   51 year old woman with 107 pack year smoking history, asthma, allergic rhinitis, COPD, HTN, depression, Bipolar d/o, chronic low back pain, HTN, migraines, scoliosis who presented to the [facility] ED with dyspnea and was found to have acute hypoxic respiratory failure and multilobar pneumonia on CXR. History was obtained from daughter and EMR due to acute encephalopathy. Pt was feeling poorly for 2-3 days with fatigue, malaise, lethargy, subjective fevers, chills, myalgias, confusion. She had a cough productive of yellow green sputum and dyspnea. She had lower midsternal chest pain/epigastric pain. She had nausea but no vomiting. No diarrhea, dysuria or hematuria. Possible focal weakness with not lifting her L arm.  ED Course: Intubated, started on ceftriaxone and azithromycin for suspected pneumonia. CT head Left occipital lobe probable acute or early subacute infarction. No acute intracranial hemorrhage.    Since admission pt has been having abdominal pain and loose stools. Pt was found to be positive for C. diff with toxin negative. Blood cultures negative to date. Respiratory cultures + for Strep pneumo. Pt was extubated on 1/27. Pt developed diarrhea and ileus. NGT placed 1/29. Pt was found to have dilated loops of colon suggesting ileus on abd films and CT. Abd xray today showed some improvement in diffuse gaseous distention of bowel.   Subjective: Pt states she does not feel good. Pt is complaining of left sided flank/abdominal pain that is nonradiating, constant, nothing makes it better or worse. Pt unable to tell me how long she has had this pain. Pt denies nausea or vomiting. Pt pulled out her NGT this morning and is refusing another one. After a long discussion of why it is important the pt still is refusing the NGT because it  hurts her throat. Pt states she is feeling hot and sweating. She denies other symptoms. She is requesting soda to drink.   PAST SURGICAL HISTORY: Cholecystectomy; Tubal ligation (Bilateral); Dilation and curettage of uterus; right foot surgery; and Exploratory laparotomy.  ROS:  Review of Systems  Constitutional: Positive for diaphoresis. Negative for chills and fever.  HENT: Positive for sore throat.   Respiratory: Positive for cough.   Gastrointestinal: Positive for abdominal pain. Negative for nausea and vomiting.  Unable to fully assess ROS due to confusion   Family History  Problem Relation Age of Onset  . Stroke Mother   . Hypertension Brother   . Hypertension Brother   . Hypertension Brother   . Allergic rhinitis Neg Hx   . Angioedema Neg Hx   . Asthma Neg Hx   . Atopy Neg Hx   . Eczema Neg Hx   . Immunodeficiency Neg Hx   . Urticaria Neg Hx     Past Medical History:  Diagnosis Date  . Allergic rhinitis   . Arthritis   . Asthma   . Back pain   . Bipolar 1 disorder ([place])   . Chronic low back pain 03/06/2014  . COPD (chronic obstructive pulmonary disease) ([place])   . Depression   . Fibroid   . Hypertension   . IBS (irritable bowel syndrome)   . Migraine   . Scoliosis     Past Surgical History:  Procedure Laterality Date  . CHOLECYSTECTOMY    . DILATION AND CURETTAGE OF UTERUS    . EXPLORATORY LAPAROTOMY     WHEN PATIENT WAS YOUNG  . RIGHT FOOT SURGERY    .  TUBAL LIGATION Bilateral     Social History:  reports that she has been smoking Cigarettes.  She has been smoking about 0.50 packs per day. She has never used smokeless tobacco. She reports that she does not drink alcohol or use drugs.  Allergies:  Allergies  Allergen Reactions  . Cymbalta [Duloxetine Hcl] Other (See Comments)    Mood changes...mean  . Mucinex [Guaifenesin Er] Other (See Comments)    Possible allergy, per patient (??)  . Nyquil Multi-Symptom [Pseudoeph-Doxylamine-Dm-Apap] Hives   . Paxil [Paroxetine Hcl] Other (See Comments)    Mean and aggresive  . Promethazine Itching  . Tramadol Itching  . Wellbutrin [Bupropion] Other (See Comments)    Rapid heartrate  . Zoloft [Sertraline] Other (See Comments)    Anger    Facility-Administered Medications Prior to Admission  Medication Dose Route Frequency Provider Last Rate Last Dose  . Mepolizumab SOLR 100 mg  100 mg Subcutaneous Q28 days Valentina Shaggy, MD   100 mg at 12/01/16 0347   Medications Prior to Admission  Medication Sig Dispense Refill  . acetaminophen (TYLENOL) 325 MG tablet Take 325-650 mg by mouth every 6 (six) hours as needed for headache (or pain).    Marland Kitchen albuterol (PROAIR HFA) 108 (90 Base) MCG/ACT inhaler Inhale 2 puffs into the lungs every 6 (six) hours as needed for wheezing or shortness of breath. (Patient taking differently: Inhale 2 puffs into the lungs 3 (three) times daily. ) 8.5 Inhaler 5  . albuterol (PROVENTIL) (2.5 MG/3ML) 0.083% nebulizer solution Take 3 mLs (2.5 mg total) by nebulization every 6 (six) hours as needed for wheezing or shortness of breath. 75 mL 1  . Azelastine HCl 0.15 % SOLN Place 1 spray into both nostrils 2 (two) times daily. 30 mL 5  . beclomethasone (QVAR) 40 MCG/ACT inhaler Inhale 4 puffs into the lungs daily at 12 noon. 1 Inhaler 3  . benzonatate (TESSALON PERLES) 100 MG capsule Take 1 capsule (100 mg total) by mouth 3 (three) times daily as needed for cough. 20 capsule 0  . calcium carbonate (TUMS - DOSED IN MG ELEMENTAL CALCIUM) 500 MG chewable tablet Chew 1 tablet (200 mg of elemental calcium total) by mouth 2 (two) times daily. (Patient not taking: Reported on 12/01/2016)    . clonazePAM (KLONOPIN) 1 MG tablet Take 1 mg by mouth daily as needed for anxiety.     . ergocalciferol (VITAMIN D2) 50000 units capsule Take 50,000 Units by mouth once a week.     . escitalopram (LEXAPRO) 20 MG tablet Take 40 mg by mouth daily.     Marland Kitchen gabapentin (NEURONTIN) 400 MG capsule Take  800 mg by mouth 3 (three) times daily.     . hydrOXYzine (VISTARIL) 25 MG capsule Take 25 mg by mouth 3 (three) times daily as needed for anxiety.  2  . ipratropium (ATROVENT) 0.03 % nasal spray 1-2 sprays every 6 hours as needed (Patient taking differently: Place 1-2 sprays into both nostrils every 6 (six) hours as needed for rhinitis. ) 30 mL 5  . lamoTRIgine (LAMICTAL) 100 MG tablet Take 100 mg by mouth 2 (two) times daily.    Marland Kitchen levocetirizine (XYZAL) 5 MG tablet Take 1 tablet (5 mg total) by mouth every evening. 30 tablet 5  . loratadine (CLARITIN) 10 MG tablet Take 1 tablet (10 mg total) by mouth every morning. 30 tablet 5  . meloxicam (MOBIC) 15 MG tablet Take 15 mg by mouth daily.    . methocarbamol (ROBAXIN)  750 MG tablet Take 750 mg by mouth 3 (three) times daily.     . metoprolol tartrate (LOPRESSOR) 25 MG tablet Take 25 mg by mouth 2 (two) times daily. Reported on 02/21/2016    . mometasone-formoterol (DULERA) 200-5 MCG/ACT AERO Inhale 2 puffs into the lungs 2 (two) times daily. 1 Inhaler 4  . ondansetron (ZOFRAN) 4 MG tablet Take 4 mg by mouth every 8 (eight) hours as needed for nausea or vomiting.    Marland Kitchen oxyCODONE (ROXICODONE) 15 MG immediate release tablet Take 15 mg by mouth 3 (three) times daily.     . QUEtiapine (SEROQUEL) 300 MG tablet Take 300 mg by mouth at bedtime.    . ranitidine (ZANTAC) 300 MG tablet Take 300 mg by mouth 2 (two) times daily.  3  . rosuvastatin (CRESTOR) 10 MG tablet Take 10 mg by mouth daily. Reported on 05/14/2016    . Spacer/Aero-Holding Chambers (AEROCHAMBER PLUS FLO-VU MEDIUM) MISC Use with metered dose inhaler 1 each 1  . tiotropium (SPIRIVA HANDIHALER) 18 MCG inhalation capsule Place 1 capsule (18 mcg total) into inhaler and inhale daily. 30 capsule 3    Blood pressure (!) 153/92, pulse (!) 103, temperature 100.3 F (37.9 C), temperature source Axillary, resp. rate (!) 21, height 5' 4"  (1.626 m), weight 162 lb 14.7 oz (73.9 kg), SpO2 97 %.  Physical  Exam  Constitutional: She is well-developed, well-nourished, and in no distress. Vital signs are normal. No distress.  HENT:  Head: Normocephalic and atraumatic.  Nose: Nose normal.  Lips dry with scaling   Eyes: Conjunctivae are normal. Right eye exhibits no discharge. Left eye exhibits no discharge. No scleral icterus.  Neck: Normal range of motion. Neck supple.  Cardiovascular: Regular rhythm, normal heart sounds and intact distal pulses.  Tachycardia present.  Exam reveals no gallop and no friction rub.   No murmur heard. Pulses:      Radial pulses are 2+ on the right side, and 2+ on the left side.       Dorsalis pedis pulses are 2+ on the right side, and 2+ on the left side.  Pulmonary/Chest: Effort normal. Tachypnea noted. No respiratory distress. She has no decreased breath sounds. She has rhonchi.  Abdominal: Soft. Bowel sounds are normal. She exhibits distension. She exhibits no mass. There is generalized tenderness. There is no rebound and no guarding.  Tympanic and moderate generalized TTP worse on the left side  Musculoskeletal: Normal range of motion. She exhibits no edema or deformity.  Neurological: She is alert. GCS score is 15.  Skin: Skin is warm. No rash noted. She is diaphoretic.  Psychiatric:  Appears confused  Nursing note and vitals reviewed.   Results for orders placed or performed during the hospital encounter of 12/12/16 (from the past 48 hour(s))  Glucose, capillary     Status: Abnormal   Collection Time: 12/21/16 12:14 PM  Result Value Ref Range   Glucose-Capillary 127 (H) 65 - 99 mg/dL   Comment 1 Notify RN   Glucose, capillary     Status: Abnormal   Collection Time: 12/21/16  5:08 PM  Result Value Ref Range   Glucose-Capillary 130 (H) 65 - 99 mg/dL   Comment 1 Notify RN    Comment 2 Document in Chart   C difficile quick scan w PCR reflex     Status: Abnormal   Collection Time: 12/21/16  6:25 PM  Result Value Ref Range   C Diff antigen POSITIVE (A)  NEGATIVE  C Diff toxin NEGATIVE NEGATIVE   C Diff interpretation Results are indeterminate. See PCR results.   Clostridium Difficile by PCR     Status: Abnormal   Collection Time: 12/21/16  6:25 PM  Result Value Ref Range   Toxigenic C Difficile by pcr POSITIVE (A) NEGATIVE    Comment: Positive for toxigenic C. difficile with little to no toxin production. Only treat if clinical presentation suggests symptomatic illness.  Glucose, capillary     Status: Abnormal   Collection Time: 12/21/16  7:23 PM  Result Value Ref Range   Glucose-Capillary 115 (H) 65 - 99 mg/dL  BMET today at 2000     Status: Abnormal   Collection Time: 12/21/16  7:47 PM  Result Value Ref Range   Sodium 149 (H) 135 - 145 mmol/L   Potassium 3.9 3.5 - 5.1 mmol/L    Comment: DELTA CHECK NOTED   Chloride 111 101 - 111 mmol/L   CO2 25 22 - 32 mmol/L   Glucose, Bld 120 (H) 65 - 99 mg/dL   BUN 14 6 - 20 mg/dL   Creatinine, Ser 0.54 0.44 - 1.00 mg/dL   Calcium 9.2 8.9 - 10.3 mg/dL   GFR calc non Af Amer >60 >60 mL/min   GFR calc Af Amer >60 >60 mL/min    Comment: (NOTE) The eGFR has been calculated using the CKD EPI equation. This calculation has not been validated in all clinical situations. eGFR's persistently <60 mL/min signify possible Chronic Kidney Disease.    Anion gap 13 5 - 15  Glucose, capillary     Status: Abnormal   Collection Time: 12/22/16 12:03 AM  Result Value Ref Range   Glucose-Capillary 114 (H) 65 - 99 mg/dL  Glucose, capillary     Status: Abnormal   Collection Time: 12/22/16  4:10 AM  Result Value Ref Range   Glucose-Capillary 125 (H) 65 - 99 mg/dL  BMET in AM     Status: Abnormal   Collection Time: 12/22/16  5:01 AM  Result Value Ref Range   Sodium 147 (H) 135 - 145 mmol/L   Potassium 5.1 3.5 - 5.1 mmol/L    Comment: DELTA CHECK NOTED HEMOLYSIS AT THIS LEVEL MAY AFFECT RESULT    Chloride 113 (H) 101 - 111 mmol/L   CO2 23 22 - 32 mmol/L   Glucose, Bld 100 (H) 65 - 99 mg/dL   BUN 15 6  - 20 mg/dL   Creatinine, Ser 0.60 0.44 - 1.00 mg/dL   Calcium 9.1 8.9 - 10.3 mg/dL   GFR calc non Af Amer >60 >60 mL/min   GFR calc Af Amer >60 >60 mL/min    Comment: (NOTE) The eGFR has been calculated using the CKD EPI equation. This calculation has not been validated in all clinical situations. eGFR's persistently <60 mL/min signify possible Chronic Kidney Disease.    Anion gap 11 5 - 15  CBC     Status: Abnormal   Collection Time: 12/22/16  5:01 AM  Result Value Ref Range   WBC 18.0 (H) 4.0 - 10.5 K/uL    Comment: WHITE COUNT CONFIRMED ON SMEAR   RBC 3.76 (L) 3.87 - 5.11 MIL/uL   Hemoglobin 11.1 (L) 12.0 - 15.0 g/dL   HCT 35.3 (L) 36.0 - 46.0 %   MCV 93.9 78.0 - 100.0 fL   MCH 29.5 26.0 - 34.0 pg   MCHC 31.4 30.0 - 36.0 g/dL   RDW 16.7 (H) 11.5 - 15.5 %   Platelets 453 (  H) 150 - 400 K/uL  Magnesium     Status: None   Collection Time: 12/22/16  5:01 AM  Result Value Ref Range   Magnesium 2.3 1.7 - 2.4 mg/dL  Phosphorus     Status: None   Collection Time: 12/22/16  5:01 AM  Result Value Ref Range   Phosphorus 3.3 2.5 - 4.6 mg/dL  Glucose, capillary     Status: Abnormal   Collection Time: 12/22/16  7:39 AM  Result Value Ref Range   Glucose-Capillary 109 (H) 65 - 99 mg/dL  Glucose, capillary     Status: Abnormal   Collection Time: 12/22/16 12:08 PM  Result Value Ref Range   Glucose-Capillary 111 (H) 65 - 99 mg/dL  Glucose, capillary     Status: Abnormal   Collection Time: 12/22/16  4:55 PM  Result Value Ref Range   Glucose-Capillary 135 (H) 65 - 99 mg/dL  Glucose, capillary     Status: Abnormal   Collection Time: 12/22/16  8:04 PM  Result Value Ref Range   Glucose-Capillary 105 (H) 65 - 99 mg/dL  Blood gas, arterial     Status: Abnormal   Collection Time: 12/22/16 11:15 PM  Result Value Ref Range   O2 Content 4.0 L/min   Delivery systems NASAL CANNULA    pH, Arterial 7.447 7.350 - 7.450   pCO2 arterial 37.3 32.0 - 48.0 mmHg   pO2, Arterial 71.7 (L) 83.0 - 108.0  mmHg   Bicarbonate 25.4 20.0 - 28.0 mmol/L   Acid-Base Excess 1.7 0.0 - 2.0 mmol/L   O2 Saturation 93.6 %   Patient temperature 98.6    Collection site RIGHT RADIAL    Drawn by [id number]    Sample type ARTERIAL DRAW    Allens test (pass/fail) PASS PASS  CBC     Status: Abnormal   Collection Time: 12/22/16 11:24 PM  Result Value Ref Range   WBC 24.3 (H) 4.0 - 10.5 K/uL   RBC 3.81 (L) 3.87 - 5.11 MIL/uL   Hemoglobin 11.0 (L) 12.0 - 15.0 g/dL   HCT 35.3 (L) 36.0 - 46.0 %   MCV 92.7 78.0 - 100.0 fL   MCH 28.9 26.0 - 34.0 pg   MCHC 31.2 30.0 - 36.0 g/dL   RDW 16.3 (H) 11.5 - 15.5 %   Platelets 532 (H) 150 - 400 K/uL  Comprehensive metabolic panel     Status: Abnormal   Collection Time: 12/22/16 11:24 PM  Result Value Ref Range   Sodium 143 135 - 145 mmol/L   Potassium 3.6 3.5 - 5.1 mmol/L    Comment: DELTA CHECK NOTED   Chloride 109 101 - 111 mmol/L   CO2 22 22 - 32 mmol/L   Glucose, Bld 109 (H) 65 - 99 mg/dL   BUN 12 6 - 20 mg/dL   Creatinine, Ser 0.55 0.44 - 1.00 mg/dL   Calcium 8.9 8.9 - 10.3 mg/dL   Total Protein 5.6 (L) 6.5 - 8.1 g/dL   Albumin 3.0 (L) 3.5 - 5.0 g/dL   AST 23 15 - 41 U/L   ALT 67 (H) 14 - 54 U/L   Alkaline Phosphatase 75 38 - 126 U/L   Total Bilirubin 0.7 0.3 - 1.2 mg/dL   GFR calc non Af Amer >60 >60 mL/min   GFR calc Af Amer >60 >60 mL/min    Comment: (NOTE) The eGFR has been calculated using the CKD EPI equation. This calculation has not been validated in all clinical situations. eGFR's persistently <60  mL/min signify possible Chronic Kidney Disease.    Anion gap 12 5 - 15  Glucose, capillary     Status: Abnormal   Collection Time: 12/23/16  1:48 AM  Result Value Ref Range   Glucose-Capillary 113 (H) 65 - 99 mg/dL  CBC     Status: Abnormal   Collection Time: 12/23/16  2:22 AM  Result Value Ref Range   WBC 25.2 (H) 4.0 - 10.5 K/uL   RBC 3.61 (L) 3.87 - 5.11 MIL/uL   Hemoglobin 10.4 (L) 12.0 - 15.0 g/dL   HCT 33.7 (L) 36.0 - 46.0 %   MCV 93.4  78.0 - 100.0 fL   MCH 28.8 26.0 - 34.0 pg   MCHC 30.9 30.0 - 36.0 g/dL   RDW 16.5 (H) 11.5 - 15.5 %   Platelets 551 (H) 150 - 400 K/uL  Comprehensive metabolic panel     Status: Abnormal   Collection Time: 12/23/16  2:22 AM  Result Value Ref Range   Sodium 145 135 - 145 mmol/L   Potassium 3.5 3.5 - 5.1 mmol/L   Chloride 109 101 - 111 mmol/L   CO2 25 22 - 32 mmol/L   Glucose, Bld 108 (H) 65 - 99 mg/dL   BUN 12 6 - 20 mg/dL   Creatinine, Ser 0.57 0.44 - 1.00 mg/dL   Calcium 8.9 8.9 - 10.3 mg/dL   Total Protein 5.3 (L) 6.5 - 8.1 g/dL   Albumin 2.7 (L) 3.5 - 5.0 g/dL   AST 26 15 - 41 U/L   ALT 63 (H) 14 - 54 U/L   Alkaline Phosphatase 71 38 - 126 U/L   Total Bilirubin 0.7 0.3 - 1.2 mg/dL   GFR calc non Af Amer >60 >60 mL/min   GFR calc Af Amer >60 >60 mL/min    Comment: (NOTE) The eGFR has been calculated using the CKD EPI equation. This calculation has not been validated in all clinical situations. eGFR's persistently <60 mL/min signify possible Chronic Kidney Disease.    Anion gap 11 5 - 15  Magnesium     Status: None   Collection Time: 12/23/16  2:22 AM  Result Value Ref Range   Magnesium 1.8 1.7 - 2.4 mg/dL  Glucose, capillary     Status: Abnormal   Collection Time: 12/23/16  3:59 AM  Result Value Ref Range   Glucose-Capillary 125 (H) 65 - 99 mg/dL  Glucose, capillary     Status: Abnormal   Collection Time: 12/23/16  7:20 AM  Result Value Ref Range   Glucose-Capillary 139 (H) 65 - 99 mg/dL   Comment 1 Notify RN    Comment 2 Document in Chart    Ct Chest Wo Contrast  Result Date: 12/22/2016 CLINICAL DATA:  Evaluate pneumothorax seen on CT of the abdomen and pelvis. Initial encounter. EXAM: CT CHEST WITHOUT CONTRAST TECHNIQUE: Multidetector CT imaging of the chest was performed following the standard protocol without IV contrast. COMPARISON:  CT of the abdomen and pelvis performed earlier today at 3:40 p.m., and CT of the chest performed 12/16/2016 FINDINGS:  Cardiovascular: The heart is normal in size. Minimal calcification is seen along the aortic arch. The great vessels are grossly unremarkable in appearance. Mediastinum/Nodes: The mediastinum is unremarkable in appearance. No mediastinal lymphadenopathy is seen. No pericardial effusion is identified. The visualized portions of the thyroid gland are unremarkable. No axillary lymphadenopathy is seen. An apparent enteric tube is noted ending at the proximal esophagus. Lungs/Pleura: There has been interval improvement in patchy  airspace opacity within both lungs, likely reflecting improving infectious process. Mild residual hazy airspace opacities are seen particularly at the right middle lobe and left lingula. A large cavitary lesion is again noted at the periphery of the left upper lobe, relatively stable in size, measuring approximately 6.4 x 3.8 cm. The degree of wall thickening has decreased from the prior study, while the amount of fluid within the cavitary lesion has increased. This suggests strongly against malignancy, and is suspicious for a lung abscess. The previously noted small 1 cm minimally cavitary nodule at the left lower lobe has resolved. Left basilar atelectasis is noted. No definite pleural effusion is seen. A tiny left anterior pneumothorax is noted, localized to the anterior left lung base adjacent to the mediastinum, as seen on CT of the abdomen and pelvis, but new from the prior CT of the chest. Upper Abdomen: The visualized portions of the liver and spleen are grossly unremarkable. The patient is status post cholecystectomy, with clips noted along the gallbladder fossa. Mild soft tissue inflammation and fluid tracks inferiorly along Gerota's fascia bilaterally. The visualized portions of the pancreas, adrenal glands and kidneys are within normal limits. Musculoskeletal: No acute osseous abnormalities are identified. Chronic partially healed bilateral rib fractures are noted. The visualized  musculature is unremarkable in appearance. IMPRESSION: 1. Tiny left anterior pneumothorax, localized to the anterior left lung base adjacent to the mediastinum, as seen on CT of the abdomen and pelvis. 2. Large cavitary lesion again noted at the periphery of the left upper lung lobe, relatively stable in size, measuring 6.4 x 3.8 cm. The degree of wall thickening has decreased from the prior study, while the amount of fluid within the cavitary lesion has increased. This suggests strongly against malignancy, and is suspicious for a lung abscess. 3. Previously noted small 1 cm cavitary nodule at the left lower lobe has resolved. Left basilar atelectasis noted. 4. Interval improvement in patchy airspace opacity within both lungs, likely reflecting improving infectious process. Mild residual hazy opacities at the right middle lobe and left lingula. 5. Apparent enteric tube noted ending at the proximal esophagus. Would correlate clinically. 6. Mild soft tissue inflammation and fluid tracks inferiorly along Gerota's fascia bilaterally. 7. Chronic partially healed bilateral rib fractures noted. Electronically Signed   By: Garald Balding M.D.   On: 12/22/2016 17:51   Ct Abdomen Pelvis W Contrast  Result Date: 12/22/2016 CLINICAL DATA:  Abdominal distension.  Evaluate ileus. EXAM: CT ABDOMEN AND PELVIS WITH CONTRAST TECHNIQUE: Multidetector CT imaging of the abdomen and pelvis was performed using the standard protocol following bolus administration of intravenous contrast. CONTRAST:  179m ISOVUE-300 IOPAMIDOL (ISOVUE-300) INJECTION 61% COMPARISON:  Abdominal x-ray dated 12/20/2016. CT chest/abdomen dated 12/16/2016 FINDINGS: Lower chest: Small pneumothorax at the left lung base anteriorly. Incompletely imaged cavitary mass/consolidation within the inferior aspects of the left upper lobe and/or lingula, as previously described on earlier chest CT. Hepatobiliary: Status post cholecystectomy. Liver appears normal. No bile  duct dilatation. Pancreas: Unremarkable. Spleen: Normal in size without focal abnormality. Adrenals/Urinary Tract: Adrenal glands appear normal. Kidneys appear normal without mass, stone or hydronephrosis. Bladder is decompressed by Foley catheter. Stomach/Bowel: There is moderate distention of the transverse colon and right colon containing gas and fluid/stool, measuring 6.6 cm diameter and 7.5 cm diameter respectively. Rectosigmoid colon and descending colon are normal in caliber. Small bowel is normal in caliber. Enteric tube well positioned within the decompressed stomach. Vascular/Lymphatic: Scattered atherosclerotic changes of the normal caliber abdominal aorta. No enlarged  lymph nodes seen in the abdomen or pelvis. Reproductive: Unremarkable. Other: Small amount of free fluid in the lower pelvis. Trace free fluid/edema within the upper paracolic gutter regions bilaterally. No abscess collections seen. No free intraperitoneal air. Musculoskeletal: Degenerative changes throughout the slightly scoliotic thoracolumbar spine, mild in degree. No acute or suspicious osseous finding. Multiple old rib fractures bilaterally. IMPRESSION: 1. Moderate distension of the transverse colon and right colon, containing gas and fluid/stool, measuring 6.6 cm diameter and 7.5 cm diameter respectively, most compatible with the given history of ileus as there is no transition point to suggest mechanical large bowel obstruction. 2. Pneumothorax at the left lung base anteriorly, incompletely imaged, perhaps related to a interval surgical/diagnostic intervention for the previously described cavitary mass/consolidation within the left lung. If no recent surgical/diagnostic intervention to explain this incompletely imaged pneumothorax at the left lung base, recommend chest CT for complete characterization. 3. Small amount of free fluid in the abdomen and pelvis. No abscess collection seen. No free intraperitoneal air. 4. Aortic  atherosclerosis. These results were called by telephone at the time of interpretation on 12/22/2016 at 4:14 pm to Dr. Bonnielee Haff , who verbally acknowledged these results. Electronically Signed   By: Franki Cabot M.D.   On: 12/22/2016 16:15   Dg Chest Port 1 View  Result Date: 12/22/2016 CLINICAL DATA:  Acute hypoxic respiratory failure. Streptococcal pneumonia. EXAM: PORTABLE CHEST 1 VIEW COMPARISON:  12/20/2016, 12/21/2016, 12/22/2016 FINDINGS: Cavitary lesion in the left lung appears to contain more fluid today, as observed on accompanying CT. No other significant interval change. The small left pneumothorax might be marginally visible in the apex. No significant pneumothorax. Right lung is clear. Hilar and mediastinal contours are unremarkable and unchanged. IMPRESSION: Air-fluid level within the left cavitary lesion. No significant pneumothorax. Trace pneumothorax may be visible in the left apex, also seen on CT. Electronically Signed   By: Andreas Newport M.D.   On: 12/22/2016 22:02   Dg Abd Portable 1v  Result Date: 12/23/2016 CLINICAL DATA:  Nasogastric tube placement.  Initial encounter. EXAM: PORTABLE ABDOMEN - 1 VIEW COMPARISON:  CT of the abdomen pelvis performed 12/22/2016 FINDINGS: The patient's enteric tube is noted ending overlying the body of the stomach. Air and contrast filled dilated loops of colon are again noted, likely reflecting ileus. There is no definite evidence for bowel obstruction. No free intra-abdominal air is seen, though evaluation for free air is noted on a single supine view. No acute osseous abnormalities are seen. The patient's left-sided cavitary lung lesion is partially imaged. IMPRESSION: 1. Enteric tube noted ending overlying the body of the stomach. 2. Air and contrast filled dilated loops of colon again noted, likely reflecting ileus. No free intra-abdominal air seen. Electronically Signed   By: Garald Balding M.D.   On: 12/23/2016 00:43   Dg Swallowing  Func-speech Pathology  Result Date: 12/22/2016 Objective Swallowing Evaluation: Type of Study: MBS-Modified Barium Swallow Study Patient Details Name: Milaina CHANDNI GAGAN MRN: [id number] Date of Birth: 26-Mar-1966 Today's Date: 12/22/2016 Time: SLP Start Time (ACUTE ONLY): 0951-SLP Stop Time (ACUTE ONLY): 1012 SLP Time Calculation (min) (ACUTE ONLY): 21 min Past Medical History: Past Medical History: Diagnosis Date . Allergic rhinitis  . Arthritis  . Asthma  . Back pain  . Bipolar 1 disorder ([place])  . Chronic low back pain 03/06/2014 . COPD (chronic obstructive pulmonary disease) ([place])  . Depression  . Fibroid  . Hypertension  . IBS (irritable bowel syndrome)  . Migraine  . Scoliosis  Past Surgical History: Past Surgical History: Procedure Laterality Date . CHOLECYSTECTOMY   . DILATION AND CURETTAGE OF UTERUS   . EXPLORATORY LAPAROTOMY    WHEN PATIENT WAS YOUNG . RIGHT FOOT SURGERY   . TUBAL LIGATION Bilateral  HPI: 51 y/o F, 45 pk year smoker, with Hx of COPD/asthma and bipolar disorder admitted 1/19 with SOB. Working diagnosis of acute asthma exacerbation, multilobar PNA with resultant hypoxic respiratory failure. Intubated 12/12/16 to 12/20/16.  Subjective: alert for evaluation, requires cues to maintain alertness Assessment / Plan / Recommendation CHL IP CLINICAL IMPRESSIONS 12/22/2016 Therapy Diagnosis Mild pharyngeal phase dysphagia;Moderate pharyngeal phase dysphagia  Clinical Impression Ms. Khanna was cooperative but drowsy during the MBS and required moderate cueing to attend to the assessment and POs. Pt exhibits mild-moderate pharyngeal dysphagia characterized by delayed swallow initiation that results in silent penetration (able to clear with 2nd reflexive swallow) during nectar thick consistencies. Thicker textures were better contained in the valleculae before the swallow. Backflow from upper esophageal segment to pharynx also noted with nectar thick liquids. Due to prolonged intubation, fluctuating  mentation/alertness, and hx of CVA, recommend diet of honey thick liquids, pureed solids, meds whole in puree, with full supervision during mealtimes, small bites/sips, seated upright. ST will f/u with pt for treatment to assess safety/efficiency of swallowing function and possible diet upgrade.  Impact on safety and function Moderate aspiration risk   [facility] IP TREATMENT RECOMMENDATION 12/22/2016 Treatment Recommendations Therapy as outlined in treatment plan below   Prognosis 12/22/2016 Prognosis for Safe Diet Advancement Good Barriers to Reach Goals -- Barriers/Prognosis Comment -- [facility] IP DIET RECOMMENDATION 12/22/2016 SLP Diet Recommendations Dysphagia 1 (Puree) solids;Honey thick liquids Liquid Administration via Cup;No straw Medication Administration Whole meds with puree Compensations Slow rate;Small sips/bites Postural Changes Seated upright at 90 degrees;Remain semi-upright after after feeds/meals (Comment)   CH[facility] IP OTHER RECOMMENDATIONS 12/22/2016 Recommended Consults -- Oral Care Recommendations Oral care BID Other Recommendations Order thickener from pharmacy;Prohibited food (jello, ice cream, thin soups);Remove water pitcher   [facility] IP FOLLOW UP RECOMMENDATIONS 12/22/2016 Follow up Recommendations Skilled Nursing facility   [facility] IP FREQUENCY AND DURATION 12/22/2016 Speech Therapy Frequency (ACUTE ONLY) min 2x/week Treatment Duration 2 weeks      CH[facility] IP ORAL PHASE 12/22/2016 Oral Phase WFL Oral - Pudding Teaspoon -- Oral - Pudding Cup -- Oral - Honey Teaspoon -- Oral - Honey Cup -- Oral - Nectar Teaspoon -- Oral - Nectar Cup -- Oral - Nectar Straw -- Oral - Thin Teaspoon -- Oral - Thin Cup -- Oral - Thin Straw -- Oral - Puree -- Oral - Mech Soft -- Oral - Regular -- Oral - Multi-Consistency -- Oral - Pill -- Oral Phase - Comment --  [facility] IP PHARYNGEAL PHASE 12/22/2016 Pharyngeal Phase Impaired Pharyngeal- Pudding Teaspoon -- Pharyngeal -- Pharyngeal- Pudding Cup -- Pharyngeal -- Pharyngeal- Honey Teaspoon -- Pharyngeal  -- Pharyngeal- Honey Cup Delayed swallow initiation-vallecula;Other (Comment) Pharyngeal -- Pharyngeal- Nectar Teaspoon Delayed swallow initiation-pyriform sinuses;Penetration/Aspiration before swallow;Other (Comment) Pharyngeal Material enters airway, remains ABOVE vocal cords then ejected out Pharyngeal- Nectar Cup Delayed swallow initiation-pyriform sinuses;Other (Comment);Penetration/Aspiration before swallow Pharyngeal Material enters airway, remains ABOVE vocal cords then ejected out Pharyngeal- Nectar Straw -- Pharyngeal -- Pharyngeal- Thin Teaspoon -- Pharyngeal -- Pharyngeal- Thin Cup -- Pharyngeal -- Pharyngeal- Thin Straw -- Pharyngeal -- Pharyngeal- Puree Delayed swallow initiation-vallecula Pharyngeal -- Pharyngeal- Mechanical Soft -- Pharyngeal -- Pharyngeal- Regular -- Pharyngeal -- Pharyngeal- Multi-consistency -- Pharyngeal -- Pharyngeal- Pill -- Pharyngeal -- Pharyngeal Comment --  CH[facility] IP CERVICAL ESOPHAGEAL PHASE 12/22/2016 Cervical Esophageal Phase Impaired Pudding Teaspoon -- Pudding Cup -- Honey Teaspoon -- Honey Cup -- Nectar Teaspoon -- Nectar Cup Esophageal backflow into the pharynx Nectar Straw -- Thin Teaspoon -- Thin Cup -- Thin Straw -- Puree -- Mechanical Soft -- Regular -- Multi-consistency -- Pill -- Cervical Esophageal Comment -- No flowsheet data found. Germain Osgood 12/22/2016, 11:25 AM  Note populated for Fransisca Kaufmann, student SLP Germain Osgood, M.A. CCC-SLP [phone]                Assessment/Plan  Problem list Acute hypoxic respiratory failure secondary to Multilobar PNA with small pneumothorax Multilobar pneumonia with strep pneumonia with cavitary lesion - strep pneumo. Demand ischemia with history of grade 2 diastolic dysfunction AKI - Resolved Acute asthma exacerbation Ischemic hepatitis - At admission AST was 2024 and ALT was 1366. Most likely due to shock liver. Hepatitis panel negative. LFTs have improved. Dysphagia - modified barium swallow on  1/29, cleared for dysphagia 1 diet.  Leukocytosis Acute Anemia Mouth sores - acyclovir. Acute encephalopathy Acute CVA Bipolar d/o  Colonic Ileus with C. diff - pt having abdominal pain and bloating, imaging concerning for ileus. - +C. Diff, concerns for developing into toxic megacolon. Abd xray today showed some improvement in bowel distention. - NGT placed yesterday, pt pulled NGT and is refusing another one.   - - no surgical intervention is indicated at this time. We will continue to follow the pt, thank you for the consult.   Kalman Drape, [facility] Surgery 12/23/2016, 10:28 AM Pager: [phone] Consults: [phone] Mon-Fri 7:00 am-4:30 pm Sat-Sun 7:00 am-11:30 am

## 2016-12-23 NOTE — Progress Notes (Signed)
Name: [name] MRN: [id number] DOB: Jan 17, 1966    LOS: 11  PCCM Consult NOTE  History of Present Illness: Patient is a 51 yo F recently discharged from the ICU after intubation (1/19 - 1/27) for acute hypoxic respiratory failure due to multilobar PNA. Her past medical history is significant for asthma, COPD, and bipolar disorder. She presented on 1/19 via EMS for AMS and lethargy. History was initially provided by the daughter due to her acute encephalopathy. Her daughter reported a productive cough x 3 days with associated fatigue, subjective fevers, chills, myalgias, and confusion. Her O2 sat was in the 60s and she was initially placed on BiPAP without improvement and ultimately intubated. Work up revealed a multilobar PNA on CXR. She was improved with breathing treatments and antibiotics and was extubated 1/27.   Today, PCCM was called to evaluate patient for increasing oxygen requirement from room air to 5L [place], worsening tachypnea, and tachycardia. CT abdomen performed earlier today revealed a colonic ileus without evidence of bowel obstruction. There was also note of a partially imaged pneumothorax at the left lung base that was characterized as "tiny" on follow up chest CT. Her known left upper lobe lung abscess appeared unchanged with interval improvement in patchy airspace opacities bilaterally. NGT however was noted to be malpositioned ending at the proximal esophagus. Per RN, NGT was removed earlier today. She also passed her speech evaluation and diet was advanced to dysphagia 1. She was given prn morphine and oxy 15 prior to our evaluation.   Lines / Drains: NGT 1/29 >>  Cultures: Blood 1/19 >> Negative Respiratory 1/20 >> Strep Pneumo  RVP 1/20 >> Negative  Oral HSV 1/22 >> positive (completed acyclovir)  Blood 1/24 >> Negative  C diff 1/28 >> Antigen & PCR positive   Antibiotics: Azithromycin 1/19 - 1/21 Ceftriaxone 1/19 - 1/23 Acyclovir 1/22 - 1/27 Unasyn 1/24 >> Vanco  1/29 >>  Tests / Events: Admit / Intubate 1/19 Extubated 1/27   SUBJECTIVE:  Pulled out NGT, not willing to have it replaced.  She seems confused so RN waiting for daughter to visit in hopes daughter can convince pt that she needs NGT replaced. Follow up CXR with no very small PTX still (but no distress).  Vital Signs: Temp:  [97.6 F (36.4 C)-100.3 F (37.9 C)] 97.8 F (36.6 C) (01/30 1203) Pulse Rate:  [103-118] 110 (01/30 1203) Resp:  [18-40] 34 (01/30 1203) BP: (136-177)/(86-105) 164/96 (01/30 1203) SpO2:  [91 %-100 %] 98 % (01/30 1203) I/O last 3 completed shifts: In: 2789.2 [P.O.:60; I.V.:2229.2; IV [id number] Out: [id number] [Urine:1225; Emesis/NG output:100; Stool:1]  Physical Examination: General: Anxious, getting back with nursing staff Neuro:  Awake. Answers basic questions but confused for most part. HEENT:  PERRL Neck:  Supple, trachea midline   Cardiovascular:  Tachycardic but regular rhythm, no murmurs, rubs or gallops Lungs:  Course bronchial breath sounds bilaterally. Abdomen:  Distended, tender to palpation in all 4 quadrants, high pitched and hyperactive bowel sounds, + guarding  Extremities: Distal pulses intact, no edema  Skin:  No rashes or erythema   Labs and Imaging:  Personally reviewed.   CXR 1/19 >> Bilateraly multilobar PNA CT Head 1/19 >> Left occipital lobe acute vs subacute infarction  US abdomen 1/20 >> Postcholecystecomty without biliary dilation  CXR 1/20 >> Bilateral pulmonary opacities (L>R) unchanged  Brain MRI 1/21 >> Left occipital lobe questioned by CT is negative by  MRI  CXR 1/23 >> Persistent infiltrates, new area of cavitation at  the lateral to mid lower left lung consistent with an abscess  CT Chest 1/23 >> Left upper lobe lung abscess, 5.9 cm vs neoplastic process  Modified Barium Swallow Study 1/29 >> moderate pharyngeal phase dysphagia  CT Abdomen Pelvis 1/29 >> Colonic ileus, no evidence of SBO CT Chest 1/29 >> Large cavitary  lesion stable in size suspicious for abcess, interval improvement in patchy airspace disease bilaterally, enteric tube noted to end at the proximal esophagus (recommended clinical correlation)    Discussion: Patient is a 51 yo F with pmhx significant for asthma, COPD, bipolar disorder who was recently intubated for multilobar PNA, now with tachypnea and abdominal distention, likely multifactorial due to colonic ileus and asthma. For now, we will place an NGT, obtain KUB, check electrolytes and make patient NPO. Opioids may be worsening ileus. Also, patient may be developing severe complicated C. Diff causing ileus. She will be transferred to SDU, remain on [facility] service, and we will continue to follow along. Flagyl has been added, vanc switched to rectal enema as not tolerating po. Pulled out NGT 01/30, hoping to have daughter convince her that she needs it replaced. Small incidental finding of right PTX on CT 01/29, repeat CXR 01/30 with tiny residual.   Assessment and Plan:  PULMONARY A: Acute Hypoxic Respiratory Failure: Now requiring 5L [place], likely 2/2 asthma and splinting from abdominal pain and distention. CXR opacities appear overall improved from yesterday. ABG with PaO2 of 71.7 on 5L [place]. Left Fluid Filled Cavitary Lesions:  Concerning for abscess, on Unasyn. Multilobar PNA Asthma Trace PTX P:   Relieve abdominal pressure with NGT - daughter to hopefully convince pt that she needs it replaced Continue Duonebs Q6H Albuterol Q2H prn  Continue unasyn  CARDIOVASCULAR A:  Sinus Tachycardia HTN HFpEF P:  Metoprolol 5 mg Q6H Hydralazine Q4H prn  ASA  RENAL A:   Hypokalemia: Last K 5.1, improved from prior.  Now resolved. Hypernatremia - resolved. P:   D5 75 cc/hr Keep K > 4, Mg > 2 given ileus Follow BMP  GASTROINTESTINAL A:   Adynamic Ileus likely 2/2 opioids, hypokalemia, possible C. Diff Dysphagia C. Diff Colitis: Started on empiric po vanc given increased stool  output. Antigen positive, toxin negative. PCR positive. No colitis seen on CT scan. Moderate amount of liquid brown stool per colostomy. Ileus likely due to C. Diff versus opioids. If due to C. Diff, patient could be considered severe complicated C. Diff; will need close monitoring, will add IV Flagyl, switch to rectal vanc enemas as NPO and on NGT suction.  P:   Continue Vanc rectal enemas, flagyl Continue NGT to LIMS Serial abdominal exams Replace NGT Bowel Rest Surgery consulted, management per them Stop opioids  HEMATOLOGIC A:   Mild anemia. P:  Transfuse for Hgb < 7. CBC in AM.  INFECTIOUS A:   Left Cavitary Fluid Filled Lesion concerning for Abscess C. 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 Patient seen by neurology. Stroke thought to be secondary to acute illness.  Acute Encephalopathy P:   ASA QD Holding home seroquel, lexapro and lamictal while NPO  Rest of care per primary team.   Montey Hora, PA - [facility] [place] [facility] Pager: [phone]  or [phone]  [phone] 12/23/2016, 12:25 PM    ATTENDING NOTE / ATTESTATION NOTE :   I have discussed the case with the resident/APP  Montey Hora PA.   I agree with the resident/APP's  history, physical examination, assessment, and plans.    I have edited the above note and modified it according to our agreed history, physical examination, assessment and plan.   Patient is a 51 yo F recently discharged from the ICU after intubation (1/19 - 1/27) for acute hypoxic respiratory failure due to multilobar PNA. Her past medical history is significant for asthma, COPD, and bipolar disorder.  PCCM was called to evaluate patient again on 1/29  for increasing oxygen requirement from room air  to 5L [place], worsening tachypnea, and tachycardia. CT abdomen performed earlier today revealed a colonic ileus without evidence of bowel obstruction. There was also note of a partially imaged pneumothorax at the left lung base that was characterized as "tiny" on follow up chest CT. Her known left upper lobe lung abscess appeared unchanged with interval improvement in patchy airspace opacities bilaterally.   Vitals:  Vitals:   12/23/16 0732 12/23/16 1203 12/23/16 1321 12/23/16 1433  BP:  (!) 164/96    Pulse:  (!) 110    Resp:  (!) 34    Temp:  97.8 F (36.6 C)  99.7 F (37.6 C)  TempSrc:  Oral  Axillary  SpO2: 97% 98% 99%   Weight:      Height:        Constitutional/General: well-nourished, well-developed, in mild  Distress. splingting 2/2 abd pain.   Body mass index is 27.97 kg/m. Wt Readings from Last 3 Encounters:  12/21/16 73.9 kg (162 lb 14.7 oz)  12/01/16 75.2 kg (165 lb 12.8 oz)  08/14/16 77.1 kg (170 lb)    HEENT: PERLA, anicteric sclerae. (-) Oral thrush. On [place]  Neck: No masses. Midline trachea. No JVD, (-) LAD. (-) bruits appreciated.  Respiratory/Chest: Grossly normal chest. (-) deformity. (-) Accessory muscle use.  Symmetric expansion. Diminished BS on both lower lung zones. (-) wheezing, crackles, rhonchi (-) egophony  Cardiovascular: Regular rate and  rhythm, heart sounds normal, no murmur or gallops,  Trace peripheral edema  Gastrointestinal:  Normal bowel sounds. Soft, non-tender. No hepatosplenomegaly.  (-) masses.   Musculoskeletal:  Normal muscle tone.   Extremities: Grossly normal. (-) clubbing, cyanosis.  (-) edema  Skin: (-) rash,lesions seen.   Neurological/Psychiatric : sedated, intubated. CN grossly intact. (-) lateralizing signs.   CBC Recent Labs     12/22/16  0501  12/22/16  2324  12/23/16  0222  WBC  18.0*  24.3*  25.2*  HGB  11.1*  11.0*  10.4*  HCT  35.3*  35.3*  33.7*  PLT  453*  532*  551*    Coag's No results for input(s):  APTT, INR in the last 72 hours.  BMET Recent Labs     12/22/16  0501  12/22/16  2324  12/23/16  0222  NA  147*  143  145  K  5.1  3.6  3.5  CL  113*  109  109  CO2  23  22  25   BUN  15  12  12   CREATININE  0.60  0.55  0.57  GLUCOSE  100*  109*  108*    Electrolytes Recent Labs     12/21/16  0821   12/22/16  0501  12/22/16  2324  12/23/16  0222  CALCIUM   --    < >  9.1  8.9  8.9  MG  2.1   --   2.3   --   1.8  PHOS  2.9   --   3.3   --    --    < > = values in this interval not displayed.    Sepsis Markers No results for input(s): PROCALCITON, O2SATVEN in the last 72 hours.  Invalid input(s): LACTICACIDVEN  ABG Recent Labs     12/22/16  2315  PHART  7.447  PCO2ART  37.3  PO2ART  71.7*    Liver Enzymes Recent Labs     12/22/16  2324  12/23/16  0222  AST  23  26  ALT  67*  63*  ALKPHOS  75  71  BILITOT  0.7  0.7  ALBUMIN  3.0*  2.7*    Cardiac Enzymes No results for input(s): TROPONINI, PROBNP in the last 72 hours.  Glucose Recent Labs     12/23/16  0148  12/23/16  0359  12/23/16  0720  12/23/16  1200  12/23/16  1456  12/23/16  1509  GLUCAP  113*  125*  139*  118*  124*  109*    Imaging Ct Chest Wo Contrast  Result Date: 12/22/2016 CLINICAL DATA:  Evaluate pneumothorax seen on CT of the abdomen and pelvis. Initial encounter. EXAM: CT CHEST WITHOUT CONTRAST TECHNIQUE: Multidetector CT imaging of the chest was performed following the standard protocol without IV contrast. COMPARISON:  CT of the abdomen and pelvis performed earlier today at 3:40 p.m., and CT of the chest performed 12/16/2016 FINDINGS: Cardiovascular: The heart is normal in size. Minimal calcification is seen along the aortic arch. The great vessels are grossly unremarkable in appearance. Mediastinum/Nodes: The mediastinum is unremarkable in appearance. No mediastinal lymphadenopathy is seen. No pericardial effusion is identified. The visualized portions of the thyroid gland are  unremarkable. No axillary lymphadenopathy is seen. An apparent enteric tube is noted ending at the proximal esophagus. Lungs/Pleura: There has been interval improvement in patchy airspace opacity within both lungs, likely reflecting improving infectious process. Mild residual hazy airspace opacities are seen particularly at the right middle lobe and left lingula. A large cavitary lesion is again noted at the periphery of the left upper lobe, relatively stable in size, measuring approximately 6.4 x 3.8 cm. The degree of wall thickening has decreased from the prior study, while the amount of fluid within the cavitary lesion has increased. This suggests strongly against malignancy, and is suspicious for a lung abscess. The previously noted small 1 cm minimally cavitary nodule at the left lower lobe has resolved. Left basilar atelectasis is noted. No definite pleural effusion is seen. A tiny left anterior pneumothorax is noted, localized to the anterior left lung base adjacent to the mediastinum, as seen on CT of the abdomen and pelvis, but new from the prior CT of the chest. Upper Abdomen: The visualized portions of the liver and spleen are grossly unremarkable. The patient is status post cholecystectomy, with clips noted along the gallbladder fossa. Mild soft tissue inflammation and fluid tracks inferiorly along Gerota's fascia bilaterally. The visualized portions of the pancreas, adrenal glands and kidneys are within normal limits. Musculoskeletal: No acute osseous abnormalities are identified. Chronic partially healed bilateral rib fractures are noted. The visualized musculature is unremarkable in appearance. IMPRESSION: 1. Tiny left anterior pneumothorax, localized to the anterior left lung base adjacent to the mediastinum, as seen on CT of the abdomen and pelvis. 2. Large cavitary lesion again noted  at the periphery of the left upper lung lobe, relatively stable in size, measuring 6.4 x 3.8 cm. The degree of wall  thickening has decreased from the prior study, while the amount of fluid within the cavitary lesion has increased. This suggests strongly against malignancy, and is suspicious for a lung abscess. 3. Previously noted small 1 cm cavitary nodule at the left lower lobe has resolved. Left basilar atelectasis noted. 4. Interval improvement in patchy airspace opacity within both lungs, likely reflecting improving infectious process. Mild residual hazy opacities at the right middle lobe and left lingula. 5. Apparent enteric tube noted ending at the proximal esophagus. Would correlate clinically. 6. Mild soft tissue inflammation and fluid tracks inferiorly along Gerota's fascia bilaterally. 7. Chronic partially healed bilateral rib fractures noted. Electronically Signed   By: Garald Balding M.D.   On: 12/22/2016 17:51   Ct Abdomen Pelvis W Contrast  Result Date: 12/22/2016 CLINICAL DATA:  Abdominal distension.  Evaluate ileus. EXAM: CT ABDOMEN AND PELVIS WITH CONTRAST TECHNIQUE: Multidetector CT imaging of the abdomen and pelvis was performed using the standard protocol following bolus administration of intravenous contrast. CONTRAST:  111mL ISOVUE-300 IOPAMIDOL (ISOVUE-300) INJECTION 61% COMPARISON:  Abdominal x-ray dated 12/20/2016. CT chest/abdomen dated 12/16/2016 FINDINGS: Lower chest: Small pneumothorax at the left lung base anteriorly. Incompletely imaged cavitary mass/consolidation within the inferior aspects of the left upper lobe and/or lingula, as previously described on earlier chest CT. Hepatobiliary: Status post cholecystectomy. Liver appears normal. No bile duct dilatation. Pancreas: Unremarkable. Spleen: Normal in size without focal abnormality. Adrenals/Urinary Tract: Adrenal glands appear normal. Kidneys appear normal without mass, stone or hydronephrosis. Bladder is decompressed by Foley catheter. Stomach/Bowel: There is moderate distention of the transverse colon and right colon containing gas and  fluid/stool, measuring 6.6 cm diameter and 7.5 cm diameter respectively. Rectosigmoid colon and descending colon are normal in caliber. Small bowel is normal in caliber. Enteric tube well positioned within the decompressed stomach. Vascular/Lymphatic: Scattered atherosclerotic changes of the normal caliber abdominal aorta. No enlarged lymph nodes seen in the abdomen or pelvis. Reproductive: Unremarkable. Other: Small amount of free fluid in the lower pelvis. Trace free fluid/edema within the upper paracolic gutter regions bilaterally. No abscess collections seen. No free intraperitoneal air. Musculoskeletal: Degenerative changes throughout the slightly scoliotic thoracolumbar spine, mild in degree. No acute or suspicious osseous finding. Multiple old rib fractures bilaterally. IMPRESSION: 1. Moderate distension of the transverse colon and right colon, containing gas and fluid/stool, measuring 6.6 cm diameter and 7.5 cm diameter respectively, most compatible with the given history of ileus as there is no transition point to suggest mechanical large bowel obstruction. 2. Pneumothorax at the left lung base anteriorly, incompletely imaged, perhaps related to a interval surgical/diagnostic intervention for the previously described cavitary mass/consolidation within the left lung. If no recent surgical/diagnostic intervention to explain this incompletely imaged pneumothorax at the left lung base, recommend chest CT for complete characterization. 3. Small amount of free fluid in the abdomen and pelvis. No abscess collection seen. No free intraperitoneal air. 4. Aortic atherosclerosis. These results were called by telephone at the time of interpretation on 12/22/2016 at 4:14 pm to Dr. Bonnielee Haff , who verbally acknowledged these results. Electronically Signed   By: Franki Cabot M.D.   On: 12/22/2016 16:15   Dg Chest Port 1 View  Result Date: 12/23/2016 CLINICAL DATA:  Shortness of breath. EXAM: PORTABLE CHEST 1 VIEW  COMPARISON:  Radiograph of December 22, 2016. FINDINGS: Stable cardiomediastinal silhouette. No pneumothorax is noted.  Mild central pulmonary vascular congestion is again noted. Stable mild loculated left pleural effusion is noted. Old right rib fractures are noted. IMPRESSION: Stable cardiomediastinal silhouette with mild central pulmonary vascular congestion. Stable mild loculated left pleural effusion. Electronically Signed   By: Marijo Conception, M.D.   On: 12/23/2016 14:09   Dg Chest Port 1 View  Result Date: 12/22/2016 CLINICAL DATA:  Acute hypoxic respiratory failure. Streptococcal pneumonia. EXAM: PORTABLE CHEST 1 VIEW COMPARISON:  12/20/2016, 12/21/2016, 12/22/2016 FINDINGS: Cavitary lesion in the left lung appears to contain more fluid today, as observed on accompanying CT. No other significant interval change. The small left pneumothorax might be marginally visible in the apex. No significant pneumothorax. Right lung is clear. Hilar and mediastinal contours are unremarkable and unchanged. IMPRESSION: Air-fluid level within the left cavitary lesion. No significant pneumothorax. Trace pneumothorax may be visible in the left apex, also seen on CT. Electronically Signed   By: Andreas Newport M.D.   On: 12/22/2016 22:02   Dg Abd Portable 1v  Result Date: 12/23/2016 CLINICAL DATA:  Ileus, pneumonia EXAM: PORTABLE ABDOMEN - 1 VIEW COMPARISON:  12/23/2016 FINDINGS: Mild gaseous distention of bowel, slightly improved since prior study. Oral contrast material noted within the appendix and right colon. No free air organomegaly. Prior cholecystectomy. IMPRESSION: Some improvement in diffuse gaseous distention of bowel. Electronically Signed   By: Rolm Baptise M.D.   On: 12/23/2016 11:21   Dg Abd Portable 1v  Result Date: 12/23/2016 CLINICAL DATA:  Nasogastric tube placement.  Initial encounter. EXAM: PORTABLE ABDOMEN - 1 VIEW COMPARISON:  CT of the abdomen pelvis performed 12/22/2016 FINDINGS: The  patient's enteric tube is noted ending overlying the body of the stomach. Air and contrast filled dilated loops of colon are again noted, likely reflecting ileus. There is no definite evidence for bowel obstruction. No free intra-abdominal air is seen, though evaluation for free air is noted on a single supine view. No acute osseous abnormalities are seen. The patient's left-sided cavitary lung lesion is partially imaged. IMPRESSION: 1. Enteric tube noted ending overlying the body of the stomach. 2. Air and contrast filled dilated loops of colon again noted, likely reflecting ileus. No free intra-abdominal air seen. Electronically Signed   By: Garald Balding M.D.   On: 12/23/2016 00:43   Dg Swallowing Func-speech Pathology  Result Date: 12/22/2016 Objective Swallowing Evaluation: Type of Study: MBS-Modified Barium Swallow Study Patient Details Name: [name] MRN: [id number] Date of Birth: 01-10-1966 Today's Date: 12/22/2016 Time: SLP Start Time (ACUTE ONLY): 0951-SLP Stop Time (ACUTE ONLY): 1012 SLP Time Calculation (min) (ACUTE ONLY): 21 min Past Medical History: Past Medical History: Diagnosis Date . Allergic rhinitis  . Arthritis  . Asthma  . Back pain  . Bipolar 1 disorder ([place])  . Chronic low back pain 03/06/2014 . COPD (chronic obstructive pulmonary disease) ([place])  . Depression  . Fibroid  . Hypertension  . IBS (irritable bowel syndrome)  . Migraine  . Scoliosis  Past Surgical History: Past Surgical History: Procedure Laterality Date . CHOLECYSTECTOMY   . DILATION AND CURETTAGE OF UTERUS   . EXPLORATORY LAPAROTOMY    WHEN PATIENT WAS YOUNG . RIGHT FOOT SURGERY   . TUBAL LIGATION Bilateral  HPI: 51 y/o F, 45 pk year smoker, with Hx of COPD/asthma and bipolar disorder admitted 1/19 with SOB. Working diagnosis of acute asthma exacerbation, multilobar PNA with resultant hypoxic respiratory failure. Intubated 12/12/16 to 12/20/16.  Subjective: alert for evaluation, requires cues to maintain alertness  Assessment / Plan / Recommendation CHL IP CLINICAL IMPRESSIONS 12/22/2016 Therapy Diagnosis Mild pharyngeal phase dysphagia;Moderate pharyngeal phase dysphagia  Clinical Impression Ms. Hassan was cooperative but drowsy during the MBS and required moderate cueing to attend to the assessment and POs. Pt exhibits mild-moderate pharyngeal dysphagia characterized by delayed swallow initiation that results in silent penetration (able to clear with 2nd reflexive swallow) during nectar thick consistencies. Thicker textures were better contained in the valleculae before the swallow. Backflow from upper esophageal segment to pharynx also noted with nectar thick liquids. Due to prolonged intubation, fluctuating mentation/alertness, and hx of CVA, recommend diet of honey thick liquids, pureed solids, meds whole in puree, with full supervision during mealtimes, small bites/sips, seated upright. ST will f/u with pt for treatment to assess safety/efficiency of swallowing function and possible diet upgrade.  Impact on safety and function Moderate aspiration risk   CHL IP TREATMENT RECOMMENDATION 12/22/2016 Treatment Recommendations Therapy as outlined in treatment plan below   Prognosis 12/22/2016 Prognosis for Safe Diet Advancement Good Barriers to Reach Goals -- Barriers/Prognosis Comment -- CH[facility] IP DIET RECOMMENDATION 12/22/2016 SLP Diet Recommendations Dysphagia 1 (Puree) solids;Honey thick liquids Liquid Administration via Cup;No straw Medication Administration Whole meds with puree Compensations Slow rate;Small sips/bites Postural Changes Seated upright at 90 degrees;Remain semi-upright after after feeds/meals (Comment)   CHL IP OTHER RECOMMENDATIONS 12/22/2016 Recommended Consults -- Oral Care Recommendations Oral care BID Other Recommendations Order thickener from pharmacy;Prohibited food (jello, ice cream, thin soups);Remove water pitcher   CHL IP FOLLOW UP RECOMMENDATIONS 12/22/2016 Follow up Recommendations Skilled Nursing  facility   [facility] IP FREQUENCY AND DURATION 12/22/2016 Speech Therapy Frequency (ACUTE ONLY) min 2x/week Treatment Duration 2 weeks      CHL IP ORAL PHASE 12/22/2016 Oral Phase WFL Oral - Pudding Teaspoon -- Oral - Pudding Cup -- Oral - Honey Teaspoon -- Oral - Honey Cup -- Oral - Nectar Teaspoon -- Oral - Nectar Cup -- Oral - Nectar Straw -- Oral - Thin Teaspoon -- Oral - Thin Cup -- Oral - Thin Straw -- Oral - Puree -- Oral - Mech Soft -- Oral - Regular -- Oral - Multi-Consistency -- Oral - Pill -- Oral Phase - Comment --  CHL IP PHARYNGEAL PHASE 12/22/2016 Pharyngeal Phase Impaired Pharyngeal- Pudding Teaspoon -- Pharyngeal -- Pharyngeal- Pudding Cup -- Pharyngeal -- Pharyngeal- Honey Teaspoon -- Pharyngeal -- Pharyngeal- Honey Cup Delayed swallow initiation-vallecula;Other (Comment) Pharyngeal -- Pharyngeal- Nectar Teaspoon Delayed swallow initiation-pyriform sinuses;Penetration/Aspiration before swallow;Other (Comment) Pharyngeal Material enters airway, remains ABOVE vocal cords then ejected out Pharyngeal- Nectar Cup Delayed swallow initiation-pyriform sinuses;Other (Comment);Penetration/Aspiration before swallow Pharyngeal Material enters airway, remains ABOVE vocal cords then ejected out Pharyngeal- Nectar Straw -- Pharyngeal -- Pharyngeal- Thin Teaspoon -- Pharyngeal -- Pharyngeal- Thin Cup -- Pharyngeal -- Pharyngeal- Thin Straw -- Pharyngeal -- Pharyngeal- Puree Delayed swallow initiation-vallecula Pharyngeal -- Pharyngeal- Mechanical Soft -- Pharyngeal -- Pharyngeal- Regular -- Pharyngeal -- Pharyngeal- Multi-consistency -- Pharyngeal -- Pharyngeal- Pill -- Pharyngeal -- Pharyngeal Comment --  CHL IP CERVICAL ESOPHAGEAL PHASE 12/22/2016 Cervical Esophageal Phase Impaired Pudding Teaspoon -- Pudding Cup -- Honey Teaspoon -- Honey Cup -- Nectar Teaspoon -- Nectar Cup Esophageal backflow into the pharynx Nectar Straw -- Thin Teaspoon -- Thin Cup -- Thin Straw -- Puree -- Mechanical Soft -- Regular --  Multi-consistency -- Pill -- Cervical Esophageal Comment -- No flowsheet data found. Germain Osgood 12/22/2016, 11:25 AM  Note populated for Fransisca Kaufmann, student SLP Germain Osgood, M.A. CCC-SLP [phone]  Assessment and Plan : 1. Acute hypoxemic resp failure, multifactorial. Likely 2/2 abd distention/ileus, narcotics. Cont to keep o2 sats > 88%. Currently on 4L and sats are 97%. weand down O2 to keep sats > 88%. Does not appear to be in asthma/copd exacerbation. May need diuresis.   2. Tiny left anterior PTX.  CXR w/o evidence of PTX.  Will observe for now. CXR prn.   3. Ileus. Plan to reinsert NGT and keep to suction. Pt refuses.   4. Strep pna LUL lung abscess. Cont unasyn.   5. Cdiff colitis. Cont with vanc enema and flagyl.    Family : No family at bedside. Tried convincing pt to have NGT back but she refuses.    Monica Becton, MD 12/23/2016, 3:45 PM [facility] Pager [phone] After 3 pm or if no answer, call [phone]

## 2016-12-23 NOTE — Progress Notes (Signed)
SLP Cancellation Note  Patient Details Name: Verba KENJA POLKINGHORNE MRN: [id number] DOB: October 20, 1966   Cancelled treatment:       Reason Eval/Treat Not Completed: Medical issues which prohibited therapy   Daijon Wenke, Katherene Ponto 12/23/2016, 7:47 AM

## 2016-12-23 NOTE — Progress Notes (Signed)
RN paged MD about med for agitation, also daughter asking about geodon and meds for bipolar depression.

## 2016-12-23 NOTE — NC FL2 (Signed)
[place] LEVEL OF CARE SCREENING TOOL     IDENTIFICATION  Patient Name: [name] Birthdate: [birth year] Sex: female Admission Date (Current Location): 12/12/2016  [facility] and [place] Number:  Herbalist and Address:  The . [facility], [place] [address], [place], [place] [address]      Provider Number: [id number]  Attending Physician Name and Address:  Bonnielee Haff, MD  Relative Name and Phone Number:       Current Level of Care: Hospital Recommended Level of Care: [place] Prior Approval Number:    Date Approved/Denied: 12/23/16 PASRR Number:    Discharge Plan: SNF    Current Diagnoses: Patient Active Problem List   Diagnosis Date Noted  . Ileus ([place])   . Pneumothorax   . Pneumonia of left upper lobe due to Streptococcus pneumoniae ([place])   . Enteritis due to Clostridium difficile   . Acute kidney injury ([place])   . HCAP (healthcare-associated pneumonia)   . Transaminitis   . Pressure injury of skin 12/19/2016  . Pneumonia with cavity of lung 12/12/2016  . Hyponatremia 04/14/2016  . Opiate withdrawal ([place]) 04/14/2016  . MDD (major depressive disorder), recurrent severe, without psychosis ([place]) 12/22/2015  . Leg edema 12/13/2015  . Acute CHF (congestive heart failure) ([place]) 12/13/2015  . Acute respiratory failure with hypoxia ([place]) 12/13/2015  . IBS (irritable bowel syndrome) 12/13/2015  . Abdominal pain 12/13/2015  . Suicidal ideation 12/13/2015  . CAP (community acquired pneumonia) 12/13/2015  . SOB (shortness of breath) 12/13/2015  . Dysuria 12/13/2015  . COPD with acute exacerbation ([place])   . Back pain   . Hypertension   . Arthritis   . Bipolar disorder ([place])   . Depression   . Essential hypertension   . Encephalopathy acute 12/03/2015  . Leukocytosis 12/03/2015  . Sinus tachycardia 11/24/2015  . Chronic narcotic dependence ([place]) 11/22/2015  . Closed fracture of multiple ribs of right side  11/22/2015  . Hypercalcemia 11/22/2015  . Chronic hyponatremia 03/07/2015  . Atypical chest pain 01/09/2015  . Centrilobular emphysema ([place]) 01/09/2015  . Cigarette nicotine dependence without complication 53/29/9242  . Fibromyalgia syndrome 01/09/2015  . Mixed hyperlipidemia 01/09/2015  . Chronic low back pain 03/06/2014    Orientation RESPIRATION BLADDER Height & Weight     Self, Time, Situation, Place  O2 (Nasal Cannula, 4L) Indwelling catheter, Incontinent (Urethal catheter; straight tub, placed on 1/20, tub size 14 Fr., balloon size 50m.) Weight: 162 lb 14.7 oz (73.9 kg) Height:  5' 4" (162.6 cm)  BEHAVIORAL SYMPTOMS/MOOD NEUROLOGICAL BOWEL NUTRITION STATUS      Incontinent (Nasogastric tube placed in right nare on 1/29. Rectal tub with balloon placed on 1/30.)  (Please see d/c summary)  AMBULATORY STATUS COMMUNICATION OF NEEDS Skin   Extensive Assist Verbally PU Stage and Appropriate Care (Pressure injury, unstagable, upper lip.)                       Personal Care Assistance Level of Assistance  Bathing, Feeding, Dressing Bathing Assistance: Maximum assistance Feeding assistance: Limited assistance Dressing Assistance: Maximum assistance     Functional Limitations Info  Sight, Hearing, Speech Sight Info: Adequate Hearing Info: Adequate Speech Info: Adequate    SPECIAL CARE FACTORS FREQUENCY  PT (By licensed PT), OT (By licensed OT)     PT Frequency: 3x week OT Frequency: 3x week            Contractures Contractures Info: Not present  Additional Factors Info  Code Status, Allergies, Isolation Precautions Code Status Info: Full Code Allergies Info: Cymbalta Duloxetine Hcl, Mucinex Guaifenesin Er, Nyquil Multi-symptom Pseudoeph-doxylamine-dm-apap, Paxil Paroxetine Hcl, Promethazine, Tramadol, Wellbutrin Bupropion, Zoloft Sertraline     Isolation Precautions Info: Enteric precautions (UV disinfection)     Current Medications (12/23/2016):  This is  the current hospital active medication list Current Facility-Administered Medications  Medication Dose Route Frequency Provider Last Rate Last Dose  .  stroke: mapping our early stages of recovery book   Does not apply Once Milagros Loll, MD      . 0.9 %  sodium chloride infusion  250 mL Intravenous PRN Milagros Loll, MD      . albuterol (PROVENTIL) (2.5 MG/3ML) 0.083% nebulizer solution 2.5 mg  2.5 mg Nebulization Q2H PRN Alexa Angela Burke, MD      . Ampicillin-Sulbactam (UNASYN) 3 g in sodium chloride 0.9 % 100 mL IVPB  3 g Intravenous Q6H Praveen Mannam, MD   3 g at 12/23/16 0647  . aspirin suppository 300 mg  300 mg Rectal Daily Milagros Loll, MD       Or  . aspirin tablet 325 mg  325 mg Per Tube Daily Milagros Loll, MD   325 mg at 12/22/16 1104  . budesonide (PULMICORT) nebulizer solution 0.25 mg  0.25 mg Nebulization BID Milagros Loll, MD   0.25 mg at 12/23/16 7681  . chlorhexidine gluconate (MEDLINE KIT) (PERIDEX) 0.12 % solution 15 mL  15 mL Mouth Rinse BID Milagros Loll, MD   15 mL at 12/23/16 1037  . dextrose 5 % 1,000 mL with potassium chloride 40 mEq infusion   Intravenous Continuous Alexa R Burns, MD 100 mL/hr at 12/23/16 0138    . heparin injection 5,000 Units  5,000 Units Subcutaneous Q8H Praveen Mannam, MD   5,000 Units at 12/23/16 0533  . hydrALAZINE (APRESOLINE) injection 10 mg  10 mg Intravenous Q4H PRN Lorella Nimrod, MD   10 mg at 12/21/16 1823  . insulin aspart (novoLOG) injection 0-9 Units  0-9 Units Subcutaneous Q4H Guy Begin, MD   1 Units at 12/23/16 1038  . ipratropium-albuterol (DUONEB) 0.5-2.5 (3) MG/3ML nebulizer solution 3 mL  3 mL Nebulization Q6H Alexa Angela Burke, MD   3 mL at 12/23/16 0728  . ketorolac (TORADOL) 15 MG/ML injection 15 mg  15 mg Intravenous Q8H PRN Velna Ochs, MD      . levalbuterol Penne Lash) nebulizer solution 1.25 mg  1.25 mg Nebulization Once Jeryl Columbia, NP      . lidocaine (XYLOCAINE) 2 % viscous mouth solution 15  mL  15 mL Mouth/Throat Q4H PRN Milagros Loll, MD   15 mL at 12/21/16 1124  . MEDLINE mouth rinse  15 mL Mouth Rinse QID Milagros Loll, MD   15 mL at 12/23/16 0500  . metoprolol (LOPRESSOR) injection 5 mg  5 mg Intravenous Q6H Alexa R Quay Burow, MD   5 mg at 12/23/16 0533  . metroNIDAZOLE (FLAGYL) IVPB 500 mg  500 mg Intravenous Q8H Velna Ochs, MD   500 mg at 12/23/16 1037  . [place]   Oral PRN Bonnielee Haff, MD      . vancomycin (VANCOCIN) 500 mg in sodium chloride irrigation 0.9 % 100 mL ENEMA  500 mg Rectal Q6H Alexa Angela Burke, MD   500 mg at 12/23/16 0532  . white petrolatum (VASELINE) gel   Topical PRN Brand Males, MD  Discharge Medications: Please see discharge summary for a list of discharge medications.  Relevant Imaging Results:  Relevant Lab Results:   Additional Information SSN: [id number]  Alla German, LCSW

## 2016-12-24 ENCOUNTER — Inpatient Hospital Stay (HOSPITAL_COMMUNITY): Payer: Medicaid Other

## 2016-12-24 DIAGNOSIS — N179 Acute kidney failure, unspecified: Secondary | ICD-10-CM

## 2016-12-24 DIAGNOSIS — R140 Abdominal distension (gaseous): Secondary | ICD-10-CM

## 2016-12-24 LAB — GLUCOSE, CAPILLARY
GLUCOSE-CAPILLARY: 96 mg/dL (ref 65–99)
Glucose-Capillary: 117 mg/dL — ABNORMAL HIGH (ref 65–99)
Glucose-Capillary: 127 mg/dL — ABNORMAL HIGH (ref 65–99)
Glucose-Capillary: 101 mg/dL — ABNORMAL HIGH (ref 65–99)
Glucose-Capillary: 108 mg/dL — ABNORMAL HIGH (ref 65–99)
Glucose-Capillary: 110 mg/dL — ABNORMAL HIGH (ref 65–99)

## 2016-12-24 LAB — CBC
HCT: 32.8 % — ABNORMAL LOW (ref 36.0–46.0)
Hemoglobin: 10.2 g/dL — ABNORMAL LOW (ref 12.0–15.0)
MCH: 28.2 pg (ref 26.0–34.0)
MCHC: 31.1 g/dL (ref 30.0–36.0)
MCV: 90.6 fL (ref 78.0–100.0)
PLATELETS: 664 10*3/uL — AB (ref 150–400)
RBC: 3.62 MIL/uL — AB (ref 3.87–5.11)
RDW: 16 % — AB (ref 11.5–15.5)
WBC: 24.1 10*3/uL — AB (ref 4.0–10.5)

## 2016-12-24 LAB — BLOOD GAS, ARTERIAL
Acid-base deficit: 0.3 mmol/L (ref 0.0–2.0)
BICARBONATE: 23 mmol/L (ref 20.0–28.0)
DRAWN BY: 441371
O2 Content: 3 L/min
O2 Saturation: 96.6 %
PCO2 ART: 31.8 mmHg — AB (ref 32.0–48.0)
PH ART: 7.472 — AB (ref 7.350–7.450)
PO2 ART: 81.3 mmHg — AB (ref 83.0–108.0)
Patient temperature: 98.6

## 2016-12-24 LAB — BASIC METABOLIC PANEL
ANION GAP: 8 (ref 5–15)
BUN: 6 mg/dL (ref 6–20)
CALCIUM: 8.7 mg/dL — AB (ref 8.9–10.3)
CO2: 22 mmol/L (ref 22–32)
Chloride: 109 mmol/L (ref 101–111)
Creatinine, Ser: 0.44 mg/dL (ref 0.44–1.00)
Glucose, Bld: 122 mg/dL — ABNORMAL HIGH (ref 65–99)
POTASSIUM: 3.2 mmol/L — AB (ref 3.5–5.1)
SODIUM: 139 mmol/L (ref 135–145)

## 2016-12-24 MED ORDER — FUROSEMIDE 10 MG/ML IJ SOLN
40.0000 mg | Freq: Once | INTRAMUSCULAR | Status: AC
  Administered 2016-12-24: 40 mg via INTRAVENOUS
  Filled 2016-12-24: qty 4

## 2016-12-24 MED ORDER — SODIUM CHLORIDE 0.9 % IV SOLN
30.0000 meq | Freq: Once | INTRAVENOUS | Status: AC
  Administered 2016-12-24: 30 meq via INTRAVENOUS
  Filled 2016-12-24: qty 15

## 2016-12-24 MED ORDER — ALBUTEROL SULFATE (2.5 MG/3ML) 0.083% IN NEBU
2.5000 mg | INHALATION_SOLUTION | RESPIRATORY_TRACT | Status: DC | PRN
  Administered 2016-12-24: 2.5 mg via RESPIRATORY_TRACT
  Filled 2016-12-24: qty 3

## 2016-12-24 MED ORDER — POTASSIUM CHLORIDE 20 MEQ/15ML (10%) PO SOLN: 40.0000 meq | Freq: Once | ORAL | Status: DC

## 2016-12-24 MED ORDER — KCL IN DEXTROSE-NACL 40-5-0.9 MEQ/L-%-% IV SOLN
INTRAVENOUS | Status: DC
  Administered 2016-12-24: 60 mL via INTRAVENOUS
  Administered 2016-12-25: 11:00:00 via INTRAVENOUS
  Filled 2016-12-24 (×4): qty 1000

## 2016-12-24 MED ORDER — ALBUTEROL SULFATE (2.5 MG/3ML) 0.083% IN NEBU
2.5000 mg | INHALATION_SOLUTION | RESPIRATORY_TRACT | Status: DC | PRN
  Administered 2016-12-24 – 2016-12-26 (×3): 2.5 mg via RESPIRATORY_TRACT
  Filled 2016-12-24 (×3): qty 3

## 2016-12-24 NOTE — Progress Notes (Signed)
SLP Cancellation Note  Patient Details Name: [name] LIV[facility] MRN: [id number] DOB: 10/27/1966   Cancelled treatment:       Reason Eval/Treat Not Completed: Medical issues which prohibited therapy. Awaiting readiness for PO intake per surgery.    Aarsh Fristoe, Katherene Ponto 12/24/2016, 1:27 PM

## 2016-12-24 NOTE — Progress Notes (Signed)
[facility] TEAM 1 - Stepdown/ICU TEAM  Amaani S Scholze  MRN:[id number] DOB: 03/01/1966 DOA: 12/12/2016 PCP: ARVIND,MOOGALI M, MD    Brief Narrative:  51-year-old female with a history of COPD/asthma and bipolar disorder who was admitted to the hospital with shortness of breath. She was found to have pneumonia. She had to be intubated for worsening respiratory failure. She was admitted to the intensive care unit. Extubated and transferred to the floor. Noted to have loose stools w/ abdominal distention. During this hospitalization she was also found to have an acute stroke. Dysphagia has been an issue. Patient subsequently developed diarrhea and ileus. Concern for C. difficile. On the night of 1/29 she became short of breath thought to be due to abdominal distention. NG tube was placed. She stabilized.  Subjective: The patient is resting quietly in bed.  She is somewhat slow to respond to my questions but does so.  She denies chest pain fevers chills nausea or vomiting.  In fact she claims there is nothing wrong with her.  She is able to tell me where she is and what year it is presently but does not appear to fully comprehend her circumstances.  Assessment & Plan:  C. Difficile colitis / ileus CT scan of the abdomen and pelvis showed colonic ileus - C. difficile toxigenic PCR + > being treated for the same - General Surgery following the ileus - tolerating sips of clears at this time   Acute hypoxic respiratory failure secondary to Multilobar PNA with small pneumothorax CT scan incidentally showed a pneumothorax. CT chest confirmed a small pneumothorax. Possibly related to the left-sided lung cavity. PCCM following and addressing this issue.   Multilobar Strep pneumoniae pneumonia with cavitary lesion Cultures from tracheal aspirate were positive for Strep pneumo. Per PCCM she needs Unasyn for a minimum of 3 weeks. CT scan to be done around 2-3 weeks from now to monitor cavitary lesion. HIV  nonreactive. Abx tx and O2 support continue.     Demand ischemia with history of grade 2 diastolic dysfunction Troponin was elevated up to 1.25. Subsequently started trending down. Patient is on a beta blocker. TTE w/ no wall motion abnormalities. May benefit from outpatient cardiology evaluation.  Filed Weights   12/19/16 0500 12/20/16 0500 12/21/16 0352  Weight: 76.3 kg (168 lb 3.2 oz) 72.2 kg (159 lb 2.8 oz) 73.9 kg (162 lb 14.7 oz)    AKI Resolved w/ IV fluid - creatinine was elevated up to 3.46  Recent Labs Lab 12/21/16 1947 12/22/16 0501 12/22/16 2324 12/23/16 0222 12/24/16 0429  CREATININE 0.54 0.60 0.55 0.57 0.44    Acute asthma exacerbation Appears to be resolving. Now off steroids.    Hypokalemia Continue to replace with goal of 4.0  Ischemic hepatitis At admission AST was 2024 and ALT was 1366. Most likely due to shock liver. Hepatitis panel negative. LFTs have improved.  Recheck in a.m.  Dysphagia Speech therapy is following. She underwent modified barium swallow on 1/29 and was cleared for dysphagia 1 diet.  Continue sips of clear liquids and follow ileus  Acute Anemia Hgb 10.4 on admission. Baseline around 12.  Stable at this time.  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 Tx w/ course of acyclovir.  Acute encephalopathy Remains confused. Distracted. Continue to monitor for now.   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 Patient was seen by Neurology. Stroke thought to be secondary to acute illness. Aspirin recommended. Secondary risk reduction.   Bipolar d/o Continue seroquel, lexapro, and lamotrigine    DVT prophylaxis: SQ heparin Code Status: FULL CODE Family Communication: no family present at time of exam  Disposition Plan: SDU   Consultants:  PCCM Gen Surgery   Procedures: none  Antimicrobials:  Ceftriaxone 1/19 >> 1/24 Azithromycin 1/19 >> 1/22 Acyclovir 1/22 > 1/28 Unasyn 1/24 > Flagyl 1/29 > Vanc 1/29 >  Objective: Blood  pressure (!) 162/96, pulse (!) 122, temperature 100.1 F (37.8 C), temperature source Axillary, resp. rate (!) 27, height 5' 4" (1.626 m), weight 73.9 kg (162 lb 14.7 oz), SpO2 95 %.  Intake/Output Summary (Last 24 hours) at 12/24/16 1540 Last data filed at 12/24/16 1303  Gross per 24 hour  Intake             3200 ml  Output             3550 ml  Net             -350 ml   Filed Weights   12/19/16 0500 12/20/16 0500 12/21/16 0352  Weight: 76.3 kg (168 lb 3.2 oz) 72.2 kg (159 lb 2.8 oz) 73.9 kg (162 lb 14.7 oz)    Examination: General: No acute respiratory distress Lungs: Clear to auscultation bilaterally without wheezes or crackles Cardiovascular: Regular rate and rhythm without murmur gallop or rub normal S1 and S2 Abdomen: Not signif distended, soft, bowel sounds hypoactive, no rebound, no ascites, no appreciable mass Extremities: No significant cyanosis, clubbing, or edema bilateral lower extremities  CBC:  Recent Labs Lab 12/21/16 0227 12/22/16 0501 12/22/16 2324 12/23/16 0222 12/24/16 0429  WBC 22.1* 18.0* 24.3* 25.2* 24.1*  NEUTROABS 18.6*  --   --   --   --   HGB 10.9* 11.1* 11.0* 10.4* 10.2*  HCT 35.5* 35.3* 35.3* 33.7* 32.8*  MCV 93.7 93.9 92.7 93.4 90.6  PLT 483* 453* 532* 551* 132*   Basic Metabolic Panel:  Recent Labs Lab 12/18/16 0336 12/19/16 0207  12/21/16 0821 12/21/16 1947 12/22/16 0501 12/22/16 2324 12/23/16 0222 12/24/16 0429  NA 149* 150*  < >  --  149* 147* 143 145 139  K 4.2 3.7  < >  --  3.9 5.1 3.6 3.5 3.2*  CL 105 108  < >  --  111 113* 109 109 109  CO2 34* 31  < >  --  _0 GLUCOSE 167* 177*  < >  --  120* 100* 109* 108* 122*  BUN 21* 21*  < >  --  _1 CREATININE 0.49 0.53  < >  --  0.54 0.60 0.55 0.57 0.44  CALCIUM 8.8* 8.8*  < >  --  9.2 9.1 8.9 8.9 8.7*  MG 2.1 2.1  --  2.1  --  2.3  --  1.8  --   PHOS 3.4 2.9  --  2.9  --  3.3  --   --   --   < > = values in this interval not displayed. GFR: Estimated  Creatinine Clearance: 82 mL/min (by C-G formula based on SCr of 0.44 mg/dL).  Liver Function Tests:  Recent Labs Lab 12/20/16 0314 12/22/16 2324 12/23/16 0222  AST _2 ALT 123* 67* 63*  ALKPHOS 81 75 71  BILITOT 0.4 0.7 0.7  PROT 5.7* 5.6* 5.3*  ALBUMIN 2.6* 3.0* 2.7*    HbA1C: Hgb A1c MFr Bld  Date/Time Value Ref Range Status  12/13/2016 06:13 AM 5.4 4.8 - 5.6 % Final    Comment:    (  NOTE)         Pre-diabetes: 5.7 - 6.4         Diabetes: >6.4         Glycemic control for adults with diabetes: <7.0   12/13/2015 01:00 AM 6.2 (H) 4.8 - 5.6 % Final    Comment:    (NOTE)         Pre-diabetes: 5.7 - 6.4         Diabetes: >6.4         Glycemic control for adults with diabetes: <7.0     CBG:  Recent Labs Lab 12/23/16 1955 12/23/16 2318 12/24/16 0312 12/24/16 0829 12/24/16 1240  GLUCAP 113* 114* 117* 127* 101*    Recent Results (from the past 240 hour(s))  Hsv Culture And Typing     Status: Abnormal   Collection Time: 12/15/16  3:23 PM  Result Value Ref Range Status   HSV Culture/Type Comment (A)  Final    Comment: (NOTE) Positive for Herpes simplex virus type-1. Typing was confirmed by monoclonal antibody microscopic immunofluorescence. Performed At: [facility] [address] [place], [place] [id number] [place] William F MD Ph:[phone]    Source of Sample ORAL  Final  Culture, blood (routine x 2)     Status: None   Collection Time: 12/17/16  4:58 PM  Result Value Ref Range Status   Specimen Description BLOOD RIGHT ARM  Final   Special Requests IN PEDIATRIC BOTTLE 2CC  Final   Culture NO GROWTH 5 DAYS  Final   Report Status 12/22/2016 FINAL  Final  Culture, blood (routine x 2)     Status: None   Collection Time: 12/17/16  5:05 PM  Result Value Ref Range Status   Specimen Description BLOOD RIGHT HAND  Final   Special Requests IN PEDIATRIC BOTTLE 3CC  Final   Culture NO GROWTH 5 DAYS  Final   Report Status 12/22/2016 FINAL  Final  C  difficile quick scan w PCR reflex     Status: Abnormal   Collection Time: 12/21/16  6:25 PM  Result Value Ref Range Status   C Diff antigen POSITIVE (A) NEGATIVE Final   C Diff toxin NEGATIVE NEGATIVE Final   C Diff interpretation Results are indeterminate. See PCR results.  Final  Clostridium Difficile by PCR     Status: Abnormal   Collection Time: 12/21/16  6:25 PM  Result Value Ref Range Status   Toxigenic C Difficile by pcr POSITIVE (A) NEGATIVE Final    Comment: Positive for toxigenic C. difficile with little to no toxin production. Only treat if clinical presentation suggests symptomatic illness.     Scheduled Meds: .  stroke: mapping our early stages of recovery book   Does not apply Once  . ampicillin-sulbactam (UNASYN) IV  3 g Intravenous Q6H  . aspirin  300 mg Rectal Daily   Or  . aspirin  325 mg Per Tube Daily  . budesonide (PULMICORT) nebulizer solution  0.5 mg Nebulization BID  . chlorhexidine gluconate (MEDLINE KIT)  15 mL Mouth Rinse BID  . heparin subcutaneous  5,000 Units Subcutaneous Q8H  . insulin aspart  0-9 Units Subcutaneous Q4H  . ipratropium-albuterol  3 mL Nebulization Q6H  . levalbuterol  1.25 mg Nebulization Once  . mouth rinse  15 mL Mouth Rinse QID  . metoprolol  5 mg Intravenous Q6H  . metronidazole  500 mg Intravenous Q8H  . vancomycin (VANCOCIN) rectal ENEMA  500 mg Rectal Q6H    LOS:   12 days   Cherene Altes, MD [facility] Office  [phone] Pager - Text Page per Amion as per below:  On-Call/Text Page:      [email or web address]      password [email or web address]  If 7PM-7AM, please contact night-coverage [email or web address] Password [email or web address] 12/24/2016, 3:40 PM

## 2016-12-24 NOTE — Progress Notes (Signed)
[facility] Progress Note     Subjective: Pt states no abdominal pain at rest. Nurse states no known vomiting overnight. Pt states no nausea.   Objective: Vital signs in last 24 hours: Temp:  [97.8 F (36.6 C)-99.7 F (37.6 C)] 98.7 F (37.1 C) (01/31 0747) Pulse Rate:  [110-127] 121 (01/31 0747) Resp:  [29-35] 35 (01/31 0747) BP: (164-180)/(96-105) 169/102 (01/31 0747) SpO2:  [93 %-99 %] 95 % (01/31 0747) Last BM Date: 12/23/16  Intake/Output from previous day: 01/30 0701 - 01/31 0700 In: 2200 [I.V.:2200] Out: 2575 [Urine:1175; Emesis/NG output:600; Stool:800] Intake/Output this shift: Total I/O In: -  Out: 200 [Urine:200]  PE: Gen:  Alert, NAD, sleeping but easily aroused.  HENT: Lips dry  Eyes: Conjunctivae are normal. Right eye exhibits no discharge. Left eye exhibits no discharge.  Cardiovascular: Regular rhythm, normal heart sounds.  Tachycardia present.  No murmur heard. Pulmonary/Chest: Effort normal. Tachypnea noted. No respiratory distress. Rhonchi and diffuse expiratory wheezes noted anteriorly.  Abdominal: Soft. Bowel sounds are normal. mild distension. There is guarding. Tympanic. Moderate generalized TTP worse on the left side  Neurological: She is alert.  Skin: Skin is warm. No rash noted. She is not diaphoretic.   Nursing note and vitals reviewed.   Lab Results:   Recent Labs  12/23/16 0222 12/24/16 0429  WBC 25.2* 24.1*  HGB 10.4* 10.2*  HCT 33.7* 32.8*  PLT 551* 664*   BMET  Recent Labs  12/23/16 0222 12/24/16 0429  NA 145 139  K 3.5 3.2*  CL 109 109  CO2 25 22  GLUCOSE 108* 122*  BUN 12 6  CREATININE 0.57 0.44  CALCIUM 8.9 8.7*   PT/INR No results for input(s): LABPROT, INR in the last 72 hours. CMP     Component Value Date/Time   NA 139 12/24/2016 0429   K 3.2 (L) 12/24/2016 0429   CL 109 12/24/2016 0429   CO2 22 12/24/2016 0429   GLUCOSE 122 (H) 12/24/2016 0429   BUN 6 12/24/2016 0429   CREATININE 0.44  12/24/2016 0429   CALCIUM 8.7 (L) 12/24/2016 0429   PROT 5.3 (L) 12/23/2016 0222   ALBUMIN 2.7 (L) 12/23/2016 0222   AST 26 12/23/2016 0222   ALT 63 (H) 12/23/2016 0222   ALKPHOS 71 12/23/2016 0222   BILITOT 0.7 12/23/2016 0222   GFRNONAA >60 12/24/2016 0429   GFRAA >60 12/24/2016 0429   Lipase     Component Value Date/Time   LIPASE 12 12/12/2015 1714       Studies/Results: Ct Chest Wo Contrast  Result Date: 12/22/2016 CLINICAL DATA:  Evaluate pneumothorax seen on CT of the abdomen and pelvis. Initial encounter. EXAM: CT CHEST WITHOUT CONTRAST TECHNIQUE: Multidetector CT imaging of the chest was performed following the standard protocol without IV contrast. COMPARISON:  CT of the abdomen and pelvis performed earlier today at 3:40 p.m., and CT of the chest performed 12/16/2016 FINDINGS: Cardiovascular: The heart is normal in size. Minimal calcification is seen along the aortic arch. The great vessels are grossly unremarkable in appearance. Mediastinum/Nodes: The mediastinum is unremarkable in appearance. No mediastinal lymphadenopathy is seen. No pericardial effusion is identified. The visualized portions of the thyroid gland are unremarkable. No axillary lymphadenopathy is seen. An apparent enteric tube is noted ending at the proximal esophagus. Lungs/Pleura: There has been interval improvement in patchy airspace opacity within both lungs, likely reflecting improving infectious process. Mild residual hazy airspace opacities are seen particularly at the right middle lobe and left lingula.  A large cavitary lesion is again noted at the periphery of the left upper lobe, relatively stable in size, measuring approximately 6.4 x 3.8 cm. The degree of wall thickening has decreased from the prior study, while the amount of fluid within the cavitary lesion has increased. This suggests strongly against malignancy, and is suspicious for a lung abscess. The previously noted small 1 cm minimally cavitary  nodule at the left lower lobe has resolved. Left basilar atelectasis is noted. No definite pleural effusion is seen. A tiny left anterior pneumothorax is noted, localized to the anterior left lung base adjacent to the mediastinum, as seen on CT of the abdomen and pelvis, but new from the prior CT of the chest. Upper Abdomen: The visualized portions of the liver and spleen are grossly unremarkable. The patient is status post cholecystectomy, with clips noted along the gallbladder fossa. Mild soft tissue inflammation and fluid tracks inferiorly along Gerota's fascia bilaterally. The visualized portions of the pancreas, adrenal glands and kidneys are within normal limits. Musculoskeletal: No acute osseous abnormalities are identified. Chronic partially healed bilateral rib fractures are noted. The visualized musculature is unremarkable in appearance. IMPRESSION: 1. Tiny left anterior pneumothorax, localized to the anterior left lung base adjacent to the mediastinum, as seen on CT of the abdomen and pelvis. 2. Large cavitary lesion again noted at the periphery of the left upper lung lobe, relatively stable in size, measuring 6.4 x 3.8 cm. The degree of wall thickening has decreased from the prior study, while the amount of fluid within the cavitary lesion has increased. This suggests strongly against malignancy, and is suspicious for a lung abscess. 3. Previously noted small 1 cm cavitary nodule at the left lower lobe has resolved. Left basilar atelectasis noted. 4. Interval improvement in patchy airspace opacity within both lungs, likely reflecting improving infectious process. Mild residual hazy opacities at the right middle lobe and left lingula. 5. Apparent enteric tube noted ending at the proximal esophagus. Would correlate clinically. 6. Mild soft tissue inflammation and fluid tracks inferiorly along Gerota's fascia bilaterally. 7. Chronic partially healed bilateral rib fractures noted. Electronically Signed   By:  Garald Balding M.D.   On: 12/22/2016 17:51   Ct Abdomen Pelvis W Contrast  Result Date: 12/22/2016 CLINICAL DATA:  Abdominal distension.  Evaluate ileus. EXAM: CT ABDOMEN AND PELVIS WITH CONTRAST TECHNIQUE: Multidetector CT imaging of the abdomen and pelvis was performed using the standard protocol following bolus administration of intravenous contrast. CONTRAST:  157mL ISOVUE-300 IOPAMIDOL (ISOVUE-300) INJECTION 61% COMPARISON:  Abdominal x-ray dated 12/20/2016. CT chest/abdomen dated 12/16/2016 FINDINGS: Lower chest: Small pneumothorax at the left lung base anteriorly. Incompletely imaged cavitary mass/consolidation within the inferior aspects of the left upper lobe and/or lingula, as previously described on earlier chest CT. Hepatobiliary: Status post cholecystectomy. Liver appears normal. No bile duct dilatation. Pancreas: Unremarkable. Spleen: Normal in size without focal abnormality. Adrenals/Urinary Tract: Adrenal glands appear normal. Kidneys appear normal without mass, stone or hydronephrosis. Bladder is decompressed by Foley catheter. Stomach/Bowel: There is moderate distention of the transverse colon and right colon containing gas and fluid/stool, measuring 6.6 cm diameter and 7.5 cm diameter respectively. Rectosigmoid colon and descending colon are normal in caliber. Small bowel is normal in caliber. Enteric tube well positioned within the decompressed stomach. Vascular/Lymphatic: Scattered atherosclerotic changes of the normal caliber abdominal aorta. No enlarged lymph nodes seen in the abdomen or pelvis. Reproductive: Unremarkable. Other: Small amount of free fluid in the lower pelvis. Trace free fluid/edema within the upper  paracolic gutter regions bilaterally. No abscess collections seen. No free intraperitoneal air. Musculoskeletal: Degenerative changes throughout the slightly scoliotic thoracolumbar spine, mild in degree. No acute or suspicious osseous finding. Multiple old rib fractures  bilaterally. IMPRESSION: 1. Moderate distension of the transverse colon and right colon, containing gas and fluid/stool, measuring 6.6 cm diameter and 7.5 cm diameter respectively, most compatible with the given history of ileus as there is no transition point to suggest mechanical large bowel obstruction. 2. Pneumothorax at the left lung base anteriorly, incompletely imaged, perhaps related to a interval surgical/diagnostic intervention for the previously described cavitary mass/consolidation within the left lung. If no recent surgical/diagnostic intervention to explain this incompletely imaged pneumothorax at the left lung base, recommend chest CT for complete characterization. 3. Small amount of free fluid in the abdomen and pelvis. No abscess collection seen. No free intraperitoneal air. 4. Aortic atherosclerosis. These results were called by telephone at the time of interpretation on 12/22/2016 at 4:14 pm to Dr. Bonnielee Haff , who verbally acknowledged these results. Electronically Signed   By: Franki Cabot M.D.   On: 12/22/2016 16:15   Dg Chest Port 1 View  Result Date: 12/23/2016 CLINICAL DATA:  Shortness of breath. EXAM: PORTABLE CHEST 1 VIEW COMPARISON:  Radiograph of December 22, 2016. FINDINGS: Stable cardiomediastinal silhouette. No pneumothorax is noted. Mild central pulmonary vascular congestion is again noted. Stable mild loculated left pleural effusion is noted. Old right rib fractures are noted. IMPRESSION: Stable cardiomediastinal silhouette with mild central pulmonary vascular congestion. Stable mild loculated left pleural effusion. Electronically Signed   By: Marijo Conception, M.D.   On: 12/23/2016 14:09   Dg Chest Port 1 View  Result Date: 12/22/2016 CLINICAL DATA:  Acute hypoxic respiratory failure. Streptococcal pneumonia. EXAM: PORTABLE CHEST 1 VIEW COMPARISON:  12/20/2016, 12/21/2016, 12/22/2016 FINDINGS: Cavitary lesion in the left lung appears to contain more fluid today, as  observed on accompanying CT. No other significant interval change. The small left pneumothorax might be marginally visible in the apex. No significant pneumothorax. Right lung is clear. Hilar and mediastinal contours are unremarkable and unchanged. IMPRESSION: Air-fluid level within the left cavitary lesion. No significant pneumothorax. Trace pneumothorax may be visible in the left apex, also seen on CT. Electronically Signed   By: Andreas Newport M.D.   On: 12/22/2016 22:02   Dg Abd Portable 1v  Result Date: 12/23/2016 CLINICAL DATA:  Ileus, pneumonia EXAM: PORTABLE ABDOMEN - 1 VIEW COMPARISON:  12/23/2016 FINDINGS: Mild gaseous distention of bowel, slightly improved since prior study. Oral contrast material noted within the appendix and right colon. No free air organomegaly. Prior cholecystectomy. IMPRESSION: Some improvement in diffuse gaseous distention of bowel. Electronically Signed   By: Rolm Baptise M.D.   On: 12/23/2016 11:21   Dg Abd Portable 1v  Result Date: 12/23/2016 CLINICAL DATA:  Nasogastric tube placement.  Initial encounter. EXAM: PORTABLE ABDOMEN - 1 VIEW COMPARISON:  CT of the abdomen pelvis performed 12/22/2016 FINDINGS: The patient's enteric tube is noted ending overlying the body of the stomach. Air and contrast filled dilated loops of colon are again noted, likely reflecting ileus. There is no definite evidence for bowel obstruction. No free intra-abdominal air is seen, though evaluation for free air is noted on a single supine view. No acute osseous abnormalities are seen. The patient's left-sided cavitary lung lesion is partially imaged. IMPRESSION: 1. Enteric tube noted ending overlying the body of the stomach. 2. Air and contrast filled dilated loops of colon again noted, likely  reflecting ileus. No free intra-abdominal air seen. Electronically Signed   By: Garald Balding M.D.   On: 12/23/2016 00:43   Dg Swallowing Func-speech Pathology  Result Date: 12/22/2016 Objective  Swallowing Evaluation: Type of Study: MBS-Modified Barium Swallow Study Patient Details Name: [name] MRN: [id number] Date of Birth: 1966/01/09 Today's Date: 12/22/2016 Time: SLP Start Time (ACUTE ONLY): 0951-SLP Stop Time (ACUTE ONLY): 1012 SLP Time Calculation (min) (ACUTE ONLY): 21 min Past Medical History: Past Medical History: Diagnosis Date . Allergic rhinitis  . Arthritis  . Asthma  . Back pain  . Bipolar 1 disorder ([place])  . Chronic low back pain 03/06/2014 . COPD (chronic obstructive pulmonary disease) ([place])  . Depression  . Fibroid  . Hypertension  . IBS (irritable bowel syndrome)  . Migraine  . Scoliosis  Past Surgical History: Past Surgical History: Procedure Laterality Date . CHOLECYSTECTOMY   . DILATION AND CURETTAGE OF UTERUS   . EXPLORATORY LAPAROTOMY    WHEN PATIENT WAS YOUNG . RIGHT FOOT SURGERY   . TUBAL LIGATION Bilateral  HPI: 51 y/o F, 45 pk year smoker, with Hx of COPD/asthma and bipolar disorder admitted 1/19 with SOB. Working diagnosis of acute asthma exacerbation, multilobar PNA with resultant hypoxic respiratory failure. Intubated 12/12/16 to 12/20/16.  Subjective: alert for evaluation, requires cues to maintain alertness Assessment / Plan / Recommendation CHL IP CLINICAL IMPRESSIONS 12/22/2016 Therapy Diagnosis Mild pharyngeal phase dysphagia;Moderate pharyngeal phase dysphagia  Clinical Impression Ms. Dufault was cooperative but drowsy during the MBS and required moderate cueing to attend to the assessment and POs. Pt exhibits mild-moderate pharyngeal dysphagia characterized by delayed swallow initiation that results in silent penetration (able to clear with 2nd reflexive swallow) during nectar thick consistencies. Thicker textures were better contained in the valleculae before the swallow. Backflow from upper esophageal segment to pharynx also noted with nectar thick liquids. Due to prolonged intubation, fluctuating mentation/alertness, and hx of CVA, recommend diet of honey thick  liquids, pureed solids, meds whole in puree, with full supervision during mealtimes, small bites/sips, seated upright. ST will f/u with pt for treatment to assess safety/efficiency of swallowing function and possible diet upgrade.  Impact on safety and function Moderate aspiration risk   CHL IP TREATMENT RECOMMENDATION 12/22/2016 Treatment Recommendations Therapy as outlined in treatment plan below   Prognosis 12/22/2016 Prognosis for Safe Diet Advancement Good Barriers to Reach Goals -- Barriers/Prognosis Comment -- CHL IP DIET RECOMMENDATION 12/22/2016 SLP Diet Recommendations Dysphagia 1 (Puree) solids;Honey thick liquids Liquid Administration via Cup;No straw Medication Administration Whole meds with puree Compensations Slow rate;Small sips/bites Postural Changes Seated upright at 90 degrees;Remain semi-upright after after feeds/meals (Comment)   CHL IP OTHER RECOMMENDATIONS 12/22/2016 Recommended Consults -- Oral Care Recommendations Oral care BID Other Recommendations Order thickener from pharmacy;Prohibited food (jello, ice cream, thin soups);Remove water pitcher   CHL IP FOLLOW UP RECOMMENDATIONS 12/22/2016 Follow up Recommendations Skilled Nursing facility   [facility] IP FREQUENCY AND DURATION 12/22/2016 Speech Therapy Frequency (ACUTE ONLY) min 2x/week Treatment Duration 2 weeks      CHL IP ORAL PHASE 12/22/2016 Oral Phase WFL Oral - Pudding Teaspoon -- Oral - Pudding Cup -- Oral - Honey Teaspoon -- Oral - Honey Cup -- Oral - Nectar Teaspoon -- Oral - Nectar Cup -- Oral - Nectar Straw -- Oral - Thin Teaspoon -- Oral - Thin Cup -- Oral - Thin Straw -- Oral - Puree -- Oral - Mech Soft -- Oral - Regular -- Oral - Multi-Consistency -- Oral - Pill --  Oral Phase - Comment --  CHL IP PHARYNGEAL PHASE 12/22/2016 Pharyngeal Phase Impaired Pharyngeal- Pudding Teaspoon -- Pharyngeal -- Pharyngeal- Pudding Cup -- Pharyngeal -- Pharyngeal- Honey Teaspoon -- Pharyngeal -- Pharyngeal- Honey Cup Delayed swallow  initiation-vallecula;Other (Comment) Pharyngeal -- Pharyngeal- Nectar Teaspoon Delayed swallow initiation-pyriform sinuses;Penetration/Aspiration before swallow;Other (Comment) Pharyngeal Material enters airway, remains ABOVE vocal cords then ejected out Pharyngeal- Nectar Cup Delayed swallow initiation-pyriform sinuses;Other (Comment);Penetration/Aspiration before swallow Pharyngeal Material enters airway, remains ABOVE vocal cords then ejected out Pharyngeal- Nectar Straw -- Pharyngeal -- Pharyngeal- Thin Teaspoon -- Pharyngeal -- Pharyngeal- Thin Cup -- Pharyngeal -- Pharyngeal- Thin Straw -- Pharyngeal -- Pharyngeal- Puree Delayed swallow initiation-vallecula Pharyngeal -- Pharyngeal- Mechanical Soft -- Pharyngeal -- Pharyngeal- Regular -- Pharyngeal -- Pharyngeal- Multi-consistency -- Pharyngeal -- Pharyngeal- Pill -- Pharyngeal -- Pharyngeal Comment --  CHL IP CERVICAL ESOPHAGEAL PHASE 12/22/2016 Cervical Esophageal Phase Impaired Pudding Teaspoon -- Pudding Cup -- Honey Teaspoon -- Honey Cup -- Nectar Teaspoon -- Nectar Cup Esophageal backflow into the pharynx Nectar Straw -- Thin Teaspoon -- Thin Cup -- Thin Straw -- Puree -- Mechanical Soft -- Regular -- Multi-consistency -- Pill -- Cervical Esophageal Comment -- No flowsheet data found. Germain Osgood 12/22/2016, 11:25 AM  Note populated for Fransisca Kaufmann, student SLP Germain Osgood, M.A. CCC-SLP [phone]              Anti-infectives: Anti-infectives    Start     Dose/Rate Route Frequency Ordered Stop   12/23/16 0130  vancomycin (VANCOCIN) 500 mg in sodium chloride irrigation 0.9 % 100 mL ENEMA     500 mg Rectal Every 6 hours 12/23/16 0049     12/22/16 2354  metroNIDAZOLE (FLAGYL) IVPB 500 mg     500 mg 100 mL/hr over 60 Minutes Intravenous Every 8 hours 12/22/16 2354     12/22/16 2200  vancomycin (VANCOCIN) 50 mg/mL oral solution 125 mg  Status:  Discontinued     125 mg Oral 4 times daily 12/22/16 2058 12/23/16 0049   12/22/16 1000   vancomycin (VANCOCIN) 50 mg/mL oral solution 125 mg  Status:  Discontinued     125 mg Per Tube 4 times daily 12/22/16 0947 12/22/16 2058   12/17/16 1200  Ampicillin-Sulbactam (UNASYN) 3 g in sodium chloride 0.9 % 100 mL IVPB     3 g 200 mL/hr over 30 Minutes Intravenous Every 6 hours 12/17/16 1144     12/15/16 1015  acyclovir (ZOVIRAX) 200 MG/5ML suspension SUSP 400 mg     400 mg Per Tube 3 times daily 12/15/16 1000 12/22/16 0959   12/13/16 2000  azithromycin (ZITHROMAX) 500 mg in dextrose 5 % 250 mL IVPB  Status:  Discontinued     500 mg 250 mL/hr over 60 Minutes Intravenous Every 24 hours 12/12/16 2241 12/15/16 0957   12/13/16 2000  cefTRIAXone (ROCEPHIN) 1 g in dextrose 5 % 50 mL IVPB  Status:  Discontinued     1 g 100 mL/hr over 30 Minutes Intravenous Every 24 hours 12/12/16 2241 12/17/16 1144   12/12/16 2015  cefTRIAXone (ROCEPHIN) 1 g in dextrose 5 % 50 mL IVPB     1 g 100 mL/hr over 30 Minutes Intravenous  Once 12/12/16 2013 12/12/16 2110   12/12/16 2015  azithromycin (ZITHROMAX) 500 mg in dextrose 5 % 250 mL IVPB     500 mg 250 mL/hr over 60 Minutes Intravenous  Once 12/12/16 2013 12/12/16 2225       Assessment/Plan  Problem list Acute hypoxic respiratory failure secondary to  Multilobar PNAwith small pneumothorax Multilobar pneumonia with strep pneumonia with cavitary lesion - strep pneumo. Demand ischemia with history of grade 2 diastolic dysfunction AKI - Resolved Acute asthma exacerbation Ischemic hepatitis - At admission AST was 2024 and ALT was 1366. Most likely due to shock liver. Hepatitis panel negative. LFTs have improved. Dysphagia - modified barium swallow on 1/29, cleared for dysphagia 1 diet.  Leukocytosis Acute Anemia Mouth sores - acyclovir. Acute encephalopathy Acute CVA Bipolar d/o  Colonic Ileus with C. diff - pt having abdominal pain and bloating, imaging concerning for colonic ileus. - +C. Diff, concerns for developing into toxic megacolon. Abd  xray yesterday showed some improvement in bowel distention. - pt pulled NGT yesterday and is refusing another one.  - sips and chips as tolerated - receiving Vanc enemas through rectal tube, C. Diff treatment per primary  - - no surgical intervention is indicated at this time. Pending abd xray. We will continue to follow the pt   LOS: 12 days    Kalman Drape , [facility] 12/24/2016, 10:04 AM Pager: [phone] Consults: [phone] Mon-Fri 7:00 am-4:30 pm Sat-Sun 7:00 am-11:30 am

## 2016-12-24 NOTE — Progress Notes (Signed)
Name: [name] MRN: [id number] DOB: 10/14/1966    ADMISSION DATE:  12/12/2016 CONSULTATION DATE: 1/19  REFERRING MD : Alvino Chapel, MD   CHIEF COMPLAINT: Asthma Exacerbation   BRIEF PATIENT DESCRIPTION: 51 yo female with PMH asthma, COPD, and bipolar disorder.  Presented on 1/19 hypoxic with working diagnosis of acute asthma exacerbation, multilobar Streptococcus pneumoniae PNA with evidence of abscess formation within the left upper lobe with a small left-sided pneumothorax as well as C.Diff. Intubated 1/19-1/27.   1/28 PCCM was called to evaluate patient for increasing oxygen requirement from room air to 5L [place], worsening tachypnea, and tachycardia. CT abdomen revealed a colonic ileus without evidence of bowel obstruction.  SIGNIFICANT EVENTS  Admit / Intubate 1/19 Extubated 1/27  STUDIES:  EKG 1/19>> Sinus tachycardia, T wave inversion in inferior leads which are new compared to previous EKG, QTc 620 CXR 1/19>> Consolidations bilaterally CT head 1/19>> Left occipital lobe probable acute or early subacute infarction. No acute intracranial hemorrhage. 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 There is a small amount of fluid in this structure. Also a 1 cm nodular structure in the left lower lobe with minimal cavitation.  Abd xr 1/27>> bowel distended with gas  Cultures: Blood 1/19 >> Negative Respiratory 1/20 >> Strep Pneumo  RVP 1/20 >> Negative  Oral HSV 1/22 >> positive (completed acyclovir)  Blood 1/24 >> Negative  C diff 1/28 >> Antigen & PCR positive   Antibiotics: Azithromycin 1/19 - 1/21 Ceftriaxone 1/19 - 1/23 Acyclovir 1/22 - 1/27 Unasyn 1/24 >> Vanco 1/29 >>  SUBJECTIVE:  Agitated overnight, received 2 mg ativan, this am remains in bilateral wrist  restraints   VITAL SIGNS: Temp:  [97.8 F (36.6 C)-99.7 F (37.6 C)] 99.3 F (37.4 C) (01/31 0310) Pulse Rate:  [110-127] 127 (01/31 0310) Resp:  [29-34] 34 (01/31 0310) BP: (164-180)/(96-105) 175/105 (01/31 0310) SpO2:  [93 %-99 %] 93 % (01/31 0310)  PHYSICAL EXAMINATION: General: Adult female, lying in bed  Neuro: Lethargic, follows commands, moves extremities   HEENT: normocephalic  Cardiovascular: Tachy, no MRG, S1/S2 Lungs: expiratory wheezes, diminished to bases, labored breathing    Abdomen: distended, hypoactive bowel sounds, tender  Musculoskeletal: no acute deformities  Skin: warm, dry, intact     Recent Labs Lab 12/22/16 2324 12/23/16 0222 12/24/16 0429  NA 143 145 139  K 3.6 3.5 3.2*  CL 109 109 109  CO2 22 25 22   BUN 12 12 6   CREATININE 0.55 0.57 0.44  GLUCOSE 109* 108* 122*    Recent Labs Lab 12/22/16 2324 12/23/16 0222 12/24/16 0429  HGB 11.0* 10.4* 10.2*  HCT 35.3* 33.7* 32.8*  WBC 24.3* 25.2* 24.1*  PLT 532* 551* 664*   Ct Chest Wo Contrast  Result Date: 12/22/2016 CLINICAL DATA:  Evaluate pneumothorax seen on CT of the abdomen and pelvis. Initial encounter. EXAM: CT CHEST WITHOUT CONTRAST TECHNIQUE: Multidetector CT imaging of the chest was performed following the standard protocol without IV contrast. COMPARISON:  CT of the abdomen and pelvis performed earlier today at 3:40 p.m., and CT of the chest performed 12/16/2016 FINDINGS: Cardiovascular: The heart is normal in size. Minimal calcification is seen along the aortic arch. The great vessels are grossly unremarkable in appearance. Mediastinum/Nodes: The mediastinum is unremarkable in  appearance. No mediastinal lymphadenopathy is seen. No pericardial effusion is identified. The visualized portions of the thyroid gland are unremarkable. No axillary lymphadenopathy is seen. An apparent enteric tube is noted ending at the proximal esophagus. Lungs/Pleura: There has been interval improvement in patchy  airspace opacity within both lungs, likely reflecting improving infectious process. Mild residual hazy airspace opacities are seen particularly at the right middle lobe and left lingula. A large cavitary lesion is again noted at the periphery of the left upper lobe, relatively stable in size, measuring approximately 6.4 x 3.8 cm. The degree of wall thickening has decreased from the prior study, while the amount of fluid within the cavitary lesion has increased. This suggests strongly against malignancy, and is suspicious for a lung abscess. The previously noted small 1 cm minimally cavitary nodule at the left lower lobe has resolved. Left basilar atelectasis is noted. No definite pleural effusion is seen. A tiny left anterior pneumothorax is noted, localized to the anterior left lung base adjacent to the mediastinum, as seen on CT of the abdomen and pelvis, but new from the prior CT of the chest. Upper Abdomen: The visualized portions of the liver and spleen are grossly unremarkable. The patient is status post cholecystectomy, with clips noted along the gallbladder fossa. Mild soft tissue inflammation and fluid tracks inferiorly along Gerota's fascia bilaterally. The visualized portions of the pancreas, adrenal glands and kidneys are within normal limits. Musculoskeletal: No acute osseous abnormalities are identified. Chronic partially healed bilateral rib fractures are noted. The visualized musculature is unremarkable in appearance. IMPRESSION: 1. Tiny left anterior pneumothorax, localized to the anterior left lung base adjacent to the mediastinum, as seen on CT of the abdomen and pelvis. 2. Large cavitary lesion again noted at the periphery of the left upper lung lobe, relatively stable in size, measuring 6.4 x 3.8 cm. The degree of wall thickening has decreased from the prior study, while the amount of fluid within the cavitary lesion has increased. This suggests strongly against malignancy, and is suspicious for  a lung abscess. 3. Previously noted small 1 cm cavitary nodule at the left lower lobe has resolved. Left basilar atelectasis noted. 4. Interval improvement in patchy airspace opacity within both lungs, likely reflecting improving infectious process. Mild residual hazy opacities at the right middle lobe and left lingula. 5. Apparent enteric tube noted ending at the proximal esophagus. Would correlate clinically. 6. Mild soft tissue inflammation and fluid tracks inferiorly along Gerota's fascia bilaterally. 7. Chronic partially healed bilateral rib fractures noted. Electronically Signed   By: Garald Balding M.D.   On: 12/22/2016 17:51   Ct Abdomen Pelvis W Contrast  Result Date: 12/22/2016 CLINICAL DATA:  Abdominal distension.  Evaluate ileus. EXAM: CT ABDOMEN AND PELVIS WITH CONTRAST TECHNIQUE: Multidetector CT imaging of the abdomen and pelvis was performed using the standard protocol following bolus administration of intravenous contrast. CONTRAST:  168mL ISOVUE-300 IOPAMIDOL (ISOVUE-300) INJECTION 61% COMPARISON:  Abdominal x-ray dated 12/20/2016. CT chest/abdomen dated 12/16/2016 FINDINGS: Lower chest: Small pneumothorax at the left lung base anteriorly. Incompletely imaged cavitary mass/consolidation within the inferior aspects of the left upper lobe and/or lingula, as previously described on earlier chest CT. Hepatobiliary: Status post cholecystectomy. Liver appears normal. No bile duct dilatation. Pancreas: Unremarkable. Spleen: Normal in size without focal abnormality. Adrenals/Urinary Tract: Adrenal glands appear normal. Kidneys appear normal without mass, stone or hydronephrosis. Bladder is decompressed by Foley catheter. Stomach/Bowel: There is moderate distention of the transverse colon and right colon containing gas and fluid/stool,  measuring 6.6 cm diameter and 7.5 cm diameter respectively. Rectosigmoid colon and descending colon are normal in caliber. Small bowel is normal in caliber. Enteric tube  well positioned within the decompressed stomach. Vascular/Lymphatic: Scattered atherosclerotic changes of the normal caliber abdominal aorta. No enlarged lymph nodes seen in the abdomen or pelvis. Reproductive: Unremarkable. Other: Small amount of free fluid in the lower pelvis. Trace free fluid/edema within the upper paracolic gutter regions bilaterally. No abscess collections seen. No free intraperitoneal air. Musculoskeletal: Degenerative changes throughout the slightly scoliotic thoracolumbar spine, mild in degree. No acute or suspicious osseous finding. Multiple old rib fractures bilaterally. IMPRESSION: 1. Moderate distension of the transverse colon and right colon, containing gas and fluid/stool, measuring 6.6 cm diameter and 7.5 cm diameter respectively, most compatible with the given history of ileus as there is no transition point to suggest mechanical large bowel obstruction. 2. Pneumothorax at the left lung base anteriorly, incompletely imaged, perhaps related to a interval surgical/diagnostic intervention for the previously described cavitary mass/consolidation within the left lung. If no recent surgical/diagnostic intervention to explain this incompletely imaged pneumothorax at the left lung base, recommend chest CT for complete characterization. 3. Small amount of free fluid in the abdomen and pelvis. No abscess collection seen. No free intraperitoneal air. 4. Aortic atherosclerosis. These results were called by telephone at the time of interpretation on 12/22/2016 at 4:14 pm to Dr. Bonnielee Haff , who verbally acknowledged these results. Electronically Signed   By: Franki Cabot M.D.   On: 12/22/2016 16:15   Dg Chest Port 1 View  Result Date: 12/23/2016 CLINICAL DATA:  Shortness of breath. EXAM: PORTABLE CHEST 1 VIEW COMPARISON:  Radiograph of December 22, 2016. FINDINGS: Stable cardiomediastinal silhouette. No pneumothorax is noted. Mild central pulmonary vascular congestion is again noted.  Stable mild loculated left pleural effusion is noted. Old right rib fractures are noted. IMPRESSION: Stable cardiomediastinal silhouette with mild central pulmonary vascular congestion. Stable mild loculated left pleural effusion. Electronically Signed   By: Marijo Conception, M.D.   On: 12/23/2016 14:09   Dg Chest Port 1 View  Result Date: 12/22/2016 CLINICAL DATA:  Acute hypoxic respiratory failure. Streptococcal pneumonia. EXAM: PORTABLE CHEST 1 VIEW COMPARISON:  12/20/2016, 12/21/2016, 12/22/2016 FINDINGS: Cavitary lesion in the left lung appears to contain more fluid today, as observed on accompanying CT. No other significant interval change. The small left pneumothorax might be marginally visible in the apex. No significant pneumothorax. Right lung is clear. Hilar and mediastinal contours are unremarkable and unchanged. IMPRESSION: Air-fluid level within the left cavitary lesion. No significant pneumothorax. Trace pneumothorax may be visible in the left apex, also seen on CT. Electronically Signed   By: Andreas Newport M.D.   On: 12/22/2016 22:02   Dg Abd Portable 1v  Result Date: 12/23/2016 CLINICAL DATA:  Ileus, pneumonia EXAM: PORTABLE ABDOMEN - 1 VIEW COMPARISON:  12/23/2016 FINDINGS: Mild gaseous distention of bowel, slightly improved since prior study. Oral contrast material noted within the appendix and right colon. No free air organomegaly. Prior cholecystectomy. IMPRESSION: Some improvement in diffuse gaseous distention of bowel. Electronically Signed   By: Rolm Baptise M.D.   On: 12/23/2016 11:21   Dg Abd Portable 1v  Result Date: 12/23/2016 CLINICAL DATA:  Nasogastric tube placement.  Initial encounter. EXAM: PORTABLE ABDOMEN - 1 VIEW COMPARISON:  CT of the abdomen pelvis performed 12/22/2016 FINDINGS: The patient's enteric tube is noted ending overlying the body of the stomach. Air and contrast filled dilated loops of colon are  again noted, likely reflecting ileus. There is no definite  evidence for bowel obstruction. No free intra-abdominal air is seen, though evaluation for free air is noted on a single supine view. No acute osseous abnormalities are seen. The patient's left-sided cavitary lung lesion is partially imaged. IMPRESSION: 1. Enteric tube noted ending overlying the body of the stomach. 2. Air and contrast filled dilated loops of colon again noted, likely reflecting ileus. No free intra-abdominal air seen. Electronically Signed   By: Garald Balding M.D.   On: 12/23/2016 00:43   Dg Swallowing Func-speech Pathology  Result Date: 12/22/2016 Objective Swallowing Evaluation: Type of Study: MBS-Modified Barium Swallow Study Patient Details Name: Cheyann BRONDA MIZER MRN: [id number] Date of Birth: April 27, 1966 Today's Date: 12/22/2016 Time: SLP Start Time (ACUTE ONLY): 0951-SLP Stop Time (ACUTE ONLY): 1012 SLP Time Calculation (min) (ACUTE ONLY): 21 min Past Medical History: Past Medical History: Diagnosis Date . Allergic rhinitis  . Arthritis  . Asthma  . Back pain  . Bipolar 1 disorder ([place])  . Chronic low back pain 03/06/2014 . COPD (chronic obstructive pulmonary disease) ([place])  . Depression  . Fibroid  . Hypertension  . IBS (irritable bowel syndrome)  . Migraine  . Scoliosis  Past Surgical History: Past Surgical History: Procedure Laterality Date . CHOLECYSTECTOMY   . DILATION AND CURETTAGE OF UTERUS   . EXPLORATORY LAPAROTOMY    WHEN PATIENT WAS YOUNG . RIGHT FOOT SURGERY   . TUBAL LIGATION Bilateral  HPI: 51 y/o F, 45 pk year smoker, with Hx of COPD/asthma and bipolar disorder admitted 1/19 with SOB. Working diagnosis of acute asthma exacerbation, multilobar PNA with resultant hypoxic respiratory failure. Intubated 12/12/16 to 12/20/16.  Subjective: alert for evaluation, requires cues to maintain alertness Assessment / Plan / Recommendation CHL IP CLINICAL IMPRESSIONS 12/22/2016 Therapy Diagnosis Mild pharyngeal phase dysphagia;Moderate pharyngeal phase dysphagia  Clinical Impression Ms. Zwilling  was cooperative but drowsy during the MBS and required moderate cueing to attend to the assessment and POs. Pt exhibits mild-moderate pharyngeal dysphagia characterized by delayed swallow initiation that results in silent penetration (able to clear with 2nd reflexive swallow) during nectar thick consistencies. Thicker textures were better contained in the valleculae before the swallow. Backflow from upper esophageal segment to pharynx also noted with nectar thick liquids. Due to prolonged intubation, fluctuating mentation/alertness, and hx of CVA, recommend diet of honey thick liquids, pureed solids, meds whole in puree, with full supervision during mealtimes, small bites/sips, seated upright. ST will f/u with pt for treatment to assess safety/efficiency of swallowing function and possible diet upgrade.  Impact on safety and function Moderate aspiration risk   CHL IP TREATMENT RECOMMENDATION 12/22/2016 Treatment Recommendations Therapy as outlined in treatment plan below   Prognosis 12/22/2016 Prognosis for Safe Diet Advancement Good Barriers to Reach Goals -- Barriers/Prognosis Comment -- CH[facility] IP DIET RECOMMENDATION 12/22/2016 SLP Diet Recommendations Dysphagia 1 (Puree) solids;Honey thick liquids Liquid Administration via Cup;No straw Medication Administration Whole meds with puree Compensations Slow rate;Small sips/bites Postural Changes Seated upright at 90 degrees;Remain semi-upright after after feeds/meals (Comment)   CHL IP OTHER RECOMMENDATIONS 12/22/2016 Recommended Consults -- Oral Care Recommendations Oral care BID Other Recommendations Order thickener from pharmacy;Prohibited food (jello, ice cream, thin soups);Remove water pitcher   CHL IP FOLLOW UP RECOMMENDATIONS 12/22/2016 Follow up Recommendations Skilled Nursing facility   [facility] IP FREQUENCY AND DURATION 12/22/2016 Speech Therapy Frequency (ACUTE ONLY) min 2x/week Treatment Duration 2 weeks      CHL IP ORAL PHASE 12/22/2016 Oral Phase WFL Oral -  Pudding  Teaspoon -- Oral - Pudding Cup -- Oral - Honey Teaspoon -- Oral - Honey Cup -- Oral - Nectar Teaspoon -- Oral - Nectar Cup -- Oral - Nectar Straw -- Oral - Thin Teaspoon -- Oral - Thin Cup -- Oral - Thin Straw -- Oral - Puree -- Oral - Mech Soft -- Oral - Regular -- Oral - Multi-Consistency -- Oral - Pill -- Oral Phase - Comment --  CHL IP PHARYNGEAL PHASE 12/22/2016 Pharyngeal Phase Impaired Pharyngeal- Pudding Teaspoon -- Pharyngeal -- Pharyngeal- Pudding Cup -- Pharyngeal -- Pharyngeal- Honey Teaspoon -- Pharyngeal -- Pharyngeal- Honey Cup Delayed swallow initiation-vallecula;Other (Comment) Pharyngeal -- Pharyngeal- Nectar Teaspoon Delayed swallow initiation-pyriform sinuses;Penetration/Aspiration before swallow;Other (Comment) Pharyngeal Material enters airway, remains ABOVE vocal cords then ejected out Pharyngeal- Nectar Cup Delayed swallow initiation-pyriform sinuses;Other (Comment);Penetration/Aspiration before swallow Pharyngeal Material enters airway, remains ABOVE vocal cords then ejected out Pharyngeal- Nectar Straw -- Pharyngeal -- Pharyngeal- Thin Teaspoon -- Pharyngeal -- Pharyngeal- Thin Cup -- Pharyngeal -- Pharyngeal- Thin Straw -- Pharyngeal -- Pharyngeal- Puree Delayed swallow initiation-vallecula Pharyngeal -- Pharyngeal- Mechanical Soft -- Pharyngeal -- Pharyngeal- Regular -- Pharyngeal -- Pharyngeal- Multi-consistency -- Pharyngeal -- Pharyngeal- Pill -- Pharyngeal -- Pharyngeal Comment --  CHL IP CERVICAL ESOPHAGEAL PHASE 12/22/2016 Cervical Esophageal Phase Impaired Pudding Teaspoon -- Pudding Cup -- Honey Teaspoon -- Honey Cup -- Nectar Teaspoon -- Nectar Cup Esophageal backflow into the pharynx Nectar Straw -- Thin Teaspoon -- Thin Cup -- Thin Straw -- Puree -- Mechanical Soft -- Regular -- Multi-consistency -- Pill -- Cervical Esophageal Comment -- No flowsheet data found. Germain Osgood 12/22/2016, 11:25 AM  Note populated for Fransisca Kaufmann, student SLP Germain Osgood, M.A. CCC-SLP  [phone]              ASSESSMENT / PLAN:  Acute hypoxic respiratory failure secondary to multilobar PNA -Today on 4L [place], CXR reveals mild pulmonary vascular congestion  P Maintain Saturation >88 KVO fluids  Lasix 40 meq once Wean Fi02 as tolerated  Continue Duonebs Q6H Pulmicort BID Albuterol Q4H prn  F/U CXR   Streptococcus pneumoniae LUL abscess  P Trend Fever and WBC curve  Continue Unasyn (need for minimum of 3 weeks) Repeat CT scan in 2-3 weeks   Small Left anterior PTX - Resolved on CXR on 1/30  P Monitor   Acute Encephalopathy - Received 2 mg ativan overnight for agitation  H/O Bipolar  P Restart Home Medications when able to take PO  Avoid all sedative medications   Remaining of plan per Primary Team.   Hayden Pedro, AG-ACNP [facility]  Pgr: [phone]  PCCM Pgr: [phone]   ATTENDING NOTE / ATTESTATION NOTE :   I have discussed the case with the resident/APP  Hayden Pedro.    I agree with the resident/APP's  history, physical examination, assessment, and plans.    I have edited the above note and modified it according to our agreed history, physical examination, assessment and plan.   Patient is a 51 yo F recently discharged from the ICU after intubation (1/19 - 1/27) for acute hypoxic respiratory failure due to multilobar PNA. Her past medical history is significant for asthma, COPD, and bipolar disorder.  PCCM was called to evaluate patient again on 1/29  for increasing oxygen requirement from room air to 5L [place], worsening tachypnea, and tachycardia. CT abdomen performed earlier today revealed a colonic ileus without evidence of bowel obstruction. There was also note of a partially imaged pneumothorax at the left  lung base that was characterized as "tiny" on follow up chest CT. Her known left upper lobe lung abscess appeared unchanged with interval improvement in patchy airspace opacities bilaterally.    Overnight, was more agitated so she got ative.  Was sleepy this am. More conversant this pm.    Vitals:  Vitals:   12/24/16 1200 12/24/16 1300 12/24/16 1400 12/24/16 1445  BP:   (!) 158/96 (!) 158/96  Pulse: (!) 124 (!) 126 (!) 113 (!) 116  Resp: (!) 28 (!) 32 (!) 26 (!) 27  Temp:      TempSrc:      SpO2:    97%  Weight:      Height:        Constitutional/General: well-nourished, well-developed, in mild distress. Protecting airway.   Body mass index is 27.97 kg/m. Wt Readings from Last 3 Encounters:  12/21/16 73.9 kg (162 lb 14.7 oz)  12/01/16 75.2 kg (165 lb 12.8 oz)  08/14/16 77.1 kg (170 lb)    HEENT: PERLA, anicteric sclerae. (-) Oral thrush.   Neck: No masses. Midline trachea. No JVD, (-) LAD. (-) bruits appreciated.  Respiratory/Chest: Grossly normal chest. (-) deformity. (-) Accessory muscle use.  Symmetric expansion. Diminished BS on both lower lung zones. (-) wheezing, rhonchi Crackles at bases.  (-) egophony  Cardiovascular: Regular rate and  rhythm, heart sounds normal, no murmur or gallops,  Trace peripheral edema  Gastrointestinal:  Dec  bowel sounds. Soft, non-tender. No hepatosplenomegaly.  (-) masses.   Musculoskeletal:  Normal muscle tone.   Extremities: Grossly normal. (-) clubbing, cyanosis.  Trace  edema  Skin: (-) rash,lesions seen.   Neurological/Psychiatric :  CN grossly intact. (-) lateralizing signs.     CBC Recent Labs     12/22/16  2324  12/23/16  0222  12/24/16  0429  WBC  24.3*  25.2*  24.1*  HGB  11.0*  10.4*  10.2*  HCT  35.3*  33.7*  32.8*  PLT  532*  551*  664*    Coag's No results for input(s): APTT, INR in the last 72 hours.  BMET Recent Labs     12/22/16  2324  12/23/16  0222  12/24/16  0429  NA  143  145  139  K  3.6  3.5  3.2*  CL  109  109  109  CO2  22  25  22   BUN  12  12  6   CREATININE  0.55  0.57  0.44  GLUCOSE  109*  108*  122*    Electrolytes Recent Labs     12/22/16  0501   12/22/16  2324  12/23/16  0222  12/24/16  0429  CALCIUM  9.1  8.9  8.9  8.7*  MG  2.3   --   1.8   --   PHOS  3.3   --    --    --     Sepsis Markers No results for input(s): PROCALCITON, O2SATVEN in the last 72 hours.  Invalid input(s): LACTICACIDVEN  ABG Recent Labs     12/22/16  2315  12/24/16  1206  PHART  7.447  7.472*  PCO2ART  37.3  31.8*  PO2ART  71.7*  81.3*    Liver Enzymes Recent Labs     12/22/16  2324  12/23/16  0222  AST  23  26  ALT  67*  63*  ALKPHOS  75  71  BILITOT  0.7  0.7  ALBUMIN  3.0*  2.7*    Cardiac Enzymes No results for input(s): TROPONINI, PROBNP in the last 72 hours.  Glucose Recent Labs     12/23/16  1509  12/23/16  1955  12/23/16  2318  12/24/16  0312  12/24/16  0829  12/24/16  1240  GLUCAP  109*  113*  114*  117*  127*  101*    Imaging Ct Chest Wo Contrast  Result Date: 12/22/2016 CLINICAL DATA:  Evaluate pneumothorax seen on CT of the abdomen and pelvis. Initial encounter. EXAM: CT CHEST WITHOUT CONTRAST TECHNIQUE: Multidetector CT imaging of the chest was performed following the standard protocol without IV contrast. COMPARISON:  CT of the abdomen and pelvis performed earlier today at 3:40 p.m., and CT of the chest performed 12/16/2016 FINDINGS: Cardiovascular: The heart is normal in size. Minimal calcification is seen along the aortic arch. The great vessels are grossly unremarkable in appearance. Mediastinum/Nodes: The mediastinum is unremarkable in appearance. No mediastinal lymphadenopathy is seen. No pericardial effusion is identified. The visualized portions of the thyroid gland are unremarkable. No axillary lymphadenopathy is seen. An apparent enteric tube is noted ending at the proximal esophagus. Lungs/Pleura: There has been interval improvement in patchy airspace opacity within both lungs, likely reflecting improving infectious process. Mild residual hazy airspace opacities are seen particularly at the right middle lobe  and left lingula. A large cavitary lesion is again noted at the periphery of the left upper lobe, relatively stable in size, measuring approximately 6.4 x 3.8 cm. The degree of wall thickening has decreased from the prior study, while the amount of fluid within the cavitary lesion has increased. This suggests strongly against malignancy, and is suspicious for a lung abscess. The previously noted small 1 cm minimally cavitary nodule at the left lower lobe has resolved. Left basilar atelectasis is noted. No definite pleural effusion is seen. A tiny left anterior pneumothorax is noted, localized to the anterior left lung base adjacent to the mediastinum, as seen on CT of the abdomen and pelvis, but new from the prior CT of the chest. Upper Abdomen: The visualized portions of the liver and spleen are grossly unremarkable. The patient is status post cholecystectomy, with clips noted along the gallbladder fossa. Mild soft tissue inflammation and fluid tracks inferiorly along Gerota's fascia bilaterally. The visualized portions of the pancreas, adrenal glands and kidneys are within normal limits. Musculoskeletal: No acute osseous abnormalities are identified. Chronic partially healed bilateral rib fractures are noted. The visualized musculature is unremarkable in appearance. IMPRESSION: 1. Tiny left anterior pneumothorax, localized to the anterior left lung base adjacent to the mediastinum, as seen on CT of the abdomen and pelvis. 2. Large cavitary lesion again noted at the periphery of the left upper lung lobe, relatively stable in size, measuring 6.4 x 3.8 cm. The degree of wall thickening has decreased from the prior study, while the amount of fluid within the cavitary lesion has increased. This suggests strongly against malignancy, and is suspicious for a lung abscess. 3. Previously noted small 1 cm cavitary nodule at the left lower lobe has resolved. Left basilar atelectasis noted. 4. Interval improvement in patchy  airspace opacity within both lungs, likely reflecting improving infectious process. Mild residual hazy opacities at the right middle lobe and left lingula. 5. Apparent enteric tube noted ending at the proximal esophagus. Would correlate clinically. 6. Mild soft tissue inflammation and fluid tracks inferiorly along Gerota's fascia bilaterally. 7. Chronic partially healed bilateral rib fractures noted. Electronically Signed   By: Jacqulynn Cadet  Chang M.D.   On: 12/22/2016 17:51   Ct Abdomen Pelvis W Contrast  Result Date: 12/22/2016 CLINICAL DATA:  Abdominal distension.  Evaluate ileus. EXAM: CT ABDOMEN AND PELVIS WITH CONTRAST TECHNIQUE: Multidetector CT imaging of the abdomen and pelvis was performed using the standard protocol following bolus administration of intravenous contrast. CONTRAST:  176mL ISOVUE-300 IOPAMIDOL (ISOVUE-300) INJECTION 61% COMPARISON:  Abdominal x-ray dated 12/20/2016. CT chest/abdomen dated 12/16/2016 FINDINGS: Lower chest: Small pneumothorax at the left lung base anteriorly. Incompletely imaged cavitary mass/consolidation within the inferior aspects of the left upper lobe and/or lingula, as previously described on earlier chest CT. Hepatobiliary: Status post cholecystectomy. Liver appears normal. No bile duct dilatation. Pancreas: Unremarkable. Spleen: Normal in size without focal abnormality. Adrenals/Urinary Tract: Adrenal glands appear normal. Kidneys appear normal without mass, stone or hydronephrosis. Bladder is decompressed by Foley catheter. Stomach/Bowel: There is moderate distention of the transverse colon and right colon containing gas and fluid/stool, measuring 6.6 cm diameter and 7.5 cm diameter respectively. Rectosigmoid colon and descending colon are normal in caliber. Small bowel is normal in caliber. Enteric tube well positioned within the decompressed stomach. Vascular/Lymphatic: Scattered atherosclerotic changes of the normal caliber abdominal aorta. No enlarged lymph nodes  seen in the abdomen or pelvis. Reproductive: Unremarkable. Other: Small amount of free fluid in the lower pelvis. Trace free fluid/edema within the upper paracolic gutter regions bilaterally. No abscess collections seen. No free intraperitoneal air. Musculoskeletal: Degenerative changes throughout the slightly scoliotic thoracolumbar spine, mild in degree. No acute or suspicious osseous finding. Multiple old rib fractures bilaterally. IMPRESSION: 1. Moderate distension of the transverse colon and right colon, containing gas and fluid/stool, measuring 6.6 cm diameter and 7.5 cm diameter respectively, most compatible with the given history of ileus as there is no transition point to suggest mechanical large bowel obstruction. 2. Pneumothorax at the left lung base anteriorly, incompletely imaged, perhaps related to a interval surgical/diagnostic intervention for the previously described cavitary mass/consolidation within the left lung. If no recent surgical/diagnostic intervention to explain this incompletely imaged pneumothorax at the left lung base, recommend chest CT for complete characterization. 3. Small amount of free fluid in the abdomen and pelvis. No abscess collection seen. No free intraperitoneal air. 4. Aortic atherosclerosis. These results were called by telephone at the time of interpretation on 12/22/2016 at 4:14 pm to Dr. Bonnielee Haff , who verbally acknowledged these results. Electronically Signed   By: Franki Cabot M.D.   On: 12/22/2016 16:15   Dg Chest Port 1 View  Result Date: 12/24/2016 CLINICAL DATA:  Shortness of Breath, change in behavior EXAM: PORTABLE CHEST 1 VIEW COMPARISON:  12/23/2016 FINDINGS: Cardiomediastinal silhouette is unremarkable. Stable cavitary lesion in left midlung peripheral measures 4.2 cm. Small partially loculated left pleural effusion left basilar atelectasis or infiltrate. There is small left apical pneumothorax about 4-5%. No pulmonary edema. IMPRESSION: Stable  cavitary lesion left midlung peripheral. Small left pleural effusion left basilar atelectasis or infiltrate. Small left apical pneumothorax. Electronically Signed   By: Lahoma Crocker M.D.   On: 12/24/2016 12:35   Dg Chest Port 1 View  Result Date: 12/23/2016 CLINICAL DATA:  Shortness of breath. EXAM: PORTABLE CHEST 1 VIEW COMPARISON:  Radiograph of December 22, 2016. FINDINGS: Stable cardiomediastinal silhouette. No pneumothorax is noted. Mild central pulmonary vascular congestion is again noted. Stable mild loculated left pleural effusion is noted. Old right rib fractures are noted. IMPRESSION: Stable cardiomediastinal silhouette with mild central pulmonary vascular congestion. Stable mild loculated left pleural effusion. Electronically Signed  By: Marijo Conception, M.D.   On: 12/23/2016 14:09   Dg Chest Port 1 View  Result Date: 12/22/2016 CLINICAL DATA:  Acute hypoxic respiratory failure. Streptococcal pneumonia. EXAM: PORTABLE CHEST 1 VIEW COMPARISON:  12/20/2016, 12/21/2016, 12/22/2016 FINDINGS: Cavitary lesion in the left lung appears to contain more fluid today, as observed on accompanying CT. No other significant interval change. The small left pneumothorax might be marginally visible in the apex. No significant pneumothorax. Right lung is clear. Hilar and mediastinal contours are unremarkable and unchanged. IMPRESSION: Air-fluid level within the left cavitary lesion. No significant pneumothorax. Trace pneumothorax may be visible in the left apex, also seen on CT. Electronically Signed   By: Andreas Newport M.D.   On: 12/22/2016 22:02   Dg Abd Portable 1v  Result Date: 12/24/2016 CLINICAL DATA:  Abdominal pain, labored breathing EXAM: PORTABLE ABDOMEN - 1 VIEW COMPARISON:  12/23/2016 FINDINGS: Decreasing gaseous distention of large and small bowel. Gas within nondistended large small bowel currently. Oral contrast material noted within the right colon and scattered small bowel loops. IMPRESSION:  Decreasing gaseous distention of large and small bowel. Electronically Signed   By: Rolm Baptise M.D.   On: 12/24/2016 11:20   Dg Abd Portable 1v  Result Date: 12/23/2016 CLINICAL DATA:  Ileus, pneumonia EXAM: PORTABLE ABDOMEN - 1 VIEW COMPARISON:  12/23/2016 FINDINGS: Mild gaseous distention of bowel, slightly improved since prior study. Oral contrast material noted within the appendix and right colon. No free air organomegaly. Prior cholecystectomy. IMPRESSION: Some improvement in diffuse gaseous distention of bowel. Electronically Signed   By: Rolm Baptise M.D.   On: 12/23/2016 11:21   Dg Abd Portable 1v  Result Date: 12/23/2016 CLINICAL DATA:  Nasogastric tube placement.  Initial encounter. EXAM: PORTABLE ABDOMEN - 1 VIEW COMPARISON:  CT of the abdomen pelvis performed 12/22/2016 FINDINGS: The patient's enteric tube is noted ending overlying the body of the stomach. Air and contrast filled dilated loops of colon are again noted, likely reflecting ileus. There is no definite evidence for bowel obstruction. No free intra-abdominal air is seen, though evaluation for free air is noted on a single supine view. No acute osseous abnormalities are seen. The patient's left-sided cavitary lung lesion is partially imaged. IMPRESSION: 1. Enteric tube noted ending overlying the body of the stomach. 2. Air and contrast filled dilated loops of colon again noted, likely reflecting ileus. No free intra-abdominal air seen. Electronically Signed   By: Garald Balding M.D.   On: 12/23/2016 00:43   Assessment and Plan : 1. Acute hypoxemic resp failure, multifactorial. Likely 2/2 abd distention/ileus, narcotics. Cont to keep o2 sats > 88%. Currently on 3L and sats are 97%. Wean  down O2 to keep sats > 88%. Does not appear to be in asthma/copd exacerbation. May need diuresis. CXR 1/31 w/o significant change.   2. Tiny left anterior PTX.  CXR w/o evidence of PTX.  Will observe for now. CXR prn.   3. Ileus. Pt refuses  NGT. On clears. Surgery following.   4. Strep pna LUL lung abscess. Cont unasyn.   5. Cdiff colitis. Cont with vanc enema and flagyl.    Family :  No family at bedside.    Monica Becton, MD 12/24/2016, 3:13 PM [facility] Pager [phone] After 3 pm or if no answer, call [phone]

## 2016-12-24 NOTE — Progress Notes (Signed)
Patients daughter wants patient back on bipolar and anxiety meds. I explained that we are limited on giving her some things because she is currently NPO and and has refused the NG tube.It will be up to the MD to restart her meds when appropriate. Daughter wanted something for patient to have for anxiety for tonight I notified on call PA. And one time dose of IV ativan given with a little relief. Daughter remains at bedside with many questions that  I answered.Patient tries to refuse most care offered to her. I will continue to monitor.

## 2016-12-24 NOTE — Care Management Note (Addendum)
Case Management Note  Patient Details  Name: Tanya KANYIAH STEFANEK MRN: [id number] Date of Birth: Sep 03, 1966  Subjective/Objective:  Patient with CVA, Asthma exacerbation, multilobar pna with hypoxic resp failure, extubated 1/28, transferred to SDU with ileus NG tube was place, patient has pulled NG out and refusing to have it replaced.  Per pt eval rec SNF,  CSW aware.  NCM will cont to follow for dc needs.                   Action/Plan:   Expected Discharge Date:                  Expected Discharge Plan:  Skilled Nursing Facility  In-House Referral:  Clinical Social Work  Discharge planning Services  CM Consult  Post Acute Care Choice:    Choice offered to:     DME Arranged:    DME Agency:     HH Arranged:    [place] Agency:     Status of Service:  In process, will continue to follow  If discussed at Long Length of Stay Meetings, dates discussed:    Additional Comments:  Zenon Mayo, RN 12/24/2016, 12:37 PM

## 2016-12-25 ENCOUNTER — Inpatient Hospital Stay (HOSPITAL_COMMUNITY): Payer: Medicaid Other

## 2016-12-25 DIAGNOSIS — J441 Chronic obstructive pulmonary disease with (acute) exacerbation: Secondary | ICD-10-CM

## 2016-12-25 DIAGNOSIS — J851 Abscess of lung with pneumonia: Secondary | ICD-10-CM | POA: Diagnosis present

## 2016-12-25 LAB — COMPREHENSIVE METABOLIC PANEL
ALBUMIN: 2.5 g/dL — AB (ref 3.5–5.0)
ALK PHOS: 71 U/L (ref 38–126)
ALT: 38 U/L (ref 14–54)
AST: 21 U/L (ref 15–41)
Anion gap: 10 (ref 5–15)
BUN: 7 mg/dL (ref 6–20)
CALCIUM: 8.5 mg/dL — AB (ref 8.9–10.3)
CO2: 21 mmol/L — ABNORMAL LOW (ref 22–32)
Chloride: 110 mmol/L (ref 101–111)
Creatinine, Ser: 0.41 mg/dL — ABNORMAL LOW (ref 0.44–1.00)
GFR calc Af Amer: >60 mL/min (ref >60)
GLUCOSE: 104 mg/dL — AB (ref 65–99)
Potassium: 3.6 mmol/L (ref 3.5–5.1)
Sodium: 141 mmol/L (ref 135–145)
Total Bilirubin: 0.7 mg/dL (ref 0.3–1.2)
Total Protein: 5.6 g/dL — ABNORMAL LOW (ref 6.5–8.1)

## 2016-12-25 LAB — BLOOD GAS, ARTERIAL
Acid-base deficit: 0.8 mmol/L (ref 0.0–2.0)
BICARBONATE: 22.2 mmol/L (ref 20.0–28.0)
DRAWN BY: 405301
O2 Content: 5 L/min
O2 Saturation: 93.3 %
PCO2 ART: 29.8 mmHg — AB (ref 32.0–48.0)
PH ART: 7.486 — AB (ref 7.350–7.450)
PO2 ART: 68.7 mmHg — AB (ref 83.0–108.0)
Patient temperature: 98.6

## 2016-12-25 LAB — CBC
HCT: 31 % — ABNORMAL LOW (ref 36.0–46.0)
Hemoglobin: 9.8 g/dL — ABNORMAL LOW (ref 12.0–15.0)
MCH: 29.1 pg (ref 26.0–34.0)
MCHC: 31.6 g/dL (ref 30.0–36.0)
MCV: 92 fL (ref 78.0–100.0)
PLATELETS: 602 10*3/uL — AB (ref 150–400)
RBC: 3.37 MIL/uL — ABNORMAL LOW (ref 3.87–5.11)
RDW: 16.8 % — AB (ref 11.5–15.5)
WBC: 21.6 10*3/uL — ABNORMAL HIGH (ref 4.0–10.5)

## 2016-12-25 LAB — GLUCOSE, CAPILLARY
GLUCOSE-CAPILLARY: 120 mg/dL — AB (ref 65–99)
Glucose-Capillary: 106 mg/dL — ABNORMAL HIGH (ref 65–99)
Glucose-Capillary: 125 mg/dL — ABNORMAL HIGH (ref 65–99)
Glucose-Capillary: 127 mg/dL — ABNORMAL HIGH (ref 65–99)
Glucose-Capillary: 141 mg/dL — ABNORMAL HIGH (ref 65–99)

## 2016-12-25 LAB — PHOSPHORUS: Phosphorus: 2.4 mg/dL — ABNORMAL LOW (ref 2.5–4.6)

## 2016-12-25 LAB — PROCALCITONIN: PROCALCITONIN: 0.2 ng/mL

## 2016-12-25 LAB — MAGNESIUM: MAGNESIUM: 1.6 mg/dL — AB (ref 1.7–2.4)

## 2016-12-25 MED ORDER — GABAPENTIN 400 MG PO CAPS: 800.0000 mg | ORAL_CAPSULE | Freq: Three times a day (TID) | ORAL | Status: DC

## 2016-12-25 MED ORDER — QUETIAPINE FUMARATE 100 MG PO TABS
100.0000 mg | ORAL_TABLET | Freq: Every day | ORAL | Status: DC
  Administered 2016-12-25: 100 mg via ORAL
  Filled 2016-12-25: qty 1

## 2016-12-25 MED ORDER — ESCITALOPRAM OXALATE 10 MG PO TABS
40.0000 mg | ORAL_TABLET | Freq: Every day | ORAL | Status: DC
  Administered 2016-12-25 – 2016-12-26 (×2): 40 mg via ORAL
  Filled 2016-12-25 (×2): qty 2

## 2016-12-25 MED ORDER — METHYLPREDNISOLONE SODIUM SUCC 40 MG IJ SOLR
40.0000 mg | Freq: Two times a day (BID) | INTRAMUSCULAR | Status: DC
  Administered 2016-12-25 – 2016-12-26 (×2): 40 mg via INTRAVENOUS
  Filled 2016-12-25 (×2): qty 1

## 2016-12-25 MED ORDER — METHYLPREDNISOLONE SODIUM SUCC 125 MG IJ SOLR
125.0000 mg | Freq: Once | INTRAMUSCULAR | Status: AC
  Administered 2016-12-25: 125 mg via INTRAVENOUS
  Filled 2016-12-25: qty 2

## 2016-12-25 MED ORDER — GABAPENTIN 300 MG PO CAPS
300.0000 mg | ORAL_CAPSULE | Freq: Three times a day (TID) | ORAL | Status: DC
  Administered 2016-12-25 – 2016-12-26 (×2): 300 mg via ORAL
  Filled 2016-12-25 (×2): qty 1

## 2016-12-25 MED ORDER — LAMOTRIGINE 100 MG PO TABS
100.0000 mg | ORAL_TABLET | Freq: Two times a day (BID) | ORAL | Status: DC
  Administered 2016-12-25 – 2016-12-26 (×2): 100 mg via ORAL
  Filled 2016-12-25 (×3): qty 1

## 2016-12-25 MED ORDER — LEVALBUTEROL HCL 0.63 MG/3ML IN NEBU
0.6300 mg | INHALATION_SOLUTION | Freq: Once | RESPIRATORY_TRACT | Status: AC
  Administered 2016-12-25: 0.63 mg via RESPIRATORY_TRACT
  Filled 2016-12-25: qty 3

## 2016-12-25 MED ORDER — RESOURCE THICKENUP CLEAR PO POWD
ORAL | Status: DC | PRN
  Filled 2016-12-25: qty 125

## 2016-12-25 MED ORDER — PRO-STAT SUGAR FREE PO LIQD
30.0000 mL | Freq: Two times a day (BID) | ORAL | Status: DC
  Administered 2016-12-25: 30 mL via ORAL
  Filled 2016-12-25: qty 30

## 2016-12-25 NOTE — Progress Notes (Signed)
Patient ID: [name], female   DOB: [birth year], 51 y.o.   MRN: [id number]                                                                PROGRESS NOTE                                                                                                                                                                                                             Patient Demographics:    [name], is a 51 y.o. female, DOB - April 26, 1966, [id number]  Admit date - 12/12/2016   Admitting Physician Marshell Garfinkel, MD  Outpatient Primary MD for the patient is [name], MD  LOS - 13  Outpatient Specialists:   Chief Complaint  Patient presents with  . Shortness of Breath      Brief Narrative:  51 year old female with a history of COPD/asthma and bipolar disorder who was admitted to the hospital with shortness of breath. She was found to have pneumonia. She had to be intubated for worsening respiratory failure. She was admitted to the intensive care unit. Extubated and transferred to the floor. Noted to have loose stools w/ abdominal distention. During this hospitalization she was also found to have an acute stroke. Dysphagia has been an issue. Patient subsequently developed diarrhea and ileus. Concern for C. difficile. On the night of 1/29 she became short of breath thought to be due to abdominal distention. NG tube was placed. She stabilized.  Subjective: The patient is resting quietly in bed.  somewhat somnolent this am.   Pt does open her eyes, to command.   She is somewhat slow to respond to my questions but does so appropriately. She denies chest pain fevers chills nausea or vomiting abd pain.  Pt doesn't mention diarrhea.    Assessment & Plan:  C. Difficile colitis / ileus CT scan of the abdomen and pelvis showed colonic ileus - C. difficile toxigenic PCR + > being treated for the same - General Surgery following the ileus - tolerating sips of clears at this time   Acute  hypoxic respiratory failure secondary to Multilobar PNAwith small pneumothorax CT scan incidentally showed a pneumothorax. CT chest confirmed a small pneumothorax. Possibly related to the left-sided lung cavity. PCCM following and addressing this issue.  Multilobar Strep pneumoniae pneumonia with cavitary lesion Cultures from tracheal aspirate were positive for Strep pneumo. Per PCCM she needs Unasyn for a minimum of 3 weeks.  HIV nonreactive. Abx tx and O2 support continue.    Repeat CT chest => showed worsening infiltrates, defer to PCCM  In terms of treatment Appreciate PCCM input  Demand ischemia with history of grade 2 diastolic dysfunction Troponin was elevated up to 1.25. Subsequently started trending down. Patient is on a beta blocker. TTE w/ no wall motion abnormalities. May benefit from outpatient cardiology evaluation.       Filed Weights   12/19/16 0500 12/20/16 0500 12/21/16 0352  Weight: 76.3 kg (168 lb 3.2 oz) 72.2 kg (159 lb 2.8 oz) 73.9 kg (162 lb 14.7 oz)    AKI Resolved w/ IV fluid - creatinine was elevated up to 3.46  Last Labs    Recent Labs Lab 12/21/16 1947 12/22/16 0501 12/22/16 2324 12/23/16 0222 12/24/16 0429  CREATININE 0.54 0.60 0.55 0.57 0.44      Acute asthma exacerbation Appears to be resolving. Pt on solumedrol 29m iv bid, continues pulmicort and duoneb and albuterol prn  Hypokalemia Continue to replace with goal of 4.0  Ischemic hepatitis At admission AST was 2024 and ALT was 1366. Most likely due to shock liver. Hepatitis panel negative. LFTs have improved.  Check cmp in am  Dysphagia Speech therapy is following. She underwent modified barium swallow on 1/29 and was cleared for dysphagia 1 diet.  Continue sips of clear liquids and follow ileus  Acute Anemia Hgb 10.4 on admission. Baseline around 12.  Stable at this time. 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 Tx w/ course of acyclovir.  Acute  encephalopathy Remains confused. Distracted. Continue to monitor for now.   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 Patient was seen by Neurology. Stroke thought to be secondary to acute illness. Aspirin recommended. Secondary risk reduction.   Bipolar d/o Continue seroquel, lexapro, and lamotrigine  Protein calorie malnutrition (severe) prostat  DVT prophylaxis: SQ heparin Code Status: FULL CODE Family Communication: no family present at time of exam  Disposition Plan: SDU   Consultants:  PCCM Gen Surgery   Procedures: none  Antimicrobials:  Ceftriaxone 1/19 >> 1/24 Azithromycin 1/19 >> 1/22 Acyclovir 1/22 > 1/28 Unasyn 1/24 > Flagyl 1/29 > Vanc 1/29 >   Lab Results  Component Value Date   PLT 602 (H) 12/25/2016      Anti-infectives    Start     Dose/Rate Route Frequency Ordered Stop   12/23/16 0130  vancomycin (VANCOCIN) 500 mg in sodium chloride irrigation 0.9 % 100 mL ENEMA     500 mg Rectal Every 6 hours 12/23/16 0049     12/22/16 2354  metroNIDAZOLE (FLAGYL) IVPB 500 mg     500 mg 100 mL/hr over 60 Minutes Intravenous Every 8 hours 12/22/16 2354     12/22/16 2200  vancomycin (VANCOCIN) 50 mg/mL oral solution 125 mg  Status:  Discontinued     125 mg Oral 4 times daily 12/22/16 2058 12/23/16 0049   12/22/16 1000  vancomycin (VANCOCIN) 50 mg/mL oral solution 125 mg  Status:  Discontinued     125 mg Per Tube 4 times daily 12/22/16 0947 12/22/16 2058   12/17/16 1200  Ampicillin-Sulbactam (UNASYN) 3 g in sodium chloride 0.9 % 100 mL IVPB     3 g 200 mL/hr over 30 Minutes Intravenous Every 6 hours 12/17/16 1144  12/15/16 1015  acyclovir (ZOVIRAX) 200 MG/5ML suspension SUSP 400 mg     400 mg Per Tube 3 times daily 12/15/16 1000 12/22/16 0959   12/13/16 2000  azithromycin (ZITHROMAX) 500 mg in dextrose 5 % 250 mL IVPB  Status:  Discontinued     500 mg 250 mL/hr over 60 Minutes Intravenous Every 24 hours 12/12/16 2241 12/15/16 0957   12/13/16  2000  cefTRIAXone (ROCEPHIN) 1 g in dextrose 5 % 50 mL IVPB  Status:  Discontinued     1 g 100 mL/hr over 30 Minutes Intravenous Every 24 hours 12/12/16 2241 12/17/16 1144   12/12/16 2015  cefTRIAXone (ROCEPHIN) 1 g in dextrose 5 % 50 mL IVPB     1 g 100 mL/hr over 30 Minutes Intravenous  Once 12/12/16 2013 12/12/16 2110   12/12/16 2015  azithromycin (ZITHROMAX) 500 mg in dextrose 5 % 250 mL IVPB     500 mg 250 mL/hr over 60 Minutes Intravenous  Once 12/12/16 2013 12/12/16 2225        Objective:   Vitals:   12/25/16 0524 12/25/16 0852 12/25/16 0943 12/25/16 1255  BP:  (!) 163/88 (!) 149/89 (!) 163/96  Pulse: (!) 124 (!) 123 (!) 129 (!) 111  Resp: (!) 28 (!) 25 (!) 21 (!) 22  Temp:    98.4 F (36.9 C)  TempSrc:    Axillary  SpO2: 97% 96% 95% 97%  Weight:      Height:        Wt Readings from Last 3 Encounters:  12/25/16 76.7 kg (169 lb 1.5 oz)  12/01/16 75.2 kg (165 lb 12.8 oz)  08/14/16 77.1 kg (170 lb)     Intake/Output Summary (Last 24 hours) at 12/25/16 1418 Last data filed at 12/25/16 0945  Gross per 24 hour  Intake             1205 ml  Output              590 ml  Net              615 ml     Physical Exam  Awake Alert, Oriented X 3, No new F.N deficits, Normal affect [place].AT,PERRAL Supple Neck,No JVD, No cervical lymphadenopathy appriciated.  Symmetrical Chest wall movement, Good air movement bilaterally,  Crackles left lung base,  RRR,No Gallops,Rubs or new Murmurs, No Parasternal Heave +ve B.Sounds, Abd Soft, No tenderness, No organomegaly appriciated, No rebound - guarding or rigidity. No Cyanosis, Clubbing or edema, No new Rash or bruise   Rectal tube in place    Data Review:    CBC  Recent Labs Lab 12/21/16 0227 12/22/16 0501 12/22/16 2324 12/23/16 0222 12/24/16 0429 12/25/16 0332  WBC 22.1* 18.0* 24.3* 25.2* 24.1* 21.6*  HGB 10.9* 11.1* 11.0* 10.4* 10.2* 9.8*  HCT 35.5* 35.3* 35.3* 33.7* 32.8* 31.0*  PLT 483* 453* 532* 551* 664* 602*  MCV  93.7 93.9 92.7 93.4 90.6 92.0  MCH 28.8 29.5 28.9 28.8 28.2 29.1  MCHC 30.7 31.4 31.2 30.9 31.1 31.6  RDW 16.3* 16.7* 16.3* 16.5* 16.0* 16.8*  LYMPHSABS 1.7  --   --   --   --   --   MONOABS 1.8*  --   --   --   --   --   EOSABS 0.0  --   --   --   --   --   BASOSABS 0.0  --   --   --   --   --  Chemistries   Recent Labs Lab 12/19/16 0207  12/20/16 8502  12/21/16 7741  12/22/16 0501 12/22/16 2324 12/23/16 0222 12/24/16 0429 12/25/16 0332  NA 150*  < > 150*  < >  --   < > 147* 143 145 139 141  K 3.7  < > 3.0*  < >  --   < > 5.1 3.6 3.5 3.2* 3.6  CL 108  < > 109  < >  --   < > 113* 109 109 109 110  CO2 31  < > 31  < >  --   < > 23 22 25 22  21*  GLUCOSE 177*  < > 166*  < >  --   < > 100* 109* 108* 122* 104*  BUN 21*  < > 23*  < >  --   < > 15 12 12 6 7   CREATININE 0.53  < > 0.67  < >  --   < > 0.60 0.55 0.57 0.44 0.41*  CALCIUM 8.8*  < > 8.9  < >  --   < > 9.1 8.9 8.9 8.7* 8.5*  MG 2.1  --   --   --  2.1  --  2.3  --  1.8  --  1.6*  AST  --   --  30  --   --   --   --  23 26  --  21  ALT  --   --  123*  --   --   --   --  67* 63*  --  38  ALKPHOS  --   --  81  --   --   --   --  75 71  --  71  BILITOT  --   --  0.4  --   --   --   --  0.7 0.7  --  0.7  < > = values in this interval not displayed. ------------------------------------------------------------------------------------------------------------------ No results for input(s): CHOL, HDL, LDLCALC, TRIG, CHOLHDL, LDLDIRECT in the last 72 hours.  Lab Results  Component Value Date   HGBA1C 5.4 12/13/2016   ------------------------------------------------------------------------------------------------------------------ No results for input(s): TSH, T4TOTAL, T3FREE, THYROIDAB in the last 72 hours.  Invalid input(s): FREET3 ------------------------------------------------------------------------------------------------------------------ No results for input(s): VITAMINB12, FOLATE, FERRITIN, TIBC, IRON, RETICCTPCT in  the last 72 hours.  Coagulation profile No results for input(s): INR, PROTIME in the last 168 hours.  No results for input(s): DDIMER in the last 72 hours.  Cardiac Enzymes No results for input(s): CKMB, TROPONINI, MYOGLOBIN in the last 168 hours.  Invalid input(s): CK ------------------------------------------------------------------------------------------------------------------    Component Value Date/Time   BNP 571.9 (H) 12/12/2015 1640    Inpatient Medications  Scheduled Meds: . ampicillin-sulbactam (UNASYN) IV  3 g Intravenous Q6H  . aspirin  325 mg Per Tube Daily  . budesonide (PULMICORT) nebulizer solution  0.5 mg Nebulization BID  . chlorhexidine gluconate (MEDLINE KIT)  15 mL Mouth Rinse BID  . feeding supplement (PRO-STAT SUGAR FREE 64)  30 mL Oral BID  . heparin subcutaneous  5,000 Units Subcutaneous Q8H  . insulin aspart  0-9 Units Subcutaneous Q4H  . ipratropium-albuterol  3 mL Nebulization Q6H  . mouth rinse  15 mL Mouth Rinse QID  . methylPREDNISolone (SOLU-MEDROL) injection  40 mg Intravenous Q12H  . metoprolol  5 mg Intravenous Q6H  . metronidazole  500 mg Intravenous Q8H  . potassium chloride  40 mEq Oral Once  . vancomycin (VANCOCIN) rectal ENEMA  500  mg Rectal Q6H   Continuous Infusions: . dextrose 5 % and 0.9 % NaCl with KCl 40 mEq/L 60 mL/hr at 12/25/16 1100   PRN Meds:.albuterol, hydrALAZINE, lidocaine, RESOURCE THICKENUP CLEAR, white petrolatum  Micro Results Recent Results (from the past 240 hour(s))  Hsv Culture And Typing     Status: Abnormal   Collection Time: 12/15/16  3:23 PM  Result Value Ref Range Status   HSV Culture/Type Comment (A)  Final    Comment: (NOTE) Positive for Herpes simplex virus type-1. Typing was confirmed by monoclonal antibody microscopic immunofluorescence. Performed At: [facility] [place], [place] [id number] Lindon Romp MD ME:[id number]    Source of Sample ORAL  Final  Culture,  blood (routine x 2)     Status: None   Collection Time: 12/17/16  4:58 PM  Result Value Ref Range Status   Specimen Description BLOOD RIGHT ARM  Final   Special Requests IN PEDIATRIC BOTTLE [place]  Final   Culture NO GROWTH 5 DAYS  Final   Report Status 12/22/2016 FINAL  Final  Culture, blood (routine x 2)     Status: None   Collection Time: 12/17/16  5:05 PM  Result Value Ref Range Status   Specimen Description BLOOD RIGHT HAND  Final   Special Requests IN PEDIATRIC BOTTLE 3CC  Final   Culture NO GROWTH 5 DAYS  Final   Report Status 12/22/2016 FINAL  Final  C difficile quick scan w PCR reflex     Status: Abnormal   Collection Time: 12/21/16  6:25 PM  Result Value Ref Range Status   C Diff antigen POSITIVE (A) NEGATIVE Final   C Diff toxin NEGATIVE NEGATIVE Final   C Diff interpretation Results are indeterminate. See PCR results.  Final  Clostridium Difficile by PCR     Status: Abnormal   Collection Time: 12/21/16  6:25 PM  Result Value Ref Range Status   Toxigenic C Difficile by pcr POSITIVE (A) NEGATIVE Final    Comment: Positive for toxigenic C. difficile with little to no toxin production. Only treat if clinical presentation suggests symptomatic illness.    Radiology Reports Ct Head Wo Contrast  Result Date: 12/13/2016 CLINICAL DATA:  51 y/o  F; hypoxia and altered mental status. EXAM: CT HEAD WITHOUT CONTRAST TECHNIQUE: Contiguous axial images were obtained from the base of the skull through the vertex without intravenous contrast. COMPARISON:  11/12/2007 CT head. FINDINGS: Brain: Wedge-shaped focus of hypoattenuation with loss of gray-white differentiation involving the left occipital lobe may represent an acute or subacute infarction (series 2, image 10 and series 4, image 57). Chronic left inferior frontal infarct. Mild chronic microvascular ischemic changes and parenchymal volume loss of the brain. No acute hemorrhage, hydrocephalus, or extra-axial collection is identified.  Vascular: No hyperdense vessel. Skull: Normal. Negative for fracture or focal lesion. Sinuses/Orbits: No acute finding. Other: None. IMPRESSION: Left occipital lobe probable acute or early subacute infarction. No acute intracranial hemorrhage. Critical Value/emergent results were called by telephone at the time of interpretation on 12/13/2016 at 12:07 am to [facility], who verbally acknowledged these results. Electronically Signed   By: Kristine Garbe M.D.   On: 12/13/2016 00:04   Ct Chest Wo Contrast  Result Date: 12/25/2016 CLINICAL DATA:  LEFT lower lobe abscess.  Bacteria pneumonia. EXAM: CT CHEST WITHOUT CONTRAST TECHNIQUE: Multidetector CT imaging of the chest was performed following the standard protocol without IV contrast. COMPARISON:  Chest CT 12/22/2016 FINDINGS: Cardiovascular: No significant vascular findings. Normal  heart size. No pericardial effusion. Mediastinum/Nodes: No axillary supraclavicular adenopathy no mediastinal hilar adenopathy. No pericardial effusion esophagus normal. Lungs/Pleura: Moderate-sized LEFT pneumothorax with gas collecting anteriorly in this supine patient (approximately 20% hemothorax volume). This fluid level at the inferior LEFT hemithorax consistent with a component of hydropneumothorax (image 110, series 4). Cavitary lesion / pulmonary abscess in the lingula is similar in size measuring 6.2 by 3.8 cm compared to 6.3 x 3.8 cm (image 67, series 4) There is new loculated fluid at the RIGHT lung base measuring 6.3 cm in depth with a small air-fluid level. Loculated fluid along the LEFT cardiac border. There is new atelectasis of the LEFT lung base. RIGHT lung is relatively clear.  There is centrilobular emphysema Upper Abdomen: Adrenal glands normal. Musculoskeletal: Multiple bilateral healed rib fractures laterally on the LEFT and RIGHT. IMPRESSION: 1. Moderate volume LEFT hydropneumothorax slightly increased in volume from radiograph 12/24/2016. 2. No  change in size of pulmonary abscess in the lingula. 3. Interval increase in loculated fluid at the LEFT lung base with scattered air pockets and lower lobe atelectasis. These results will be called to the ordering clinician or representative by the Radiologist Assistant, and communication documented in the PACS or zVision Dashboard. Electronically Signed   By: Suzy Bouchard M.D.   On: 12/25/2016 09:06   Ct Chest Wo Contrast  Result Date: 12/22/2016 CLINICAL DATA:  Evaluate pneumothorax seen on CT of the abdomen and pelvis. Initial encounter. EXAM: CT CHEST WITHOUT CONTRAST TECHNIQUE: Multidetector CT imaging of the chest was performed following the standard protocol without IV contrast. COMPARISON:  CT of the abdomen and pelvis performed earlier today at 3:40 p.m., and CT of the chest performed 12/16/2016 FINDINGS: Cardiovascular: The heart is normal in size. Minimal calcification is seen along the aortic arch. The great vessels are grossly unremarkable in appearance. Mediastinum/Nodes: The mediastinum is unremarkable in appearance. No mediastinal lymphadenopathy is seen. No pericardial effusion is identified. The visualized portions of the thyroid gland are unremarkable. No axillary lymphadenopathy is seen. An apparent enteric tube is noted ending at the proximal esophagus. Lungs/Pleura: There has been interval improvement in patchy airspace opacity within both lungs, likely reflecting improving infectious process. Mild residual hazy airspace opacities are seen particularly at the right middle lobe and left lingula. A large cavitary lesion is again noted at the periphery of the left upper lobe, relatively stable in size, measuring approximately 6.4 x 3.8 cm. The degree of wall thickening has decreased from the prior study, while the amount of fluid within the cavitary lesion has increased. This suggests strongly against malignancy, and is suspicious for a lung abscess. The previously noted small 1 cm  minimally cavitary nodule at the left lower lobe has resolved. Left basilar atelectasis is noted. No definite pleural effusion is seen. A tiny left anterior pneumothorax is noted, localized to the anterior left lung base adjacent to the mediastinum, as seen on CT of the abdomen and pelvis, but new from the prior CT of the chest. Upper Abdomen: The visualized portions of the liver and spleen are grossly unremarkable. The patient is status post cholecystectomy, with clips noted along the gallbladder fossa. Mild soft tissue inflammation and fluid tracks inferiorly along Gerota's fascia bilaterally. The visualized portions of the pancreas, adrenal glands and kidneys are within normal limits. Musculoskeletal: No acute osseous abnormalities are identified. Chronic partially healed bilateral rib fractures are noted. The visualized musculature is unremarkable in appearance. IMPRESSION: 1. Tiny left anterior pneumothorax, localized to the anterior left  lung base adjacent to the mediastinum, as seen on CT of the abdomen and pelvis. 2. Large cavitary lesion again noted at the periphery of the left upper lung lobe, relatively stable in size, measuring 6.4 x 3.8 cm. The degree of wall thickening has decreased from the prior study, while the amount of fluid within the cavitary lesion has increased. This suggests strongly against malignancy, and is suspicious for a lung abscess. 3. Previously noted small 1 cm cavitary nodule at the left lower lobe has resolved. Left basilar atelectasis noted. 4. Interval improvement in patchy airspace opacity within both lungs, likely reflecting improving infectious process. Mild residual hazy opacities at the right middle lobe and left lingula. 5. Apparent enteric tube noted ending at the proximal esophagus. Would correlate clinically. 6. Mild soft tissue inflammation and fluid tracks inferiorly along Gerota's fascia bilaterally. 7. Chronic partially healed bilateral rib fractures noted.  Electronically Signed   By: Garald Balding M.D.   On: 12/22/2016 17:51   Ct Chest Wo Contrast  Result Date: 12/16/2016 CLINICAL DATA:  Evaluate cavitary structure in left lung. EXAM: CT CHEST WITHOUT CONTRAST TECHNIQUE: Multidetector CT imaging of the chest was performed following the standard protocol without IV contrast. COMPARISON:  Chest radiograph 12/16/2016.  Chest CT 12/12/2015 FINDINGS: Cardiovascular: Atherosclerotic calcifications the aortic arch. Normal caliber of the thoracic aorta. Mediastinum/Nodes: Nasogastric tube in the esophagus. Mild enlargement of prevascular lymph nodes, largest measuring 0.9 cm in the short axis on sequence 3, image 45. Otherwise, there is no significant chest lymphadenopathy. Limited evaluation for hilar lymphadenopathy on this noncontrast examination. Lungs/Pleura: Endotracheal tube in the trachea. There are underlying emphysematous changes with patchy parenchymal densities in the upper lungs. There are patchy parenchymal densities in the right middle lobe. Small amount of pleural fluid in the posterior left lung base with focal atelectasis or consolidation the posterior left lower lobe. There is a peripheral cavitary lesion in the left upper lobe/ lingular region. This cavitary lesion is new from the previous CT and measures 5.9 x 4.1 x 4.3 cm. A small amount of fluid within this cavitary lesion. There is concern for a small nodular lesion in left lower lobe with minimal cavitation. This is seen on sequence 7, image 96 and measures roughly 1.0 cm. Upper Abdomen: Nasogastric tube extends in the stomach. Gallbladder has been surgically removed. Normal appearance of the adrenal glands. Musculoskeletal: Old bilateral rib fractures. IMPRESSION: New cavitary lesion in the left upper lobe, measuring up to 5.9 cm. There is a small amount of fluid in this structure. Findings are suggestive for a lung abscess but a neoplastic process cannot be completely excluded. There is also a  1 cm nodular structure in the left lower lobe with minimal cavitation. This 1 cm nodule could also represent an infectious or inflammatory process. Emphysema with scattered areas of parenchymal disease and interstitial thickening throughout the upper lobes and in the right middle lobe. Findings could be related to edema and/or infection. Small amount of pleural fluid with volume loss or consolidation along the posterior left lower lobe. Slightly enlarged prevascular lymph nodes. These could be reactive in etiology. Electronically Signed   By: Markus Daft M.D.   On: 12/16/2016 19:38   Mr Brain Wo Contrast  Result Date: 12/14/2016 CLINICAL DATA:  Acute hypoxic respiratory failure. Acute encephalopathy. EXAM: MRI HEAD WITHOUT CONTRAST TECHNIQUE: Multiplanar, multiecho pulse sequences of the brain and surrounding structures were obtained without intravenous contrast. COMPARISON:  Head CT 12/12/2016. FINDINGS: Brain: Diffusion imaging shows a  cluster of small acute infarctions in the left cerebellum. No brainstem abnormality. Cerebral hemispheres show mild chronic small-vessel ischemic change of the deep white matter. There is an old left inferior frontal infarction which has progressed to encephalomalacia. This could be ischemic or post traumatic. No mass lesion, hemorrhage, hydrocephalus or extra-axial collection. Vascular: Major vessels at the base of the brain show flow. 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 No mass effect or hemorrhage. 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 Mild chronic small-vessel ischemic change of the hemispheric white matter. Left occipital lobe questioned by CT is negative by MRI. Electronically Signed   By: Nelson Chimes M.D.   On: 12/14/2016 12:18   Ct Abdomen Pelvis W Contrast  Result Date: 12/22/2016 CLINICAL DATA:  Abdominal  distension.  Evaluate ileus. EXAM: CT ABDOMEN AND PELVIS WITH CONTRAST TECHNIQUE: Multidetector CT imaging of the abdomen and pelvis was performed using the standard protocol following bolus administration of intravenous contrast. CONTRAST:  142m ISOVUE-300 IOPAMIDOL (ISOVUE-300) INJECTION 61% COMPARISON:  Abdominal x-ray dated 12/20/2016. CT chest/abdomen dated 12/16/2016 FINDINGS: Lower chest: Small pneumothorax at the left lung base anteriorly. Incompletely imaged cavitary mass/consolidation within the inferior aspects of the left upper lobe and/or lingula, as previously described on earlier chest CT. Hepatobiliary: Status post cholecystectomy. Liver appears normal. No bile duct dilatation. Pancreas: Unremarkable. Spleen: Normal in size without focal abnormality. Adrenals/Urinary Tract: Adrenal glands appear normal. Kidneys appear normal without mass, stone or hydronephrosis. Bladder is decompressed by Foley catheter. Stomach/Bowel: There is moderate distention of the transverse colon and right colon containing gas and fluid/stool, measuring 6.6 cm diameter and 7.5 cm diameter respectively. Rectosigmoid colon and descending colon are normal in caliber. Small bowel is normal in caliber. Enteric tube well positioned within the decompressed stomach. Vascular/Lymphatic: Scattered atherosclerotic changes of the normal caliber abdominal aorta. No enlarged lymph nodes seen in the abdomen or pelvis. Reproductive: Unremarkable. Other: Small amount of free fluid in the lower pelvis. Trace free fluid/edema within the upper paracolic gutter regions bilaterally. No abscess collections seen. No free intraperitoneal air. Musculoskeletal: Degenerative changes throughout the slightly scoliotic thoracolumbar spine, mild in degree. No acute or suspicious osseous finding. Multiple old rib fractures bilaterally. IMPRESSION: 1. Moderate distension of the transverse colon and right colon, containing gas and fluid/stool, measuring 6.6  cm diameter and 7.5 cm diameter respectively, most compatible with the given history of ileus as there is no transition point to suggest mechanical large bowel obstruction. 2. Pneumothorax at the left lung base anteriorly, incompletely imaged, perhaps related to a interval surgical/diagnostic intervention for the previously described cavitary mass/consolidation within the left lung. If no recent surgical/diagnostic intervention to explain this incompletely imaged pneumothorax at the left lung base, recommend chest CT for complete characterization. 3. Small amount of free fluid in the abdomen and pelvis. No abscess collection seen. No free intraperitoneal air. 4. Aortic atherosclerosis. These results were called by telephone at the time of interpretation on 12/22/2016 at 4:14 pm to Dr. GBonnielee Haff, who verbally acknowledged these results. Electronically Signed   By: SFranki CabotM.D.   On: 12/22/2016 16:15   Dg Chest Port 1 View  Result Date: 12/25/2016 CLINICAL DATA:  Hypoxia and shortness of breath. EXAM: PORTABLE CHEST 1 VIEW COMPARISON:  Radiograph yesterday.  CT 12/22/2016 FINDINGS: Cavitary lesion in the left lung is again seen. Unchanged left lung base opacity may be combination of pleural  fluid and airspace disease. Patient is rotated. Left pneumothorax on prior exam is not confidently identified. Minimal right lung base atelectasis. IMPRESSION: 1. Unchanged left basilar opacity likely combination of pleural fluid and airspace disease, atelectasis or pneumonia. Cavitary lesion in the periphery of the left lung is again seen. 2. Left pneumothorax on prior exam is not confidently identified. 3. Right basilar atelectasis. Electronically Signed   By: Jeb Levering M.D.   On: 12/25/2016 04:50   Dg Chest Port 1 View  Result Date: 12/24/2016 CLINICAL DATA:  Shortness of Breath, change in behavior EXAM: PORTABLE CHEST 1 VIEW COMPARISON:  12/23/2016 FINDINGS: Cardiomediastinal silhouette is unremarkable.  Stable cavitary lesion in left midlung peripheral measures 4.2 cm. Small partially loculated left pleural effusion left basilar atelectasis or infiltrate. There is small left apical pneumothorax about 4-5%. No pulmonary edema. IMPRESSION: Stable cavitary lesion left midlung peripheral. Small left pleural effusion left basilar atelectasis or infiltrate. Small left apical pneumothorax. Electronically Signed   By: Lahoma Crocker M.D.   On: 12/24/2016 12:35   Dg Chest Port 1 View  Result Date: 12/23/2016 CLINICAL DATA:  Shortness of breath. EXAM: PORTABLE CHEST 1 VIEW COMPARISON:  Radiograph of December 22, 2016. FINDINGS: Stable cardiomediastinal silhouette. No pneumothorax is noted. Mild central pulmonary vascular congestion is again noted. Stable mild loculated left pleural effusion is noted. Old right rib fractures are noted. IMPRESSION: Stable cardiomediastinal silhouette with mild central pulmonary vascular congestion. Stable mild loculated left pleural effusion. Electronically Signed   By: Marijo Conception, M.D.   On: 12/23/2016 14:09   Dg Chest Port 1 View  Result Date: 12/22/2016 CLINICAL DATA:  Acute hypoxic respiratory failure. Streptococcal pneumonia. EXAM: PORTABLE CHEST 1 VIEW COMPARISON:  12/20/2016, 12/21/2016, 12/22/2016 FINDINGS: Cavitary lesion in the left lung appears to contain more fluid today, as observed on accompanying CT. No other significant interval change. The small left pneumothorax might be marginally visible in the apex. No significant pneumothorax. Right lung is clear. Hilar and mediastinal contours are unremarkable and unchanged. IMPRESSION: Air-fluid level within the left cavitary lesion. No significant pneumothorax. Trace pneumothorax may be visible in the left apex, also seen on CT. Electronically Signed   By: Andreas Newport M.D.   On: 12/22/2016 22:02   Dg Chest Port 1 View  Result Date: 12/21/2016 CLINICAL DATA:  Acute respiratory failure with hypoxia EXAM: PORTABLE CHEST  1 VIEW COMPARISON:  12/20/2016 FINDINGS: Cavitary lesion in the left mid lung again noted, stable. Right lung is clear. Heart is normal size. NG tube is in the stomach. Interval extubation. IMPRESSION: Cavitary area in the left mid lung, unchanged. Interval extubation. Electronically Signed   By: Rolm Baptise M.D.   On: 12/21/2016 07:28   Dg Chest Port 1 View  Result Date: 12/20/2016 CLINICAL DATA:  Ventilator EXAM: PORTABLE CHEST 1 VIEW COMPARISON:  12/19/2016 FINDINGS: Endotracheal tube and NG tube are unchanged. Cavitary lesion in the left mid lung again noted, stable. Heart is normal size. Right lung is clear. No visible effusions or acute bony abnormality. IMPRESSION: Stable cavitary lesion in the left mid lung. Endotracheal tube stable. Electronically Signed   By: Rolm Baptise M.D.   On: 12/20/2016 07:14   Dg Chest Port 1 View  Result Date: 12/19/2016 CLINICAL DATA:  Check endotracheal tube placement EXAM: PORTABLE CHEST 1 VIEW COMPARISON:  12/18/2016 FINDINGS: Endotracheal tube and nasogastric catheter are again seen and stable. Cardiac shadow is within normal limits. The lungs are well aerated bilaterally. Previously seen effusion and  left basilar infiltrate has resolved in the interval. Persistent left cavitary lesion is noted. No new focal abnormality is seen. IMPRESSION: Improved aeration in the left base. Cavitary lesion on the left is stable. Electronically Signed   By: Inez Catalina M.D.   On: 12/19/2016 08:46   Dg Chest Port 1 View  Result Date: 12/18/2016 CLINICAL DATA:  Intubated patient, respiratory failure, asthma -COPD, CHF, current smoker. EXAM: PORTABLE CHEST 1 VIEW COMPARISON:  Portable chest x-ray of December 17, 2016 FINDINGS: The lungs are reasonably well inflated. There is persistent small left pleural effusion and cavitary lesion in the left mid lung. The right lung exhibits mild interstitial prominence inferiorly. The heart is normal in size. The pulmonary vascularity is not  engorged. The endotracheal tube tip projects 4 cm above the carina. The esophagogastric tube tip projects below the inferior margin of the image. IMPRESSION: Persistent left mid lung cavitary lesion. A small left pleural effusion is slightly more conspicuous today. The support tubes are in reasonable position. Electronically Signed   By: David  Martinique M.D.   On: 12/18/2016 07:08   Dg Chest Port 1 View  Result Date: 12/17/2016 CLINICAL DATA:  Check endotracheal tube, shortness of Breath EXAM: PORTABLE CHEST 1 VIEW COMPARISON:  12/16/2016 FINDINGS: Cardiac shadow is stable. Endotracheal tube and nasogastric catheter are noted in satisfactory position. Stable cavitary lesion is noted in the left mid lung similar to that seen on prior exams. No new focal infiltrate is noted. Small pleural effusion in the left base is seen. IMPRESSION: Stable left cavitary lesion in left pleural effusion. Tubes and lines as described. Electronically Signed   By: Inez Catalina M.D.   On: 12/17/2016 08:02   Dg Chest Port 1 View  Result Date: 12/16/2016 CLINICAL DATA:  Asthma, COPD, intubation, hypertension, smoker EXAM: PORTABLE CHEST 1 VIEW COMPARISON:  Portable exam 0810 hours compared 12/14/2016 FINDINGS: Tip of endotracheal tube projects 3.3 cm above carina. Nasogastric tube extends into stomach. Upper normal heart size. Mediastinal contours and pulmonary vascularity normal. New large cavitary lesion within the lateral mid to lower LEFT chest at site of prior dense consolidation likely represents a lung abscess, 4.9 x 4.2 cm. Persistent infiltrate throughout the mid to lower LEFT lung again seen. Mild infiltrate lateral RIGHT base again noted. No definite pleural effusion or pneumothorax. Healing fractures of lower lateral RIGHT ribs again noted. IMPRESSION: Persistent infiltrates in mid to lower LEFT lung and at lateral RIGHT lung base with new area of cavitation at the lateral mid to lower LEFT lung consistent with a lung  abscess. Electronically Signed   By: Lavonia Dana M.D.   On: 12/16/2016 08:33   Dg Chest Port 1 View  Result Date: 12/14/2016 CLINICAL DATA:  Evaluate ET tube placement EXAM: PORTABLE CHEST 1 VIEW COMPARISON:  December 13, 2016 FINDINGS: The ETT is in good position. The NG tube remains. No pneumothorax. Persistent bilateral pulmonary opacities, left greater than right for slightly more prominent in the right base and mildly decreased on the left in the interval. Recommend follow-up to complete resolution. IMPRESSION: 1. Stable support apparatus. 2. Bilateral pulmonary opacities persist, mildly improved on the left and mildly worsened in the right base. Electronically Signed   By: Dorise Bullion III M.D   On: 12/14/2016 08:01   Dg Chest Port 1 View  Result Date: 12/13/2016 CLINICAL DATA:  Evaluate for lung collapse. EXAM: PORTABLE CHEST 1 VIEW COMPARISON:  December 12, 2016 FINDINGS: There has been interval improvement on the  left. While there is still opacity throughout the left mid and lower lung, the more focal infiltrate in the left base is much improved. Minimal opacity in the right base. No other interval change. ET and NG tubes are in good position. IMPRESSION: 1. Persistent bilateral pulmonary opacities, greater on the left than the right. The more focal component of opacity in the left base is improved however. Recommend follow-up to complete resolution. Electronically Signed   By: Dorise Bullion III M.D   On: 12/13/2016 12:24   Dg Chest Portable 1 View  Result Date: 12/12/2016 CLINICAL DATA:  Post intubation EXAM: PORTABLE CHEST 1 VIEW COMPARISON:  Chest x-ray from earlier same day. FINDINGS: Endotracheal tube well positioned with tip approximately 2 cm above the carina. Nasogastric tube passes below the diaphragm. Dense opacity at the left lung base is unchanged in extent although denser on the present exam. The milder patchy airspace opacities at the right lung base are unchanged.  Cardiomediastinal silhouette appears stable. IMPRESSION: 1. Endotracheal tube well positioned with tip just above the level of the carina. 2. Enteric tube passes below the diaphragm. 3. Large dense opacity at the left lung base, stable in extent but increased in density compared to the chest x-ray from earlier today suggesting airspace collapse, increasingly confluent edema and/or increasing pleural effusion. Suspect additional mild edema at the right lung base, stable. Electronically Signed   By: Franki Cabot M.D.   On: 12/12/2016 21:42   Dg Chest Portable 1 View  Result Date: 12/12/2016 CLINICAL DATA:  Dyspnea EXAM: PORTABLE CHEST 1 VIEW COMPARISON:  CXR 08/14/2016, chest CT 12/12/2015 FINDINGS: Patchy airspace opacities are seen along the periphery of the left lung and right lung base. Multilobar pneumonia might account for this appearance. Asymmetric pulmonary edema would be less common. Heart is borderline enlarged. Slightly more prominent aorta likely due to patient rotation. No acute osseous abnormality. Small left effusion is not entirely excluded. IMPRESSION: Pulmonary consolidations noted bilaterally which may reflect multilobar pneumonia involving both lung bases, left upper lobe and lingula. Cannot exclude a small left effusion given hazy opacity at the left lung base. Electronically Signed   By: Ashley Royalty M.D.   On: 12/12/2016 20:05   Dg Abd Portable 1v  Result Date: 12/24/2016 CLINICAL DATA:  Abdominal pain, labored breathing EXAM: PORTABLE ABDOMEN - 1 VIEW COMPARISON:  12/23/2016 FINDINGS: Decreasing gaseous distention of large and small bowel. Gas within nondistended large small bowel currently. Oral contrast material noted within the right colon and scattered small bowel loops. IMPRESSION: Decreasing gaseous distention of large and small bowel. Electronically Signed   By: Rolm Baptise M.D.   On: 12/24/2016 11:20   Dg Abd Portable 1v  Result Date: 12/23/2016 CLINICAL DATA:  Ileus,  pneumonia EXAM: PORTABLE ABDOMEN - 1 VIEW COMPARISON:  12/23/2016 FINDINGS: Mild gaseous distention of bowel, slightly improved since prior study. Oral contrast material noted within the appendix and right colon. No free air organomegaly. Prior cholecystectomy. IMPRESSION: Some improvement in diffuse gaseous distention of bowel. Electronically Signed   By: Rolm Baptise M.D.   On: 12/23/2016 11:21   Dg Abd Portable 1v  Result Date: 12/23/2016 CLINICAL DATA:  Nasogastric tube placement.  Initial encounter. EXAM: PORTABLE ABDOMEN - 1 VIEW COMPARISON:  CT of the abdomen pelvis performed 12/22/2016 FINDINGS: The patient's enteric tube is noted ending overlying the body of the stomach. Air and contrast filled dilated loops of colon are again noted, likely reflecting ileus. There is no definite evidence for  bowel obstruction. No free intra-abdominal air is seen, though evaluation for free air is noted on a single supine view. No acute osseous abnormalities are seen. The patient's left-sided cavitary lung lesion is partially imaged. IMPRESSION: 1. Enteric tube noted ending overlying the body of the stomach. 2. Air and contrast filled dilated loops of colon again noted, likely reflecting ileus. No free intra-abdominal air seen. Electronically Signed   By: Garald Balding M.D.   On: 12/23/2016 00:43   Dg Abd Portable 1v  Result Date: 12/20/2016 CLINICAL DATA:  Initial evaluation for NG tube placement. EXAM: PORTABLE ABDOMEN - 1 VIEW COMPARISON:  Prior radiograph from 12/13/2016. FINDINGS: NG tube in place with tip and side hole overlying the gastric body. Side hole well beyond the GE junction. Tip projects inferiorly. Multiple dilated gas-filled loops of bowel seen within the abdomen measuring up to 7.3 cm. Visualized lung bases grossly clear. IMPRESSION: 1. Tip and side hole of NG tube overlying the gastric body. Side hole beyond the GE junction. Tip projects inferiorly. 2. Multiple gas-filled loops of bowel seen  throughout the partially visualized abdomen. Findings may reflect sequelae of ileus or possibly distal obstructive process. Electronically Signed   By: Jeannine Boga M.D.   On: 12/20/2016 22:02   Dg Abd Portable 1v  Result Date: 12/13/2016 CLINICAL DATA:  Feeding tube placement EXAM: PORTABLE ABDOMEN - 1 VIEW COMPARISON:  12/12/2016 FINDINGS: Nasogastric tube tip is in the distal body of stomach. Side port is well below the GE junction. Cholecystectomy clips noted within the right upper quadrant of the abdomen. IMPRESSION: 1. Satisfactory position of nasogastric tube. Electronically Signed   By: Kerby Moors M.D.   On: 12/13/2016 11:05   Dg Swallowing Func-speech Pathology  Result Date: 12/22/2016 Objective Swallowing Evaluation: Type of Study: MBS-Modified Barium Swallow Study Patient Details Name: Shalayah CHANDANI ROGOWSKI MRN: [id number] Date of Birth: 1966/07/23 Today's Date: 12/22/2016 Time: SLP Start Time (ACUTE ONLY): 0951-SLP Stop Time (ACUTE ONLY): 1012 SLP Time Calculation (min) (ACUTE ONLY): 21 min Past Medical History: Past Medical History: Diagnosis Date . Allergic rhinitis  . Arthritis  . Asthma  . Back pain  . Bipolar 1 disorder ([place])  . Chronic low back pain 03/06/2014 . COPD (chronic obstructive pulmonary disease) ([place])  . Depression  . Fibroid  . Hypertension  . IBS (irritable bowel syndrome)  . Migraine  . Scoliosis  Past Surgical History: Past Surgical History: Procedure Laterality Date . CHOLECYSTECTOMY   . DILATION AND CURETTAGE OF UTERUS   . EXPLORATORY LAPAROTOMY    WHEN PATIENT WAS YOUNG . RIGHT FOOT SURGERY   . TUBAL LIGATION Bilateral  HPI: 51 y/o F, 45 pk year smoker, with Hx of COPD/asthma and bipolar disorder admitted 1/19 with SOB. Working diagnosis of acute asthma exacerbation, multilobar PNA with resultant hypoxic respiratory failure. Intubated 12/12/16 to 12/20/16.  Subjective: alert for evaluation, requires cues to maintain alertness Assessment / Plan / Recommendation CHL IP  CLINICAL IMPRESSIONS 12/22/2016 Therapy Diagnosis Mild pharyngeal phase dysphagia;Moderate pharyngeal phase dysphagia  Clinical Impression Ms. Halling was cooperative but drowsy during the MBS and required moderate cueing to attend to the assessment and POs. Pt exhibits mild-moderate pharyngeal dysphagia characterized by delayed swallow initiation that results in silent penetration (able to clear with 2nd reflexive swallow) during nectar thick consistencies. Thicker textures were better contained in the valleculae before the swallow. Backflow from upper esophageal segment to pharynx also noted with nectar thick liquids. Due to prolonged intubation, fluctuating mentation/alertness, and hx of CVA,  recommend diet of honey thick liquids, pureed solids, meds whole in puree, with full supervision during mealtimes, small bites/sips, seated upright. ST will f/u with pt for treatment to assess safety/efficiency of swallowing function and possible diet upgrade.  Impact on safety and function Moderate aspiration risk   CHL IP TREATMENT RECOMMENDATION 12/22/2016 Treatment Recommendations Therapy as outlined in treatment plan below   Prognosis 12/22/2016 Prognosis for Safe Diet Advancement Good Barriers to Reach Goals -- Barriers/Prognosis Comment -- CHL IP DIET RECOMMENDATION 12/22/2016 SLP Diet Recommendations Dysphagia 1 (Puree) solids;Honey thick liquids Liquid Administration via Cup;No straw Medication Administration Whole meds with puree Compensations Slow rate;Small sips/bites Postural Changes Seated upright at 90 degrees;Remain semi-upright after after feeds/meals (Comment)   CHL IP OTHER RECOMMENDATIONS 12/22/2016 Recommended Consults -- Oral Care Recommendations Oral care BID Other Recommendations Order thickener from pharmacy;Prohibited food (jello, ice cream, thin soups);Remove water pitcher   CHL IP FOLLOW UP RECOMMENDATIONS 12/22/2016 Follow up Recommendations Skilled Nursing facility   [facility] IP FREQUENCY AND DURATION  12/22/2016 Speech Therapy Frequency (ACUTE ONLY) min 2x/week Treatment Duration 2 weeks      CHL IP ORAL PHASE 12/22/2016 Oral Phase WFL Oral - Pudding Teaspoon -- Oral - Pudding Cup -- Oral - Honey Teaspoon -- Oral - Honey Cup -- Oral - Nectar Teaspoon -- Oral - Nectar Cup -- Oral - Nectar Straw -- Oral - Thin Teaspoon -- Oral - Thin Cup -- Oral - Thin Straw -- Oral - Puree -- Oral - Mech Soft -- Oral - Regular -- Oral - Multi-Consistency -- Oral - Pill -- Oral Phase - Comment --  CHL IP PHARYNGEAL PHASE 12/22/2016 Pharyngeal Phase Impaired Pharyngeal- Pudding Teaspoon -- Pharyngeal -- Pharyngeal- Pudding Cup -- Pharyngeal -- Pharyngeal- Honey Teaspoon -- Pharyngeal -- Pharyngeal- Honey Cup Delayed swallow initiation-vallecula;Other (Comment) Pharyngeal -- Pharyngeal- Nectar Teaspoon Delayed swallow initiation-pyriform sinuses;Penetration/Aspiration before swallow;Other (Comment) Pharyngeal Material enters airway, remains ABOVE vocal cords then ejected out Pharyngeal- Nectar Cup Delayed swallow initiation-pyriform sinuses;Other (Comment);Penetration/Aspiration before swallow Pharyngeal Material enters airway, remains ABOVE vocal cords then ejected out Pharyngeal- Nectar Straw -- Pharyngeal -- Pharyngeal- Thin Teaspoon -- Pharyngeal -- Pharyngeal- Thin Cup -- Pharyngeal -- Pharyngeal- Thin Straw -- Pharyngeal -- Pharyngeal- Puree Delayed swallow initiation-vallecula Pharyngeal -- Pharyngeal- Mechanical Soft -- Pharyngeal -- Pharyngeal- Regular -- Pharyngeal -- Pharyngeal- Multi-consistency -- Pharyngeal -- Pharyngeal- Pill -- Pharyngeal -- Pharyngeal Comment --  CHL IP CERVICAL ESOPHAGEAL PHASE 12/22/2016 Cervical Esophageal Phase Impaired Pudding Teaspoon -- Pudding Cup -- Honey Teaspoon -- Honey Cup -- Nectar Teaspoon -- Nectar Cup Esophageal backflow into the pharynx Nectar Straw -- Thin Teaspoon -- Thin Cup -- Thin Straw -- Puree -- Mechanical Soft -- Regular -- Multi-consistency -- Pill -- Cervical Esophageal  Comment -- No flowsheet data found. Germain Osgood 12/22/2016, 11:25 AM  Note populated for Fransisca Kaufmann, student SLP Germain Osgood, M.A. CCC-SLP [phone]             US Abdomen Limited Ruq  Result Date: 12/13/2016 CLINICAL DATA:  Transaminitis. EXAM: US ABDOMEN LIMITED - RIGHT UPPER QUADRANT COMPARISON:  09/18/2013 FINDINGS: Gallbladder: Surgically absent. Common bile duct: Diameter: 6.2 mm, normal. Liver: No focal lesion identified. Within normal limits in parenchymal echogenicity. Normal directional flow in the main portal vein. IMPRESSION: Postcholecystectomy without biliary dilatation. Unremarkable sonographic appearance of the liver. Electronically Signed   By: Jeb Levering M.D.   On: 12/13/2016 03:33    Time Spent in minutes  30   Jani Gravel M.D on 12/25/2016 at 2:18 PM  Between 7am to 7pm - Pager - [phone]  After 7pm go to [email or web address] - password [facility]  [facility] -  Office  [phone]

## 2016-12-25 NOTE — Progress Notes (Signed)
Name: [name] MRN: [id number] DOB: Dec 07, 1965    ADMISSION DATE:  12/12/2016 CONSULTATION DATE: 1/19  REFERRING MD : Alvino Chapel, MD   CHIEF COMPLAINT: Asthma Exacerbation   BRIEF PATIENT DESCRIPTION: 51 yo female with PMH asthma, COPD, and bipolar disorder.  Presented on 1/19 hypoxic with working diagnosis of acute asthma exacerbation, multilobar Streptococcus pneumoniae PNA with evidence of abscess formation within the left upper lobe with a small left-sided pneumothorax as well as C.Diff. Intubated 1/19-1/27.   1/28 PCCM was called to evaluate patient for increasing oxygen requirement from room air to 5L [place], worsening tachypnea, and tachycardia. CT abdomen revealed a colonic ileus without evidence of bowel obstruction.  SIGNIFICANT EVENTS  Admit / Intubate 1/19 Extubated 1/27  STUDIES:  CXR 1/19>> Consolidations bilaterally CT head 1/19>> Left occipital lobe probable acute or early subacute infarction. No acute intracranial hemorrhage. 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 There is a small amount of fluid in this structure. Also a 1 cm nodular structure in the left lower lobe with minimal cavitation.  Abd xr 1/27>> bowel distended with gas CT Chest 2/1 > Moderate left hydropneumothorax, no change in size of pulmonary abscess in the lingula, interval increase in loculated fluid at the left lung base with scattered air pockets and lower lobe atelectasis  Cultures: Blood 1/19 >> Negative Respiratory 1/20 >> Strep Pneumo  RVP 1/20 >> Negative  Oral HSV 1/22 >> positive (completed acyclovir)  Blood 1/24 >> Negative  C diff 1/28 >> Antigen & PCR positive   Antibiotics: Azithromycin 1/19 - 1/21 Ceftriaxone 1/19 - 1/23 Acyclovir 1/22 - 1/27 Unasyn 1/24 >> Vanco  1/29 >>  SUBJECTIVE:  Respiratory Distress overnight administered solu-medrol   VITAL SIGNS: Temp:  [98.6 F (37 C)-100.1 F (37.8 C)] 99.2 F (37.3 C) (02/01 0309) Pulse Rate:  [103-138] 129 (02/01 0943) Resp:  [21-35] 21 (02/01 0943) BP: (143-179)/(74-103) 149/89 (02/01 0943) SpO2:  [94 %-97 %] 95 % (02/01 0943) Weight:  [76.7 kg (169 lb 1.5 oz)] 76.7 kg (169 lb 1.5 oz) (02/01 0407)  PHYSICAL EXAMINATION: General: Adult female, lying in bed  Neuro: Lethargic, follows commands, moves extremities   HEENT: normocephalic  Cardiovascular: Tachy, no MRG, S1/S2 Lungs: expiratory wheezes, diminished to bases, labored breathing    Abdomen: distended, hypoactive bowel sounds, tender  Musculoskeletal: no acute deformities  Skin: warm, dry, intact     Recent Labs Lab 12/23/16 0222 12/24/16 0429 12/25/16 0332  NA 145 139 141  K 3.5 3.2* 3.6  CL 109 109 110  CO2 25 22 21*  BUN 12 6 7   CREATININE 0.57 0.44 0.41*  GLUCOSE 108* 122* 104*    Recent Labs Lab 12/23/16 0222 12/24/16 0429 12/25/16 0332  HGB 10.4* 10.2* 9.8*  HCT 33.7* 32.8* 31.0*  WBC 25.2* 24.1* 21.6*  PLT 551* 664* 602*   Ct Chest Wo Contrast  Result Date: 12/25/2016 CLINICAL DATA:  LEFT lower lobe abscess.  Bacteria pneumonia. EXAM: CT CHEST WITHOUT CONTRAST TECHNIQUE: Multidetector CT imaging of the chest was performed following the standard protocol without IV contrast. COMPARISON:  Chest CT 12/22/2016 FINDINGS: Cardiovascular: No significant vascular findings. Normal heart size. No pericardial effusion. Mediastinum/Nodes: No axillary supraclavicular adenopathy no mediastinal hilar adenopathy. No pericardial effusion esophagus normal. Lungs/Pleura:  Moderate-sized LEFT pneumothorax with gas collecting anteriorly in this supine patient (approximately 20% hemothorax volume). This fluid level at the inferior LEFT hemithorax consistent with a component of hydropneumothorax (image 110, series 4). Cavitary lesion /  pulmonary abscess in the lingula is similar in size measuring 6.2 by 3.8 cm compared to 6.3 x 3.8 cm (image 67, series 4) There is new loculated fluid at the RIGHT lung base measuring 6.3 cm in depth with a small air-fluid level. Loculated fluid along the LEFT cardiac border. There is new atelectasis of the LEFT lung base. RIGHT lung is relatively clear.  There is centrilobular emphysema Upper Abdomen: Adrenal glands normal. Musculoskeletal: Multiple bilateral healed rib fractures laterally on the LEFT and RIGHT. IMPRESSION: 1. Moderate volume LEFT hydropneumothorax slightly increased in volume from radiograph 12/24/2016. 2. No change in size of pulmonary abscess in the lingula. 3. Interval increase in loculated fluid at the LEFT lung base with scattered air pockets and lower lobe atelectasis. These results will be called to the ordering clinician or representative by the Radiologist Assistant, and communication documented in the PACS or zVision Dashboard. Electronically Signed   By: Suzy Bouchard M.D.   On: 12/25/2016 09:06   Dg Chest Port 1 View  Result Date: 12/25/2016 CLINICAL DATA:  Hypoxia and shortness of breath. EXAM: PORTABLE CHEST 1 VIEW COMPARISON:  Radiograph yesterday.  CT 12/22/2016 FINDINGS: Cavitary lesion in the left lung is again seen. Unchanged left lung base opacity may be combination of pleural fluid and airspace disease. Patient is rotated. Left pneumothorax on prior exam is not confidently identified. Minimal right lung base atelectasis. IMPRESSION: 1. Unchanged left basilar opacity likely combination of pleural fluid and airspace disease, atelectasis or pneumonia. Cavitary lesion in the periphery of the left lung is again seen. 2. Left pneumothorax on prior exam is not confidently identified. 3. Right basilar atelectasis. Electronically Signed   By: Jeb Levering M.D.   On: 12/25/2016 04:50   Dg Chest Port 1 View  Result Date: 12/24/2016 CLINICAL DATA:  Shortness of Breath,  change in behavior EXAM: PORTABLE CHEST 1 VIEW COMPARISON:  12/23/2016 FINDINGS: Cardiomediastinal silhouette is unremarkable. Stable cavitary lesion in left midlung peripheral measures 4.2 cm. Small partially loculated left pleural effusion left basilar atelectasis or infiltrate. There is small left apical pneumothorax about 4-5%. No pulmonary edema. IMPRESSION: Stable cavitary lesion left midlung peripheral. Small left pleural effusion left basilar atelectasis or infiltrate. Small left apical pneumothorax. Electronically Signed   By: Lahoma Crocker M.D.   On: 12/24/2016 12:35   Dg Chest Port 1 View  Result Date: 12/23/2016 CLINICAL DATA:  Shortness of breath. EXAM: PORTABLE CHEST 1 VIEW COMPARISON:  Radiograph of December 22, 2016. FINDINGS: Stable cardiomediastinal silhouette. No pneumothorax is noted. Mild central pulmonary vascular congestion is again noted. Stable mild loculated left pleural effusion is noted. Old right rib fractures are noted. IMPRESSION: Stable cardiomediastinal silhouette with mild central pulmonary vascular congestion. Stable mild loculated left pleural effusion. Electronically Signed   By: Marijo Conception, M.D.   On: 12/23/2016 14:09   Dg Abd Portable 1v  Result Date: 12/24/2016 CLINICAL DATA:  Abdominal pain, labored breathing EXAM: PORTABLE ABDOMEN - 1 VIEW COMPARISON:  12/23/2016 FINDINGS: Decreasing gaseous distention of large and small bowel. Gas within nondistended large small bowel currently. Oral contrast material noted within the right colon and scattered small bowel loops. IMPRESSION: Decreasing gaseous distention of large and small bowel. Electronically Signed   By: Rolm Baptise M.D.   On:  12/24/2016 11:20   Dg Abd Portable 1v  Result Date: 12/23/2016 CLINICAL DATA:  Ileus, pneumonia EXAM: PORTABLE ABDOMEN - 1 VIEW COMPARISON:  12/23/2016 FINDINGS: Mild gaseous distention of bowel, slightly improved since prior study. Oral contrast material noted within the appendix and  right colon. No free air organomegaly. Prior cholecystectomy. IMPRESSION: Some improvement in diffuse gaseous distention of bowel. Electronically Signed   By: Rolm Baptise M.D.   On: 12/23/2016 11:21    ASSESSMENT / PLAN:  Acute hypoxic respiratory failure secondary to multilobar PNA -Today on 5L [place], CXR reveals mild pulmonary vascular congestion  Asthma/COPD Exacerbation  P Maintain Saturation >88 Wean Fi02 as tolerated  Continue Solu-Medrol 40 mg BID Continue Duonebs Q6H Pulmicort BID Albuterol Q2H prn  F/U CXR   Streptococcus pneumoniae LUL abscess  P Trend Fever and WBC curve  Continue Unasyn (need for minimum of 3 weeks) Repeat CT scan in 2-3 weeks   Moderate left hydropneumothorax P Monitor  May need eval from [facility]   Acute Encephalopathy - Received 2 mg ativan overnight for agitation  H/O Bipolar  P Restart Home Medications when able to take PO  Avoid all sedative medications   Remaining of plan per Primary Team.   Hayden Pedro, AG-ACNP [facility]  Pgr: [phone]  PCCM Pgr: [phone]   ATTENDING NOTE / ATTESTATION NOTE :   I have discussed the case with the resident/APP  Hayden Pedro NP.   I agree with the resident/APP's  history, physical examination, assessment, and plans.    I have edited the above note and modified it according to our agreed history, physical examination, assessment and plan.   Patient is a 51 yo F recently discharged from the ICU after intubation (1/19 - 1/27) for acute hypoxic respiratory failure due to multilobar PNA. Her past medical history is significant for asthma, COPD, and bipolar disorder.  PCCM was called to evaluate patient again on 1/29 for increasing oxygen requirement from room air to 5L , worsening tachypnea, and tachycardia. CT abdomen performed earlier today revealed a colonic ileus without evidence of bowel obstruction. There was also note of a partially imaged pneumothorax at  the left lung base that was characterized as "tiny" on follow up chest CT. Her known left upper lobe lung abscess appeared unchanged with interval improvement in patchy airspace opacities bilaterally.   Pt had abdominal issues as well with ileus for which she refused NGT and surgery. On conservative mangement for ileus.  Her diarrhea is better (not eating though).  Last 24 hrs,  She was noted to be more tachypniec with on and off drowsiness.  Chest CT scan on 2/1 showed lingular abscess which looked similar than previous CT scan, More prominent L hydroPTX,  increased loculated LLL effusion with air pocket/atelectasis.  She remains febrile with elevated WBC count.   Vitals:  Vitals:   12/25/16 1255 12/25/16 1538 12/25/16 1600 12/25/16 1700  BP: (!) 163/96 (!) 160/91 (!) 170/101 (!) 160/98  Pulse: (!) 111 (!) 109 (!) 108 (!) 117  Resp: (!) 22 (!) 22 (!) 24 (!) 24  Temp: 98.4 F (36.9 C)   98.5 F (36.9 C)  TempSrc: Axillary   Axillary  SpO2: 97% 98% 94% 94%  Weight:      Height:        Constitutional/General: chronically ill, in mild distress. Conversant. Oriented x 3  Body mass index is 29.02 kg/m. Wt Readings from Last 3 Encounters:  12/25/16 76.7 kg (169 lb  1.5 oz)  12/01/16 75.2 kg (165 lb 12.8 oz)  08/14/16 77.1 kg (170 lb)    HEENT: PERLA, anicteric sclerae. (-) Oral thrush.with [place].   Neck: No masses. Midline trachea. No JVD, (-) LAD. (-) bruits appreciated.  Respiratory/Chest: Grossly normal chest. (-) deformity. Occasional Accessory muscle use.  Symmetric expansion. Diminished BS on both lower lung zones. More diminished on L mid-lower LF (+) crackles on L mid-base. Some wheezing on bibasilar. (-) rhonchi.    Cardiovascular: Regular rate and  rhythm, heart sounds normal, no murmur or gallops,  Trace peripheral edema  Gastrointestinal:  Normal bowel sounds. Soft, non-tender. No hepatosplenomegaly.  (-) masses.   Musculoskeletal:  Normal muscle tone.    Extremities: Grossly normal. (-) clubbing, cyanosis.  (-) edema  Skin: (-) rash,lesions seen.   Neurological/Psychiatric :CN grossly intact. (-) lateralizing signs. Awake, oriented x3   CBC Recent Labs     12/23/16  0222  12/24/16  0429  12/25/16  0332  WBC  25.2*  24.1*  21.6*  HGB  10.4*  10.2*  9.8*  HCT  33.7*  32.8*  31.0*  PLT  551*  664*  602*    Coag's No results for input(s): APTT, INR in the last 72 hours.  BMET Recent Labs     12/23/16  0222  12/24/16  0429  12/25/16  0332  NA  145  139  141  K  3.5  3.2*  3.6  CL  109  109  110  CO2  25  22  21*  BUN  12  6  7   CREATININE  0.57  0.44  0.41*  GLUCOSE  108*  122*  104*    Electrolytes Recent Labs     12/23/16  0222  12/24/16  0429  12/25/16  0332  CALCIUM  8.9  8.7*  8.5*  MG  1.8   --   1.6*  PHOS   --    --   2.4*    Sepsis Markers Recent Labs     12/25/16  0700  PROCALCITON  0.20    ABG Recent Labs     12/22/16  2315  12/24/16  1206  12/25/16  0445  PHART  7.447  7.472*  7.486*  PCO2ART  37.3  31.8*  29.8*  PO2ART  71.7*  81.3*  68.7*    Liver Enzymes Recent Labs     12/22/16  2324  12/23/16  0222  12/25/16  0332  AST  23  26  21   ALT  67*  63*  38  ALKPHOS  75  71  71  BILITOT  0.7  0.7  0.7  ALBUMIN  3.0*  2.7*  2.5*    Cardiac Enzymes No results for input(s): TROPONINI, PROBNP in the last 72 hours.  Glucose Recent Labs     12/24/16  1957  12/24/16  2324  12/25/16  0311  12/25/16  0826  12/25/16  1252  12/25/16  1717  GLUCAP  110*  96  106*  125*  127*  141*    Imaging Ct Chest Wo Contrast  Result Date: 12/25/2016 CLINICAL DATA:  LEFT lower lobe abscess.  Bacteria pneumonia. EXAM: CT CHEST WITHOUT CONTRAST TECHNIQUE: Multidetector CT imaging of the chest was performed following the standard protocol without IV contrast. COMPARISON:  Chest CT 12/22/2016 FINDINGS: Cardiovascular: No significant vascular findings. Normal heart size. No pericardial  effusion. Mediastinum/Nodes: No axillary supraclavicular adenopathy no mediastinal hilar adenopathy. No pericardial effusion  esophagus normal. Lungs/Pleura: Moderate-sized LEFT pneumothorax with gas collecting anteriorly in this supine patient (approximately 20% hemothorax volume). This fluid level at the inferior LEFT hemithorax consistent with a component of hydropneumothorax (image 110, series 4). Cavitary lesion / pulmonary abscess in the lingula is similar in size measuring 6.2 by 3.8 cm compared to 6.3 x 3.8 cm (image 67, series 4) There is new loculated fluid at the RIGHT lung base measuring 6.3 cm in depth with a small air-fluid level. Loculated fluid along the LEFT cardiac border. There is new atelectasis of the LEFT lung base. RIGHT lung is relatively clear.  There is centrilobular emphysema Upper Abdomen: Adrenal glands normal. Musculoskeletal: Multiple bilateral healed rib fractures laterally on the LEFT and RIGHT. IMPRESSION: 1. Moderate volume LEFT hydropneumothorax slightly increased in volume from radiograph 12/24/2016. 2. No change in size of pulmonary abscess in the lingula. 3. Interval increase in loculated fluid at the LEFT lung base with scattered air pockets and lower lobe atelectasis. These results will be called to the ordering clinician or representative by the Radiologist Assistant, and communication documented in the PACS or zVision Dashboard. Electronically Signed   By: Suzy Bouchard M.D.   On: 12/25/2016 09:06   Dg Chest Port 1 View  Result Date: 12/25/2016 CLINICAL DATA:  Hypoxia and shortness of breath. EXAM: PORTABLE CHEST 1 VIEW COMPARISON:  Radiograph yesterday.  CT 12/22/2016 FINDINGS: Cavitary lesion in the left lung is again seen. Unchanged left lung base opacity may be combination of pleural fluid and airspace disease. Patient is rotated. Left pneumothorax on prior exam is not confidently identified. Minimal right lung base atelectasis. IMPRESSION: 1. Unchanged left  basilar opacity likely combination of pleural fluid and airspace disease, atelectasis or pneumonia. Cavitary lesion in the periphery of the left lung is again seen. 2. Left pneumothorax on prior exam is not confidently identified. 3. Right basilar atelectasis. Electronically Signed   By: Jeb Levering M.D.   On: 12/25/2016 04:50   Dg Chest Port 1 View  Result Date: 12/24/2016 CLINICAL DATA:  Shortness of Breath, change in behavior EXAM: PORTABLE CHEST 1 VIEW COMPARISON:  12/23/2016 FINDINGS: Cardiomediastinal silhouette is unremarkable. Stable cavitary lesion in left midlung peripheral measures 4.2 cm. Small partially loculated left pleural effusion left basilar atelectasis or infiltrate. There is small left apical pneumothorax about 4-5%. No pulmonary edema. IMPRESSION: Stable cavitary lesion left midlung peripheral. Small left pleural effusion left basilar atelectasis or infiltrate. Small left apical pneumothorax. Electronically Signed   By: Lahoma Crocker M.D.   On: 12/24/2016 12:35   Dg Abd Portable 1v  Result Date: 12/24/2016 CLINICAL DATA:  Abdominal pain, labored breathing EXAM: PORTABLE ABDOMEN - 1 VIEW COMPARISON:  12/23/2016 FINDINGS: Decreasing gaseous distention of large and small bowel. Gas within nondistended large small bowel currently. Oral contrast material noted within the right colon and scattered small bowel loops. IMPRESSION: Decreasing gaseous distention of large and small bowel. Electronically Signed   By: Rolm Baptise M.D.   On: 12/24/2016 11:20    Assessment and Plan : 1. Acute hypoxemic resp failure 2/2 Lung abscess in the Lingula and LLL with streptococcus pneumonia, now with worsened pneumohydrothorax and worsened LLL loculated effusion. Likely with empyema.   - she remains on Unasyn but her CT scan is worse. Remains febrile, elevated WBC, tachypneic - I discussed with pt re: her chest ct scan findings.  I recommended surgical evaln for VATS/decortication but she said she  does NOT want any surgery.  I then suggested thoracentesis +  chest tube + tPa/DNAse but she refused this.  I mentioned there is a great chance that she will not improve and not make it if she only stays on abx.  She said she would still NOT want surgery or other invasive procedures.  She just wants antibiotics. I discussed this with the daughter and requested the daughter to come over so we can all discuss [place] again. Pt even said she did NOT want to be intubated, but wants CPR, acls meds.   - cont Abx for now.  Not sure if we have placed pt on Clindamycin before for synergistic effect  2. AECOPD - cont pulmicort BID, duoneb Q6. - cont IV steroids  3. Ileus.  - better.  - per surgery. On clears.   4. Cdiff colitis - cont Vanc PR and IV flagyl.    Family :Family updated at length today vi phone.     Monica Becton, MD 12/25/2016, 5:34 PM [facility] Pager [phone] After 3 pm or if no answer, call [phone]

## 2016-12-25 NOTE — Progress Notes (Signed)
eLink Physician-Brief Progress Note Patient Name: [name] DOB: Oct 01, 1966 MRN: [id number]   Date of Service  12/25/2016  HPI/Events of Note  Nurse reports increased WOB and RR (RR = 32). CXR from yesterday had a small  L apical pneumothorax, L mid lung cavitary lesion and Lbase atelectasis/infiltrate.   eICU Interventions  Will order: 1. Given ordered Albuterol PRN Neb. 2. Portable CXR now. 3. ABG now.  4. Will ask the ground team to evaluate the patient at bedside.      Intervention Category Major Interventions: Hypoxemia - evaluation and management  Sommer,Steven Eugene 12/25/2016, 3:55 AM

## 2016-12-25 NOTE — Progress Notes (Signed)
[facility] PCCM PROGRESS NOTE  S: 51 year old female in the midst of a prolonged hospitalization. Initially admitted for Asthma/COPD exacerbation and found to have multilobar PNA. Course complicated by Ileus, C. difficile colitis, and pulmonary cavitation/abscess. 2/1 early AM she has experienced a gradual worsening in her tachypnea and tachycardia. PCCM asked to see. Was diuresed 1/31.  She describes SOB and abdominal pain. Denies cough, chest pain, nausea, vomiting, fevers/chills.   O: BP (!) 143/74   Pulse (!) 138   Temp 99.2 F (37.3 C) (Axillary)   Resp (!) 27   Ht 5\' 4"  (1.626 m)   Wt 76.7 kg (169 lb 1.5 oz)   SpO2 94%   BMI 29.02 kg/m   General:  Female of normal body habitus in NAD Neuro:  Spontaneously awake, alert, follows commands.  HEENT:  [place]/AT, PERRL, no JVD Cardiovascular:  Tachy, regular, no MRG Lungs:  Coarse bilateral breath sounds with expiratory wheeze. Abdomen:  Soft, non-distended, generalized tenderness. Normoactive bowel sounds. Musculoskeletal:  No acute deformity Skin:  Intact, MMM   A/P: Acute respiratory failure secondary to acute exacerbation of COPD/asthma, recurrent Respiratory Alkalosis Small apical L PTX  - Solumedrol 125mg  now - Xopenex neb now - CXR and repeat ABG pending  - If does not improve and CXR without PTX could consider BiPAP for WOB.  LLL cavitary lesion Multilobar PNA > improved - Continue Unasyn - PCT algorithm  Ileus C. Difficile colitis - Flagyl IV and vanco enema - NGT out - patient refusing, ok for sips  Georgann Housekeeper, ACNP [facility] Pager [phone] or [phone]

## 2016-12-25 NOTE — Progress Notes (Signed)
[facility] Progress Note     Subjective: Sleeping. Will awake and answer questions. She is having some abdominal pain but no nausea.  Objective: Vital signs in last 24 hours: Temp:  [98.6 F (37 C)-100.1 F (37.8 C)] 99.2 F (37.3 C) (02/01 0309) Pulse Rate:  [103-138] 129 (02/01 0943) Resp:  [21-37] 21 (02/01 0943) BP: (143-179)/(74-103) 149/89 (02/01 0943) SpO2:  [94 %-97 %] 95 % (02/01 0943) Weight:  [169 lb 1.5 oz (76.7 kg)] 169 lb 1.5 oz (76.7 kg) (02/01 0407) Last BM Date: 12/24/16  Intake/Output from previous day: 01/31 0701 - 02/01 0700 In: 1305 [I.V.:805; IV Piggyback:500] Out: 2590 [Urine:2590] Intake/Output this shift: No intake/output data recorded.  PE: Gen:  Alert, NAD, sleeping but easily aroused.  Eyes: Conjunctivaeare normal. Right eye exhibits no discharge. Left eye exhibits no discharge.  Cardiovascular: Regular rhythm, normal heart sounds. Tachycardiapresent. No murmurheard. Pulmonary/Chest: Effort normal. Tachypneanoted. No respiratory distress. diffuse expiratory wheezes noted anteriorly.  Abdominal: Soft. Bowel sounds are normal. mild distension. Tympanic. Moderate generalized TTP. Skin: Skin is warm. No rashnoted. She is not diaphoretic.   Nursing noteand vitalsreviewed.  Lab Results:   Recent Labs  12/24/16 0429 12/25/16 0332  WBC 24.1* 21.6*  HGB 10.2* 9.8*  HCT 32.8* 31.0*  PLT 664* 602*   BMET  Recent Labs  12/24/16 0429 12/25/16 0332  NA 139 141  K 3.2* 3.6  CL 109 110  CO2 22 21*  GLUCOSE 122* 104*  BUN 6 7  CREATININE 0.44 0.41*  CALCIUM 8.7* 8.5*   PT/INR No results for input(s): LABPROT, INR in the last 72 hours. CMP     Component Value Date/Time   NA 141 12/25/2016 0332   K 3.6 12/25/2016 0332   CL 110 12/25/2016 0332   CO2 21 (L) 12/25/2016 0332   GLUCOSE 104 (H) 12/25/2016 0332   BUN 7 12/25/2016 0332   CREATININE 0.41 (L) 12/25/2016 0332   CALCIUM 8.5 (L) 12/25/2016 0332   PROT 5.6  (L) 12/25/2016 0332   ALBUMIN 2.5 (L) 12/25/2016 0332   AST 21 12/25/2016 0332   ALT 38 12/25/2016 0332   ALKPHOS 71 12/25/2016 0332   BILITOT 0.7 12/25/2016 0332   GFRNONAA >60 12/25/2016 0332   GFRAA >60 12/25/2016 0332   Lipase     Component Value Date/Time   LIPASE 12 12/12/2015 1714       Studies/Results: Ct Chest Wo Contrast  Result Date: 12/25/2016 CLINICAL DATA:  LEFT lower lobe abscess.  Bacteria pneumonia. EXAM: CT CHEST WITHOUT CONTRAST TECHNIQUE: Multidetector CT imaging of the chest was performed following the standard protocol without IV contrast. COMPARISON:  Chest CT 12/22/2016 FINDINGS: Cardiovascular: No significant vascular findings. Normal heart size. No pericardial effusion. Mediastinum/Nodes: No axillary supraclavicular adenopathy no mediastinal hilar adenopathy. No pericardial effusion esophagus normal. Lungs/Pleura: Moderate-sized LEFT pneumothorax with gas collecting anteriorly in this supine patient (approximately 20% hemothorax volume). This fluid level at the inferior LEFT hemithorax consistent with a component of hydropneumothorax (image 110, series 4). Cavitary lesion / pulmonary abscess in the lingula is similar in size measuring 6.2 by 3.8 cm compared to 6.3 x 3.8 cm (image 67, series 4) There is new loculated fluid at the RIGHT lung base measuring 6.3 cm in depth with a small air-fluid level. Loculated fluid along the LEFT cardiac border. There is new atelectasis of the LEFT lung base. RIGHT lung is relatively clear.  There is centrilobular emphysema Upper Abdomen: Adrenal glands normal. Musculoskeletal: Multiple bilateral healed rib  fractures laterally on the LEFT and RIGHT. IMPRESSION: 1. Moderate volume LEFT hydropneumothorax slightly increased in volume from radiograph 12/24/2016. 2. No change in size of pulmonary abscess in the lingula. 3. Interval increase in loculated fluid at the LEFT lung base with scattered air pockets and lower lobe atelectasis. These  results will be called to the ordering clinician or representative by the Radiologist Assistant, and communication documented in the PACS or zVision Dashboard. Electronically Signed   By: Suzy Bouchard M.D.   On: 12/25/2016 09:06   Dg Chest Port 1 View  Result Date: 12/25/2016 CLINICAL DATA:  Hypoxia and shortness of breath. EXAM: PORTABLE CHEST 1 VIEW COMPARISON:  Radiograph yesterday.  CT 12/22/2016 FINDINGS: Cavitary lesion in the left lung is again seen. Unchanged left lung base opacity may be combination of pleural fluid and airspace disease. Patient is rotated. Left pneumothorax on prior exam is not confidently identified. Minimal right lung base atelectasis. IMPRESSION: 1. Unchanged left basilar opacity likely combination of pleural fluid and airspace disease, atelectasis or pneumonia. Cavitary lesion in the periphery of the left lung is again seen. 2. Left pneumothorax on prior exam is not confidently identified. 3. Right basilar atelectasis. Electronically Signed   By: Jeb Levering M.D.   On: 12/25/2016 04:50   Dg Chest Port 1 View  Result Date: 12/24/2016 CLINICAL DATA:  Shortness of Breath, change in behavior EXAM: PORTABLE CHEST 1 VIEW COMPARISON:  12/23/2016 FINDINGS: Cardiomediastinal silhouette is unremarkable. Stable cavitary lesion in left midlung peripheral measures 4.2 cm. Small partially loculated left pleural effusion left basilar atelectasis or infiltrate. There is small left apical pneumothorax about 4-5%. No pulmonary edema. IMPRESSION: Stable cavitary lesion left midlung peripheral. Small left pleural effusion left basilar atelectasis or infiltrate. Small left apical pneumothorax. Electronically Signed   By: Lahoma Crocker M.D.   On: 12/24/2016 12:35   Dg Chest Port 1 View  Result Date: 12/23/2016 CLINICAL DATA:  Shortness of breath. EXAM: PORTABLE CHEST 1 VIEW COMPARISON:  Radiograph of December 22, 2016. FINDINGS: Stable cardiomediastinal silhouette. No pneumothorax is noted.  Mild central pulmonary vascular congestion is again noted. Stable mild loculated left pleural effusion is noted. Old right rib fractures are noted. IMPRESSION: Stable cardiomediastinal silhouette with mild central pulmonary vascular congestion. Stable mild loculated left pleural effusion. Electronically Signed   By: Marijo Conception, M.D.   On: 12/23/2016 14:09   Dg Abd Portable 1v  Result Date: 12/24/2016 CLINICAL DATA:  Abdominal pain, labored breathing EXAM: PORTABLE ABDOMEN - 1 VIEW COMPARISON:  12/23/2016 FINDINGS: Decreasing gaseous distention of large and small bowel. Gas within nondistended large small bowel currently. Oral contrast material noted within the right colon and scattered small bowel loops. IMPRESSION: Decreasing gaseous distention of large and small bowel. Electronically Signed   By: Rolm Baptise M.D.   On: 12/24/2016 11:20   Dg Abd Portable 1v  Result Date: 12/23/2016 CLINICAL DATA:  Ileus, pneumonia EXAM: PORTABLE ABDOMEN - 1 VIEW COMPARISON:  12/23/2016 FINDINGS: Mild gaseous distention of bowel, slightly improved since prior study. Oral contrast material noted within the appendix and right colon. No free air organomegaly. Prior cholecystectomy. IMPRESSION: Some improvement in diffuse gaseous distention of bowel. Electronically Signed   By: Rolm Baptise M.D.   On: 12/23/2016 11:21    Anti-infectives: Anti-infectives    Start     Dose/Rate Route Frequency Ordered Stop   12/23/16 0130  vancomycin (VANCOCIN) 500 mg in sodium chloride irrigation 0.9 % 100 mL ENEMA  500 mg Rectal Every 6 hours 12/23/16 0049     12/22/16 2354  metroNIDAZOLE (FLAGYL) IVPB 500 mg     500 mg 100 mL/hr over 60 Minutes Intravenous Every 8 hours 12/22/16 2354     12/22/16 2200  vancomycin (VANCOCIN) 50 mg/mL oral solution 125 mg  Status:  Discontinued     125 mg Oral 4 times daily 12/22/16 2058 12/23/16 0049   12/22/16 1000  vancomycin (VANCOCIN) 50 mg/mL oral solution 125 mg  Status:  Discontinued      125 mg Per Tube 4 times daily 12/22/16 0947 12/22/16 2058   12/17/16 1200  Ampicillin-Sulbactam (UNASYN) 3 g in sodium chloride 0.9 % 100 mL IVPB     3 g 200 mL/hr over 30 Minutes Intravenous Every 6 hours 12/17/16 1144     12/15/16 1015  acyclovir (ZOVIRAX) 200 MG/5ML suspension SUSP 400 mg     400 mg Per Tube 3 times daily 12/15/16 1000 12/22/16 0959   12/13/16 2000  azithromycin (ZITHROMAX) 500 mg in dextrose 5 % 250 mL IVPB  Status:  Discontinued     500 mg 250 mL/hr over 60 Minutes Intravenous Every 24 hours 12/12/16 2241 12/15/16 0957   12/13/16 2000  cefTRIAXone (ROCEPHIN) 1 g in dextrose 5 % 50 mL IVPB  Status:  Discontinued     1 g 100 mL/hr over 30 Minutes Intravenous Every 24 hours 12/12/16 2241 12/17/16 1144   12/12/16 2015  cefTRIAXone (ROCEPHIN) 1 g in dextrose 5 % 50 mL IVPB     1 g 100 mL/hr over 30 Minutes Intravenous  Once 12/12/16 2013 12/12/16 2110   12/12/16 2015  azithromycin (ZITHROMAX) 500 mg in dextrose 5 % 250 mL IVPB     500 mg 250 mL/hr over 60 Minutes Intravenous  Once 12/12/16 2013 12/12/16 2225       Assessment/Plan   Problem list Acute hypoxic respiratory failure secondary to Multilobar PNAwith small pneumothorax Multilobar pneumonia with strep pneumonia with cavitary lesion - strep pneumo. Demand ischemia with history of grade 2 diastolic dysfunction AKI - Resolved Acute asthma exacerbation Ischemic hepatitis - At admission AST was 2024 and ALT was 1366. Most likely due to shock liver. Hepatitis panel negative. LFTs have improved. Dysphagia - modified barium swallow on 1/29, cleared for dysphagia 1 diet.  Leukocytosis Acute Anemia Mouth sores - acyclovir. Acute encephalopathy Acute CVA Bipolar d/o  Colonic Ileus with C. Diff - abdominal pain and bloating, imaging concerning for colonic ileus. - +C. Diff, concerns for developing into toxic megacolon.  - pt pulled NGT and is refusing another one.  - Abd xray yesterday showed improvement  in bowel distention. - receiving Vanc enemas through rectal tube, C. Diff treatment per primary - try to keep electrolytes as normal as possible - sips and chips as tolerated, PO meds okay  Okay for PO fluids for Speech to do swallow eval. Diet to be determined pending swallow eval. Ideally pt will be started back on clears.   - - no surgical intervention is indicated at this time. We will continue to follow the pt    LOS: 13 days    Kalman Drape , [facility] 12/25/2016, 10:07 AM Pager: [phone] Consults: [phone] Mon-Fri 7:00 am-4:30 pm Sat-Sun 7:00 am-11:30 am

## 2016-12-25 NOTE — Progress Notes (Signed)
Speech Language Pathology Treatment: Dysphagia  Patient Details Name: [name] MRN: [id number] DOB: Dec 26, 1965 Today's Date: 12/25/2016 Time: [id number] SLP Time Calculation (min) (ACUTE ONLY): 15 min  Assessment / Plan / Recommendation Clinical Impression  Patient seen with daughter at bedside. Patient adamantly refuses oral care. Provided education to patient and daughter regarding role of oral care in reducing risks for aspiration, encouraged patient to allow RN to complete oral care throughout the day. MBS was performed 1/29, with recommendations for dysphagia 1 with honey-thick liquids, however due to abdominal pain patient has been unable to initiate diet. She has been medically approved for clear liquids. Attempted PO trials of honey-thick liquids. Patient accepted one bolus via teaspoon with no overt signs or symptoms of aspiration, however she declined all further PO trials, complaining of stomach pain. Recommend continuing NPO with the exceptions of medications to be given whole with honey thick liquids. SLP will f/u to determine readiness to begin PO diet.   HPI HPI: 51 y/o F, 30 pk year smoker, with Hx of COPD/asthma and bipolar disorder admitted 1/19 with SOB. Working diagnosis of acute asthma exacerbation, multilobar PNA with resultant hypoxic respiratory failure. Intubated 12/12/16 to 12/20/16.       SLP Plan  Continue with current plan of care     Recommendations  Diet recommendations: NPO (Patient may require temporary means of nutrition/hydration) Medication Administration:  (Whole with honey thick liquid) Supervision: Staff to assist with self feeding Compensations: Slow rate;Small sips/bites Postural Changes and/or Swallow Maneuvers: Seated upright 90 degrees                Oral Care Recommendations: Oral care QID Follow up Recommendations: Skilled Nursing facility Plan: Continue with current plan of care  Deneise Lever, [place] CF-SLP Speech-Language  Pathologist [place] 12/25/2016, 4:36 PM

## 2016-12-25 NOTE — Progress Notes (Signed)
PCCM ATTENDING ROUNDING NOTE  Subjective:  51 y.o. female with hospitalization for Streptococcus pneumoniae pneumonia and abscess formation within her left lung. Patient has developed C. difficile colitis during this admission and was previously intubated for an exacerbation of underlying asthma/COPD. Earlier patient was assessed by nurse practitioner for increased shortness of breath and abdominal pain or nausea. At the time of my exam she reports her dyspnea has improved. She does endorse intermittent nonproductive cough. Patient did receive Ativan as needed albuterol nebulizer treatment a little after 4 AM this morning for her symptoms along with Solu-Medrol 125 mg IV.  Review of Systems:  Reports a mild headache but no vision changes. Denies any chest pain, tightness, or pressure. Denies any subjective fever or sweats but is experiencing some chills.  Objective: BP (!) 143/74   Pulse (!) 124   Temp 99.2 F (37.3 C) (Axillary)   Resp (!) 28   Ht 5\' 4"  (1.626 m)   Wt 169 lb 1.5 oz (76.7 kg)   SpO2 97%   BMI 29.02 kg/m  General:  Awake. Eyes closed. No acute distress. Nurse at bedside.  Integument:  Warm & dry. No rash on exposed skin.  Extremities:  No cyanosis.  HEENT:  Tacky mucus membranes. No scleral injection or icterus.  Cardiovascular:  Mildly tachycardic with regular rhythm. No edema. No appreciable JVD.  Pulmonary:  Slightly diminished aeration bilateral lung bases. Mildly increased work of breathing on nasal cannula oxygen. Prolonged exhalation phase with end expiratory wheezing especially in the bases. Abdomen: Soft. Normal bowel sounds. Nondistended. Tender to light palpation. Neurological: Oriented to person, year, president, and hospital. Moving all 4 extremities equally. Grossly nonfocal. No meningismus.  CBC Latest Ref Rng & Units 12/25/2016 12/24/2016 12/23/2016  WBC 4.0 - 10.5 K/uL 21.6(H) 24.1(H) 25.2(H)  Hemoglobin 12.0 - 15.0 g/dL 9.8(L) 10.2(L) 10.4(L)  Hematocrit  36.0 - 46.0 % 31.0(L) 32.8(L) 33.7(L)  Platelets 150 - 400 K/uL 602(H) 664(H) 551(H)   BMP Latest Ref Rng & Units 12/25/2016 12/24/2016 12/23/2016  Glucose 65 - 99 mg/dL 104(H) 122(H) 108(H)  BUN 6 - 20 mg/dL 7 6 12   Creatinine 0.44 - 1.00 mg/dL 0.41(L) 0.44 0.57  Sodium 135 - 145 mmol/L 141 139 145  Potassium 3.5 - 5.1 mmol/L 3.6 3.2(L) 3.5  Chloride 101 - 111 mmol/L 110 109 109  CO2 22 - 32 mmol/L 21(L) 22 25  Calcium 8.9 - 10.3 mg/dL 8.5(L) 8.7(L) 8.9   ABG on 5 L/m:  7.486 / 29.8 / 68.7  PORT CXR 12/25/16:  Personally reviewed by me. Patient has persistent opacification in her left lower lung which appears slightly worsened compared with previous imaging. Rim of abscess appreciated. No obvious pneumothorax appreciated.  MICROBIOLOGY: Blood Cultures x2 1/19:  Negative MRSA PCR 1/20:  Negative Respiratory Panel PCR 1/20:  Negative  HIV 1/20:  Nonreactive Tracheal Aspirate Culture 1/20:  Streptococcus pneumoniae Blood Cultures x2 1/24 >> Stool Toxigenic C diff PCR 1/28:  Positive  A/P:  51 y.o. female admitted with an exacerbation of underlying asthma versus COPD secondary to Streptococcus pneumoniae pneumonia which has resulted in a left lung abscess and further complication with C. difficile colitis. Patient's symptoms seem to improve this morning after her dose of albuterol nebulized. I suspect this was simply a mild worsening of her underlying asthma/COPD. I personally reviewed her chest x-ray which does not show any obvious worsening or presence of her previous left pneumothorax. The opacity within her left lower lobe does seem to be  worsening which makes me concerned that her underlying pneumonia/lung abscess is worsening as well. And given my concern on previous CT imaging for the development of a parapneumonic effusion due to rupture of the abscess through the pleural space I feel further imaging is necessary. Given her potential for further clinical worsening she will need to be  monitored closely as her respiratory status remains tenuous.  1. Asthma/COPD with Exacerbation: Worsening clinically. Continuing Solu-Medrol 40 mg IV every 12 hours. Continuing Pulmicort nebulized twice daily and Duonebs scheduled every 6 hours. Continuing albuterol nebulized every 2 hours as needed. Holding on escalation of IV steroids at this time. 2. Multi lobar Streptococcus pneumoniae pneumonia w/ left lower lobe abscess: Patient continuing on Unasyn. Checking CT chest without contrast to assess for possible worsening/development of parapneumonic effusion. 3. Acute hypoxic respiratory failure: Worsening. Patient now requiring 5 L/m when yesterday was only requiring 3 L/m. Checking CT chest without contrast to evaluate for developing parapneumonic effusion. Continuing supplemental oxygen. 4. Tiny left pneumothorax: Not evident on repeat chest x-ray imaging by my review. Checking CT chest without contrast to better evaluate.  Remainder of care as per primary service. We will continue to follow closely given her tenuous status.  Sonia Baller Ashok Cordia, M.D. [facility] Pager:  [phone] After 3pm or if no response, call [phone] 6:16 AM 12/25/16

## 2016-12-26 ENCOUNTER — Inpatient Hospital Stay (HOSPITAL_COMMUNITY): Payer: Medicaid Other | Admitting: Certified Registered"

## 2016-12-26 ENCOUNTER — Encounter (HOSPITAL_COMMUNITY): Payer: Self-pay | Admitting: Certified Registered"

## 2016-12-26 ENCOUNTER — Inpatient Hospital Stay (HOSPITAL_COMMUNITY): Payer: Medicaid Other

## 2016-12-26 ENCOUNTER — Encounter (HOSPITAL_COMMUNITY)
Admission: EM | Disposition: A | Discharge status: Skilled Nursing Facility | Payer: Self-pay | Source: Home / Self Care | Attending: Internal Medicine

## 2016-12-26 DIAGNOSIS — J984 Other disorders of lung: Secondary | ICD-10-CM

## 2016-12-26 DIAGNOSIS — J189 Pneumonia, unspecified organism: Secondary | ICD-10-CM

## 2016-12-26 DIAGNOSIS — J948 Other specified pleural conditions: Secondary | ICD-10-CM

## 2016-12-26 DIAGNOSIS — J852 Abscess of lung without pneumonia: Secondary | ICD-10-CM

## 2016-12-26 DIAGNOSIS — J869 Pyothorax without fistula: Secondary | ICD-10-CM | POA: Diagnosis present

## 2016-12-26 HISTORY — PX: VIDEO ASSISTED THORACOSCOPY (VATS)/DECORTICATION: SHX6171

## 2016-12-26 HISTORY — PX: VIDEO BRONCHOSCOPY: SHX5072

## 2016-12-26 LAB — BLOOD GAS, ARTERIAL
Acid-base deficit: 3.1 mmol/L — ABNORMAL HIGH (ref 0.0–2.0)
Bicarbonate: 20.9 mmol/L (ref 20.0–28.0)
Drawn by: 441371
FIO2: 21
O2 Saturation: 86.1 %
Patient temperature: 98.6
pCO2 arterial: 34 mmHg (ref 32.0–48.0)
pH, Arterial: 7.405 (ref 7.350–7.450)
pO2, Arterial: 54.5 mmHg — ABNORMAL LOW (ref 83.0–108.0)

## 2016-12-26 LAB — COMPREHENSIVE METABOLIC PANEL
ALBUMIN: 2.2 g/dL — AB (ref 3.5–5.0)
ALT: 29 U/L (ref 14–54)
ALT: 28 U/L (ref 14–54)
ANION GAP: 9 (ref 5–15)
AST: 14 U/L — ABNORMAL LOW (ref 15–41)
AST: 15 U/L (ref 15–41)
Albumin: 2.2 g/dL — ABNORMAL LOW (ref 3.5–5.0)
Alkaline Phosphatase: 62 U/L (ref 38–126)
Alkaline Phosphatase: 59 U/L (ref 38–126)
Anion gap: 10 (ref 5–15)
BILIRUBIN TOTAL: 0.3 mg/dL (ref 0.3–1.2)
BUN: 10 mg/dL (ref 6–20)
BUN: 11 mg/dL (ref 6–20)
CHLORIDE: 112 mmol/L — AB (ref 101–111)
CO2: 20 mmol/L — ABNORMAL LOW (ref 22–32)
CO2: 20 mmol/L — ABNORMAL LOW (ref 22–32)
Calcium: 8.5 mg/dL — ABNORMAL LOW (ref 8.9–10.3)
Calcium: 8.6 mg/dL — ABNORMAL LOW (ref 8.9–10.3)
Chloride: 114 mmol/L — ABNORMAL HIGH (ref 101–111)
Creatinine, Ser: 0.46 mg/dL (ref 0.44–1.00)
Creatinine, Ser: 0.48 mg/dL (ref 0.44–1.00)
GFR calc Af Amer: >60 mL/min (ref >60)
GFR calc Af Amer: >60 mL/min (ref >60)
GFR calc non Af Amer: >60 mL/min (ref >60)
GFR calc non Af Amer: >60 mL/min (ref >60)
GLUCOSE: 128 mg/dL — AB (ref 65–99)
Glucose, Bld: 135 mg/dL — ABNORMAL HIGH (ref 65–99)
POTASSIUM: 4 mmol/L (ref 3.5–5.1)
Potassium: 3.9 mmol/L (ref 3.5–5.1)
Sodium: 141 mmol/L (ref 135–145)
Sodium: 144 mmol/L (ref 135–145)
TOTAL PROTEIN: 5.5 g/dL — AB (ref 6.5–8.1)
Total Bilirubin: 0.5 mg/dL (ref 0.3–1.2)
Total Protein: 5.4 g/dL — ABNORMAL LOW (ref 6.5–8.1)

## 2016-12-26 LAB — POCT I-STAT 3, ART BLOOD GAS (G3+)
ACID-BASE DEFICIT: 5 mmol/L — AB (ref 0.0–2.0)
Bicarbonate: 21.3 mmol/L (ref 20.0–28.0)
O2 SAT: 96 %
PH ART: 7.291 — AB (ref 7.350–7.450)
PO2 ART: 94 mmHg (ref 83.0–108.0)
Patient temperature: 98.4
TCO2: 23 mmol/L (ref 0–100)
pCO2 arterial: 44 mmHg (ref 32.0–48.0)

## 2016-12-26 LAB — GLUCOSE, CAPILLARY
GLUCOSE-CAPILLARY: 110 mg/dL — AB (ref 65–99)
GLUCOSE-CAPILLARY: 140 mg/dL — AB (ref 65–99)
GLUCOSE-CAPILLARY: 157 mg/dL — AB (ref 65–99)
Glucose-Capillary: 128 mg/dL — ABNORMAL HIGH (ref 65–99)
Glucose-Capillary: 122 mg/dL — ABNORMAL HIGH (ref 65–99)

## 2016-12-26 LAB — CBC
HCT: 29.1 % — ABNORMAL LOW (ref 36.0–46.0)
HEMATOCRIT: 28.6 % — AB (ref 36.0–46.0)
Hemoglobin: 9 g/dL — ABNORMAL LOW (ref 12.0–15.0)
Hemoglobin: 9.3 g/dL — ABNORMAL LOW (ref 12.0–15.0)
MCH: 28.6 pg (ref 26.0–34.0)
MCH: 29.2 pg (ref 26.0–34.0)
MCHC: 31.5 g/dL (ref 30.0–36.0)
MCHC: 32 g/dL (ref 30.0–36.0)
MCV: 90.8 fL (ref 78.0–100.0)
MCV: 91.5 fL (ref 78.0–100.0)
PLATELETS: 648 10*3/uL — AB (ref 150–400)
Platelets: 657 10*3/uL — ABNORMAL HIGH (ref 150–400)
RBC: 3.15 MIL/uL — ABNORMAL LOW (ref 3.87–5.11)
RBC: 3.18 MIL/uL — ABNORMAL LOW (ref 3.87–5.11)
RDW: 16.7 % — AB (ref 11.5–15.5)
RDW: 16.8 % — ABNORMAL HIGH (ref 11.5–15.5)
WBC: 21.6 10*3/uL — AB (ref 4.0–10.5)
WBC: 19.6 10*3/uL — ABNORMAL HIGH (ref 4.0–10.5)

## 2016-12-26 LAB — ABO/RH: ABO/RH(D): A NEG

## 2016-12-26 LAB — PREPARE RBC (CROSSMATCH)

## 2016-12-26 LAB — APTT: aPTT: 37 seconds — ABNORMAL HIGH (ref 24–36)

## 2016-12-26 LAB — PROTIME-INR
INR: 1.41
Prothrombin Time: 17.4 seconds — ABNORMAL HIGH (ref 11.4–15.2)

## 2016-12-26 LAB — TRIGLYCERIDES: Triglycerides: 156 mg/dL — ABNORMAL HIGH (ref <150)

## 2016-12-26 SURGERY — VIDEO ASSISTED THORACOSCOPY (VATS)/DECORTICATION
Anesthesia: General | Site: Chest

## 2016-12-26 MED ORDER — ROCURONIUM BROMIDE 50 MG/5ML IV SOSY
PREFILLED_SYRINGE | INTRAVENOUS | Status: AC
  Filled 2016-12-26: qty 15

## 2016-12-26 MED ORDER — MIDAZOLAM HCL 2 MG/2ML IJ SOLN
INTRAMUSCULAR | Status: AC
  Filled 2016-12-26: qty 2

## 2016-12-26 MED ORDER — PROPOFOL 500 MG/50ML IV EMUL: INTRAVENOUS | Status: DC | PRN

## 2016-12-26 MED ORDER — FENTANYL CITRATE (PF) 100 MCG/2ML IJ SOLN
INTRAMUSCULAR | Status: AC
  Filled 2016-12-26: qty 2

## 2016-12-26 MED ORDER — ESCITALOPRAM OXALATE 10 MG PO TABS
40.0000 mg | ORAL_TABLET | Freq: Every day | ORAL | Status: DC
  Administered 2016-12-27 – 2017-01-05 (×10): 40 mg via ORAL
  Filled 2016-12-26 (×10): qty 4

## 2016-12-26 MED ORDER — METRONIDAZOLE IN NACL 5-0.79 MG/ML-% IV SOLN
500.0000 mg | Freq: Three times a day (TID) | INTRAVENOUS | Status: DC
  Administered 2016-12-27 – 2016-12-31 (×14): 500 mg via INTRAVENOUS
  Filled 2016-12-26 (×14): qty 100

## 2016-12-26 MED ORDER — LACTATED RINGERS IV SOLN
INTRAVENOUS | Status: DC | PRN
Start: 2016-12-26 — End: 2016-12-26
  Administered 2016-12-26: 13:00:00 via INTRAVENOUS

## 2016-12-26 MED ORDER — ORAL CARE MOUTH RINSE
15.0000 mL | OROMUCOSAL | Status: DC
  Administered 2016-12-26 – 2016-12-28 (×21): 15 mL via OROMUCOSAL

## 2016-12-26 MED ORDER — BUDESONIDE 0.5 MG/2ML IN SUSP
0.5000 mg | Freq: Two times a day (BID) | RESPIRATORY_TRACT | Status: DC
  Administered 2016-12-27 – 2017-01-12 (×20): 0.5 mg via RESPIRATORY_TRACT
  Filled 2016-12-26 (×30): qty 2

## 2016-12-26 MED ORDER — SODIUM CHLORIDE 0.9 % IV SOLN
25.0000 ug/h | INTRAVENOUS | Status: DC
  Administered 2016-12-26: 50 ug/h via INTRAVENOUS
  Administered 2016-12-27 (×2): 200 ug/h via INTRAVENOUS
  Administered 2016-12-28: 250 ug/h via INTRAVENOUS
  Filled 2016-12-26 (×5): qty 50

## 2016-12-26 MED ORDER — PROPOFOL 10 MG/ML IV BOLUS
INTRAVENOUS | Status: AC
  Filled 2016-12-26: qty 20

## 2016-12-26 MED ORDER — HEMOSTATIC AGENTS (NO CHARGE) OPTIME
TOPICAL | Status: DC | PRN
  Administered 2016-12-26: 1 via TOPICAL

## 2016-12-26 MED ORDER — BISACODYL 5 MG PO TBEC
10.0000 mg | DELAYED_RELEASE_TABLET | Freq: Every day | ORAL | Status: DC
  Administered 2017-01-07: 10 mg via ORAL
  Filled 2016-12-26 (×2): qty 2

## 2016-12-26 MED ORDER — MIDAZOLAM HCL 2 MG/2ML IJ SOLN
2.0000 mg | INTRAMUSCULAR | Status: DC | PRN
  Administered 2016-12-27 – 2016-12-28 (×4): 2 mg via INTRAVENOUS
  Filled 2016-12-26 (×3): qty 2

## 2016-12-26 MED ORDER — PROPOFOL 1000 MG/100ML IV EMUL: 5.0000 ug/kg/min | INTRAVENOUS | Status: DC

## 2016-12-26 MED ORDER — PIPERACILLIN-TAZOBACTAM 3.375 G IVPB
3.3750 g | Freq: Three times a day (TID) | INTRAVENOUS | Status: DC
  Administered 2016-12-26 – 2016-12-31 (×14): 3.375 g via INTRAVENOUS
  Filled 2016-12-26 (×15): qty 50

## 2016-12-26 MED ORDER — PROPOFOL 1000 MG/100ML IV EMUL
0.0000 ug/kg/min | INTRAVENOUS | Status: DC
  Administered 2016-12-26: 40 ug/kg/min via INTRAVENOUS
  Administered 2016-12-27 (×2): 30 ug/kg/min via INTRAVENOUS
  Filled 2016-12-26 (×3): qty 100

## 2016-12-26 MED ORDER — GABAPENTIN 300 MG PO CAPS
300.0000 mg | ORAL_CAPSULE | Freq: Three times a day (TID) | ORAL | Status: DC
  Administered 2016-12-27 – 2017-01-05 (×30): 300 mg via ORAL
  Filled 2016-12-26 (×30): qty 1

## 2016-12-26 MED ORDER — ROCURONIUM BROMIDE 100 MG/10ML IV SOLN
INTRAVENOUS | Status: DC | PRN
  Administered 2016-12-26 (×3): 50 mg via INTRAVENOUS
  Administered 2016-12-26: 30 mg via INTRAVENOUS

## 2016-12-26 MED ORDER — METHYLPREDNISOLONE SODIUM SUCC 40 MG IJ SOLR
20.0000 mg | Freq: Two times a day (BID) | INTRAMUSCULAR | Status: DC
  Administered 2016-12-27 – 2016-12-29 (×5): 20 mg via INTRAVENOUS
  Filled 2016-12-26 (×5): qty 0.5

## 2016-12-26 MED ORDER — 0.9 % SODIUM CHLORIDE (POUR BTL) OPTIME
TOPICAL | Status: DC | PRN
  Administered 2016-12-26: 2000 mL

## 2016-12-26 MED ORDER — FENTANYL CITRATE (PF) 100 MCG/2ML IJ SOLN
INTRAMUSCULAR | Status: DC | PRN
  Administered 2016-12-26: 50 ug via INTRAVENOUS
  Administered 2016-12-26: 100 ug via INTRAVENOUS
  Administered 2016-12-26: 50 ug via INTRAVENOUS
  Administered 2016-12-26: 100 ug via INTRAVENOUS

## 2016-12-26 MED ORDER — METHYLPREDNISOLONE SODIUM SUCC 40 MG IJ SOLR
20.0000 mg | Freq: Two times a day (BID) | INTRAMUSCULAR | Status: DC
  Filled 2016-12-26: qty 1

## 2016-12-26 MED ORDER — SENNOSIDES-DOCUSATE SODIUM 8.6-50 MG PO TABS
1.0000 | ORAL_TABLET | Freq: Every day | ORAL | Status: DC
  Administered 2017-01-11: 1 via ORAL
  Filled 2016-12-26 (×4): qty 1

## 2016-12-26 MED ORDER — PROPOFOL 500 MG/50ML IV EMUL
INTRAVENOUS | Status: DC | PRN
  Administered 2016-12-26: 50 ug/kg/min via INTRAVENOUS

## 2016-12-26 MED ORDER — CEFAZOLIN SODIUM-DEXTROSE 2-3 GM-% IV SOLR
INTRAVENOUS | Status: DC | PRN
  Administered 2016-12-26: 2 g via INTRAVENOUS

## 2016-12-26 MED ORDER — DEXTROSE-NACL 5-0.45 % IV SOLN
INTRAVENOUS | Status: DC
  Administered 2016-12-26 – 2016-12-27 (×2): via INTRAVENOUS

## 2016-12-26 MED ORDER — FENTANYL CITRATE (PF) 100 MCG/2ML IJ SOLN: 25.0000 ug | INTRAMUSCULAR | Status: DC | PRN

## 2016-12-26 MED ORDER — ONDANSETRON HCL 4 MG/2ML IJ SOLN
4.0000 mg | Freq: Four times a day (QID) | INTRAMUSCULAR | Status: DC | PRN
  Administered 2017-01-01 – 2017-01-11 (×5): 4 mg via INTRAVENOUS
  Filled 2016-12-26 (×6): qty 2

## 2016-12-26 MED ORDER — SUCCINYLCHOLINE CHLORIDE 20 MG/ML IJ SOLN
INTRAMUSCULAR | Status: DC | PRN
  Administered 2016-12-26: 100 mg via INTRAVENOUS

## 2016-12-26 MED ORDER — OXYCODONE HCL 5 MG PO TABS: 5.0000 mg | ORAL_TABLET | ORAL | Status: DC | PRN

## 2016-12-26 MED ORDER — MIDAZOLAM HCL 5 MG/5ML IJ SOLN
INTRAMUSCULAR | Status: DC | PRN
  Administered 2016-12-26 (×2): 1 mg via INTRAVENOUS

## 2016-12-26 MED ORDER — SODIUM CHLORIDE 0.9 % IV SOLN
30.0000 meq | Freq: Every day | INTRAVENOUS | Status: DC | PRN
Start: 2016-12-26 — End: 2017-01-12
  Administered 2016-12-30 – 2017-01-03 (×4): 30 meq via INTRAVENOUS
  Filled 2016-12-26 (×7): qty 15

## 2016-12-26 MED ORDER — FENTANYL CITRATE (PF) 100 MCG/2ML IJ SOLN
50.0000 ug | Freq: Once | INTRAMUSCULAR | Status: AC
  Administered 2016-12-26: 50 ug via INTRAVENOUS
  Filled 2016-12-26: qty 2

## 2016-12-26 MED ORDER — MIDAZOLAM HCL 2 MG/2ML IJ SOLN
2.0000 mg | INTRAMUSCULAR | Status: DC | PRN
  Administered 2016-12-27 (×2): 2 mg via INTRAVENOUS
  Filled 2016-12-26 (×3): qty 2

## 2016-12-26 MED ORDER — HALOPERIDOL LACTATE 5 MG/ML IJ SOLN
2.0000 mg | Freq: Four times a day (QID) | INTRAMUSCULAR | Status: DC | PRN
  Administered 2016-12-26: 2 mg via INTRAVENOUS
  Filled 2016-12-26: qty 1

## 2016-12-26 MED ORDER — FENTANYL BOLUS VIA INFUSION
50.0000 ug | INTRAVENOUS | Status: DC | PRN
  Filled 2016-12-26: qty 50

## 2016-12-26 MED ORDER — PROPOFOL 10 MG/ML IV BOLUS
INTRAVENOUS | Status: DC | PRN
  Administered 2016-12-26: 30 mg via INTRAVENOUS
  Administered 2016-12-26: 150 mg via INTRAVENOUS
  Administered 2016-12-26: 20 mg via INTRAVENOUS

## 2016-12-26 MED ORDER — TRAMADOL HCL 50 MG PO TABS: 50.0000 mg | ORAL_TABLET | Freq: Four times a day (QID) | ORAL | Status: DC | PRN

## 2016-12-26 MED ORDER — PANTOPRAZOLE SODIUM 40 MG IV SOLR
40.0000 mg | INTRAVENOUS | Status: DC
  Administered 2016-12-26 – 2017-01-01 (×7): 40 mg via INTRAVENOUS
  Filled 2016-12-26 (×7): qty 40

## 2016-12-26 MED ORDER — INSULIN ASPART 100 UNIT/ML ~~LOC~~ SOLN
0.0000 [IU] | SUBCUTANEOUS | Status: DC
  Administered 2016-12-27: 2 [IU] via SUBCUTANEOUS
  Administered 2016-12-28: 4 [IU] via SUBCUTANEOUS
  Administered 2016-12-28: 2 [IU] via SUBCUTANEOUS
  Administered 2016-12-29: 4 [IU] via SUBCUTANEOUS
  Administered 2016-12-29 – 2017-01-01 (×10): 2 [IU] via SUBCUTANEOUS

## 2016-12-26 MED ORDER — QUETIAPINE FUMARATE 100 MG PO TABS
100.0000 mg | ORAL_TABLET | Freq: Every day | ORAL | Status: DC
  Administered 2016-12-27 – 2017-01-05 (×10): 100 mg via ORAL
  Filled 2016-12-26 (×10): qty 1

## 2016-12-26 MED ORDER — LAMOTRIGINE 100 MG PO TABS
100.0000 mg | ORAL_TABLET | Freq: Two times a day (BID) | ORAL | Status: DC
  Administered 2016-12-27 – 2017-01-05 (×20): 100 mg via ORAL
  Filled 2016-12-26 (×21): qty 1

## 2016-12-26 MED ORDER — CHLORHEXIDINE GLUCONATE 0.12% ORAL RINSE (MEDLINE KIT)
15.0000 mL | Freq: Two times a day (BID) | OROMUCOSAL | Status: DC
  Administered 2016-12-26 – 2016-12-31 (×9): 15 mL via OROMUCOSAL

## 2016-12-26 SURGICAL SUPPLY — 70 items
ADH SKN CLS APL DERMABOND .7 (GAUZE/BANDAGES/DRESSINGS)
BAG DECANTER FOR FLEXI CONT (MISCELLANEOUS) IMPLANT
BLADE SURG 11 STRL SS (BLADE) ×4 IMPLANT
CANISTER SUCTION 2500CC (MISCELLANEOUS) ×4 IMPLANT
CATH KIT ON Q 5IN SLV (PAIN MANAGEMENT) IMPLANT
CATH ROBINSON RED A/P 22FR (CATHETERS) IMPLANT
CATH THORACIC 28FR (CATHETERS) ×2 IMPLANT
CATH THORACIC 36FR (CATHETERS) IMPLANT
CATH THORACIC 36FR RT ANG (CATHETERS) ×2 IMPLANT
CONN 1/2X3/8X3/8 Y GISH (MISCELLANEOUS) ×2 IMPLANT
CONN ST 1/4X3/8  BEN (MISCELLANEOUS) ×2
CONN ST 1/4X3/8 BEN (MISCELLANEOUS) IMPLANT
CONT SPEC 4OZ CLIKSEAL STRL BL (MISCELLANEOUS) ×14 IMPLANT
DERMABOND ADVANCED (GAUZE/BANDAGES/DRESSINGS)
DERMABOND ADVANCED .7 DNX12 (GAUZE/BANDAGES/DRESSINGS) IMPLANT
DRAIN CHANNEL 28F RND 3/8 FF (WOUND CARE) ×2 IMPLANT
DRAIN CHANNEL 32F RND 10.7 FF (WOUND CARE) ×2 IMPLANT
DRAPE LAPAROSCOPIC ABDOMINAL (DRAPES) ×4 IMPLANT
DRAPE WARM FLUID 44X44 (DRAPE) ×4 IMPLANT
ELECT BLADE 4.0 EZ CLEAN MEGAD (MISCELLANEOUS) ×8
ELECT REM PT RETURN 9FT ADLT (ELECTROSURGICAL) ×4
ELECTRODE BLDE 4.0 EZ CLN MEGD (MISCELLANEOUS) ×2 IMPLANT
ELECTRODE REM PT RTRN 9FT ADLT (ELECTROSURGICAL) ×2 IMPLANT
GAUZE SPONGE 4X4 12PLY STRL (GAUZE/BANDAGES/DRESSINGS) ×4 IMPLANT
GLOVE BIO SURGEON STRL SZ7.5 (GLOVE) ×8 IMPLANT
GOWN STRL REUS W/ TWL LRG LVL3 (GOWN DISPOSABLE) ×6 IMPLANT
GOWN STRL REUS W/TWL LRG LVL3 (GOWN DISPOSABLE) ×12
KIT BASIN OR (CUSTOM PROCEDURE TRAY) ×4 IMPLANT
KIT ROOM TURNOVER OR (KITS) ×4 IMPLANT
KIT SUCTION CATH 14FR (SUCTIONS) ×4 IMPLANT
LIQUID BAND (GAUZE/BANDAGES/DRESSINGS) ×2 IMPLANT
NS IRRIG 1000ML POUR BTL (IV SOLUTION) ×16 IMPLANT
PACK CHEST (CUSTOM PROCEDURE TRAY) ×4 IMPLANT
PAD ARMBOARD 7.5X6 YLW CONV (MISCELLANEOUS) ×8 IMPLANT
SEALANT SURG COSEAL 4ML (VASCULAR PRODUCTS) IMPLANT
SEALANT SURG COSEAL 8ML (VASCULAR PRODUCTS) ×2 IMPLANT
SOLUTION ANTI FOG 6CC (MISCELLANEOUS) ×6 IMPLANT
SPONGE GAUZE 4X4 12PLY STER LF (GAUZE/BANDAGES/DRESSINGS) ×2 IMPLANT
SPONGE TONSIL 1.25 RF SGL STRG (GAUZE/BANDAGES/DRESSINGS) ×8 IMPLANT
SUT CHROMIC 3 0 SH 27 (SUTURE) IMPLANT
SUT ETHILON 3 0 PS 1 (SUTURE) IMPLANT
SUT PROLENE 3 0 SH DA (SUTURE) IMPLANT
SUT PROLENE 4 0 RB 1 (SUTURE)
SUT PROLENE 4-0 RB1 .5 CRCL 36 (SUTURE) IMPLANT
SUT PROLENE 6 0 C 1 30 (SUTURE) IMPLANT
SUT SILK  1 MH (SUTURE) ×6
SUT SILK 1 MH (SUTURE) ×4 IMPLANT
SUT SILK 1 TIES 10X30 (SUTURE) IMPLANT
SUT SILK 2 0SH CR/8 30 (SUTURE) IMPLANT
SUT SILK 3 0SH CR/8 30 (SUTURE) IMPLANT
SUT VIC AB 1 CTX 18 (SUTURE) ×8 IMPLANT
SUT VIC AB 2 TP1 27 (SUTURE) IMPLANT
SUT VIC AB 2-0 CT2 18 VCP726D (SUTURE) IMPLANT
SUT VIC AB 2-0 CTX 36 (SUTURE) ×6 IMPLANT
SUT VIC AB 3-0 SH 18 (SUTURE) IMPLANT
SUT VIC AB 3-0 SH 27 (SUTURE) ×4
SUT VIC AB 3-0 SH 27X BRD (SUTURE) IMPLANT
SUT VIC AB 3-0 X1 27 (SUTURE) ×6 IMPLANT
SUT VICRYL 0 UR6 27IN ABS (SUTURE) IMPLANT
SUT VICRYL 2 TP 1 (SUTURE) ×2 IMPLANT
SWAB COLLECTION DEVICE MRSA (MISCELLANEOUS) ×4 IMPLANT
SYSTEM SAHARA CHEST DRAIN ATS (WOUND CARE) ×4 IMPLANT
TAPE CLOTH SURG 4X10 WHT LF (GAUZE/BANDAGES/DRESSINGS) ×2 IMPLANT
TIP APPLICATOR SPRAY EXTEND 16 (VASCULAR PRODUCTS) IMPLANT
TOWEL OR 17X24 6PK STRL BLUE (TOWEL DISPOSABLE) ×4 IMPLANT
TOWEL OR 17X26 10 PK STRL BLUE (TOWEL DISPOSABLE) ×8 IMPLANT
TRAP SPECIMEN MUCOUS 40CC (MISCELLANEOUS) ×12 IMPLANT
TRAY FOLEY CATH 16FRSI W/METER (SET/KITS/TRAYS/PACK) ×4 IMPLANT
TUBE ANAEROBIC SPECIMEN COL (MISCELLANEOUS) IMPLANT
WATER STERILE IRR 1000ML POUR (IV SOLUTION) ×8 IMPLANT

## 2016-12-26 NOTE — Anesthesia Procedure Notes (Signed)
Procedure Name: Intubation Date/Time: 12/26/2016 1:57 PM Performed by: Manuela Schwartz B Pre-anesthesia Checklist: Patient identified, Emergency Drugs available, Suction available, Patient being monitored and Timeout performed Patient Re-evaluated:Patient Re-evaluated prior to inductionOxygen Delivery Method: Circle system utilized Preoxygenation: Pre-oxygenation with 100% oxygen Intubation Type: IV induction and Rapid sequence Laryngoscope Size: Mac and 3 Grade View: Grade I Endobronchial tube: Left, Double lumen EBT, EBT position confirmed by auscultation and EBT position confirmed by fiberoptic bronchoscope and 37 Fr Number of attempts: 1 Airway Equipment and Method: Stylet Placement Confirmation: ETT inserted through vocal cords under direct vision,  positive ETCO2 and breath sounds checked- equal and bilateral Tube secured with: Tape Dental Injury: Teeth and Oropharynx as per pre-operative assessment

## 2016-12-26 NOTE — Progress Notes (Signed)
Pt has refused neb treatments this morning and is refusing to wear the nasal cannula. On room air sat 89%.  RN notified.

## 2016-12-26 NOTE — Anesthesia Postprocedure Evaluation (Addendum)
Anesthesia Post Note  Patient: [name]  Procedure(s) Performed: Procedure(s) (LRB): LEFT VIDEO ASSISTED THORACOSCOPY (VATS)/DECORTICATION (Left) VIDEO BRONCHOSCOPY WITH BRONCHIAL LAVAGE (N/A)  Patient location during evaluation: ICU Anesthesia Type: General Level of consciousness: sedated Pain management: pain level controlled Vital Signs Assessment: post-procedure vital signs reviewed and stable Respiratory status: patient remains intubated per anesthesia plan Cardiovascular status: stable Anesthetic complications: no       Last Vitals:  Vitals:   12/26/16 1701 12/26/16 1715  BP: (!) 138/123 131/82  Pulse: 97 (!) 101  Resp: 16 16  Temp: 36.1 C     Last Pain:  Vitals:   12/26/16 1701  TempSrc:   PainSc: Asleep                 Jolissa Kapral

## 2016-12-26 NOTE — Consult Note (Signed)
[facility]       [place] [address]             [phone]        _10  year old Caucasian female smoker with history of COPD/asthma, bipolar disorder, chronic narcotic use, status post cholecystectomy admitted the hospital 2 weeks ago with left lung pneumonia/lingular abscess with altered mental status and acute lacunar stroke. She was in respiratory distress and required intubation. Her sputum cultures were positive for Streptococcus. She was started on appropriate antibiotics and was extubated. She developed C. difficile colitis with abdominal distention. Her pulmonary status then deteriorated with increasing opacification of the left lung field. Follow-up CT scan within the past 24 hours shows a significant left empyema in addition to the lingular abscess. The patient is tachycardic with a white count of 21,000. Antibiotics have been changed without clinical improvement. Echocardiogram showed normal LV systolic function without evidence of endocarditis or pericardial effusion. She had elevated LFTs on admission which have improved with C. difficile colitis she has been nothing by mouth and has had poor nutrition for the past several days. Her blood cultures have been negative.   Current Activity/ Functional Status: Patient was living with assistance from her daughter [name] prior to admission she has developed encephalopathy in the hospital   Zubrod Score: At the time of surgery this patient's most appropriate activity status/level should be described as: _1     0    Normal activity, no symptoms _2     1    Restricted in  physical strenuous activity but ambulatory, able to do out light work _3     2    Ambulatory and capable of self care, unable to do work activities, up and about                 more than 50%  Of the time                            _4     3    Only limited self care, in bed greater than 50% of waking hours _5     4    Completely disabled, no self care, confined to bed or chair _6     5    Moribund  Past Medical History:  Diagnosis Date  . Allergic rhinitis   . Arthritis   . Asthma   . Back pain   . Bipolar 1 disorder ([place])   . Chronic low back pain 03/06/2014  . COPD (chronic obstructive pulmonary disease) ([place])   . Depression   . Fibroid   . Hypertension   . IBS (irritable bowel syndrome)   . Migraine   . Scoliosis     Past Surgical History:  Procedure Laterality Date  . CHOLECYSTECTOMY    . DILATION AND CURETTAGE OF UTERUS    . EXPLORATORY LAPAROTOMY     WHEN PATIENT WAS YOUNG  . RIGHT FOOT SURGERY    . TUBAL LIGATION Bilateral     History  Smoking Status  . Current Every Day Smoker  .  Packs/day: 0.50  . Types: Cigarettes  Smokeless Tobacco  . Never Used    History  Alcohol Use No    Social History   Social History  . Marital status: Single    Spouse name: N/A  . Number of children: 2  . Years of education: college   Occupational History  . disabled Disabled   Social History Main Topics  . Smoking status: Current Every Day Smoker    Packs/day: 0.50    Types: Cigarettes  . Smokeless tobacco: Never Used  . Alcohol use No  . Drug use: No  . Sexual activity: No   Other Topics Concern  . Not on file   Social History Narrative  . No narrative on file    Allergies  Allergen Reactions  . Cymbalta [Duloxetine Hcl] Other (See Comments)    Mood changes...mean  . Mucinex [Guaifenesin Er] Other (See Comments)    Possible allergy, per patient (??)  . Nyquil Multi-Symptom [Pseudoeph-Doxylamine-Dm-Apap] Hives  . Paxil [Paroxetine Hcl] Other (See Comments)      Mean and aggresive  . Promethazine Itching  . Tramadol Itching  . Wellbutrin [Bupropion] Other (See Comments)    Rapid heartrate  . Zoloft [Sertraline] Other (See Comments)    Anger    Current Facility-Administered Medications  Medication Dose Route Frequency Provider Last Rate Last Dose  . albuterol (PROVENTIL) (2.5 MG/3ML) 0.083% nebulizer solution 2.5 mg  2.5 mg Nebulization Q2H PRN Cherene Altes, MD   2.5 mg at 12/26/16 1157  . Ampicillin-Sulbactam (UNASYN) 3 g in sodium chloride 0.9 % 100 mL IVPB  3 g Intravenous Q6H Praveen Mannam, MD   3 g at 12/26/16 1148  . aspirin tablet 325 mg  325 mg Per Tube Daily Milagros Loll, MD   325 mg at 12/26/16 [phone]  . budesonide (PULMICORT) nebulizer solution 0.5 mg  0.5 mg Nebulization BID Rahul P Desai, PA-C   0.5 mg at 12/25/16 2008  . chlorhexidine gluconate (MEDLINE KIT) (PERIDEX) 0.12 % solution 15 mL  15 mL Mouth Rinse BID Milagros Loll, MD   15 mL at 12/25/16 2231  . dextrose 5 % and 0.9 % NaCl with KCl 40 mEq/L infusion   Intravenous Continuous Cherene Altes, MD 60 mL/hr at 12/25/16 1100    . escitalopram (LEXAPRO) tablet 40 mg  40 mg Oral Daily Omar Person, NP   40 mg at 12/26/16 1000  . feeding supplement (PRO-STAT SUGAR FREE 64) liquid 30 mL  30 mL Oral BID Jani Gravel, MD   30 mL at 12/25/16 2229  . gabapentin (NEURONTIN) capsule 300 mg  300 mg Oral TID Omar Person, NP   300 mg at 12/26/16 0844  . haloperidol lactate (HALDOL) injection 2 mg  2 mg Intravenous Q6H PRN Cherene Altes, MD   2 mg at 12/26/16 0841  . heparin injection 5,000 Units  5,000 Units Subcutaneous Q8H Praveen Mannam, MD   5,000 Units at 12/26/16 0520  . hydrALAZINE (APRESOLINE) injection 10 mg  10 mg Intravenous Q4H PRN Lorella Nimrod, MD   10 mg at 12/26/16 0323  . insulin aspart (novoLOG) injection 0-9 Units  0-9 Units Subcutaneous Q4H Guy Begin, MD   2 Units at 12/26/16 [phone]  . ipratropium-albuterol (DUONEB) 0.5-2.5 (3) MG/3ML  nebulizer solution 3 mL  3 mL Nebulization Q6H Alexa R Burns, MD   3 mL at 12/26/16 0259  . lamoTRIgine (LAMICTAL) tablet 100 mg  100 mg Oral BID Louretta Parma  Bernell List, NP   100 mg at 12/26/16 0845  . lidocaine (XYLOCAINE) 2 % viscous mouth solution 15 mL  15 mL Mouth/Throat Q4H PRN Milagros Loll, MD   15 mL at 12/21/16 1124  . MEDLINE mouth rinse  15 mL Mouth Rinse QID Milagros Loll, MD   15 mL at 12/26/16 0323  . methylPREDNISolone sodium succinate (SOLU-MEDROL) 40 mg/mL injection 20 mg  20 mg Intravenous Q12H Kara Mead V, MD      . metoprolol (LOPRESSOR) injection 5 mg  5 mg Intravenous Q6H Alexa R Quay Burow, MD   5 mg at 12/26/16 1149  . metroNIDAZOLE (FLAGYL) IVPB 500 mg  500 mg Intravenous Q8H Velna Ochs, MD   500 mg at 12/26/16 0839  . potassium chloride 20 MEQ/15ML (10%) solution 40 mEq  40 mEq Oral Once Cherene Altes, MD      . QUEtiapine (SEROQUEL) tablet 100 mg  100 mg Oral QHS Omar Person, NP   100 mg at 12/25/16 2230  . RESOURCE THICKENUP CLEAR   Oral PRN Jani Gravel, MD      . vancomycin (VANCOCIN) 500 mg in sodium chloride irrigation 0.9 % 100 mL ENEMA  500 mg Rectal Q6H Alexa Angela Burke, MD   500 mg at 12/26/16 0839  . white petrolatum (VASELINE) gel   Topical PRN Brand Males, MD        Facility-Administered Medications Prior to Admission  Medication Dose Route Frequency Provider Last Rate Last Dose  . Mepolizumab SOLR 100 mg  100 mg Subcutaneous Q28 days Valentina Shaggy, MD   100 mg at 12/01/16 1610   Prescriptions Prior to Admission  Medication Sig Dispense Refill Last Dose  . acetaminophen (TYLENOL) 325 MG tablet Take 325-650 mg by mouth every 6 (six) hours as needed for headache (or pain).   Past Month at Unknown time  . albuterol (PROAIR HFA) 108 (90 Base) MCG/ACT inhaler Inhale 2 puffs into the lungs every 6 (six) hours as needed for wheezing or shortness of breath. (Patient taking differently: Inhale 2 puffs into the lungs 3 (three) times daily. )  8.5 Inhaler 5 See LF date at ?  . albuterol (PROVENTIL) (2.5 MG/3ML) 0.083% nebulizer solution Take 3 mLs (2.5 mg total) by nebulization every 6 (six) hours as needed for wheezing or shortness of breath. 75 mL 1 See LF date at ?  Marland Kitchen Azelastine HCl 0.15 % SOLN Place 1 spray into both nostrils 2 (two) times daily. 30 mL 5 See LF date at ?  . beclomethasone (QVAR) 40 MCG/ACT inhaler Inhale 4 puffs into the lungs daily at 12 noon. 1 Inhaler 3 See LF date at ?  . benzonatate (TESSALON PERLES) 100 MG capsule Take 1 capsule (100 mg total) by mouth 3 (three) times daily as needed for cough. 20 capsule 0 See LF date at ?  . calcium carbonate (TUMS - DOSED IN MG ELEMENTAL CALCIUM) 500 MG chewable tablet Chew 1 tablet (200 mg of elemental calcium total) by mouth 2 (two) times daily. (Patient not taking: Reported on 12/01/2016)   Not Taking at Unknown time  . clonazePAM (KLONOPIN) 1 MG tablet Take 1 mg by mouth daily as needed for anxiety.    See LF date at ?  . ergocalciferol (VITAMIN D2) 50000 units capsule Take 50,000 Units by mouth once a week.    See LF date at ?  . escitalopram (LEXAPRO) 20 MG tablet Take 40 mg by mouth daily.  See LF date at ?  . gabapentin (NEURONTIN) 400 MG capsule Take 800 mg by mouth 3 (three) times daily.    See LF date at ?  . hydrOXYzine (VISTARIL) 25 MG capsule Take 25 mg by mouth 3 (three) times daily as needed for anxiety.  2 See LF date at ?  . ipratropium (ATROVENT) 0.03 % nasal spray 1-2 sprays every 6 hours as needed (Patient taking differently: Place 1-2 sprays into both nostrils every 6 (six) hours as needed for rhinitis. ) 30 mL 5 See LF date at ?  . lamoTRIgine (LAMICTAL) 100 MG tablet Take 100 mg by mouth 2 (two) times daily.   See LF date at ?  . levocetirizine (XYZAL) 5 MG tablet Take 1 tablet (5 mg total) by mouth every evening. 30 tablet 5 See LF date at ?  . loratadine (CLARITIN) 10 MG tablet Take 1 tablet (10 mg total) by mouth every morning. 30 tablet 5 See LF date  at ?  . meloxicam (MOBIC) 15 MG tablet Take 15 mg by mouth daily.   See LF date at ?  . methocarbamol (ROBAXIN) 750 MG tablet Take 750 mg by mouth 3 (three) times daily.    See LF date at ?  . metoprolol tartrate (LOPRESSOR) 25 MG tablet Take 25 mg by mouth 2 (two) times daily. Reported on 02/21/2016   See LF date at ?  . mometasone-formoterol (DULERA) 200-5 MCG/ACT AERO Inhale 2 puffs into the lungs 2 (two) times daily. 1 Inhaler 4 See LF date at ?  . ondansetron (ZOFRAN) 4 MG tablet Take 4 mg by mouth every 8 (eight) hours as needed for nausea or vomiting.   See LF date at ?  Marland Kitchen oxyCODONE (ROXICODONE) 15 MG immediate release tablet Take 15 mg by mouth 3 (three) times daily.    See LF date at ?  . QUEtiapine (SEROQUEL) 300 MG tablet Take 300 mg by mouth at bedtime.   See LF date at ?  . ranitidine (ZANTAC) 300 MG tablet Take 300 mg by mouth 2 (two) times daily.  3 See LF date at ?  . rosuvastatin (CRESTOR) 10 MG tablet Take 10 mg by mouth daily. Reported on 05/14/2016   See LF date at ?  . Spacer/Aero-Holding Chambers (AEROCHAMBER PLUS FLO-VU MEDIUM) MISC Use with metered dose inhaler 1 each 1 with MDI at with MDI  . tiotropium (SPIRIVA HANDIHALER) 18 MCG inhalation capsule Place 1 capsule (18 mcg total) into inhaler and inhale daily. 30 capsule 3 See LF date at ?    Family History  Problem Relation Age of Onset  . Stroke Mother   . Hypertension Brother   . Hypertension Brother   . Hypertension Brother   . Allergic rhinitis Neg Hx   . Angioedema Neg Hx   . Asthma Neg Hx   . Atopy Neg Hx   . Eczema Neg Hx   . Immunodeficiency Neg Hx   . Urticaria Neg Hx      Review of Systems:       Cardiac Review of Systems: Y or N  Chest Pain [   Yes left side with ribs tender ]  Resting SOB [ yes  ] Exertional SOB  [ yes ]  Vertell Limber [place]  ]   Pedal Edema [  yes ]    Palpitations [place]  ] Syncope  [  no   Presyncope [ no  ]  General Review of Systems: [Y] = yes [  ]=  no Constitional: recent weight  change [ weight loss ]; anorexia [place]  ]; fatigue [ yes ]; nausea [  ]; night sweats [  ]; fever [  ]; or chills [  ]                                                               Dental: poor dentition[ suboptimal  ]; Last Dentist visit: Greater than 6 months  Eye : blurred vision [  ]; diplopia [   ]; vision changes [  ];  Amaurosis fugax[  ]; Resp: cough [ yes ];  wheezing[  ];  hemoptysis[  ]; shortness of breath[ yes ]; paroxysmal nocturnal dyspnea[  ]; dyspnea on exertion yes[  ]; or orthopnea[  ];  GI:  gallstones[ status post cholecystectomy ], vomiting[  ];  dysphagia[  ]; melena[  ];  hematochezia [  ]; heartburn[  ];   Hx of  Colonoscopy[  ]; GU: kidney stones [  ]; hematuria[  ];   dysuria [  ];  nocturia[  ];  history of     obstruction [  ]; urinary frequency [  ]             Skin: rash, swelling[  ];, hair loss[  ];  peripheral edema[  ];  or itching[  ]; Musculosketetal: myalgias[  ];  joint swelling[  ];  joint erythema[  ];  joint pain[  ];  back pain[  ];  Heme/Lymph: bruising[  ];  bleeding[  ];  anemia[  ];  Neuro: TIA[  ];  headaches[  ];  stroke[ yes the coronary infarcts in the left cerebellum  ];  vertigo[  ];  seizures[  ];   paresthesias[  ];  difficulty walking[  ];  Psych:depression[ yes  ]; anxiety[ yes  ]bipolar disorder on Seroquel Lexapro lamotrigine;  Endocrine: diabetes[  ];  thyroid dysfunction[  ];  Immunizations: Flu [  ]; Pneumococcal[  ];  Other: No prior chest surgery or injuries-trauma  Physical Exam: BP (!) 162/100   Pulse (!) 103   Temp 98.8 F (37.1 C) (Axillary)   Resp 17   Ht _0  (1.626 m)   Wt 169 lb 15.6 oz (77.1 kg)   SpO2 94%   BMI 29.18 kg/m       Physical Exam  General: Middle-aged Caucasian female  showing tachypnea in moderate respiratory distress, tachycardic but responsive and oriented HEENT: Normocephalic pupils equal , dentition adequate Neck: Supple without JVD, adenopathy, or bruit Chest diminished tubular breath sounds  on left side, positive bilateral rhonchi, positive left-sided rib tenderness             Without deformity deformity Cardiovascular: Regular rate and rhythm, tachycardic, no murmur, no gallop, peripheral pulses             palpable in all extremities Abdomen:  Soft, mildly distended but nontender, no palpable mass or organomegaly Extremities: Warm, well-perfused, no clubbing cyanosis , 2+ bilateral ankle edema without calf tenderness,              no venous stasis changes of the legs Rectal/GU: Deferred Neuro: Grossly non--focal and symmetrical throughout Skin: Clean and dry without rash or ulceration    Diagnostic Studies & Laboratory data:  Recent Radiology Findings:   Ct Chest Wo Contrast  Result Date: 12/25/2016 CLINICAL DATA:  LEFT lower lobe abscess.  Bacteria pneumonia. EXAM: CT CHEST WITHOUT CONTRAST TECHNIQUE: Multidetector CT imaging of the chest was performed following the standard protocol without IV contrast. COMPARISON:  Chest CT 12/22/2016 FINDINGS: Cardiovascular: No significant vascular findings. Normal heart size. No pericardial effusion. Mediastinum/Nodes: No axillary supraclavicular adenopathy no mediastinal hilar adenopathy. No pericardial effusion esophagus normal. Lungs/Pleura: Moderate-sized LEFT pneumothorax with gas collecting anteriorly in this supine patient (approximately 20% hemothorax volume). This fluid level at the inferior LEFT hemithorax consistent with a component of hydropneumothorax (image 110, series 4). Cavitary lesion / pulmonary abscess in the lingula is similar in size measuring 6.2 by 3.8 cm compared to 6.3 x 3.8 cm (image 67, series 4) There is new loculated fluid at the RIGHT lung base measuring 6.3 cm in depth with a small air-fluid level. Loculated fluid along the LEFT cardiac border. There is new atelectasis of the LEFT lung base. RIGHT lung is relatively clear.  There is centrilobular emphysema Upper Abdomen: Adrenal glands normal.  Musculoskeletal: Multiple bilateral healed rib fractures laterally on the LEFT and RIGHT. IMPRESSION: 1. Moderate volume LEFT hydropneumothorax slightly increased in volume from radiograph 12/24/2016. 2. No change in size of pulmonary abscess in the lingula. 3. Interval increase in loculated fluid at the LEFT lung base with scattered air pockets and lower lobe atelectasis. These results will be called to the ordering clinician or representative by the Radiologist Assistant, and communication documented in the PACS or zVision Dashboard. Electronically Signed   By: Suzy Bouchard M.D.   On: 12/25/2016 09:06   Dg Chest Port 1 View  Result Date: 12/25/2016 CLINICAL DATA:  Hypoxia and shortness of breath. EXAM: PORTABLE CHEST 1 VIEW COMPARISON:  Radiograph yesterday.  CT 12/22/2016 FINDINGS: Cavitary lesion in the left lung is again seen. Unchanged left lung base opacity may be combination of pleural fluid and airspace disease. Patient is rotated. Left pneumothorax on prior exam is not confidently identified. Minimal right lung base atelectasis. IMPRESSION: 1. Unchanged left basilar opacity likely combination of pleural fluid and airspace disease, atelectasis or pneumonia. Cavitary lesion in the periphery of the left lung is again seen. 2. Left pneumothorax on prior exam is not confidently identified. 3. Right basilar atelectasis. Electronically Signed   By: Jeb Levering M.D.   On: 12/25/2016 04:50   Dg Chest Port 1 View  Result Date: 12/24/2016 CLINICAL DATA:  Shortness of Breath, change in behavior EXAM: PORTABLE CHEST 1 VIEW COMPARISON:  12/23/2016 FINDINGS: Cardiomediastinal silhouette is unremarkable. Stable cavitary lesion in left midlung peripheral measures 4.2 cm. Small partially loculated left pleural effusion left basilar atelectasis or infiltrate. There is small left apical pneumothorax about 4-5%. No pulmonary edema. IMPRESSION: Stable cavitary lesion left midlung peripheral. Small left pleural  effusion left basilar atelectasis or infiltrate. Small left apical pneumothorax. Electronically Signed   By: Lahoma Crocker M.D.   On: 12/24/2016 12:35     I have independently reviewed the above radiologic studies.  Recent Lab Findings: Lab Results  Component Value Date   WBC 21.6 (H) 12/26/2016   HGB 9.0 (L) 12/26/2016   HCT 28.6 (L) 12/26/2016   PLT 648 (H) 12/26/2016   GLUCOSE 128 (H) 12/26/2016   CHOL 113 12/13/2016   TRIG 143 12/15/2016   HDL 36 (L) 12/13/2016   LDLCALC 52 12/13/2016   ALT 29 12/26/2016   AST 14 (L) 12/26/2016   NA 141 12/26/2016  K 4.0 12/26/2016   CL 112 (H) 12/26/2016   CREATININE 0.46 12/26/2016   BUN 10 12/26/2016   CO2 20 (L) 12/26/2016   TSH 0.892 04/17/2014   INR 1.21 12/14/2016   HGBA1C 5.4 12/13/2016      Assessment / Plan:      Streptococcal left upper lobe lung abscess with progression to left empyema and probably by lobar pneumonia with persistent leukocytosis tachycardia and deterioration in palmar status. I have recommended urgent left VATS, drainage of empyema and decortication of lung and possible resection of lung abscess with bronchoscopy. I discussed the indications and expected benefits of the surgery with the patient as well as with her daughter [name] who I contacted over the phone at her work place and discussed the critical condition of her mother and the rationale for surgery. The patient and her daughter [name] both understand this is a urgent operation with risk of persistent infection multisystem failure and death but without drainage of empyema \the infection would not be controlled or resolved. I expect the patient to require prolonged mechanical ventilation postop, prolonged antibiotic treatment and enteral or parenteral nutrition as soon as possible     _0 @ 12/26/2016 12:25 PM

## 2016-12-26 NOTE — Anesthesia Procedure Notes (Signed)
Date/Time: 12/26/2016 4:13 PM Performed by: Manuela Schwartz B Laryngoscope Size: Mac and 3 Grade View: Grade I Tube size: 8.0 mm Number of attempts: 1 Airway Equipment and Method: Stylet Placement Confirmation: ETT inserted through vocal cords under direct vision,  positive ETCO2 and breath sounds checked- equal and bilateral (DLT exchanged for 8.0 ETT without difficulty) Secured at: 22 cm Tube secured with: Tape Dental Injury: Teeth and Oropharynx as per pre-operative assessment

## 2016-12-26 NOTE — Transfer of Care (Signed)
Immediate Anesthesia Transfer of Care Note  Patient: [name]  Procedure(s) Performed: Procedure(s): LEFT VIDEO ASSISTED THORACOSCOPY (VATS)/DECORTICATION (Left) VIDEO BRONCHOSCOPY WITH BRONCHIAL LAVAGE (N/A)  Patient Location: PACU  Anesthesia Type:General  Level of Consciousness: Patient remains intubated per anesthesia plan  Airway & Oxygen Therapy: Patient remains intubated per anesthesia plan and Patient placed on Ventilator (see vital sign flow sheet for setting)  Post-op Assessment: Report given to RN  Post vital signs: Reviewed and stable  Last Vitals:  Vitals:   12/26/16 1157 12/26/16 1208  BP: (!) 162/100 (!) 162/100  Pulse: (!) 102 (!) 103  Resp: 20 17  Temp:      Last Pain:  Vitals:   12/26/16 1100  TempSrc: Axillary  PainSc:          Complications: No apparent anesthesia complications

## 2016-12-26 NOTE — Progress Notes (Signed)
Patient started off the morning very confused, only oriented to self. Pt when asked if she knew where she was stated, "[place]." Patient agitated and wanted to get dressed to leave. Patient stated, "I have a planned outing with my sister today." Patient was given Haldol per MD order for agitation. Patient did settle down and was not attempting to leave after medication. Patient continues to have episodes of confusion but reports she is willing to have surgery. Since patient is confused, obtained consent from daughter, Falon Kendal. [name] was tearful and stated she gives consent to surgery as long as her mother is okay with it. Telephone consent signed and witnessed by Vicenta Dunning, NS. Patient transported off unit and report given.

## 2016-12-26 NOTE — Brief Op Note (Signed)
12/12/2016 - 12/26/2016  3:58 PM  PATIENT:  [name]  51 y.o. female  PRE-OPERATIVE DIAGNOSIS:  empyema  POST-OPERATIVE DIAGNOSIS:  empyema  PROCEDURE:  Procedure(s): VIDEO ASSISTED THORACOSCOPY (VATS)/DECORTICATION (Left) VIDEO BRONCHOSCOPY WITH BRONCHIAL LAVAGE (N/A)  SURGEON:  Surgeon(s) and Role:    * Ivin Poot, MD - Primary  PHYSICIAN ASSISTANT:  Nicholes Rough, PA-C  ANESTHESIA:   general  EBL:  Total I/O In: 40 [Blood:670] Out: 750 [Urine:600; Blood:150]  BLOOD ADMINISTERED:2 UNITS CC PRBC  DRAINS: angeled chest tube, two blake drains   LOCAL MEDICATIONS USED:  NONE  SPECIMEN:  Source of Specimen:  biopsy of pleural peel  DISPOSITION OF SPECIMEN:  PATHOLOGY  COUNTS:  YES  TOURNIQUET:  * No tourniquets in log *  DICTATION: .Dragon Dictation  PLAN OF CARE: Admit to inpatient   PATIENT DISPOSITION:  ICU - extubated and stable.   Delay start of Pharmacological VTE agent (>24hrs) due to surgical blood loss or risk of bleeding: yes

## 2016-12-26 NOTE — Progress Notes (Signed)
SLP Cancellation Note  Patient Details Name: [name] MRN: [id number] DOB: 09/28/1966   Cancelled treatment:       Reason Eval/Treat Not Completed: Patient at procedure or test/unavailable. Plan for VATS today. After surgery, do not initiate diet without SLP reassessment as [name]    [name], Katherene Ponto 12/26/2016, 11:35 AM

## 2016-12-26 NOTE — Anesthesia Preprocedure Evaluation (Addendum)
Anesthesia Evaluation  Patient identified by MRN, date of birth, ID band Patient awake    Reviewed: Allergy & Precautions, NPO status , Unable to perform ROS - Chart review only  Airway Mallampati: I   Neck ROM: Full    Dental  (+) Edentulous Upper   Pulmonary shortness of breath, asthma , COPD, Current Smoker,    + rhonchi  + decreased breath sounds unstable     Cardiovascular hypertension, +CHF   Rhythm:Regular Rate:Tachycardia     Neuro/Psych CVA    GI/Hepatic   Endo/Other    Renal/GU Renal disease     Musculoskeletal  (+) Arthritis ,   Abdominal   Peds  Hematology   Anesthesia Other Findings   Reproductive/Obstetrics                            Anesthesia Physical Anesthesia Plan  ASA: IV  Anesthesia Plan: General   Post-op Pain Management:    Induction: Intravenous  Airway Management Planned: Double Lumen EBT  Additional Equipment: Arterial line and CVP  Intra-op Plan:   Post-operative Plan: Post-operative intubation/ventilation  Informed Consent: I have reviewed the patients History and Physical, chart, labs and discussed the procedure including the risks, benefits and alternatives for the proposed anesthesia with the patient or authorized representative who has indicated his/her understanding and acceptance.   Dental advisory given  Plan Discussed with: CRNA  Anesthesia Plan Comments:         Anesthesia Quick Evaluation

## 2016-12-26 NOTE — Progress Notes (Signed)
Pt on room air to obtain ABG per MD order.

## 2016-12-26 NOTE — Progress Notes (Signed)
Progress Note   Vitals:   12/26/16 1815 12/26/16 1840  BP:  (!) 175/84  Pulse:  (!) 116  Resp:  18  Temp: 97.7 F (36.5 C)    Patient post-VATS via Dr. Prescott Gum. Called for Vent Management.   Physical Exam  HENT:  Head: Normocephalic.  ETT in place  Eyes: Pupils are equal, round, and reactive to light.  Cardiovascular: Normal rate and normal heart sounds.  Exam reveals no gallop and no friction rub.   No murmur heard. Pulmonary/Chest: Breath sounds normal. No respiratory distress. She has no wheezes.  3 chest tubes in place on left side   Abdominal: She exhibits no distension. There is no tenderness.  Neurological:  Sedated, moves extremities   Skin: Skin is warm and dry.    Acute Hypoxic Respiratory Failure secondary to Streptococcal Left upper lobe abscess s/p VATS -Vent Support  -Wean as tolerated  -Follow up CXR in AM  -Continue Pulmicort Neb BID -Continue Albuterol PRN -Maintain Saturation >92   Acute Encephalopathy secondary to sedation  -RASS goal 0/-1 -Wean Propofol and Fentanyl gtt to achieve RASS -PRN versed   CC Time: 30 mins   Hayden Pedro, AG-ACNP [facility]  Pgr: [phone]  PCCM Pgr: [phone]

## 2016-12-26 NOTE — Progress Notes (Signed)
eLink Physician-Brief Progress Note Patient Name: [name] DOB: 1965/12/19 MRN: [id number]   Date of Service  12/26/2016  HPI/Events of Note  Notified of need for stress ulcer prophylaxis.   eICU Interventions  Will order Protonix IV.     Intervention Category Intermediate Interventions: Best-practice therapies (e.g. DVT, beta blocker, etc.)  Edelyn Heidel Eugene 12/26/2016, 8:19 PM

## 2016-12-26 NOTE — Anesthesia Procedure Notes (Addendum)
Central Venous Catheter Insertion Performed by: Rica Koyanagi, anesthesiologist Start/End2/12/2016 1:10 PM, 12/26/2016 1:36 PM Preanesthetic checklist: patient identified, IV checked, site marked, risks and benefits discussed, surgical consent, monitors and equipment checked, pre-op evaluation and timeout performed Position: Trendelenburg Lidocaine 1% used for infiltration and patient sedated Hand hygiene performed , maximum sterile barriers used  and Seldinger technique used Catheter size: 8 Fr Central line was placed.Double lumen Procedure performed using ultrasound guided technique. Ultrasound Notes:anatomy identified, needle tip was noted to be adjacent to the nerve/plexus identified, no ultrasound evidence of intravascular and/or intraneural injection and image(s) printed for medical record Attempts: 1 Following insertion, line sutured, dressing applied and Biopatch. Post procedure assessment: blood return through all ports  Patient tolerated the procedure well with no immediate complications.

## 2016-12-26 NOTE — Progress Notes (Signed)
Occupational Therapy Treatment Patient Details Name: [name] MRN: [id number] DOB: Oct 16, 1966 Today's Date: 12/26/2016    History of present illness 51 y/o F, 41 pk year smoker, with Hx of COPD/asthma and bipolar disorder admitted 1/19 with SOB.  Working diagnosis of acute asthma exacerbation, multilobar PNA with resultant hypoxic respiratory failure.  Intubated 12/12/16 to 12/20/16.  Extubated 1/28. Pt transferred to step down on 1/29 with ileus. NG placed which pt pulled out.   OT comments  Pt much more alert and participating. Performed UB bathing and dressing at EOB and washed face and hands in supine. Increased tolerance of sitting EOB and stood with walker x 2 and side stepped toward [facility] with encouragement. HR elevated to 137 with activity, agreed to using 02 during therapy session. Will continue to follow.  Follow Up Recommendations  SNF;Supervision/Assistance - 24 hour    Equipment Recommendations       Recommendations for Other Services      Precautions / Restrictions Precautions Precautions: Fall Restrictions Weight Bearing Restrictions: No       Mobility Bed Mobility Overal bed mobility: Needs Assistance Bed Mobility: Supine to Sit;Sit to Supine     Supine to sit: Min assist;+2 for physical assistance;+2 for safety/equipment Sit to supine: Min assist;+2 for physical assistance;+2 for safety/equipment   General bed mobility comments: pt required increased time, VC'ing for sequencing and min A with bilateral LEs and trunk to achieve sitting EOB. Min A x2 to return to supine.  Transfers Overall transfer level: Needs assistance Equipment used: Rolling walker (2 wheeled) Transfers: Sit to/from Stand Sit to Stand: Min assist;+2 physical assistance;+2 safety/equipment         General transfer comment: pt performed sit-to-stand from bed x2 with min A x2 for safety and stability    Balance Overall balance assessment: Needs assistance Sitting-balance support:  Feet supported Sitting balance-Leahy Scale: Fair Sitting balance - Comments: pt tolerated sitting EOB x10 minutes with min guard for safety   Standing balance support: During functional activity;Bilateral upper extremity supported Standing balance-Leahy Scale: Poor Standing balance comment: pt reliant on bilateral UEs on RW                   ADL Overall ADL's : Needs assistance/impaired Eating/Feeding: NPO   Grooming: Brushing hair;Sitting;Total assistance;Wash/dry hands;Wash/dry face;Minimal assistance Grooming Details (indicate cue type and reason): declined mouth swab/oral care Upper Body Bathing: Moderate assistance;Sitting Upper Body Bathing Details (indicate cue type and reason): poor task persistence, thoroughness, but participated      Upper Body Dressing : Maximal assistance;Sitting   Lower Body Dressing: Total assistance;Bed level               Functional mobility during ADLs: +2 for physical assistance;Minimal assistance (took steps to [facility] with RW)        Vision                     Perception     Praxis      Cognition   Behavior During Therapy: Flat affect;Impulsive Overall Cognitive Status: Impaired/Different from baseline Area of Impairment: Orientation;Safety/judgement;Awareness;Problem solving Orientation Level: Disoriented to;Time        Safety/Judgement: Decreased awareness of safety;Decreased awareness of deficits   Problem Solving: Slow processing General Comments: poor insight into function she will need to d/c home    Extremity/Trunk Assessment               Exercises     Shoulder Instructions  General Comments      Pertinent Vitals/ Pain       Pain Assessment: Faces Faces Pain Scale: No hurt  Home Living                                          Prior Functioning/Environment              Frequency  Min 2X/week        Progress Toward Goals  OT Goals(current goals can  now be found in the care plan section)  Progress towards OT goals: Progressing toward goals  Acute Rehab OT Goals Patient Stated Goal: to return home Time For Goal Achievement: 01/06/17 Potential to Achieve Goals: [place] Discharge plan remains appropriate    Co-evaluation    PT/OT/SLP Co-Evaluation/Treatment: Yes Reason for Co-Treatment: For patient/therapist safety;Complexity of the patient's impairments (multi-system involvement) PT goals addressed during session: Mobility/safety with mobility;Balance;Strengthening/ROM OT goals addressed during session: ADL's and self-care      End of Session Equipment Utilized During Treatment: Oxygen;Gait belt;Rolling walker   Activity Tolerance Patient limited by fatigue   Patient Left in bed;with call bell/phone within reach   Nurse Communication          Time: [id number] OT Time Calculation (min): 28 min  Charges: OT General Charges $OT Visit: 1 Procedure OT Treatments $Self Care/Home Management : 8-22 mins  Malka So 12/26/2016, 10:13 AM  [phone]

## 2016-12-26 NOTE — Progress Notes (Signed)
Physical Therapy Treatment Patient Details Name: Marcel MELLANIE ALTSCHULER MRN: [id number] DOB: 14-Aug-1966 Today's Date: 12/26/2016    History of Present Illness 51 y/o F, 35 pk year smoker, with Hx of COPD/asthma and bipolar disorder admitted 1/19 with SOB.  Working diagnosis of acute asthma exacerbation, multilobar PNA with resultant hypoxic respiratory failure.  Intubated 12/12/16 to 12/20/16.  Extubated 1/28. Pt transferred to step down on 1/29 with ileus. NG placed which pt pulled out.    PT Comments    Pt making good progress this session. She tolerated sitting EOB x10 minutes, sit-to-stand from bed x2 with min A x2 and took lateral side steps to [facility] with RW. Pt's HR was elevated throughout, rising as high as 137 bpm in standing. At end of session, HR in high 110's. Pt would continue to benefit from skilled physical therapy services at this time while admitted and after d/c to address her limitations in order to improve her overall safety and independence with functional mobility.    Follow Up Recommendations  SNF     Equipment Recommendations  None recommended by PT;Other (comment) (defer to next venue)    Recommendations for Other Services       Precautions / Restrictions Precautions Precautions: Fall Restrictions Weight Bearing Restrictions: No    Mobility  Bed Mobility Overal bed mobility: Needs Assistance Bed Mobility: Supine to Sit;Sit to Supine     Supine to sit: Min assist;+2 for physical assistance;+2 for safety/equipment Sit to supine: Min assist;+2 for physical assistance;+2 for safety/equipment   General bed mobility comments: pt required increased time, VC'ing for sequencing and min A with bilateral LEs and trunk to achieve sitting EOB. Min A x2 to return to supine.  Transfers Overall transfer level: Needs assistance Equipment used: Rolling walker (2 wheeled) Transfers: Sit to/from Stand Sit to Stand: Min assist;+2 physical assistance;+2 safety/equipment          General transfer comment: pt performed sit-to-stand from bed x2 with min A x2 for safety and stability  Ambulation/Gait Ambulation/Gait assistance: Min assist;+2 physical assistance;+2 safety/equipment Ambulation Distance (Feet): 2 Feet Assistive device: Rolling walker (2 wheeled) Gait Pattern/deviations: Decreased stride length;Decreased step length - right;Decreased step length - left Gait velocity: decreased Gait velocity interpretation: Below normal speed for age/gender General Gait Details: pt took lateral side steps to [facility] with RW and min A to assist with movement of RW   Stairs            Wheelchair Mobility    Modified Rankin (Stroke Patients Only)       Balance Overall balance assessment: Needs assistance Sitting-balance support: Feet supported Sitting balance-Leahy Scale: Fair Sitting balance - Comments: pt tolerated sitting EOB x10 minutes with min guard for safety   Standing balance support: During functional activity;Bilateral upper extremity supported Standing balance-Leahy Scale: Poor Standing balance comment: pt reliant on bilateral UEs on RW                    Cognition Arousal/Alertness: Awake/alert Behavior During Therapy: Flat affect;Impulsive Overall Cognitive Status: Impaired/Different from baseline Area of Impairment: Orientation;Safety/judgement;Awareness;Problem solving Orientation Level: Disoriented to;Time       Safety/Judgement: Decreased awareness of safety;Decreased awareness of deficits   Problem Solving: Slow processing      Exercises      General Comments        Pertinent Vitals/Pain Pain Assessment: Faces Faces Pain Scale: No hurt    Home Living  Prior Function            PT Goals (current goals can now be found in the care plan section) Acute Rehab PT Goals Patient Stated Goal: to return home PT Goal Formulation: Patient unable to participate in goal setting Time For Goal  Achievement: 01/04/17 Potential to Achieve Goals: Good Progress towards PT goals: Progressing toward goals    Frequency    Min 3X/week      PT Plan Current plan remains appropriate    Co-evaluation PT/OT/SLP Co-Evaluation/Treatment: Yes Reason for Co-Treatment: Complexity of the patient's impairments (multi-system involvement);Necessary to address cognition/behavior during functional activity;For patient/therapist safety;To address functional/ADL transfers PT goals addressed during session: Mobility/safety with mobility;Balance;Strengthening/ROM       End of Session Equipment Utilized During Treatment: Gait belt;Oxygen Activity Tolerance: Patient limited by fatigue Patient left: in bed;with call bell/phone within reach;with bed alarm set     Time: [id number] PT Time Calculation (min) (ACUTE ONLY): 28 min  Charges:  $Therapeutic Activity: 8-22 mins                    G CodesClearnce Sorrel Harveen Flesch 2017-01-04, 10:01 AM Sherie Don, PT, DPT [phone]

## 2016-12-26 NOTE — Progress Notes (Signed)
eLink Physician-Brief Progress Note Patient Name: Madisyn JAHNEE KUBIAK DOB: Oct 22, 1966 MRN: [id number]   Date of Service  12/26/2016  HPI/Events of Note  Patient now intubated and mechanically ventilated s/p L VATS and chest tube placement for L lung empyema. Sedation with a Propofol IV infusion is already ordered. Titrate to RASS = 0 to -1.  CXR already done post intubation >> ETT tip 3.4 cm above carina.   eICU Interventions  Will order: 1. Ventilator Orders: 50%/PRVC 16/TV 600/P 5. 2. ABG at 7 PM.    5   Intervention Category Major Interventions: Respiratory failure - evaluation and management  Lysle Dingwall 12/26/2016, 6:33 PM

## 2016-12-26 NOTE — Progress Notes (Signed)
Name: [name] MRN: [id number] DOB: Nov 09, 1966    ADMISSION DATE:  12/12/2016 CONSULTATION DATE: 1/19  REFERRING MD : Alvino Chapel, MD   CHIEF COMPLAINT: Asthma Exacerbation   BRIEF PATIENT DESCRIPTION: 51 yo female with PMH asthma, COPD, and bipolar disorder.  Presented on 1/19 hypoxic with working diagnosis of acute asthma exacerbation, multilobar Streptococcus pneumoniae PNA with evidence of abscess formation within the left upper lobe with a small left-sided pneumothorax as well as C.Diff. Intubated 1/19-1/27.   1/28 PCCM was called to evaluate patient for increasing oxygen requirement from room air to 5L [place], worsening tachypnea, and tachycardia. CT abdomen revealed a colonic ileus without evidence of bowel obstruction.  SIGNIFICANT EVENTS  Admit / Intubate 1/19 Extubated 1/27  STUDIES:  CXR 1/19>> Consolidations bilaterally CT head 1/19>> Left occipital lobe probable acute or early subacute infarction. No acute intracranial hemorrhage. 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 There is a small amount of fluid in this structure. Also a 1 cm nodular structure in the left lower lobe with minimal cavitation.  Abd xr 1/27>> bowel distended with gas CT Chest 2/1 > Moderate left hydropneumothorax, no change in size of pulmonary abscess in the lingula, interval increase in loculated fluid at the left lung base with scattered air pockets and lower lobe atelectasis  Cultures: Blood 1/19 >> Negative Respiratory 1/20 >> Strep Pneumo  RVP 1/20 >> Negative  Oral HSV 1/22 >> positive (completed acyclovir)  Blood 1/24 >> Negative  C diff 1/28 >> Antigen & PCR positive   Antibiotics: Azithromycin 1/19 - 1/21 Ceftriaxone 1/19 - 1/23 Acyclovir 1/22 - 1/27 Unasyn 1/24 >> Vanco  1/29 >>  SUBJECTIVE:  Refuses treatments   VITAL SIGNS: Temp:  [98 F (36.7 C)-99.5 F (37.5 C)] 98 F (36.7 C) (02/02 0739) Pulse Rate:  [108-132] 118 (02/02 0739) Resp:  [18-25] 24 (02/02 0400) BP: (131-170)/(75-101) 131/85 (02/02 0739) SpO2:  [89 %-98 %] 89 % (02/02 0739) Weight:  [169 lb 15.6 oz (77.1 kg)] 169 lb 15.6 oz (77.1 kg) (02/02 0327)  PHYSICAL EXAMINATION: General: Adult female, lying in bed , Refuses O2/treatments Neuro:Awake and alert,voices understanding of current events, follows commands, moves extremities   HEENT: normocephalic  Cardiovascular: Tachy, no MRG, S1/S2 Lungs: expiratory wheezes, diminished to bases, labored breathing   O2 sats 87% Abdomen: distended, hypoactive bowel sounds, tender  Musculoskeletal: no acute deformities  Skin: warm, dry, intact     Recent Labs Lab 12/24/16 0429 12/25/16 0332 12/26/16 0326  NA 139 141 141  K 3.2* 3.6 4.0  CL 109 110 112*  CO2 22 21* 20*  BUN 6 7 10   CREATININE 0.44 0.41* 0.46  GLUCOSE 122* 104* 128*    Recent Labs Lab 12/24/16 0429 12/25/16 0332 12/26/16 0326  HGB 10.2* 9.8* 9.0*  HCT 32.8* 31.0* 28.6*  WBC 24.1* 21.6* 21.6*  PLT 664* 602* 648*   Ct Chest Wo Contrast  Result Date: 12/25/2016 CLINICAL DATA:  LEFT lower lobe abscess.  Bacteria pneumonia. EXAM: CT CHEST WITHOUT CONTRAST TECHNIQUE: Multidetector CT imaging of the chest was performed following the standard protocol without IV contrast. COMPARISON:  Chest CT 12/22/2016 FINDINGS: Cardiovascular: No significant vascular findings. Normal heart size. No pericardial effusion. Mediastinum/Nodes: No axillary supraclavicular adenopathy no mediastinal hilar adenopathy.  No pericardial effusion esophagus normal. Lungs/Pleura: Moderate-sized LEFT pneumothorax with gas collecting anteriorly in this supine patient (approximately 20% hemothorax volume). This fluid level at the inferior LEFT hemithorax consistent with a component of hydropneumothorax  (image 110, series 4). Cavitary lesion / pulmonary abscess in the lingula is similar in size measuring 6.2 by 3.8 cm compared to 6.3 x 3.8 cm (image 67, series 4) There is new loculated fluid at the RIGHT lung base measuring 6.3 cm in depth with a small air-fluid level. Loculated fluid along the LEFT cardiac border. There is new atelectasis of the LEFT lung base. RIGHT lung is relatively clear.  There is centrilobular emphysema Upper Abdomen: Adrenal glands normal. Musculoskeletal: Multiple bilateral healed rib fractures laterally on the LEFT and RIGHT. IMPRESSION: 1. Moderate volume LEFT hydropneumothorax slightly increased in volume from radiograph 12/24/2016. 2. No change in size of pulmonary abscess in the lingula. 3. Interval increase in loculated fluid at the LEFT lung base with scattered air pockets and lower lobe atelectasis. These results will be called to the ordering clinician or representative by the Radiologist Assistant, and communication documented in the PACS or zVision Dashboard. Electronically Signed   By: Suzy Bouchard M.D.   On: 12/25/2016 09:06   Dg Chest Port 1 View  Result Date: 12/25/2016 CLINICAL DATA:  Hypoxia and shortness of breath. EXAM: PORTABLE CHEST 1 VIEW COMPARISON:  Radiograph yesterday.  CT 12/22/2016 FINDINGS: Cavitary lesion in the left lung is again seen. Unchanged left lung base opacity may be combination of pleural fluid and airspace disease. Patient is rotated. Left pneumothorax on prior exam is not confidently identified. Minimal right lung base atelectasis. IMPRESSION: 1. Unchanged left basilar opacity likely combination of pleural fluid and airspace disease, atelectasis or pneumonia. Cavitary lesion in the periphery of the left lung is again seen. 2. Left pneumothorax on prior exam is not confidently identified. 3. Right basilar atelectasis. Electronically Signed   By: Jeb Levering M.D.   On: 12/25/2016 04:50   Dg Chest Port 1 View  Result Date:  12/24/2016 CLINICAL DATA:  Shortness of Breath, change in behavior EXAM: PORTABLE CHEST 1 VIEW COMPARISON:  12/23/2016 FINDINGS: Cardiomediastinal silhouette is unremarkable. Stable cavitary lesion in left midlung peripheral measures 4.2 cm. Small partially loculated left pleural effusion left basilar atelectasis or infiltrate. There is small left apical pneumothorax about 4-5%. No pulmonary edema. IMPRESSION: Stable cavitary lesion left midlung peripheral. Small left pleural effusion left basilar atelectasis or infiltrate. Small left apical pneumothorax. Electronically Signed   By: Lahoma Crocker M.D.   On: 12/24/2016 12:35   Dg Abd Portable 1v  Result Date: 12/24/2016 CLINICAL DATA:  Abdominal pain, labored breathing EXAM: PORTABLE ABDOMEN - 1 VIEW COMPARISON:  12/23/2016 FINDINGS: Decreasing gaseous distention of large and small bowel. Gas within nondistended large small bowel currently. Oral contrast material noted within the right colon and scattered small bowel loops. IMPRESSION: Decreasing gaseous distention of large and small bowel. Electronically Signed   By: Rolm Baptise M.D.   On: 12/24/2016 11:20    ASSESSMENT / PLAN:  Acute hypoxic respiratory failure secondary to multilobar PNA -Today on 5L [place], CXR reveals mild pulmonary vascular congestion  Asthma/COPD Exacerbation  P Maintain Saturation >88(refuses O2) Wean Fi02 as tolerated (refuses O2) Continue Solu-Medrol 40 mg BID Continue Duonebs Q6H(refuses treatments) Pulmicort BID(refuses) Albuterol Q2H prn (refuses) F/U CXR   Streptococcus pneumoniae LUL abscess  P Trend Fever and WBC curve  Continue Unasyn (need for minimum of 3 weeks) Repeat CT scan  in 2-3 weeks   Moderate left hydropneumothorax P Monitor  May need eval from [facility] (Have called cvts 2/2 and ask them to eval)  Acute Encephalopathy - H/O Bipolar  P Restart Home Medications when able to take PO  Avoid all sedative medications   Remaining of plan per Primary  Team.   Suspect she will get sicker, require intubation, then go to surgery with daughter acting as POA. 2/2 spoke at length with her, she knows what she needs to do and knows it will happen eventually but is determined to refuse treatment. Hopefully as her psych medications kick she will be more amenable to treatment. Note steroids may be making her mental status worse.    Richardson Landry Kaio Kuhlman ACNP [facility] PCCM Pager [phone] till 3 pm If no answer page [phone] 12/26/2016, 8:26 AM

## 2016-12-26 NOTE — Progress Notes (Signed)
Pt placed back on nasal cannula 3 L, sat 94% after obtaining ABG.

## 2016-12-26 NOTE — Clinical Social Work Note (Signed)
Clinical Social Work Assessment  Patient Details  Name: [name] MRN: [id number] Date of Birth: 1966-07-17  Date of referral:  12/26/16               Reason for consult:  Facility Placement, Discharge Planning                Permission sought to share information with:  Facility Sport and exercise psychologist, Family Supports Permission granted to share information::  Yes, Verbal Permission Granted  Name::     Gwynn Hein  Agency::  SNF's  Relationship::  Daughter  Contact Information:  [phone]  Housing/Transportation Living arrangements for the past 2 months:  Single Family Home Source of Information:  Medical Team, Adult Children Patient Interpreter Needed:  None Criminal Activity/Legal Involvement Pertinent to Current Situation/Hospitalization:  No - Comment as needed Significant Relationships:  Adult Children, Friend Lives with:  Self Do you feel safe going back to the place where you live?  No Need for family participation in patient care:  Yes (Comment)  Care giving concerns:  PT recommending SNF once medically stable for discharge.   Social Worker assessment / plan:  Patient confused per Therapist, sports. CSW called patient's daughter because she was not in the room. CSW introduced role and explained that PT recommendations would be discussed. Patient's daughter agreeable to SNF. Discussed Medicaid barriers: Patient would have to stay for 30 days in order for Medicaid to pay, therapy is out of pocket, and Medicaid check goes directly to facility. CSW explained that facilities typically absorb the cost of therapy if it is not a major financial loss for them. Patient's daughter expressed understanding. No further concerns. CSW encouraged patient's daughter to contact CSW as needed. CSW will continue to follow patient and her daughter for support and facilitate discharge to SNF once medically stable.  Employment status:  Disabled (Comment on whether or not currently receiving  Disability) Insurance information:  Medicaid In [place] PT Recommendations:  [place] / Referral to community resources:  [place]  Patient/Family's Response to care:  Patient confused per Therapist, sports. Patient's daughter agreeable to SNF placement. Patient's daughter supportive and involved in patient's care. Patient's daughter appreciated social work intervention.  Patient/Family's Understanding of and Emotional Response to Diagnosis, Current Treatment, and Prognosis:  Patient confused per RN. Patient's daughter has a good understanding of the reason for patient's admission. Patient's daughter appear happy with hospital care.  Emotional Assessment Appearance:  Appears stated age Attitude/Demeanor/Rapport:  Unable to Assess Affect (typically observed):  Unable to Assess Orientation:  Oriented to Self, Oriented to Place, Oriented to  Time, Oriented to Situation Alcohol / Substance use:  Illicit Drugs Psych involvement (Current and /or in the community):  No (Comment)  Discharge Needs  Concerns to be addressed:  Care Coordination Readmission within the last 30 days:  No Current discharge risk:  Cognitively Impaired, Dependent with Mobility, Lives alone, Psychiatric Illness Barriers to Discharge:  Awaiting State Approval Tour manager), Inadequate or no insurance, Continued Medical Work up   Candie Chroman, LCSW 12/26/2016, 12:53 PM

## 2016-12-26 NOTE — Progress Notes (Signed)
[facility] TEAM 1 - Stepdown/ICU TEAM  [name]  [id number] DOB: Sep 20, 1966 DOA: 12/12/2016 PCP: [name], MD    Brief Narrative:  51 year old female with a history of COPD/asthma and bipolar disorder who was admitted to the hospital with shortness of breath. She was found to have pneumonia. She had to be intubated for worsening respiratory failure. She was admitted to the intensive care unit. Extubated and transferred to the floor. Noted to have loose stools w/ abdominal distention. During this hospitalization she was also found to have an acute stroke. Dysphagia has been an issue. Patient subsequently developed diarrhea and ileus. Concern for C. difficile. On the night of 1/29 she became short of breath thought to be due to abdominal distention. NG tube was placed. She stabilized.  Subjective: Events of last 48hrs noted.  F/U CT chest has confirmed epyema of the L lung.  TCTS to take pt for VATS today.  She has agreed to procedure.      Assessment & Plan:  Acute hypoxic respiratory failure secondary to Multilobar Strep pneumoniae pneumonia with cavitary lesion > empyema Cultures from tracheal aspirate were positive for Strep pneumo. PCCM has been following.  To OR today for VATS after CT has confirmed empyema.    C. Difficile colitis / ileus CT scan of the abdomen and pelvis showed colonic ileus - C. difficile toxigenic PCR +  Demand ischemia with history of grade 2 diastolic dysfunction Troponin was elevated up to 1.25. Subsequently started trending down. Patient is on a beta blocker. TTE w/ no wall motion abnormalities. May benefit from outpatient cardiology evaluation.  Filed Weights   12/21/16 0352 12/25/16 0407 12/26/16 0327  Weight: 73.9 kg (162 lb 14.7 oz) 76.7 kg (169 lb 1.5 oz) 77.1 kg (169 lb 15.6 oz)    AKI Resolved w/ IV fluid - creatinine was elevated up to 3.46  Recent Labs Lab 12/22/16 2324 12/23/16 0222 12/24/16 0429 12/25/16 0332 12/26/16 0326    CREATININE 0.55 0.57 0.44 0.41* 0.46    Acute asthma exacerbation Resolved - no wheezing presently   Hypokalemia Continue to replace with goal of 4.0  Ischemic hepatitis At admission AST was 2024 and ALT was 1366. Most likely due to shock liver. Hepatitis panel negative. LFTs have improved.    Dysphagia Speech therapy is following. She underwent modified barium swallow on 1/29 and was cleared for dysphagia 1 diet.  Will need f/u SLP eval once weaned off vent.    Acute Anemia Hgb 10.4 on admission. Baseline around 12.  Stable at this time.  Acute encephalopathy  Acute CVA Infarcts noted in the left cerebellum. Patient was seen by Neurology. Stroke thought to be secondary to acute illness. Aspirin recommended. Secondary risk reduction.   Bipolar d/o Continue seroquel, lexapro, and lamotrigine when able.    DVT prophylaxis: SQ heparin Code Status: FULL CODE Family Communication: no family present at time of exam  Disposition Plan: to OR for VATS then ICU   Consultants:  PCCM Gen Surgery   Procedures: none  Antimicrobials:  Ceftriaxone 1/19 >> 1/24 Azithromycin 1/19 >> 1/22 Acyclovir 1/22 > 1/28 Unasyn 1/24 > Flagyl 1/29 > Vanc 1/29 >  Objective: Blood pressure (!) 162/100, pulse (!) 102, temperature 98.8 F (37.1 C), temperature source Axillary, resp. rate 20, height 5' 4"  (1.626 m), weight 77.1 kg (169 lb 15.6 oz), SpO2 (!) 87 %.  Intake/Output Summary (Last 24 hours) at 12/26/16 1206 Last data filed at 12/26/16 1100  Gross per 24 hour  Intake             1720 ml  Output             1652 ml  Net               68 ml   Filed Weights   12/21/16 0352 12/25/16 0407 12/26/16 0327  Weight: 73.9 kg (162 lb 14.7 oz) 76.7 kg (169 lb 1.5 oz) 77.1 kg (169 lb 15.6 oz)    Examination: General: mild tachypnea during visit  Lungs: poor air movement B bases - no wheeze  Cardiovascular: regular rate and rhythm Abdomen: soft, bowel sounds hypoactive, no rebound, no  ascites, no appreciable mass Extremities: 2+ pedal edema   CBC:  Recent Labs Lab 12/21/16 0227  12/22/16 2324 12/23/16 0222 12/24/16 0429 12/25/16 0332 12/26/16 0326  WBC 22.1*  < > 24.3* 25.2* 24.1* 21.6* 21.6*  NEUTROABS 18.6*  --   --   --   --   --   --   HGB 10.9*  < > 11.0* 10.4* 10.2* 9.8* 9.0*  HCT 35.5*  < > 35.3* 33.7* 32.8* 31.0* 28.6*  MCV 93.7  < > 92.7 93.4 90.6 92.0 90.8  PLT 483*  < > 532* 551* 664* 602* 648*  < > = values in this interval not displayed. Basic Metabolic Panel:  Recent Labs Lab 12/21/16 0821  12/22/16 0501 12/22/16 2324 12/23/16 0222 12/24/16 0429 12/25/16 0332 12/26/16 0326  NA  --   < > 147* 143 145 139 141 141  K  --   < > 5.1 3.6 3.5 3.2* 3.6 4.0  CL  --   < > 113* 109 109 109 110 112*  CO2  --   < > 23 22 25 22  21* 20*  GLUCOSE  --   < > 100* 109* 108* 122* 104* 128*  BUN  --   < > 15 12 12 6 7 10   CREATININE  --   < > 0.60 0.55 0.57 0.44 0.41* 0.46  CALCIUM  --   < > 9.1 8.9 8.9 8.7* 8.5* 8.5*  MG 2.1  --  2.3  --  1.8  --  1.6*  --   PHOS 2.9  --  3.3  --   --   --  2.4*  --   < > = values in this interval not displayed. GFR: Estimated Creatinine Clearance: 83.7 mL/min (by C-G formula based on SCr of 0.46 mg/dL).  Liver Function Tests:  Recent Labs Lab 12/20/16 0314 12/22/16 2324 12/23/16 0222 12/25/16 0332 12/26/16 0326  AST 30 23 26 21  14*  ALT 123* 67* 63* 38 29  ALKPHOS 81 75 71 71 62  BILITOT 0.4 0.7 0.7 0.7 0.3  PROT 5.7* 5.6* 5.3* 5.6* 5.5*  ALBUMIN 2.6* 3.0* 2.7* 2.5* 2.2*    HbA1C: Hgb A1c MFr Bld  Date/Time Value Ref Range Status  12/13/2016 06:13 AM 5.4 4.8 - 5.6 % Final    Comment:    (NOTE)         Pre-diabetes: 5.7 - 6.4         Diabetes: >6.4         Glycemic control for adults with diabetes: <7.0   12/13/2015 01:00 AM 6.2 (H) 4.8 - 5.6 % Final    Comment:    (NOTE)         Pre-diabetes: 5.7 - 6.4         Diabetes: >6.4  Glycemic control for adults with diabetes: <7.0      CBG:  Recent Labs Lab 12/25/16 2111 12/26/16 0024 12/26/16 0518 12/26/16 0818 12/26/16 1123  GLUCAP 120* 140* 128* 157* 122*    Recent Results (from the past 240 hour(s))  Culture, blood (routine x 2)     Status: None   Collection Time: 12/17/16  4:58 PM  Result Value Ref Range Status   Specimen Description BLOOD RIGHT ARM  Final   Special Requests IN PEDIATRIC BOTTLE [place]  Final   Culture NO GROWTH 5 DAYS  Final   Report Status 12/22/2016 FINAL  Final  Culture, blood (routine x 2)     Status: None   Collection Time: 12/17/16  5:05 PM  Result Value Ref Range Status   Specimen Description BLOOD RIGHT HAND  Final   Special Requests IN PEDIATRIC BOTTLE 3CC  Final   Culture NO GROWTH 5 DAYS  Final   Report Status 12/22/2016 FINAL  Final  C difficile quick scan w PCR reflex     Status: Abnormal   Collection Time: 12/21/16  6:25 PM  Result Value Ref Range Status   C Diff antigen POSITIVE (A) NEGATIVE Final   C Diff toxin NEGATIVE NEGATIVE Final   C Diff interpretation Results are indeterminate. See PCR results.  Final  Clostridium Difficile by PCR     Status: Abnormal   Collection Time: 12/21/16  6:25 PM  Result Value Ref Range Status   Toxigenic C Difficile by pcr POSITIVE (A) NEGATIVE Final    Comment: Positive for toxigenic C. difficile with little to no toxin production. Only treat if clinical presentation suggests symptomatic illness.     Scheduled Meds: . ampicillin-sulbactam (UNASYN) IV  3 g Intravenous Q6H  . aspirin  325 mg Per Tube Daily  . budesonide (PULMICORT) nebulizer solution  0.5 mg Nebulization BID  . chlorhexidine gluconate (MEDLINE KIT)  15 mL Mouth Rinse BID  . escitalopram  40 mg Oral Daily  . feeding supplement (PRO-STAT SUGAR FREE 64)  30 mL Oral BID  . gabapentin  300 mg Oral TID  . heparin subcutaneous  5,000 Units Subcutaneous Q8H  . insulin aspart  0-9 Units Subcutaneous Q4H  . ipratropium-albuterol  3 mL Nebulization Q6H  . lamoTRIgine   100 mg Oral BID  . mouth rinse  15 mL Mouth Rinse QID  . methylPREDNISolone (SOLU-MEDROL) injection  20 mg Intravenous Q12H  . metoprolol  5 mg Intravenous Q6H  . metronidazole  500 mg Intravenous Q8H  . potassium chloride  40 mEq Oral Once  . QUEtiapine  100 mg Oral QHS  . vancomycin (VANCOCIN) rectal ENEMA  500 mg Rectal Q6H    LOS: 14 days   Cherene Altes, MD [facility] Office  [phone] Pager - Text Page per Amion as per below:  On-Call/Text Page:      [email or web address]      password [email or web address]  If 7PM-7AM, please contact night-coverage [email or web address] Password [email or web address] 12/26/2016, 12:06 PM

## 2016-12-26 NOTE — Progress Notes (Signed)
Subjective: A lot more talkative today, sitting on edge of bed with techs; denies n/v/abd pain  Objective: Vital signs in last 24 hours: Temp:  [98 F (36.7 C)-99.5 F (37.5 C)] 98 F (36.7 C) (02/02 0739) Pulse Rate:  [108-132] 118 (02/02 0739) Resp:  [18-24] 24 (02/02 0400) BP: (131-170)/(75-101) 131/85 (02/02 0739) SpO2:  [89 %-98 %] 89 % (02/02 0739) Weight:  [77.1 kg (169 lb 15.6 oz)] 77.1 kg (169 lb 15.6 oz) (02/02 0327) Last BM Date: 12/25/16  Intake/Output from previous day: 02/01 0701 - 02/02 0700 In: 2080 [P.O.:60; I.V.:1320; IV Piggyback:600] Out: 1152 [Urine:450; Stool:702] Intake/Output this shift: Total I/O In: -  Out: 400 [Urine:400]  Alert, more talkative Still tachypnea Soft, not really distended, nt   Lab Results:   Recent Labs  12/25/16 0332 12/26/16 0326  WBC 21.6* 21.6*  HGB 9.8* 9.0*  HCT 31.0* 28.6*  PLT 602* 648*   BMET  Recent Labs  12/25/16 0332 12/26/16 0326  NA 141 141  K 3.6 4.0  CL 110 112*  CO2 21* 20*  GLUCOSE 104* 128*  BUN 7 10  CREATININE 0.41* 0.46  CALCIUM 8.5* 8.5*   PT/INR No results for input(s): LABPROT, INR in the last 72 hours. ABG  Recent Labs  12/24/16 1206 12/25/16 0445  PHART 7.472* 7.486*  HCO3 23.0 22.2    Studies/Results: Ct Chest Wo Contrast  Result Date: 12/25/2016 CLINICAL DATA:  LEFT lower lobe abscess.  Bacteria pneumonia. EXAM: CT CHEST WITHOUT CONTRAST TECHNIQUE: Multidetector CT imaging of the chest was performed following the standard protocol without IV contrast. COMPARISON:  Chest CT 12/22/2016 FINDINGS: Cardiovascular: No significant vascular findings. Normal heart size. No pericardial effusion. Mediastinum/Nodes: No axillary supraclavicular adenopathy no mediastinal hilar adenopathy. No pericardial effusion esophagus normal. Lungs/Pleura: Moderate-sized LEFT pneumothorax with gas collecting anteriorly in this supine patient (approximately 20% hemothorax volume). This fluid level at  the inferior LEFT hemithorax consistent with a component of hydropneumothorax (image 110, series 4). Cavitary lesion / pulmonary abscess in the lingula is similar in size measuring 6.2 by 3.8 cm compared to 6.3 x 3.8 cm (image 67, series 4) There is new loculated fluid at the RIGHT lung base measuring 6.3 cm in depth with a small air-fluid level. Loculated fluid along the LEFT cardiac border. There is new atelectasis of the LEFT lung base. RIGHT lung is relatively clear.  There is centrilobular emphysema Upper Abdomen: Adrenal glands normal. Musculoskeletal: Multiple bilateral healed rib fractures laterally on the LEFT and RIGHT. IMPRESSION: 1. Moderate volume LEFT hydropneumothorax slightly increased in volume from radiograph 12/24/2016. 2. No change in size of pulmonary abscess in the lingula. 3. Interval increase in loculated fluid at the LEFT lung base with scattered air pockets and lower lobe atelectasis. These results will be called to the ordering clinician or representative by the Radiologist Assistant, and communication documented in the PACS or zVision Dashboard. Electronically Signed   By: Suzy Bouchard M.D.   On: 12/25/2016 09:06   Dg Chest Port 1 View  Result Date: 12/25/2016 CLINICAL DATA:  Hypoxia and shortness of breath. EXAM: PORTABLE CHEST 1 VIEW COMPARISON:  Radiograph yesterday.  CT 12/22/2016 FINDINGS: Cavitary lesion in the left lung is again seen. Unchanged left lung base opacity may be combination of pleural fluid and airspace disease. Patient is rotated. Left pneumothorax on prior exam is not confidently identified. Minimal right lung base atelectasis. IMPRESSION: 1. Unchanged left basilar opacity likely combination of pleural fluid and airspace disease, atelectasis or pneumonia.  Cavitary lesion in the periphery of the left lung is again seen. 2. Left pneumothorax on prior exam is not confidently identified. 3. Right basilar atelectasis. Electronically Signed   By: Jeb Levering  M.D.   On: 12/25/2016 04:50   Dg Chest Port 1 View  Result Date: 12/24/2016 CLINICAL DATA:  Shortness of Breath, change in behavior EXAM: PORTABLE CHEST 1 VIEW COMPARISON:  12/23/2016 FINDINGS: Cardiomediastinal silhouette is unremarkable. Stable cavitary lesion in left midlung peripheral measures 4.2 cm. Small partially loculated left pleural effusion left basilar atelectasis or infiltrate. There is small left apical pneumothorax about 4-5%. No pulmonary edema. IMPRESSION: Stable cavitary lesion left midlung peripheral. Small left pleural effusion left basilar atelectasis or infiltrate. Small left apical pneumothorax. Electronically Signed   By: Lahoma Crocker M.D.   On: 12/24/2016 12:35   Dg Abd Portable 1v  Result Date: 12/24/2016 CLINICAL DATA:  Abdominal pain, labored breathing EXAM: PORTABLE ABDOMEN - 1 VIEW COMPARISON:  12/23/2016 FINDINGS: Decreasing gaseous distention of large and small bowel. Gas within nondistended large small bowel currently. Oral contrast material noted within the right colon and scattered small bowel loops. IMPRESSION: Decreasing gaseous distention of large and small bowel. Electronically Signed   By: Rolm Baptise M.D.   On: 12/24/2016 11:20    Anti-infectives: Anti-infectives    Start     Dose/Rate Route Frequency Ordered Stop   12/23/16 0130  vancomycin (VANCOCIN) 500 mg in sodium chloride irrigation 0.9 % 100 mL ENEMA     500 mg Rectal Every 6 hours 12/23/16 0049     12/22/16 2354  metroNIDAZOLE (FLAGYL) IVPB 500 mg     500 mg 100 mL/hr over 60 Minutes Intravenous Every 8 hours 12/22/16 2354     12/22/16 2200  vancomycin (VANCOCIN) 50 mg/mL oral solution 125 mg  Status:  Discontinued     125 mg Oral 4 times daily 12/22/16 2058 12/23/16 0049   12/22/16 1000  vancomycin (VANCOCIN) 50 mg/mL oral solution 125 mg  Status:  Discontinued     125 mg Per Tube 4 times daily 12/22/16 0947 12/22/16 2058   12/17/16 1200  Ampicillin-Sulbactam (UNASYN) 3 g in sodium chloride 0.9  % 100 mL IVPB     3 g 200 mL/hr over 30 Minutes Intravenous Every 6 hours 12/17/16 1144     12/15/16 1015  acyclovir (ZOVIRAX) 200 MG/5ML suspension SUSP 400 mg     400 mg Per Tube 3 times daily 12/15/16 1000 12/22/16 0959   12/13/16 2000  azithromycin (ZITHROMAX) 500 mg in dextrose 5 % 250 mL IVPB  Status:  Discontinued     500 mg 250 mL/hr over 60 Minutes Intravenous Every 24 hours 12/12/16 2241 12/15/16 0957   12/13/16 2000  cefTRIAXone (ROCEPHIN) 1 g in dextrose 5 % 50 mL IVPB  Status:  Discontinued     1 g 100 mL/hr over 30 Minutes Intravenous Every 24 hours 12/12/16 2241 12/17/16 1144   12/12/16 2015  cefTRIAXone (ROCEPHIN) 1 g in dextrose 5 % 50 mL IVPB     1 g 100 mL/hr over 30 Minutes Intravenous  Once 12/12/16 2013 12/12/16 2110   12/12/16 2015  azithromycin (ZITHROMAX) 500 mg in dextrose 5 % 250 mL IVPB     500 mg 250 mL/hr over 60 Minutes Intravenous  Once 12/12/16 2013 12/12/16 2225      Assessment/Plan: Acute hypoxic respiratory failure secondary to Multilobar PNAwith small pneumothorax Multilobar pneumonia with strep pneumonia with cavitary lesion - strep pneumo. Demand ischemia  with history of grade 2 diastolic dysfunction AKI - Resolved Acute asthma exacerbation Ischemic hepatitis - At admission AST was 2024 and ALT was 1366. Most likely due to shock liver. Hepatitis panel negative. LFTs have improved. Dysphagia - modified barium swallow on 1/29, cleared for dysphagia 1 diet.  Leukocytosis Acute Anemia Mouth sores - acyclovir. Acute encephalopathy Acute CVA Bipolar d/o  Colonic ileus with c diff Having BMs. abd exam benign. Bowel gas pattern was improving the other day Ileus appears improving/resolved.  c diff txt per primary team  Nutritional status!! Pt to have formal repeat speech swallowing exam today If fails - highly rec consider placing cortrak tube to start enteral nutrition, would rec starting slowly and gradually advancing If passes swallow -  still may need tube since not sure how much pt can eat on own  Signing off since appears ileus is resolved Just need to address nutritional status pls call with questions  Leighton Ruff. Redmond Pulling, MD, FACS General, Bariatric, & Minimally Invasive Surgery [facility], [place]   LOS: 14 days    Gayland Curry 12/26/2016

## 2016-12-26 NOTE — Progress Notes (Signed)
Called Dr Prescott Gum with blood gas results. Orders to increase rate to 16 on vent and tidal volume to 600.

## 2016-12-27 ENCOUNTER — Inpatient Hospital Stay (HOSPITAL_COMMUNITY): Payer: Medicaid Other

## 2016-12-27 ENCOUNTER — Encounter (HOSPITAL_COMMUNITY): Payer: Self-pay | Admitting: Cardiothoracic Surgery

## 2016-12-27 DIAGNOSIS — R109 Unspecified abdominal pain: Secondary | ICD-10-CM

## 2016-12-27 LAB — POCT I-STAT 3, ART BLOOD GAS (G3+)
ACID-BASE DEFICIT: 2 mmol/L (ref 0.0–2.0)
Acid-base deficit: 6 mmol/L — ABNORMAL HIGH (ref 0.0–2.0)
BICARBONATE: 20.4 mmol/L (ref 20.0–28.0)
Bicarbonate: 24.1 mmol/L (ref 20.0–28.0)
O2 SAT: 97 %
O2 Saturation: 97 %
PCO2 ART: 40.3 mmHg (ref 32.0–48.0)
PO2 ART: 97 mmHg (ref 83.0–108.0)
TCO2: 22 mmol/L (ref 0–100)
TCO2: 26 mmol/L (ref 0–100)
pCO2 arterial: 47.7 mmHg (ref 32.0–48.0)
pH, Arterial: 7.311 — ABNORMAL LOW (ref 7.350–7.450)
pH, Arterial: 7.312 — ABNORMAL LOW (ref 7.350–7.450)
pO2, Arterial: 99 mmHg (ref 83.0–108.0)

## 2016-12-27 LAB — BASIC METABOLIC PANEL
Anion gap: 4 — ABNORMAL LOW (ref 5–15)
BUN: 9 mg/dL (ref 6–20)
CO2: 22 mmol/L (ref 22–32)
Calcium: 8.1 mg/dL — ABNORMAL LOW (ref 8.9–10.3)
Chloride: 115 mmol/L — ABNORMAL HIGH (ref 101–111)
Creatinine, Ser: 0.47 mg/dL (ref 0.44–1.00)
GFR calc Af Amer: >60 mL/min (ref >60)
GFR calc non Af Amer: >60 mL/min (ref >60)
GLUCOSE: 120 mg/dL — AB (ref 65–99)
POTASSIUM: 3.4 mmol/L — AB (ref 3.5–5.1)
Sodium: 141 mmol/L (ref 135–145)

## 2016-12-27 LAB — POCT I-STAT 4, (NA,K, GLUC, HGB,HCT)
GLUCOSE: 125 mg/dL — AB (ref 65–99)
HCT: 21 % — ABNORMAL LOW (ref 36.0–46.0)
Hemoglobin: 7.1 g/dL — ABNORMAL LOW (ref 12.0–15.0)
Potassium: 3.8 mmol/L (ref 3.5–5.1)
Sodium: 144 mmol/L (ref 135–145)

## 2016-12-27 LAB — POCT I-STAT, CHEM 8
BUN: 10 mg/dL (ref 6–20)
CALCIUM ION: 1.21 mmol/L (ref 1.15–1.40)
Chloride: 109 mmol/L (ref 101–111)
Creatinine, Ser: 0.4 mg/dL — ABNORMAL LOW (ref 0.44–1.00)
GLUCOSE: 116 mg/dL — AB (ref 65–99)
HCT: 32 % — ABNORMAL LOW (ref 36.0–46.0)
HEMOGLOBIN: 10.9 g/dL — AB (ref 12.0–15.0)
Potassium: 4.4 mmol/L (ref 3.5–5.1)
SODIUM: 142 mmol/L (ref 135–145)
TCO2: 25 mmol/L (ref 0–100)

## 2016-12-27 LAB — CBC
HEMATOCRIT: 31.4 % — AB (ref 36.0–46.0)
HEMOGLOBIN: 10.2 g/dL — AB (ref 12.0–15.0)
MCH: 29.2 pg (ref 26.0–34.0)
MCHC: 32.5 g/dL (ref 30.0–36.0)
MCV: 90 fL (ref 78.0–100.0)
Platelets: 440 10*3/uL — ABNORMAL HIGH (ref 150–400)
RBC: 3.49 MIL/uL — AB (ref 3.87–5.11)
RDW: 17.3 % — ABNORMAL HIGH (ref 11.5–15.5)
WBC: 13 10*3/uL — ABNORMAL HIGH (ref 4.0–10.5)

## 2016-12-27 LAB — TYPE AND SCREEN
Blood Product Expiration Date: 201802182359
Blood Product Expiration Date: 201802212359
ISSUE DATE / TIME: 201802021353
ISSUE DATE / TIME: 201802021353
Unit Type and Rh: 600
Unit Type and Rh: 600

## 2016-12-27 LAB — GLUCOSE, CAPILLARY
GLUCOSE-CAPILLARY: 109 mg/dL — AB (ref 65–99)
GLUCOSE-CAPILLARY: 140 mg/dL — AB (ref 65–99)
GLUCOSE-CAPILLARY: 111 mg/dL — AB (ref 65–99)
GLUCOSE-CAPILLARY: 100 mg/dL — AB (ref 65–99)
Glucose-Capillary: 109 mg/dL — ABNORMAL HIGH (ref 65–99)
Glucose-Capillary: 110 mg/dL — ABNORMAL HIGH (ref 65–99)
Glucose-Capillary: 120 mg/dL — ABNORMAL HIGH (ref 65–99)

## 2016-12-27 LAB — PHOSPHORUS: Phosphorus: 2.3 mg/dL — ABNORMAL LOW (ref 2.5–4.6)

## 2016-12-27 LAB — MAGNESIUM: MAGNESIUM: 1.6 mg/dL — AB (ref 1.7–2.4)

## 2016-12-27 MED ORDER — ANIDULAFUNGIN 100 MG IV SOLR
200.0000 mg | Freq: Once | INTRAVENOUS | Status: AC
  Administered 2016-12-27: 200 mg via INTRAVENOUS
  Filled 2016-12-27 (×2): qty 200

## 2016-12-27 MED ORDER — DEXTROSE 5 % IV SOLN
10.0000 mmol | Freq: Once | INTRAVENOUS | Status: AC
  Administered 2016-12-27: 10 mmol via INTRAVENOUS
  Filled 2016-12-27: qty 3.33

## 2016-12-27 MED ORDER — DEXTROSE-NACL 5-0.45 % IV SOLN
INTRAVENOUS | Status: DC
  Administered 2016-12-31 – 2017-01-05 (×2): via INTRAVENOUS

## 2016-12-27 MED ORDER — MAGNESIUM SULFATE 2 GM/50ML IV SOLN
2.0000 g | Freq: Once | INTRAVENOUS | Status: AC
  Administered 2016-12-27: 2 g via INTRAVENOUS
  Filled 2016-12-27: qty 50

## 2016-12-27 MED ORDER — SODIUM CHLORIDE 0.9% FLUSH: 10.0000 mL | INTRAVENOUS | Status: DC | PRN

## 2016-12-27 MED ORDER — DEXTROSE-NACL 5-0.45 % IV SOLN
INTRAVENOUS | Status: DC
  Administered 2016-12-27: 30 mL via INTRAVENOUS

## 2016-12-27 MED ORDER — SODIUM CHLORIDE 0.9% FLUSH
10.0000 mL | Freq: Two times a day (BID) | INTRAVENOUS | Status: DC
  Administered 2016-12-27 – 2016-12-31 (×8): 10 mL
  Administered 2017-01-01: 20 mL
  Administered 2017-01-01: 10 mL

## 2016-12-27 MED ORDER — IPRATROPIUM BROMIDE 0.02 % IN SOLN
0.5000 mg | Freq: Four times a day (QID) | RESPIRATORY_TRACT | Status: DC
  Administered 2016-12-27 – 2016-12-31 (×10): 0.5 mg via RESPIRATORY_TRACT
  Filled 2016-12-27 (×13): qty 2.5

## 2016-12-27 MED ORDER — SODIUM CHLORIDE 0.9 % IV SOLN
30.0000 meq | Freq: Once | INTRAVENOUS | Status: AC
  Administered 2016-12-27: 30 meq via INTRAVENOUS
  Filled 2016-12-27: qty 15

## 2016-12-27 MED ORDER — MICAFUNGIN SODIUM 50 MG IV SOLR: 100.0000 mg | INTRAVENOUS | Status: DC

## 2016-12-27 MED ORDER — CHLORHEXIDINE GLUCONATE CLOTH 2 % EX PADS
6.0000 | MEDICATED_PAD | Freq: Every day | CUTANEOUS | Status: DC
  Administered 2016-12-30 – 2017-01-01 (×2): 6 via TOPICAL

## 2016-12-27 MED ORDER — DEXTROSE-NACL 5-0.45 % IV SOLN: INTRAVENOUS | Status: AC

## 2016-12-27 MED ORDER — TRAVASOL 10 % IV SOLN: INTRAVENOUS | Status: DC

## 2016-12-27 MED ORDER — SODIUM BICARBONATE 8.4 % IV SOLN
50.0000 meq | Freq: Once | INTRAVENOUS | Status: AC
  Administered 2016-12-27: 50 meq via INTRAVENOUS
  Filled 2016-12-27: qty 50

## 2016-12-27 MED ORDER — TRACE MINERALS CR-CU-MN-SE-ZN 10-1000-500-60 MCG/ML IV SOLN
INTRAVENOUS | Status: AC
  Administered 2016-12-27: 18:00:00 via INTRAVENOUS
  Filled 2016-12-27: qty 960

## 2016-12-27 MED ORDER — SODIUM CHLORIDE 0.9 % IV SOLN
100.0000 mg | INTRAVENOUS | Status: DC
  Administered 2016-12-28 – 2016-12-30 (×3): 100 mg via INTRAVENOUS
  Filled 2016-12-27 (×4): qty 100

## 2016-12-27 NOTE — Op Note (Signed)
NAMEDAISEE, FOULDS NO.:  [id number]  MEDICAL RECORD NO.:  [id number]  LOCATION:  2M11C                        FACILITY:  [facility]  PHYSICIAN:  Ivin Poot, M.D.  DATE OF BIRTH:  1966-05-04  DATE OF PROCEDURE:  12/26/2016 DATE OF DISCHARGE:                              OPERATIVE REPORT   OPERATION: 1. Left VATS (video-assisted thoracoscopic surgery) with mini     thoracotomy, drainage of empyema. 2. Decortication of left lower lobe. 3. Drainage and debridement of left lower lobe lung abscess. 4. Video bronchoscopy to remove secretions from left lung bronchus.  SURGEON:  Ivin Poot, M.D.  ASSISTANT:  Nicholes Rough, PA-C.  ANESTHESIA:  General by Dr. Orene Desanctis.  PREOPERATIVE DIAGNOSIS:  Left lower lobe abscess with left lower lobe pneumonia and empyema and increasing respiratory distress.  CLINICAL NOTE:  The patient is an acutely ill 51 year old Caucasian female smoker, who was admitted to the hospital with left lower lobe pneumonia and lung abscess and required intubation on her presentation for respiratory distress.  She was treated with antibiotics.  She was extubated.  She developed C. difficile colitis.  She developed persistent evidence of sepsis with elevated white count, tachycardia, and a worsening chest x-ray.  CAT scan of the chest was repeated within the past 36 hours, which showed evidence of empyema and atelectasis of the left lower lobe.  Because her respiratory status was deteriorating, thoracic surgical evaluation was requested for the ATS for drainage of empyema.  I discussed the procedure of the ATS for drainage of empyema with the patient as well as with her daughter [name].  I discussed the indications and expected benefits of surgery.  I reviewed the fact that with her worsening pulmonary status, antibiotics without surgery would probably not resolve her infection and only lead to further clinical deterioration.  I discussed the  potential risks including the risks of bleeding, recurrent lung infection, multisystem failure, and death.  She demonstrated her understanding as did her daughter and agreed to proceed with surgery under what I felt was an informed consent.  OPERATIVE FINDINGS: 1. Severe loculated empyema of the left pleural space with collapse of     the left lung and a ruptured left lower lobe abscess into the left     pleural space.  There were moderate thick secretions in the left     lung bronchial airway on bronchoscopy.  Material from the abscess     and pleural space and bronchial airway were all sent for culture as     well as cytology and pathology from the pleural fluid and pleural     peel.  OPERATIVE PROCEDURE:  The patient was brought directly to the operating room from preop holding at where she had been assessed by the Anesthesia Team and informed consent was documented.  General anesthesia was induced under invasive hemodynamic monitoring with a double-lumen tube. The patient was turned left side up and the left chest was prepped and draped as a sterile field.  A proper time-out was performed.  A small incision was made in the fifth interspace anterior to the tip of the left scapula.  The pleural space was  entered.  The camera was inserted, but the pleural space was obliterated with adhesions.  The fluid was aspirated approximately 500 mL and this was sent for both cytology and culture.  The incision was extended approximately 6 cm and a small rib spreading retractor was placed.  The left lower lobe was entrapped in a peel and there were multiple loculated pockets of fluid around the left lower lobe and in the fissure.  These pockets of fluid were gradually opened and completely drained and the left lower lobe and upper lobe were completely mobilized with careful dissection.  The abscess in the lower lobe was identified and had ruptured.  The abscess cavity was debrided of necrotic  tissue, irrigated and drained at a later point by separate soft chest drain.  After the lower lobe was completely mobilized and all pleural peel was removed and loculated pockets of fluid were opened and drained.  Chest tubes were placed to drain the anterior pleural space.  The posterior pleural space as well as the abscess cavity in the left lower lobe, which was approximately 4 cm in diameter.  After the chest tubes were placed, the lung was re-expanded under direct vision and there was good filling of the left pleural space.  The ribs were reapproximated with #1 Vicryl.  The muscle layers were closed with interrupted #1 Vicryl in the subcutaneous and skin layers were closed in running Vicryl.  The chest tubes were connected to a Pleur-evac drainage canister and sterile dressings were applied.  The patient was then rolled supine.  The double-lumen tube was then exchanged for a single-lumen tube.  A video bronchoscope was then passed.  The distal trachea had thick clear secretions.  The left mainstem bronchus and the left lower lobe segments had thick clear secretions, which were irrigated and cleared.  The right lung had scant secretions.  There were no endobronchial evidence of tumor was noted in either lung.  The bronchoscope was then removed.  The patient was then returned to the recovery room, still on mechanical ventilation.  Estimated blood loss was 100 mL.     Ivin Poot, M.D.     PV/MEDQ  D:  12/26/2016  T:  12/27/2016  Job:  [id number]

## 2016-12-27 NOTE — Progress Notes (Signed)
Name: [name] MRN: [id number] DOB: [birth year]    ADMISSION DATE:  12/12/2016 CONSULTATION DATE: 1/19  REFERRING MD : Alvino Chapel, MD   CHIEF COMPLAINT: Asthma Exacerbation   BRIEF PATIENT DESCRIPTION: 51 yo female with PMH asthma, COPD, and bipolar disorder.  Presented on 1/19 hypoxic with working diagnosis of acute asthma exacerbation, multilobar Streptococcus pneumoniae PNA with evidence of abscess formation within the left upper lobe with a small left-sided pneumothorax as well as C.Diff. Intubated 1/19-1/27.   1/28 PCCM was called to evaluate patient for increasing oxygen requirement from room air to 5L [place], worsening tachypnea, and tachycardia. CT abdomen revealed a colonic ileus without evidence of bowel obstruction.  SIGNIFICANT EVENTS  2/2 : Pt had decortication L / VATS   STUDIES:  CXR 1/19>> Consolidations bilaterally CT head 1/19>> Left occipital lobe probable acute or early subacute infarction. No acute intracranial hemorrhage. 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 There is a small amount of fluid in this structure. Also a 1 cm nodular structure in the left lower lobe with minimal cavitation.  Abd xr 1/27>> bowel distended with gas CT Chest 2/1 > Moderate left hydropneumothorax, no change in size of pulmonary abscess in the lingula, interval increase in loculated fluid at the left lung base with scattered air pockets and lower lobe atelectasis  Cultures: Blood 1/19 >> Negative Respiratory 1/20 >> Strep Pneumo  RVP 1/20 >> Negative  Oral HSV 1/22 >> positive (completed acyclovir)  Blood 1/24 >> Negative  C diff 1/28 >> Antigen & PCR positive   Antibiotics: Azithromycin 1/19 - 1/21 Ceftriaxone 1/19 - 1/23 Acyclovir 1/22 - 1/27 Unasyn 1/24 >> Vanco  1/29 >> Flagyl >  SUBJECTIVE:  Pt had VATS L / decortication on 2/2.   Tolerated procedure well. Noted to be hypercapneic post op so kept intubated. Overnight, no issues. BP stable.  Taking meds per NGT.    VITAL SIGNS: Temp:  [97 F (36.1 C)-98.6 F (37 C)] 98.6 F (37 C) (02/03 1200) Pulse Rate:  [60-118] 62 (02/03 1300) Resp:  [11-20] 20 (02/03 1300) BP: (83-175)/(51-123) 112/66 (02/03 1300) SpO2:  [96 %-100 %] 97 % (02/03 1300) Arterial Line BP: (87-187)/(51-104) 87/53 (02/03 1300) FiO2 (%):  [40 %-50 %] 40 % (02/03 1123)  PHYSICAL EXAMINATION: General: Adult female, lying in bed. Sedated. NAD Neuro: Lethargic, follows commands, moves extremities   HEENT: normocephalic  Cardiovascular: Tachy, no MRG, S1/S2 Lungs: good ae. Crackles at bases.  Abdomen: distended, hypoactive bowel sounds, tender  Musculoskeletal: no acute deformities  Skin: warm, dry, intact     Recent Labs Lab 12/26/16 0326 12/26/16 1212 12/26/16 1459 12/27/16 0416  NA 141 144 144 141  K 4.0 3.9 3.8 3.4*  CL 112* 114*  --  115*  CO2 20* 20*  --  22  BUN 10 11  --  9  CREATININE 0.46 0.48  --  0.47  GLUCOSE 128* 135* 125* 120*    Recent Labs Lab 12/26/16 0326 12/26/16 1212 12/26/16 1459 12/27/16 0416  HGB 9.0* 9.3* 7.1* 10.2*  HCT 28.6* 29.1* 21.0* 31.4*  WBC 21.6* 19.6*  --  13.0*  PLT 648* 657*  --  440*   Dg Chest Port 1 View  Result Date: 12/27/2016 CLINICAL DATA:  51 year old  female status post VATS and decortication for left empyema. Initial encounter. EXAM: PORTABLE CHEST 1 VIEW COMPARISON:  12/26/2016 and earlier. FINDINGS: Portable AP semi upright view at 0559 hours. Endotracheal tube tip is at the clavicles. Enteric tube has been placed and courses to the abdomen, tip not included. Stable right IJ central line. Three left chest tubes remain in place. No pneumothorax identified. Mild regression of patchy residual left lung base opacity. Stable cardiac size and mediastinal  contours. Resolved pulmonary vascular congestion. The right lung is clear allowing for portable technique. IMPRESSION: 1. Enteric tube placed and courses to the abdomen, tip not included. 2.  Otherwise stable lines and tubes, including 3 left chest tubes. 3. No pneumothorax. Resolved pulmonary vascular congestion. Mildly regressed residual patchy opacity at the left lung base. Electronically Signed   By: Genevie Ann M.D.   On: 12/27/2016 07:38   Dg Chest Port 1 View  Result Date: 12/26/2016 CLINICAL DATA:  Empyema of pleural space EXAM: PORTABLE CHEST 1 VIEW COMPARISON:  12/25/2016 radiographic and CT exams FINDINGS: Tip of endotracheal tube projects 3.4 cm above carina. RIGHT jugular central venous catheter with tip projecting over SVC. LEFT thoracostomy tubes present. Upper normal size of cardiac silhouette. Mediastinal contours are vascularity normal. Decrease in LEFT pleural effusion since prior CT exam. No LEFT pneumothorax definitely visualized. Scattered mild infiltrates within RIGHT lung unchanged from prior CT. Bones unremarkable. IMPRESSION: Decrease in LEFT pleural effusion and resolution of LEFT pneumothorax post thoracostomy tube placement. Scattered pulmonary infiltrates. Electronically Signed   By: Lavonia Dana M.D.   On: 12/26/2016 17:24   Dg Abd Portable 1v  Result Date: 12/26/2016 CLINICAL DATA:  Nasogastric tube placement.  Initial encounter. EXAM: PORTABLE ABDOMEN - 1 VIEW COMPARISON:  Abdominal radiograph performed 12/24/2016 FINDINGS: The patient's enteric tube is seen ending overlying the antrum of the stomach. There has been interval placement of left-sided chest tubes. Clips are noted within the right upper quadrant, reflecting prior cholecystectomy. The visualized bowel gas pattern is grossly unremarkable. No free intra-abdominal air is seen, though evaluation for free air is limited on a single supine view. No acute osseous abnormalities are identified. IMPRESSION: Enteric tube noted  ending overlying the antrum of the stomach. Electronically Signed   By: Garald Balding M.D.   On: 12/26/2016 23:55    ASSESSMENT / PLAN:  Acute hypoxic respiratory failure secondary to multilobar Streptococcus pneumonia PNA + Empyema + hydrothorax S/P VATS/Decortication on 2/2 Asthma/COPD Exacerbation  Plan: On the vent. Plan for PST.  Extubate if she passes PST. Anticipate we can extubate in 1-2 days Maintain Saturation >88 Pt has been on Unasyn since admit.  Was switched to zosyn and eraxis post op on 2/2. Cont for now.  Continue Solu-Medrol 20 mg BID Continue Duonebs Q6H, pulmicort BID CT to suction. WBC better  Acute Encephalopathy 2/2 sepsis + agitation + anxiety Bipolar disorder Plan: Cont fentanyl  > wean off Versed PRN Cont Bipolar meds (lexapro, lamictal, seroquel, neurontin)   Ileus  Conservative mangement per gen surgery Keep NPO except meds Trial of TF or diet depending on whether she is extubated soon or not.    Cdiff colitis cont Vanc PR and IV flagyl  Prophylaxis Heparin SQ once OK with surgery PPI for SUP    I spent  30  minutes of Critical Care time with this patient today.    Family : No family at bedside.    Monica Becton, MD 12/27/2016, 2:25 PM [facility] Pager [phone] After 3 pm or if no answer, call [phone]

## 2016-12-27 NOTE — Progress Notes (Signed)
CT surgery p.m. Rounds  Patient awake on ventilator Attempt to wean unsuccessful with sats dropping less than 80% Chest tube output decreasing and remains thin serosanguineous Continue current antibiotics and antifungal agent TPN to start for severe protein deficiency malnutrition with C. difficile colitis being followed by Gen. surgery

## 2016-12-27 NOTE — Progress Notes (Signed)
Nutrition Follow-up  DOCUMENTATION CODES:   Not applicable  INTERVENTION:  TPN per Pharmacy ==> See new estimated nutrition needs below  RD to continue to monitor.   NUTRITION DIAGNOSIS:   Inadequate oral intake related to inability to eat as evidenced by NPO status; ongoing  GOAL:   Patient will meet greater than or equal to 90% of their needs; not met  MONITOR:   Vent status, Labs, Weight trends, Skin, I & O's, Other (Comment) (TPN tolerance)  REASON FOR ASSESSMENT:   Consult Enteral/tube feeding initiation and management  ASSESSMENT:   51 year old with severe persistent asthma, COPD, hypertension, bipolar. Admitted with acute hypoxic respiratory failure, multilobar Streptococcus pneumonia.  Pt prior extubated 1/27.  Pt with severe C. Diff colitis. She was started on TFs on 1/22 but stopped on 1/26 and speech recommended dysphagia diet 1. On 1/30 rapid response was called due to tachypnea and abdominal pain. CT showed ileus without obstruction.   PROCEDURE (2/2): VIDEO ASSISTED THORACOSCOPY (VATS)/DECORTICATION (Left) VIDEO BRONCHOSCOPY WITH BRONCHIAL LAVAGE (N/A)  Patient is currently intubated on ventilator support MV: 11.8 L/min Temp (24hrs), Avg:97.8 F (36.6 C), Min:97 F (36.1 C), Max:98.6 F (37 C)  Propofol: 9.2 ml/hr which provides 243 kcal/day.  RD consulted for new TPN as pt with ileus and inability to tolerate enteral nutrition. See new estimated nutrition needs below as pt is now intubated.  Labs and medications reviewed. Potassium low at 3.4. Phosphorous low at 2.3. Magnesium low at 1.6.  Continue to monitor magnesium, potassium, and phosphorus daily for at least 3 days, MD to replete as needed, as pt is at risk for refeeding syndrome.  Diet Order:  Diet NPO time specified .TPN (CLINIMIX-E) Adult  Skin:  Wound (see comment) (Unstg to lip, incision on chest)  Last BM:  2/3 Rectal tube in place- 700 ml  Height:   Ht Readings from Last 1  Encounters:  12/26/16 5' 4" (1.626 m)    Weight:   Wt Readings from Last 1 Encounters:  12/26/16 169 lb 15.6 oz (77.1 kg)    Ideal Body Weight:  54.54 kg  BMI:  Body mass index is 29.18 kg/m.  Estimated Nutritional Needs:   Kcal:  8099  Protein:  100-115 grams  Fluid:  >/= 1.7 L/day  EDUCATION NEEDS:   No education needs identified at this time  Corrin Parker, MS, RD, LDN Pager # [phone] After hours/ weekend pager # [phone]

## 2016-12-27 NOTE — Progress Notes (Signed)
PHARMACY - ADULT TOTAL PARENTERAL NUTRITION CONSULT NOTE   Pharmacy Consult for TPN Indication: Ileus / inability to tolerate enteral nutrition  Patient Measurements: Height: 5\' 4"  (162.6 cm) Weight: 169 lb 15.6 oz (77.1 kg) IBW/kg (Calculated) : 54.7 TPN AdjBW (KG): 60.1 Body mass index is 29.18 kg/m.  Assessment:  51 yo F presents on 1/19 with an asthma exacerbation. PMH includes asthma, bipolar disorder, COPD, depression, IBS. She was intubated on admission and extubated on 1/27. Found to have ischemic hepatitis with elevated LFTs. She was started on TFs on 1/22 but stopped on 1/26 and speech recommended dysphagia diet 1. On 1/30 rapid response was called due to tachypnea and abdominal pain. CT showed ileus without obstruction. Has been NPO for 8 days now. Had VATs procedure on 2/2 for empyema.   GI: NPO. Has severe C. Diff colitis. TPN started due to ileus and prolonged NPO status. Pt refused NG tube to suction. Albumin low at 2.2 and prealbumin low at 6. Last BM was 2/2.  Endo: CBGs controlled 100-150s on SSI. Also on methylpred Insulin requirements in the past 24 hours: 2 units of SSI  Lytes: wnl exc K low at 3.4. Phos low at 2.3 and Mg low at 1.6. CoCa 9.4 Renal: SCr stable, CrCl ~23ml/min. UOP ok at 0.59ml/kg/hr. D51/2NS at 150ml/hr. Pulm: Extubated on 1/27 but reintubated on 2/2. Cards: BP and HR ok Hepatobil: LFTS and Tbili wnl. TG ok at 156. Neuro: On fentanyl gtt, escitalopram, lamictal ID: On Zosyn for PNA / abscess. Started anidulafungin on 2/3 due to yeast found in lung cx. Also on vancomycin enemas for C. Diff. Older TA shows strep pneumo, de-escalate Zosyn? Afebrile, WBC down to 13.  Best Practices: SCDs, PPI TPN Access: CVC double lumen 2/2 >> TPN start date: 2/3 >>  Nutritional Goals (per RD recommendation on 1/29): KCal: [id number] Protein: 90-100 g  Current Nutrition:  NPO  Plan:  Start Clinimix E 5/15 at 46ml/hr Hold 20% lipid emulsion for first 7 days for  ICU patients per ASPEN guidelines (Start date 2/10) On propofol at 2mcg/kg/min (~370 kcal per day) Decrease D5 1/2NS to 63ml/hr tonight TPN and propofol provides 48 g of protein and 1,052 kCals per day meeting 50% of protein and kCal needs Add MVI in TPN Add TE (every other day) in TPN Continue resistant SSI and adjust as needed Monitor TPN labs, Cmet, Phos, and Mg tomorrow, s/s of refeeding syndrome F/U improvement in ileus and clinical picture  Given potassium 10mEq IV x 1 this am per MD Give Mg 2g IV x 1 Give potassium phosphate 70mmol IV x 1  Elenor Quinones, PharmD, [facility] Clinical Pharmacist Pager [phone] 12/27/2016 10:54 AM

## 2016-12-27 NOTE — Progress Notes (Addendum)
1 Day Post-Op Procedure(s) (LRB): LEFT VIDEO ASSISTED THORACOSCOPY (VATS)/DECORTICATION (Left) VIDEO BRONCHOSCOPY WITH BRONCHIAL LAVAGE (N/A) Subjective: Empyema from ruptured LLL lung abscess Gram + cocci, rods and yeast in pleural material C diff colitis CXR improved serosang CT drainage Objective: Vital signs in last 24 hours: Temp:  [97 F (36.1 C)-98.8 F (37.1 C)] 97.8 F (36.6 C) (02/03 0743) Pulse Rate:  [62-118] 71 (02/03 0743) Cardiac Rhythm: Normal sinus rhythm (02/03 0400) Resp:  [11-20] 20 (02/03 0743) BP: (83-175)/(51-123) 83/51 (02/03 0743) SpO2:  [87 %-100 %] 100 % (02/03 0743) Arterial Line BP: (113-187)/(51-104) 124/61 (02/03 0600) FiO2 (%):  [40 %-50 %] 40 % (02/03 0743)  Hemodynamic parameters for last 24 hours:  nsr  Intake/Output from previous day: 02/02 0701 - 02/03 0700 In: 3949.2 [I.V.:2574.2; Blood:670; IV Piggyback:465] Out: 1980 [Urine:880; Stool:700; Blood:150; Chest Tube:250] Intake/Output this shift: No intake/output data recorded.  No air leak Edematous   Lab Results:  Recent Labs  12/26/16 1212 12/27/16 0416  WBC 19.6* 13.0*  HGB 9.3* 10.2*  HCT 29.1* 31.4*  PLT 657* 440*   BMET:  Recent Labs  12/26/16 1212 12/27/16 0416  NA 144 141  K 3.9 3.4*  CL 114* 115*  CO2 20* 22  GLUCOSE 135* 120*  BUN 11 9  CREATININE 0.48 0.47  CALCIUM 8.6* 8.1*    PT/INR:  Recent Labs  12/26/16 1212  LABPROT 17.4*  INR 1.41   ABG    Component Value Date/Time   PHART 7.311 (L) 12/27/2016 0429   HCO3 20.4 12/27/2016 0429   TCO2 22 12/27/2016 0429   ACIDBASEDEF 6.0 (H) 12/27/2016 0429   O2SAT 97.0 12/27/2016 0429   CBG (last 3)   Recent Labs  12/27/16 0020 12/27/16 0503 12/27/16 0759  GLUCAP 109* 110* 109*    Assessment/Plan: S/P Procedure(s) (LRB): LEFT VIDEO ASSISTED THORACOSCOPY (VATS)/DECORTICATION (Left) VIDEO BRONCHOSCOPY WITH BRONCHIAL LAVAGE (N/A) vent per CCM, leave chest tubes Start TPN  LOS: 15 days     Tharon Aquas Trigt III 12/27/2016

## 2016-12-28 ENCOUNTER — Inpatient Hospital Stay (HOSPITAL_COMMUNITY): Payer: Medicaid Other

## 2016-12-28 LAB — DIFFERENTIAL
Basophils Absolute: 0 10*3/uL (ref 0.0–0.1)
Basophils Relative: 0 %
Eosinophils Absolute: 0 10*3/uL (ref 0.0–0.7)
Eosinophils Relative: 0 %
Lymphocytes Relative: 12 %
Lymphs Abs: 1.1 10*3/uL (ref 0.7–4.0)
Monocytes Absolute: 1 10*3/uL (ref 0.1–1.0)
Monocytes Relative: 10 %
Neutro Abs: 7.3 10*3/uL (ref 1.7–7.7)
Neutrophils Relative %: 78 %

## 2016-12-28 LAB — CBC
HCT: 29.9 % — ABNORMAL LOW (ref 36.0–46.0)
Hemoglobin: 9.5 g/dL — ABNORMAL LOW (ref 12.0–15.0)
MCH: 28.9 pg (ref 26.0–34.0)
MCHC: 31.8 g/dL (ref 30.0–36.0)
MCV: 90.9 fL (ref 78.0–100.0)
Platelets: 409 10*3/uL — ABNORMAL HIGH (ref 150–400)
RBC: 3.29 MIL/uL — ABNORMAL LOW (ref 3.87–5.11)
RDW: 17.1 % — ABNORMAL HIGH (ref 11.5–15.5)
WBC: 9.4 10*3/uL (ref 4.0–10.5)

## 2016-12-28 LAB — GLUCOSE, CAPILLARY
GLUCOSE-CAPILLARY: 114 mg/dL — AB (ref 65–99)
GLUCOSE-CAPILLARY: 130 mg/dL — AB (ref 65–99)
Glucose-Capillary: 105 mg/dL — ABNORMAL HIGH (ref 65–99)
Glucose-Capillary: 94 mg/dL (ref 65–99)

## 2016-12-28 LAB — COMPREHENSIVE METABOLIC PANEL
ALT: 18 U/L (ref 14–54)
AST: 12 U/L — ABNORMAL LOW (ref 15–41)
Albumin: 1.8 g/dL — ABNORMAL LOW (ref 3.5–5.0)
Alkaline Phosphatase: 46 U/L (ref 38–126)
Anion gap: 7 (ref 5–15)
BUN: 8 mg/dL (ref 6–20)
CO2: 23 mmol/L (ref 22–32)
Calcium: 8.5 mg/dL — ABNORMAL LOW (ref 8.9–10.3)
Chloride: 111 mmol/L (ref 101–111)
Creatinine, Ser: 0.41 mg/dL — ABNORMAL LOW (ref 0.44–1.00)
GFR calc Af Amer: >60 mL/min (ref >60)
GFR calc non Af Amer: >60 mL/min (ref >60)
Glucose, Bld: 114 mg/dL — ABNORMAL HIGH (ref 65–99)
Potassium: 3.8 mmol/L (ref 3.5–5.1)
Sodium: 141 mmol/L (ref 135–145)
Total Bilirubin: 0.3 mg/dL (ref 0.3–1.2)
Total Protein: 4.6 g/dL — ABNORMAL LOW (ref 6.5–8.1)

## 2016-12-28 LAB — BLOOD GAS, ARTERIAL
Acid-base deficit: 1.5 mmol/L (ref 0.0–2.0)
Bicarbonate: 23.1 mmol/L (ref 20.0–28.0)
Drawn by: 225631
FIO2: 40
MECHVT: 600 mL
O2 Saturation: 97.4 %
PEEP: 5 cmH2O
Patient temperature: 98.6
RATE: 20 resp/min
pCO2 arterial: 42.2 mmHg (ref 32.0–48.0)
pH, Arterial: 7.358 (ref 7.350–7.450)
pO2, Arterial: 98.1 mmHg (ref 83.0–108.0)

## 2016-12-28 LAB — POCT I-STAT, CHEM 8
BUN: 9 mg/dL (ref 6–20)
CALCIUM ION: 1.2 mmol/L (ref 1.15–1.40)
CHLORIDE: 103 mmol/L (ref 101–111)
CREATININE: 0.5 mg/dL (ref 0.44–1.00)
GLUCOSE: 192 mg/dL — AB (ref 65–99)
HCT: 33 % — ABNORMAL LOW (ref 36.0–46.0)
Hemoglobin: 11.2 g/dL — ABNORMAL LOW (ref 12.0–15.0)
Potassium: 4 mmol/L (ref 3.5–5.1)
Sodium: 140 mmol/L (ref 135–145)
TCO2: 26 mmol/L (ref 0–100)

## 2016-12-28 LAB — PHOSPHORUS: Phosphorus: 2.8 mg/dL (ref 2.5–4.6)

## 2016-12-28 LAB — TRIGLYCERIDES: Triglycerides: 155 mg/dL — ABNORMAL HIGH (ref <150)

## 2016-12-28 LAB — MAGNESIUM: Magnesium: 1.7 mg/dL (ref 1.7–2.4)

## 2016-12-28 LAB — PREALBUMIN: Prealbumin: 10.2 mg/dL — ABNORMAL LOW (ref 18–38)

## 2016-12-28 MED ORDER — SODIUM CHLORIDE 0.9 % IV SOLN
30.0000 meq | Freq: Once | INTRAVENOUS | Status: AC
  Administered 2016-12-28: 30 meq via INTRAVENOUS
  Filled 2016-12-28: qty 15

## 2016-12-28 MED ORDER — FAMOTIDINE IN NACL 20-0.9 MG/50ML-% IV SOLN: 20.0000 mg | Freq: Two times a day (BID) | INTRAVENOUS | Status: DC

## 2016-12-28 MED ORDER — FUROSEMIDE 10 MG/ML IJ SOLN
20.0000 mg | Freq: Two times a day (BID) | INTRAMUSCULAR | Status: DC
  Administered 2016-12-28 – 2017-01-01 (×8): 20 mg via INTRAVENOUS
  Filled 2016-12-28 (×8): qty 2

## 2016-12-28 MED ORDER — OSMOLITE 1.5 CAL PO LIQD
1000.0000 mL | ORAL | Status: DC
  Filled 2016-12-28: qty 1000

## 2016-12-28 MED ORDER — MAGNESIUM SULFATE 2 GM/50ML IV SOLN
2.0000 g | Freq: Once | INTRAVENOUS | Status: AC
  Administered 2016-12-28: 2 g via INTRAVENOUS
  Filled 2016-12-28: qty 50

## 2016-12-28 MED ORDER — ENOXAPARIN SODIUM 40 MG/0.4ML ~~LOC~~ SOLN
40.0000 mg | SUBCUTANEOUS | Status: DC
  Administered 2016-12-28 – 2017-01-05 (×9): 40 mg via SUBCUTANEOUS
  Filled 2016-12-28 (×10): qty 0.4

## 2016-12-28 MED ORDER — CLINIMIX E/DEXTROSE (5/15) 5 % IV SOLN
INTRAVENOUS | Status: AC
  Administered 2016-12-28: 18:00:00 via INTRAVENOUS
  Filled 2016-12-28: qty 1440

## 2016-12-28 NOTE — Progress Notes (Signed)
Pts. Arterial line dampened with inaccurate blood pressure readings and inability to draw blood, RN aware, RT redressed just prior in attempt to save site/line and noted to be leaking NS from site, pulled catheter intact, pressure dressed site.

## 2016-12-28 NOTE — Progress Notes (Signed)
2 Days Post-Op Procedure(s) (LRB): LEFT VIDEO ASSISTED THORACOSCOPY (VATS)/DECORTICATION (Left) VIDEO BRONCHOSCOPY WITH BRONCHIAL LAVAGE (N/A) Subjective: Patient appears alert and comfortable on vent ABG and chest x-ray show continued improvement Vent weaning per CCM T NA to continue for severe protein loss malnutrition with recent C. difficile colitis We'll place core track for initiation of gentle tube feed attempt  Objective: Vital signs in last 24 hours: Temp:  [98.6 F (37 C)-98.9 F (37.2 C)] 98.7 F (37.1 C) (02/03 2333) Pulse Rate:  [58-95] 61 (02/04 0700) Cardiac Rhythm: Normal sinus rhythm (02/04 0400) Resp:  [18-23] 20 (02/04 0700) BP: (94-150)/(52-87) 108/60 (02/04 0700) SpO2:  [95 %-100 %] 98 % (02/04 0721) Arterial Line BP: (75-120)/(53-103) 104/88 (02/04 0100) FiO2 (%):  [40 %] 40 % (02/04 0721)  Hemodynamic parameters for last 24 hours:  sinus rhythm afebrile  Intake/Output from previous day: 02/03 0701 - 02/04 0700 In: 3629.2 [P.O.:40; I.V.:2135.9; IV Piggyback:1013.3] Out: 1375 [Urine:985; Emesis/NG output:250; Stool:100; Chest Tube:40] Intake/Output this shift: No intake/output data recorded.  Extremities warm Breath sounds clear Minimal chest tube drainage today-removed posterior tube  Lab Results:  Recent Labs  12/27/16 0416 12/27/16 1653 12/28/16 0500  WBC 13.0*  --  9.4  HGB 10.2* 10.9* 9.5*  HCT 31.4* 32.0* 29.9*  PLT 440*  --  409*   BMET:  Recent Labs  12/27/16 0416 12/27/16 1653 12/28/16 0500  NA 141 142 141  K 3.4* 4.4 3.8  CL 115* 109 111  CO2 22  --  23  GLUCOSE 120* 116* 114*  BUN 9 10 8   CREATININE 0.47 0.40* 0.41*  CALCIUM 8.1*  --  8.5*    PT/INR:  Recent Labs  12/26/16 1212  LABPROT 17.4*  INR 1.41   ABG    Component Value Date/Time   PHART 7.358 12/28/2016 0404   HCO3 23.1 12/28/2016 0404   TCO2 26 12/27/2016 1657   ACIDBASEDEF 1.5 12/28/2016 0404   O2SAT 97.4 12/28/2016 0404   CBG (last 3)    Recent Labs  12/27/16 2328 12/28/16 0332 12/28/16 0901  GLUCAP 100* 105* 114*    Assessment/Plan: S/P Procedure(s) (LRB): LEFT VIDEO ASSISTED THORACOSCOPY (VATS)/DECORTICATION (Left) VIDEO BRONCHOSCOPY WITH BRONCHIAL LAVAGE (N/A) Broad-spectrum antibiotics pending culture results Nutrition, TPN now and start tube feeds soon Extubate when ready Removed posterior chest tube  LOS: 16 days    [name] 12/28/2016

## 2016-12-28 NOTE — Progress Notes (Signed)
Name: [name] MRN: [id number] DOB: [birth year]    ADMISSION DATE:  12/12/2016 CONSULTATION DATE: 1/19  REFERRING MD : Alvino Chapel, MD   CHIEF COMPLAINT: Asthma Exacerbation   BRIEF PATIENT DESCRIPTION: 51 yo female with PMH asthma, COPD, and bipolar disorder.  Presented on 1/19 hypoxic with working diagnosis of acute asthma exacerbation, multilobar Streptococcus pneumoniae PNA with evidence of abscess formation within the left upper lobe with a small left-sided pneumothorax as well as C.Diff. Intubated 1/19-1/27.   1/28 PCCM was called to evaluate patient for increasing oxygen requirement from room air to 5L [place], worsening tachypnea, and tachycardia. CT abdomen revealed a colonic ileus without evidence of bowel obstruction.  SIGNIFICANT EVENTS  2/2 : Pt had decortication L / VATS   STUDIES:  CXR 1/19>> Consolidations bilaterally CT head 1/19>> Left occipital lobe probable acute or early subacute infarction. No acute intracranial hemorrhage. 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 There is a small amount of fluid in this structure. Also a 1 cm nodular structure in the left lower lobe with minimal cavitation.  Abd xr 1/27>> bowel distended with gas CT Chest 2/1 > Moderate left hydropneumothorax, no change in size of pulmonary abscess in the lingula, interval increase in loculated fluid at the left lung base with scattered air pockets and lower lobe atelectasis  Cultures: Blood 1/19 >> Negative Respiratory 1/20 >> Strep Pneumo  RVP 1/20 >> Negative  Oral HSV 1/22 >> positive (completed acyclovir)  Blood 1/24 >> Negative  C diff 1/28 >> Antigen & PCR positive   Antibiotics: Azithromycin 1/19 - 1/21 Ceftriaxone 1/19 - 1/23 Acyclovir 1/22 - 1/27 Unasyn 1/24 >>  2/2 Zosyn 2/2 >  Vanco 1/29 > Flagyl > Eraxis 2/2 >   SUBJECTIVE:  Pt had VATS L / decortication on 2/2.   Tolerated procedure well. Noted to be hypercapneic post op so kept intubated. Gets tachypneic with PST.   VITAL SIGNS: Temp:  [98.6 F (37 C)-98.9 F (37.2 C)] 98.7 F (37.1 C) (02/03 2333) Pulse Rate:  [58-95] 88 (02/04 1122) Resp:  [18-23] 20 (02/04 1122) BP: (94-150)/(52-87) 139/81 (02/04 1122) SpO2:  [95 %-100 %] 98 % (02/04 1122) Arterial Line BP: (75-120)/(53-103) 104/88 (02/04 0100) FiO2 (%):  [40 %] 40 % (02/04 1159)  PHYSICAL EXAMINATION: General: Adult female, lying in bed. Sedated. On and off awake.  Tachypneic on the vent,  Neuro: Lethargic, follows commands, moves extremities   HEENT: normocephalic  Cardiovascular: Tachy, no MRG, S1/S2 Lungs: good ae. Crackles at bases.  Abdomen: distended, hypoactive bowel sounds, tender  Musculoskeletal: no acute deformities  Skin: warm, dry, intact     Recent Labs Lab 12/26/16 1212  12/27/16 0416 12/27/16 1653 12/28/16 0500  NA 144  < > 141 142 141  K 3.9  < > 3.4* 4.4 3.8  CL 114*  --  115* 109 111  CO2 20*  --  22  --  23  BUN 11  --  9 10 8   CREATININE 0.48  --  0.47 0.40* 0.41*  GLUCOSE 135*  < > 120* 116* 114*  < > = values in this interval not displayed.  Recent Labs Lab 12/26/16 1212  12/27/16 0416 12/27/16 1653 12/28/16 0500  HGB 9.3*  < > 10.2* 10.9*  9.5*  HCT 29.1*  < > 31.4* 32.0* 29.9*  WBC 19.6*  --  13.0*  --  9.4  PLT 657*  --  440*  --  409*  < > = values in this interval not displayed. Dg Chest Port 1 View  Result Date: 12/28/2016 CLINICAL DATA:  51 year old female status post left VATS drainage and debridement of left lower lobe lung abscess postoperative day 2. Initial encounter. EXAM: PORTABLE CHEST 1 VIEW COMPARISON:  12/27/2016 and earlier. FINDINGS: Portable AP semi upright view at at 0611 hours. Endotracheal tube tip between the level the clavicles and carina. Enteric tube  courses to the abdomen, tip not included. Stable right IJ central line. Two left chest tubes remain visible, the 3rd May have been removed. No pneumothorax. Residual patchy peripheral and lung base opacity on the left. Superimposed multilevel left rib fractures. Stable right lung ventilation. Stable cardiac size and mediastinal contours. IMPRESSION: 1. Two left chest tubes remain in place.  No pneumothorax. 2. Stable endotracheal, enteric tubes and right IJ central line. 3. Stable residual left lung base opacity. No new cardiopulmonary abnormality. Electronically Signed   By: Genevie Ann M.D.   On: 12/28/2016 07:39   Dg Chest Port 1 View  Result Date: 12/27/2016 CLINICAL DATA:  51 year old female status post VATS and decortication for left empyema. Initial encounter. EXAM: PORTABLE CHEST 1 VIEW COMPARISON:  12/26/2016 and earlier. FINDINGS: Portable AP semi upright view at 0559 hours. Endotracheal tube tip is at the clavicles. Enteric tube has been placed and courses to the abdomen, tip not included. Stable right IJ central line. Three left chest tubes remain in place. No pneumothorax identified. Mild regression of patchy residual left lung base opacity. Stable cardiac size and mediastinal contours. Resolved pulmonary vascular congestion. The right lung is clear allowing for portable technique. IMPRESSION: 1. Enteric tube placed and courses to the abdomen, tip not included. 2.  Otherwise stable lines and tubes, including 3 left chest tubes. 3. No pneumothorax. Resolved pulmonary vascular congestion. Mildly regressed residual patchy opacity at the left lung base. Electronically Signed   By: Genevie Ann M.D.   On: 12/27/2016 07:38   Dg Chest Port 1 View  Result Date: 12/26/2016 CLINICAL DATA:  Empyema of pleural space EXAM: PORTABLE CHEST 1 VIEW COMPARISON:  12/25/2016 radiographic and CT exams FINDINGS: Tip of endotracheal tube projects 3.4 cm above carina. RIGHT jugular central venous catheter with tip projecting over  SVC. LEFT thoracostomy tubes present. Upper normal size of cardiac silhouette. Mediastinal contours are vascularity normal. Decrease in LEFT pleural effusion since prior CT exam. No LEFT pneumothorax definitely visualized. Scattered mild infiltrates within RIGHT lung unchanged from prior CT. Bones unremarkable. IMPRESSION: Decrease in LEFT pleural effusion and resolution of LEFT pneumothorax post thoracostomy tube placement. Scattered pulmonary infiltrates. Electronically Signed   By: Lavonia Dana M.D.   On: 12/26/2016 17:24   Dg Abd Portable 1v  Result Date: 12/26/2016 CLINICAL DATA:  Nasogastric tube placement.  Initial encounter. EXAM: PORTABLE ABDOMEN - 1 VIEW COMPARISON:  Abdominal radiograph performed 12/24/2016 FINDINGS: The patient's enteric tube is seen ending overlying the antrum of the stomach. There has been interval placement of left-sided chest tubes. Clips are noted within the right upper quadrant, reflecting prior cholecystectomy. The visualized bowel gas pattern is grossly unremarkable. No free intra-abdominal air is seen, though evaluation for free air is limited on a single supine view. No acute osseous abnormalities are identified. IMPRESSION: Enteric tube noted ending overlying the antrum of the  stomach. Electronically Signed   By: Garald Balding M.D.   On: 12/26/2016 23:55    ASSESSMENT / PLAN:  Acute hypoxic respiratory failure secondary to multilobar Streptococcus pneumonia PNA + Empyema + hydrothorax S/P VATS/Decortication on 2/2 Asthma/COPD Exacerbation  Plan: On the vent.  PST. CXR better with regards to empyema but looks a little wet.  Diuresce 2/4 Maintain Saturation >88 Pt has been on Unasyn since admit.  Was switched to zosyn and eraxis post op on 2/2. Cont for now.  Continue Solu-Medrol 20 mg BID Continue Duonebs Q6H, pulmicort BID CT to suction. WBC better, CXR better.   Acute Encephalopathy 2/2 sepsis + agitation + anxiety Bipolar disorder Plan: Cont fentanyl   > wean off Versed PRN Cont Bipolar meds (lexapro, lamictal, seroquel, neurontin)   Ileus  Conservative mangement per gen surgery Keep NPO except meds Trial of TF or diet depending on whether she is extubated soon or not.  Abd looks more distended on 2/4. Plain KUB now.    Cdiff colitis cont Vanc PR and IV flagyl  Prophylaxis Heparin SQ once OK with surgery. On SCDs.  PPI for SUP    I spent  30  minutes of Critical Care time with this patient today.    Family : No family at bedside.    Monica Becton, MD 12/28/2016, 12:34 PM [facility] Pager [phone] After 3 pm or if no answer, call [phone]

## 2016-12-28 NOTE — Progress Notes (Signed)
PHARMACY - ADULT TOTAL PARENTERAL NUTRITION CONSULT NOTE   Pharmacy Consult for TPN Indication: Ileus / inability to tolerate enteral nutrition  Patient Measurements: Height: 5\' 4"  (162.6 cm) Weight: 169 lb 15.6 oz (77.1 kg) IBW/kg (Calculated) : 54.7 TPN AdjBW (KG): 60.1 Body mass index is 29.18 kg/m.  Assessment:  51 yo F presents on 1/19 with an asthma exacerbation. PMH includes asthma, bipolar disorder, COPD, depression, IBS. She was intubated on admission and extubated on 1/27. Found to have ischemic hepatitis with elevated LFTs. She was started on TFs on 1/22 but stopped on 1/26 and speech recommended dysphagia diet 1. On 1/30 rapid response was called due to tachypnea and abdominal pain. CT showed ileus without obstruction. Has been NPO for 8 days now. Had VATs procedure on 2/2 for empyema.   GI: NPO. Has severe C. Diff colitis. TPN started due to ileus and prolonged NPO status. Pt refused NG tube to suction but now has NG tube in place. Output is at 29ml over last 24 hrs. Albumin low at 1.8 and prealbumin low at 6. Last BM was 2/2. Ileus seems to have improved with BM and further film showing decrease in gaseous distension, could consider retrial of TFs again soon. Endo: CBGs controlled 110-140s on SSI. Also on methylpred Insulin requirements in the past 24 hours: 2 units of SSI  Lytes: wnl K low at 3.8 (goal > 4) but up slightly after replacement yesterday. Mg up slightly to 1.7 (goal > 2) after replacement. Phos up to 2.8 after replacement. CoCa 10.2 Renal: SCr stable, CrCl ~12ml/min. UOP ok at 0.50ml/kg/hr. D51/2NS at 11ml/hr. Pulm: Extubated on 1/27 but reintubated on 2/2. Cards: BP and HR ok Hepatobil: LFTS and Tbili wnl. TG ok at 155. Neuro: On fentanyl gtt, propofol gtt weaned off, escitalopram, lamictal ID: On Zosyn for PNA / abscess. Started anidulafungin on 2/3 due to yeast found in lung cx. Also on vancomycin enemas for C. Diff. Older TA shows strep pneumo, de-escalate  Zosyn? Afebrile, WBC down to wnl  Best Practices: SCDs, PPI TPN Access: CVC double lumen 2/2 >> TPN start date: 2/3 >>  Nutritional Goals (per RD recommendation on 2/3): KCal: [id number] Protein: 100-115 g  Current Nutrition:  NPO Clinimix E 5/15  Plan:  Increase Clinimix E 5/15 to 76ml/hr Hold 20% lipid emulsion for first 7 days for ICU patients per ASPEN guidelines (Start date 2/10) Propofol weaned off Continue D5 1/2NS at 53ml/hr per MD TPN provides 72 g of protein and 1022 kCals per day meeting ~65% of protein and kCal needs Add MVI in TPN Add TE every other day in TPN due to national backorder (Next due 2/5) Continue resistant SSI and adjust as needed Monitor TPN labs, Cmet, Phos, and Mg tomorrow, s/s of refeeding syndrome F/U improvement in ileus and clinical picture  Give Mg 2g IV x 1 again today Give potassium chloride 65mEq IV x 1  Consider retrial of TFs soon?  Elenor Quinones, PharmD, BCPS Clinical Pharmacist Pager [phone] 12/28/2016 7:05 AM

## 2016-12-28 NOTE — Progress Notes (Signed)
CT surgery p.m. Rounds  Patient failed trial of ventilator wean Posterior chest tube removed Remains on broad-spectrum coverage for lung abscess and empyema TPN for severe protein depletion malnutrition and ileus-colitis Start tube feeds through gastric tube tomorrow, tip is at pylorus

## 2016-12-29 ENCOUNTER — Inpatient Hospital Stay (HOSPITAL_COMMUNITY): Payer: Medicaid Other

## 2016-12-29 LAB — POCT I-STAT, CHEM 8
BUN: 8 mg/dL (ref 6–20)
CALCIUM ION: 1.01 mmol/L — AB (ref 1.15–1.40)
Chloride: 106 mmol/L (ref 101–111)
Creatinine, Ser: 0.3 mg/dL — ABNORMAL LOW (ref 0.44–1.00)
GLUCOSE: 138 mg/dL — AB (ref 65–99)
HCT: 27 % — ABNORMAL LOW (ref 36.0–46.0)
Hemoglobin: 9.2 g/dL — ABNORMAL LOW (ref 12.0–15.0)
POTASSIUM: 2.8 mmol/L — AB (ref 3.5–5.1)
SODIUM: 134 mmol/L — AB (ref 135–145)
TCO2: 28 mmol/L (ref 0–100)

## 2016-12-29 LAB — CBC
HCT: 31.6 % — ABNORMAL LOW (ref 36.0–46.0)
Hemoglobin: 10.2 g/dL — ABNORMAL LOW (ref 12.0–15.0)
MCH: 29.1 pg (ref 26.0–34.0)
MCHC: 32.3 g/dL (ref 30.0–36.0)
MCV: 90 fL (ref 78.0–100.0)
Platelets: 432 10*3/uL — ABNORMAL HIGH (ref 150–400)
RBC: 3.51 MIL/uL — ABNORMAL LOW (ref 3.87–5.11)
RDW: 16.2 % — ABNORMAL HIGH (ref 11.5–15.5)
WBC: 8.9 10*3/uL (ref 4.0–10.5)

## 2016-12-29 LAB — GLUCOSE, CAPILLARY
GLUCOSE-CAPILLARY: 122 mg/dL — AB (ref 65–99)
GLUCOSE-CAPILLARY: 123 mg/dL — AB (ref 65–99)
Glucose-Capillary: 117 mg/dL — ABNORMAL HIGH (ref 65–99)
Glucose-Capillary: 117 mg/dL — ABNORMAL HIGH (ref 65–99)
Glucose-Capillary: 166 mg/dL — ABNORMAL HIGH (ref 65–99)

## 2016-12-29 LAB — COMPREHENSIVE METABOLIC PANEL
ALT: 19 U/L (ref 14–54)
AST: 14 U/L — ABNORMAL LOW (ref 15–41)
Albumin: 2.1 g/dL — ABNORMAL LOW (ref 3.5–5.0)
Alkaline Phosphatase: 54 U/L (ref 38–126)
Anion gap: 10 (ref 5–15)
BUN: 9 mg/dL (ref 6–20)
CO2: 29 mmol/L (ref 22–32)
Calcium: 8.6 mg/dL — ABNORMAL LOW (ref 8.9–10.3)
Chloride: 101 mmol/L (ref 101–111)
Creatinine, Ser: 0.44 mg/dL (ref 0.44–1.00)
GFR calc Af Amer: >60 mL/min (ref >60)
GFR calc non Af Amer: >60 mL/min (ref >60)
Glucose, Bld: 118 mg/dL — ABNORMAL HIGH (ref 65–99)
Potassium: 3.5 mmol/L (ref 3.5–5.1)
Sodium: 140 mmol/L (ref 135–145)
Total Bilirubin: 0.4 mg/dL (ref 0.3–1.2)
Total Protein: 5.1 g/dL — ABNORMAL LOW (ref 6.5–8.1)

## 2016-12-29 LAB — DIFFERENTIAL
Basophils Absolute: 0 10*3/uL (ref 0.0–0.1)
Basophils Relative: 0 %
Eosinophils Absolute: 0 10*3/uL (ref 0.0–0.7)
Eosinophils Relative: 0 %
Lymphocytes Relative: 12 %
Lymphs Abs: 1.1 10*3/uL (ref 0.7–4.0)
Monocytes Absolute: 0.7 10*3/uL (ref 0.1–1.0)
Monocytes Relative: 8 %
Neutro Abs: 7.2 10*3/uL (ref 1.7–7.7)
Neutrophils Relative %: 80 %

## 2016-12-29 LAB — BLOOD GAS, ARTERIAL
ACID-BASE DEFICIT: 5 mmol/L — AB (ref 0.0–2.0)
Acid-Base Excess: 4 mmol/L — ABNORMAL HIGH (ref 0.0–2.0)
BICARBONATE: 21.8 mmol/L (ref 20.0–28.0)
Bicarbonate: 28.2 mmol/L — ABNORMAL HIGH (ref 20.0–28.0)
Drawn by: 225631
FIO2: 50
FIO2: 40
MECHVT: 600 mL
O2 Saturation: 97.6 %
O2 Saturation: 98.4 %
PCO2 ART: 56.3 mmHg — AB (ref 32.0–48.0)
PEEP: 5 cmH2O
PEEP: 5 cmH2O
PH ART: 7.209 — AB (ref 7.350–7.450)
Patient temperature: 97.7
Patient temperature: 98.4
RATE: 16 resp/min
RATE: 20 {breaths}/min
VT: 440 mL
pCO2 arterial: 43.6 mmHg (ref 32.0–48.0)
pH, Arterial: 7.426 (ref 7.350–7.450)
pO2, Arterial: 127 mmHg — ABNORMAL HIGH (ref 83.0–108.0)
pO2, Arterial: 117 mmHg — ABNORMAL HIGH (ref 83.0–108.0)

## 2016-12-29 LAB — PHOSPHORUS: Phosphorus: 3.6 mg/dL (ref 2.5–4.6)

## 2016-12-29 LAB — TRIGLYCERIDES: Triglycerides: 127 mg/dL

## 2016-12-29 LAB — MAGNESIUM: Magnesium: 1.5 mg/dL — ABNORMAL LOW (ref 1.7–2.4)

## 2016-12-29 LAB — PREALBUMIN: Prealbumin: 14.6 mg/dL — ABNORMAL LOW (ref 18–38)

## 2016-12-29 MED ORDER — OSMOLITE 1.2 CAL PO LIQD
1000.0000 mL | ORAL | Status: DC
  Filled 2016-12-29: qty 1000

## 2016-12-29 MED ORDER — FENTANYL CITRATE (PF) 100 MCG/2ML IJ SOLN
25.0000 ug | INTRAMUSCULAR | Status: DC | PRN
  Administered 2016-12-29 – 2016-12-30 (×2): 50 ug via INTRAVENOUS
  Filled 2016-12-29 (×2): qty 2

## 2016-12-29 MED ORDER — SODIUM CHLORIDE 0.9% FLUSH
10.0000 mL | Freq: Two times a day (BID) | INTRAVENOUS | Status: DC
  Administered 2016-12-29 – 2016-12-31 (×4): 10 mL
  Administered 2016-12-31: 20 mL
  Administered 2017-01-01 – 2017-01-08 (×7): 10 mL
  Administered 2017-01-08: 20 mL
  Administered 2017-01-09 – 2017-01-10 (×2): 10 mL
  Administered 2017-01-10: 20 mL
  Administered 2017-01-11 (×2): 10 mL

## 2016-12-29 MED ORDER — SODIUM CHLORIDE 0.9 % IV SOLN
30.0000 meq | Freq: Once | INTRAVENOUS | Status: AC
  Administered 2016-12-29: 30 meq via INTRAVENOUS
  Filled 2016-12-29: qty 15

## 2016-12-29 MED ORDER — OXYCODONE HCL 5 MG PO TABS: 10.0000 mg | ORAL_TABLET | ORAL | Status: DC | PRN

## 2016-12-29 MED ORDER — KETOROLAC TROMETHAMINE 15 MG/ML IJ SOLN
15.0000 mg | Freq: Four times a day (QID) | INTRAMUSCULAR | Status: AC
  Administered 2016-12-29 – 2016-12-30 (×4): 15 mg via INTRAVENOUS
  Filled 2016-12-29 (×4): qty 1

## 2016-12-29 MED ORDER — CHLORHEXIDINE GLUCONATE CLOTH 2 % EX PADS
6.0000 | MEDICATED_PAD | Freq: Every day | CUTANEOUS | Status: DC
  Administered 2017-01-03 – 2017-01-07 (×3): 6 via TOPICAL

## 2016-12-29 MED ORDER — SODIUM CHLORIDE 0.9% FLUSH
10.0000 mL | INTRAVENOUS | Status: DC | PRN
  Administered 2017-01-12: 10 mL
  Filled 2016-12-29: qty 40

## 2016-12-29 MED ORDER — TRACE MINERALS CR-CU-MN-SE-ZN 10-1000-500-60 MCG/ML IV SOLN
INTRAVENOUS | Status: AC
  Administered 2016-12-29: 17:00:00 via INTRAVENOUS
  Filled 2016-12-29: qty 1680

## 2016-12-29 MED ORDER — ORAL CARE MOUTH RINSE
15.0000 mL | Freq: Two times a day (BID) | OROMUCOSAL | Status: DC
  Administered 2016-12-31: 15 mL via OROMUCOSAL

## 2016-12-29 MED ORDER — SODIUM CHLORIDE 0.9 % IV SOLN
6.0000 g | Freq: Once | INTRAVENOUS | Status: DC
  Filled 2016-12-29: qty 12

## 2016-12-29 NOTE — Progress Notes (Signed)
3 Days Post-Op Procedure(s) (LRB): LEFT VIDEO ASSISTED THORACOSCOPY (VATS)/DECORTICATION (Left) VIDEO BRONCHOSCOPY WITH BRONCHIAL LAVAGE (N/A) Subjective: Stable on vent Chest tube output decreasing- one tube in posterior pleural space, one tube in abscess cavity Start TF today, cont TPN  Objective: Vital signs in last 24 hours: Temp:  [96.5 F (35.8 C)-99 F (37.2 C)] 98.4 F (36.9 C) (02/05 0400) Pulse Rate:  [63-111] 72 (02/05 0727) Cardiac Rhythm: Normal sinus rhythm (02/05 0400) Resp:  [8-34] 20 (02/05 0727) BP: (107-170)/(72-99) 165/82 (02/05 0727) SpO2:  [95 %-100 %] 100 % (02/05 0727) FiO2 (%):  [30 %-40 %] 30 % (02/05 0727) Weight:  [171 lb 8.3 oz (77.8 kg)] 171 lb 8.3 oz (77.8 kg) (02/05 0500)  Hemodynamic parameters for last 24 hours:  afebrile  Intake/Output from previous day: 02/04 0701 - 02/05 0700 In: 2825.1 [I.V.:2005.1; NG/GT:160; IV Piggyback:450] Out: [id number] [Urine:5690; Chest Tube:180] Intake/Output this shift: No intake/output data recorded.  Coarse breath sounds Neuro intact abd non tender Lab Results:  Recent Labs  12/28/16 0500 12/28/16 1618 12/29/16 0430  WBC 9.4  --  8.9  HGB 9.5* 11.2* 10.2*  HCT 29.9* 33.0* 31.6*  PLT 409*  --  432*   BMET:  Recent Labs  12/28/16 0500 12/28/16 1618 12/29/16 0430  NA 141 140 140  K 3.8 4.0 3.5  CL 111 103 101  CO2 23  --  29  GLUCOSE 114* 192* 118*  BUN 8 9 9   CREATININE 0.41* 0.50 0.44  CALCIUM 8.5*  --  8.6*    PT/INR:  Recent Labs  12/26/16 1212  LABPROT 17.4*  INR 1.41   ABG    Component Value Date/Time   PHART 7.426 12/29/2016 0500   HCO3 28.2 (H) 12/29/2016 0500   TCO2 26 12/28/2016 1618   ACIDBASEDEF 1.5 12/28/2016 0404   O2SAT 98.4 12/29/2016 0500   CBG (last 3)   Recent Labs  12/28/16 2005 12/29/16 0031 12/29/16 0409  GLUCAP 94 117* 117*    Assessment/Plan: S/P Procedure(s) (LRB): LEFT VIDEO ASSISTED THORACOSCOPY (VATS)/DECORTICATION (Left) VIDEO BRONCHOSCOPY  WITH BRONCHIAL LAVAGE (N/A) Vent wean per CCM Leave both tubes today    LOS: 17 days    Tharon Aquas Trigt III 12/29/2016

## 2016-12-29 NOTE — Progress Notes (Signed)
PHARMACY - ADULT TOTAL PARENTERAL NUTRITION CONSULT NOTE   Pharmacy Consult for TPN Indication: Ileus / inability to tolerate enteral nutrition  Patient Measurements: Height: 5\' 4"  (162.6 cm) Weight: 171 lb 8.3 oz (77.8 kg) IBW/kg (Calculated) : 54.7 TPN AdjBW (KG): 60.1 Body mass index is 29.44 kg/m.  Assessment:  51 yo F presents on 1/19 with an asthma exacerbation. PMH includes asthma, bipolar disorder, COPD, depression, IBS. She was intubated on admission and extubated on 1/27. Found to have ischemic hepatitis with elevated LFTs. She was started on TFs on 1/22 but stopped on 1/26 and speech recommended dysphagia diet 1. On 1/30 rapid response was called due to tachypnea and abdominal pain. CT showed ileus without obstruction. Has been NPO for 8 days now. Had VATs procedure on 2/2 for empyema.   GI: NPO. Has severe C. Diff colitis. TPN started due to ileus and prolonged NPO status. Pt refused NG tube to suction but now has NG tube in place. Output not charted over last 24 hrs. Albumin low at 2.1 and prealbumin low, but improving at 14.6. Last BM was 2/4. Ileus seems to have improved with BM and further film showing decrease in gaseous distension. Endo: CBGs controlled 90-140s on SSI. Also on methylpred Insulin requirements in the past 24 hours: 6 units of SSI  Lytes: wnl K low at 3.5 (goal > 4), Mg low at 1.5 (goal > 2). Phos up to 3.6 after replacement. CoCa 10.1 - K & Mg replaced Renal: SCr stable, CrCl ~56ml/min. UOP ok at 0.14ml/kg/hr. D51/2NS KVO. Pulm: Extubated on 1/27 but reintubated on 2/2. Cards: BP and HR ok Hepatobil: LFTS and Tbili wnl. TG ok at 127 Neuro: On fentanyl gtt, propofol gtt weaned off, escitalopram, lamictal ID: On Zosyn for PNA / abscess. Started anidulafungin on 2/3 due to yeast found in lung cx. Also on vancomycin enemas for C. Diff. Older TA shows strep pneumo, de-escalate Zosyn? Afebrile, WBC down to wnl  Best Practices: SCDs, PPI TPN Access: CVC double  lumen 2/2 >> TPN start date: 2/3 >>  Nutritional Goals (per RD recommendation on 2/3): KCal: [id number] Protein: 100-115 g  Current Nutrition:  NPO Clinimix E 5/15  Plan:  Increase Clinimix E 5/15 to 1ml/hr Hold 20% lipid emulsion for first 7 days for ICU patients per ASPEN guidelines (Start date 2/10) TPN provides 84 g of protein and 1193 kCals per day meeting ~75% of protein and kCal needs Add MVI in TPN  Add TE every other day in TPN due to national backorder (Next due 2/5) Continue resistant SSI and adjust as needed Monitor TPN labs, s/s of refeeding syndrome F/U improvement in ileus and clinical picture Monitor tolerability of TF trial today  Levester Fresh, PharmD, BCPS, BCCCP Clinical Pharmacist Clinical phone for 12/29/2016 from 7a-3:30p: [phone] If after 3:30p, please call main pharmacy at: [phone] 12/29/2016 8:22 AM

## 2016-12-29 NOTE — Progress Notes (Signed)
 ICU Electrolyte Replacement Protocol  Patient Name: [name] DOB: 05/06/1966 MRN: [id number]  Date of Service  12/29/2016   HPI/Events of Note    Recent Labs Lab 12/23/16 0222  12/25/16 0332 12/26/16 0326 12/26/16 1212  12/27/16 0416 12/27/16 1653 12/28/16 0500 12/28/16 1618 12/29/16 0430  NA 145  < > 141 141 144  < > 141 142 141 140 140  K 3.5  < > 3.6 4.0 3.9  < > 3.4* 4.4 3.8 4.0 3.5  CL 109  < > 110 112* 114*  --  115* 109 111 103 101  CO2 25  < > 21* 20* 20*  --  22  --  23  --  29  GLUCOSE 108*  < > 104* 128* 135*  < > 120* 116* 114* 192* 118*  BUN 12  < > 7 10 11  --  9 10 8 9 9  CREATININE 0.57  < > 0.41* 0.46 0.48  --  0.47 0.40* 0.41* 0.50 0.44  CALCIUM 8.9  < > 8.5* 8.5* 8.6*  --  8.1*  --  8.5*  --  8.6*  MG 1.8  --  1.6*  --   --   --  1.6*  --  1.7  --  1.5*  PHOS  --   --  2.4*  --   --   --  2.3*  --  2.8  --  3.6  < > = values in this interval not displayed.  Estimated Creatinine Clearance: 83.9 mL/min (by C-G formula based on SCr of 0.44 mg/dL).  Intake/Output      02 /04 0701 - 02/05 0700   I.V. (mL/kg) 1807.3 (23.2)   Other 210   NG/GT 160   IV Piggyback 450   Total Intake(mL/kg) 2627.3 (33.8)   Urine (mL/kg/hr) 5630 (3)   Chest Tube 160 (0.1)   Total Output 5790   Net -3162.7        - I/O DETAILED x24h    Total I/O In: [id number] [I.V.:1045; Other:210; NG/GT:160; IV Piggyback:200] Out: [id number] [Urine:3130; Chest Tube:100] - I/O THIS SHIFT    ASSESSMENT   eICURN Interventions  ELECTROLYTES REPLACED USING ICU PROTOCOL   ASSESSMENT: MAJOR ELECTROLYTE    [name] 12/29/2016, 6:31 AM

## 2016-12-29 NOTE — Progress Notes (Signed)
Name: [name] MRN: [id number] DOB: 1966/07/27    ADMISSION DATE:  12/12/2016 CONSULTATION DATE: 1/19  REFERRING MD : Alvino Chapel, MD   CHIEF COMPLAINT: Asthma Exacerbation   BRIEF PATIENT DESCRIPTION: 51 yo female with PMH asthma, COPD, and bipolar disorder.  Presented on 1/19 hypoxic with working diagnosis of acute asthma exacerbation, multilobar Streptococcus pneumoniae PNA with evidence of abscess formation within the left upper lobe with a small left-sided pneumothorax as well as C.Diff. Intubated 1/19-1/27.   1/28 PCCM was called to evaluate patient for increasing oxygen requirement from room air to 5L [place], worsening tachypnea, and tachycardia. CT abdomen revealed a colonic ileus without evidence of bowel obstruction.  SIGNIFICANT EVENTS  2/2 : Pt had decortication L / VATS   STUDIES:  CXR 1/19>> Consolidations bilaterally CT head 1/19>> Left occipital lobe probable acute or early subacute infarction. No acute intracranial hemorrhage. 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 There is a small amount of fluid in this structure. Also a 1 cm nodular structure in the left lower lobe with minimal cavitation.  Abd xr 1/27>> bowel distended with gas CT Chest 2/1 > Moderate left hydropneumothorax, no change in size of pulmonary abscess in the lingula, interval increase in loculated fluid at the left lung base with scattered air pockets and lower lobe atelectasis  Cultures: Blood 1/19 >> Negative Respiratory 1/20 >> Strep Pneumo  RVP 1/20 >> Negative  Oral HSV 1/22 >> positive (completed acyclovir)  Blood 1/24 >> Negative  C diff 1/28 >> Antigen & PCR positive   Antibiotics: Azithromycin 1/19 - 1/21 Ceftriaxone 1/19 - 1/23 Acyclovir 1/22 - 1/27 Unasyn 1/24 >>  2/2 Zosyn 2/2 >  Vanco enteral 1/29 > Flagyl IV 2/3 > Eraxis 2/2 >   SUBJECTIVE:  Tolerating PSV this am Awake and following commands  VITAL SIGNS: Temp:  [96.5 F (35.8 C)-99 F (37.2 C)] 98.4 F (36.9 C) (02/05 0400) Pulse Rate:  [63-111] 72 (02/05 0727) Resp:  [8-34] 20 (02/05 0727) BP: (107-170)/(72-99) 165/82 (02/05 0727) SpO2:  [96 %-100 %] 100 % (02/05 0727) FiO2 (%):  [30 %-40 %] 30 % (02/05 0727) Weight:  [77.8 kg (171 lb 8.3 oz)] 77.8 kg (171 lb 8.3 oz) (02/05 0500)  PHYSICAL EXAMINATION: General: ill appearing, on MV, no distress on PSV Neuro: awake and follows commands, good cough HEENT: ETT in place, Op clear Cardiovascular: regular, no M Lungs: bibasilar crackle, L chest tubes in place Abdomen: soft, mildly distended, hypoactive BS Musculoskeletal: no deformities Skin: no rash or lesions.    Recent Labs Lab 12/27/16 0416  12/28/16 0500 12/28/16 1618 12/29/16 0430  NA 141  < > 141 140 140  K 3.4*  < > 3.8 4.0 3.5  CL 115*  < > 111 103 101  CO2 22  --  23  --  29  BUN 9  < > 8 9 9   CREATININE 0.47  < > 0.41* 0.50 0.44  GLUCOSE 120*  < > 114* 192* 118*  < > = values in this interval not displayed.  Recent Labs Lab 12/27/16 0416  12/28/16 0500 12/28/16 1618 12/29/16 0430  HGB 10.2*  < > 9.5* 11.2* 10.2*  HCT 31.4*  < > 29.9* 33.0* 31.6*  WBC 13.0*  --  9.4  --  8.9  PLT 440*  --  409*  --  432*  < > = values in this interval not displayed. Dg Chest Port 1 View  Result Date: 12/29/2016 CLINICAL DATA:  Intubated patient, left-sided chest tubes. Status post thoracoscopy, decortication, and bronchial lavage 3 days ago EXAM: PORTABLE CHEST 1 VIEW COMPARISON:  Portable chest x-ray of December 28, 2016. FINDINGS: The lungs are well-expanded. The interstitial markings have improved. There is no focal infiltrate. There is no pneumothorax nor large pleural effusion. The 2 left-sided chest tubes are in stable position. The heart is top-normal in size. The  pulmonary vascularity is not engorged. The endotracheal tube tip lies approximately 1.9 cm above the carina. The esophagogastric tube tip projects below the inferior margin of the image. The right internal jugular venous catheter tip projects over the midportion of the SVC. IMPRESSION: Improved appearance of the pulmonary interstitium consistent with improved aeration and decreased interstitial edema. No pneumothorax or significant pleural effusion. No focal pneumonia. The support tubes are in reasonable position. Electronically Signed   By: David  Martinique M.D.   On: 12/29/2016 07:27   Dg Chest Port 1 View  Result Date: 12/28/2016 CLINICAL DATA:  51 year old female status post left VATS drainage and debridement of left lower lobe lung abscess postoperative day 2. Initial encounter. EXAM: PORTABLE CHEST 1 VIEW COMPARISON:  12/27/2016 and earlier. FINDINGS: Portable AP semi upright view at at 0611 hours. Endotracheal tube tip between the level the clavicles and carina. Enteric tube courses to the abdomen, tip not included. Stable right IJ central line. Two left chest tubes remain visible, the 3rd May have been removed. No pneumothorax. Residual patchy peripheral and lung base opacity on the left. Superimposed multilevel left rib fractures. Stable right lung ventilation. Stable cardiac size and mediastinal contours. IMPRESSION: 1. Two left chest tubes remain in place.  No pneumothorax. 2. Stable endotracheal, enteric tubes and right IJ central line. 3. Stable residual left lung base opacity. No new cardiopulmonary abnormality. Electronically Signed   By: Genevie Ann M.D.   On: 12/28/2016 07:39   Dg Abd Portable 1v  Result Date: 12/28/2016 CLINICAL DATA:  Abdominal distension EXAM: PORTABLE ABDOMEN - 1 VIEW COMPARISON:  12/26/2016 abdominal radiograph FINDINGS: Enteric tube terminates in the distal stomach near the pylorus. Cholecystectomy clips are seen in the right upper quadrant of the abdomen. No disproportionately  dilated small bowel loops. Mild gas in stool throughout the large bowel. No evidence of pneumatosis or pneumoperitoneum. Stable blunting of the costophrenic angles at the lung bases bilaterally. No pathologic soft tissue calcifications. IMPRESSION: 1. Enteric tube terminates in the distal stomach in the region of the pylorus . 2. Nonobstructive bowel gas pattern. Electronically Signed   By: Ilona Sorrel M.D.   On: 12/28/2016 13:15    ASSESSMENT / PLAN:  Acute hypoxic respiratory failure secondary to multilobar Streptococcus pneumonia PNA + Empyema + hydrothorax S/P VATS/Decortication on 2/2 Asthma/COPD Exacerbation, no wheeze on exam 2/5 Plan: Tolerating SBT > goal extubate am 2/5 abx / antifungal as above. Note broadened on 2/2. ? Planned duration rx D/c steroids 2/5 and follow Continue Duonebs Q6H, pulmicort BID Chest tubes to suction, management per Dr Darcey Nora. Looks like output is decreasing some.  Follow CXR, tube output, WBC  Acute Encephalopathy 2/2 sepsis + agitation + anxiety Bipolar disorder Plan: Minimize narcs and sedation Continue lexapro, lamictal, seroquel, neurontin   Cdiff colitis c/b ileus On vanco enema, flagyl IV NPO for now, trial PO once  extubated' Started on TPN, goal d/c soon Appreciate CCS eval and f/u  Prophylaxis enoxaparin pantoprazole    Family : No family at bedside.    Independent CC time 35 minutes  Baltazar Apo, MD, PhD 12/29/2016, 9:43 AM [facility] [phone] or if no answer [phone]

## 2016-12-29 NOTE — Procedures (Signed)
Extubation Procedure Note  Patient Details:   Name: [name] DOB: 08-31-1966 MRN: [id number]   Airway Documentation:     Evaluation  O2 sats: stable throughout Complications: No apparent complications Patient did tolerate procedure well. Bilateral Breath Sounds: Diminished, Expiratory wheezes   Yes   Pt extubated to 5L [place] per MD order. Pt stable throughout with no complications. Pt able to speak and has a weak, productive cough post extubation. Pt encouraged to use [place]. RT will continue to monitor.   Laymond Purser M 12/29/2016, 10:39 AM

## 2016-12-29 NOTE — Progress Notes (Signed)
      [address]       [place] [address]             [phone]      POD # 2 VATS, decortication  BP (!) 159/93   Pulse (!) 110   Temp 98.2 F (36.8 C) (Oral)   Resp 15   Ht 5\' 4"  (1.626 m)   Wt 171 lb 8.3 oz (77.8 kg)   SpO2 (!) 85%   BMI 29.44 kg/m    Intake/Output Summary (Last 24 hours) at 12/29/16 1757 Last data filed at 12/29/16 1600  Gross per 24 hour  Intake          3740.08 ml  Output             8275 ml  Net         -4534.92 ml   Extubated this afternoon. Pulmonary hygiene of great importance  Revonda Standard. Roxan Hockey, MD [facility] [phone]

## 2016-12-30 ENCOUNTER — Inpatient Hospital Stay (HOSPITAL_COMMUNITY): Payer: Medicaid Other

## 2016-12-30 DIAGNOSIS — J869 Pyothorax without fistula: Secondary | ICD-10-CM

## 2016-12-30 LAB — GLUCOSE, CAPILLARY
GLUCOSE-CAPILLARY: 101 mg/dL — AB (ref 65–99)
GLUCOSE-CAPILLARY: 122 mg/dL — AB (ref 65–99)
GLUCOSE-CAPILLARY: 136 mg/dL — AB (ref 65–99)
GLUCOSE-CAPILLARY: 108 mg/dL — AB (ref 65–99)
Glucose-Capillary: 115 mg/dL — ABNORMAL HIGH (ref 65–99)
Glucose-Capillary: 118 mg/dL — ABNORMAL HIGH (ref 65–99)

## 2016-12-30 LAB — BASIC METABOLIC PANEL
Anion gap: 11 (ref 5–15)
BUN: 11 mg/dL (ref 6–20)
CALCIUM: 9 mg/dL (ref 8.9–10.3)
CO2: 32 mmol/L (ref 22–32)
CREATININE: 0.44 mg/dL (ref 0.44–1.00)
Chloride: 97 mmol/L — ABNORMAL LOW (ref 101–111)
GFR calc Af Amer: >60 mL/min (ref >60)
GFR calc non Af Amer: >60 mL/min (ref >60)
Glucose, Bld: 115 mg/dL — ABNORMAL HIGH (ref 65–99)
Potassium: 3.6 mmol/L (ref 3.5–5.1)
Sodium: 140 mmol/L (ref 135–145)

## 2016-12-30 LAB — POCT I-STAT, CHEM 8
BUN: 14 mg/dL (ref 6–20)
CALCIUM ION: 1.18 mmol/L (ref 1.15–1.40)
CHLORIDE: 96 mmol/L — AB (ref 101–111)
Creatinine, Ser: 0.5 mg/dL (ref 0.44–1.00)
Glucose, Bld: 125 mg/dL — ABNORMAL HIGH (ref 65–99)
HCT: 35 % — ABNORMAL LOW (ref 36.0–46.0)
HEMOGLOBIN: 11.9 g/dL — AB (ref 12.0–15.0)
Potassium: 4 mmol/L (ref 3.5–5.1)
SODIUM: 139 mmol/L (ref 135–145)
TCO2: 32 mmol/L (ref 0–100)

## 2016-12-30 LAB — MAGNESIUM: Magnesium: 1.5 mg/dL — ABNORMAL LOW (ref 1.7–2.4)

## 2016-12-30 LAB — PHOSPHORUS: Phosphorus: 3.3 mg/dL (ref 2.5–4.6)

## 2016-12-30 MED ORDER — LABETALOL HCL 5 MG/ML IV SOLN
INTRAVENOUS | Status: AC
  Filled 2016-12-30: qty 4

## 2016-12-30 MED ORDER — METOPROLOL TARTRATE 25 MG PO TABS
25.0000 mg | ORAL_TABLET | Freq: Two times a day (BID) | ORAL | Status: DC
  Administered 2016-12-30 – 2017-01-12 (×25): 25 mg via ORAL
  Filled 2016-12-30 (×26): qty 1

## 2016-12-30 MED ORDER — VANCOMYCIN HCL IN DEXTROSE 1-5 GM/200ML-% IV SOLN
1000.0000 mg | Freq: Two times a day (BID) | INTRAVENOUS | Status: DC
  Administered 2016-12-30 – 2017-01-02 (×6): 1000 mg via INTRAVENOUS
  Filled 2016-12-30 (×6): qty 200

## 2016-12-30 MED ORDER — OXYCODONE HCL 5 MG PO TABS
5.0000 mg | ORAL_TABLET | ORAL | Status: DC | PRN
  Administered 2016-12-30 – 2017-01-03 (×18): 5 mg via ORAL
  Filled 2016-12-30 (×19): qty 1

## 2016-12-30 MED ORDER — LABETALOL HCL 5 MG/ML IV SOLN
10.0000 mg | INTRAVENOUS | Status: DC | PRN
  Administered 2016-12-30 – 2017-01-09 (×10): 10 mg via INTRAVENOUS
  Filled 2016-12-30 (×9): qty 4

## 2016-12-30 MED ORDER — CLINIMIX E/DEXTROSE (5/15) 5 % IV SOLN
INTRAVENOUS | Status: AC
  Administered 2016-12-30: 17:00:00 via INTRAVENOUS
  Filled 2016-12-30: qty 2000

## 2016-12-30 MED ORDER — MAGNESIUM SULFATE 4 GM/100ML IV SOLN
4.0000 g | Freq: Once | INTRAVENOUS | Status: AC
  Administered 2016-12-30: 4 g via INTRAVENOUS
  Filled 2016-12-30: qty 100

## 2016-12-30 MED ORDER — SODIUM CHLORIDE 0.9 % IV SOLN
30.0000 meq | Freq: Once | INTRAVENOUS | Status: DC
  Filled 2016-12-30: qty 15

## 2016-12-30 MED ORDER — GERHARDT'S BUTT CREAM
TOPICAL_CREAM | CUTANEOUS | Status: DC | PRN
  Administered 2017-01-07: 18:00:00 via TOPICAL
  Filled 2016-12-30: qty 1

## 2016-12-30 NOTE — Progress Notes (Addendum)
Speech Language Pathology Treatment: Dysphagia  Patient Details Name: [name] MRN: [id number] DOB: [birth year] Today's Date: 12/30/2016 Time: [id number] SLP Time Calculation (min) (ACUTE ONLY): 26 min  Assessment / Plan / Recommendation Clinical Impression  Oral care was completed before PO intake and coughing was noted prior to and during PO consumption. [name] was given trials of pureed solids and honey thick liquids and exhibited intermittent immediate and delayed coughing. Pt required cueing and reminders for small sips/bites. Recommend continuing current diet of Dys 1 (pureed) solids, honey thick liquids, meds whole in puree. ST will monitor closely and will continue to f/u for treatment to assess swallow functioning at bedside and possible instrumental testing for diet upgrade.   HPI HPI: 51 y/o F, 54 pk year smoker, with Hx of COPD/asthma and bipolar disorder admitted 1/19 with SOB. Working diagnosis of acute asthma exacerbation, multilobar PNA with resultant hypoxic respiratory failure. Intubated 12/12/16 to 12/20/16. Left VATS procedure on 12/26/16. Reintubated 2/2-2/5.      SLP Plan  Continue with current plan of care     Recommendations  Diet recommendations: Dysphagia 1 (puree);Honey-thick liquid Liquids provided via: Cup;No straw Medication Administration: Whole meds with puree Supervision: Intermittent supervision to cue for compensatory strategies Compensations: Slow rate;Small sips/bites Postural Changes and/or Swallow Maneuvers: Seated upright 90 degrees                Oral Care Recommendations: Oral care BID Follow up Recommendations: Other (comment) (TBD) Plan: Continue with current plan of care       [place] , [place] 12/30/2016, 9:44 AM

## 2016-12-30 NOTE — Progress Notes (Signed)
Physical Therapy Treatment Patient Details Name: [name] MRN: [id number] DOB: 08/27/1966 Today's Date: 12/30/2016    History of Present Illness 51 y/o F, 20 pk year smoker, with Hx of COPD/asthma and bipolar disorder admitted 1/19 with SOB.  Working diagnosis of acute asthma exacerbation, multilobar PNA with resultant hypoxic respiratory failure.  Intubated 12/12/16 to 12/20/16.  Extubated 1/28. Pt transferred to step down on 1/29 with ileus. NG placed which pt pulled out.    PT Comments    Pt up in chair today. Max encouragement to amb. Pt perseverating on "pooping." Con't to recommend SNF upon d/c as pt very deconditioned and requiring assistx2 for all mobility and hygiene.  Follow Up Recommendations  SNF     Equipment Recommendations       Recommendations for Other Services       Precautions / Restrictions Precautions Precautions: Fall Restrictions Weight Bearing Restrictions: No    Mobility  Bed Mobility               General bed mobility comments: pt up in chair upon PT arrival  Transfers Overall transfer level: Needs assistance Equipment used:  (eva walker) Transfers: Sit to/from Stand Sit to Stand: Min assist;+2 physical assistance;+2 safety/equipment         General transfer comment: max directional v/c's, minAx2 to power up from chair and maintain balance to transition hands to walker  Ambulation/Gait Ambulation/Gait assistance: Mod assist;+2 safety/equipment Ambulation Distance (Feet): 7 Feet Assistive device:  (eva walker) Gait Pattern/deviations: Step-to pattern Gait velocity: dec Gait velocity interpretation: Below normal speed for age/gender General Gait Details: max encoruagement to participate and walk to door   Stairs            Wheelchair Mobility    Modified Rankin (Stroke Patients Only)       Balance Overall balance assessment: Needs assistance Sitting-balance support: Feet supported Sitting balance-Leahy Scale:  Fair     Standing balance support: During functional activity Standing balance-Leahy Scale: Poor Standing balance comment: reliant on RW, stood x1 min with minAx2 for hygiene                    Cognition Arousal/Alertness: Awake/alert Behavior During Therapy: Flat affect Overall Cognitive Status: Impaired/Different from baseline (has known psych history) Area of Impairment: Following commands;Safety/judgement;Problem solving       Following Commands: Follows one step commands with increased time Safety/Judgement: Decreased awareness of safety;Decreased awareness of deficits   Problem Solving: Decreased initiation;Difficulty sequencing;Requires verbal cues;Requires tactile cues      Exercises      General Comments General comments (skin integrity, edema, etc.): pt with flexiseal and foley catheter and chest tube      Pertinent Vitals/Pain Pain Assessment: Faces Pain Score: 8  Faces Pain Scale: Hurts little more Pain Location: stomach Pain Intervention(s): Patient requesting pain meds-RN notified    Home Living                      Prior Function            PT Goals (current goals can now be found in the care plan section) Acute Rehab PT Goals Patient Stated Goal: to poop Progress towards PT goals: Progressing toward goals    Frequency    Min 3X/week      PT Plan Current plan remains appropriate    Co-evaluation             End of Session Equipment Utilized During  Treatment: Oxygen Activity Tolerance: Patient limited by fatigue Patient left: in chair;with call bell/phone within reach;with nursing/sitter in room     Time: 1011-1039 PT Time Calculation (min) (ACUTE ONLY): 28 min  Charges:  $Gait Training: 8-22 mins $Therapeutic Activity: 8-22 mins                    G Codes:      Arnold Line 04-Jan-2017, 12:13 PM   Kittie Plater, PT, DPT Pager #: [phone] Office #: [phone]

## 2016-12-30 NOTE — Progress Notes (Signed)
PHARMACY - ADULT TOTAL PARENTERAL NUTRITION CONSULT NOTE   Pharmacy Consult for TPN Indication: Ileus / inability to tolerate enteral nutrition  Patient Measurements: Height: 5' 1.5" (156.2 cm) Weight: 170 lb 3.1 oz (77.2 kg) IBW/kg (Calculated) : 48.95 TPN AdjBW (KG): 56 Body mass index is 31.64 kg/m.  Assessment:  51 yo F presents on 1/19 with an asthma exacerbation. PMH includes asthma, bipolar disorder, COPD, depression, IBS. She was intubated on admission and extubated on 1/27. Found to have ischemic hepatitis with elevated LFTs. She was started on TFs on 1/22 but stopped on 1/26 and speech recommended dysphagia diet 1. On 1/30 rapid response was called due to tachypnea and abdominal pain. CT showed ileus without obstruction. Has been NPO for 8 days now. Had VATs procedure on 2/2 for empyema.   GI: NPO. Has severe C. Diff colitis. TPN started due to ileus and prolonged NPO status. Albumin low at 2.1 and prealbumin low, but improving at 14.6. Last BM was 2/5. Ileus seems to have improved with BM and further film showing decrease in gaseous distension. Patient to attempt clear liquids today  Endo: CBGs controlled 90-120s on SSI. Also on methylpred Insulin requirements in the past 24 hours: 2 units of SSI  Lytes: wnl K low at 3.6 (goal > 4), Mg low at 1.5 (goal > 2). Phos 3.3.Ca 9 Renal: SCr stable, CrCl ~4ml/min. UOP ok at 2.23ml/kg/hr. D51/2NS KVO. Pulm: Extubated on 1/27 but reintubated on 2/2, now extubated again 2/5 Cards: BP and HR ok Hepatobil: LFTS and Tbili wnl. TG ok at 127 Neuro:escitalopram, lamictal ID: On Zosyn for PNA / abscess. Started anidulafungin on 2/3 due to yeast found in lung cx. Also on vancomycin enemas for C. Diff. Older TA shows strep pneumo, de-escalate Zosyn? Afebrile, WBC down to wnl  Best Practices: SCDs, PPI TPN Access: CVC double lumen 2/2 >> TPN start date: 2/3 >>  Nutritional Goals (per RD recommendation on 2/3): KCal: [id number] Protein: 100-115  g  Current Nutrition:  NPO Clinimix E 5/15  Plan:  Continue Clinimix E 5/15 to 14ml/hr Hold 20% lipid emulsion for first 7 days for ICU patients per ASPEN guidelines (Start date 2/10) TPN provides 84 g of protein and 1193 kCals per day meeting ~75% of protein and kCal needs Continue MVI in TPN  Add TE every other day in TPN due to national backorder (Next due 2/7) Replace K with 30 mEq x 1, Mg with 4 g x 1 Continue resistant SSI and adjust as needed Monitor TPN labs, s/s of refeeding syndrome - Bmet and Mg in am F/U improvement in ileus and clinical picture Monitor tolerability of clear liquids today  Levester Fresh, PharmD, BCPS, BCCCP Clinical Pharmacist Clinical phone for 12/30/2016 from 7a-3:30p: [phone] If after 3:30p, please call main pharmacy at: [phone] 12/30/2016 8:36 AM

## 2016-12-30 NOTE — Progress Notes (Addendum)
[facility] DAILY ICU PROGRESS NOTE                   [address]            [place] [address]          [phone]   4 Days Post-Op Procedure(s) (LRB): LEFT VIDEO ASSISTED THORACOSCOPY (VATS)/DECORTICATION (Left) VIDEO BRONCHOSCOPY WITH BRONCHIAL LAVAGE (N/A)  Total Length of Stay:  LOS: 18 days   Subjective:  Extubated yesterday.  Patient states doing okay.  Flexiseal in place  Objective: Vital signs in last 24 hours: Temp:  [98.2 F (36.8 C)-99.2 F (37.3 C)] 98.3 F (36.8 C) (02/06 0819) Pulse Rate:  [72-110] 84 (02/06 0700) Cardiac Rhythm: Sinus tachycardia (02/06 0400) Resp:  [10-25] 13 (02/06 0700) BP: (125-178)/(73-131) 158/89 (02/06 0700) SpO2:  [85 %-100 %] 99 % (02/06 0700) Weight:  [170 lb 3.1 oz (77.2 kg)] 170 lb 3.1 oz (77.2 kg) (02/06 0500)  Filed Weights   12/26/16 0327 12/29/16 0500 12/30/16 0500  Weight: 169 lb 15.6 oz (77.1 kg) 171 lb 8.3 oz (77.8 kg) 170 lb 3.1 oz (77.2 kg)    Weight change: -1 lb 5.2 oz (-0.6 kg)   Intake/Output from previous day: 02/05 0701 - 02/06 0700 In: 3466.7 [P.O.:20; I.V.:2096.7; NG/GT:120; IV Piggyback:930] Out: 8250 [Urine:4710; Stool:650; Chest Tube:60]  Intake/Output this shift: No intake/output data recorded.  Current Meds: Scheduled Meds: . anidulafungin  100 mg Intravenous Q24H  . bisacodyl  10 mg Oral Daily  . budesonide (PULMICORT) nebulizer solution  0.5 mg Nebulization BID  . chlorhexidine gluconate (MEDLINE KIT)  15 mL Mouth Rinse BID  . Chlorhexidine Gluconate Cloth  6 each Topical Daily  . Chlorhexidine Gluconate Cloth  6 each Topical Daily  . enoxaparin (LOVENOX) injection  40 mg Subcutaneous Q24H  . escitalopram  40 mg Oral Daily  . furosemide  20 mg Intravenous Q12H  . gabapentin  300 mg Oral TID  . insulin aspart  0-24 Units Subcutaneous Q4H  . ipratropium  0.5 mg Nebulization Q6H  . ketorolac  15 mg Intravenous Q6H  . lamoTRIgine  100 mg Oral BID  . magnesium sulfate LVP 250-500  ml  6 g Intravenous Once  . mouth rinse  15 mL Mouth Rinse BID  . metronidazole  500 mg Intravenous Q8H  . pantoprazole (PROTONIX) IV  40 mg Intravenous Q24H  . piperacillin-tazobactam (ZOSYN)  IV  3.375 g Intravenous Q8H  . QUEtiapine  100 mg Oral QHS  . senna-docusate  1 tablet Oral QHS  . sodium chloride flush  10-40 mL Intracatheter Q12H  . sodium chloride flush  10-40 mL Intracatheter Q12H  . vancomycin (VANCOCIN) rectal ENEMA  500 mg Rectal Q6H   Continuous Infusions: . Marland KitchenTPN (CLINIMIX-E) Adult 70 mL/hr at 12/30/16 0700  . dextrose 5 % and 0.45% NaCl 10 mL/hr at 12/30/16 0700   PRN Meds:.fentaNYL (SUBLIMAZE) injection, midazolam, midazolam, ondansetron (ZOFRAN) IV, oxyCODONE, potassium chloride (KCL MULTIRUN) 30 mEq in 265 mL IVPB, sodium chloride flush, sodium chloride flush  General appearance: alert, cooperative and no distress Heart: regular rate and rhythm Lungs: diminished breath sounds bibasilar Abdomen: soft, non-tender; bowel sounds normal; no masses,  no organomegaly Wound: clean and dry  Lab Results: CBC: Recent Labs  12/28/16 0500  12/29/16 0430 12/29/16 1553  WBC 9.4  --  8.9  --   HGB 9.5*  < > 10.2* 9.2*  HCT 29.9*  < > 31.6* 27.0*  PLT 409*  --  432*  --   < > = values in this interval not displayed. BMET:  Recent Labs  12/29/16 0430 12/29/16 1553 12/30/16 0400  NA 140 134* 140  K 3.5 2.8* 3.6  CL 101 106 97*  CO2 29  --  32  GLUCOSE 118* 138* 115*  BUN _0 CREATININE 0.44 0.30* 0.44  CALCIUM 8.6*  --  9.0    CMET: Lab Results  Component Value Date   WBC 8.9 12/29/2016   HGB 9.2 (L) 12/29/2016   HCT 27.0 (L) 12/29/2016   PLT 432 (H) 12/29/2016   GLUCOSE 115 (H) 12/30/2016   CHOL 113 12/13/2016   TRIG 127 12/29/2016   HDL 36 (L) 12/13/2016   LDLCALC 52 12/13/2016   ALT 19 12/29/2016   AST 14 (L) 12/29/2016   NA 140 12/30/2016   K 3.6 12/30/2016   CL 97 (L) 12/30/2016   CREATININE 0.44 12/30/2016   BUN 11 12/30/2016   CO2 32  12/30/2016   TSH 0.892 04/17/2014   INR 1.41 12/26/2016   HGBA1C 5.4 12/13/2016      PT/INR: No results for input(s): LABPROT, INR in the last 72 hours. Radiology: Dg Chest Port 1 View  Result Date: 12/30/2016 CLINICAL DATA:  Patient status post the VATs for empyema 12/26/2016. Chest tube in place. EXAM: PORTABLE CHEST 1 VIEW COMPARISON:  Single-view of the chest 12/29/2016 and 12/28/2016. FINDINGS: Endotracheal tube and NG tube have been removed. Support tubes and lines including a left chest tube are otherwise unchanged. There is no pneumothorax. Small amount of pleural fluid along the lateral left chest wall versus pleural thickening are unchanged. Aeration of the left lower lung zone appears mildly improved. Mild right basilar atelectasis is seen. Bilateral rib fractures are noted. IMPRESSION: Improved aeration in the left lower lung zone. Negative for pneumothorax with a chest tube in place. Electronically Signed   By: Inge Rise M.D.   On: 12/30/2016 07:39     Assessment/Plan: S/P Procedure(s) (LRB): LEFT VIDEO ASSISTED THORACOSCOPY (VATS)/DECORTICATION (Left) VIDEO BRONCHOSCOPY WITH BRONCHIAL LAVAGE (N/A)  1. Chest tube- 60 cc output yesterday--- air leak presents when patient takes deep breath, not present during cough... Likely result of sucking air around tube--- can possibly remove one chest tube today 2. Pulm- aggressive pulm toilet, wean oxygen as tolerated, CXR with better aeration,  CCM is following 3. Renal- creatinine WNL, K is 3.6 supplements ordered prn 4. GI- C. Diff, c/b ileus-- on TPN, denies nausea.. Could possibly start clear liquids today 5. Neuro/Psych- H/O Bipolar disorder, anxiety/agitation-- limit narcotics if possible, continue home Lexapro, Lamictal, Seroquel, and Neurotin 6. Dispo- patient stable, continue aggressive pulm toilet, possibly start clear liquids today, possibly remove one chest tube     Harrington, Tanya 12/30/2016 8:29 AM    Chart  reviewed, patient examined, agree with above. Chest still has an air leak so will keep both in today and put to water seal. CXR looks good. Plan swallowing eval by ST and then start diet if possible. She says she is hungry.

## 2016-12-30 NOTE — Progress Notes (Signed)
Pharmacy Antibiotic Note  [name] is a 51 y.o. female admitted on 12/12/2016 with LLL empyema. They are POD 4 for VATS/Decortication. Today is d19 of therapy for LLL abscess; also on PO vanc/IV flagyl for C. Diff and eraxis for candida albicans and dublinensis growing from abscess cultures. WBC stable, creatinine stable (baseline ~0.8); UOP good (2.5 mL/kg/hr) on lasix.  Pharmacy has been consulted for vancomycin dosing for MRSE growing in LLL abscess culture.   Plan: Vancomycin 1g IV q12h  Monitor renal function, clinical status, DOT, vanc trough as needed  Height: 5' 1.5" (156.2 cm) Weight: 170 lb 3.1 oz (77.2 kg) IBW/kg (Calculated) : 48.95  Temp (24hrs), Avg:98.4 F (36.9 C), Min:97.8 F (36.6 C), Max:99.2 F (37.3 C)   Recent Labs Lab 12/26/16 0326 12/26/16 1212 12/27/16 0416  12/28/16 0500 12/28/16 1618 12/29/16 0430 12/29/16 1553 12/30/16 0400  WBC 21.6* 19.6* 13.0*  --  9.4  --  8.9  --   --   CREATININE 0.46 0.48 0.47  < > 0.41* 0.50 0.44 0.30* 0.44  < > = values in this interval not displayed.  Estimated Creatinine Clearance: 79.2 mL/min (by C-G formula based on SCr of 0.44 mg/dL).    Allergies  Allergen Reactions  . Cymbalta [Duloxetine Hcl] Other (See Comments)    Mood changes...mean  . Mucinex [Guaifenesin Er] Other (See Comments)    Possible allergy, per patient (??)  . Nyquil Multi-Symptom [Pseudoeph-Doxylamine-Dm-Apap] Hives  . Paxil [Paroxetine Hcl] Other (See Comments)    Mean and aggresive  . Promethazine Itching  . Tramadol Itching  . Wellbutrin [Bupropion] Other (See Comments)    Rapid heartrate  . Zoloft [Sertraline] Other (See Comments)    Anger    2/6 vanc>> 2/3 eraxis>> 2/2 zosyn>> 1/29 flagyl>> 2/2; resumed 2/3>> 1/30 vanc enema>>  1/19 ceftriaxone > 1/24 1/19 azithromycin > 1/22 1/22 acyclovir > 1/29 1/24 unasyn >>2/2  2/2 bronch washing- few candida albicans, dubliniensis 2/2 LLL lung, fungal cx: candida albicans 2/2  LLL wound cx, deep: few candida albicans, rare MRSE 1/28 c diff +  1/19 legionella: negative 1/19 blood cx: neg 1/20 mrsa pcr: neg 1/20 rvp: neg 1/20 resp cx: abundant strep pneumo 1/20 strep pneumo: positive 1/22 HSV swab: HSV1 positive 1/24 BCx: neg  Thank you for allowing pharmacy to be a part of this patient's care.  Dierdre Harness, Cain Sieve, PharmD Clinical Pharmacy Resident [phone] (Pager) 12/30/2016 2:48 PM

## 2016-12-30 NOTE — Progress Notes (Signed)
Patient ID: [name], female   DOB: 1966/02/22, 51 y.o.   MRN: [id number]  SICU Evening Rounds:  Hypertensive today. Prn Labetalol added. Will resume scheduled oral lopressor since she is taking po now.  Swallowing eval done today and now on Dys I diet with honey thick liquids.  Urine output good.  BMET    Component Value Date/Time   NA 139 12/30/2016 1543   K 4.0 12/30/2016 1543   CL 96 (L) 12/30/2016 1543   CO2 32 12/30/2016 0400   GLUCOSE 125 (H) 12/30/2016 1543   BUN 14 12/30/2016 1543   CREATININE 0.50 12/30/2016 1543   CALCIUM 9.0 12/30/2016 0400   GFRNONAA >60 12/30/2016 0400   GFRAA >60 12/30/2016 0400   CBC    Component Value Date/Time   WBC 8.9 12/29/2016 0430   RBC 3.51 (L) 12/29/2016 0430   HGB 11.9 (L) 12/30/2016 1543   HCT 35.0 (L) 12/30/2016 1543   HCT 39.6 07/25/2016 1201   PLT 432 (H) 12/29/2016 0430   PLT 324 07/25/2016 1201   MCV 90.0 12/29/2016 0430   MCV 90 07/25/2016 1201   MCH 29.1 12/29/2016 0430   MCHC 32.3 12/29/2016 0430   RDW 16.2 (H) 12/29/2016 0430   RDW 14.8 07/25/2016 1201   LYMPHSABS 1.1 12/29/2016 0430   LYMPHSABS 1.4 07/25/2016 1201   MONOABS 0.7 12/29/2016 0430   EOSABS 0.0 12/29/2016 0430   EOSABS 0.2 07/25/2016 1201   BASOSABS 0.0 12/29/2016 0430   BASOSABS 0.1 07/25/2016 1201

## 2016-12-30 NOTE — Progress Notes (Signed)
Name: [name] MRN: [id number] DOB: 15-Apr-1966    ADMISSION DATE:  12/12/2016 CONSULTATION DATE: 1/19  REFERRING MD : Alvino Chapel, MD   CHIEF COMPLAINT: Asthma Exacerbation   BRIEF PATIENT DESCRIPTION: 51 yo female with PMH asthma, COPD, and bipolar disorder.  Presented on 1/19 hypoxic with working diagnosis of acute asthma exacerbation, multilobar Streptococcus pneumoniae PNA with evidence of abscess formation within the left upper lobe with a small left-sided pneumothorax as well as C.Diff. Intubated 1/19-1/27.   1/28 PCCM was called to evaluate patient for increasing oxygen requirement from room air to 5L , worsening tachypnea, and tachycardia. CT abdomen revealed a colonic ileus without evidence of bowel obstruction.  SUBJECTIVE:  Pt up to chair working with SLP.  Reports feeling much better.  Continues to have hoarse voice and moist cough.    VITAL SIGNS: Temp:  [98.2 F (36.8 C)-99.2 F (37.3 C)] 98.3 F (36.8 C) (02/06 0819) Pulse Rate:  [72-110] 84 (02/06 0700) Resp:  [10-25] 13 (02/06 0700) BP: (125-178)/(73-131) 158/89 (02/06 0700) SpO2:  [85 %-100 %] 99 % (02/06 0700) Weight:  [170 lb 3.1 oz (77.2 kg)] 170 lb 3.1 oz (77.2 kg) (02/06 0500)  PHYSICAL EXAMINATION: General:  Ill appearing adult female in NAD HEENT: MM pink/moist, R IJ central line c/d/i PSY: normal mood Neuro: AAOx4, speech clear, MAE CV: s1s2 rrr, no m/r/g, CT in place PULM: even/non-labored, lungs bilaterally diminished [id number], non-tender, bsx4 active  Extremities: warm/dry, generalized 1+ edema  Skin: no rashes or lesions    Recent Labs Lab 12/28/16 0500  12/29/16 0430 12/29/16 1553 12/30/16 0400  NA 141  < > 140 134* 140  K 3.8  < > 3.5 2.8* 3.6  CL 111  < > 101 106 97*  CO2 23  --  29  --  32  BUN 8  < > 9 8 11   CREATININE 0.41*  < > 0.44 0.30* 0.44  GLUCOSE 114*  < > 118* 138* 115*  < > = values in this interval not displayed.  Recent Labs Lab 12/27/16 0416   12/28/16 0500 12/28/16 1618 12/29/16 0430 12/29/16 1553  HGB 10.2*  < > 9.5* 11.2* 10.2* 9.2*  HCT 31.4*  < > 29.9* 33.0* 31.6* 27.0*  WBC 13.0*  --  9.4  --  8.9  --   PLT 440*  --  409*  --  432*  --   < > = values in this interval not displayed. Dg Chest Port 1 View  Result Date: 12/30/2016 CLINICAL DATA:  Patient status post the VATs for empyema 12/26/2016. Chest tube in place. EXAM: PORTABLE CHEST 1 VIEW COMPARISON:  Single-view of the chest 12/29/2016 and 12/28/2016. FINDINGS: Endotracheal tube and NG tube have been removed. Support tubes and lines including a left chest tube are otherwise unchanged. There is no pneumothorax. Small amount of pleural fluid along the lateral left chest wall versus pleural thickening are unchanged. Aeration of the left lower lung zone appears mildly improved. Mild right basilar atelectasis is seen. Bilateral rib fractures are noted. IMPRESSION: Improved aeration in the left lower lung zone. Negative for pneumothorax with a chest tube in place. Electronically Signed   By: Inge Rise M.D.   On: 12/30/2016 07:39   Dg Chest Port 1 View  Result Date: 12/29/2016 CLINICAL DATA:  Intubated patient, left-sided chest tubes. Status post thoracoscopy, decortication, and bronchial lavage 3 days ago EXAM: PORTABLE CHEST 1 VIEW COMPARISON:  Portable chest x-ray of December 28, 2016. FINDINGS:  The lungs are well-expanded. The interstitial markings have improved. There is no focal infiltrate. There is no pneumothorax nor large pleural effusion. The 2 left-sided chest tubes are in stable position. The heart is top-normal in size. The pulmonary vascularity is not engorged. The endotracheal tube tip lies approximately 1.9 cm above the carina. The esophagogastric tube tip projects below the inferior margin of the image. The right internal jugular venous catheter tip projects over the midportion of the SVC. IMPRESSION: Improved appearance of the pulmonary interstitium consistent  with improved aeration and decreased interstitial edema. No pneumothorax or significant pleural effusion. No focal pneumonia. The support tubes are in reasonable position. Electronically Signed   By: David  Martinique M.D.   On: 12/29/2016 07:27   Dg Abd Portable 1v  Result Date: 12/28/2016 CLINICAL DATA:  Abdominal distension EXAM: PORTABLE ABDOMEN - 1 VIEW COMPARISON:  12/26/2016 abdominal radiograph FINDINGS: Enteric tube terminates in the distal stomach near the pylorus. Cholecystectomy clips are seen in the right upper quadrant of the abdomen. No disproportionately dilated small bowel loops. Mild gas in stool throughout the large bowel. No evidence of pneumatosis or pneumoperitoneum. Stable blunting of the costophrenic angles at the lung bases bilaterally. No pathologic soft tissue calcifications. IMPRESSION: 1. Enteric tube terminates in the distal stomach in the region of the pylorus . 2. Nonobstructive bowel gas pattern. Electronically Signed   By: Ilona Sorrel M.D.   On: 12/28/2016 13:15   SIGNIFICANT EVENTS  2/02 - Pt had decortication L / VATS  2/05 - extubated  2/06 - working with SLP for swallowing needs  STUDIES:  CXR 1/19>> Consolidations bilaterally CT head 1/19>> Left occipital lobe probable acute or early subacute infarction. No acute intracranial hemorrhage. 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 There is a small amount of fluid in this structure. Also a 1 cm nodular structure in the left lower lobe with minimal cavitation.  Abd xr 1/27 >> bowel distended with gas CT Chest 2/1 >> Moderate left hydropneumothorax, no change in size of pulmonary abscess in the lingula, interval increase in loculated fluid at the left lung base with scattered air pockets and lower lobe  atelectasis  Cultures: Blood 1/19 >> Negative Respiratory 1/20 >> Strep Pneumo  RVP 1/20 >> Negative  Oral HSV 1/22 >> positive (completed acyclovir)  Blood 1/24 >> Negative  C diff 1/28 >> Antigen & PCR positive   Antibiotics: Azithromycin 1/19 - 1/21 Ceftriaxone 1/19 - 1/23 Acyclovir 1/22 - 1/27 Unasyn 1/24 >> 2/2 Zosyn 2/2 >  Vanco enteral 1/29 > Flagyl IV 2/3 > Eraxis 2/2 >    ASSESSMENT / PLAN:  Acute hypoxic respiratory failure secondary to multilobar Streptococcus pneumonia PNA + Empyema + hydrothorax S/P VATS/Decortication on 2/2 Asthma/COPD Exacerbation, no wheeze on exam 2/5 Plan: Aggressive pulmonary hygiene - IS, mobilize ABX / antifungals as above Continue duonebs Q6 + pulmicort BID CT's per CVTS  Intermittent CXR   Acute Encephalopathy 2/2 sepsis + agitation + anxiety Bipolar disorder Plan: Minimize narcotics / sedating medications  Resume home lexapro, lamictal, seroquel and neurontin when able to take PO's   Cdiff colitis c/b ileus Plan: Continue Vanco enemas, IV flagyl  SLP evaluation for swallowing  Hopeful to d/c TPN soon  CCS following, appreciate  input    Prophylaxis PPI for SUP  Lovenox for DVT    Family : No family at bedside.  Patient updated at bedside on plan of care.    PCCM will be available PRN.  Please call back if new needs arise.     Noe Gens, NP-C [facility] Pgr: [phone] or if no answer [phone] 12/30/2016, 9:14 AM    Attending Note:  I have examined patient, reviewed labs, studies and notes. I have discussed the case with B OIlis, and I agree with the data and plans as amended above.   51 yo woman with COPD / Asthma, pneumococcal PNA + empyema now s/p L VATS decortication. Also with C diff colitis. She was extubated yesterday, looks good up in a chair at bedside. 2 chest tubes still in place. Would continue her current BD's for now. Call us if we can help w adjusting her meds in prep for  discharge.   Time Spent 25 minutes  Baltazar Apo, MD, PhD 12/30/2016, 1:14 PM [facility] [phone] or if no answer [phone]

## 2016-12-30 NOTE — Progress Notes (Addendum)
Nutrition Follow-up  DOCUMENTATION CODES:   Not applicable  INTERVENTION:    Mighty Shake II TID with meals, each supplement provides 480-500 kcals and 20-23 grams of protein   TPN per pharmacy  NEW NUTRITION DIAGNOSIS:   Increased nutrient needs related to acute illness as evidenced by estimated needs, ongoing  GOAL:   Patient will meet greater than or equal to 90% of their needs, progressing  MONITOR:   PO intake, Supplement acceptance, Labs, Weight trends, I & O's, TPN prescription   ASSESSMENT:   51 year old with severe persistent asthma, COPD, hypertension, bipolar. Admitted with acute hypoxic respiratory failure, multilobar Streptococcus pneumonia.     Pt s/p procedures 2/2: VIDEO ASSISTED THORACOSCOPY (VATS)/DECORTICATION (Left) VIDEO BRONCHOSCOPY WITH BRONCHIAL LAVAGE   Pt extubated 2/5. S/p dysphagia treatment this AM. Speech Path rec Dys 1-honey thick liquids. C. Diff colitis and ileus improved. CBG's [id number].  Patient is receiving TPN with Clinimix E 5/15 @ 70 ml/hr.  No lipids (20% IVFE) at this time.  Provides 1193 kcal and 84 grams protein per day.  Meets 70% minimum re-estimated energy needs and 73% minimum re-estimated protein needs.  Diet Order:  Diet regular Room service appropriate? Yes; Fluid consistency: Thin  Skin:  Wound (see comment) (Unstg to lip, incision on chest)  Last BM:  2/5 (flexiseal)  Height:   Ht Readings from Last 1 Encounters:  01/01/17 5' 1.5" (1.562 m)    Weight:   Wt Readings from Last 1 Encounters:  01/05/17 156 lb 8.4 oz (71 kg)    Ideal Body Weight:  54.54 kg  BMI:  Body mass index is 29.1 kg/m.  Re-estimated Nutritional Needs:   Kcal:  1700-1900  Protein:  115-125 gm  Fluid:  per MD  EDUCATION NEEDS:   No education needs identified at this time  Arthur Holms, RD, LDN Pager #: [phone] After-Hours Pager #: [phone]

## 2016-12-31 ENCOUNTER — Inpatient Hospital Stay (HOSPITAL_COMMUNITY): Payer: Medicaid Other

## 2016-12-31 LAB — BASIC METABOLIC PANEL
Anion gap: 10 (ref 5–15)
BUN: 8 mg/dL (ref 6–20)
CO2: 36 mmol/L — ABNORMAL HIGH (ref 22–32)
Calcium: 8.7 mg/dL — ABNORMAL LOW (ref 8.9–10.3)
Chloride: 93 mmol/L — ABNORMAL LOW (ref 101–111)
Creatinine, Ser: 0.44 mg/dL (ref 0.44–1.00)
GFR calc Af Amer: >60 mL/min (ref >60)
GFR calc non Af Amer: >60 mL/min (ref >60)
Glucose, Bld: 138 mg/dL — ABNORMAL HIGH (ref 65–99)
Potassium: 3.4 mmol/L — ABNORMAL LOW (ref 3.5–5.1)
Sodium: 139 mmol/L (ref 135–145)

## 2016-12-31 LAB — POCT I-STAT, CHEM 8
BUN: 10 mg/dL (ref 6–20)
CHLORIDE: 87 mmol/L — AB (ref 101–111)
CREATININE: 0.5 mg/dL (ref 0.44–1.00)
Calcium, Ion: 1.16 mmol/L (ref 1.15–1.40)
Glucose, Bld: 182 mg/dL — ABNORMAL HIGH (ref 65–99)
HCT: 33 % — ABNORMAL LOW (ref 36.0–46.0)
Hemoglobin: 11.2 g/dL — ABNORMAL LOW (ref 12.0–15.0)
POTASSIUM: 3.6 mmol/L (ref 3.5–5.1)
Sodium: 136 mmol/L (ref 135–145)
TCO2: 37 mmol/L (ref 0–100)

## 2016-12-31 LAB — AEROBIC/ANAEROBIC CULTURE W GRAM STAIN (SURGICAL/DEEP WOUND)

## 2016-12-31 LAB — GLUCOSE, CAPILLARY
GLUCOSE-CAPILLARY: 130 mg/dL — AB (ref 65–99)
GLUCOSE-CAPILLARY: 106 mg/dL — AB (ref 65–99)
Glucose-Capillary: 121 mg/dL — ABNORMAL HIGH (ref 65–99)
Glucose-Capillary: 125 mg/dL — ABNORMAL HIGH (ref 65–99)
Glucose-Capillary: 156 mg/dL — ABNORMAL HIGH (ref 65–99)
Glucose-Capillary: 156 mg/dL — ABNORMAL HIGH (ref 65–99)
Glucose-Capillary: 65 mg/dL (ref 65–99)

## 2016-12-31 LAB — ACID FAST SMEAR (AFB, MYCOBACTERIA)
Acid Fast Smear: NEGATIVE
Acid Fast Smear: NEGATIVE

## 2016-12-31 LAB — MAGNESIUM: Magnesium: 1.5 mg/dL — ABNORMAL LOW (ref 1.7–2.4)

## 2016-12-31 MED ORDER — FLUCONAZOLE 200 MG PO TABS
200.0000 mg | ORAL_TABLET | Freq: Every day | ORAL | Status: DC
  Administered 2016-12-31 – 2017-01-02 (×3): 200 mg via ORAL
  Filled 2016-12-31 (×3): qty 1

## 2016-12-31 MED ORDER — SODIUM CHLORIDE 0.9 % IV SOLN
30.0000 meq | Freq: Once | INTRAVENOUS | Status: AC
  Administered 2016-12-31: 30 meq via INTRAVENOUS
  Filled 2016-12-31 (×2): qty 15

## 2016-12-31 MED ORDER — METRONIDAZOLE 500 MG PO TABS
500.0000 mg | ORAL_TABLET | Freq: Three times a day (TID) | ORAL | Status: DC
  Administered 2016-12-31 – 2017-01-01 (×4): 500 mg via ORAL
  Filled 2016-12-31 (×4): qty 1

## 2016-12-31 MED ORDER — ORAL CARE MOUTH RINSE
15.0000 mL | Freq: Two times a day (BID) | OROMUCOSAL | Status: DC
  Administered 2016-12-31 – 2017-01-12 (×10): 15 mL via OROMUCOSAL

## 2016-12-31 MED ORDER — DEXTROSE 50 % IV SOLN
INTRAVENOUS | Status: AC
  Administered 2016-12-31: 25 mL
  Filled 2016-12-31: qty 50

## 2016-12-31 MED ORDER — IPRATROPIUM BROMIDE 0.02 % IN SOLN
0.5000 mg | Freq: Three times a day (TID) | RESPIRATORY_TRACT | Status: DC
  Administered 2017-01-01: 0.5 mg via RESPIRATORY_TRACT
  Filled 2016-12-31 (×4): qty 2.5

## 2016-12-31 NOTE — Progress Notes (Signed)
Hypoglycemic Event  CBG: 65  Treatment: D50 IV 25 mL  Symptoms: None  Follow-up CBG: Time: 2010 CBG Result:106  Possible Reasons for Event: Inadequate meal intake; pt refusing meals (dislikes dys 1 diet), TPN stopped earlier today.  Comments/MD notified:No, will notify if midnight CBG is below 70.    Sherlie Ban

## 2016-12-31 NOTE — Progress Notes (Signed)
[facility] ICU Electrolyte Replacement Protocol  Patient Name: [name] DOB: 04/04/1966 MRN: [id number]  Date of Service  12/31/2016   HPI/Events of Note    Recent Labs Lab 12/25/16 0332  12/27/16 0416  12/28/16 0500  12/29/16 0430 12/29/16 1553 12/30/16 0400 12/30/16 1543 12/31/16 0446  NA 141  < > 141  < > 141  < > 140 134* 140 139 139  K 3.6  < > 3.4*  < > 3.8  < > 3.5 2.8* 3.6 4.0 3.4*  CL 110  < > 115*  < > 111  < > 101 106 97* 96* 93*  CO2 21*  < > 22  --  23  --  29  --  32  --  36*  GLUCOSE 104*  < > 120*  < > 114*  < > 118* 138* 115* 125* 138*  BUN 7  < > 9  < > 8  < > 9 8 11 14 8  CREATININE 0.41*  < > 0.47  < > 0.41*  < > 0.44 0.30* 0.44 0.50 0.44  CALCIUM 8.5*  < > 8.1*  --  8.5*  --  8.6*  --  9.0  --  8.7*  MG 1.6*  --  1.6*  --  1.7  --  1.5*  --  1.5*  --  1.5*  PHOS 2.4*  --  2.3*  --  2.8  --  3.6  --  3.3  --   --   < > = values in this interval not displayed.  Estimated Creatinine Clearance: 79.2 mL/min (by C-G formula based on SCr of 0.44 mg/dL).  Intake/Output      02 /06 0701 - 02/07 0700   I.V. (mL/kg) 1833.7 (23.8)   Other 125   IV Piggyback 1145   Total Intake(mL/kg) 3103.7 (40.2)   Urine (mL/kg/hr) 3840 (2.1)   Stool 500 (0.3)   Chest Tube 70 (0)   Total Output 4410   Net -1306.3        - I/O DETAILED x24h    Total I/O In: 1205 [I.V.:880; Other:125; IV [id number] Out: 2605 [Urine:2075; Stool:500; Chest Tube:30] - I/O THIS SHIFT    ASSESSMENT   eICURN Interventions  Electrolytes replaced using ICU protocol   ASSESSMENT: [place], Mallary Kreger Nicole 12/31/2016, 6:55 AM

## 2016-12-31 NOTE — Progress Notes (Signed)
Patient ID: [name], female   DOB: 07/07/1966, 50 y.o.   MRN: [id number] EVENING ROUNDS NOTE :     [address]       [place] [address]             [phone]                 5 Days Post-Op Procedure(s) (LRB): LEFT VIDEO ASSISTED THORACOSCOPY (VATS)/DECORTICATION (Left) VIDEO BRONCHOSCOPY WITH BRONCHIAL LAVAGE (N/A)  Total Length of Stay:  LOS: 19 days  BP (!) 168/96   Pulse 85   Temp 99.1 F (37.3 C) (Oral)   Resp (!) 23   Ht 5\' 1.5" (1.562 m)   Wt 170 lb 3.1 oz (77.2 kg)   SpO2 97%   BMI 31.64 kg/m   .Intake/Output      02 /06 0701 - 02/07 0700 02/07 0701 - 02/08 0700   P.O.     I.V. (mL/kg) 1913.7 (24.8) 775 (10)   Other 125    NG/GT     IV Piggyback 1145 565   Total Intake(mL/kg) 3183.7 (41.2) 1340 (17.4)   Urine (mL/kg/hr) 3840 (2.1) 2420 (2.6)   Stool 500 (0.3) 50 (0.1)   Chest Tube 70 (0) 0 (0)   Total Output 4410 2470   Net -1226.3 -1130          . dextrose 5 % and 0.45% NaCl 10 mL/hr at 12/31/16 1800     Lab Results  Component Value Date   WBC 8.9 12/29/2016   HGB 11.2 (L) 12/31/2016   HCT 33.0 (L) 12/31/2016   PLT 432 (H) 12/29/2016   GLUCOSE 182 (H) 12/31/2016   CHOL 113 12/13/2016   TRIG 127 12/29/2016   HDL 36 (L) 12/13/2016   LDLCALC 52 12/13/2016   ALT 19 12/29/2016   AST 14 (L) 12/29/2016   NA 136 12/31/2016   K 3.6 12/31/2016   CL 87 (L) 12/31/2016   CREATININE 0.50 12/31/2016   BUN 10 12/31/2016   CO2 36 (H) 12/31/2016   TSH 0.892 04/17/2014   INR 1.41 12/26/2016   HGBA1C 5.4 12/13/2016   No changes today  Tanya Isaac MD  Beeper [phone] Office [phone] 12/31/2016 6:52 PM

## 2016-12-31 NOTE — Progress Notes (Addendum)
[facility] DAILY ICU PROGRESS NOTE                   [address]            [place] [address]          [phone]   5 Days Post-Op Procedure(s) (LRB): LEFT VIDEO ASSISTED THORACOSCOPY (VATS)/DECORTICATION (Left) VIDEO BRONCHOSCOPY WITH BRONCHIAL LAVAGE (N/A)  Total Length of Stay:  LOS: 19 days   Subjective:  [name] states she is doing okay.  She complains of being tired this morning.  Objective: Vital signs in last 24 hours: Temp:  [97.3 F (36.3 C)-99.2 F (37.3 C)] 98.7 F (37.1 C) (02/07 0400) Pulse Rate:  [84-106] 96 (02/07 0800) Cardiac Rhythm: Normal sinus rhythm (02/07 0400) Resp:  [12-24] 15 (02/07 0800) BP: (139-191)/(76-119) 152/94 (02/07 0800) SpO2:  [91 %-100 %] 91 % (02/07 0800)  Filed Weights   12/26/16 0327 12/29/16 0500 12/30/16 0500  Weight: 169 lb 15.6 oz (77.1 kg) 171 lb 8.3 oz (77.8 kg) 170 lb 3.1 oz (77.2 kg)    Weight change:    Intake/Output from previous day: 02/06 0701 - 02/07 0700 In: 3183.7 [I.V.:1913.7; IV Piggyback:1145] Out: 8250 [Urine:3840; Stool:500; Chest Tube:70]  Intake/Output this shift: Total I/O In: 80 [I.V.:80] Out: -   Current Meds: Scheduled Meds: . anidulafungin  100 mg Intravenous Q24H  . bisacodyl  10 mg Oral Daily  . budesonide (PULMICORT) nebulizer solution  0.5 mg Nebulization BID  . chlorhexidine gluconate (MEDLINE KIT)  15 mL Mouth Rinse BID  . Chlorhexidine Gluconate Cloth  6 each Topical Daily  . Chlorhexidine Gluconate Cloth  6 each Topical Daily  . enoxaparin (LOVENOX) injection  40 mg Subcutaneous Q24H  . escitalopram  40 mg Oral Daily  . furosemide  20 mg Intravenous Q12H  . gabapentin  300 mg Oral TID  . insulin aspart  0-24 Units Subcutaneous Q4H  . ipratropium  0.5 mg Nebulization Q6H  . lamoTRIgine  100 mg Oral BID  . mouth rinse  15 mL Mouth Rinse BID  . metoprolol tartrate  25 mg Oral BID  . metronidazole  500 mg Intravenous Q8H  . pantoprazole (PROTONIX) IV  40 mg  Intravenous Q24H  . piperacillin-tazobactam (ZOSYN)  IV  3.375 g Intravenous Q8H  . potassium chloride (KCL MULTIRUN) 30 mEq in 265 mL IVPB  30 mEq Intravenous Once  . QUEtiapine  100 mg Oral QHS  . senna-docusate  1 tablet Oral QHS  . sodium chloride flush  10-40 mL Intracatheter Q12H  . sodium chloride flush  10-40 mL Intracatheter Q12H  . vancomycin (VANCOCIN) rectal ENEMA  500 mg Rectal Q6H  . vancomycin  1,000 mg Intravenous Q12H   Continuous Infusions: . Marland KitchenTPN (CLINIMIX-E) Adult 70 mL/hr at 12/31/16 0800  . dextrose 5 % and 0.45% NaCl 10 mL/hr at 12/31/16 0800   PRN Meds:.fentaNYL (SUBLIMAZE) injection, [name]'s butt cream, labetalol, ondansetron (ZOFRAN) IV, oxyCODONE, potassium chloride (KCL MULTIRUN) 30 mEq in 265 mL IVPB, sodium chloride flush, sodium chloride flush  General appearance: alert, cooperative and no distress Heart: regular rate and rhythm Lungs: diminished breath sounds bibasilar Abdomen: soft, non-tender; bowel sounds normal; no masses,  no organomegaly Extremities: edema trace-1+ edema Wound: clean and dry  Lab Results: CBC: Recent Labs  12/29/16 0430 12/29/16 1553 12/30/16 1543  WBC 8.9  --   --   HGB 10.2* 9.2* 11.9*  HCT 31.6* 27.0* 35.0*  PLT 432*  --   --  BMET:  Recent Labs  12/30/16 0400 12/30/16 1543 12/31/16 0446  NA 140 139 139  K 3.6 4.0 3.4*  CL 97* 96* 93*  CO2 32  --  36*  GLUCOSE 115* 125* 138*  BUN _0 CREATININE 0.44 0.50 0.44  CALCIUM 9.0  --  8.7*    CMET: Lab Results  Component Value Date   WBC 8.9 12/29/2016   HGB 11.9 (L) 12/30/2016   HCT 35.0 (L) 12/30/2016   PLT 432 (H) 12/29/2016   GLUCOSE 138 (H) 12/31/2016   CHOL 113 12/13/2016   TRIG 127 12/29/2016   HDL 36 (L) 12/13/2016   LDLCALC 52 12/13/2016   ALT 19 12/29/2016   AST 14 (L) 12/29/2016   NA 139 12/31/2016   K 3.4 (L) 12/31/2016   CL 93 (L) 12/31/2016   CREATININE 0.44 12/31/2016   BUN 8 12/31/2016   CO2 36 (H) 12/31/2016   TSH 0.892  04/17/2014   INR 1.41 12/26/2016   HGBA1C 5.4 12/13/2016      PT/INR: No results for input(s): LABPROT, INR in the last 72 hours. Radiology: No results found.   Assessment/Plan: S/P Procedure(s) (LRB): LEFT VIDEO ASSISTED THORACOSCOPY (VATS)/DECORTICATION (Left) VIDEO BRONCHOSCOPY WITH BRONCHIAL LAVAGE (N/A)  1. Empyema- chest tube with 70 cc output yesterday, + air leak with cough- leave in place today 2. Pulm- weaning oxygen as tolerated, continue aggressive pulm toilet 3. Renal- creatinine WNL, + LE edema weight is up about 5 lbs, will give IV Lasix today 4. G- C. Diff on Flagyl, vanco enemas 5. ID- OR cultures growing staph epidermidis on Vancomycin per pharmacy... Fungus culture is also growing candida albicans.... Likely need to start antifungal agent 6. Dysphagia- continue dysphagia 1 diet with thickened liquids.... Will progress diet as SLP evals allow 7. Dispo- patient stable, leave chest tubes due to air leak, continue Vanc, Flagyl will convert to PO, may be able to d/c Zosyn, fungal culture is growing candida albicans...will discuss starting antifungals with staff, continue current care     BARRETT, ERIN 12/31/2016 8:10 AM  Adjust of antibiotics per ccm I have seen and examined [name] and agree with the above assessment  and plan.  Grace Isaac MD Beeper [phone] Office [phone] 12/31/2016 10:51 AM

## 2017-01-01 ENCOUNTER — Inpatient Hospital Stay (HOSPITAL_COMMUNITY): Payer: Medicaid Other

## 2017-01-01 LAB — GLUCOSE, CAPILLARY
Glucose-Capillary: 101 mg/dL — ABNORMAL HIGH (ref 65–99)
Glucose-Capillary: 111 mg/dL — ABNORMAL HIGH (ref 65–99)
Glucose-Capillary: 115 mg/dL — ABNORMAL HIGH (ref 65–99)
Glucose-Capillary: 119 mg/dL — ABNORMAL HIGH (ref 65–99)
Glucose-Capillary: 123 mg/dL — ABNORMAL HIGH (ref 65–99)
Glucose-Capillary: 83 mg/dL (ref 65–99)
Glucose-Capillary: 97 mg/dL (ref 65–99)

## 2017-01-01 LAB — COMPREHENSIVE METABOLIC PANEL
ALT: 14 U/L (ref 14–54)
AST: 12 U/L — ABNORMAL LOW (ref 15–41)
Albumin: 2.1 g/dL — ABNORMAL LOW (ref 3.5–5.0)
Alkaline Phosphatase: 64 U/L (ref 38–126)
Anion gap: 9 (ref 5–15)
BUN: 6 mg/dL (ref 6–20)
CO2: 36 mmol/L — ABNORMAL HIGH (ref 22–32)
Calcium: 8.8 mg/dL — ABNORMAL LOW (ref 8.9–10.3)
Chloride: 89 mmol/L — ABNORMAL LOW (ref 101–111)
Creatinine, Ser: 0.42 mg/dL — ABNORMAL LOW (ref 0.44–1.00)
GFR calc Af Amer: >60 mL/min (ref >60)
GFR calc non Af Amer: >60 mL/min (ref >60)
Glucose, Bld: 129 mg/dL — ABNORMAL HIGH (ref 65–99)
Potassium: 3.4 mmol/L — ABNORMAL LOW (ref 3.5–5.1)
Sodium: 134 mmol/L — ABNORMAL LOW (ref 135–145)
Total Bilirubin: 0.5 mg/dL (ref 0.3–1.2)
Total Protein: 5.3 g/dL — ABNORMAL LOW (ref 6.5–8.1)

## 2017-01-01 LAB — PHOSPHORUS: Phosphorus: 3 mg/dL (ref 2.5–4.6)

## 2017-01-01 LAB — MAGNESIUM: Magnesium: 1.2 mg/dL — ABNORMAL LOW (ref 1.7–2.4)

## 2017-01-01 MED ORDER — VANCOMYCIN 50 MG/ML ORAL SOLUTION
250.0000 mg | Freq: Four times a day (QID) | ORAL | Status: DC
  Administered 2017-01-01 – 2017-01-05 (×17): 250 mg via ORAL
  Administered 2017-01-05: 125 mg via ORAL
  Administered 2017-01-05 – 2017-01-12 (×27): 250 mg via ORAL
  Filled 2017-01-01 (×53): qty 5

## 2017-01-01 NOTE — Progress Notes (Signed)
Clinical Social Worker met patient at bedside to discuss discharge needs. Patient stated that she does not want SNF placement and would rather go home after discharge. Patient stated that her daughter will be able to give her 24/hr supervision. Patient stated that daughter and grandson does live in home with patient. CSW is signing off at this time.  Rhea Pink, MSW,  [place]

## 2017-01-01 NOTE — Progress Notes (Addendum)
TCTS DAILY ICU PROGRESS NOTE                   [address]            [place] [address]          [phone]   6 Days Post-Op Procedure(s) (LRB): LEFT VIDEO ASSISTED THORACOSCOPY (VATS)/DECORTICATION (Left) VIDEO BRONCHOSCOPY WITH BRONCHIAL LAVAGE (N/A)  Total Length of Stay:  LOS: 20 days   Subjective:  No specific complaints.  Poor oral intake, doesn't like dysphagia diet.  Encouraged patient to eat as tolerated and we will advance diet as SLP evaluations allow. She has no ambulated   Objective: Vital signs in last 24 hours: Temp:  [97.7 F (36.5 C)-99.1 F (37.3 C)] 98.9 F (37.2 C) (02/08 0400) Pulse Rate:  [74-104] 102 (02/08 0800) Cardiac Rhythm: Normal sinus rhythm (02/08 0400) Resp:  [14-34] 19 (02/08 0800) BP: (102-184)/(58-106) 136/93 (02/08 0800) SpO2:  [92 %-100 %] 96 % (02/08 0800) Weight:  [164 lb 10.9 oz (74.7 kg)] 164 lb 10.9 oz (74.7 kg) (02/08 0500)  Filed Weights   12/29/16 0500 12/30/16 0500 01/01/17 0500  Weight: 171 lb 8.3 oz (77.8 kg) 170 lb 3.1 oz (77.2 kg) 164 lb 10.9 oz (74.7 kg)    Weight change:    Intake/Output from previous day: 02/07 0701 - 02/08 0700 In: 1935 [I.V.:905; IV Piggyback:1030] Out: 4130 [Urine:4060; Stool:50; Chest Tube:20]  Intake/Output this shift: No intake/output data recorded.  Current Meds: Scheduled Meds: . bisacodyl  10 mg Oral Daily  . budesonide (PULMICORT) nebulizer solution  0.5 mg Nebulization BID  . Chlorhexidine Gluconate Cloth  6 each Topical Daily  . Chlorhexidine Gluconate Cloth  6 each Topical Daily  . enoxaparin (LOVENOX) injection  40 mg Subcutaneous Q24H  . escitalopram  40 mg Oral Daily  . fluconazole  200 mg Oral Daily  . gabapentin  300 mg Oral TID  . insulin aspart  0-24 Units Subcutaneous Q4H  . ipratropium  0.5 mg Nebulization TID  . lamoTRIgine  100 mg Oral BID  . mouth rinse  15 mL Mouth Rinse BID  . metoprolol tartrate  25 mg Oral BID  . metroNIDAZOLE  500 mg Oral  Q8H  . pantoprazole (PROTONIX) IV  40 mg Intravenous Q24H  . QUEtiapine  100 mg Oral QHS  . senna-docusate  1 tablet Oral QHS  . sodium chloride flush  10-40 mL Intracatheter Q12H  . sodium chloride flush  10-40 mL Intracatheter Q12H  . vancomycin  1,000 mg Intravenous Q12H   Continuous Infusions: . dextrose 5 % and 0.45% NaCl 10 mL/hr at 01/01/17 0700   PRN Meds:.Kashmir Leedy's butt cream, labetalol, ondansetron (ZOFRAN) IV, oxyCODONE, potassium chloride (KCL MULTIRUN) 30 mEq in 265 mL IVPB, sodium chloride flush, sodium chloride flush  General appearance: alert, cooperative and no distress Heart: regular rate and rhythm Lungs: diminished breath sounds bibasilar Abdomen: soft, non-tender; bowel sounds normal; no masses,  no organomegaly Extremities: edema minimal Wound: clean and dry  Lab Results: CBC: Recent Labs  12/30/16 1543 12/31/16 1613  HGB 11.9* 11.2*  HCT 35.0* 33.0*   BMET:  Recent Labs  12/31/16 0446 12/31/16 1613 01/01/17 0400  NA 139 136 134*  K 3.4* 3.6 3.4*  CL 93* 87* 89*  CO2 36*  --  36*  GLUCOSE 138* 182* 129*  BUN _0 CREATININE 0.44 0.50 0.42*  CALCIUM 8.7*  --  8.8*    CMET: Lab Results  Component Value Date   WBC 8.9 12/29/2016   HGB 11.2 (L) 12/31/2016   HCT 33.0 (L) 12/31/2016   PLT 432 (H) 12/29/2016   GLUCOSE 129 (H) 01/01/2017   CHOL 113 12/13/2016   TRIG 127 12/29/2016   HDL 36 (L) 12/13/2016   LDLCALC 52 12/13/2016   ALT 14 01/01/2017   AST 12 (L) 01/01/2017   NA 134 (L) 01/01/2017   K 3.4 (L) 01/01/2017   CL 89 (L) 01/01/2017   CREATININE 0.42 (L) 01/01/2017   BUN 6 01/01/2017   CO2 36 (H) 01/01/2017   TSH 0.892 04/17/2014   INR 1.41 12/26/2016   HGBA1C 5.4 12/13/2016      PT/INR: No results for input(s): LABPROT, INR in the last 72 hours. Radiology: No results found.   Assessment/Plan: S/P Procedure(s) (LRB): LEFT VIDEO ASSISTED THORACOSCOPY (VATS)/DECORTICATION (Left) VIDEO BRONCHOSCOPY WITH BRONCHIAL  LAVAGE (N/A)  1. Empyema- chest tube with 20 cc output, continued air leak, leave on water seal... No CXR ordered for today... Will place order 2. Pulm- wean oxygen as tolerated, continue aggressive pulm toilet 3. Renal- creatinine has been okay, weight is trending down, poor oral intake I/O are negative, will d/c Lasix for now and watch volume status 4. CV- Sinus Tach, BP okay.. Likely related to diuresis and poor oral intake will watch 5. GI- C. Diff spoke with CCM will place on oral Vanco 250 mg QID 6. ID- Or cultures grew MRSE on IV Vanc, BAL grew Candida on Diflucan, afebrile 7. Dysphagia- SLP will hopefully evaluate today, continue dysphagia 1 diet for now, encouraged patient to eat as tolerated 7. Dispo- patient stable, leave chest tubes in place with presence of air leak, continue ABX, hold Lasix for now, possibly remove Flexiseal as output has decreased, need to ambulate patient, continue current care     Ellwood Handler 01/01/2017 8:20 AM   Leave chest tube Antibiotics  adjusted for lung abscess and c diff  I have seen and examined Demoni S Harkin and agree with the above assessment  and plan.  Grace Isaac MD Beeper [phone] Office [phone] 01/01/2017 8:36 AM

## 2017-01-01 NOTE — Progress Notes (Signed)
Speech Language Pathology Treatment: Dysphagia  Patient Details Name: [name] MRN: [id number] DOB: 1966-01-21 Today's Date: 01/01/2017 Time: [id number] SLP Time Calculation (min) (ACUTE ONLY): 12 min  Assessment / Plan / Recommendation Clinical Impression  Skilled observation with pos complete. Patient able to self feed honey thick liquids with independent use of small sips. SLP assisted with intake of pureed solids. Both consistencies consumed without overt s/s of aspiration. Vocal quality remains significantly hoarse, to be expected following two prolonged intubations. Patient verbalized decreased po intake with current diet. Recommend repeat MBS to determine potential to advance. Will complete 2/9.    HPI HPI: 51 y/o F, 17 pk year smoker, with Hx of COPD/asthma and bipolar disorder admitted 1/19 with SOB. Working diagnosis of acute asthma exacerbation, multilobar PNA with resultant hypoxic respiratory failure. Intubated 12/12/16 to 12/20/16. Left VATS procedure on 12/26/16. Reintubated 2/2-2/5.      SLP Plan  MBS     Recommendations  Diet recommendations: Honey-thick liquid;Dysphagia 1 (puree) Liquids provided via: Cup;No straw Medication Administration: Whole meds with puree Supervision: Patient able to self feed;Full supervision/cueing for compensatory strategies Compensations: Slow rate;Small sips/bites Postural Changes and/or Swallow Maneuvers: Seated upright 90 degrees                Oral Care Recommendations: Oral care BID Plan: MBS       GO             Gabriel Rainwater MA, CCC-SLP [phone]    [name] 01/01/2017, 10:38 AM

## 2017-01-01 NOTE — Progress Notes (Signed)
Physical Therapy Treatment Patient Details Name: [name] MRN: [id number] DOB: Jan 10, 1966 Today's Date: 01/01/2017    History of Present Illness 51 y/o F, 51 pk year smoker, with Hx of COPD/asthma and bipolar disorder admitted 1/19 with SOB.  Working diagnosis of acute asthma exacerbation, multilobar PNA with resultant hypoxic respiratory failure.  Intubated 12/12/16 to 12/20/16.  Extubated 1/28. Pt transferred to step down on 1/29 with ileus. NG placed which pt pulled out.    PT Comments    Pt progressing toward goals. Pt requires encouragement for participation. Pt requires decreased level of physical assist with all mobility, but continues to require verbal cues for safety due to decreased cognition. Gait limited by flexiseal leaking. Acute PT will continue to follow.   Follow Up Recommendations  SNF;Supervision/Assistance - 24 hour     Equipment Recommendations  None recommended by PT    Recommendations for Other Services       Precautions / Restrictions Precautions Precautions: Fall Restrictions Weight Bearing Restrictions: No    Mobility  Bed Mobility               General bed mobility comments: pt in chair upon arrival  Transfers Overall transfer level: Needs assistance Equipment used: Rolling walker (2 wheeled) Transfers: Sit to/from Stand Sit to Stand: Min assist;+2 safety/equipment         General transfer comment: minA x 1 to power up from chair, stood 3 times v/c for proper use of RW and upright posture  Ambulation/Gait Ambulation/Gait assistance: Min assist;+2 safety/equipment Ambulation Distance (Feet): 70 Feet Assistive device: Rolling walker (2 wheeled) Gait Pattern/deviations: Step-to pattern;Decreased step length - right;Decreased step length - left;Decreased stride length;Trunk flexed;Wide base of support Gait velocity: decreased Gait velocity interpretation: Below normal speed for age/gender General Gait Details: v/c for upright posture  and steering of RW, needs encouragement to continue participation, ambulated 50 feet then seated break then amb 20 feet   Stairs            Wheelchair Mobility    Modified Rankin (Stroke Patients Only)       Balance Overall balance assessment: Needs assistance Sitting-balance support: Feet supported;No upper extremity supported Sitting balance-Leahy Scale: Fair     Standing balance support: Bilateral upper extremity supported Standing balance-Leahy Scale: Poor Standing balance comment: reliant on RW                    Cognition Arousal/Alertness: Awake/alert Behavior During Therapy:  (confused) Overall Cognitive Status: Impaired/Different from baseline Area of Impairment: Orientation;Attention;Memory;Following commands;Safety/judgement;Problem solving Orientation Level: Time;Situation Current Attention Level: Sustained Memory: Decreased short-term memory Following Commands: Follows one step commands with increased time Safety/Judgement: Decreased awareness of safety   Problem Solving: Decreased initiation;Difficulty sequencing;Requires verbal cues;Requires tactile cues General Comments: pt unsure why she is hospitalized and cannot recall after being told by SPT. Pt unaware of living situation PTA    Exercises General Exercises - Lower Extremity Quad Sets: Strengthening;Both;10 reps;Supine Hip ABduction/ADduction: AROM;Strengthening;Both;10 reps;Supine    General Comments General comments (skin integrity, edema, etc.): pt with flexiseal, foley and chest tube      Pertinent Vitals/Pain Pain Assessment: 0-10 Pain Score: 6  Pain Location: stomach, back, chest tube Pain Descriptors / Indicators: Discomfort Pain Intervention(s): Limited activity within patient's tolerance;Monitored during session;Repositioned    Home Living                      Prior Function  PT Goals (current goals can now be found in the care plan section)  Progress towards PT goals: Progressing toward goals    Frequency    Min 3X/week      PT Plan Current plan remains appropriate    Co-evaluation     PT goals addressed during session: Mobility/safety with mobility;Balance;Proper use of DME;Strengthening/ROM       End of Session Equipment Utilized During Treatment: Gait belt;Oxygen Activity Tolerance: Other (comment) (leaking around flexiseal) Patient left: in bed;with call bell/phone within reach;with bed alarm set     Time: [id number] PT Time Calculation (min) (ACUTE ONLY): 40 min  Charges:  $Gait Training: 23-37 mins $Therapeutic Exercise: 8-22 mins                    G Codes:      Tracie Harrier 01/04/2017, 1:31 PM   Tracie Harrier, SPT Acute Rehab SPT [phone]

## 2017-01-01 NOTE — Progress Notes (Signed)
TCTS BRIEF SICU PROGRESS NOTE  6 Days Post-Op  S/P Procedure(s) (LRB): LEFT VIDEO ASSISTED THORACOSCOPY (VATS)/DECORTICATION (Left) VIDEO BRONCHOSCOPY WITH BRONCHIAL LAVAGE (N/A)   Stable day  Plan: Continue current plan  Rexene Alberts, MD 01/01/2017 7:18 PM

## 2017-01-02 ENCOUNTER — Ambulatory Visit: Payer: Self-pay | Admitting: Pulmonary Disease

## 2017-01-02 ENCOUNTER — Inpatient Hospital Stay (HOSPITAL_COMMUNITY): Payer: Medicaid Other

## 2017-01-02 LAB — VANCOMYCIN, TROUGH: Vancomycin Tr: 10 ug/mL — ABNORMAL LOW (ref 15–20)

## 2017-01-02 LAB — GLUCOSE, CAPILLARY
GLUCOSE-CAPILLARY: 100 mg/dL — AB (ref 65–99)
GLUCOSE-CAPILLARY: 115 mg/dL — AB (ref 65–99)
GLUCOSE-CAPILLARY: 123 mg/dL — AB (ref 65–99)
Glucose-Capillary: 107 mg/dL — ABNORMAL HIGH (ref 65–99)
Glucose-Capillary: 109 mg/dL — ABNORMAL HIGH (ref 65–99)

## 2017-01-02 MED ORDER — VANCOMYCIN HCL IN DEXTROSE 1-5 GM/200ML-% IV SOLN
1000.0000 mg | Freq: Three times a day (TID) | INTRAVENOUS | Status: DC
  Administered 2017-01-02 – 2017-01-04 (×8): 1000 mg via INTRAVENOUS
  Filled 2017-01-02 (×9): qty 200

## 2017-01-02 MED ORDER — PANTOPRAZOLE SODIUM 40 MG PO TBEC
40.0000 mg | DELAYED_RELEASE_TABLET | Freq: Every day | ORAL | Status: DC
  Administered 2017-01-02 – 2017-01-12 (×11): 40 mg via ORAL
  Filled 2017-01-02 (×11): qty 1

## 2017-01-02 MED ORDER — INSULIN ASPART 100 UNIT/ML ~~LOC~~ SOLN: 0.0000 [IU] | Freq: Every day | SUBCUTANEOUS | Status: DC

## 2017-01-02 MED ORDER — INSULIN ASPART 100 UNIT/ML ~~LOC~~ SOLN
0.0000 [IU] | Freq: Three times a day (TID) | SUBCUTANEOUS | Status: DC
  Administered 2017-01-03 (×2): 2 [IU] via SUBCUTANEOUS

## 2017-01-02 MED ORDER — IPRATROPIUM-ALBUTEROL 0.5-2.5 (3) MG/3ML IN SOLN
3.0000 mL | Freq: Four times a day (QID) | RESPIRATORY_TRACT | Status: DC | PRN
  Filled 2017-01-02: qty 3

## 2017-01-02 MED ORDER — DIPHENHYDRAMINE HCL 25 MG PO CAPS
25.0000 mg | ORAL_CAPSULE | Freq: Four times a day (QID) | ORAL | Status: DC | PRN
  Administered 2017-01-02: 25 mg via ORAL
  Filled 2017-01-02: qty 1

## 2017-01-02 MED ORDER — FLUCONAZOLE 200 MG PO TABS
400.0000 mg | ORAL_TABLET | Freq: Every day | ORAL | Status: DC
  Administered 2017-01-03 – 2017-01-06 (×4): 400 mg via ORAL
  Filled 2017-01-02 (×5): qty 2

## 2017-01-02 MED ORDER — CLONAZEPAM 0.5 MG PO TABS
0.5000 mg | ORAL_TABLET | Freq: Once | ORAL | Status: AC
  Administered 2017-01-02: 0.5 mg via ORAL
  Filled 2017-01-02: qty 1

## 2017-01-02 NOTE — Progress Notes (Signed)
[facility] Transfer Accept Note  51 yo F with COPD/asthma, Bipolar admitted 1/19 with acute hypoxic respiratory failure to CCM.  Found to have pneumonia and intubated briefly early in hospitalization.  Then found to have C diff, concern for ileus got rectal vancomycin.    CT surgery consulted around 1/30 for persistent hypoxia, CT showing cavitation, empyema, taken to OR on 2/2 for VATS, decortication of LLL, drainage of abscess.  Abscess growing Staph Epi, on vancomycin IV.  Since surgery, was extubated around 2/4, has been stable in ICU since then.  Now on oral vanc for cdiff, stool frequency improving.  On IV vanc for lung abscess.  On oral diflucan for candida in lung BAL.  Got one chest tube out today, CT surgery will follow patient and remove other perhaps later this weekend.  Patient did have demand ischemia early in hospitalization, related to her HFpEF, troponin peaked around 1.2.  CT surgery paused Lasix yesterday and are watching volume status.  To stepdown, TRH to round on 2/10 per [facility].

## 2017-01-02 NOTE — Progress Notes (Signed)
      [address]       [place] [address]             [phone]      PM Rounds  BP (!) 160/89   Pulse 88   Temp 97.9 F (36.6 C) (Oral)   Resp 15   Ht 5' 1.5" (1.562 m)   Wt 154 lb 5.2 oz (70 kg)   SpO2 93%   BMI 28.69 kg/m    Intake/Output Summary (Last 24 hours) at 01/02/17 1812 Last data filed at 01/02/17 1800  Gross per 24 hour  Intake             1300 ml  Output             1530 ml  Net             -230 ml   Air leak still persent  Awaiting step down bed  [employer]. Roxan Hockey, MD [facility] [phone]

## 2017-01-02 NOTE — Plan of Care (Signed)
From XRAY

## 2017-01-02 NOTE — Progress Notes (Signed)
Physical Therapy Treatment Patient Details Name: [name] MRN: [id number] DOB: 08/24/1966 Today's Date: 01/02/2017    History of Present Illness 51 y/o F, 34 pk year smoker, with Hx of COPD/asthma and bipolar disorder admitted 1/19 with SOB.  Working diagnosis of acute asthma exacerbation, multilobar PNA with resultant hypoxic respiratory failure.  Intubated 12/12/16 to 12/20/16.  Extubated 1/28. Pt transferred to step down on 1/29 with ileus. NG placed which pt pulled out.    PT Comments    Pt better oriented during treatment today with A&Ox4, but remains confused about where she lived PTA. Pt requires increased motivation for participation with PT with multiple v/c for correct transfers and amb with RW. Goals updated to match treatment progression. Continues to progress and continues to follow.   Follow Up Recommendations  SNF;Supervision/Assistance - 24 hour     Equipment Recommendations  Rolling walker with 5" wheels    Recommendations for Other Services       Precautions / Restrictions Precautions Precautions: Fall Restrictions Weight Bearing Restrictions: No    Mobility  Bed Mobility Overal bed mobility: Needs Assistance Bed Mobility: Supine to Sit     Supine to sit: Min guard     General bed mobility comments: Required multiple vocal and tactile cues for supine to sit, and increased time.   Transfers Overall transfer level: Needs assistance Equipment used: Rolling walker (2 wheeled) Transfers: Sit to/from Stand Sit to Stand: Min assist         General transfer comment: Min guard x1 for first sit-to-stand to RW, v/c for proper hand placement. Sit-to-stand x2 to RW with minA for rise.   Ambulation/Gait Ambulation/Gait assistance: Min assist;+2 safety/equipment Ambulation Distance (Feet): 90 Feet (+seated rest then 20') Assistive device: Rolling walker (2 wheeled) Gait Pattern/deviations: Step-to pattern;Decreased step length - right;Decreased step length  - left;Decreased stride length;Trunk flexed;Wide base of support Gait velocity: Decreased   General Gait Details: V/c for steering of RW; needed encouragement for participation and continued ambulation. Chair follow for seated rest break.    Stairs            Wheelchair Mobility    Modified Rankin (Stroke Patients Only)       Balance Overall balance assessment: Needs assistance Sitting-balance support: Feet supported;No upper extremity supported Sitting balance-Leahy Scale: Fair     Standing balance support: Bilateral upper extremity supported Standing balance-Leahy Scale: Poor Standing balance comment: Pt requires RW for standing balance.                     Cognition Arousal/Alertness: Awake/alert Behavior During Therapy: WFL for tasks assessed/performed Overall Cognitive Status: Impaired/Different from baseline Area of Impairment: Attention;Memory;Following commands;Safety/judgement;Problem solving   Current Attention Level: Sustained Memory: Decreased short-term memory Following Commands: Follows one step commands with increased time Safety/Judgement: Decreased awareness of safety   Problem Solving: Decreased initiation;Difficulty sequencing;Requires verbal cues;Requires tactile cues General Comments: A&Ox4, but still unaware of where she was living PTA.     Exercises      General Comments General comments (skin integrity, edema, etc.): Pt with foley and chest tube.       Pertinent Vitals/Pain Pain Assessment: 0-10 Pain Score: 10-Worst pain ever Pain Location: Chest tube Pain Intervention(s): Premedicated before session;Limited activity within patient's tolerance    Home Living                      Prior Function  PT Goals (current goals can now be found in the care plan section) Acute Rehab PT Goals Time For Goal Achievement: 01/16/17 Potential to Achieve Goals: Good Progress towards PT goals: Progressing toward goals  (Goals updated as appropriate)    Frequency    Min 3X/week      PT Plan Current plan remains appropriate    Co-evaluation     PT goals addressed during session: Mobility/safety with mobility;Balance;Proper use of DME;Strengthening/ROM       End of Session Equipment Utilized During Treatment: Gait belt;Oxygen Activity Tolerance: Patient limited by pain;Patient limited by fatigue Patient left: in chair;with call bell/phone within reach     Time: 1128-1159 PT Time Calculation (min) (ACUTE ONLY): 31 min  Charges:  $Gait Training: 8-22 mins $Therapeutic Activity: 8-22 mins                    G Codes:      Enis Gash, SPT [phone] Mabeline Caras 01/02/2017, 12:26 PM

## 2017-01-02 NOTE — Plan of Care (Signed)
1250 To xray via wheelchair and O2

## 2017-01-02 NOTE — Progress Notes (Signed)
Pharmacy Antibiotic Note  [name] is a 51 y.o. female admitted on 12/12/2016 with LLL MRSE empyema.  Pharmacy has been consulted for vancomycin dosing.  Vanc trough low, last dose was given late so expect true trough closer to 8.  Plan: Change vanc to 1000mg  IV Q8H for calculated trough ~16.  Height: 5' 1.5" (156.2 cm) Weight: 164 lb 10.9 oz (74.7 kg) IBW/kg (Calculated) : 48.95  Temp (24hrs), Avg:98.4 F (36.9 C), Min:98.2 F (36.8 C), Max:98.9 F (37.2 C)   Recent Labs Lab 12/26/16 1212 12/27/16 0416  12/28/16 0500  12/29/16 0430  12/30/16 0400 12/30/16 1543 12/31/16 0446 12/31/16 1613 01/01/17 0400 01/02/17 0244  WBC 19.6* 13.0*  --  9.4  --  8.9  --   --   --   --   --   --   --   CREATININE 0.48 0.47  < > 0.41*  < > 0.44  < > 0.44 0.50 0.44 0.50 0.42*  --   VANCOTROUGH  --   --   --   --   --   --   --   --   --   --   --   --  10*  < > = values in this interval not displayed.  Estimated Creatinine Clearance: 77.9 mL/min (by C-G formula based on SCr of 0.42 mg/dL (L)).    Allergies  Allergen Reactions  . Cymbalta [Duloxetine Hcl] Other (See Comments)    Mood changes...mean  . Mucinex [Guaifenesin Er] Other (See Comments)    Possible allergy, per patient (??)  . Nyquil Multi-Symptom [Pseudoeph-Doxylamine-Dm-Apap] Hives  . Paxil [Paroxetine Hcl] Other (See Comments)    Mean and aggresive  . Promethazine Itching  . Tramadol Itching  . Wellbutrin [Bupropion] Other (See Comments)    Rapid heartrate  . Zoloft [Sertraline] Other (See Comments)    Anger    Antimicrobials this admission: 2/6 vanc>> 2/3 eraxis>>2/6 2/2 zosyn>>2/7 1/29 flagyl>> 2/2; resumed 2/3>> 1/30 vanc enema>>2/7 Fluconazole PO 2/7>> Vanc PO 2/8>> 1/19 ceftriaxone > 1/24 1/19 azithromycin > 1/22 1/22 acyclovir > 1/29 1/24 unasyn >>2/2  Dose adjustments this admission: Vanc 1g Q12 >> trough 10 >> change vanc 1g Q8  Microbiology results: 2/2 bronch washing- few candida albicans,  dubliniensis 2/2 LLL lung, fungal cx: candida albicans 2/2 LLL wound cx, deep: few candida albicans, rare MRSE 1/28 c diff + 1/19 legionella: negative 1/19 blood cx: neg 1/20 mrsa pcr: neg 1/20 rvp: neg 1/20 resp cx: abundant strep pneumo 1/20 strep pneumo: positive 1/22 HSV swab: HSV1 positive 1/24 BCx: neg  Thank you for allowing pharmacy to be a part of this patient's care.  Wynona Neat, PharmD, BCPS  01/02/2017 3:51 AM

## 2017-01-02 NOTE — Progress Notes (Signed)
7 Days Post-Op Procedure(s) (LRB): LEFT VIDEO ASSISTED THORACOSCOPY (VATS)/DECORTICATION (Left) VIDEO BRONCHOSCOPY WITH BRONCHIAL LAVAGE (N/A) Subjective: MRSA empyema s/p VATS, sitting uo in chair Small air leak with cough only Taking po diet Ready for tx to stepdown- removing [posterior] chest tube today Objective: Vital signs in last 24 hours: Temp:  [98.2 F (36.8 C)-98.6 F (37 C)] 98.2 F (36.8 C) (02/09 0740) Pulse Rate:  [73-105] 91 (02/09 0800) Cardiac Rhythm: Normal sinus rhythm (02/09 0801) Resp:  [15-24] 16 (02/09 0800) BP: (90-159)/(55-141) 130/69 (02/09 0800) SpO2:  [95 %-100 %] 97 % (02/09 0800) Weight:  [154 lb 5.2 oz (70 kg)] 154 lb 5.2 oz (70 kg) (02/09 0600)  Hemodynamic parameters for last 24 hours:  afebrile  Intake/Output from previous day: 02/08 0701 - 02/09 0700 In: 640 [I.V.:240; IV Piggyback:400] Out: 1105 [Urine:1035; Stool:50; Chest Tube:20] Intake/Output this shift: Total I/O In: 200 [IV Piggyback:200] Out: -        Exam    General- alert and comfortable   Lungs- clear without rales, wheezes   Cor- regular rate and rhythm, no murmur , gallop   Abdomen- soft, non-tender   Extremities - warm, non-tender, minimal edema   Neuro- oriented, appropriate, no focal weakness   Lab Results:  Recent Labs  12/30/16 1543 12/31/16 1613  HGB 11.9* 11.2*  HCT 35.0* 33.0*   BMET:  Recent Labs  12/31/16 0446 12/31/16 1613 01/01/17 0400  NA 139 136 134*  K 3.4* 3.6 3.4*  CL 93* 87* 89*  CO2 36*  --  36*  GLUCOSE 138* 182* 129*  BUN 8 10 6   CREATININE 0.44 0.50 0.42*  CALCIUM 8.7*  --  8.8*    PT/INR: No results for input(s): LABPROT, INR in the last 72 hours. ABG    Component Value Date/Time   PHART 7.426 12/29/2016 0500   HCO3 28.2 (H) 12/29/2016 0500   TCO2 37 12/31/2016 1613   ACIDBASEDEF 1.5 12/28/2016 0404   O2SAT 98.4 12/29/2016 0500   CBG (last 3)   Recent Labs  01/01/17 2359 01/02/17 0407 01/02/17 0735  GLUCAP 97  100* 107*    Assessment/Plan: S/P Procedure(s) (LRB): LEFT VIDEO ASSISTED THORACOSCOPY (VATS)/DECORTICATION (Left) VIDEO BRONCHOSCOPY WITH BRONCHIAL LAVAGE (N/A) Cont iv vanco tx to stepdown Leave ant chest tube until air leak resolved  LOS: 21 days    [name] 01/02/2017

## 2017-01-03 ENCOUNTER — Inpatient Hospital Stay (HOSPITAL_COMMUNITY): Payer: Medicaid Other

## 2017-01-03 DIAGNOSIS — B001 Herpesviral vesicular dermatitis: Secondary | ICD-10-CM | POA: Diagnosis present

## 2017-01-03 DIAGNOSIS — Z8249 Family history of ischemic heart disease and other diseases of the circulatory system: Secondary | ICD-10-CM

## 2017-01-03 DIAGNOSIS — J45909 Unspecified asthma, uncomplicated: Secondary | ICD-10-CM

## 2017-01-03 DIAGNOSIS — Z9689 Presence of other specified functional implants: Secondary | ICD-10-CM

## 2017-01-03 DIAGNOSIS — Z823 Family history of stroke: Secondary | ICD-10-CM

## 2017-01-03 DIAGNOSIS — B371 Pulmonary candidiasis: Secondary | ICD-10-CM

## 2017-01-03 DIAGNOSIS — J302 Other seasonal allergic rhinitis: Secondary | ICD-10-CM | POA: Diagnosis present

## 2017-01-03 DIAGNOSIS — D649 Anemia, unspecified: Secondary | ICD-10-CM | POA: Diagnosis present

## 2017-01-03 DIAGNOSIS — Z888 Allergy status to other drugs, medicaments and biological substances status: Secondary | ICD-10-CM

## 2017-01-03 LAB — BASIC METABOLIC PANEL
Anion gap: 11 (ref 5–15)
BUN: <5 mg/dL — ABNORMAL LOW (ref 6–20)
CO2: 33 mmol/L — ABNORMAL HIGH (ref 22–32)
Calcium: 8.8 mg/dL — ABNORMAL LOW (ref 8.9–10.3)
Chloride: 93 mmol/L — ABNORMAL LOW (ref 101–111)
Creatinine, Ser: 0.5 mg/dL (ref 0.44–1.00)
GFR calc Af Amer: >60 mL/min (ref >60)
GFR calc non Af Amer: >60 mL/min (ref >60)
Glucose, Bld: 114 mg/dL — ABNORMAL HIGH (ref 65–99)
Potassium: 3.7 mmol/L (ref 3.5–5.1)
Sodium: 137 mmol/L (ref 135–145)

## 2017-01-03 LAB — CBC
HCT: 31 % — ABNORMAL LOW (ref 36.0–46.0)
Hemoglobin: 9.5 g/dL — ABNORMAL LOW (ref 12.0–15.0)
MCH: 28.4 pg (ref 26.0–34.0)
MCHC: 30.6 g/dL (ref 30.0–36.0)
MCV: 92.5 fL (ref 78.0–100.0)
Platelets: 287 10*3/uL (ref 150–400)
RBC: 3.35 MIL/uL — ABNORMAL LOW (ref 3.87–5.11)
RDW: 15.4 % (ref 11.5–15.5)
WBC: 8.4 10*3/uL (ref 4.0–10.5)

## 2017-01-03 LAB — GLUCOSE, CAPILLARY
GLUCOSE-CAPILLARY: 115 mg/dL — AB (ref 65–99)
GLUCOSE-CAPILLARY: 138 mg/dL — AB (ref 65–99)
Glucose-Capillary: 129 mg/dL — ABNORMAL HIGH (ref 65–99)

## 2017-01-03 MED ORDER — OXYCODONE HCL 5 MG PO TABS
5.0000 mg | ORAL_TABLET | ORAL | Status: DC | PRN
  Administered 2017-01-03 (×3): 10 mg via ORAL
  Administered 2017-01-03: 5 mg via ORAL
  Administered 2017-01-04 – 2017-01-05 (×8): 10 mg via ORAL
  Filled 2017-01-03 (×13): qty 2

## 2017-01-03 MED ORDER — MAGNESIUM SULFATE 2 GM/50ML IV SOLN
2.0000 g | Freq: Once | INTRAVENOUS | Status: AC
  Administered 2017-01-03: 2 g via INTRAVENOUS
  Filled 2017-01-03: qty 50

## 2017-01-03 NOTE — Progress Notes (Signed)
      [address]       [place] [address]             [phone]      Sleeping currently  BP 124/82   Pulse 77   Temp 98.9 F (37.2 C) (Oral)   Resp 12   Ht 5' 1.5" (1.562 m)   Wt 154 lb 5.2 oz (70 kg)   SpO2 97%   BMI 28.69 kg/m    Intake/Output Summary (Last 24 hours) at 01/03/17 1803 Last data filed at 01/03/17 1700  Gross per 24 hour  Intake             1535 ml  Output             2030 ml  Net             -495 ml    Awaiting stepdown bed  Remo Lipps C. Roxan Hockey, MD [facility] [phone]

## 2017-01-03 NOTE — Consult Note (Signed)
[facility]    Date of Admission:  12/12/2016   Day 23 antibiotics        Day 13 IV vancomycin        Day 8 antifungal therapy        Post Op Day 8        Day 4 fluconazole        Day 3 oral vancomycin        Reason for Consult: Left lung pneumonia complicated by abscess and empyema and possible C. difficile colitis Referring Physician: Dr. Gerald Stabs Rama  Principal Problem:   Acute respiratory failure with hypoxia [facility]) Active Problems:   Pneumonia of left upper lobe due to Streptococcus pneumoniae ([place])   Abscess of lower lobe of left lung with pneumonia ([place])   Empyema of pleural space ([place])   Enteritis due to Clostridium difficile   Chronic low back pain   COPD with acute exacerbation (HCC)   IBS (irritable bowel syndrome)   Chronic narcotic dependence ([place])   Cigarette nicotine dependence without complication   MDD (major depressive disorder), recurrent severe, without psychosis ([place])   Mixed hyperlipidemia   Pressure injury of skin   Acute kidney injury ([place])   Transaminitis   Ileus (HCC)   Pneumothorax   Asthma   Seasonal allergies   Normocytic anemia   Herpes labialis   . bisacodyl  10 mg Oral Daily  . budesonide (PULMICORT) nebulizer solution  0.5 mg Nebulization BID  . Chlorhexidine Gluconate Cloth  6 each Topical Daily  . enoxaparin (LOVENOX) injection  40 mg Subcutaneous Q24H  . escitalopram  40 mg Oral Daily  . fluconazole  400 mg Oral Daily  . gabapentin  300 mg Oral TID  . insulin aspart  0-15 Units Subcutaneous TID WC  . insulin aspart  0-5 Units Subcutaneous QHS  . lamoTRIgine  100 mg Oral BID  . mouth rinse  15 mL Mouth Rinse BID  . metoprolol tartrate  25 mg Oral BID  . pantoprazole  40 mg Oral Q1200  . QUEtiapine  100 mg Oral QHS  . senna-docusate  1 tablet Oral QHS  . sodium chloride flush  10-40 mL Intracatheter Q12H  . vancomycin  250 mg Oral Q6H  . vancomycin  1,000 mg Intravenous Q8H     Recommendations: 1. Continue IV vancomycin, fluconazole and oral vancomycin for now.   Assessment: I suspect that the instigating event was severe pneumococcal pneumonia. It appears that she developed superimposed infection with Candida and methicillin-resistant coagulase-negative staph. Although both of these latter pathogens are unusual respiratory pathogens, I would continue to treat with IV vancomycin and oral fluconazole since they were isolated from operative specimens. Both Candida albicans and Candida dublinensis are usually susceptible to fluconazole. I would plan on at least 14 days of therapy postop (another 6 days). She may very well have had symptomatic C. difficile colitis. Since she improved coincident with starting oral vancomycin I would continue it for now. We will follow-up on Monday, 01/05/2017.   HPI: Doristine MOONYEEN LANGELL is a 51 y.o. female with asthma who was admitted to the hospital on 12/12/2016 with left-sided pneumonia and acute respiratory failure. She was started on broad empiric antibiotic therapy. Sputum cultures grew pneumococcus. She developed left lung abscess and empyema and underwent VATS decortication and drainage on 12/26/2016. Lung, abscess and pleural fluid cultures grew Candida albicans, Candida dublinensis and methicillin-resistant coagulase-negative staph. She has been extubated  and is improving slowly. Her anterior chest tube remains in because of a persistent leak. She has had diarrhea and a C. difficile panel done about 2 weeks ago showed that she was C. difficile antigen positive but toxin negative. Because of persistent diarrhea she was started on oral vancomycin 3 days ago. Her diarrhea has improved.  Review of Systems: Review of Systems  Constitutional: Positive for malaise/fatigue and weight loss. Negative for chills, diaphoresis and fever.  HENT: Negative for sore throat.   Respiratory: Positive for cough. Negative for sputum production and shortness  of breath.   Cardiovascular: Positive for chest pain.       Some pain and discomfort related to her chest tube.  Gastrointestinal: Positive for diarrhea. Negative for abdominal pain, heartburn, nausea and vomiting.  Genitourinary: Negative for dysuria and frequency.       Her Foley catheter was just recently removed.  Musculoskeletal: Negative for joint pain and myalgias.  Skin: Negative for rash.  Neurological: Positive for weakness. Negative for dizziness and headaches.  Psychiatric/Behavioral: Positive for depression. Negative for substance abuse. The patient is nervous/anxious.        She has a history of chronic pain and narcotic dependence.    Past Medical History:  Diagnosis Date  . Allergic rhinitis   . Arthritis   . Asthma   . Back pain   . Bipolar 1 disorder ([place])   . Chronic low back pain 03/06/2014  . COPD (chronic obstructive pulmonary disease) ([place])   . Depression   . Fibroid   . Hypertension   . IBS (irritable bowel syndrome)   . Migraine   . Scoliosis     Social History  Substance Use Topics  . Smoking status: Current Every Day Smoker    Packs/day: 0.50    Types: Cigarettes  . Smokeless tobacco: Never Used  . Alcohol use No    Family History  Problem Relation Age of Onset  . Stroke Mother   . Hypertension Brother   . Hypertension Brother   . Hypertension Brother   . Allergic rhinitis Neg Hx   . Angioedema Neg Hx   . Asthma Neg Hx   . Atopy Neg Hx   . Eczema Neg Hx   . Immunodeficiency Neg Hx   . Urticaria Neg Hx    Allergies  Allergen Reactions  . Cymbalta [Duloxetine Hcl] Other (See Comments)    Mood changes...mean  . Mucinex [Guaifenesin Er] Other (See Comments)    Possible allergy, per patient (??)  . Nyquil Multi-Symptom [Pseudoeph-Doxylamine-Dm-Apap] Hives  . Paxil [Paroxetine Hcl] Other (See Comments)    Mean and aggresive  . Promethazine Itching  . Tramadol Itching  . Wellbutrin [Bupropion] Other (See Comments)    Rapid heartrate   . Zoloft [Sertraline] Other (See Comments)    Anger    OBJECTIVE: Blood pressure 134/88, pulse 82, temperature 97.9 F (36.6 C), temperature source Oral, resp. rate (!) 24, height 5' 1.5" (1.562 m), weight 154 lb 5.2 oz (70 kg), SpO2 95 %.  Physical Exam  Constitutional: She is oriented to person, place, and time.  She is alert, pleasant and in no distress sitting up in a chair.  HENT:  Mouth/Throat: No oropharyngeal exudate.  Eyes: Conjunctivae are normal.  Cardiovascular: Normal rate and regular rhythm.   No murmur heard. Pulmonary/Chest: Effort normal.  Faint, scattered crackles and wheezes. 1 left-sided chest tube.  Abdominal: Soft. There is no tenderness.  Musculoskeletal: Normal range of motion. She exhibits no edema  or tenderness.  Neurological: She is alert and oriented to person, place, and time.  Skin: No rash noted.  Few scattered petechiae and bruises.  Psychiatric: Mood and affect normal.    Lab Results Lab Results  Component Value Date   WBC 8.4 01/03/2017   HGB 9.5 (L) 01/03/2017   HCT 31.0 (L) 01/03/2017   MCV 92.5 01/03/2017   PLT 287 01/03/2017    Lab Results  Component Value Date   CREATININE 0.50 01/03/2017   BUN <5 (L) 01/03/2017   NA 137 01/03/2017   K 3.7 01/03/2017   CL 93 (L) 01/03/2017   CO2 33 (H) 01/03/2017    Lab Results  Component Value Date   ALT 14 01/01/2017   AST 12 (L) 01/01/2017   ALKPHOS 64 01/01/2017   BILITOT 0.5 01/01/2017     Microbiology: Recent Results (from the past 240 hour(s))  Fungus Culture With Stain     Status: None (Preliminary result)   Collection Time: 12/26/16  2:32 PM  Result Value Ref Range Status   Fungus Stain Final report  Final    Comment: (NOTE) Performed At: [facility] [place], [place] [id number] Lindon Romp MD [id number]    Fungus (Mycology) Culture PENDING  Incomplete   Fungal Source FLUID  Final  Aerobic/Anaerobic Culture (surgical/deep wound)      Status: None   Collection Time: 12/26/16  2:32 PM  Result Value Ref Range Status   Specimen Description FLUID  Final   Special Requests PLEURAL, LEFT LUNG  Final   Gram Stain   Final    RARE WBC PRESENT, PREDOMINANTLY PMN NO ORGANISMS SEEN    Culture   Final    RARE CANDIDA ALBICANS RARE CANDIDA DUBLINIENSIS NO ANAEROBES ISOLATED    Report Status 12/31/2016 FINAL  Final  Acid Fast Smear (AFB)     Status: None   Collection Time: 12/26/16  2:32 PM  Result Value Ref Range Status   AFB Specimen Processing Concentration  Final   Acid Fast Smear Negative  Final    Comment: (NOTE) Performed At: [facility] [place], [place] [id number] Lindon Romp MD [id number]    Source (AFB) FLUID  Final  Fungus Culture Result     Status: None   Collection Time: 12/26/16  2:32 PM  Result Value Ref Range Status   Result 1 Comment  Final    Comment: (NOTE) KOH/Calcofluor preparation:  no fungus observed. Performed At: [facility] [address] [place], [place] [id number] Lindon Romp MD [id number]   Culture, fungus without smear     Status: Abnormal (Preliminary result)   Collection Time: 12/26/16  2:53 PM  Result Value Ref Range Status   Specimen Description LUNG  Final   Special Requests LEFT LOWER LUNG  Final   Culture CANDIDA ALBICANS (A)  Final   Report Status PENDING  Incomplete  Aerobic/Anaerobic Culture (surgical/deep wound)     Status: None   Collection Time: 12/26/16  2:53 PM  Result Value Ref Range Status   Specimen Description LUNG  Final   Special Requests LEFT LOWER LUNG  Final   Gram Stain   Final    ABUNDANT WBC PRESENT, PREDOMINANTLY PMN MODERATE GRAM POSITIVE COCCI IN PAIRS MODERATE YEAST MODERATE GRAM POSITIVE RODS    Culture   Final    FEW CANDIDA ALBICANS RARE STAPHYLOCOCCUS EPIDERMIDIS NO ANAEROBES ISOLATED    Report Status 12/31/2016 FINAL  Final   Organism ID,  Bacteria STAPHYLOCOCCUS EPIDERMIDIS  Final       Susceptibility   Staphylococcus epidermidis - MIC*    CIPROFLOXACIN >=8 RESISTANT Resistant     ERYTHROMYCIN >=8 RESISTANT Resistant     GENTAMICIN <=0.5 SENSITIVE Sensitive     OXACILLIN >=4 RESISTANT Resistant     TETRACYCLINE <=1 SENSITIVE Sensitive     VANCOMYCIN 2 SENSITIVE Sensitive     TRIMETH/SULFA 80 RESISTANT Resistant     CLINDAMYCIN >=8 RESISTANT Resistant     RIFAMPIN <=0.5 SENSITIVE Sensitive     Inducible Clindamycin NEGATIVE Sensitive     * RARE STAPHYLOCOCCUS EPIDERMIDIS  Acid Fast Smear (AFB)     Status: None   Collection Time: 12/26/16  2:53 PM  Result Value Ref Range Status   AFB Specimen Processing Concentration  Final   Acid Fast Smear Negative  Final    Comment: (NOTE) Performed At: [facility] [address] [place], [place] [id number] Lindon Romp MD [id number]    Source (AFB) LEFT LOWER LUNG  Final  Aerobic/Anaerobic Culture (surgical/deep wound)     Status: None   Collection Time: 12/26/16  4:25 PM  Result Value Ref Range Status   Specimen Description BRONCHIAL WASHINGS  Final   Special Requests LEFT LUNG  Final   Gram Stain   Final    ABUNDANT WBC PRESENT, PREDOMINANTLY PMN RARE YEAST    Culture   Final    FEW CANDIDA ALBICANS FEW CANDIDA DUBLINIENSIS NO ANAEROBES ISOLATED    Report Status 12/31/2016 FINAL  Final    Michel Bickers, MD [facility] [phone] pager   [phone] cell 01/03/2017, 3:11 PM

## 2017-01-03 NOTE — Progress Notes (Signed)
8 Days Post-Op Procedure(s) (LRB): LEFT VIDEO ASSISTED THORACOSCOPY (VATS)/DECORTICATION (Left) VIDEO BRONCHOSCOPY WITH BRONCHIAL LAVAGE (N/A) Subjective: C/o pain, taking 15 mg oxycodone TID at home, only getting 5 mg PRn here  Objective: Vital signs in last 24 hours: Temp:  [97.7 F (36.5 C)-98.4 F (36.9 C)] 98 F (36.7 C) (02/10 0400) Pulse Rate:  [77-109] 109 (02/10 0700) Cardiac Rhythm: Normal sinus rhythm (02/10 0400) Resp:  [14-25] 20 (02/10 0700) BP: (98-165)/(61-104) 143/85 (02/10 0700) SpO2:  [90 %-100 %] 90 % (02/10 0700)  Hemodynamic parameters for last 24 hours:    Intake/Output from previous day: 02/09 0701 - 02/10 0700 In: 2405 [P.O.:1320; I.V.:220; IV Piggyback:865] Out: 2205 [Urine:2185; Chest Tube:20] Intake/Output this shift: No intake/output data recorded.  General appearance: alert, cooperative and no distress Neurologic: hoarse voice, o/w intact Heart: regular rate and rhythm Lungs: diminished breath sounds bibasilar + air leak  Lab Results:  Recent Labs  12/31/16 1613 01/03/17 0345  WBC  --  8.4  HGB 11.2* 9.5*  HCT 33.0* 31.0*  PLT  --  287   BMET:  Recent Labs  01/01/17 0400 01/03/17 0345  NA 134* 137  K 3.4* 3.7  CL 89* 93*  CO2 36* 33*  GLUCOSE 129* 114*  BUN 6 <5*  CREATININE 0.42* 0.50  CALCIUM 8.8* 8.8*    PT/INR: No results for input(s): LABPROT, INR in the last 72 hours. ABG    Component Value Date/Time   PHART 7.426 12/29/2016 0500   HCO3 28.2 (H) 12/29/2016 0500   TCO2 37 12/31/2016 1613   ACIDBASEDEF 1.5 12/28/2016 0404   O2SAT 98.4 12/29/2016 0500   CBG (last 3)   Recent Labs  01/02/17 1136 01/02/17 1653 01/02/17 2025  GLUCAP 115* 109* 123*    Assessment/Plan: S/P Procedure(s) (LRB): LEFT VIDEO ASSISTED THORACOSCOPY (VATS)/DECORTICATION (Left) VIDEO BRONCHOSCOPY WITH BRONCHIAL LAVAGE (N/A) -On IV vanco for MRSA pneumonia/ empyema Still has a small air leak- keep CT inplace Mobilize Awaiting  transfer to step down bed   LOS: 22 days    Melrose Nakayama 01/03/2017

## 2017-01-03 NOTE — Progress Notes (Signed)
Progress Note    [name]  BJS:[id number] DOB: Oct 31, 1966  DOA: 12/12/2016 PCP: [name], MD    Brief Narrative:   Chief complaint: Follow-up pneumonia/empyema/C. difficile colitis  [name] is an 51 y.o. female with a PMH of 45 pack year history of tobacco abuse, asthma, allergic rhinitis, COPD, hypertension and bipolar disorder who was admitted by the critical care team 12/12/16 with acute hypoxic respiratory failure, asthma exacerbation and multilobar pneumonia. She was also encephalopathic on admission. Her hospital course has been complicated by the development of C. difficile colitis, Streptococcus pneumonia lung abscess/empyema status post VATS on 12/26/16, ileus, pneumothorax, VDRF requiring intubation (intubated 12/12/16-12/21/16, reintubated 12/26/16-12/29/16), and demand ischemia. Patient transferred to the care of [facility] on 01/03/17.  Culture data is as follows: 1/19 legionella: negative 1/19 blood cx: neg 1/20 mrsa pcr: neg 1/20 rvp: neg 1/20 resp cx: abundant strep pneumo 1/20 strep pneumo: positive 1/22 HSV swab: HSV1 positive 1/24 BCx: neg 1/28 c diff + 2/2 bronch washing- few candida albicans, dubliniensis 2/2 LLL lung, fungal cx: candida albicans 2/2 LLL wound cx, deep: few candida albicans, rare MRSE  Assessment/Plan:   Principal Problem:   Acute respiratory failure with hypoxia ([place]), Multifactorial Required intubation 12/12/16-12/21/16 with reintubation 12/26/16 through 12/29/16. Continue to wean oxygen. Continue aggressive pulmonary toilet. Continue duo nebs every 6 hours and Pulmicort twice a day.    Pneumonia with cavity of lung/Abscess of lower lobe of left lung with pneumonia (HCC)/Empyema of pleural space (HCC)/Pneumonia of left upper lobe due to Streptococcus pneumoniae (HCC)/HCAP/pneumothorax/sepsis Initial respiratory cultures grew strep pneumonia with a positive strep pneumonia antigen. Status post VATS 12/26/16. Residual chest tube in place with  persistent air leak. OR cultures + staph epidermidis. BAL cultures + for Candida. Continue vancomycin for MRSE, and Diflucan for Candida. We'll consult with ID to determine appropriate antibiotic course. Discussed case with Dr. Megan Salon who will see the patient in consultation.  Active Problems:   Acute encephalopathy secondary to sepsis/bipolar disorder Continue to minimize narcotics. Home medications including Neurontin, Klonopin, Lexapro, Lamictal, Seroquel have been resumed. Patient currently awake and alert, requesting an increased dose of her opiates. Rationale for limiting opiate use to improve her pulmonary hygiene was fully explained.    Dysphagia/ileus Initially managed with TPN given ileus. Being followed by speech therapist. Modified barium swallow done 01/02/17. Diet subsequently advanced to regular with thin liquids.    Pressure injury of skin Skin care per nursing.    Acute kidney injury ([place]) Resolved.    Hypertension Continue scheduled Lopressor and when necessary labetalol.    Transaminitis Resolved.    Enteritis due to Clostridium difficile/abdominal distention Initially treated with vancomycin enemas and IV Flagyl. Continue oral vancomycin. Flexiseal in place.    Physical deconditioning Physical therapy following. SNF recommended but patient is declining at present. Says she has 24-hour care available.    Normocytic anemia Likely anemia of chronic disease/inflammation. Hemoglobin stable.    Hypomagnesemia We'll give 2 g of magnesium today and recheck levels in the morning.   Family Communication/Anticipated D/C date and plan/Code Status   DVT prophylaxis: Lovenox ordered. Code Status: Full Code.  Family Communication: No family present at the bedside. Disposition Plan: Once to be discharged home although PT recommends SNF.   Medical Consultants:    CVTS  PCCM  Neurology  ID   Procedures:    VATS2/2/18  Anti-Infectives:   2/6 vanc>> 2/3  eraxis>>2/6 2/2 zosyn>>2/7 1/29 flagyl>> 2/2; resumed 2/3>> 1/30 vanc  enema>>2/7 Fluconazole PO 2/7>> Vanc PO 2/8>> 1/19 ceftriaxone > 1/24 1/19 azithromycin > 1/22 1/22 acyclovir > 1/29 1/24 unasyn >>2/2  Subjective:   Patient reports that she is having pain at the chest tube insertion site as well as abdominal pain. Requesting higher doses of oxycodone. No nausea or vomiting. Occasional shortness of breath.  Objective:    Vitals:   01/03/17 0400 01/03/17 0500 01/03/17 0600 01/03/17 0700  BP: (!) 142/84 130/72 126/72 (!) 143/85  Pulse: 94 81 81 (!) 109  Resp: 17 (!) 23 (!) 23 20  Temp: 98 F (36.7 C)     TempSrc: Oral     SpO2: 94% 96% 95% 90%  Weight:      Height:        Intake/Output Summary (Last 24 hours) at 01/03/17 0719 Last data filed at 01/03/17 0600  Gross per 24 hour  Intake             2395 ml  Output             2205 ml  Net              190 ml   Filed Weights   12/30/16 0500 01/01/17 0500 01/02/17 0600  Weight: 77.2 kg (170 lb 3.1 oz) 74.7 kg (164 lb 10.9 oz) 70 kg (154 lb 5.2 oz)    Exam: General exam: Appears older than her stated age, sitting up in the chair. Respiratory system: Bilateral rhonchi with tachypnea. Chest tube left flank stop Cardiovascular system: Tachycardic. No JVD,  rubs, gallops or clicks. No murmurs. Gastrointestinal system: Abdomen is nondistended, soft and nontender. No organomegaly or masses felt. Normal bowel sounds heard. Central nervous system: Alert and oriented x 2. No focal neurological deficits. Extremities: No clubbing,  or cyanosis. No edema. Skin: No rashes, lesions or ulcers. Psychiatry: Judgement and insight appear impaired. Mood & affect irritable.   Data Reviewed:   I have personally reviewed following labs and imaging studies:  Labs: Basic Metabolic Panel:  Recent Labs Lab 12/28/16 0500  12/29/16 0430  12/30/16 0400 12/30/16 1543 12/31/16 0446 12/31/16 1613 01/01/17 0400 01/03/17 0345  NA 141   < > 140  < > 140 139 139 136 134* 137  K 3.8  < > 3.5  < > 3.6 4.0 3.4* 3.6 3.4* 3.7  CL 111  < > 101  < > 97* 96* 93* 87* 89* 93*  CO2 23  --  29  --  32  --  36*  --  36* 33*  GLUCOSE 114*  < > 118*  < > 115* 125* 138* 182* 129* 114*  BUN 8  < > 9  < > _0 <5*  CREATININE 0.41*  < > 0.44  < > 0.44 0.50 0.44 0.50 0.42* 0.50  CALCIUM 8.5*  --  8.6*  --  9.0  --  8.7*  --  8.8* 8.8*  MG 1.7  --  1.5*  --  1.5*  --  1.5*  --  1.2*  --   PHOS 2.8  --  3.6  --  3.3  --   --   --  3.0  --   < > = values in this interval not displayed. GFR Estimated Creatinine Clearance: 75.4 mL/min (by C-G formula based on SCr of 0.5 mg/dL). Liver Function Tests:  Recent Labs Lab 12/28/16 0500 12/29/16 0430 01/01/17 0400  AST 12* 14* 12*  ALT _1 ALKPHOS  46 54 64  BILITOT 0.3 0.4 0.5  PROT 4.6* 5.1* 5.3*  ALBUMIN 1.8* 2.1* 2.1*   No results for input(s): LIPASE, AMYLASE in the last 168 hours. No results for input(s): AMMONIA in the last 168 hours. Coagulation profile No results for input(s): INR, PROTIME in the last 168 hours.  CBC:  Recent Labs Lab 12/28/16 0500  12/29/16 0430 12/29/16 1553 12/30/16 1543 12/31/16 1613 01/03/17 0345  WBC 9.4  --  8.9  --   --   --  8.4  NEUTROABS 7.3  --  7.2  --   --   --   --   HGB 9.5*  < > 10.2* 9.2* 11.9* 11.2* 9.5*  HCT 29.9*  < > 31.6* 27.0* 35.0* 33.0* 31.0*  MCV 90.9  --  90.0  --   --   --  92.5  PLT 409*  --  432*  --   --   --  287  < > = values in this interval not displayed. Cardiac Enzymes: No results for input(s): CKTOTAL, CKMB, CKMBINDEX, TROPONINI in the last 168 hours. BNP (last 3 results) No results for input(s): PROBNP in the last 8760 hours. CBG:  Recent Labs Lab 01/02/17 0407 01/02/17 0735 01/02/17 1136 01/02/17 1653 01/02/17 2025  GLUCAP 100* 107* 115* 109* 123*   D-Dimer: No results for input(s): DDIMER in the last 72 hours. Hgb A1c: No results for input(s): HGBA1C in the last 72 hours. Lipid  Profile: No results for input(s): CHOL, HDL, LDLCALC, TRIG, CHOLHDL, LDLDIRECT in the last 72 hours. Thyroid function studies: No results for input(s): TSH, T4TOTAL, T3FREE, THYROIDAB in the last 72 hours.  Invalid input(s): FREET3 Anemia work up: No results for input(s): VITAMINB12, FOLATE, FERRITIN, TIBC, IRON, RETICCTPCT in the last 72 hours. Sepsis Labs:  Recent Labs Lab 12/28/16 0500 12/29/16 0430 01/03/17 0345  WBC 9.4 8.9 8.4    Microbiology Recent Results (from the past 240 hour(s))  Fungus Culture With Stain     Status: None (Preliminary result)   Collection Time: 12/26/16  2:32 PM  Result Value Ref Range Status   Fungus Stain Final report  Final    Comment: (NOTE) Performed At: [facility] [place], [place] [id number] Lindon Romp MD [id number]    Fungus (Mycology) Culture PENDING  Incomplete   Fungal Source FLUID  Final  Aerobic/Anaerobic Culture (surgical/deep wound)     Status: None   Collection Time: 12/26/16  2:32 PM  Result Value Ref Range Status   Specimen Description FLUID  Final   Special Requests PLEURAL, LEFT LUNG  Final   Gram Stain   Final    RARE WBC PRESENT, PREDOMINANTLY PMN NO ORGANISMS SEEN    Culture   Final    RARE CANDIDA ALBICANS RARE CANDIDA DUBLINIENSIS NO ANAEROBES ISOLATED    Report Status 12/31/2016 FINAL  Final  Acid Fast Smear (AFB)     Status: None   Collection Time: 12/26/16  2:32 PM  Result Value Ref Range Status   AFB Specimen Processing Concentration  Final   Acid Fast Smear Negative  Final    Comment: (NOTE) Performed At: [facility] [place], [place] [id number] Lindon Romp MD [id number]    Source (AFB) FLUID  Final  Fungus Culture Result     Status: None   Collection Time: 12/26/16  2:32 PM  Result Value Ref Range Status   Result 1 Comment  Final    Comment: (NOTE)  KOH/Calcofluor preparation:  no fungus observed. Performed At: [facility] [place], [place] [id number] Lindon Romp MD [id number]   Culture, fungus without smear     Status: Abnormal (Preliminary result)   Collection Time: 12/26/16  2:53 PM  Result Value Ref Range Status   Specimen Description LUNG  Final   Special Requests LEFT LOWER LUNG  Final   Culture CANDIDA ALBICANS (A)  Final   Report Status PENDING  Incomplete  Aerobic/Anaerobic Culture (surgical/deep wound)     Status: None   Collection Time: 12/26/16  2:53 PM  Result Value Ref Range Status   Specimen Description LUNG  Final   Special Requests LEFT LOWER LUNG  Final   Gram Stain   Final    ABUNDANT WBC PRESENT, PREDOMINANTLY PMN MODERATE GRAM POSITIVE COCCI IN PAIRS MODERATE YEAST MODERATE GRAM POSITIVE RODS    Culture   Final    FEW CANDIDA ALBICANS RARE STAPHYLOCOCCUS EPIDERMIDIS NO ANAEROBES ISOLATED    Report Status 12/31/2016 FINAL  Final   Organism ID, Bacteria STAPHYLOCOCCUS EPIDERMIDIS  Final      Susceptibility   Staphylococcus epidermidis - MIC*    CIPROFLOXACIN >=8 RESISTANT Resistant     ERYTHROMYCIN >=8 RESISTANT Resistant     GENTAMICIN <=0.5 SENSITIVE Sensitive     OXACILLIN >=4 RESISTANT Resistant     TETRACYCLINE <=1 SENSITIVE Sensitive     VANCOMYCIN 2 SENSITIVE Sensitive     TRIMETH/SULFA 80 RESISTANT Resistant     CLINDAMYCIN >=8 RESISTANT Resistant     RIFAMPIN <=0.5 SENSITIVE Sensitive     Inducible Clindamycin NEGATIVE Sensitive     * RARE STAPHYLOCOCCUS EPIDERMIDIS  Acid Fast Smear (AFB)     Status: None   Collection Time: 12/26/16  2:53 PM  Result Value Ref Range Status   AFB Specimen Processing Concentration  Final   Acid Fast Smear Negative  Final    Comment: (NOTE) Performed At: [facility] [address] [place], [place] [id number] Lindon Romp MD VX:[id number]    Source (AFB) LEFT LOWER LUNG  Final  Aerobic/Anaerobic Culture (surgical/deep wound)     Status: None   Collection Time: 12/26/16  4:25 PM   Result Value Ref Range Status   Specimen Description BRONCHIAL WASHINGS  Final   Special Requests LEFT LUNG  Final   Gram Stain   Final    ABUNDANT WBC PRESENT, PREDOMINANTLY PMN RARE YEAST    Culture   Final    FEW CANDIDA ALBICANS FEW CANDIDA DUBLINIENSIS NO ANAEROBES ISOLATED    Report Status 12/31/2016 FINAL  Final    Radiology: Dg Chest Port 1 View  Result Date: 01/02/2017 CLINICAL DATA:  Empyema, asthma, hypertension, COPD EXAM: PORTABLE CHEST 1 VIEW COMPARISON:  Portable exam 0545 hours compared 01/01/2017 FINDINGS: LEFT arm PICC line tip projects over SVC. Pair of LEFT thoracostomy tubes present. EKG leads project over chest. Normal heart size, mediastinal contours, and pulmonary vascularity. Persistent small LEFT pleural effusion and basilar atelectasis. Increased atelectasis and blunting of the RIGHT lateral costophrenic angle. Tiny persistent LEFT apical pneumothorax. Upper lungs clear. Healing told lateral lower RIGHT rib fractures. IMPRESSION: Tiny persistent LEFT apical pneumothorax. Persistent mild LEFT pleural effusion and bibasilar atelectasis. Electronically Signed   By: Lavonia Dana M.D.   On: 01/02/2017 08:45   Dg Chest Port 1 View  Result Date: 01/01/2017 CLINICAL DATA:  Empyema, left chest tube EXAM: PORTABLE CHEST 1 VIEW COMPARISON:  12/31/2016 FINDINGS: Support devices remain in stable position.  Left chest tube in place. Small left apical pneumothorax, stable. Mid lung atelectasis on the left. Right lung is clear. Heart is borderline in size. IMPRESSION: Small residual left apical pneumothorax, stable since prior study. Electronically Signed   By: Rolm Baptise M.D.   On: 01/01/2017 09:07   Dg Swallowing Func-speech Pathology  Result Date: 01/02/2017 Objective Swallowing Evaluation: Type of Study: MBS-Modified Barium Swallow Study Patient Details Name: [name] MRN: [id number] Date of Birth: Apr 10, 1966 Today's Date: 01/02/2017 Time: SLP Start Time (ACUTE ONLY):  1415-SLP Stop Time (ACUTE ONLY): 1445 SLP Time Calculation (min) (ACUTE ONLY): 30 min Past Medical History: Past Medical History: Diagnosis Date . Allergic rhinitis  . Arthritis  . Asthma  . Back pain  . Bipolar 1 disorder ([place])  . Chronic low back pain 03/06/2014 . COPD (chronic obstructive pulmonary disease) ([place])  . Depression  . Fibroid  . Hypertension  . IBS (irritable bowel syndrome)  . Migraine  . Scoliosis  Past Surgical History: Past Surgical History: Procedure Laterality Date . CHOLECYSTECTOMY   . DILATION AND CURETTAGE OF UTERUS   . EXPLORATORY LAPAROTOMY    WHEN PATIENT WAS YOUNG . RIGHT FOOT SURGERY   . TUBAL LIGATION Bilateral  . VIDEO ASSISTED THORACOSCOPY (VATS)/DECORTICATION Left 12/26/2016  Procedure: LEFT VIDEO ASSISTED THORACOSCOPY (VATS)/DECORTICATION;  Surgeon: Ivin Poot, MD;  Location: [place];  Service: Thoracic;  Laterality: Left; Marland Kitchen VIDEO BRONCHOSCOPY N/A 12/26/2016  Procedure: VIDEO BRONCHOSCOPY WITH BRONCHIAL LAVAGE;  Surgeon: Ivin Poot, MD;  Location[facility] OR;  Service: Thoracic;  Laterality: N/A; HPI: 51 y/o F, 45 pk year smoker, with Hx of COPD/asthma and bipolar disorder admitted 1/19 with SOB. Working diagnosis of acute asthma exacerbation, multilobar PNA with resultant hypoxic respiratory failure. Intubated 12/12/16 to 12/20/16. Left VATS procedure on 12/26/16. Reintubated 2/2-2/5.  Initial MBS 12/22/16  Subjective: alert for evaluation, requires cues to maintain alertness Assessment / Plan / Recommendation CHL IP CLINICAL IMPRESSIONS 01/02/2017 Therapy Diagnosis Mild oral phase dysphagia;Mild pharyngeal phase dysphagia Clinical Impression Pt demonstrates improved overall swallow function and airway protection.  She continues to demonstrate delayed onset of swallow to the pyriforms with thin liquids, and when consuming large, consecutive boluses, trace aspiration of thins was observed just below vocal folds.  No spontaneous cough response was elicited. However, when limiting boluses  to single sips, there was only occasional high penetration, but no aspiration.  Pharyngeal clearance of all materials was excellent; oral phase WNL.  Given pt's improved mental status, recommend advancing diet to regular, thin liquids; meds whole in puree. She should follow basic precautions to ensure safety.  SLP will follow briefly to facilitate safety/education.  Impact on safety and function Mild aspiration risk   CHL IP TREATMENT RECOMMENDATION 12/22/2016 Treatment Recommendations Therapy as outlined in treatment plan below   Prognosis 12/22/2016 Prognosis for Safe Diet Advancement Good Barriers to Reach Goals -- Barriers/Prognosis Comment -- CHL IP DIET RECOMMENDATION 01/02/2017 SLP Diet Recommendations Regular solids;Thin liquid Liquid Administration via Cup;No straw Medication Administration Whole meds with puree Compensations Slow rate;Small sips/bites Postural Changes --   CHL IP OTHER RECOMMENDATIONS 01/02/2017 Recommended Consults -- Oral Care Recommendations Oral care BID Other Recommendations --   CHL IP FOLLOW UP RECOMMENDATIONS 12/30/2016 Follow up Recommendations Other (comment)   CHL IP FREQUENCY AND DURATION 12/22/2016 Speech Therapy Frequency (ACUTE ONLY) min 2x/week Treatment Duration 2 weeks      CHL IP ORAL PHASE 01/02/2017 Oral Phase WFL Oral - Pudding Teaspoon -- Oral - Pudding Cup --  Oral - Honey Teaspoon -- Oral - Honey Cup -- Oral - Nectar Teaspoon -- Oral - Nectar Cup -- Oral - Nectar Straw -- Oral - Thin Teaspoon -- Oral - Thin Cup -- Oral - Thin Straw -- Oral - Puree -- Oral - Mech Soft -- Oral - Regular -- Oral - Multi-Consistency -- Oral - Pill -- Oral Phase - Comment --  CHL IP PHARYNGEAL PHASE 01/02/2017 Pharyngeal Phase Impaired Pharyngeal- Pudding Teaspoon -- Pharyngeal -- Pharyngeal- Pudding Cup -- Pharyngeal -- Pharyngeal- Honey Teaspoon -- Pharyngeal -- Pharyngeal- Honey Cup NT Pharyngeal -- Pharyngeal- Nectar Teaspoon NT Pharyngeal -- Pharyngeal- Nectar Cup Delayed swallow  initiation-pyriform sinuses Pharyngeal Material does not enter airway Pharyngeal- Nectar Straw -- Pharyngeal -- Pharyngeal- Thin Teaspoon -- Pharyngeal -- Pharyngeal- Thin Cup Delayed swallow initiation-pyriform sinuses;Trace aspiration Pharyngeal -- Pharyngeal- Thin Straw -- Pharyngeal -- Pharyngeal- Puree Delayed swallow initiation-vallecula Pharyngeal -- Pharyngeal- Mechanical Soft -- Pharyngeal -- Pharyngeal- Regular -- Pharyngeal -- Pharyngeal- Multi-consistency -- Pharyngeal -- Pharyngeal- Pill -- Pharyngeal -- Pharyngeal Comment --  CHL IP CERVICAL ESOPHAGEAL PHASE 01/02/2017 Cervical Esophageal Phase WFL Pudding Teaspoon -- Pudding Cup -- Honey Teaspoon -- Honey Cup -- Nectar Teaspoon -- Nectar Cup -- Nectar Straw -- Thin Teaspoon -- Thin Cup -- Thin Straw -- Puree -- Mechanical Soft -- Regular -- Multi-consistency -- Pill -- Cervical Esophageal Comment -- No flowsheet data found. Juan Quam Laurice 01/02/2017, 3:17 PM              CXR 1/19>> Consolidations bilaterally CT head 1/19>> Left occipital lobe probable acute or early subacute infarction. No acute intracranial hemorrhage. 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 There is a small amount of fluid in this structure. Also a 1 cm nodular structure in the left lower lobe with minimal cavitation.  Abd xr 1/27 >> bowel distended with gas CT Chest 2/1 >> Moderate left hydropneumothorax, no change in size of pulmonary abscess in the lingula, interval increase in loculated fluid at the left lung base with scattered air pockets and lower lobe atelectasis  Medications:   . bisacodyl  10 mg Oral Daily  . budesonide (PULMICORT) nebulizer solution  0.5 mg Nebulization BID  . Chlorhexidine Gluconate Cloth  6 each Topical Daily  .  enoxaparin (LOVENOX) injection  40 mg Subcutaneous Q24H  . escitalopram  40 mg Oral Daily  . fluconazole  400 mg Oral Daily  . gabapentin  300 mg Oral TID  . insulin aspart  0-15 Units Subcutaneous TID WC  . insulin aspart  0-5 Units Subcutaneous QHS  . lamoTRIgine  100 mg Oral BID  . mouth rinse  15 mL Mouth Rinse BID  . metoprolol tartrate  25 mg Oral BID  . pantoprazole  40 mg Oral Q1200  . QUEtiapine  100 mg Oral QHS  . senna-docusate  1 tablet Oral QHS  . sodium chloride flush  10-40 mL Intracatheter Q12H  . vancomycin  250 mg Oral Q6H  . vancomycin  1,000 mg Intravenous Q8H   Continuous Infusions: . dextrose 5 % and 0.45% NaCl 10 mL/hr at 01/03/17 0600    Medical decision making is of high complexity and this patient is at high risk of deterioration, therefore this is a level 3 visit.  (>  4 problem points, >4 data points, high risk)    LOS: 22 days   Analiah Drum  [facility] Pager [phone]. If unable to reach me by pager, please call my cell phone at [phone].  *Please refer to [email or web address], password [email or web address] to get updated schedule on who will round on this patient, as hospitalists switch teams weekly. If 7PM-7AM, please contact night-coverage at [email or web address], password [email or web address] for any overnight needs.  01/03/2017, 7:19 AM

## 2017-01-04 ENCOUNTER — Inpatient Hospital Stay (HOSPITAL_COMMUNITY): Payer: Medicaid Other

## 2017-01-04 DIAGNOSIS — J45909 Unspecified asthma, uncomplicated: Secondary | ICD-10-CM

## 2017-01-04 DIAGNOSIS — F1120 Opioid dependence, uncomplicated: Secondary | ICD-10-CM

## 2017-01-04 DIAGNOSIS — F332 Major depressive disorder, recurrent severe without psychotic features: Secondary | ICD-10-CM

## 2017-01-04 LAB — VANCOMYCIN, TROUGH: VANCOMYCIN TR: 44 ug/mL — AB (ref 15–20)

## 2017-01-04 LAB — GLUCOSE, CAPILLARY
GLUCOSE-CAPILLARY: 104 mg/dL — AB (ref 65–99)
GLUCOSE-CAPILLARY: 146 mg/dL — AB (ref 65–99)
GLUCOSE-CAPILLARY: 86 mg/dL (ref 65–99)
Glucose-Capillary: 102 mg/dL — ABNORMAL HIGH (ref 65–99)
Glucose-Capillary: 94 mg/dL (ref 65–99)

## 2017-01-04 LAB — BASIC METABOLIC PANEL
ANION GAP: 11 (ref 5–15)
BUN: <5 mg/dL — ABNORMAL LOW (ref 6–20)
CALCIUM: 9.2 mg/dL (ref 8.9–10.3)
CHLORIDE: 95 mmol/L — AB (ref 101–111)
CO2: 31 mmol/L (ref 22–32)
CREATININE: 0.79 mg/dL (ref 0.44–1.00)
GFR calc non Af Amer: >60 mL/min (ref >60)
Glucose, Bld: 109 mg/dL — ABNORMAL HIGH (ref 65–99)
Potassium: 3.8 mmol/L (ref 3.5–5.1)
Sodium: 137 mmol/L (ref 135–145)

## 2017-01-04 LAB — CBC
HEMATOCRIT: 31.5 % — AB (ref 36.0–46.0)
Hemoglobin: 9.6 g/dL — ABNORMAL LOW (ref 12.0–15.0)
MCH: 28.3 pg (ref 26.0–34.0)
MCHC: 30.5 g/dL (ref 30.0–36.0)
MCV: 92.9 fL (ref 78.0–100.0)
PLATELETS: 282 10*3/uL (ref 150–400)
RBC: 3.39 MIL/uL — ABNORMAL LOW (ref 3.87–5.11)
RDW: 15.5 % (ref 11.5–15.5)
WBC: 8.1 10*3/uL (ref 4.0–10.5)

## 2017-01-04 LAB — MAGNESIUM: MAGNESIUM: 1.9 mg/dL (ref 1.7–2.4)

## 2017-01-04 NOTE — Progress Notes (Signed)
Pt still refusing to get up OOB and/or ambulate; will cont. To monitor.  [name]

## 2017-01-04 NOTE — Progress Notes (Signed)
9 Days Post-Op Procedure(s) (LRB): LEFT VIDEO ASSISTED THORACOSCOPY (VATS)/DECORTICATION (Left) VIDEO BRONCHOSCOPY WITH BRONCHIAL LAVAGE (N/A) Subjective: Multiple complaints Refusing to ambulate  Objective: Vital signs in last 24 hours: Temp:  [97.8 F (36.6 C)-98.9 F (37.2 C)] 97.8 F (36.6 C) (02/11 0825) Pulse Rate:  [69-100] 74 (02/11 0800) Cardiac Rhythm: Normal sinus rhythm (02/11 0400) Resp:  [10-26] 19 (02/11 0800) BP: (90-159)/(58-97) 112/89 (02/11 0800) SpO2:  [92 %-99 %] 97 % (02/11 0811) Weight:  [163 lb 9.3 oz (74.2 kg)] 163 lb 9.3 oz (74.2 kg) (02/11 0500)  Hemodynamic parameters for last 24 hours:    Intake/Output from previous day: 02/10 0701 - 02/11 0700 In: 2230 [P.O.:1800; I.V.:180; IV Piggyback:250] Out: 1025 [Urine:1025] Intake/Output this shift: Total I/O In: 210 [I.V.:10; IV Piggyback:200] Out: 200 [Urine:200]  General appearance: alert and uncooperative Neurologic: no focal deficit Heart: regular rate and rhythm Lungs: diminished breath sounds bilaterally Abdomen: normal findings: soft, non-tender  Lab Results:  Recent Labs  01/03/17 0345 01/04/17 0500  WBC 8.4 8.1  HGB 9.5* 9.6*  HCT 31.0* 31.5*  PLT 287 282   BMET:  Recent Labs  01/03/17 0345 01/04/17 0500  NA 137 137  K 3.7 3.8  CL 93* 95*  CO2 33* 31  GLUCOSE 114* 109*  BUN <5* <5*  CREATININE 0.50 0.79  CALCIUM 8.8* 9.2    PT/INR: No results for input(s): LABPROT, INR in the last 72 hours. ABG    Component Value Date/Time   PHART 7.426 12/29/2016 0500   HCO3 28.2 (H) 12/29/2016 0500   TCO2 37 12/31/2016 1613   ACIDBASEDEF 1.5 12/28/2016 0404   O2SAT 98.4 12/29/2016 0500   CBG (last 3)   Recent Labs  01/03/17 1713 01/03/17 2141 01/04/17 0817  GLUCAP 129* 102* 104*    Assessment/Plan: S/P Procedure(s) (LRB): LEFT VIDEO ASSISTED THORACOSCOPY (VATS)/DECORTICATION (Left) VIDEO BRONCHOSCOPY WITH BRONCHIAL LAVAGE (N/A) -  No air leak from CT today!,  minimal drainage Will keep tube today, if no air leak in AM will dc Needs to cooperate with pulmonary hygiene, mobility Remains on vanco for MRSA empyema No watery diarrhea   LOS: 23 days    Melrose Nakayama 01/04/2017

## 2017-01-04 NOTE — Progress Notes (Signed)
Pt up to bedside commode. Attempted to get pt to sit up in the chair or walk in the halls, but pt refused. Education pt on the importance of increasing her mobility. Pt verbalized understanding but continued to refuse

## 2017-01-04 NOTE — Progress Notes (Signed)
Pt refusing to get up OOB; pt encouraged to get in chair for meals; pt refusing to get OOB at this time; pt prefers to use bedpan instead of getting OOB to [facility]; will cont. To encourage.  [name]

## 2017-01-04 NOTE — Progress Notes (Signed)
      [address]       [place] [address]             [phone]      Continues to request pain meds frequently, even when oversedated  BP 114/67   Pulse 73   Temp 98.1 F (36.7 C) (Oral)   Resp 11   Ht 5' 1.5" (1.562 m)   Wt 163 lb 9.3 oz (74.2 kg)   SpO2 99%   BMI 30.41 kg/m    Intake/Output Summary (Last 24 hours) at 01/04/17 1818 Last data filed at 01/04/17 1800  Gross per 24 hour  Intake             1420 ml  Output             1225 ml  Net              195 ml   If no air leak in AM will dc CT Still waiting on stepdown bed  Shareena Nusz C. Roxan Hockey, MD [facility] [phone]

## 2017-01-04 NOTE — Progress Notes (Signed)
Pharmacy Antibiotic Note  [name] is a 51 y.o. female admitted on 12/12/2016 with LLL MRSE empyema.  Pharmacy has been consulted for vancomycin dosing.  Vancomycin trough was low (8) on 2/9, and dose was increased to 1g IV q8h.  Vancomycin trough this afternoon is supratherapeutic at 44. SCr has trended up slightly to 0.79, CrCl~77, UOP decreased to 0.6 ml/kg/hr. Dose has already been hung for 1600 per chart - spoke with RN and will stop dose if still running.  Plan: Hold vancomycin Check vancomycin random level with AM labs to assess clearance Monitor renal function, c/s, de-escalation plan/LOT   Height: 5' 1.5" (156.2 cm) Weight: 163 lb 9.3 oz (74.2 kg) IBW/kg (Calculated) : 48.95  Temp (24hrs), Avg:98.1 F (36.7 C), Min:97.4 F (36.3 C), Max:98.9 F (37.2 C)   Recent Labs Lab 12/29/16 0430  12/31/16 0446 12/31/16 1613 01/01/17 0400 01/02/17 0244 01/03/17 0345 01/04/17 0500 01/04/17 1510  WBC 8.9  --   --   --   --   --  8.4 8.1  --   CREATININE 0.44  < > 0.44 0.50 0.42*  --  0.50 0.79  --   VANCOTROUGH  --   --   --   --   --  10*  --   --  44*  < > = values in this interval not displayed.  Estimated Creatinine Clearance: 77.6 mL/min (by C-G formula based on SCr of 0.79 mg/dL).    Allergies  Allergen Reactions  . Cymbalta [Duloxetine Hcl] Other (See Comments)    Mood changes...mean  . Mucinex [Guaifenesin Er] Other (See Comments)    Possible allergy, per patient (??)  . Nyquil Multi-Symptom [Pseudoeph-Doxylamine-Dm-Apap] Hives  . Paxil [Paroxetine Hcl] Other (See Comments)    Mean and aggresive  . Promethazine Itching  . Tramadol Itching  . Wellbutrin [Bupropion] Other (See Comments)    Rapid heartrate  . Zoloft [Sertraline] Other (See Comments)    Anger    Antimicrobials this admission: 2/6 vanc>> 2/3 eraxis>>2/6 2/2 zosyn>>2/7 1/29 flagyl>> 2/2; resumed 2/3>>2/8 1/30 vanc enema>>2/7 Fluconazole PO 2/7>> (14 days of total tx post-op) Vanc PO  2/8>>(stop date NOT in place; should stop after 2/21 doses)  1/19 ceftriaxone > 1/24 1/19 azithromycin > 1/22 1/22 acyclovir > 1/29 1/24 unasyn >>2/2  Dose adjustments this admission: *VT 2/9: ~8 on 1g IV q12h *VT 2/11: 44 on 1g IV q8h - hold vanc  Microbiology results: 2/2 bronch washing- few candida albicans, dubliniensis 2/2 LLL lung, fungal cx: candida albicans 2/2 LLL wound cx, deep: few candida albicans, rare MRSE 1/28 c diff + 1/19 legionella: negative 1/19 blood cx: neg 1/20 mrsa pcr: neg 1/20 rvp: neg 1/20 resp cx: abundant strep pneumo 1/20 strep pneumo: positive 1/22 HSV swab: HSV1 positive 1/24 BCx: neg  Elicia Lamp, PharmD, BCPS Clinical Pharmacist 01/04/2017 4:37 PM

## 2017-01-04 NOTE — Progress Notes (Signed)
Progress Note    [name]  [id number] DOB: 09/05/1966  DOA: 12/12/2016 PCP: [name], MD    Brief Narrative:   Chief complaint: Follow-up pneumonia/empyema/C. difficile colitis  [name] is an 51 y.o. female with a PMH of 45 pack year history of tobacco abuse, asthma, allergic rhinitis, COPD, hypertension and bipolar disorder who was admitted by the critical care team 12/12/16 with acute hypoxic respiratory failure, asthma exacerbation and multilobar pneumonia. She was also encephalopathic on admission. Her hospital course has been complicated by the development of C. difficile colitis, Streptococcus pneumonia lung abscess/empyema status post VATS on 12/26/16, ileus, pneumothorax, VDRF requiring intubation (intubated 12/12/16-12/21/16, reintubated 12/26/16-12/29/16), and demand ischemia. Patient transferred to the care of [facility] on 01/03/17.  Culture data is as follows: 1/19 legionella: negative 1/19 blood cx: neg 1/20 mrsa pcr: neg 1/20 rvp: neg 1/20 resp cx: abundant strep pneumo 1/20 strep pneumo: positive 1/22 HSV swab: HSV1 positive 1/24 BCx: neg 1/28 c diff + 2/2 bronch washing- few candida albicans, dubliniensis 2/2 LLL lung, fungal cx: candida albicans 2/2 LLL wound cx, deep: few candida albicans, rare MRSE  Assessment/Plan:   Principal Problem:   Acute respiratory failure with hypoxia ([place]), Multifactorial Required intubation 12/12/16-12/21/16 with reintubation 12/26/16 through 12/29/16. Continue to wean oxygen. Continue aggressive pulmonary toilet. Continue duo nebs every 6 hours and Pulmicort twice a day. Respiratory status remained stable.    Pneumonia with cavity of lung/Abscess of lower lobe of left lung with pneumonia (HCC)/Empyema of pleural space (HCC)/Pneumonia of left upper lobe due to Streptococcus pneumoniae (HCC)/HCAP/pneumothorax/sepsis Initial respiratory cultures grew strep pneumonia with a positive strep pneumonia antigen. Status post VATS 12/26/16.  Residual chest tube in place with persistent air leak. OR cultures + staph epidermidis. BAL cultures + for Candida. Continue vancomycin for MRSE, and Diflucan for Candida. Dr. Megan Salon of ID following for recommendations regarding duration of therapy.  Active Problems:   Acute encephalopathy secondary to sepsis/bipolar disorder Continue to minimize narcotics. Home medications including Neurontin, Klonopin, Lexapro, Lamictal, Seroquel have been resumed. Patient more sedated today and per nursing, has been refusing to get out of bed.    Dysphagia/ileus Initially managed with TPN given ileus. Being followed by speech therapist. Modified barium swallow done 01/02/17. Diet subsequently advanced to regular with thin liquids.    Pressure injury of skin Skin care per nursing.    Acute kidney injury ([place]) Resolved.    Hypertension Continue scheduled Lopressor and when necessary labetalol.    Transaminitis Resolved.    Enteritis due to Clostridium difficile/abdominal distention Initially treated with vancomycin enemas and IV Flagyl. Continue oral vancomycin. Flexiseal in place.    Physical deconditioning Physical therapy following. SNF recommended but patient is declining at present. Says she has 24-hour care available.    Normocytic anemia Likely anemia of chronic disease/inflammation. Hemoglobin stable.    Hypomagnesemia Replaced.   Family Communication/Anticipated D/C date and plan/Code Status   DVT prophylaxis: Lovenox ordered. Code Status: Full Code.  Family Communication: No family present at the bedside. Disposition Plan: Once to be discharged home although PT recommends SNF.   Medical Consultants:    CVTS  PCCM  Neurology  ID   Procedures:    VATS2/2/18  Anti-Infectives:   2/6 vanc>> 2/3 eraxis>>2/6 2/2 zosyn>>2/7 1/29 flagyl>> 2/2; resumed 2/3>> 1/30 vanc enema>>2/7 Fluconazole PO 2/7>> Vanc PO 2/8>> 1/19 ceftriaxone > 1/24 1/19 azithromycin >  1/22 1/22 acyclovir > 1/29 1/24 unasyn >>2/2  Subjective:   The patient  was sleeping when I rounded on her, when awakened, has no complaints.  Objective:    Vitals:   01/04/17 0300 01/04/17 0400 01/04/17 0500 01/04/17 0600  BP: 119/78 121/85  119/67  Pulse: 76 74 97 81  Resp: _0 Temp:  97.8 F (36.6 C)    TempSrc:  Oral    SpO2: 95% 97% 97% 99%  Weight:   74.2 kg (163 lb 9.3 oz)   Height:        Intake/Output Summary (Last 24 hours) at 01/04/17 0715 Last data filed at 01/04/17 0600  Gross per 24 hour  Intake             2280 ml  Output             1025 ml  Net             1255 ml   Filed Weights   01/01/17 0500 01/02/17 0600 01/04/17 0500  Weight: 74.7 kg (164 lb 10.9 oz) 70 kg (154 lb 5.2 oz) 74.2 kg (163 lb 9.3 oz)    Exam: General exam: Appears older than her stated age, asleep. Respiratory system: Diminished breath sounds. Chest tube left flank. Cardiovascular system: Regular rate, and rhythm. No JVD,  rubs, gallops or clicks. No murmurs. Gastrointestinal system: Abdomen is nondistended, soft and nontender. No organomegaly or masses felt. Normal bowel sounds heard. Central nervous system: Lethargic. No focal neurological deficits. Extremities: No clubbing,  or cyanosis. No edema. Skin: No rashes, lesions or ulcers. Psychiatry: Judgement and insight appear impaired. Mood & affect flat.   Data Reviewed:   I have personally reviewed following labs and imaging studies:  Labs: Basic Metabolic Panel:  Recent Labs Lab 12/29/16 0430  12/30/16 0400  12/31/16 0446 12/31/16 1613 01/01/17 0400 01/03/17 0345 01/04/17 0500  NA 140  < > 140  < > 139 136 134* 137 137  K 3.5  < > 3.6  < > 3.4* 3.6 3.4* 3.7 3.8  CL 101  < > 97*  < > 93* 87* 89* 93* 95*  CO2 29  --  32  --  36*  --  36* 33* 31  GLUCOSE 118*  < > 115*  < > 138* 182* 129* 114* 109*  BUN 9  < > 11  < > _1 <5* <5*  CREATININE 0.44  < > 0.44  < > 0.44 0.50 0.42* 0.50 0.79  CALCIUM 8.6*   --  9.0  --  8.7*  --  8.8* 8.8* 9.2  MG 1.5*  --  1.5*  --  1.5*  --  1.2*  --  1.9  PHOS 3.6  --  3.3  --   --   --  3.0  --   --   < > = values in this interval not displayed. GFR Estimated Creatinine Clearance: 77.6 mL/min (by C-G formula based on SCr of 0.79 mg/dL). Liver Function Tests:  Recent Labs Lab 12/29/16 0430 01/01/17 0400  AST 14* 12*  ALT 19 14  ALKPHOS 54 64  BILITOT 0.4 0.5  PROT 5.1* 5.3*  ALBUMIN 2.1* 2.1*    CBC:  Recent Labs Lab 12/29/16 0430 12/29/16 1553 12/30/16 1543 12/31/16 1613 01/03/17 0345 01/04/17 0500  WBC 8.9  --   --   --  8.4 8.1  NEUTROABS 7.2  --   --   --   --   --   HGB 10.2* 9.2* 11.9* 11.2* 9.5* 9.6*  HCT 31.6* 27.0* 35.0* 33.0* 31.0* 31.5*  MCV 90.0  --   --   --  92.5 92.9  PLT 432*  --   --   --  287 282   CBG:  Recent Labs Lab 01/02/17 2025 01/03/17 0851 01/03/17 1222 01/03/17 1713 01/03/17 2141  GLUCAP 123* 115* 138* 129* 102*    Microbiology Recent Results (from the past 240 hour(s))  Fungus Culture With Stain     Status: None (Preliminary result)   Collection Time: 12/26/16  2:32 PM  Result Value Ref Range Status   Fungus Stain Final report  Final    Comment: (NOTE) Performed At: [facility] [place], [place] [id number] Lindon Romp MD KW:[id number]    Fungus (Mycology) Culture PENDING  Incomplete   Fungal Source FLUID  Final  Aerobic/Anaerobic Culture (surgical/deep wound)     Status: None   Collection Time: 12/26/16  2:32 PM  Result Value Ref Range Status   Specimen Description FLUID  Final   Special Requests PLEURAL, LEFT LUNG  Final   Gram Stain   Final    RARE WBC PRESENT, PREDOMINANTLY PMN NO ORGANISMS SEEN    Culture   Final    RARE CANDIDA ALBICANS RARE CANDIDA DUBLINIENSIS NO ANAEROBES ISOLATED    Report Status 12/31/2016 FINAL  Final  Acid Fast Smear (AFB)     Status: None   Collection Time: 12/26/16  2:32 PM  Result Value Ref Range Status   AFB Specimen  Processing Concentration  Final   Acid Fast Smear Negative  Final    Comment: (NOTE) Performed At: [facility] [place], [place] [id number] Lindon Romp MD QQ:[id number]    Source (AFB) FLUID  Final  Fungus Culture Result     Status: None   Collection Time: 12/26/16  2:32 PM  Result Value Ref Range Status   Result 1 Comment  Final    Comment: (NOTE) KOH/Calcofluor preparation:  no fungus observed. Performed At: [facility] [address] [place], [place] [id number] Lindon Romp MD GY:[id number]   Culture, fungus without smear     Status: Abnormal (Preliminary result)   Collection Time: 12/26/16  2:53 PM  Result Value Ref Range Status   Specimen Description LUNG  Final   Special Requests LEFT LOWER LUNG  Final   Culture CANDIDA ALBICANS (A)  Final   Report Status PENDING  Incomplete  Aerobic/Anaerobic Culture (surgical/deep wound)     Status: None   Collection Time: 12/26/16  2:53 PM  Result Value Ref Range Status   Specimen Description LUNG  Final   Special Requests LEFT LOWER LUNG  Final   Gram Stain   Final    ABUNDANT WBC PRESENT, PREDOMINANTLY PMN MODERATE GRAM POSITIVE COCCI IN PAIRS MODERATE YEAST MODERATE GRAM POSITIVE RODS    Culture   Final    FEW CANDIDA ALBICANS RARE STAPHYLOCOCCUS EPIDERMIDIS NO ANAEROBES ISOLATED    Report Status 12/31/2016 FINAL  Final   Organism ID, Bacteria STAPHYLOCOCCUS EPIDERMIDIS  Final      Susceptibility   Staphylococcus epidermidis - MIC*    CIPROFLOXACIN >=8 RESISTANT Resistant     ERYTHROMYCIN >=8 RESISTANT Resistant     GENTAMICIN <=0.5 SENSITIVE Sensitive     OXACILLIN >=4 RESISTANT Resistant     TETRACYCLINE <=1 SENSITIVE Sensitive     VANCOMYCIN 2 SENSITIVE Sensitive     TRIMETH/SULFA 80 RESISTANT Resistant     CLINDAMYCIN >=8 RESISTANT Resistant  RIFAMPIN <=0.5 SENSITIVE Sensitive     Inducible Clindamycin NEGATIVE Sensitive     * RARE STAPHYLOCOCCUS EPIDERMIDIS  Acid Fast  Smear (AFB)     Status: None   Collection Time: 12/26/16  2:53 PM  Result Value Ref Range Status   AFB Specimen Processing Concentration  Final   Acid Fast Smear Negative  Final    Comment: (NOTE) Performed At: [facility] [place], [place] [id number] Lindon Romp MD [id number]:[id number]    Source (AFB) LEFT LOWER LUNG  Final  Aerobic/Anaerobic Culture (surgical/deep wound)     Status: None   Collection Time: 12/26/16  4:25 PM  Result Value Ref Range Status   Specimen Description BRONCHIAL WASHINGS  Final   Special Requests LEFT LUNG  Final   Gram Stain   Final    ABUNDANT WBC PRESENT, PREDOMINANTLY PMN RARE YEAST    Culture   Final    FEW CANDIDA ALBICANS FEW CANDIDA DUBLINIENSIS NO ANAEROBES ISOLATED    Report Status 12/31/2016 FINAL  Final    Radiology: Dg Chest Port 1 View  Result Date: 01/03/2017 CLINICAL DATA:  Continued surveillance pneumothorax. EXAM: PORTABLE CHEST 1 VIEW COMPARISON:  01/02/2017. FINDINGS: LEFT chest tube unchanged. Streaky LEFT lung densities related to fluid and/or subsegmental atelectasis. No definite LEFT apical pneumothorax is seen on today's study. PICC line tip mid SVC from LEFT arm approach. The small RIGHT effusion stable. Stable cardiomediastinal silhouette. IMPRESSION: No definite LEFT apical pneumothorax on today's exam. Support tubes and apparatus unchanged. Electronically Signed   By: Staci Righter M.D.   On: 01/03/2017 07:31   Dg Chest Port 1 View  Result Date: 01/02/2017 CLINICAL DATA:  Empyema, asthma, hypertension, COPD EXAM: PORTABLE CHEST 1 VIEW COMPARISON:  Portable exam 0545 hours compared 01/01/2017 FINDINGS: LEFT arm PICC line tip projects over SVC. Pair of LEFT thoracostomy tubes present. EKG leads project over chest. Normal heart size, mediastinal contours, and pulmonary vascularity. Persistent small LEFT pleural effusion and basilar atelectasis. Increased atelectasis and blunting of the RIGHT lateral  costophrenic angle. Tiny persistent LEFT apical pneumothorax. Upper lungs clear. Healing told lateral lower RIGHT rib fractures. IMPRESSION: Tiny persistent LEFT apical pneumothorax. Persistent mild LEFT pleural effusion and bibasilar atelectasis. Electronically Signed   By: Lavonia Dana M.D.   On: 01/02/2017 08:45   Dg Swallowing Func-speech Pathology  Result Date: 01/02/2017 Objective Swallowing Evaluation: Type of Study: MBS-Modified Barium Swallow Study Patient Details Name: Vylette JAIDENCE GEISLER MRN: [id number] Date of Birth: 12/18/1965 Today's Date: 01/02/2017 Time: SLP Start Time (ACUTE ONLY): 1415-SLP Stop Time (ACUTE ONLY): 1445 SLP Time Calculation (min) (ACUTE ONLY): 30 min Past Medical History: Past Medical History: Diagnosis Date . Allergic rhinitis  . Arthritis  . Asthma  . Back pain  . Bipolar 1 disorder ([place])  . Chronic low back pain 03/06/2014 . COPD (chronic obstructive pulmonary disease) ([place])  . Depression  . Fibroid  . Hypertension  . IBS (irritable bowel syndrome)  . Migraine  . Scoliosis  Past Surgical History: Past Surgical History: Procedure Laterality Date . CHOLECYSTECTOMY   . DILATION AND CURETTAGE OF UTERUS   . EXPLORATORY LAPAROTOMY    WHEN PATIENT WAS YOUNG . RIGHT FOOT SURGERY   . TUBAL LIGATION Bilateral  . VIDEO ASSISTED THORACOSCOPY (VATS)/DECORTICATION Left 12/26/2016  Procedure: LEFT VIDEO ASSISTED THORACOSCOPY (VATS)/DECORTICATION;  Surgeon: Ivin Poot, MD;  Location: [facility];  Service: Thoracic;  Laterality: Left; Marland Kitchen VIDEO BRONCHOSCOPY N/A 12/26/2016  Procedure: VIDEO BRONCHOSCOPY WITH BRONCHIAL LAVAGE;  Surgeon: Ivin Poot, MD;  Location: [facility] OR;  Service: Thoracic;  Laterality: N/A; HPI: 51 y/o F, 45 pk year smoker, with Hx of COPD/asthma and bipolar disorder admitted 1/19 with SOB. Working diagnosis of acute asthma exacerbation, multilobar PNA with resultant hypoxic respiratory failure. Intubated 12/12/16 to 12/20/16. Left VATS procedure on 12/26/16. Reintubated 2/2-2/5.  Initial  MBS 12/22/16  Subjective: alert for evaluation, requires cues to maintain alertness Assessment / Plan / Recommendation CHL IP CLINICAL IMPRESSIONS 01/02/2017 Therapy Diagnosis Mild oral phase dysphagia;Mild pharyngeal phase dysphagia Clinical Impression Pt demonstrates improved overall swallow function and airway protection.  She continues to demonstrate delayed onset of swallow to the pyriforms with thin liquids, and when consuming large, consecutive boluses, trace aspiration of thins was observed just below vocal folds.  No spontaneous cough response was elicited. However, when limiting boluses to single sips, there was only occasional high penetration, but no aspiration.  Pharyngeal clearance of all materials was excellent; oral phase WNL.  Given pt's improved mental status, recommend advancing diet to regular, thin liquids; meds whole in puree. She should follow basic precautions to ensure safety.  SLP will follow briefly to facilitate safety/education.  Impact on safety and function Mild aspiration risk   CHL IP TREATMENT RECOMMENDATION 12/22/2016 Treatment Recommendations Therapy as outlined in treatment plan below   Prognosis 12/22/2016 Prognosis for Safe Diet Advancement Good Barriers to Reach Goals -- Barriers/Prognosis Comment -- CHL IP DIET RECOMMENDATION 01/02/2017 SLP Diet Recommendations Regular solids;Thin liquid Liquid Administration via Cup;No straw Medication Administration Whole meds with puree Compensations Slow rate;Small sips/bites Postural Changes --   CHL IP OTHER RECOMMENDATIONS 01/02/2017 Recommended Consults -- Oral Care Recommendations Oral care BID Other Recommendations --   CHL IP FOLLOW UP RECOMMENDATIONS 12/30/2016 Follow up Recommendations Other (comment)   CHL IP FREQUENCY AND DURATION 12/22/2016 Speech Therapy Frequency (ACUTE ONLY) min 2x/week Treatment Duration 2 weeks      CHL IP ORAL PHASE 01/02/2017 Oral Phase WFL Oral - Pudding Teaspoon -- Oral - Pudding Cup -- Oral - Honey Teaspoon --  Oral - Honey Cup -- Oral - Nectar Teaspoon -- Oral - Nectar Cup -- Oral - Nectar Straw -- Oral - Thin Teaspoon -- Oral - Thin Cup -- Oral - Thin Straw -- Oral - Puree -- Oral - Mech Soft -- Oral - Regular -- Oral - Multi-Consistency -- Oral - Pill -- Oral Phase - Comment --  CHL IP PHARYNGEAL PHASE 01/02/2017 Pharyngeal Phase Impaired Pharyngeal- Pudding Teaspoon -- Pharyngeal -- Pharyngeal- Pudding Cup -- Pharyngeal -- Pharyngeal- Honey Teaspoon -- Pharyngeal -- Pharyngeal- Honey Cup NT Pharyngeal -- Pharyngeal- Nectar Teaspoon NT Pharyngeal -- Pharyngeal- Nectar Cup Delayed swallow initiation-pyriform sinuses Pharyngeal Material does not enter airway Pharyngeal- Nectar Straw -- Pharyngeal -- Pharyngeal- Thin Teaspoon -- Pharyngeal -- Pharyngeal- Thin Cup Delayed swallow initiation-pyriform sinuses;Trace aspiration Pharyngeal -- Pharyngeal- Thin Straw -- Pharyngeal -- Pharyngeal- Puree Delayed swallow initiation-vallecula Pharyngeal -- Pharyngeal- Mechanical Soft -- Pharyngeal -- Pharyngeal- Regular -- Pharyngeal -- Pharyngeal- Multi-consistency -- Pharyngeal -- Pharyngeal- Pill -- Pharyngeal -- Pharyngeal Comment --  CHL IP CERVICAL ESOPHAGEAL PHASE 01/02/2017 Cervical Esophageal Phase WFL Pudding Teaspoon -- Pudding Cup -- Honey Teaspoon -- Honey Cup -- Nectar Teaspoon -- Nectar Cup -- Nectar Straw -- Thin Teaspoon -- Thin Cup -- Thin Straw -- Puree -- Mechanical Soft -- Regular -- Multi-consistency -- Pill -- Cervical Esophageal Comment -- No flowsheet data found. Juan Quam Laurice 01/02/2017, 3:17 PM  CXR 1/19>> Consolidations bilaterally CT head 1/19>> Left occipital lobe probable acute or early subacute infarction. No acute intracranial hemorrhage. 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 There is a small amount of fluid in this structure. Also a 1 cm nodular structure in the left lower lobe with minimal cavitation.  Abd xr 1/27 >> bowel distended with gas CT Chest 2/1 >> Moderate left hydropneumothorax, no change in size of pulmonary abscess in the lingula, interval increase in loculated fluid at the left lung base with scattered air pockets and lower lobe atelectasis  Medications:   . bisacodyl  10 mg Oral Daily  . budesonide (PULMICORT) nebulizer solution  0.5 mg Nebulization BID  . Chlorhexidine Gluconate Cloth  6 each Topical Daily  . enoxaparin (LOVENOX) injection  40 mg Subcutaneous Q24H  . escitalopram  40 mg Oral Daily  . fluconazole  400 mg Oral Daily  . gabapentin  300 mg Oral TID  . insulin aspart  0-15 Units Subcutaneous TID WC  . insulin aspart  0-5 Units Subcutaneous QHS  . lamoTRIgine  100 mg Oral BID  . mouth rinse  15 mL Mouth Rinse BID  . metoprolol tartrate  25 mg Oral BID  . pantoprazole  40 mg Oral Q1200  . QUEtiapine  100 mg Oral QHS  . senna-docusate  1 tablet Oral QHS  . sodium chloride flush  10-40 mL Intracatheter Q12H  . vancomycin  250 mg Oral Q6H  . vancomycin  1,000 mg Intravenous Q8H   Continuous Infusions: . dextrose 5 % and 0.45% NaCl 10 mL/hr at 01/04/17 0600    Medical decision making is of high complexity and this patient is at high risk of deterioration, therefore this is a level 3 visit.  (> 4 problem points, 2 data points, high risk)    LOS: 23 days   Odysseus Cada  [facility] Pager [phone]. If unable to reach me by pager, please call my cell phone at [phone].  *Please refer to [email or web address], password [email or web address] to get updated schedule on who will round on this patient, as hospitalists switch teams weekly. If 7PM-7AM, please contact night-coverage at [email or web address], password [email or web address] for any overnight needs.  01/04/2017, 7:15 AM

## 2017-01-04 NOTE — Progress Notes (Signed)
IV Vanc stopped 1630; will cont. To monitor.  [name] Reason

## 2017-01-05 ENCOUNTER — Inpatient Hospital Stay (HOSPITAL_COMMUNITY): Payer: Medicaid Other

## 2017-01-05 DIAGNOSIS — B379 Candidiasis, unspecified: Secondary | ICD-10-CM

## 2017-01-05 DIAGNOSIS — F17210 Nicotine dependence, cigarettes, uncomplicated: Secondary | ICD-10-CM

## 2017-01-05 DIAGNOSIS — Z9889 Other specified postprocedural states: Secondary | ICD-10-CM

## 2017-01-05 DIAGNOSIS — B957 Other staphylococcus as the cause of diseases classified elsewhere: Secondary | ICD-10-CM

## 2017-01-05 DIAGNOSIS — B3789 Other sites of candidiasis: Secondary | ICD-10-CM

## 2017-01-05 DIAGNOSIS — A0472 Enterocolitis due to Clostridium difficile, not specified as recurrent: Secondary | ICD-10-CM

## 2017-01-05 LAB — VANCOMYCIN, RANDOM: VANCOMYCIN RM: 50

## 2017-01-05 MED ORDER — PROCHLORPERAZINE EDISYLATE 5 MG/ML IJ SOLN
10.0000 mg | Freq: Once | INTRAMUSCULAR | Status: AC
  Administered 2017-01-05: 10 mg via INTRAVENOUS
  Filled 2017-01-05 (×2): qty 2

## 2017-01-05 MED ORDER — ENSURE ENLIVE PO LIQD
237.0000 mL | Freq: Three times a day (TID) | ORAL | Status: DC
  Administered 2017-01-05 – 2017-01-12 (×12): 237 mL via ORAL

## 2017-01-05 NOTE — Progress Notes (Signed)
Speech Language Pathology Treatment: Dysphagia  Patient Details Name: [name] MRN: [id number] DOB: May 23, 1966 Today's Date: 01/05/2017 Time: [id number] SLP Time Calculation (min) (ACUTE ONLY): 14 min  Assessment / Plan / Recommendation Clinical Impression  F/u diet tolerance complete revealed what appears to be good tolerance of po diet since upgrade following repeat MBS. Patient able to self feed regular solids, thin liquids with min-moderate cueing for small single sips and slowed rate of intake. Education complete regarding rationale for meds whole in puree rather than with thin liquids given aspiration risk, patient verbalizing understanding. Intermittent delayed cough noted, not necessarily appearing related to dysphagia. Noted MD notes with persistent cough but clear CXR. Overall, current diet remains appropriate. Will f/u.    HPI HPI: 51 y/o F, 22 pk year smoker, with Hx of COPD/asthma and bipolar disorder admitted 1/19 with SOB. Working diagnosis of acute asthma exacerbation, multilobar PNA with resultant hypoxic respiratory failure. Intubated 12/12/16 to 12/20/16. Left VATS procedure on 12/26/16. Reintubated 2/2-2/5.  Initial MBS 12/22/16       SLP Plan  Goals updated     Recommendations  Diet recommendations: Regular;Thin liquid Liquids provided via: Cup;Straw Medication Administration: Whole meds with puree Supervision: Patient able to self feed;Intermittent supervision to cue for compensatory strategies Compensations: Slow rate;Small sips/bites Postural Changes and/or Swallow Maneuvers: Seated upright 90 degrees                Oral Care Recommendations: Oral care BID Plan: Goals updated       [name], CCC-SLP [phone]   [name] 01/05/2017, 10:50 AM

## 2017-01-05 NOTE — Progress Notes (Signed)
10 Days Post-Op Procedure(s) (LRB): LEFT VIDEO ASSISTED THORACOSCOPY (VATS)/DECORTICATION (Left) VIDEO BRONCHOSCOPY WITH BRONCHIAL LAVAGE (N/A) Subjective: MRSA empyema- single chest tube remains Small air leak with cough, CXR clear Leave CT to water seal Objective: Vital signs in last 24 hours: Temp:  [97.4 F (36.3 C)-98.9 F (37.2 C)] 97.9 F (36.6 C) (02/12 0805) Pulse Rate:  [65-105] 105 (02/12 0900) Cardiac Rhythm: Normal sinus rhythm (02/12 0759) Resp:  [7-17] 17 (02/12 0900) BP: (91-159)/(59-133) 110/99 (02/12 0900) SpO2:  [91 %-100 %] 98 % (02/12 0900) Weight:  [156 lb 8.4 oz (71 kg)] 156 lb 8.4 oz (71 kg) (02/12 0454)  Hemodynamic parameters for last 24 hours:  afebrile  Intake/Output from previous day: 02/11 0701 - 02/12 0700 In: 950 [P.O.:360; I.V.:190; IV Piggyback:400] Out: 1225 [Urine:1225] Intake/Output this shift: Total I/O In: 500 [P.O.:480; I.V.:20] Out: 201 [Urine:200; Stool:1]       Exam    General- alert and comfortable   Lungs- clear without rales, wheezes   Cor- regular rate and rhythm, no murmur , gallop   Abdomen- soft, non-tender   Extremities - warm, non-tender, minimal edema   Neuro- oriented, appropriate, no focal weakness   Lab Results:  Recent Labs  01/03/17 0345 01/04/17 0500  WBC 8.4 8.1  HGB 9.5* 9.6*  HCT 31.0* 31.5*  PLT 287 282   BMET:  Recent Labs  01/03/17 0345 01/04/17 0500  NA 137 137  K 3.7 3.8  CL 93* 95*  CO2 33* 31  GLUCOSE 114* 109*  BUN <5* <5*  CREATININE 0.50 0.79  CALCIUM 8.8* 9.2    PT/INR: No results for input(s): LABPROT, INR in the last 72 hours. ABG    Component Value Date/Time   PHART 7.426 12/29/2016 0500   HCO3 28.2 (H) 12/29/2016 0500   TCO2 37 12/31/2016 1613   ACIDBASEDEF 1.5 12/28/2016 0404   O2SAT 98.4 12/29/2016 0500   CBG (last 3)   Recent Labs  01/04/17 1210 01/04/17 1558 01/04/17 1938  GLUCAP 94 146* 86    Assessment/Plan: S/P Procedure(s) (LRB): LEFT VIDEO  ASSISTED THORACOSCOPY (VATS)/DECORTICATION (Left) VIDEO BRONCHOSCOPY WITH BRONCHIAL LAVAGE (N/A) Cont chest tube water seal   LOS: 24 days    Tharon Aquas Trigt III 01/05/2017

## 2017-01-05 NOTE — Progress Notes (Signed)
[facility] text-paged regarding pt's persisting N/V after trying PRN Zofran. Awaiting response.

## 2017-01-05 NOTE — Plan of Care (Signed)
Problem: Activity: Goal: Risk for activity intolerance will decrease Outcome: Not Progressing Pt continues to refused to ambulate and sitting up in the chair. Educated pt on the importance of increasing mobility

## 2017-01-05 NOTE — Progress Notes (Signed)
Pharmacy called, Compazine requested again. Pt continues to be nauseous, but no persistent vomiting at this time. Will continue to monitor.

## 2017-01-05 NOTE — Progress Notes (Signed)
Prochlorperazine 10mg  one-time injection ordered. Requested from pharmacy. Awaiting arrival.

## 2017-01-05 NOTE — Progress Notes (Addendum)
Progress Note    [name]  FOY:[id number] DOB: [birth year]  DOA: 12/12/2016 PCP: [name], MD    Brief Narrative:   Chief complaint: Follow-up pneumonia/empyema/C. difficile colitis  [name] is an 51 y.o. female with a PMH of 45 pack year history of tobacco abuse, asthma, allergic rhinitis, COPD, hypertension and bipolar disorder who was admitted by the critical care team 12/12/16 with acute hypoxic respiratory failure, asthma exacerbation and multilobar pneumonia. She was also encephalopathic on admission. Her hospital course has been complicated by the development of C. difficile colitis, Streptococcus pneumonia lung abscess/empyema status post VATS on 12/26/16, ileus, pneumothorax, VDRF requiring intubation (intubated 12/12/16-12/21/16, reintubated 12/26/16-12/29/16), and demand ischemia. Patient transferred to the care of [facility] on 01/03/17.  Culture data is as follows: 1/19 legionella: negative 1/19 blood cx: neg 1/20 mrsa pcr: neg 1/20 rvp: neg 1/20 resp cx: abundant strep pneumo 1/20 strep pneumo: positive 1/22 HSV swab: HSV1 positive 1/24 BCx: neg 1/28 c diff + 2/2 bronch washing- few candida albicans, dubliniensis 2/2 LLL lung, fungal cx: candida albicans 2/2 LLL wound cx, deep: few candida albicans, rare MRSE  Assessment/Plan:   Principal Problem:   Acute respiratory failure with hypoxia ([place]), Multifactorial Required intubation 12/12/16-12/21/16 with reintubation 12/26/16 through 12/29/16. Continue to wean oxygen. Continue aggressive pulmonary toilet. Continue duo nebs every 6 hours and Pulmicort twice a day. Respiratory status remains stable.    Pneumonia with cavity of lung/Abscess of lower lobe of left lung with pneumonia (HCC)/Empyema of pleural space (HCC)/Pneumonia of left upper lobe due to Streptococcus pneumoniae (HCC)/HCAP/pneumothorax/sepsis Initial respiratory cultures grew strep pneumonia with a positive strep pneumonia antigen. Status post VATS 12/26/16.  Residual chest tube in place with persistent air leak. OR cultures + staph epidermidis. BAL cultures + for Candida. Continue vancomycin for MRSE, and Diflucan for Candida. Dr. Megan Salon of ID following for recommendations regarding duration of therapy. Chest x-ray today shows stable findings and no pneumothorax.    Active Problems:   Acute encephalopathy secondary to sepsis/bipolar disorder Continue to minimize narcotics. Home medications including Neurontin, Klonopin, Lexapro, Lamictal, Seroquel have been resumed.     Dysphagia/ileus Initially managed with TPN given ileus. Being followed by speech therapist. Modified barium swallow done 01/02/17. Diet subsequently advanced to regular with thin liquids.    Pressure injury of skin Skin care per nursing.    Acute kidney injury ([place]) Resolved.    Hypertension Continue scheduled Lopressor and when necessary labetalol.    Transaminitis Resolved.    Enteritis due to Clostridium difficile/abdominal distention Initially treated with vancomycin enemas and IV Flagyl. Continue oral vancomycin. Flexiseal in place.    Physical deconditioning Physical therapy following. SNF recommended but patient is declining at present. Says she has 24-hour care available.    Normocytic anemia Likely anemia of chronic disease/inflammation. Hemoglobin stable.    Hypomagnesemia Replaced.   Family Communication/Anticipated D/C date and plan/Code Status   DVT prophylaxis: Lovenox ordered. Code Status: Full Code.  Family Communication: No family present at the bedside. Daughter updated by telephone. Disposition Plan: Once to be discharged home although PT recommends SNF.   Medical Consultants:    CVTS  PCCM  Neurology  ID   Procedures:    VATS2/2/18  Anti-Infectives:   2/6 vanc>> 2/3 eraxis>>2/6 2/2 zosyn>>2/7 1/29 flagyl>> 2/2; resumed 2/3>> 1/30 vanc enema>>2/7 Fluconazole PO 2/7>> Vanc PO 2/8>> 1/19 ceftriaxone > 1/24 1/19  azithromycin > 1/22 1/22 acyclovir > 1/29 1/24 unasyn >>2/2  Subjective:   The  patient Denies current chest pain or shortness of breath. Says her appetite is good and that her bowels are moving. Reports that her voice is hoarse and is hoping that this will improve when her chest tube is removed.  Objective:    Vitals:   01/05/17 0900 01/05/17 1000 01/05/17 1100 01/05/17 1200  BP: (!) 110/99 114/74 136/81 107/63  Pulse: (!) 105 (!) 103 (!) 106 71  Resp: _0 (!) 8  Temp:   98.6 F (37 C)   TempSrc:   Oral   SpO2: 98% 93% 97% 99%  Weight:      Height:        Intake/Output Summary (Last 24 hours) at 01/05/17 1208 Last data filed at 01/05/17 1200  Gross per 24 hour  Intake             1350 ml  Output             1026 ml  Net              324 ml   Filed Weights   01/02/17 0600 01/04/17 0500 01/05/17 0454  Weight: 70 kg (154 lb 5.2 oz) 74.2 kg (163 lb 9.3 oz) 71 kg (156 lb 8.4 oz)    Exam: General exam: Appears older than her stated age, asleep. Respiratory system: Diminished breath sounds. Chest tube left flank. Cardiovascular system: Regular rate, and rhythm. No JVD,  rubs, gallops or clicks. No murmurs. Gastrointestinal system: Abdomen is nondistended, soft and nontender. No organomegaly or masses felt. Normal bowel sounds heard. Central nervous system: Awake and alert. No focal neurological deficits. Extremities: No clubbing,  or cyanosis. No edema. Skin: No rashes, lesions or ulcers. Psychiatry: Judgement and insight appear impaired. Mood & affect flat.   Data Reviewed:   I have personally reviewed following labs and imaging studies:  Labs: Basic Metabolic Panel:  Recent Labs Lab 12/30/16 0400  12/31/16 0446 12/31/16 1613 01/01/17 0400 01/03/17 0345 01/04/17 0500  NA 140  < > 139 136 134* 137 137  K 3.6  < > 3.4* 3.6 3.4* 3.7 3.8  CL 97*  < > 93* 87* 89* 93* 95*  CO2 32  --  36*  --  36* 33* 31  GLUCOSE 115*  < > 138* 182* 129* 114* 109*  BUN 11  <  > _1 <5* <5*  CREATININE 0.44  < > 0.44 0.50 0.42* 0.50 0.79  CALCIUM 9.0  --  8.7*  --  8.8* 8.8* 9.2  MG 1.5*  --  1.5*  --  1.2*  --  1.9  PHOS 3.3  --   --   --  3.0  --   --   < > = values in this interval not displayed. GFR Estimated Creatinine Clearance: 75.9 mL/min (by C-G formula based on SCr of 0.79 mg/dL). Liver Function Tests:  Recent Labs Lab 01/01/17 0400  AST 12*  ALT 14  ALKPHOS 64  BILITOT 0.5  PROT 5.3*  ALBUMIN 2.1*    CBC:  Recent Labs Lab 12/29/16 1553 12/30/16 1543 12/31/16 1613 01/03/17 0345 01/04/17 0500  WBC  --   --   --  8.4 8.1  HGB 9.2* 11.9* 11.2* 9.5* 9.6*  HCT 27.0* 35.0* 33.0* 31.0* 31.5*  MCV  --   --   --  92.5 92.9  PLT  --   --   --  287 282   CBG:  Recent Labs Lab 01/03/17 2141 01/04/17 0817  01/04/17 1210 01/04/17 1558 01/04/17 1938  GLUCAP 102* 104* 94 146* 86    Microbiology Recent Results (from the past 240 hour(s))  Fungus Culture With Stain     Status: None (Preliminary result)   Collection Time: 12/26/16  2:32 PM  Result Value Ref Range Status   Fungus Stain Final report  Final    Comment: (NOTE) Performed At: [facility] [place], [place] [id number] Lindon Romp MD [id number]    Fungus (Mycology) Culture PENDING  Incomplete   Fungal Source FLUID  Final  Aerobic/Anaerobic Culture (surgical/deep wound)     Status: None   Collection Time: 12/26/16  2:32 PM  Result Value Ref Range Status   Specimen Description FLUID  Final   Special Requests PLEURAL, LEFT LUNG  Final   Gram Stain   Final    RARE WBC PRESENT, PREDOMINANTLY PMN NO ORGANISMS SEEN    Culture   Final    RARE CANDIDA ALBICANS RARE CANDIDA DUBLINIENSIS NO ANAEROBES ISOLATED    Report Status 12/31/2016 FINAL  Final  Acid Fast Smear (AFB)     Status: None   Collection Time: 12/26/16  2:32 PM  Result Value Ref Range Status   AFB Specimen Processing Concentration  Final   Acid Fast Smear Negative  Final     Comment: (NOTE) Performed At: [facility] [place], [place] [id number] Lindon Romp MD [id number]    Source (AFB) FLUID  Final  Fungus Culture Result     Status: None   Collection Time: 12/26/16  2:32 PM  Result Value Ref Range Status   Result 1 Comment  Final    Comment: (NOTE) KOH/Calcofluor preparation:  no fungus observed. Performed At: [facility] [place], [place] [id number] Lindon Romp MD [id number]   Culture, fungus without smear     Status: Abnormal (Preliminary result)   Collection Time: 12/26/16  2:53 PM  Result Value Ref Range Status   Specimen Description LUNG  Final   Special Requests LEFT LOWER LUNG  Final   Culture CANDIDA ALBICANS (A)  Final   Report Status PENDING  Incomplete  Aerobic/Anaerobic Culture (surgical/deep wound)     Status: None   Collection Time: 12/26/16  2:53 PM  Result Value Ref Range Status   Specimen Description LUNG  Final   Special Requests LEFT LOWER LUNG  Final   Gram Stain   Final    ABUNDANT WBC PRESENT, PREDOMINANTLY PMN MODERATE GRAM POSITIVE COCCI IN PAIRS MODERATE YEAST MODERATE GRAM POSITIVE RODS    Culture   Final    FEW CANDIDA ALBICANS RARE STAPHYLOCOCCUS EPIDERMIDIS NO ANAEROBES ISOLATED    Report Status 12/31/2016 FINAL  Final   Organism ID, Bacteria STAPHYLOCOCCUS EPIDERMIDIS  Final      Susceptibility   Staphylococcus epidermidis - MIC*    CIPROFLOXACIN >=8 RESISTANT Resistant     ERYTHROMYCIN >=8 RESISTANT Resistant     GENTAMICIN <=0.5 SENSITIVE Sensitive     OXACILLIN >=4 RESISTANT Resistant     TETRACYCLINE <=1 SENSITIVE Sensitive     VANCOMYCIN 2 SENSITIVE Sensitive     TRIMETH/SULFA 80 RESISTANT Resistant     CLINDAMYCIN >=8 RESISTANT Resistant     RIFAMPIN <=0.5 SENSITIVE Sensitive     Inducible Clindamycin NEGATIVE Sensitive     * RARE STAPHYLOCOCCUS EPIDERMIDIS  Acid Fast Smear (AFB)     Status: None   Collection Time: 12/26/16  2:53 PM    Result Value Ref  Range Status   AFB Specimen Processing Concentration  Final   Acid Fast Smear Negative  Final    Comment: (NOTE) Performed At: [facility] [place], [place] [id number] Lindon Romp MD [id number]    Source (AFB) LEFT LOWER LUNG  Final  Aerobic/Anaerobic Culture (surgical/deep wound)     Status: None   Collection Time: 12/26/16  4:25 PM  Result Value Ref Range Status   Specimen Description BRONCHIAL WASHINGS  Final   Special Requests LEFT LUNG  Final   Gram Stain   Final    ABUNDANT WBC PRESENT, PREDOMINANTLY PMN RARE YEAST    Culture   Final    FEW CANDIDA ALBICANS FEW CANDIDA DUBLINIENSIS NO ANAEROBES ISOLATED    Report Status 12/31/2016 FINAL  Final    Radiology: Dg Chest Port 1 View  Result Date: 01/05/2017 CLINICAL DATA:  Chest pain EXAM: PORTABLE CHEST 1 VIEW COMPARISON:  January 04, 2017 FINDINGS: Central catheter tip is in the superior vena cava. There is a chest tube on the left. There is no evident pneumothorax. There is atelectatic change in the left mid lung and lateral right base regions. No new opacity evident. Heart size and pulmonary vascularity are normal. No adenopathy. There is degenerative change in the right shoulder. IMPRESSION: Tube and catheter positions as described without pneumothorax. Areas of atelectatic change bilaterally. No new opacity. Stable cardiac silhouette. Electronically Signed   By: Lowella Grip III M.D.   On: 01/05/2017 07:10   Dg Chest Port 1 View  Result Date: 01/04/2017 CLINICAL DATA:  Empyema EXAM: PORTABLE CHEST 1 VIEW COMPARISON:  01/03/2017 FINDINGS: Support devices including left chest tube are unchanged. No visible pneumothorax. No confluent opacities. Heart is borderline in size. IMPRESSION: No change.  No pneumothorax. Electronically Signed   By: Rolm Baptise M.D.   On: 01/04/2017 08:45   CXR 1/19>> Consolidations bilaterally CT head 1/19>> Left occipital lobe probable acute or  early subacute infarction. No acute intracranial hemorrhage. 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 There is a small amount of fluid in this structure. Also a 1 cm nodular structure in the left lower lobe with minimal cavitation.  Abd xr 1/27 >> bowel distended with gas CT Chest 2/1 >> Moderate left hydropneumothorax, no change in size of pulmonary abscess in the lingula, interval increase in loculated fluid at the left lung base with scattered air pockets and lower lobe atelectasis  Medications:   . bisacodyl  10 mg Oral Daily  . budesonide (PULMICORT) nebulizer solution  0.5 mg Nebulization BID  . Chlorhexidine Gluconate Cloth  6 each Topical Daily  . enoxaparin (LOVENOX) injection  40 mg Subcutaneous Q24H  . escitalopram  40 mg Oral Daily  . feeding supplement (ENSURE ENLIVE)  237 mL Oral TID BM  . fluconazole  400 mg Oral Daily  . gabapentin  300 mg Oral TID  . lamoTRIgine  100 mg Oral BID  . mouth rinse  15 mL Mouth Rinse BID  . metoprolol tartrate  25 mg Oral BID  . pantoprazole  40 mg Oral Q1200  . QUEtiapine  100 mg Oral QHS  . senna-docusate  1 tablet Oral QHS  . sodium chloride flush  10-40 mL Intracatheter Q12H  . vancomycin  250 mg Oral Q6H   Continuous Infusions: . dextrose 5 % and 0.45% NaCl 10 mL/hr at 01/05/17 1200    Medical decision making is of high complexity and this patient is at high risk of deterioration, therefore this is a level 3 visit.  (> 4 problem points, 2 data points, high risk)    LOS: 24 days   Braden Deloach  [facility] Pager [phone]. If unable to reach me by pager, please call my cell phone at [phone].  *Please refer to [email or web address], password [email or web address] to get updated schedule on who will round on this patient, as hospitalists  switch teams weekly. If 7PM-7AM, please contact night-coverage at [email or web address], password [email or web address] for any overnight needs.  01/05/2017, 12:08 PM

## 2017-01-05 NOTE — Progress Notes (Signed)
Pharmacy Antibiotic Note  [name] is a 51 y.o. female admitted on 12/12/2016 with MRSE empyema to continue on IV vancomycin.  Patient is also on fluconazole for C.albican and PO vancomycin for C.diff.  Patient's SCr doubled and her vancomycin random level is supra-therapeutic at 50 mcg/mL.  She has adequate urine output at 0.7 ml/kg/hr.   Plan: - Continue to hold vancomycin.  Repeat level in AM for patient specific kinetic. - BMET in AM - Monitor clinical progress, c/s, renal function, abx plan/LOT   Height: 5' 1.5" (156.2 cm) Weight: 156 lb 8.4 oz (71 kg) IBW/kg (Calculated) : 48.95  Temp (24hrs), Avg:98.1 F (36.7 C), Min:97.4 F (36.3 C), Max:98.9 F (37.2 C)   Recent Labs Lab 12/31/16 0446 12/31/16 1613 01/01/17 0400 01/02/17 0244 01/03/17 0345 01/04/17 0500 01/04/17 1510 01/05/17 0443  WBC  --   --   --   --  8.4 8.1  --   --   CREATININE 0.44 0.50 0.42*  --  0.50 0.79  --   --   VANCOTROUGH  --   --   --  10*  --   --  44*  --   VANCORANDOM  --   --   --   --   --   --   --  50    Estimated Creatinine Clearance: 75.9 mL/min (by C-G formula based on SCr of 0.79 mg/dL).    Allergies  Allergen Reactions  . Cymbalta [Duloxetine Hcl] Other (See Comments)    Mood changes...mean  . Mucinex [Guaifenesin Er] Other (See Comments)    Possible allergy, per patient (??)  . Nyquil Multi-Symptom [Pseudoeph-Doxylamine-Dm-Apap] Hives  . Paxil [Paroxetine Hcl] Other (See Comments)    Mean and aggresive  . Promethazine Itching  . Tramadol Itching  . Wellbutrin [Bupropion] Other (See Comments)    Rapid heartrate  . Zoloft [Sertraline] Other (See Comments)    Anger     2/6 vanc>> 2/3 eraxis>>2/6 2/2 zosyn>>2/7 1/29 flagyl>> 2/2; resumed 2/3>>2/8 1/30 vanc enema >> 2/7 Fluc 2/7 >> (14 days of total tx post-op) Vanc PO 2/8 >> (should stop after 2/21 doses)  2/9 VT = ~8 mcg/mL on 1g q12 > 1g q8 2/11 VT 1500 = 44 on 1g q8 >> held  2/12 VT 0500 = 50  mcg/mL  1/19 ceftriaxone > 1/24 1/19 azithromycin > 1/22 1/22 acyclovir > 1/29 1/24 unasyn >>2/2  2/2 bronch washing- few candida albicans, dubliniensis 2/2 LLL lung, fungal cx: candida albicans 2/2 LLL wound cx, deep: few candida albicans, rare MRSE 1/28 c diff - positive 1/19 legionella: negative 1/19 blood cx: neg 1/20 mrsa pcr: neg 1/20 rvp: neg 1/20 resp cx: abundant Strep pneumo 1/20 strep pneumo: positive 1/22 HSV swab: HSV1 positive 1/24 BCx: neg    [name] D. Mina Marble, PharmD, BCPS Pager:  [phone] 01/05/2017, 11:38 AM

## 2017-01-05 NOTE — Progress Notes (Signed)
CT surgery p.m. Rounds  Follow-up after left VATS for empyema, debridement of lung abscess-MRSA No airleak this evening Patient walking hallway Chest x-ray in a.m. and possibly pull tube Continue IV antibiotics for now

## 2017-01-05 NOTE — Progress Notes (Signed)
Subjective: No new complaints   Antibiotics:  Anti-infectives    Start     Dose/Rate Route Frequency Ordered Stop   01/03/17 1000  fluconazole (DIFLUCAN) tablet 400 mg     400 mg Oral Daily 01/02/17 0947     01/02/17 0800  vancomycin (VANCOCIN) IVPB 1000 mg/200 mL premix  Status:  Discontinued     1,000 mg 200 mL/hr over 60 Minutes Intravenous Every 8 hours 01/02/17 0351 01/04/17 1628   01/01/17 0900  vancomycin (VANCOCIN) 50 mg/mL oral solution 250 mg     250 mg Oral Every 6 hours 01/01/17 0822     12/31/16 1000  fluconazole (DIFLUCAN) tablet 200 mg  Status:  Discontinued     200 mg Oral Daily 12/31/16 0831 01/02/17 0947   12/31/16 0845  metroNIDAZOLE (FLAGYL) tablet 500 mg  Status:  Discontinued     500 mg Oral Every 8 hours 12/31/16 0831 01/01/17 0822   12/30/16 1445  vancomycin (VANCOCIN) IVPB 1000 mg/200 mL premix  Status:  Discontinued     1,000 mg 200 mL/hr over 60 Minutes Intravenous Every 12 hours 12/30/16 1449 01/02/17 0351   12/28/16 1130  anidulafungin (ERAXIS) 100 mg in sodium chloride 0.9 % 100 mL IVPB  Status:  Discontinued     100 mg over 90 Minutes Intravenous Every 24 hours 12/27/16 1044 12/31/16 0840   12/27/16 1130  anidulafungin (ERAXIS) 200 mg in sodium chloride 0.9 % 200 mL IVPB     200 mg over 180 Minutes Intravenous  Once 12/27/16 1044 12/27/16 1807   12/27/16 1015  micafungin (MYCAMINE) injection 100 mg  Status:  Discontinued     100 mg Intravenous Every 24 hours 12/27/16 1006 12/27/16 1042   12/27/16 0000  metroNIDAZOLE (FLAGYL) IVPB 500 mg  Status:  Discontinued     500 mg 100 mL/hr over 60 Minutes Intravenous Every 8 hours 12/26/16 1918 12/31/16 0831   12/26/16 2000  piperacillin-tazobactam (ZOSYN) IVPB 3.375 g  Status:  Discontinued     3.375 g 12.5 mL/hr over 240 Minutes Intravenous Every 8 hours 12/26/16 1925 12/31/16 0831   12/23/16 0130  vancomycin (VANCOCIN) 500 mg in sodium chloride irrigation 0.9 % 100 mL ENEMA  Status:   Discontinued     500 mg Rectal Every 6 hours 12/23/16 0049 12/31/16 0831   12/22/16 2354  metroNIDAZOLE (FLAGYL) IVPB 500 mg  Status:  Discontinued     500 mg 100 mL/hr over 60 Minutes Intravenous Every 8 hours 12/22/16 2354 12/26/16 1918   12/22/16 2200  vancomycin (VANCOCIN) 50 mg/mL oral solution 125 mg  Status:  Discontinued     125 mg Oral 4 times daily 12/22/16 2058 12/23/16 0049   12/22/16 1000  vancomycin (VANCOCIN) 50 mg/mL oral solution 125 mg  Status:  Discontinued     125 mg Per Tube 4 times daily 12/22/16 0947 12/22/16 2058   12/17/16 1200  Ampicillin-Sulbactam (UNASYN) 3 g in sodium chloride 0.9 % 100 mL IVPB  Status:  Discontinued     3 g 200 mL/hr over 30 Minutes Intravenous Every 6 hours 12/17/16 1144 12/26/16 1925   12/15/16 1015  acyclovir (ZOVIRAX) 200 MG/5ML suspension SUSP 400 mg     400 mg Per Tube 3 times daily 12/15/16 1000 12/22/16 0959   12/13/16 2000  azithromycin (ZITHROMAX) 500 mg in dextrose 5 % 250 mL IVPB  Status:  Discontinued     500 mg 250 mL/hr over 60 Minutes Intravenous Every  24 hours 12/12/16 2241 12/15/16 0957   12/13/16 2000  cefTRIAXone (ROCEPHIN) 1 g in dextrose 5 % 50 mL IVPB  Status:  Discontinued     1 g 100 mL/hr over 30 Minutes Intravenous Every 24 hours 12/12/16 2241 12/17/16 1144   12/12/16 2015  cefTRIAXone (ROCEPHIN) 1 g in dextrose 5 % 50 mL IVPB     1 g 100 mL/hr over 30 Minutes Intravenous  Once 12/12/16 2013 12/12/16 2110   12/12/16 2015  azithromycin (ZITHROMAX) 500 mg in dextrose 5 % 250 mL IVPB     500 mg 250 mL/hr over 60 Minutes Intravenous  Once 12/12/16 2013 12/12/16 2225      Medications: Scheduled Meds: . bisacodyl  10 mg Oral Daily  . budesonide (PULMICORT) nebulizer solution  0.5 mg Nebulization BID  . Chlorhexidine Gluconate Cloth  6 each Topical Daily  . enoxaparin (LOVENOX) injection  40 mg Subcutaneous Q24H  . escitalopram  40 mg Oral Daily  . feeding supplement (ENSURE ENLIVE)  237 mL Oral TID BM  .  fluconazole  400 mg Oral Daily  . gabapentin  300 mg Oral TID  . lamoTRIgine  100 mg Oral BID  . mouth rinse  15 mL Mouth Rinse BID  . metoprolol tartrate  25 mg Oral BID  . pantoprazole  40 mg Oral Q1200  . QUEtiapine  100 mg Oral QHS  . senna-docusate  1 tablet Oral QHS  . sodium chloride flush  10-40 mL Intracatheter Q12H  . vancomycin  250 mg Oral Q6H   Continuous Infusions: . dextrose 5 % and 0.45% NaCl 10 mL/hr at 01/05/17 1400   PRN Meds:.diphenhydrAMINE, Gerhardt's butt cream, ipratropium-albuterol, labetalol, ondansetron (ZOFRAN) IV, oxyCODONE, potassium chloride (KCL MULTIRUN) 30 mEq in 265 mL IVPB, sodium chloride flush    Objective: Weight change: -7 lb 0.9 oz (-3.2 kg)  Intake/Output Summary (Last 24 hours) at 01/05/17 1429 Last data filed at 01/05/17 1400  Gross per 24 hour  Intake             1350 ml  Output             1026 ml  Net              324 ml   Blood pressure 122/81, pulse 70, temperature 98.6 F (37 C), temperature source Oral, resp. rate 15, height 5' 1.5" (1.562 m), weight 156 lb 8.4 oz (71 kg), SpO2 100 %. Temp:  [97.7 F (36.5 C)-98.9 F (37.2 C)] 98.6 F (37 C) (02/12 1100) Pulse Rate:  [67-106] 70 (02/12 1400) Resp:  [7-20] 15 (02/12 1400) BP: (91-159)/(59-133) 122/81 (02/12 1400) SpO2:  [91 %-100 %] 100 % (02/12 1400) Weight:  [156 lb 8.4 oz (71 kg)] 156 lb 8.4 oz (71 kg) (02/12 0454)  Physical Exam: General: Alert and awake, Not speaking much waves her hand hello HEENT: anicteric sclera, , EOMI CVS regular rate, normal r,  no murmur rubs or gallops Chest: clear to auscultation bilaterally anteriorly, no wheezing, rales or rhonchi Abdomen: soft nontender, nondistended, normal bowel sounds, Skin: no rashes Neuro: nonfocal  CBC: CBC Latest Ref Rng & Units 01/04/2017 01/03/2017 12/31/2016  WBC 4.0 - 10.5 K/uL 8.1 8.4 -  Hemoglobin 12.0 - 15.0 g/dL 9.6(L) 9.5(L) 11.2(L)  Hematocrit 36.0 - 46.0 % 31.5(L) 31.0(L) 33.0(L)  Platelets 150 -  400 K/uL 282 287 -      BMET  Recent Labs  01/03/17 0345 01/04/17 0500  NA 137 137  K  3.7 3.8  CL 93* 95*  CO2 33* 31  GLUCOSE 114* 109*  BUN <5* <5*  CREATININE 0.50 0.79  CALCIUM 8.8* 9.2     Liver Panel  No results for input(s): PROT, ALBUMIN, AST, ALT, ALKPHOS, BILITOT, BILIDIR, IBILI in the last 72 hours.     Sedimentation Rate No results for input(s): ESRSEDRATE in the last 72 hours. C-Reactive Protein No results for input(s): CRP in the last 72 hours.  Micro Results: Recent Results (from the past 720 hour(s))  Culture, blood (routine x 2)     Status: None   Collection Time: 12/12/16  8:07 PM  Result Value Ref Range Status   Specimen Description BLOOD RIGHT HAND  Final   Special Requests BOTTLES DRAWN AEROBIC AND ANAEROBIC 5CC  Final   Culture NO GROWTH 5 DAYS  Final   Report Status 12/17/2016 FINAL  Final  Culture, blood (routine x 2)     Status: None   Collection Time: 12/12/16  8:43 PM  Result Value Ref Range Status   Specimen Description BLOOD LEFT FOREARM  Final   Special Requests BOTTLES DRAWN AEROBIC AND ANAEROBIC 5CC  Final   Culture NO GROWTH 5 DAYS  Final   Report Status 12/17/2016 FINAL  Final  Respiratory Panel by PCR     Status: None   Collection Time: 12/13/16 12:01 AM  Result Value Ref Range Status   Adenovirus NOT DETECTED NOT DETECTED Final   Coronavirus 229E NOT DETECTED NOT DETECTED Final   Coronavirus HKU1 NOT DETECTED NOT DETECTED Final   Coronavirus NL63 NOT DETECTED NOT DETECTED Final   Coronavirus OC43 NOT DETECTED NOT DETECTED Final   Metapneumovirus NOT DETECTED NOT DETECTED Final   Rhinovirus / Enterovirus NOT DETECTED NOT DETECTED Final   Influenza A NOT DETECTED NOT DETECTED Final   Influenza B NOT DETECTED NOT DETECTED Final   Parainfluenza Virus 1 NOT DETECTED NOT DETECTED Final   Parainfluenza Virus 2 NOT DETECTED NOT DETECTED Final   Parainfluenza Virus 3 NOT DETECTED NOT DETECTED Final   Parainfluenza Virus 4 NOT  DETECTED NOT DETECTED Final   Respiratory Syncytial Virus NOT DETECTED NOT DETECTED Final   Bordetella pertussis NOT DETECTED NOT DETECTED Final   Chlamydophila pneumoniae NOT DETECTED NOT DETECTED Final   Mycoplasma pneumoniae NOT DETECTED NOT DETECTED Final  MRSA PCR Screening     Status: None   Collection Time: 12/13/16 12:03 AM  Result Value Ref Range Status   MRSA by PCR NEGATIVE NEGATIVE Final    Comment:        The GeneXpert MRSA Assay (FDA approved for NASAL specimens only), is one component of a comprehensive MRSA colonization surveillance program. It is not intended to diagnose MRSA infection nor to guide or monitor treatment for MRSA infections.   Culture, respiratory (NON-Expectorated)     Status: None   Collection Time: 12/13/16 12:14 AM  Result Value Ref Range Status   Specimen Description TRACHEAL ASPIRATE  Final   Special Requests NONE  Final   Gram Stain   Final    MODERATE WBC PRESENT, PREDOMINANTLY PMN RARE SQUAMOUS EPITHELIAL CELLS PRESENT ABUNDANT GRAM POSITIVE COCCI IN PAIRS ABUNDANT GRAM NEGATIVE COCCOBACILLI MODERATE GRAM POSITIVE RODS    Culture ABUNDANT STREPTOCOCCUS PNEUMONIAE  Final   Report Status 12/15/2016 FINAL  Final   Organism ID, Bacteria STREPTOCOCCUS PNEUMONIAE  Final      Susceptibility   Streptococcus pneumoniae - MIC*    ERYTHROMYCIN >=8 RESISTANT Resistant     LEVOFLOXACIN  1 SENSITIVE Sensitive     PENICILLIN Value in next row Sensitive      SENSITIVE<=0.06    CEFTRIAXONE Value in next row Sensitive      SENSITIVE<=0.12    * ABUNDANT STREPTOCOCCUS PNEUMONIAE  Hsv Culture And Typing     Status: Abnormal   Collection Time: 12/15/16  3:23 PM  Result Value Ref Range Status   HSV Culture/Type Comment (A)  Final    Comment: (NOTE) Positive for Herpes simplex virus type-1. Typing was confirmed by monoclonal antibody microscopic immunofluorescence. Performed At: [facility] [place], [place] [id number] Lindon Romp MD [id number]    Source of Sample ORAL  Final  Culture, blood (routine x 2)     Status: None   Collection Time: 12/17/16  4:58 PM  Result Value Ref Range Status   Specimen Description BLOOD RIGHT ARM  Final   Special Requests IN PEDIATRIC BOTTLE [place]  Final   Culture NO GROWTH 5 DAYS  Final   Report Status 12/22/2016 FINAL  Final  Culture, blood (routine x 2)     Status: None   Collection Time: 12/17/16  5:05 PM  Result Value Ref Range Status   Specimen Description BLOOD RIGHT HAND  Final   Special Requests IN PEDIATRIC BOTTLE 3CC  Final   Culture NO GROWTH 5 DAYS  Final   Report Status 12/22/2016 FINAL  Final  C difficile quick scan w PCR reflex     Status: Abnormal   Collection Time: 12/21/16  6:25 PM  Result Value Ref Range Status   C Diff antigen POSITIVE (A) NEGATIVE Final   C Diff toxin NEGATIVE NEGATIVE Final   C Diff interpretation Results are indeterminate. See PCR results.  Final  Clostridium Difficile by PCR     Status: Abnormal   Collection Time: 12/21/16  6:25 PM  Result Value Ref Range Status   Toxigenic C Difficile by pcr POSITIVE (A) NEGATIVE Final    Comment: Positive for toxigenic C. difficile with little to no toxin production. Only treat if clinical presentation suggests symptomatic illness.  Fungus Culture With Stain     Status: None (Preliminary result)   Collection Time: 12/26/16  2:32 PM  Result Value Ref Range Status   Fungus Stain Final report  Final    Comment: (NOTE) Performed At: [facility] [place], [place] [id number] Lindon Romp MD [id number]    Fungus (Mycology) Culture PENDING  Incomplete   Fungal Source FLUID  Final  Aerobic/Anaerobic Culture (surgical/deep wound)     Status: None   Collection Time: 12/26/16  2:32 PM  Result Value Ref Range Status   Specimen Description FLUID  Final   Special Requests PLEURAL, LEFT LUNG  Final   Gram Stain   Final    RARE WBC PRESENT, PREDOMINANTLY PMN NO  ORGANISMS SEEN    Culture   Final    RARE CANDIDA ALBICANS RARE CANDIDA DUBLINIENSIS NO ANAEROBES ISOLATED    Report Status 12/31/2016 FINAL  Final  Acid Fast Smear (AFB)     Status: None   Collection Time: 12/26/16  2:32 PM  Result Value Ref Range Status   AFB Specimen Processing Concentration  Final   Acid Fast Smear Negative  Final    Comment: (NOTE) Performed At: [facility] [place], [place] [id number] Lindon Romp MD [id number]    Source (AFB) FLUID  Final  Fungus Culture Result     Status: None  Collection Time: 12/26/16  2:32 PM  Result Value Ref Range Status   Result 1 Comment  Final    Comment: (NOTE) KOH/Calcofluor preparation:  no fungus observed. Performed At: [facility] [place], [place] [id number] Lindon Romp MD [id number]   Culture, fungus without smear     Status: Abnormal (Preliminary result)   Collection Time: 12/26/16  2:53 PM  Result Value Ref Range Status   Specimen Description LUNG  Final   Special Requests LEFT LOWER LUNG  Final   Culture CANDIDA ALBICANS (A)  Final   Report Status PENDING  Incomplete  Aerobic/Anaerobic Culture (surgical/deep wound)     Status: None   Collection Time: 12/26/16  2:53 PM  Result Value Ref Range Status   Specimen Description LUNG  Final   Special Requests LEFT LOWER LUNG  Final   Gram Stain   Final    ABUNDANT WBC PRESENT, PREDOMINANTLY PMN MODERATE GRAM POSITIVE COCCI IN PAIRS MODERATE YEAST MODERATE GRAM POSITIVE RODS    Culture   Final    FEW CANDIDA ALBICANS RARE STAPHYLOCOCCUS EPIDERMIDIS NO ANAEROBES ISOLATED    Report Status 12/31/2016 FINAL  Final   Organism ID, Bacteria STAPHYLOCOCCUS EPIDERMIDIS  Final      Susceptibility   Staphylococcus epidermidis - MIC*    CIPROFLOXACIN >=8 RESISTANT Resistant     ERYTHROMYCIN >=8 RESISTANT Resistant     GENTAMICIN <=0.5 SENSITIVE Sensitive     OXACILLIN >=4 RESISTANT Resistant     TETRACYCLINE  <=1 SENSITIVE Sensitive     VANCOMYCIN 2 SENSITIVE Sensitive     TRIMETH/SULFA 80 RESISTANT Resistant     CLINDAMYCIN >=8 RESISTANT Resistant     RIFAMPIN <=0.5 SENSITIVE Sensitive     Inducible Clindamycin NEGATIVE Sensitive     * RARE STAPHYLOCOCCUS EPIDERMIDIS  Acid Fast Smear (AFB)     Status: None   Collection Time: 12/26/16  2:53 PM  Result Value Ref Range Status   AFB Specimen Processing Concentration  Final   Acid Fast Smear Negative  Final    Comment: (NOTE) Performed At: [facility] [place], [place] [id number] Lindon Romp MD [id number]    Source (AFB) LEFT LOWER LUNG  Final  Aerobic/Anaerobic Culture (surgical/deep wound)     Status: None   Collection Time: 12/26/16  4:25 PM  Result Value Ref Range Status   Specimen Description BRONCHIAL WASHINGS  Final   Special Requests LEFT LUNG  Final   Gram Stain   Final    ABUNDANT WBC PRESENT, PREDOMINANTLY PMN RARE YEAST    Culture   Final    FEW CANDIDA ALBICANS FEW CANDIDA DUBLINIENSIS NO ANAEROBES ISOLATED    Report Status 12/31/2016 FINAL  Final    Studies/Results: Dg Chest Port 1 View  Result Date: 01/05/2017 CLINICAL DATA:  Chest pain EXAM: PORTABLE CHEST 1 VIEW COMPARISON:  January 04, 2017 FINDINGS: Central catheter tip is in the superior vena cava. There is a chest tube on the left. There is no evident pneumothorax. There is atelectatic change in the left mid lung and lateral right base regions. No new opacity evident. Heart size and pulmonary vascularity are normal. No adenopathy. There is degenerative change in the right shoulder. IMPRESSION: Tube and catheter positions as described without pneumothorax. Areas of atelectatic change bilaterally. No new opacity. Stable cardiac silhouette. Electronically Signed   By: Lowella Grip III M.D.   On: 01/05/2017 07:10   Dg Chest Port 1 View  Result Date: 01/04/2017  CLINICAL DATA:  Empyema EXAM: PORTABLE CHEST 1 VIEW COMPARISON:   01/03/2017 FINDINGS: Support devices including left chest tube are unchanged. No visible pneumothorax. No confluent opacities. Heart is borderline in size. IMPRESSION: No change.  No pneumothorax. Electronically Signed   By: Rolm Baptise M.D.   On: 01/04/2017 08:45      Assessment/Plan:  INTERVAL HISTORY:   No new fevers   Principal Problem:   Acute respiratory failure with hypoxia (HCC) Active Problems:   Chronic low back pain   COPD with acute exacerbation (HCC)   IBS (irritable bowel syndrome)   Chronic narcotic dependence (HCC)   Cigarette nicotine dependence without complication   MDD (major depressive disorder), recurrent severe, without psychosis ([place])   Mixed hyperlipidemia   Pressure injury of skin   Acute kidney injury ([place])   Transaminitis   Ileus (HCC)   Pneumothorax   Pneumonia of left upper lobe due to Streptococcus pneumoniae (HCC)   Enteritis due to Clostridium difficile   Abscess of lower lobe of left lung with pneumonia ([place])   Empyema of pleural space ([place])   Asthma   Seasonal allergies   Normocytic anemia   Herpes labialis    [name] is a 51 y.o. female with  recent severe pneumococcal pneumonia. She does sit ups Y developed superimposed infection with Candida albicans and dubliensis + MR Co ag negative staph in pleural space + CDI  #1 Empyema: Complete 2 weeks of IV vancomycin and fluconazole with stop date being 01/15/17  #2 C. difficile colitis: Would extend her oral vancomycin 1 week beyond completion of parenteral antibiotics with stop date being 02/29/2018  I'll sign off for now please call with further questions.   LOS: 24 days   Alcide Evener 01/05/2017, 2:29 PM

## 2017-01-06 ENCOUNTER — Inpatient Hospital Stay (HOSPITAL_COMMUNITY): Payer: Medicaid Other

## 2017-01-06 DIAGNOSIS — Z9889 Other specified postprocedural states: Secondary | ICD-10-CM

## 2017-01-06 DIAGNOSIS — B379 Candidiasis, unspecified: Secondary | ICD-10-CM

## 2017-01-06 LAB — BASIC METABOLIC PANEL
ANION GAP: 12 (ref 5–15)
BUN: 9 mg/dL (ref 6–20)
CALCIUM: 8.9 mg/dL (ref 8.9–10.3)
CHLORIDE: 91 mmol/L — AB (ref 101–111)
CO2: 30 mmol/L (ref 22–32)
Creatinine, Ser: 2.39 mg/dL — ABNORMAL HIGH (ref 0.44–1.00)
GFR calc non Af Amer: 22 mL/min — ABNORMAL LOW (ref >60)
GFR, EST AFRICAN AMERICAN: 26 mL/min — AB (ref >60)
Glucose, Bld: 98 mg/dL (ref 65–99)
Potassium: 4.2 mmol/L (ref 3.5–5.1)
Sodium: 133 mmol/L — ABNORMAL LOW (ref 135–145)

## 2017-01-06 LAB — CBC
HCT: 30.2 % — ABNORMAL LOW (ref 36.0–46.0)
Hemoglobin: 9.1 g/dL — ABNORMAL LOW (ref 12.0–15.0)
MCH: 28.5 pg (ref 26.0–34.0)
MCHC: 30.1 g/dL (ref 30.0–36.0)
MCV: 94.7 fL (ref 78.0–100.0)
PLATELETS: 253 10*3/uL (ref 150–400)
RBC: 3.19 MIL/uL — AB (ref 3.87–5.11)
RDW: 15.1 % (ref 11.5–15.5)
WBC: 8.4 10*3/uL (ref 4.0–10.5)

## 2017-01-06 LAB — VANCOMYCIN, RANDOM: Vancomycin Rm: 29

## 2017-01-06 MED ORDER — SODIUM CHLORIDE 0.9 % IV BOLUS (SEPSIS)
1000.0000 mL | Freq: Once | INTRAVENOUS | Status: AC
  Administered 2017-01-06: 1000 mL via INTRAVENOUS

## 2017-01-06 MED ORDER — ENOXAPARIN SODIUM 30 MG/0.3ML ~~LOC~~ SOLN
30.0000 mg | SUBCUTANEOUS | Status: DC
  Administered 2017-01-06 – 2017-01-07 (×2): 30 mg via SUBCUTANEOUS
  Filled 2017-01-06 (×2): qty 0.3

## 2017-01-06 MED ORDER — NALOXONE HCL 0.4 MG/ML IJ SOLN
INTRAMUSCULAR | Status: AC
  Filled 2017-01-06: qty 1

## 2017-01-06 MED ORDER — FLUCONAZOLE 200 MG PO TABS
200.0000 mg | ORAL_TABLET | Freq: Every day | ORAL | Status: DC
  Administered 2017-01-07 – 2017-01-12 (×6): 200 mg via ORAL
  Filled 2017-01-06 (×6): qty 1

## 2017-01-06 MED ORDER — SODIUM CHLORIDE 0.9 % IV SOLN
INTRAVENOUS | Status: DC
  Administered 2017-01-07: 05:00:00 via INTRAVENOUS

## 2017-01-06 MED ORDER — ACETAMINOPHEN 325 MG PO TABS
650.0000 mg | ORAL_TABLET | Freq: Four times a day (QID) | ORAL | Status: DC | PRN
  Administered 2017-01-06 – 2017-01-12 (×6): 650 mg via ORAL
  Filled 2017-01-06 (×7): qty 2

## 2017-01-06 MED ORDER — NALOXONE HCL 0.4 MG/ML IJ SOLN
0.4000 mg | Freq: Once | INTRAMUSCULAR | Status: AC
  Administered 2017-01-06: 0.4 mg via INTRAVENOUS

## 2017-01-06 MED ORDER — NALOXONE HCL 0.4 MG/ML IJ SOLN
0.4000 mg | INTRAMUSCULAR | Status: DC | PRN
  Administered 2017-01-06: 0.4 mg via INTRAVENOUS
  Filled 2017-01-06: qty 1

## 2017-01-06 NOTE — Progress Notes (Addendum)
Pharmacy Antibiotic Note  [name] is a 51 y.o. female admitted on 12/12/2016 with MRSE empyema to continue on IV vancomycin.  Patient is also on fluconazole for C.albican and PO vancomycin for C.diff.  Patient's SCr continues to worsen (SCr 0.41 >> 2.39).  Her random vancomycin level remains supra-therapeutic but has trended down significantly.  Expect to resume vancomycin tomorrow AM based on patient's kinetic (ke = 0.02368, t1/2 = 29 hrs).   Plan: - Continue to hold vanc.  BMET in AM and resume vanc if renal function doesn't worsen. - Change fluconazole to 200mg  PO Q24H - Reduce Lovenox to 30mg  SQ Q24H - Monitor clinical progress, renal function   Height: 5' 1.5" (156.2 cm) Weight: 156 lb 8.4 oz (71 kg) IBW/kg (Calculated) : 48.95  Temp (24hrs), Avg:98.8 F (37.1 C), Min:98.4 F (36.9 C), Max:99.9 F (37.7 C)   Recent Labs Lab 12/31/16 1613 01/01/17 0400 01/02/17 0244 01/03/17 0345 01/04/17 0500 01/04/17 1510 01/05/17 0443 01/06/17 0400  WBC  --   --   --  8.4 8.1  --   --  8.4  CREATININE 0.50 0.42*  --  0.50 0.79  --   --  2.39*  VANCOTROUGH  --   --  10*  --   --  44*  --   --   VANCORANDOM  --   --   --   --   --   --  50 29    Estimated Creatinine Clearance: 25.4 mL/min (by C-G formula based on SCr of 2.39 mg/dL (H)).    Allergies  Allergen Reactions  . Cymbalta [Duloxetine Hcl] Other (See Comments)    Mood changes...mean  . Mucinex [Guaifenesin Er] Other (See Comments)    Possible allergy, per patient (??)  . Nyquil Multi-Symptom [Pseudoeph-Doxylamine-Dm-Apap] Hives  . Paxil [Paroxetine Hcl] Other (See Comments)    Mean and aggresive  . Promethazine Itching  . Tramadol Itching  . Wellbutrin [Bupropion] Other (See Comments)    Rapid heartrate  . Zoloft [Sertraline] Other (See Comments)    Anger     2/6 vanc>> 2/3 eraxis>>2/6 2/2 zosyn>>2/7 1/29 flagyl>> 2/2; resumed 2/3>>2/8 1/30 vanc enema >> 2/7 Fluc 2/7 >> (14 days of total tx  post-op) Vanc PO 2/8 >> (should stop after 2/21 doses)  2/9 VT = ~8 mcg/mL on 1g q12 > 1g q8 2/11 VT 1500 = 44 on 1g q8 >> held  2/12 VT 0500 = 50 mcg/mL 2/13 VR 0400 = 29 mcg/mL (ke = 0.02368, t1/2 = 29 hrs)  1/19 ceftriaxone > 1/24 1/19 azithromycin > 1/22 1/22 acyclovir > 1/29 1/24 unasyn >>2/2  2/2 bronch washing- few candida albicans, dubliniensis 2/2 LLL lung, fungal cx: candida albicans 2/2 LLL wound cx, deep: few candida albicans, rare MRSE 1/28 c diff - positive 1/19 legionella: negative 1/19 blood cx: neg 1/20 mrsa pcr: neg 1/20 rvp: neg 1/20 resp cx: abundant Strep pneumo 1/20 strep pneumo: positive 1/22 HSV swab: HSV1 positive 1/24 BCx: neg    Demetrus Pavao D. Mina Marble, PharmD, BCPS Pager:  [phone] 01/06/2017, 10:47 AM

## 2017-01-06 NOTE — Progress Notes (Signed)
Physical Therapy Treatment Patient Details Name: [name] MRN: [id number] DOB: 1965/12/27 Today's Date: 01/06/2017    History of Present Illness 51 y/o F, 38 pk year smoker, with Hx of COPD/asthma and bipolar disorder admitted 1/19 with SOB.  Working diagnosis of acute asthma exacerbation, multilobar PNA with resultant hypoxic respiratory failure.  Intubated 12/12/16 to 12/20/16.  Extubated 1/28. Pt transferred to step down on 1/29 with ileus. NG placed which pt pulled out.    PT Comments    Pt extremely lethargic and difficult to arouse today. Pt requires maxA x 2 for bed mobility, transfers and balance. Attempted bed exercises, but pt unable to participate due to difficulty with carrying out tasks and following commands. D/c plan continues to remain appropriate based upon physical assist required. Will continue to follow acutely.   Follow Up Recommendations  SNF;Supervision/Assistance - 24 hour     Equipment Recommendations  Rolling walker with 5" wheels    Recommendations for Other Services       Precautions / Restrictions Precautions Precautions: Fall Restrictions Weight Bearing Restrictions: No    Mobility  Bed Mobility Overal bed mobility: Needs Assistance Bed Mobility: Supine to Sit;Sit to Supine     Supine to sit: Max assist;+2 for physical assistance;HOB elevated Sit to supine: Max assist;+2 for physical assistance   General bed mobility comments: pt able to participate initially with repeated v/c but becomes lethargic and requires max A for moving B LE over EOB and for lifting trunk to upright  Transfers Overall transfer level: Needs assistance Equipment used: Rolling walker (2 wheeled) Transfers: Sit to/from [facility] Sit to Stand: Max assist;+2 physical assistance Stand pivot transfers: Max assist;+2 physical assistance       General transfer comment: max physical A for all transfers today due to inability of pt to maintain upright or  carry out tasks; pt with L lateral and posterior lean in standing; pt unable to achieve upright posture with stand; attempted sit to stand and stand pivot x 4  Ambulation/Gait             General Gait Details: unsafe to amb this session   Stairs            Wheelchair Mobility    Modified Rankin (Stroke Patients Only)       Balance Overall balance assessment: Needs assistance Sitting-balance support: Feet supported;Bilateral upper extremity supported Sitting balance-Leahy Scale: Zero Sitting balance - Comments: pt with L lateral and posterior lean with static sitting; sat EOB x 5 min Postural control: Posterior lean;Left lateral lean Standing balance support: Bilateral upper extremity supported Standing balance-Leahy Scale: Zero Standing balance comment: heavy reliance on RW for balance, L lateral lean with standing; requires maxA x 2 to maintain balance                    Cognition Arousal/Alertness: Lethargic Behavior During Therapy: Flat affect Overall Cognitive Status: Impaired/Different from baseline Area of Impairment: Orientation;Attention;Memory;Following commands;Safety/judgement;Awareness;Problem solving Orientation Level: Disoriented to;Place;Time;Situation (pt reports thinking she is in a hotel) Current Attention Level: Focused Memory: Decreased short-term memory Following Commands: Follows one step commands inconsistently;Follows one step commands with increased time Safety/Judgement: Decreased awareness of safety;Decreased awareness of deficits Awareness: Intellectual Problem Solving: Decreased initiation;Difficulty sequencing;Requires verbal cues;Requires tactile cues;Slow processing General Comments: diffcult to arouse, requires repeated v/c to stay on task, less verbal communication from pt than in previous sessions    Exercises General Exercises - Lower Extremity Quad Sets: AROM;Right (unable to get  pt to perform on L side)    General  Comments General comments (skin integrity, edema, etc.): chest tube intact      Pertinent Vitals/Pain Pain Assessment: No/denies pain    Home Living                      Prior Function            PT Goals (current goals can now be found in the care plan section) Acute Rehab PT Goals Patient Stated Goal: none stated Progress towards PT goals: Progressing toward goals    Frequency    Min 3X/week      PT Plan Current plan remains appropriate    Co-evaluation             End of Session Equipment Utilized During Treatment: Gait belt;Oxygen (5L O2 via [place]) Activity Tolerance: Patient limited by lethargy;Patient limited by fatigue Patient left: in bed;with bed alarm set;with call bell/phone within reach     Time: 0825-0849 PT Time Calculation (min) (ACUTE ONLY): 24 min  Charges:  $Therapeutic Activity: 23-37 mins                    G CodesTracie Harrier 2017-01-15, 11:21 AM  Tracie Harrier, SPT Acute Rehab SPT [phone]

## 2017-01-06 NOTE — Progress Notes (Signed)
01/06/2017 1340 Dr. Rockne Menghini paged and made aware of continued lethargy despite ordered interventions in place. Verbal order received to repeat IV Narcan x 1. Orders enacted. Will continue to closely monitor patient.  Riyan Gavina, Arville Lime

## 2017-01-06 NOTE — Progress Notes (Signed)
11 Days Post-Op Procedure(s) (LRB): LEFT VIDEO ASSISTED THORACOSCOPY (VATS)/DECORTICATION (Left) VIDEO BRONCHOSCOPY WITH BRONCHIAL LAVAGE (N/A) Subjective: MRSA empyema Last chest tube out today Chest incision clean,dry  Objective: Vital signs in last 24 hours: Temp:  [98.4 F (36.9 C)-99.9 F (37.7 C)] 99.9 F (37.7 C) (02/13 0900) Pulse Rate:  [66-106] 86 (02/13 0900) Cardiac Rhythm: Normal sinus rhythm (02/13 0800) Resp:  [8-26] 10 (02/13 0900) BP: (89-157)/(56-103) 89/56 (02/13 0900) SpO2:  [93 %-100 %] 95 % (02/13 0900)  Hemodynamic parameters for last 24 hours:    Intake/Output from previous day: 02/12 0701 - 02/13 0700 In: 70 [P.O.:960; I.V.:270] Out: 751 [Urine:700; Stool:1; Chest Tube:50] Intake/Output this shift: Total I/O In: 110 [I.V.:110] Out: 150 [Urine:150]       Exam    General- sleepy comfortable   Lungs- clear without rales, wheezes- wet cough   Cor- regular rate and rhythm, no murmur , gallop   Abdomen- soft, non-tender   Extremities - warm, non-tender, minimal edema   Neuro- oriented, appropriate, no focal weakness   Lab Results:  Recent Labs  01/04/17 0500 01/06/17 0400  WBC 8.1 8.4  HGB 9.6* 9.1*  HCT 31.5* 30.2*  PLT 282 253   BMET:  Recent Labs  01/04/17 0500 01/06/17 0400  NA 137 133*  K 3.8 4.2  CL 95* 91*  CO2 31 30  GLUCOSE 109* 98  BUN <5* 9  CREATININE 0.79 2.39*  CALCIUM 9.2 8.9    PT/INR: No results for input(s): LABPROT, INR in the last 72 hours. ABG    Component Value Date/Time   PHART 7.426 12/29/2016 0500   HCO3 28.2 (H) 12/29/2016 0500   TCO2 37 12/31/2016 1613   ACIDBASEDEF 1.5 12/28/2016 0404   O2SAT 98.4 12/29/2016 0500   CBG (last 3)   Recent Labs  01/04/17 1210 01/04/17 1558 01/04/17 1938  GLUCAP 94 146* 86    Assessment/Plan: S/P Procedure(s) (LRB): LEFT VIDEO ASSISTED THORACOSCOPY (VATS)/DECORTICATION (Left) VIDEO BRONCHOSCOPY WITH BRONCHIAL LAVAGE (N/A) DC chest tube 3 weeks  total IV vanco for L lung abscess, empyema   LOS: 25 days    Tharon Aquas Trigt III 01/06/2017

## 2017-01-06 NOTE — Progress Notes (Signed)
Progress Note    [name]  MEQ:[id number] DOB: 1966-10-05  DOA: 12/12/2016 PCP: [name], MD    Brief Narrative:   Chief complaint: Follow-up pneumonia/empyema/C. difficile colitis  [name] is an 51 y.o. female with a PMH of 45 pack year history of tobacco abuse, asthma, allergic rhinitis, COPD, hypertension and bipolar disorder who was admitted by the critical care team 12/12/16 with acute hypoxic respiratory failure, asthma exacerbation and multilobar pneumonia. She was also encephalopathic on admission. Her hospital course has been complicated by the development of C. difficile colitis, Streptococcus pneumonia lung abscess/empyema status post VATS on 12/26/16, ileus, pneumothorax, VDRF requiring intubation (intubated 12/12/16-12/21/16, reintubated 12/26/16-12/29/16), and demand ischemia. Patient transferred to the care of [place] on 01/03/17.  Culture data is as follows: 1/19 legionella: negative 1/19 blood cx: neg 1/20 mrsa pcr: neg 1/20 rvp: neg 1/20 resp cx: abundant strep pneumo 1/20 strep pneumo: positive 1/22 HSV swab: HSV1 positive 1/24 BCx: neg 1/28 c diff + 2/2 bronch washing- few candida albicans, dubliniensis 2/2 LLL lung, fungal cx: candida albicans 2/2 LLL wound cx, deep: few candida albicans, rare MRSE  Assessment/Plan:   Principal Problem:   Acute respiratory failure with hypoxia ([place]), Multifactorial Required intubation 12/12/16-12/21/16 with reintubation 12/26/16 through 12/29/16. Continue to wean oxygen. Continue aggressive pulmonary toilet. Continue duo nebs every 6 hours and Pulmicort twice a day. Respiratory status remains stable.    Pneumonia with cavity of lung/Abscess of lower lobe of left lung with pneumonia (HCC)/Empyema of pleural space (HCC)/Pneumonia of left upper lobe due to Streptococcus pneumoniae (HCC)/HCAP/pneumothorax/sepsis Initial respiratory cultures grew strep pneumonia with a positive strep pneumonia antigen. Status post VATS 12/26/16.  Residual chest tube in place with persistent air leak. OR cultures + staph epidermidis. BAL cultures + for Candida. Continue vancomycin for MRSE, and Diflucan for Candida. Dr. Megan Salon of ID following for recommendations regarding duration of therapy. Chest x-ray today shows stable findings and no pneumothorax.    Active Problems:   AKI Creatinine 0.79--->2.39 over night.  Will give a 1 liter bolus and start IVF at 100 cc/hr.  Bladder scan now.    Nausea and vomiting Given Compazine, which may have exacerbated her encephalopathy. Follow-up x-rays of the chest.    Acute encephalopathy secondary to sepsis/bipolar disorder D/C narcotics. Lamictal, Seroquel, Lexapro and Neurontin. Compazine given last night and may have caused some EPS given myoclonic jerking noted on exam today. Dramatic improvement after being given Narcan 04. Mg x 1.    Dysphagia/ileus Initially managed with TPN given ileus. Being followed by speech therapist. Modified barium swallow done 01/02/17. Diet subsequently advanced to regular with thin liquids.    Pressure injury of skin Skin care per nursing.    Hypertension Continue scheduled Lopressor and when necessary labetalol.    Transaminitis Resolved.    Enteritis due to Clostridium difficile/abdominal distention Initially treated with vancomycin enemas and IV Flagyl. Continue oral vancomycin. Having formed stools per nursing.    Physical deconditioning Physical therapy following. SNF recommended but patient is declining at present. Says she has 24-hour care available.    Normocytic anemia Likely anemia of chronic disease/inflammation. Hemoglobin stable.    Hypomagnesemia Replaced.   Family Communication/Anticipated D/C date and plan/Code Status   DVT prophylaxis: Lovenox ordered. Code Status: Full Code.  Family Communication: No family present at the bedside. Daughter updated by telephone 01/05/17. Disposition Plan: Once to be discharged home although PT  recommends SNF.   Medical Consultants:    CVTS  PCCM  Neurology  ID   Procedures:    VATS2/2/18  Anti-Infectives:   2/6 vanc>> 2/3 eraxis>>2/6 2/2 zosyn>>2/7 1/29 flagyl>> 2/2; resumed 2/3>> 1/30 vanc enema>>2/7 Fluconazole PO 2/7>> Vanc PO 2/8>> 1/19 ceftriaxone > 1/24 1/19 azithromycin > 1/22 1/22 acyclovir > 1/29 1/24 unasyn >>2/2  Subjective:   Had nausea and vomiting over night. Was given Compazine.  Called by RN to assess patient due to change in mental status/somnolence.  Patient very somnolent on arrival.  Opened eyes to sternal rub, but non verbal with myoclonic jerking of muscles.  Mental status dramatically improved with 0.4 mg of Narcan x 1.    Objective:    Vitals:   01/06/17 0400 01/06/17 0500 01/06/17 0600 01/06/17 0700  BP: 112/69 116/67 (!) 148/73 126/67  Pulse: 80 80 90 90  Resp: _0 Temp: 98.6 F (37 C)     TempSrc: Oral     SpO2: 100% 100% 100% 99%  Weight:      Height:        Intake/Output Summary (Last 24 hours) at 01/06/17 1601 Last data filed at 01/06/17 0400  Gross per 24 hour  Intake             1200 ml  Output              741 ml  Net              459 ml   Filed Weights   01/02/17 0600 01/04/17 0500 01/05/17 0454  Weight: 70 kg (154 lb 5.2 oz) 74.2 kg (163 lb 9.3 oz) 71 kg (156 lb 8.4 oz)    Exam: General exam: Appears older than her stated age, somnolent with myoclonic jerking of muscles. Respiratory system: Diminished breath sounds. Chest tube left flank. Cardiovascular system: Mildly tachy, regular rhythm. No JVD,  rubs, gallops or clicks. No murmurs. Gastrointestinal system: Abdomen is nondistended, soft and nontender. No organomegaly or masses felt. Normal bowel sounds heard. Central nervous system: Somnolent but responded to Narcan.  Tremulous with myoclonic jerking. No focal neurological deficits. Extremities: No clubbing,  or cyanosis. No edema. Skin: No rashes, lesions or ulcers. Psychiatry:  Judgement and insight appear impaired. Mood & affect flat.   Data Reviewed:   I have personally reviewed following labs and imaging studies:  Labs: Basic Metabolic Panel:  Recent Labs Lab 12/31/16 0446 12/31/16 1613 01/01/17 0400 01/03/17 0345 01/04/17 0500 01/06/17 0400  NA 139 136 134* 137 137 133*  K 3.4* 3.6 3.4* 3.7 3.8 4.2  CL 93* 87* 89* 93* 95* 91*  CO2 36*  --  36* 33* 31 30  GLUCOSE 138* 182* 129* 114* 109* 98  BUN _1 <5* <5* 9  CREATININE 0.44 0.50 0.42* 0.50 0.79 2.39*  CALCIUM 8.7*  --  8.8* 8.8* 9.2 8.9  MG 1.5*  --  1.2*  --  1.9  --   PHOS  --   --  3.0  --   --   --    GFR Estimated Creatinine Clearance: 25.4 mL/min (by C-G formula based on SCr of 2.39 mg/dL (H)). Liver Function Tests:  Recent Labs Lab 01/01/17 0400  AST 12*  ALT 14  ALKPHOS 64  BILITOT 0.5  PROT 5.3*  ALBUMIN 2.1*    CBC:  Recent Labs Lab 12/30/16 1543 12/31/16 1613 01/03/17 0345 01/04/17 0500 01/06/17 0400  WBC  --   --  8.4 8.1 8.4  HGB 11.9* 11.2* 9.5* 9.6*  9.1*  HCT 35.0* 33.0* 31.0* 31.5* 30.2*  MCV  --   --  92.5 92.9 94.7  PLT  --   --  287 282 253   CBG:  Recent Labs Lab 01/03/17 2141 01/04/17 0817 01/04/17 1210 01/04/17 1558 01/04/17 1938  GLUCAP 102* 104* 94 146* 86    Microbiology No results found for this or any previous visit (from the past 240 hour(s)).  Radiology: Dg Chest Port 1 View  Result Date: 01/05/2017 CLINICAL DATA:  Chest pain EXAM: PORTABLE CHEST 1 VIEW COMPARISON:  January 04, 2017 FINDINGS: Central catheter tip is in the superior vena cava. There is a chest tube on the left. There is no evident pneumothorax. There is atelectatic change in the left mid lung and lateral right base regions. No new opacity evident. Heart size and pulmonary vascularity are normal. No adenopathy. There is degenerative change in the right shoulder. IMPRESSION: Tube and catheter positions as described without pneumothorax. Areas of atelectatic change  bilaterally. No new opacity. Stable cardiac silhouette. Electronically Signed   By: Lowella Grip III M.D.   On: 01/05/2017 07:10   Dg Chest Port 1 View  Result Date: 01/04/2017 CLINICAL DATA:  Empyema EXAM: PORTABLE CHEST 1 VIEW COMPARISON:  01/03/2017 FINDINGS: Support devices including left chest tube are unchanged. No visible pneumothorax. No confluent opacities. Heart is borderline in size. IMPRESSION: No change.  No pneumothorax. Electronically Signed   By: Rolm Baptise M.D.   On: 01/04/2017 08:45   CXR 1/19>> Consolidations bilaterally CT head 1/19>> Left occipital lobe probable acute or early subacute infarction. No acute intracranial hemorrhage. 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 There is a small amount of fluid in this structure. Also a 1 cm nodular structure in the left lower lobe with minimal cavitation.  Abd xr 1/27 >> bowel distended with gas CT Chest 2/1 >> Moderate left hydropneumothorax, no change in size of pulmonary abscess in the lingula, interval increase in loculated fluid at the left lung base with scattered air pockets and lower lobe atelectasis  Medications:   . bisacodyl  10 mg Oral Daily  . budesonide (PULMICORT) nebulizer solution  0.5 mg Nebulization BID  . Chlorhexidine Gluconate Cloth  6 each Topical Daily  . enoxaparin (LOVENOX) injection  40 mg Subcutaneous Q24H  . escitalopram  40 mg Oral Daily  . feeding supplement (ENSURE ENLIVE)  237 mL Oral TID BM  . fluconazole  400 mg Oral Daily  . gabapentin  300 mg Oral TID  . lamoTRIgine  100 mg Oral BID  . mouth rinse  15 mL Mouth Rinse BID  . metoprolol tartrate  25 mg Oral BID  . pantoprazole  40 mg Oral Q1200  . QUEtiapine  100 mg Oral QHS  . senna-docusate  1 tablet Oral QHS  . sodium  chloride flush  10-40 mL Intracatheter Q12H  . vancomycin  250 mg Oral Q6H   Continuous Infusions: . dextrose 5 % and 0.45% NaCl 10 mL/hr at 01/06/17 0400    Medical decision making is of high complexity and this patient is at high risk of deterioration, therefore this is a level 3 visit.  (> 4 problem points, 4 data points, high risk)    LOS: 25 days  [facility] Pager [phone]. If unable to reach me by pager, please call my cell phone at [phone].  *Please refer to [email or web address], password [email or web address] to get updated schedule on who will round on this patient, as hospitalists switch teams weekly. If 7PM-7AM, please contact night-coverage at [email or web address], password [email or web address] for any overnight needs.  01/06/2017, 7:22 AM

## 2017-01-06 NOTE — Progress Notes (Addendum)
Nutrition Follow-up  DOCUMENTATION CODES:   Not applicable  INTERVENTION:    Continue Ensure Enlive po TID, each supplement provides 350 kcal and 20 grams of protein  NUTRITION DIAGNOSIS:   Increased nutrient needs related to acute illness, wound healing as evidenced by estimated needs, ongoing  GOAL:   Patient will meet greater than or equal to 90% of their needs, progressing  MONITOR:   PO intake, Supplement acceptance, Labs, Weight trends, I & O's,   ASSESSMENT:   51 year old with severe persistent asthma, COPD, hypertension, bipolar. Admitted with acute hypoxic respiratory failure, multilobar Streptococcus pneumonia.     Pt s/p procedures 2/2: VIDEO ASSISTED THORACOSCOPY (VATS)/DECORTICATION (Left) VIDEO BRONCHOSCOPY WITH BRONCHIAL LAVAGE   Pt extubated 2/5. TPN discontinued and Regular diet advanced 2/9. PO intake variable at 30-75% per flowsheet records. Ensure Enlive supplements ordered 2/12. CBG's [id number].  Diet Order:  Diet regular Room service appropriate? Yes; Fluid consistency: Thin  Skin:  Wound (see comment) (Unstg to lip, incision on chest)  Last BM:  2/12  Height:   Ht Readings from Last 1 Encounters:  01/01/17 5' 1.5" (1.562 m)    Weight:   Wt Readings from Last 1 Encounters:  01/05/17 156 lb 8.4 oz (71 kg)    Ideal Body Weight:  54.54 kg  BMI:  Body mass index is 29.1 kg/m.  Re-estimated Nutritional Needs:   Kcal:  1700-1900  Protein:  115-125 gm  Fluid:  per MD  EDUCATION NEEDS:   No education needs identified at this time  Arthur Holms, RD, LDN Pager #: [phone] After-Hours Pager #: [phone]

## 2017-01-06 NOTE — Progress Notes (Addendum)
XRAY transporter Jamelle Haring picking up pt for 2view now. Pt resting comfortably, VSS whole shift. Pt sent with O2tank and monitor attached.   Ticket to ride used.

## 2017-01-06 NOTE — Progress Notes (Signed)
01/06/2017 0830 Dr. Rockne Menghini paged and made aware of pt. Extreme lethargy this am. MAR reviewed as well as AM labs. Pt. Is able to arouse and able to follow commands, alert to self and location, disoriented to time and situation. MD to see patient. Will continue to closely monitor patient.  [name], Arville Lime

## 2017-01-06 NOTE — Progress Notes (Signed)
Pt now returned from [place]. Resting calmly in bed, VSS, no changes in assessment.

## 2017-01-07 ENCOUNTER — Inpatient Hospital Stay (HOSPITAL_COMMUNITY): Payer: Medicaid Other

## 2017-01-07 LAB — BASIC METABOLIC PANEL
ANION GAP: 10 (ref 5–15)
BUN: 11 mg/dL (ref 6–20)
CALCIUM: 8.7 mg/dL — AB (ref 8.9–10.3)
CO2: 28 mmol/L (ref 22–32)
Chloride: 96 mmol/L — ABNORMAL LOW (ref 101–111)
Creatinine, Ser: 3.31 mg/dL — ABNORMAL HIGH (ref 0.44–1.00)
GFR, EST AFRICAN AMERICAN: 17 mL/min — AB (ref >60)
GFR, EST NON AFRICAN AMERICAN: 15 mL/min — AB (ref >60)
GLUCOSE: 97 mg/dL (ref 65–99)
POTASSIUM: 4.5 mmol/L (ref 3.5–5.1)
Sodium: 134 mmol/L — ABNORMAL LOW (ref 135–145)

## 2017-01-07 LAB — VANCOMYCIN, RANDOM: VANCOMYCIN RM: 20

## 2017-01-07 LAB — MAGNESIUM: MAGNESIUM: 1.9 mg/dL (ref 1.7–2.4)

## 2017-01-07 MED ORDER — LINEZOLID 600 MG PO TABS
600.0000 mg | ORAL_TABLET | Freq: Two times a day (BID) | ORAL | Status: DC
  Administered 2017-01-07 – 2017-01-12 (×11): 600 mg via ORAL
  Filled 2017-01-07 (×13): qty 1

## 2017-01-07 MED ORDER — OXYCODONE HCL 5 MG PO TABS
5.0000 mg | ORAL_TABLET | Freq: Four times a day (QID) | ORAL | Status: DC | PRN
  Administered 2017-01-07 – 2017-01-12 (×15): 5 mg via ORAL
  Filled 2017-01-07 (×15): qty 1

## 2017-01-07 MED ORDER — QUETIAPINE FUMARATE 50 MG PO TABS
100.0000 mg | ORAL_TABLET | Freq: Every day | ORAL | Status: DC
  Administered 2017-01-07 – 2017-01-11 (×5): 100 mg via ORAL
  Filled 2017-01-07 (×5): qty 2

## 2017-01-07 NOTE — Progress Notes (Signed)
1st attempt to call report. Nurse to call back.

## 2017-01-07 NOTE — Progress Notes (Signed)
Speech Language Pathology Treatment: Dysphagia  Patient Details Name: [name] MRN: [id number] DOB: 05/02/1966 Today's Date: 01/07/2017 Time: 9969-2493 SLP Time Calculation (min) (ACUTE ONLY): 18 min  Assessment / Plan / Recommendation Clinical Impression  Pt seen with am meal. SLP assisted pt in meal set up, but pt otherwise independent with self feeding, utilizing recommended precaution of small sips with 100% accuracy. Also able to verbalize precaution. SLP reinforced rationale. All education completed. No SLP f/u needed.    HPI        SLP Plan  All goals met     Recommendations  Diet recommendations: Regular;Thin liquid Liquids provided via: Cup;Straw Medication Administration: Whole meds with puree Supervision: Patient able to self feed;Intermittent supervision to cue for compensatory strategies Compensations: Slow rate;Small sips/bites Postural Changes and/or Swallow Maneuvers: Seated upright 90 degrees                Plan: All goals met       GO               Tanya Baltimore, MA CCC-SLP [phone]  Lynann Beaver 01/07/2017, 9:07 AM

## 2017-01-07 NOTE — Progress Notes (Signed)
Report given to [place] on 3W. Patient going to room 16. All personal belongings accounted for and transferred with patient.

## 2017-01-07 NOTE — Progress Notes (Signed)
Pharmacy Antibiotic Note  [name] is a 51 y.o. female admitted on 12/12/2016 with pneumonia.   She is currently finishing a course of Vancomycin and Fluconazole for empyema, and also PO Vanc for Cdiff. However her SCr continues to rise and Vancomycin may not be the best choice for her any longer. Discussed her case with Dr. Tommy Medal.  Will change IV Vancomycin to PO Linezolid to complete therapy through 2/22. Continue Fluconazole and PO Vanc as previously ordered.  Plan Discontinue Vancomycin Linezolid 600mg  PO q12h through 01/15/17  Height: 5' 1.5" (156.2 cm) Weight: 156 lb 8.4 oz (71 kg) IBW/kg (Calculated) : 48.95  Temp (24hrs), Avg:98 F (36.7 C), Min:97 F (36.1 C), Max:98.5 F (36.9 C)   Recent Labs Lab 01/01/17 0400 01/02/17 0244 01/03/17 0345 01/04/17 0500 01/04/17 1510  01/06/17 0400 01/07/17 0500 01/07/17 0821  WBC  --   --  8.4 8.1  --   --  8.4  --   --   CREATININE 0.42*  --  0.50 0.79  --   --  2.39* 3.31*  --   VANCOTROUGH  --  10*  --   --  44*  --   --   --   --   VANCORANDOM  --   --   --   --   --   < > 29  --  20  < > = values in this interval not displayed.  Estimated Creatinine Clearance: 18.3 mL/min (by C-G formula based on SCr of 3.31 mg/dL (H)).    Allergies  Allergen Reactions  . Cymbalta [Duloxetine Hcl] Other (See Comments)    Mood changes...mean  . Mucinex [Guaifenesin Er] Other (See Comments)    Possible allergy, per patient (??)  . Nyquil Multi-Symptom [Pseudoeph-Doxylamine-Dm-Apap] Hives  . Paxil [Paroxetine Hcl] Other (See Comments)    Mean and aggresive  . Promethazine Itching  . Tramadol Itching  . Wellbutrin [Bupropion] Other (See Comments)    Rapid heartrate  . Zoloft [Sertraline] Other (See Comments)    Anger    Legrand Como, Pharm.D., BCPS, AAHIVP Clinical Pharmacist Phone: [phone] or [phone] Pager: [phone] 01/07/2017, 11:42 AM

## 2017-01-07 NOTE — Care Management Note (Addendum)
Case Management Note  Patient Details  Name: [name] MRN: [id number] Date of Birth: 25-Aug-1966  Subjective/Objective:   Patient transferred in to SDU late evening on 2/13 and transferred to tele floor on 2/14 in the am,  Patient is refusing snf per MD, and she is having an ultrasound today  For kidneys to see why her creatinine is going up.  NCM went to room to see patient, she was gone down for u/s.  NCM spoke with daughter, [name] she states that she wants her mother to go home also.  She works and will need a Therapist, music and [place] set up, she will contact NCM in the am.  Patient has [employer], she had a infarct on this admission so she should be able to get some therapy.  NCM will cont to follow for dc needs.  Patient is also on oxygen, she may need home oxygen.  NCM will cont to follow for dc needs.               Action/Plan:   Expected Discharge Date:                  Expected Discharge Plan:  Skilled Nursing Facility  In-House Referral:  Clinical Social Work  Discharge planning Services  CM Consult  Post Acute Care Choice:    Choice offered to:     DME Arranged:    DME Agency:     HH Arranged:    [place] Agency:     Status of Service:  In process, will continue to follow  If discussed at Long Length of Stay Meetings, dates discussed:    Additional Comments:  Zenon Mayo, RN 01/07/2017, 5:45 PM

## 2017-01-07 NOTE — Progress Notes (Addendum)
Progress Note    [name]  IEP:[id number] DOB: [birth year]  DOA: 12/12/2016   PCP: [name], MD   Brief Narrative:   Chief complaint: Follow-up pneumonia/empyema/C. difficile colitis  51 y.o. female with a PMH of 27 pack year history of tobacco abuse, asthma, allergic rhinitis, COPD, hypertension and bipolar disorder who was admitted by the critical care team 12/12/16 with acute hypoxic respiratory failure, asthma exacerbation and multilobar pneumonia. She was also encephalopathic on admission. Her hospital course has been complicated by the development of C. difficile colitis, Streptococcus pneumonia lung abscess/empyema status post VATS on 12/26/16, ileus, pneumothorax, VDRF requiring intubation (intubated 12/12/16-12/21/16, reintubated 12/26/16-12/29/16), and demand ischemia. Patient transferred to the care of [facility] on 01/03/17.  Culture data is as follows: 1/19 legionella: negative 1/19 blood cx: neg 1/20 mrsa pcr: neg 1/20 rvp: neg 1/20 resp cx: abundant strep pneumo 1/20 strep pneumo: positive 1/22 HSV swab: HSV1 positive 1/24 BCx: neg 1/28 c diff + 2/2 bronch washing- few candida albicans, dubliniensis 2/2 LLL lung, fungal cx: candida albicans 2/2 LLL wound cx, deep: few candida albicans, rare MRSE  Assessment/Plan:   Principal Problem:   Acute respiratory failure with hypoxia ([place]), Multifactorial - Required intubation 12/12/16-12/21/16 with reintubation 12/26/16 through 12/29/16. Continue to wean oxygen.  - Continue aggressive pulmonary toilet. Continue duo nebs every 6 hours and Pulmicort twice a day.  - Respiratory status remains stable. Transfer to telemetry unit     Pneumonia with cavity of lung/Abscess of lower lobe of left lung with pneumonia (HCC)/Empyema of pleural space (HCC)/Pneumonia of left upper lobe due to Streptococcus pneumoniae (HCC)/HCAP/pneumothorax/sepsis - Initial respiratory cultures grew strep pneumonia with a positive strep pneumonia antigen.  -  S/P VATS 12/26/16. OR cultures + staph epidermidis. BAL cultures + for Candida.  - Continued vancomycin for MRSE, and Diflucan for Candida. Dr. Megan Salon of ID following for rec's regarding duration of therapy, ABX changed to Linezolid  - chest tubes have all been removed, CXR is free from pneumothora - CTS will arrange 2 week follow up for patient, chest tube sutures can be removed at discharge,  Active Problems:    AKI - Cr continues trending up from 2.39 --> 3.31 - bit concerned that Vancomycin could be offending agent here but this is not clear at this point  - will order renal US - BMP in AM    Nausea and vomiting - resolved - tolerating diet well     Acute encephalopathy secondary to sepsis/bipolar disorder - improved and pt more alert this am - resume Seroquel today     Dysphagia/ileus - Initially managed with TPN given ileus - diet advanced to regular     Pressure injury of skin - Skin care per nursing.    Hypertension, essential  - Continue scheduled Lopressor and when necessary labetalol.    Transaminitis - resolved     Enteritis due to Clostridium difficile/abdominal distention - Initially treated with vancomycin enemas and IV Flagyl. Continue oral vancomycin.  - Having formed stools per nursing.    Physical deconditioning - SNF recommended but patient is declining at present.    Normocytic anemia - no sings of active bleeding  - CBC in AM    Hypomagnesemia - supplemented, repeat Mg in AM  Family Communication/Anticipated D/C date and plan/Code Status   DVT prophylaxis: Lovenox SQ Code Status: Full Code.  Family Communication: No family present at the bedside.  Disposition Plan: Once to be discharged home although PT recommends SNF.  POssible in 1-2 days.   Medical Consultants:    CVTS  PCCM  Neurology  ID  Procedures:    VATS2/2/18  Anti-Infectives:   2/6 vanc>> changed to Linezolid  2/3 eraxis>>2/6 2/2 zosyn>>2/7 1/29 flagyl>> 2/2;  resumed 2/3>> 1/30 vanc enema>>2/7 Fluconazole PO 2/7>> Vanc PO 2/8>> 1/19 ceftriaxone > 1/24 1/19 azithromycin > 1/22 1/22 acyclovir > 1/29 1/24 unasyn >>2/2  Subjective:   No concerns this AM   Objective:   Vitals:   01/07/17 1100 01/07/17 1200 01/07/17 1206 01/07/17 1500  BP:    (!) 168/93  Pulse: 92 83 85   Resp: _0 Temp:   98.2 F (36.8 C)   TempSrc:   Oral   SpO2: 98% 95% 94%   Weight:      Height:        Intake/Output Summary (Last 24 hours) at 01/07/17 1612 Last data filed at 01/07/17 1100  Gross per 24 hour  Intake             2620 ml  Output              225 ml  Net             2395 ml   Filed Weights   01/02/17 0600 01/04/17 0500 01/05/17 0454  Weight: 70 kg (154 lb 5.2 oz) 74.2 kg (163 lb 9.3 oz) 71 kg (156 lb 8.4 oz)    Exam: General exam: Alert but slow to respond, follows commands appropriately  Respiratory system: Diminished breath sounds.  Cardiovascular system: Mregular rhythm. No JVD,  rubs, gallops or clicks. No murmurs. Gastrointestinal system: Abdomen is nondistended, soft and nontender. No organomegaly or masses felt.  Central nervous system: alert and oriented to name, DOB, place   Data Reviewed:   I have personally reviewed following labs and imaging studies:  Labs: Basic Metabolic Panel:  Recent Labs Lab 01/01/17 0400 01/03/17 0345 01/04/17 0500 01/06/17 0400 01/07/17 0500  NA 134* 137 137 133* 134*  K 3.4* 3.7 3.8 4.2 4.5  CL 89* 93* 95* 91* 96*  CO2 36* 33* _1 GLUCOSE 129* 114* 109* 98 97  BUN 6 <5* <5* 9 11  CREATININE 0.42* 0.50 0.79 2.39* 3.31*  CALCIUM 8.8* 8.8* 9.2 8.9 8.7*  MG 1.2*  --  1.9  --  1.9  PHOS 3.0  --   --   --   --    Liver Function Tests:  Recent Labs Lab 01/01/17 0400  AST 12*  ALT 14  ALKPHOS 64  BILITOT 0.5  PROT 5.3*  ALBUMIN 2.1*    CBC:  Recent Labs Lab 12/31/16 1613 01/03/17 0345 01/04/17 0500 01/06/17 0400  WBC  --  8.4 8.1 8.4  HGB 11.2* 9.5* 9.6* 9.1*    HCT 33.0* 31.0* 31.5* 30.2*  MCV  --  92.5 92.9 94.7  PLT  --  287 282 253   CBG:  Recent Labs Lab 01/03/17 2141 01/04/17 0817 01/04/17 1210 01/04/17 1558 01/04/17 1938  GLUCAP 102* 104* 94 146* 77   Radiology: Dg Chest 2 View  Result Date: 01/06/2017 CLINICAL DATA:  Status post left-sided decortication on February 2nd, 2018 EXAM: CHEST  2 VIEW COMPARISON:  Portable chest x-ray of January 05, 2017 FINDINGS: The lungs are slightly less well inflated today. There is a persistent small left pleural effusion versus pleural thickening laterally. The left-sided chest tube is in stable position. There is no pneumothorax. The interstitial markings  are both lungs are coarse. The heart is top-normal in size. The central pulmonary vascularity is prominent. The left-sided PICC line tip projects over the midportion of the SVC. IMPRESSION: No pneumothorax or significant re-accumulation of left pleural fluid. Stable appearance of the left chest tube and left PICC line. Stable bilateral pulmonary interstitial prominence. Electronically Signed   By: David  Martinique M.D.   On: 01/06/2017 08:11   Dg Chest Port 1 View  Result Date: 01/07/2017 CLINICAL DATA:  Shortness of breath EXAM: PORTABLE CHEST 1 VIEW COMPARISON:  Yesterday FINDINGS: Left-sided chest tube is been removed. No apical pneumothorax. Lateral left pleural thickening is stable. Stable cavitary focus in the lateral left chest. Normal heart size. Negative mediastinal contours. Left upper extremity PICC with tip at the SVC level. IMPRESSION: 1. No interval pneumothorax after chest tube removal. 2. Known cavity in the left mid lung. Electronically Signed   By: Monte Fantasia M.D.   On: 01/07/2017 07:59   CXR 1/19>> Consolidations bilaterally CT head 1/19>> Left occipital lobe probable acute or early subacute infarction. No acute intracranial hemorrhage. 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 There is a small amount of fluid in this structure. Also a 1 cm nodular structure in the left lower lobe with minimal cavitation.  Abd xr 1/27 >> bowel distended with gas CT Chest 2/1 >> Moderate left hydropneumothorax, no change in size of pulmonary abscess in the lingula, interval increase in loculated fluid at the left lung base with scattered air pockets and lower lobe atelectasis  Medications:   . bisacodyl  10 mg Oral Daily  . budesonide (PULMICORT) nebulizer solution  0.5 mg Nebulization BID  . Chlorhexidine Gluconate Cloth  6 each Topical Daily  . enoxaparin (LOVENOX) injection  30 mg Subcutaneous Q24H  . feeding supplement (ENSURE ENLIVE)  237 mL Oral TID BM  . fluconazole  200 mg Oral Daily  . linezolid  600 mg Oral Q12H  . mouth rinse  15 mL Mouth Rinse BID  . metoprolol tartrate  25 mg Oral BID  . pantoprazole  40 mg Oral Q1200  . senna-docusate  1 tablet Oral QHS  . sodium chloride flush  10-40 mL Intracatheter Q12H  . vancomycin  250 mg Oral Q6H   Continuous Infusions: . sodium chloride 100 mL/hr at 01/07/17 1100     LOS: 26 days   Faye Ramsay, MD [facility] Pager [phone]. If unable to reach me by pager, please call my cell phone at [phone]  Please refer to [email or web address], password [email or web address] to get updated schedule on who will round on this patient, as hospitalists switch teams weekly.  If 7PM-7AM, please contact night-coverage at [email or web address], password [email or web address] for any overnight needs.  01/07/2017, 4:12 PM

## 2017-01-07 NOTE — Progress Notes (Addendum)
      [address]       [place] [address]             [phone]      12 Days Post-Op Procedure(s) (LRB): LEFT VIDEO ASSISTED THORACOSCOPY (VATS)/DECORTICATION (Left) VIDEO BRONCHOSCOPY WITH BRONCHIAL LAVAGE (N/A)   Subjective:  Complains of pain.  Wants 10 mg of Oxycodone.  No diarrhea  Objective: Vital signs in last 24 hours: Temp:  [97 F (36.1 C)-99.9 F (37.7 C)] 98.4 F (36.9 C) (02/14 0800) Pulse Rate:  [72-122] 95 (02/14 0800) Cardiac Rhythm: Normal sinus rhythm (02/14 0701) Resp:  [10-27] 16 (02/14 0800) BP: (89-160)/(53-108) 160/92 (02/14 0800) SpO2:  [88 %-100 %] 88 % (02/14 0700)  Intake/Output from previous day: 02/13 0701 - 02/14 0700 In: 4129 [P.O.:820; I.V.:3309] Out: 450 [Urine:450]  General appearance: alert, cooperative and no distress Heart: regular rate and rhythm Lungs: diminished breath sounds bibasilar Abdomen: soft, non-tender; bowel sounds normal; no masses,  no organomegaly Wound: clean and dry  Lab Results:  Recent Labs  01/06/17 0400  WBC 8.4  HGB 9.1*  HCT 30.2*  PLT 253   BMET:  Recent Labs  01/06/17 0400 01/07/17 0500  NA 133* 134*  K 4.2 4.5  CL 91* 96*  CO2 30 28  GLUCOSE 98 97  BUN 9 11  CREATININE 2.39* 3.31*  CALCIUM 8.9 8.7*    PT/INR: No results for input(s): LABPROT, INR in the last 72 hours. ABG    Component Value Date/Time   PHART 7.426 12/29/2016 0500   HCO3 28.2 (H) 12/29/2016 0500   TCO2 37 12/31/2016 1613   ACIDBASEDEF 1.5 12/28/2016 0404   O2SAT 98.4 12/29/2016 0500   CBG (last 3)   Recent Labs  01/04/17 1210 01/04/17 1558 01/04/17 1938  GLUCAP 94 146* 86    Assessment/Plan: S/P Procedure(s) (LRB): LEFT VIDEO ASSISTED THORACOSCOPY (VATS)/DECORTICATION (Left) VIDEO BRONCHOSCOPY WITH BRONCHIAL LAVAGE (N/A)  1. Empyema-chest tubes have all been removed, CXR is free from pneumothorax 2. ID- on IV Vanco, continue for 3 weeks of total therapy 3. Dispo- will arrange 2  week follow up for patient, chest tube sutures can be removed at discharge, care per primary.. Please contact if you need further assistance   LOS: 26 days    BARRETT, ERIN 01/07/2017 Agree with above assessment and plan

## 2017-01-08 ENCOUNTER — Inpatient Hospital Stay (HOSPITAL_COMMUNITY): Payer: Medicaid Other

## 2017-01-08 LAB — RENAL FUNCTION PANEL
ANION GAP: 8 (ref 5–15)
Albumin: 1.8 g/dL — ABNORMAL LOW (ref 3.5–5.0)
BUN: 14 mg/dL (ref 6–20)
CHLORIDE: 100 mmol/L — AB (ref 101–111)
CO2: 26 mmol/L (ref 22–32)
Calcium: 8.4 mg/dL — ABNORMAL LOW (ref 8.9–10.3)
Creatinine, Ser: 3.66 mg/dL — ABNORMAL HIGH (ref 0.44–1.00)
GFR calc non Af Amer: 13 mL/min — ABNORMAL LOW (ref >60)
GFR, EST AFRICAN AMERICAN: 15 mL/min — AB (ref >60)
GLUCOSE: 91 mg/dL (ref 65–99)
Phosphorus: 6.7 mg/dL — ABNORMAL HIGH (ref 2.5–4.6)
Potassium: 4.4 mmol/L (ref 3.5–5.1)
SODIUM: 134 mmol/L — AB (ref 135–145)

## 2017-01-08 LAB — CREATININE, URINE, RANDOM: CREATININE, URINE: 37.21 mg/dL

## 2017-01-08 LAB — CBC
HCT: 24.4 % — ABNORMAL LOW (ref 36.0–46.0)
HEMOGLOBIN: 7.6 g/dL — AB (ref 12.0–15.0)
MCH: 28.8 pg (ref 26.0–34.0)
MCHC: 31.1 g/dL (ref 30.0–36.0)
MCV: 92.4 fL (ref 78.0–100.0)
Platelets: 236 10*3/uL (ref 150–400)
RBC: 2.64 MIL/uL — AB (ref 3.87–5.11)
RDW: 14.9 % (ref 11.5–15.5)
WBC: 8.7 10*3/uL (ref 4.0–10.5)

## 2017-01-08 LAB — HEMOGLOBIN AND HEMATOCRIT, BLOOD
HEMATOCRIT: 27.5 % — AB (ref 36.0–46.0)
Hemoglobin: 8.4 g/dL — ABNORMAL LOW (ref 12.0–15.0)

## 2017-01-08 LAB — SODIUM, URINE, RANDOM: SODIUM UR: 39 mmol/L

## 2017-01-08 NOTE — Progress Notes (Signed)
Progress Note    [name]  IHK:[id number] DOB: [birth year]  DOA: 12/12/2016   PCP: [name], MD   Brief Narrative:   Chief complaint: Follow-up pneumonia/empyema/C. difficile colitis  51 y.o. female with a PMH of 38 pack year history of tobacco abuse, asthma, allergic rhinitis, COPD, hypertension and bipolar disorder who was admitted by the critical care team 12/12/16 with acute hypoxic respiratory failure, asthma exacerbation and multilobar pneumonia. She was also encephalopathic on admission. Her hospital course has been complicated by the development of C. difficile colitis, Streptococcus pneumonia lung abscess/empyema status post VATS on 12/26/16, ileus, pneumothorax, VDRF requiring intubation (intubated 12/12/16-12/21/16, reintubated 12/26/16-12/29/16), and demand ischemia. Patient transferred to the care of [place] on 01/03/17.  Culture data is as follows: 1/19 legionella: negative 1/19 blood cx: neg 1/20 mrsa pcr: neg 1/20 rvp: neg 1/20 resp cx: abundant strep pneumo 1/20 strep pneumo: positive 1/22 HSV swab: HSV1 positive 1/24 BCx: neg 1/28 c diff + 2/2 bronch washing- few candida albicans, dubliniensis 2/2 LLL lung, fungal cx: candida albicans 2/2 LLL wound cx, deep: few candida albicans, rare MRSE  Assessment/Plan:   Principal Problem:   Acute respiratory failure with hypoxia ([place]), Multifactorial - Required intubation 12/12/16-12/21/16 with reintubation 12/26/16 through 12/29/16. Continue to wean oxygen.  - Continue aggressive pulmonary toilet. Continue duo nebs every 6 hours and Pulmicort twice a day.  - Respiratory status remains stable.     Pneumonia with cavity of lung/Abscess of lower lobe of left lung with pneumonia (HCC)/Empyema of pleural space (HCC)/Pneumonia of left upper lobe due to Streptococcus pneumoniae (HCC)/HCAP/pneumothorax/sepsis - Initial respiratory cultures grew strep pneumonia with a positive strep pneumonia antigen.  - S/P VATS 12/26/16. OR cultures  + staph epidermidis. BAL cultures + for Candida.  - Continued vancomycin for MRSE, and Diflucan for Candida. Dr. Megan Salon of ID following for rec's regarding duration of therapy, ABX changed to Linezolid  - chest tubes have all been removed, CXR is free from pneumothora - CTS will arrange 2 week follow up for patient, chest tube sutures can be removed at discharge,  Active Problems:    AKI - Cr continues trending up from 2.39 --> 3.31 --> 3.66 - bit concerned that Vancomycin could be offending agent here but this is not clear at this point  - IV vancomycin has been stopped and ABX changed to Linezolid - renal US with no signs of hydronephrosis, will check UNa and UCr - BMP in AM, consider nephrology consult in AM if Cr not better  - wills top Lovenox and place on SCD's for now until renal function and Hg stabilize     Nausea and vomiting - resolved - tolerating diet well     Acute encephalopathy secondary to sepsis/bipolar disorder - improved and pt more alert this am - resume Seroquel today     Dysphagia/ileus - Initially managed with TPN given ileus - diet advanced to regular     Pressure injury of skin - Skin care per nursing.    Hypertension, essential  - Continue scheduled Lopressor and when necessary labetalol.    Transaminitis - resolved     Enteritis due to Clostridium difficile/abdominal distention - Initially treated with vancomycin enemas and IV Flagyl. Continue oral vancomycin.  - Having formed stools per nursing.    Physical deconditioning - SNF recommended but patient is declining at present.    Normocytic anemia - no sings of active bleeding but noted Hg drop overnight, repeat Hg check to make  sure no blood work error  - stop Lovenox for now - CBC again in AM    Hypomagnesemia - supplemented, repeat Mg in AM  Family Communication/Anticipated D/C date and plan/Code Status   DVT prophylaxis: Lovenox SQ Code Status: Full Code.  Family Communication:  No family present at the bedside.  Disposition Plan: Once to be discharged home although PT recommends SNF. Hg drop noted, need to make sure not an error, also Cr up so can not discharge until blood work stable.   Medical Consultants:    CVTS  PCCM  Neurology  ID  Procedures:    VATS2/2/18  Anti-Infectives:   2/6 vanc>> changed to Linezolid  2/3 eraxis>>2/6 2/2 zosyn>>2/7 1/29 flagyl>> 2/2; resumed 2/3>> 1/30 vanc enema>>2/7 Fluconazole PO 2/7>> Vanc PO 2/8>> 1/19 ceftriaxone > 1/24 1/19 azithromycin > 1/22 1/22 acyclovir > 1/29 1/24 unasyn >>2/2  Subjective:   No concerns this AM, wants to go home.   Objective:   Vitals:   01/07/17 1605 01/07/17 1950 01/07/17 2006 01/08/17 0525  BP: (!) 149/90  (!) 178/89 (!) 148/82  Pulse:   83 85  Resp:   18   Temp:    98.4 F (36.9 C)  TempSrc:    Oral  SpO2:  95%  90%  Weight:    71.9 kg (158 lb 8.2 oz)  Height:        Intake/Output Summary (Last 24 hours) at 01/08/17 1237 Last data filed at 01/08/17 0526  Gross per 24 hour  Intake          1614.17 ml  Output              250 ml  Net          1364.17 ml   Filed Weights   01/04/17 0500 01/05/17 0454 01/08/17 0525  Weight: 74.2 kg (163 lb 9.3 oz) 71 kg (156 lb 8.4 oz) 71.9 kg (158 lb 8.2 oz)    Exam: General exam: Alert but slow to respond, follows commands appropriately  Respiratory system: Diminished breath sounds.  Cardiovascular system: regular rhythm. No JVD,  rubs, gallops or clicks. No murmurs. Gastrointestinal system: Abdomen is nondistended, soft and nontender. No organomegaly or masses felt.  Central nervous system: alert and oriented to name, DOB, place   Data Reviewed:   I have personally reviewed following labs and imaging studies:  Labs: Basic Metabolic Panel:  Recent Labs Lab 01/03/17 0345 01/04/17 0500 01/06/17 0400 01/07/17 0500 01/08/17 0331  NA 137 137 133* 134* 134*  K 3.7 3.8 4.2 4.5 4.4  CL 93* 95* 91* 96* 100*  CO2 33*  _0 GLUCOSE 114* 109* 98 97 91  BUN <5* <5* _1 CREATININE 0.50 0.79 2.39* 3.31* 3.66*  CALCIUM 8.8* 9.2 8.9 8.7* 8.4*  MG  --  1.9  --  1.9  --   PHOS  --   --   --   --  6.7*   Liver Function Tests:  Recent Labs Lab 01/08/17 0331  ALBUMIN 1.8*    CBC:  Recent Labs Lab 01/03/17 0345 01/04/17 0500 01/06/17 0400 01/08/17 0500  WBC 8.4 8.1 8.4 8.7  HGB 9.5* 9.6* 9.1* 7.6*  HCT 31.0* 31.5* 30.2* 24.4*  MCV 92.5 92.9 94.7 92.4  PLT 287 282 253 236   CBG:  Recent Labs Lab 01/03/17 2141 01/04/17 0817 01/04/17 1210 01/04/17 1558 01/04/17 1938  GLUCAP 102* 104* 94 146* 40   Radiology: Dg Chest  2 View  Result Date: 01/08/2017 CLINICAL DATA:  51 year old female status post left VATS drainage and debridement of left lower lobe lung abscess 12/26/2016. Initial encounter. EXAM: CHEST  2 VIEW COMPARISON:  01/07/2017 and earlier FINDINGS: Seated AP and lateral views of the chest. Stable left PICC line. Stable lung volumes, within normal limits. Stable cardiac size and mediastinal contours. No pneumothorax or pulmonary edema. No definite pleural effusion. Residual mixed patchy and cavitary opacity in the left lung is stable. Superimposed mild bibasilar atelectasis. Multiple right lateral rib fractures. No acute osseous abnormality identified. Stable cholecystectomy clips. Negative visible bowel gas pattern. IMPRESSION: 1. Stable residual mixed patchy and cavitary opacity in the left lung. Mild bibasilar atelectasis. No pneumothorax identified and no definite pleural effusion. 2. No new cardiopulmonary abnormality. Electronically Signed   By: Genevie Ann M.D.   On: 01/08/2017 08:29   US Renal  Result Date: 01/07/2017 CLINICAL DATA:  Acute renal failure EXAM: RENAL / URINARY TRACT ULTRASOUND COMPLETE COMPARISON:  None. FINDINGS: Right Kidney: Length: 13.5 cm. Echogenicity within normal limits. No mass or hydronephrosis visualized. Left Kidney: Length: 11.2 cm. Echogenicity  within normal limits. No mass or hydronephrosis visualized. Assessment of the left kidney was slightly limited due to overlying bandaging. Bladder: Appears normal for degree of bladder distention. IMPRESSION: No obstructive uropathy or renal mass. No nephrolithiasis. Cortical-medullary distinction is maintained within both kidneys. Electronically Signed   By: Ashley Royalty M.D.   On: 01/07/2017 19:40   Dg Chest Port 1 View  Result Date: 01/07/2017 CLINICAL DATA:  Shortness of breath EXAM: PORTABLE CHEST 1 VIEW COMPARISON:  Yesterday FINDINGS: Left-sided chest tube is been removed. No apical pneumothorax. Lateral left pleural thickening is stable. Stable cavitary focus in the lateral left chest. Normal heart size. Negative mediastinal contours. Left upper extremity PICC with tip at the SVC level. IMPRESSION: 1. No interval pneumothorax after chest tube removal. 2. Known cavity in the left mid lung. Electronically Signed   By: Monte Fantasia M.D.   On: 01/07/2017 07:59   CXR 1/19>> Consolidations bilaterally CT head 1/19>> Left occipital lobe probable acute or early subacute infarction. No acute intracranial hemorrhage. 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 There is a small amount of fluid in this structure. Also a 1 cm nodular structure in the left lower lobe with minimal cavitation.  Abd xr 1/27 >> bowel distended with gas CT Chest 2/1 >> Moderate left hydropneumothorax, no change in size of pulmonary abscess in the lingula, interval increase in loculated fluid at the left lung base with scattered air pockets and lower lobe atelectasis  Medications:   . budesonide (PULMICORT) nebulizer solution  0.5 mg Nebulization BID  . Chlorhexidine Gluconate Cloth  6 each Topical Daily  .  enoxaparin (LOVENOX) injection  30 mg Subcutaneous Q24H  . feeding supplement (ENSURE ENLIVE)  237 mL Oral TID BM  . fluconazole  200 mg Oral Daily  . linezolid  600 mg Oral Q12H  . mouth rinse  15 mL Mouth Rinse BID  . metoprolol tartrate  25 mg Oral BID  . pantoprazole  40 mg Oral Q1200  . QUEtiapine  100 mg Oral QHS  . senna-docusate  1 tablet Oral QHS  . sodium chloride flush  10-40 mL  Intracatheter Q12H  . vancomycin  250 mg Oral Q6H   Continuous Infusions: . sodium chloride 50 mL/hr at 01/07/17 1811     LOS: 27 days   Faye Ramsay, MD [facility] Pager [phone]. If unable to reach me by pager, please call my cell phone at [phone]  Please refer to [email or web address], password [email or web address] to get updated schedule on who will round on this patient, as hospitalists switch teams weekly.  If 7PM-7AM, please contact night-coverage at [email or web address], password [email or web address] for any overnight needs.  01/08/2017, 12:37 PM

## 2017-01-08 NOTE — Progress Notes (Signed)
Physical Therapy Treatment Patient Details Name: [name] MRN: [id number] DOB: 31-Aug-1966 Today's Date: 01/08/2017    History of Present Illness 51 y/o F, 20 pk year smoker, with Hx of COPD/asthma and bipolar disorder admitted 1/19 with SOB.  Working diagnosis of acute asthma exacerbation, multilobar PNA with resultant hypoxic respiratory failure.  Intubated 12/12/16 to 12/20/16.  Extubated 1/28. Pt transferred to step down on 1/29 with ileus. NG placed which pt pulled out.    PT Comments    Pt with improved activity tolerance today and less overall assistance needed with mobility. Continues to be limited by shortness of breath despite use of oxygen with mobility. Pt will need 24 hour min assist if discharges home vs SNF as stated by PT on evaluation due to weakness/fall risk. Pt's bed making loud pitched noise with error 4 message, nursing secretary made aware to call and get bed fixed. Pt left in chair with call bell and bed unplugged to stop noise. RN/NT made aware.   Acute PT to continue during pt's hospital stay.   Follow Up Recommendations  SNF;Supervision/Assistance - 24 hour     Equipment Recommendations  Rolling walker with 5" wheels    Precautions / Restrictions Precautions Precautions: Fall Restrictions Weight Bearing Restrictions: No    Mobility  Bed Mobility Overal bed mobility: Needs Assistance Bed Mobility: Supine to Sit     Supine to sit: Modified independent (Device/Increase time);HOB elevated     General bed mobility comments: HOB elevated ~30 degrees with rail used. pt stated she will be getting a hospital bed at home.  Transfers Overall transfer level: Needs assistance Equipment used: Rolling walker (2 wheeled)   Sit to Stand: Min guard Stand pivot transfers: Supervision       General transfer comment: x 2 reps with cues each time for hand placement and anterior weight shifting. mild unsteadiness with each stand that improved with support on RW.  supervision with stand pivot transfer from bed<>BSC using RW.  Ambulation/Gait Ambulation/Gait assistance: Min assist Ambulation Distance (Feet): 20 Feet   Gait Pattern/deviations: Step-through pattern;Decreased stride length;Trunk flexed;Shuffle   Gait velocity interpretation: Below normal speed for age/gender General Gait Details: cues on posture, to increase bil step length, to widen base of support for more normalized base of support.          Cognition Arousal/Alertness: Awake/alert Behavior During Therapy: WFL for tasks assessed/performed Overall Cognitive Status: No family/caregiver present to determine baseline cognitive functioning Area of Impairment: Safety/judgement;Problem solving   Current Attention Level: Selective Memory: Decreased short-term memory Following Commands: Follows one step commands consistently;Follows multi-step commands consistently Safety/Judgement: Decreased awareness of safety;Decreased awareness of deficits Awareness: Emergent Problem Solving: Decreased initiation;Requires verbal cues General Comments: pt initially refusing to use walker with mobility despite unsteady when standing, when pointed out that a walker will assist her balance, she stated 'oh yeah, okay"     Pertinent Vitals/Pain Pain Assessment: No/denies pain Pain Score: 0-No pain     PT Goals (current goals can now be found in the care plan section) Acute Rehab PT Goals Patient Stated Goal: to go home PT Goal Formulation: With patient Time For Goal Achievement: 01/16/17 Potential to Achieve Goals: Good Progress towards PT goals: Progressing toward goals    Frequency    Min 3X/week      PT Plan Current plan remains appropriate    End of Session Equipment Utilized During Treatment: Gait belt;Oxygen Activity Tolerance: Patient tolerated treatment well;No increased pain;Patient limited by fatigue Patient left:  in chair;with call bell/phone within reach;with  nursing/sitter in room     Time: 0942-1005 PT Time Calculation (min) (ACUTE ONLY): 23 min  Charges:  $Gait Training: 8-22 mins $Therapeutic Activity: 8-22 mins                    G Codes:      Willow Ora 01/18/2017, 10:25 AM  Willow Ora, PTA, CLT Acute Rehab Services [phone] 01-18-17, 10:28 AM

## 2017-01-08 NOTE — Care Management Note (Addendum)
Case Management Note  Patient Details  Name: Edy SHELETA SHIPMAN MRN: [id number] Date of Birth: [birth year]  Subjective/Objective:    Pt refusing SNF, states her daughter, [name] [phone]) will take FMLA to provide care.  Pt will need home health services, chooses [facility] after reviewing list of agencies.  CM placed call to [name] to discuss potential discharge, no answer, voice mail message left. 2/15 1:57 PM: Daughter, [name], in room, explained to pt that she cannot afford to take FMLA and that pt would be alone during the day.  Pt now agrees to ST-SNF when medically ready to discharge.  CM notified CSW and [place] of change in plan.          Expected Discharge Plan:  [place]  Discharge planning Services  CM Consult  Post Acute Care Choice:  Durable Medical Equipment, Home Health Choice offered to:  Patient  DME Arranged:  3-N-1, Hospital bed, Walker rolling DME Agency:  [place]:  RN, PT, OT, Nurse's Aide [facility] Agency:  [place]  Status of Service:  In process, will continue to follow  Girard Cooter, RN 01/08/2017, 11:33 AM

## 2017-01-09 LAB — BASIC METABOLIC PANEL
ANION GAP: 9 (ref 5–15)
BUN: 14 mg/dL (ref 6–20)
CO2: 26 mmol/L (ref 22–32)
Calcium: 8.8 mg/dL — ABNORMAL LOW (ref 8.9–10.3)
Chloride: 103 mmol/L (ref 101–111)
Creatinine, Ser: 3.81 mg/dL — ABNORMAL HIGH (ref 0.44–1.00)
GFR calc Af Amer: 15 mL/min — ABNORMAL LOW (ref >60)
GFR calc non Af Amer: 13 mL/min — ABNORMAL LOW (ref >60)
GLUCOSE: 103 mg/dL — AB (ref 65–99)
POTASSIUM: 4.4 mmol/L (ref 3.5–5.1)
Sodium: 138 mmol/L (ref 135–145)

## 2017-01-09 LAB — CBC
HEMATOCRIT: 25.7 % — AB (ref 36.0–46.0)
Hemoglobin: 8 g/dL — ABNORMAL LOW (ref 12.0–15.0)
MCH: 29.2 pg (ref 26.0–34.0)
MCHC: 31.1 g/dL (ref 30.0–36.0)
MCV: 93.8 fL (ref 78.0–100.0)
Platelets: 254 10*3/uL (ref 150–400)
RBC: 2.74 MIL/uL — AB (ref 3.87–5.11)
RDW: 15.6 % — ABNORMAL HIGH (ref 11.5–15.5)
WBC: 7.4 10*3/uL (ref 4.0–10.5)

## 2017-01-09 MED ORDER — HYDRALAZINE HCL 10 MG PO TABS
10.0000 mg | ORAL_TABLET | Freq: Three times a day (TID) | ORAL | Status: DC
  Administered 2017-01-09 – 2017-01-11 (×6): 10 mg via ORAL
  Filled 2017-01-09 (×5): qty 1

## 2017-01-09 MED ORDER — HYDRALAZINE HCL 20 MG/ML IJ SOLN
5.0000 mg | INTRAMUSCULAR | Status: DC | PRN
  Administered 2017-01-09 – 2017-01-11 (×5): 5 mg via INTRAVENOUS
  Filled 2017-01-09 (×5): qty 1

## 2017-01-09 NOTE — Progress Notes (Signed)
Physical Therapy Treatment Patient Details Name: [name] MRN: [id number] DOB: 03/09/1966 Today's Date: 01/09/2017    History of Present Illness 51 y/o F, 36 pk year smoker, with Hx of COPD/asthma and bipolar disorder admitted 1/19 with SOB.  Working diagnosis of acute asthma exacerbation, multilobar PNA with resultant hypoxic respiratory failure.  Intubated 12/12/16 to 12/20/16.  Extubated 1/28. Pt transferred to step down on 1/29 with ileus. NG placed which pt pulled out.    PT Comments    Patient was limited by fatigue from getting to the commode and being cleaned. She is motivated to walk but was too short of breath. She would benefit from further therapy to improve her endurance.   Follow Up Recommendations  SNF;Supervision/Assistance - 24 hour     Equipment Recommendations  Rolling walker with 5" wheels    Recommendations for Other Services       Precautions / Restrictions Precautions Precautions: Fall Restrictions Weight Bearing Restrictions: No    Mobility  Bed Mobility Overal bed mobility: Needs Assistance Bed Mobility: Supine to Sit     Supine to sit: HOB elevated;Supervision Sit to supine: Min guard   General bed mobility comments: fatigued when getting back to bed.   Transfers Overall transfer level: Needs assistance Equipment used: Rolling walker (2 wheeled)   Sit to Stand: Min guard Stand pivot transfers: Min guard       General transfer comment: required cuing for safety. Was abel to get to the commode and back  Ambulation/Gait Ambulation/Gait assistance: Min assist Ambulation Distance (Feet): 6 Feet   Gait Pattern/deviations: Step-through pattern;Decreased stride length;Trunk flexed;Shuffle   Gait velocity interpretation: Below normal speed for age/gender General Gait Details: Patient only able to walk to the commode and back. She was significantly fatigued after sitting on the commode and standing to be cleaned   Marine scientist [place] (Stroke Patients Only)       Balance                                    Cognition Arousal/Alertness: Awake/alert Behavior During Therapy: WFL for tasks assessed/performed Overall Cognitive Status: No family/caregiver present to determine baseline cognitive functioning Area of Impairment: Safety/judgement;Problem solving                    Exercises      General Comments        Pertinent Vitals/Pain Pain Assessment: No/denies pain Pain Score: 0-No pain    Home Living                      Prior Function            PT Goals (current goals can now be found in the care plan section) Acute Rehab PT Goals Patient Stated Goal: to go home PT Goal Formulation: With patient Time For Goal Achievement: 01/16/17 Potential to Achieve Goals: Good Progress towards PT goals: Progressing toward goals    Frequency    Min 3X/week      PT Plan Current plan remains appropriate    Co-evaluation             End of Session Equipment Utilized During Treatment: Gait belt;Oxygen Activity Tolerance: Patient tolerated treatment well;No increased pain;Patient limited by fatigue Patient left: in chair;with call bell/phone within reach;with nursing/sitter  in room     Time: 0158-0218 PT Time Calculation (min) (ACUTE ONLY): 20 min  Charges:  $Gait Training: 8-22 mins                    G Codes:      Carney Living PT DPT  01/09/2017, 3:34 PM

## 2017-01-09 NOTE — Progress Notes (Signed)
Progress Note    [name]  MCN:[id number] DOB: November 02, 1966  DOA: 12/12/2016   PCP: [name], MD   Brief Narrative:   Chief complaint: Follow-up pneumonia/empyema/C. difficile colitis  51 y.o. female with a PMH of 71 pack year history of tobacco abuse, asthma, allergic rhinitis, COPD, hypertension and bipolar disorder who was admitted by the critical care team 12/12/16 with acute hypoxic respiratory failure, asthma exacerbation and multilobar pneumonia. She was also encephalopathic on admission. Her hospital course has been complicated by the development of C. difficile colitis, Streptococcus pneumonia lung abscess/empyema status post VATS on 12/26/16, ileus, pneumothorax, VDRF requiring intubation (intubated 12/12/16-12/21/16, reintubated 12/26/16-12/29/16), and demand ischemia. Patient transferred to the care of [place] on 01/03/17.  Culture data is as follows: 1/19 legionella: negative 1/19 blood cx: neg 1/20 mrsa pcr: neg 1/20 rvp: neg 1/20 resp cx: abundant strep pneumo 1/20 strep pneumo: positive 1/22 HSV swab: HSV1 positive 1/24 BCx: neg 1/28 c diff + 2/2 bronch washing- few candida albicans, dubliniensis 2/2 LLL lung, fungal cx: candida albicans 2/2 LLL wound cx, deep: few candida albicans, rare MRSE  Assessment/Plan:   Principal Problem:   Acute respiratory failure with hypoxia ([place]), Multifactorial - Required intubation 12/12/16-12/21/16 with reintubation 12/26/16 through 12/29/16. Continue to wean oxygen.  - Continue aggressive pulmonary toilet. Continue duo nebs every 6 hours and Pulmicort twice a day.  - Respiratory status remains stable.     Pneumonia with cavity of lung/Abscess of lower lobe of left lung with pneumonia (HCC)/Empyema of pleural space (HCC)/Pneumonia of left upper lobe due to Streptococcus pneumoniae (HCC)/HCAP/pneumothorax/sepsis - Initial respiratory cultures grew strep pneumonia with a positive strep pneumonia antigen.  - S/P VATS 12/26/16. OR cultures  + staph epidermidis. BAL cultures + for Candida.  - Continued vancomycin for MRSE, and Diflucan for Candida. Dr. Megan Salon of ID following for rec's regarding duration of therapy, ABX changed to Linezolid  - chest tubes have all been removed, CXR is free from pneumothora - CTS will arrange 2 week follow up for patient, chest tube sutures can be removed at discharge,  Active Problems:    AKI - Cr continues trending up from 2.39 --> 3.31 --> 3.66 --> 3.8 this AM  - bit concerned that Vancomycin could be offending agent here but this is not clear at this point  - IV vancomycin has been stopped and ABX changed to Linezolid - renal US with no signs of hydronephrosis, will check UNa and UCr - BMP in AM, consulted nephrology  - Lovenox has also been stopped and pt placed on SCD's instead     Nausea and vomiting - resolved - tolerating diet well     Acute encephalopathy secondary to sepsis/bipolar disorder - improved and pt more alert this am - resume Seroquel today     Dysphagia/ileus - Initially managed with TPN given ileus - diet advanced to regular     Pressure injury of skin - Skin care per nursing.    Hypertension, essential  - Continue scheduled Lopressor and when necessary hydralazine - added scheduled hydralazine to see if that will help     Transaminitis - resolved     Enteritis due to Clostridium difficile/abdominal distention - Initially treated with vancomycin enemas and IV Flagyl. Continue oral vancomycin.  - Having formed stools per nursing.    Physical deconditioning - SNF recommended but patient is declining at present.    Normocytic anemia - no sings of active bleeding but noted Hg drop overnight,  repeat Hg check to make sure no blood work error  - stop Lovenox for now - CBC again in AM    Hypomagnesemia - supplemented, repeat Mg in AM  Family Communication/Anticipated D/C date and plan/Code Status   DVT prophylaxis: Lovenox SQ Code Status: Full Code.    Family Communication: No family present at the bedside.  Disposition Plan: Once to be discharged home although PT recommends SNF. Hg drop noted, need to make sure not an error, also Cr up so can not discharge until blood work stable.   Medical Consultants:    CVTS  PCCM  Neurology  ID  Procedures:    VATS2/2/18  Anti-Infectives:   2/6 vanc>> changed to Linezolid  2/3 eraxis>>2/6 2/2 zosyn>>2/7 1/29 flagyl>> 2/2; resumed 2/3>> 1/30 vanc enema>>2/7 Fluconazole PO 2/7>> Vanc PO 2/8>> 1/19 ceftriaxone > 1/24 1/19 azithromycin > 1/22 1/22 acyclovir > 1/29 1/24 unasyn >>2/2  Subjective:   No concerns this AM, wants to go home.   Objective:   Vitals:   01/09/17 0344 01/09/17 0454 01/09/17 0554 01/09/17 1529  BP: (!) 168/100 (!) 145/87 (!) 142/83 (!) 169/110  Pulse:      Resp:      Temp: 97.2 F (36.2 C)   98 F (36.7 C)  TempSrc: Axillary   Oral  SpO2: 91%   92%  Weight: 72 kg (158 lb 11.2 oz)     Height:        Intake/Output Summary (Last 24 hours) at 01/09/17 1554 Last data filed at 01/09/17 1500  Gross per 24 hour  Intake             2420 ml  Output              490 ml  Net             1930 ml   Filed Weights   01/05/17 0454 01/08/17 0525 01/09/17 0344  Weight: 71 kg (156 lb 8.4 oz) 71.9 kg (158 lb 8.2 oz) 72 kg (158 lb 11.2 oz)    Exam: General exam: Alert but slow to respond, follows commands appropriately  Respiratory system: Diminished breath sounds.  Cardiovascular system: regular rhythm. No JVD,  rubs, gallops or clicks. No murmurs. Gastrointestinal system: Abdomen is nondistended, soft and nontender. No organomegaly or masses felt.  Central nervous system: alert and oriented to name, DOB, place   Data Reviewed:   I have personally reviewed following labs and imaging studies:  Labs: Basic Metabolic Panel:  Recent Labs Lab 01/04/17 0500 01/06/17 0400 01/07/17 0500 01/08/17 0331 01/09/17 0351  NA 137 133* 134* 134* 138  K 3.8  4.2 4.5 4.4 4.4  CL 95* 91* 96* 100* 103  CO2 _0 GLUCOSE 109* 98 97 91 103*  BUN <5* _1 CREATININE 0.79 2.39* 3.31* 3.66* 3.81*  CALCIUM 9.2 8.9 8.7* 8.4* 8.8*  MG 1.9  --  1.9  --   --   PHOS  --   --   --  6.7*  --    Liver Function Tests:  Recent Labs Lab 01/08/17 0331  ALBUMIN 1.8*    CBC:  Recent Labs Lab 01/03/17 0345 01/04/17 0500 01/06/17 0400 01/08/17 0500 01/08/17 1245 01/09/17 0351  WBC 8.4 8.1 8.4 8.7  --  7.4  HGB 9.5* 9.6* 9.1* 7.6* 8.4* 8.0*  HCT 31.0* 31.5* 30.2* 24.4* 27.5* 25.7*  MCV 92.5 92.9 94.7 92.4  --  93.8  PLT 287  282 253 236  --  254   CBG:  Recent Labs Lab 01/03/17 2141 01/04/17 0817 01/04/17 1210 01/04/17 1558 01/04/17 1938  GLUCAP 102* 104* 94 146* 76   Radiology: Dg Chest 2 View  Result Date: 01/08/2017 CLINICAL DATA:  51 year old female status post left VATS drainage and debridement of left lower lobe lung abscess 12/26/2016. Initial encounter. EXAM: CHEST  2 VIEW COMPARISON:  01/07/2017 and earlier FINDINGS: Seated AP and lateral views of the chest. Stable left PICC line. Stable lung volumes, within normal limits. Stable cardiac size and mediastinal contours. No pneumothorax or pulmonary edema. No definite pleural effusion. Residual mixed patchy and cavitary opacity in the left lung is stable. Superimposed mild bibasilar atelectasis. Multiple right lateral rib fractures. No acute osseous abnormality identified. Stable cholecystectomy clips. Negative visible bowel gas pattern. IMPRESSION: 1. Stable residual mixed patchy and cavitary opacity in the left lung. Mild bibasilar atelectasis. No pneumothorax identified and no definite pleural effusion. 2. No new cardiopulmonary abnormality. Electronically Signed   By: Genevie Ann M.D.   On: 01/08/2017 08:29   US Renal  Result Date: 01/07/2017 CLINICAL DATA:  Acute renal failure EXAM: RENAL / URINARY TRACT ULTRASOUND COMPLETE COMPARISON:  None. FINDINGS: Right Kidney:  Length: 13.5 cm. Echogenicity within normal limits. No mass or hydronephrosis visualized. Left Kidney: Length: 11.2 cm. Echogenicity within normal limits. No mass or hydronephrosis visualized. Assessment of the left kidney was slightly limited due to overlying bandaging. Bladder: Appears normal for degree of bladder distention. IMPRESSION: No obstructive uropathy or renal mass. No nephrolithiasis. Cortical-medullary distinction is maintained within both kidneys. Electronically Signed   By: Ashley Royalty M.D.   On: 01/07/2017 19:40   CXR 1/19>> Consolidations bilaterally CT head 1/19>> Left occipital lobe probable acute or early subacute infarction. No acute intracranial hemorrhage. 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 There is a small amount of fluid in this structure. Also a 1 cm nodular structure in the left lower lobe with minimal cavitation.  Abd xr 1/27 >> bowel distended with gas CT Chest 2/1 >> Moderate left hydropneumothorax, no change in size of pulmonary abscess in the lingula, interval increase in loculated fluid at the left lung base with scattered air pockets and lower lobe atelectasis  Medications:   . budesonide (PULMICORT) nebulizer solution  0.5 mg Nebulization BID  . feeding supplement (ENSURE ENLIVE)  237 mL Oral TID BM  . fluconazole  200 mg Oral Daily  . linezolid  600 mg Oral Q12H  . mouth rinse  15 mL Mouth Rinse BID  . metoprolol tartrate  25 mg Oral BID  . pantoprazole  40 mg Oral Q1200  . QUEtiapine  100 mg Oral QHS  . senna-docusate  1 tablet Oral QHS  . sodium chloride flush  10-40 mL Intracatheter Q12H  . vancomycin  250 mg Oral Q6H   Continuous Infusions: . sodium chloride 50 mL/hr at 01/07/17 1811     LOS: 28 days   Faye Ramsay,  MD [facility] Pager [phone]. If unable to reach me by pager, please call my cell phone at [phone]  Please refer to [email or web address], password [email or web address] to get updated schedule on who will round on this patient, as hospitalists switch teams weekly.  If 7PM-7AM, please contact  night-coverage at [email or web address], password [email or web address] for any overnight needs.  01/09/2017, 3:54 PM

## 2017-01-10 LAB — RENAL FUNCTION PANEL
ALBUMIN: 2 g/dL — AB (ref 3.5–5.0)
Anion gap: 8 (ref 5–15)
BUN: 13 mg/dL (ref 6–20)
CALCIUM: 9.1 mg/dL (ref 8.9–10.3)
CO2: 27 mmol/L (ref 22–32)
CREATININE: 3.75 mg/dL — AB (ref 0.44–1.00)
Chloride: 103 mmol/L (ref 101–111)
GFR calc Af Amer: 15 mL/min — ABNORMAL LOW (ref >60)
GFR calc non Af Amer: 13 mL/min — ABNORMAL LOW (ref >60)
GLUCOSE: 93 mg/dL (ref 65–99)
PHOSPHORUS: 6.7 mg/dL — AB (ref 2.5–4.6)
Potassium: 4.2 mmol/L (ref 3.5–5.1)
SODIUM: 138 mmol/L (ref 135–145)

## 2017-01-10 LAB — CBC
HCT: 25.4 % — ABNORMAL LOW (ref 36.0–46.0)
Hemoglobin: 7.8 g/dL — ABNORMAL LOW (ref 12.0–15.0)
MCH: 28.6 pg (ref 26.0–34.0)
MCHC: 30.7 g/dL (ref 30.0–36.0)
MCV: 93 fL (ref 78.0–100.0)
PLATELETS: 283 10*3/uL (ref 150–400)
RBC: 2.73 MIL/uL — ABNORMAL LOW (ref 3.87–5.11)
RDW: 15.5 % (ref 11.5–15.5)
WBC: 4.7 10*3/uL (ref 4.0–10.5)

## 2017-01-10 MED ORDER — HYDROCODONE-HOMATROPINE 5-1.5 MG/5ML PO SYRP
5.0000 mL | ORAL_SOLUTION | ORAL | Status: DC | PRN
  Administered 2017-01-10 – 2017-01-12 (×9): 5 mL via ORAL
  Filled 2017-01-10 (×9): qty 5

## 2017-01-10 NOTE — Clinical Social Work Placement (Signed)
   CLINICAL SOCIAL WORK PLACEMENT  NOTE  Date:  01/10/2017  Patient Details  Name: [name] MRN: [id number] Date of Birth: 1966-07-14  Clinical Social Work is seeking post-discharge placement for this patient at the [place] level of care (*CSW will initial, date and re-position this form in  chart as items are completed):  Yes   Patient/family provided with [place] Work Department's list of facilities offering this level of care within the geographic area requested by the patient (or if unable, by the patient's family).  Yes   Patient/family informed of their freedom to choose among providers that offer the needed level of care, that participate in Medicare, Medicaid or managed care program needed by the patient, have an available bed and are willing to accept the patient.  Yes   Patient/family informed of [facility]'s ownership interest in [facility] and [facility], as well as of the fact that they are under no obligation to receive care at these facilities.  PASRR submitted to EDS on 12/26/16     PASRR number received on 01/10/17     Existing PASRR number confirmed on       FL2 transmitted to all facilities in geographic area requested by pt/family on 01/10/17     FL2 transmitted to all facilities within larger geographic area on 01/10/17     Patient informed that his/her managed care company has contracts with or will negotiate with certain facilities, including the following:            Patient/family informed of bed offers received.  Patient chooses bed at       Physician recommends and patient chooses bed at      Patient to be transferred to   on  .  Patient to be transferred to facility by       Patient family notified on   of transfer.  Name of family member notified:        PHYSICIAN Please prepare priority discharge summary, including medications, Please prepare prescriptions, Please sign FL2     Additional  Comment:    _______________________________________________ Rigoberto Noel, LCSW 01/10/2017, 9:34 AM

## 2017-01-10 NOTE — NC FL2 (Signed)
[place] LEVEL OF CARE SCREENING TOOL     IDENTIFICATION  Patient Name: [name] Birthdate: Oct 23, 1966 Sex: female Admission Date (Current Location): 12/12/2016  [facility] and [place] Number:  Herbalist and Address:  [facility] [place], [place] [address] [place], [place] [address]      Provider Number: [id number]  Attending Physician Name and Address:  Theodis Blaze, MD  Relative Name and Phone Number:       Current Level of Care: Hospital Recommended Level of Care: [place] Prior Approval Number:    Date Approved/Denied: (P) 12/23/16 PASRR Number: [id number] (Expires 02/08/17)  Discharge Plan: SNF    Current Diagnoses: Patient Active Problem List   Diagnosis Date Noted  . H/O chest tube placement   . Candida infection   . Coagulase-negative staphylococcal infection   . Asthma 01/03/2017  . Seasonal allergies 01/03/2017  . Normocytic anemia 01/03/2017  . Herpes labialis 01/03/2017  . Empyema of pleural space ([place]) 12/26/2016  . Abscess of lower lobe of left lung with pneumonia ([place])   . Ileus ([place])   . Pneumothorax   . Pneumonia of left upper lobe due to Streptococcus pneumoniae ([place])   . Clostridium difficile colitis   . Acute kidney injury ([place])   . HCAP (healthcare-associated pneumonia)   . Transaminitis   . Pressure injury of skin 12/19/2016  . MDD (major depressive disorder), recurrent severe, without psychosis ([place]) 12/22/2015  . Acute respiratory failure ([place]) 12/13/2015  . IBS (irritable bowel syndrome) 12/13/2015  . COPD with acute exacerbation ([place])   . Essential hypertension   . Encephalopathy acute 12/03/2015  . Chronic narcotic dependence ([place]) 11/22/2015  . Hypercalcemia 11/22/2015  . Chronic hyponatremia 03/07/2015  . Cigarette nicotine dependence without complication [id number]  . Fibromyalgia syndrome 01/09/2015  . Mixed hyperlipidemia 01/09/2015  . Chronic low back pain  03/06/2014    Orientation RESPIRATION BLADDER Height & Weight     Self, Place  O2 (3L) Continent Weight: 79.8 kg (176 lb) Height:  5' 1.5" (156.2 cm)  BEHAVIORAL SYMPTOMS/MOOD NEUROLOGICAL BOWEL NUTRITION STATUS     (NONE) Continent Diet (Regular)  AMBULATORY STATUS COMMUNICATION OF NEEDS Skin   Extensive Assist Verbally Other (Comment) (Unstageable wound upper lip. Stage II left buttocks, incision chest left)                       Personal Care Assistance Level of Assistance  Bathing, Feeding, Dressing Bathing Assistance: Limited assistance Feeding assistance: Independent Dressing Assistance: Limited assistance     Functional Limitations Info  Sight, Hearing, Speech Sight Info: Adequate Hearing Info: Adequate Speech Info: Adequate    SPECIAL CARE FACTORS FREQUENCY  PT (By licensed PT), OT (By licensed OT)     PT Frequency: 3/week OT Frequency: 3/week            Contractures Contractures Info: Not present    Additional Factors Info  Code Status, Allergies, Isolation Precautions Code Status Info: Full Code Allergies Info: Cymbalta Duloxetine Hcl, Mucinex Guaifenesin Er, Nyquil Multi-symptom Pseudoeph-doxylamine-dm-apap, Paxil Paroxetine Hcl, Promethazine, Tramadol, Wellbutrin Bupropion, Zoloft Sertraline     Isolation Precautions Info: Enteric precautions (UV disinfection)     Current Medications (01/10/2017):  This is the current hospital active medication list Current Facility-Administered Medications  Medication Dose Route Frequency Provider Last Rate Last Dose  . acetaminophen (TYLENOL) tablet 650 mg  650 mg Oral Q6H PRN Gardiner Barefoot, NP   650 mg  at 01/07/17 1142  . budesonide (PULMICORT) nebulizer solution 0.5 mg  0.5 mg Nebulization BID Elgie Collard, PA-C   0.5 mg at 01/09/17 2033  . feeding supplement (ENSURE ENLIVE) (ENSURE ENLIVE) liquid 237 mL  237 mL Oral TID BM Ivin Poot, MD   237 mL at 01/10/17 1000  . fluconazole (DIFLUCAN) tablet  200 mg  200 mg Oral Daily Theodis Blaze, MD   200 mg at 01/10/17 [id number]  . Gerhardt's butt cream   Topical PRN Gaye Pollack, MD      . hydrALAZINE (APRESOLINE) injection 5 mg  5 mg Intravenous Q4H PRN Theodis Blaze, MD   5 mg at 01/10/17 0221  . hydrALAZINE (APRESOLINE) tablet 10 mg  10 mg Oral Q8H Theodis Blaze, MD   10 mg at 01/10/17 0552  . ipratropium-albuterol (DUONEB) 0.5-2.5 (3) MG/3ML nebulizer solution 3 mL  3 mL Nebulization Q6H PRN Cherene Altes, MD      . linezolid (ZYVOX) tablet 600 mg  600 mg Oral Q12H Truman Hayward, MD   600 mg at 01/10/17 [id number]  . MEDLINE mouth rinse  15 mL Mouth Rinse BID Ivin Poot, MD   15 mL at 01/09/17 0917  . metoprolol tartrate (LOPRESSOR) tablet 25 mg  25 mg Oral BID Gaye Pollack, MD   25 mg at 01/10/17 0906  . naloxone [facility]) injection 0.4 mg  0.4 mg Intravenous PRN Venetia Maxon Rama, MD   0.4 mg at 01/06/17 1359  . ondansetron (ZOFRAN) injection 4 mg  4 mg Intravenous Q6H PRN Elgie Collard, PA-C   4 mg at 01/07/17 2112  . oxyCODONE (Oxy IR/ROXICODONE) immediate release tablet 5 mg  5 mg Oral Q6H PRN Theodis Blaze, MD   5 mg at 01/10/17 0906  . pantoprazole (PROTONIX) EC tablet 40 mg  40 mg Oral Q1200 Ivin Poot, MD   40 mg at 01/09/17 1147  . potassium chloride 30 mEq in sodium chloride 0.9 % 265 mL (KCL MULTIRUN) IVPB  30 mEq Intravenous Daily PRN Elgie Collard, PA-C   30 mEq at 01/03/17 0600  . QUEtiapine (SEROQUEL) tablet 100 mg  100 mg Oral QHS Theodis Blaze, MD   100 mg at 01/09/17 2204  . senna-docusate (Senokot-S) tablet 1 tablet  1 tablet Oral QHS Tessa N Conte, PA-C      . sodium chloride flush (NS) 0.9 % injection 10-40 mL  10-40 mL Intracatheter Q12H Ivin Poot, MD   10 mL at 01/09/17 2200  . sodium chloride flush (NS) 0.9 % injection 10-40 mL  10-40 mL Intracatheter PRN Ivin Poot, MD      . vancomycin (VANCOCIN) 50 mg/mL oral solution 250 mg  250 mg Oral Q6H Truman Hayward, MD   250 mg at 01/10/17 [id number]      Discharge Medications: Please see discharge summary for a list of discharge medications.  Relevant Imaging Results:  Relevant Lab Results:   Additional Information SSN: [id number]  Rigoberto Noel, LCSW

## 2017-01-10 NOTE — Progress Notes (Addendum)
Physical Therapy Treatment Patient Details Name: [name] MRN: [id number] DOB: 08/27/1966 Today's Date: 01/10/2017    History of Present Illness 51 y/o F, 68 pk year smoker, with Hx of COPD/asthma and bipolar disorder admitted 1/19 with SOB.  Working diagnosis of acute asthma exacerbation, multilobar PNA with resultant hypoxic respiratory failure.  Intubated 12/12/16 to 12/20/16.  Extubated 1/28. Pt transferred to step down on 1/29 with ileus. NG placed which pt pulled out.    PT Comments    Improved gait distance and decreased dyspnea with ambulation today. She was still SOB after ambulation but decreased from previous visit. She would benefit from further therapy to improve her endurance.   Follow Up Recommendations  SNF;Supervision/Assistance - 24 hour     Equipment Recommendations  Rolling walker with 5" wheels    Recommendations for Other Services       Precautions / Restrictions Precautions Precautions: Fall Restrictions Weight Bearing Restrictions: No    Mobility  Bed Mobility Overal bed mobility: Needs Assistance Bed Mobility: Supine to Sit     Supine to sit: HOB elevated;Supervision Sit to supine: Supervision Sit to sidelying: Min guard General bed mobility comments: fatigued when getting back to bed. Cuing to sit in the bed instead of climbing in. Min a to slide to the head of the bed.   Transfers Overall transfer level: Needs assistance Equipment used: Rolling walker (2 wheeled)   Sit to Stand: Min guard Stand pivot transfers: Min guard       General transfer comment: required cuing for safety. Was abel to get to the commode and back  Ambulation/Gait Ambulation/Gait assistance: Min assist Ambulation Distance (Feet): 40 Feet (10'x2 20'x1)   Gait Pattern/deviations: Step-through pattern;Decreased stride length;Trunk flexed;Shuffle   Gait velocity interpretation: Below normal speed for age/gender General Gait Details: Patient ambualted to the  bathroom. Then walked back to the bed. After a d short rest break she walked back to the door and back to bed. She was SOB but was not as SOB as treaeatment yesterday with more walking    Stairs            Wheelchair Mobility    Modified Rankin (Stroke Patients Only)       Balance Overall balance assessment: Needs assistance Sitting-balance support: Feet supported;Bilateral upper extremity supported Sitting balance-Leahy Scale: Good Sitting balance - Comments: sat EOB weithout assitance    Standing balance support: Bilateral upper extremity supported Standing balance-Leahy Scale: Poor Standing balance comment: needed assistance for balance                     Cognition Arousal/Alertness: Awake/alert Behavior During Therapy: WFL for tasks assessed/performed Overall Cognitive Status: No family/caregiver present to determine baseline cognitive functioning Area of Impairment: Safety/judgement;Problem solving                    Exercises      General Comments General comments (skin integrity, edema, etc.): 3L of NCo2       Pertinent Vitals/Pain Pain Assessment: No/denies pain Pain Score: 0-No pain    Home Living                      Prior Function            PT Goals (current goals can now be found in the care plan section) Acute Rehab PT Goals Patient Stated Goal: to go home PT Goal Formulation: With patient Time For Goal Achievement: 01/16/17 Potential  to Achieve Goals: Good Progress towards PT goals: Progressing toward goals    Frequency    Min 3X/week      PT Plan Current plan remains appropriate    Co-evaluation             End of Session Equipment Utilized During Treatment: Gait belt;Oxygen Activity Tolerance: Patient tolerated treatment well;No increased pain;Patient limited by fatigue Patient left: with call bell/phone within reach;in bed     Time: [id number] PT Time Calculation (min) (ACUTE ONLY): 15  min  Charges:  $Gait Training: 8-22 mins                    G Codes:      Carney Living PT DPT  01/10/2017, 4:23 PM

## 2017-01-10 NOTE — Progress Notes (Signed)
Progress Note    [name]  [id number] DOB: 01-04-1966  DOA: 12/12/2016   PCP: [name], MD   Brief Narrative:   Chief complaint: Follow-up pneumonia/empyema/C. difficile colitis  51 y.o. female with a PMH of 85 pack year history of tobacco abuse, asthma, allergic rhinitis, COPD, hypertension and bipolar disorder who was admitted by the critical care team 12/12/16 with acute hypoxic respiratory failure, asthma exacerbation and multilobar pneumonia. She was also encephalopathic on admission. Her hospital course has been complicated by the development of C. difficile colitis, Streptococcus pneumonia lung abscess/empyema status post VATS on 12/26/16, ileus, pneumothorax, VDRF requiring intubation (intubated 12/12/16-12/21/16, reintubated 12/26/16-12/29/16), and demand ischemia. Patient transferred to the care of [facility] on 01/03/17.  Culture data is as follows: 1/19 legionella: negative 1/19 blood cx: neg 1/20 mrsa pcr: neg 1/20 rvp: neg 1/20 resp cx: abundant strep pneumo 1/20 strep pneumo: positive 1/22 HSV swab: HSV1 positive 1/24 BCx: neg 1/28 c diff + 2/2 bronch washing- few candida albicans, dubliniensis 2/2 LLL lung, fungal cx: candida albicans 2/2 LLL wound cx, deep: few candida albicans, rare MRSE  Assessment/Plan:   Principal Problem:   Acute respiratory failure with hypoxia ([place]), Multifactorial - Required intubation 12/12/16-12/21/16 with reintubation 12/26/16 through 12/29/16. Continue to wean oxygen.  - Continue aggressive pulmonary toilet. Continue duo nebs every 6 hours and Pulmicort twice a day.  - Respiratory status remains stable.     Pneumonia with cavity of lung/Abscess of lower lobe of left lung with pneumonia (HCC)/Empyema of pleural space (HCC)/Pneumonia of left upper lobe due to Streptococcus pneumoniae (HCC)/HCAP/pneumothorax/sepsis - Initial respiratory cultures grew strep pneumonia with a positive strep pneumonia antigen.  - S/P VATS 12/26/16. OR cultures  + staph epidermidis. BAL cultures + for Candida.  - Continued vancomycin for MRSE, and Diflucan for Candida. Dr. Megan Salon of ID following for rec's regarding duration of therapy, ABX changed to Linezolid  - chest tubes have all been removed, CXR is free from pneumothora - CTS will arrange 2 week follow up for patient, chest tube sutures can be removed at discharge,  Active Problems:    AKI - Cr continues trending up from 2.39 --> 3.31 --> 3.66 --> 3.8 --> 3.7 this AM  - bit concerned that Vancomycin could be offending agent here but this is not clear at this point  - IV vancomycin has been stopped and ABX changed to Linezolid - renal US with no signs of hydronephrosis, UNa and UCr requested - BMP in AM - Lovenox has also been stopped and pt placed on SCD's instead     Nausea and vomiting - resolved - tolerating diet well     Acute encephalopathy secondary to sepsis/bipolar disorder - improved and pt more alert this am - resume Seroquel today     Dysphagia/ileus - Initially managed with TPN given ileus - diet advanced to regular     Pressure injury of skin - Skin care per nursing.    Hypertension, essential  - Continue scheduled Lopressor and when necessary hydralazine    Transaminitis - resolved     Enteritis due to Clostridium difficile/abdominal distention - Initially treated with vancomycin enemas and IV Flagyl. Continue oral vancomycin.  - Having formed stools per nursing.    Physical deconditioning - SNF recommended but patient is declining at present.    Normocytic anemia - no sings of active bleeding but noted Hg drop overnight, repeat Hg check to make sure no blood work error  - stop  Lovenox for now - CBC again in AM    Hypomagnesemia - supplemented, repeat Mg in AM  Family Communication/Anticipated D/C date and plan/Code Status   DVT prophylaxis: Lovenox SQ Code Status: Full Code.  Family Communication: No family present at the bedside.  Disposition  Plan: SNF by Monday   Medical Consultants:    CVTS  PCCM  Neurology  ID  Procedures:    VATS2/2/18  Anti-Infectives:   2/6 vanc>> changed to Linezolid  2/3 eraxis>>2/6 2/2 zosyn>>2/7 1/29 flagyl>> 2/2; resumed 2/3>> 1/30 vanc enema>>2/7 Fluconazole PO 2/7>> Vanc PO 2/8>> 1/19 ceftriaxone > 1/24 1/19 azithromycin > 1/22 1/22 acyclovir > 1/29 1/24 unasyn >>2/2  Subjective:   No concerns this AM, wants to go home.   Objective:   Vitals:   01/09/17 2129 01/10/17 0217 01/10/17 0438 01/10/17 0911  BP: (!) 175/96 (!) 163/93 (!) 173/118 (!) 160/96  Pulse: 88 86 86   Resp: 18  20   Temp: 98 F (36.7 C)  98.9 F (37.2 C)   TempSrc:      SpO2: 93%  98%   Weight:   79.8 kg (176 lb)   Height:        Intake/Output Summary (Last 24 hours) at 01/10/17 1147 Last data filed at 01/10/17 0913  Gross per 24 hour  Intake             1230 ml  Output                0 ml  Net             1230 ml   Filed Weights   01/08/17 0525 01/09/17 0344 01/10/17 0438  Weight: 71.9 kg (158 lb 8.2 oz) 72 kg (158 lb 11.2 oz) 79.8 kg (176 lb)    Exam: General exam: Alert but slow to respond, follows commands appropriately  Respiratory system: Diminished breath sounds at bases, better overall air movement  Cardiovascular system: regular rhythm. No JVD,  rubs, gallops or clicks. No murmurs. Gastrointestinal system: Abdomen is nondistended, soft and nontender. No organomegaly or masses felt.  Central nervous system: alert and oriented to name, DOB, place   Data Reviewed:   I have personally reviewed following labs and imaging studies:  Labs: Basic Metabolic Panel:  Recent Labs Lab 01/04/17 0500 01/06/17 0400 01/07/17 0500 01/08/17 0331 01/09/17 0351 01/10/17 0602  NA 137 133* 134* 134* 138 138  K 3.8 4.2 4.5 4.4 4.4 4.2  CL 95* 91* 96* 100* 103 103  CO2 _0 GLUCOSE 109* 98 97 91 103* 93  BUN <5* _1 CREATININE 0.79 2.39* 3.31* 3.66* 3.81* 3.75*   CALCIUM 9.2 8.9 8.7* 8.4* 8.8* 9.1  MG 1.9  --  1.9  --   --   --   PHOS  --   --   --  6.7*  --  6.7*   Liver Function Tests:  Recent Labs Lab 01/08/17 0331 01/10/17 0602  ALBUMIN 1.8* 2.0*    CBC:  Recent Labs Lab 01/04/17 0500 01/06/17 0400 01/08/17 0500 01/08/17 1245 01/09/17 0351 01/10/17 0601  WBC 8.1 8.4 8.7  --  7.4 4.7  HGB 9.6* 9.1* 7.6* 8.4* 8.0* 7.8*  HCT 31.5* 30.2* 24.4* 27.5* 25.7* 25.4*  MCV 92.9 94.7 92.4  --  93.8 93.0  PLT 282 253 236  --  254 283   CBG:  Recent Labs Lab 01/03/17 2141 01/04/17 0817 01/04/17 1210  01/04/17 1558 01/04/17 1938  GLUCAP 102* 104* 94 146* 86   Radiology: No results found. CXR 1/19>> Consolidations bilaterally CT head 1/19>> Left occipital lobe probable acute or early subacute infarction. No acute intracranial hemorrhage. 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 There is a small amount of fluid in this structure. Also a 1 cm nodular structure in the left lower lobe with minimal cavitation.  Abd xr 1/27 >> bowel distended with gas CT Chest 2/1 >> Moderate left hydropneumothorax, no change in size of pulmonary abscess in the lingula, interval increase in loculated fluid at the left lung base with scattered air pockets and lower lobe atelectasis  Medications:   . budesonide (PULMICORT) nebulizer solution  0.5 mg Nebulization BID  . feeding supplement (ENSURE ENLIVE)  237 mL Oral TID BM  . fluconazole  200 mg Oral Daily  . hydrALAZINE  10 mg Oral Q8H  . linezolid  600 mg Oral Q12H  . mouth rinse  15 mL Mouth Rinse BID  . metoprolol tartrate  25 mg Oral BID  . pantoprazole  40 mg Oral Q1200  . QUEtiapine  100 mg Oral QHS  . senna-docusate  1 tablet Oral QHS  . sodium chloride flush  10-40 mL  Intracatheter Q12H  . vancomycin  250 mg Oral Q6H   Continuous Infusions:    LOS: 29 days   Faye Ramsay, MD [facility] Pager [phone]. If unable to reach me by pager, please call my cell phone at [phone]  Please refer to [email or web address], password [email or web address] to get updated schedule on who will round on this patient, as hospitalists switch teams weekly.  If 7PM-7AM, please contact night-coverage at [email or web address], password [email or web address] for any overnight needs.  01/10/2017, 11:47 AM

## 2017-01-11 LAB — CBC
HCT: 28 % — ABNORMAL LOW (ref 36.0–46.0)
Hemoglobin: 8.4 g/dL — ABNORMAL LOW (ref 12.0–15.0)
MCH: 27.8 pg (ref 26.0–34.0)
MCHC: 30 g/dL (ref 30.0–36.0)
MCV: 92.7 fL (ref 78.0–100.0)
PLATELETS: 332 10*3/uL (ref 150–400)
RBC: 3.02 MIL/uL — ABNORMAL LOW (ref 3.87–5.11)
RDW: 15.7 % — AB (ref 11.5–15.5)
WBC: 6.1 10*3/uL (ref 4.0–10.5)

## 2017-01-11 LAB — BASIC METABOLIC PANEL
ANION GAP: 10 (ref 5–15)
BUN: 13 mg/dL (ref 6–20)
CALCIUM: 9.4 mg/dL (ref 8.9–10.3)
CO2: 28 mmol/L (ref 22–32)
Chloride: 101 mmol/L (ref 101–111)
Creatinine, Ser: 3.75 mg/dL — ABNORMAL HIGH (ref 0.44–1.00)
GFR calc Af Amer: 15 mL/min — ABNORMAL LOW (ref >60)
GFR, EST NON AFRICAN AMERICAN: 13 mL/min — AB (ref >60)
GLUCOSE: 89 mg/dL (ref 65–99)
Potassium: 4.3 mmol/L (ref 3.5–5.1)
Sodium: 139 mmol/L (ref 135–145)

## 2017-01-11 MED ORDER — KETOROLAC TROMETHAMINE 30 MG/ML IJ SOLN
30.0000 mg | Freq: Four times a day (QID) | INTRAMUSCULAR | Status: DC | PRN
  Administered 2017-01-11 – 2017-01-12 (×3): 30 mg via INTRAVENOUS
  Filled 2017-01-11 (×3): qty 1

## 2017-01-11 MED ORDER — HYDRALAZINE HCL 25 MG PO TABS
25.0000 mg | ORAL_TABLET | Freq: Three times a day (TID) | ORAL | Status: DC
  Administered 2017-01-11 – 2017-01-12 (×3): 25 mg via ORAL
  Filled 2017-01-11 (×4): qty 1

## 2017-01-11 NOTE — Progress Notes (Signed)
Progress Note    [name]  FBP:[id number] DOB: November 18, 1966  DOA: 12/12/2016   PCP: [name], MD   Brief Narrative:   Chief complaint: Follow-up pneumonia/empyema/C. difficile colitis  51 y.o. female with a PMH of 12 pack year history of tobacco abuse, asthma, allergic rhinitis, COPD, hypertension and bipolar disorder who was admitted by the critical care team 12/12/16 with acute hypoxic respiratory failure, asthma exacerbation and multilobar pneumonia. She was also encephalopathic on admission. Her hospital course has been complicated by the development of C. difficile colitis, Streptococcus pneumonia lung abscess/empyema status post VATS on 12/26/16, ileus, pneumothorax, VDRF requiring intubation (intubated 12/12/16-12/21/16, reintubated 12/26/16-12/29/16), and demand ischemia. Patient transferred to the care of [facility] on 01/03/17.  Culture data is as follows: 1/19 legionella: negative 1/19 blood cx: neg 1/20 mrsa pcr: neg 1/20 rvp: neg 1/20 resp cx: abundant strep pneumo 1/20 strep pneumo: positive 1/22 HSV swab: HSV1 positive 1/24 BCx: neg 1/28 c diff + 2/2 bronch washing- few candida albicans, dubliniensis 2/2 LLL lung, fungal cx: candida albicans 2/2 LLL wound cx, deep: few candida albicans, rare MRSE  Assessment/Plan:   Principal Problem:   Acute respiratory failure with hypoxia ([place]), Multifactorial - Required intubation 12/12/16-12/21/16 with reintubation 12/26/16 through 12/29/16. Continue to wean oxygen.  - Continue aggressive pulmonary toilet. Continue duo nebs every 6 hours and Pulmicort twice a day.  - Respiratory status remains stable.     Pneumonia with cavity of lung/Abscess of lower lobe of left lung with pneumonia (HCC)/Empyema of pleural space (HCC)/Pneumonia of left upper lobe due to Streptococcus pneumoniae (HCC)/HCAP/pneumothorax/sepsis - Initial respiratory cultures grew strep pneumonia with a positive strep pneumonia antigen.  - S/P VATS 12/26/16. OR cultures  + staph epidermidis. BAL cultures + for Candida.  - Continued vancomycin for MRSE, and Diflucan for Candida. Dr. Megan Salon of ID following for rec's regarding duration of therapy, ABX changed to Linezolid  - chest tubes have all been removed, CXR is free from pneumothora - CTS will arrange 2 week follow up for patient, chest tube sutures can be removed at discharge,  Active Problems:    AKI - Cr continues trending up from 2.39 --> 3.31 --> 3.66 --> 3.8 --> 3.7 --> 3.7 again this AM - bit concerned that Vancomycin could be offending agent here but this is not clear at this point  - IV vancomycin has been stopped and ABX changed to Linezolid - renal US with no signs of hydronephrosis, UNa and UCr requested - BMP in AM - Lovenox has also been stopped and pt placed on SCD's instead     Nausea and vomiting - resolved - tolerating diet well     Acute encephalopathy secondary to sepsis/bipolar disorder - improved and pt more alert this am - continue Seroquel     Dysphagia/ileus - Initially managed with TPN given ileus - diet advanced and pt tolerating well     Pressure injury of skin - Skin care per nursing.    Hypertension, essential  - Continue scheduled Lopressor and when necessary hydralazine    Transaminitis - resolved     Enteritis due to Clostridium difficile/abdominal distention - Initially treated with vancomycin enemas and IV Flagyl. Continue oral vancomycin.  - Having formed stools per nursing.    Physical deconditioning - SNF recommended but patient is declining at present.    Normocytic anemia - no sings of active bleeding but noted Hg drop overnight, repeat Hg check to make sure no blood work error  -  stop Lovenox for now - CBC again in AM    Hypomagnesemia - supplemented, repeat Mg in AM  Family Communication/Anticipated D/C date and plan/Code Status   DVT prophylaxis: Lovenox SQ Code Status: Full Code.  Family Communication: No family present at the  bedside.  Disposition Plan: SNF by Monday   Medical Consultants:    CVTS  PCCM  Neurology  ID  Procedures:    VATS2/2/18  Anti-Infectives:   2/6 vanc>> changed to Linezolid  2/3 eraxis>>2/6 2/2 zosyn>>2/7 1/29 flagyl>> 2/2; resumed 2/3>> 1/30 vanc enema>>2/7 Fluconazole PO 2/7>> Vanc PO 2/8>> 1/19 ceftriaxone > 1/24 1/19 azithromycin > 1/22 1/22 acyclovir > 1/29 1/24 unasyn >>2/2  Subjective:   No concerns this AM, wants to be discharged.  Objective:   Vitals:   01/11/17 0514 01/11/17 0839 01/11/17 1312 01/11/17 1314  BP: (!) 160/88 (!) 178/100  (!) 173/98  Pulse: 78     Resp: 20 18    Temp: 98.3 F (36.8 C) 98.2 F (36.8 C) 98 F (36.7 C)   TempSrc:  Oral Oral   SpO2: 98% 97%    Weight: 79.2 kg (174 lb 11.2 oz)     Height:        Intake/Output Summary (Last 24 hours) at 01/11/17 1344 Last data filed at 01/11/17 1300  Gross per 24 hour  Intake             1220 ml  Output              200 ml  Net             1020 ml   Filed Weights   01/09/17 0344 01/10/17 0438 01/11/17 0514  Weight: 72 kg (158 lb 11.2 oz) 79.8 kg (176 lb) 79.2 kg (174 lb 11.2 oz)    Exam: General exam: Alert but slow to respond, follows commands appropriately  Respiratory system: Diminished breath sounds at bases, better overall air movement  Cardiovascular system: regular rhythm. No JVD,  rubs, gallops or clicks. No murmurs. Gastrointestinal system: Abdomen is nondistended, soft and nontender. No organomegaly or masses felt.  Central nervous system: alert and oriented to name, DOB, place   Data Reviewed:   I have personally reviewed following labs and imaging studies:  Labs: Basic Metabolic Panel:  Recent Labs Lab 01/07/17 0500 01/08/17 0331 01/09/17 0351 01/10/17 0602 01/11/17 0419  NA 134* 134* 138 138 139  K 4.5 4.4 4.4 4.2 4.3  CL 96* 100* 103 103 101  CO2 _0 GLUCOSE 97 91 103* 93 89  BUN _1 CREATININE 3.31* 3.66* 3.81* 3.75*  3.75*  CALCIUM 8.7* 8.4* 8.8* 9.1 9.4  MG 1.9  --   --   --   --   PHOS  --  6.7*  --  6.7*  --    Liver Function Tests:  Recent Labs Lab 01/08/17 0331 01/10/17 0602  ALBUMIN 1.8* 2.0*    CBC:  Recent Labs Lab 01/06/17 0400 01/08/17 0500 01/08/17 1245 01/09/17 0351 01/10/17 0601 01/11/17 0419  WBC 8.4 8.7  --  7.4 4.7 6.1  HGB 9.1* 7.6* 8.4* 8.0* 7.8* 8.4*  HCT 30.2* 24.4* 27.5* 25.7* 25.4* 28.0*  MCV 94.7 92.4  --  93.8 93.0 92.7  PLT 253 236  --  254 283 332   CBG:  Recent Labs Lab 01/04/17 1558 01/04/17 1938  GLUCAP 146* 69   Radiology: No results found. CXR 1/19>> Consolidations bilaterally  CT head 1/19>> Left occipital lobe probable acute or early subacute infarction. No acute intracranial hemorrhage. 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 There is a small amount of fluid in this structure. Also a 1 cm nodular structure in the left lower lobe with minimal cavitation.  Abd xr 1/27 >> bowel distended with gas CT Chest 2/1 >> Moderate left hydropneumothorax, no change in size of pulmonary abscess in the lingula, interval increase in loculated fluid at the left lung base with scattered air pockets and lower lobe atelectasis  Medications:   . budesonide (PULMICORT) nebulizer solution  0.5 mg Nebulization BID  . feeding supplement (ENSURE ENLIVE)  237 mL Oral TID BM  . fluconazole  200 mg Oral Daily  . hydrALAZINE  10 mg Oral Q8H  . linezolid  600 mg Oral Q12H  . mouth rinse  15 mL Mouth Rinse BID  . metoprolol tartrate  25 mg Oral BID  . pantoprazole  40 mg Oral Q1200  . QUEtiapine  100 mg Oral QHS  . senna-docusate  1 tablet Oral QHS  . sodium chloride flush  10-40 mL Intracatheter Q12H  . vancomycin  250 mg Oral Q6H   Continuous  Infusions:    LOS: 30 days   Faye Ramsay, MD [facility] Pager [phone]. If unable to reach me by pager, please call my cell phone at [phone]  Please refer to [email or web address], password [email or web address] to get updated schedule on who will round on this patient, as hospitalists switch teams weekly.  If 7PM-7AM, please contact night-coverage at [email or web address], password [email or web address] for any overnight needs.  01/11/2017, 1:44 PM

## 2017-01-11 NOTE — Clinical Social Work Note (Signed)
CSW called pt's dtr to discuss bed offers, per dtr pt found out since she has medicaid she will have to stay at SNF 30 days, pt does not want to stay that long. Pt will now likely go home, pt understands that it will be difficult to get medicaid to pay for home health services but is willing to risk going home anyway.  CSW will continue to follow.  [name] Clinical Social Work Dept Weekend Social Worker [phone] 2:42 PM

## 2017-01-11 NOTE — Progress Notes (Signed)
Pt c/o a headache. BP in the 170's. Prn hydralazine given earlier this afternoon. PRN tylenol given. Md paged. Will cont to monitor pt.

## 2017-01-12 LAB — CBC
HEMATOCRIT: 25.5 % — AB (ref 36.0–46.0)
Hemoglobin: 7.9 g/dL — ABNORMAL LOW (ref 12.0–15.0)
MCH: 28.6 pg (ref 26.0–34.0)
MCHC: 31 g/dL (ref 30.0–36.0)
MCV: 92.4 fL (ref 78.0–100.0)
Platelets: 309 10*3/uL (ref 150–400)
RBC: 2.76 MIL/uL — AB (ref 3.87–5.11)
RDW: 16.1 % — AB (ref 11.5–15.5)
WBC: 4.6 10*3/uL (ref 4.0–10.5)

## 2017-01-12 LAB — BASIC METABOLIC PANEL
ANION GAP: 10 (ref 5–15)
BUN: 13 mg/dL (ref 6–20)
CO2: 30 mmol/L (ref 22–32)
Calcium: 8.9 mg/dL (ref 8.9–10.3)
Chloride: 100 mmol/L — ABNORMAL LOW (ref 101–111)
Creatinine, Ser: 3.37 mg/dL — ABNORMAL HIGH (ref 0.44–1.00)
GFR calc Af Amer: 17 mL/min — ABNORMAL LOW (ref >60)
GFR calc non Af Amer: 15 mL/min — ABNORMAL LOW (ref >60)
Glucose, Bld: 93 mg/dL (ref 65–99)
POTASSIUM: 3.9 mmol/L (ref 3.5–5.1)
Sodium: 140 mmol/L (ref 135–145)

## 2017-01-12 MED ORDER — HYDROCODONE-HOMATROPINE 5-1.5 MG/5ML PO SYRP: 5.0000 mL | ORAL_SOLUTION | ORAL | 0 refills | Status: DC | PRN

## 2017-01-12 MED ORDER — FLUCONAZOLE 200 MG PO TABS: 200.0000 mg | ORAL_TABLET | Freq: Every day | ORAL | 0 refills | Status: DC

## 2017-01-12 MED ORDER — OXYCODONE HCL 5 MG PO TABS: 5.0000 mg | ORAL_TABLET | Freq: Four times a day (QID) | ORAL | 0 refills | Status: DC | PRN

## 2017-01-12 MED ORDER — BUDESONIDE 0.5 MG/2ML IN SUSP: 0.5000 mg | Freq: Two times a day (BID) | RESPIRATORY_TRACT | 0 refills | Status: DC

## 2017-01-12 MED ORDER — VANCOMYCIN 50 MG/ML ORAL SOLUTION: 250.0000 mg | Freq: Four times a day (QID) | ORAL | 0 refills | Status: AC

## 2017-01-12 MED ORDER — QUETIAPINE FUMARATE 100 MG PO TABS: 100.0000 mg | ORAL_TABLET | Freq: Every day | ORAL | 0 refills | Status: DC

## 2017-01-12 MED ORDER — LINEZOLID 600 MG PO TABS: 600.0000 mg | ORAL_TABLET | Freq: Two times a day (BID) | ORAL | 0 refills | Status: DC

## 2017-01-12 MED ORDER — IPRATROPIUM-ALBUTEROL 0.5-2.5 (3) MG/3ML IN SOLN: 3.0000 mL | Freq: Four times a day (QID) | RESPIRATORY_TRACT | 0 refills | Status: DC | PRN

## 2017-01-12 MED ORDER — HYDRALAZINE HCL 25 MG PO TABS: 25.0000 mg | ORAL_TABLET | Freq: Three times a day (TID) | ORAL | 0 refills | Status: DC

## 2017-01-12 MED ORDER — PANTOPRAZOLE SODIUM 40 MG PO TBEC: 40.0000 mg | DELAYED_RELEASE_TABLET | Freq: Every day | ORAL | 0 refills | Status: DC

## 2017-01-12 NOTE — Progress Notes (Signed)
Patient being transported to [facility]. [facility] picked patient up , tried calling nursing home to give report, no answer.

## 2017-01-12 NOTE — Clinical Social Work Placement (Signed)
   CLINICAL SOCIAL WORK PLACEMENT  NOTE  Date:  01/12/2017  Patient Details  Name: [name] MRN: [id number] Date of Birth: [birth year]  Clinical Social Work is seeking post-discharge placement for this patient at the [place] level of care (*CSW will initial, date and re-position this form in  chart as items are completed):  Yes   Patient/family provided with [place] Work Department's list of facilities offering this level of care within the geographic area requested by the patient (or if unable, by the patient's family).  Yes   Patient/family informed of their freedom to choose among providers that offer the needed level of care, that participate in Medicare, Medicaid or managed care program needed by the patient, have an available bed and are willing to accept the patient.  Yes   Patient/family informed of [facility]'s ownership interest in [facility] and [facility], as well as of the fact that they are under no obligation to receive care at these facilities.  PASRR submitted to EDS on 12/26/16     PASRR number received on 01/10/17     Existing PASRR number confirmed on       FL2 transmitted to all facilities in geographic area requested by pt/family on 01/10/17     FL2 transmitted to all facilities within larger geographic area on 01/10/17     Patient informed that his/her managed care company has contracts with or will negotiate with certain facilities, including the following:        Yes   Patient/family informed of bed offers received.  Patient chooses bed at [facility]     Physician recommends and patient chooses bed at      Patient to be transferred to [facility] on 01/12/17.  Patient to be transferred to facility by Ambulance     Patient family notified on 01/12/17 of transfer.  Name of family member notified:  Patient has notified her daughter     PHYSICIAN Please prepare priority discharge summary, including  medications, Please prepare prescriptions, Please sign FL2     Additional Comment:    _______________________________________________ Rigoberto Noel, LCSW 01/12/2017, 3:37 PM

## 2017-01-12 NOTE — Progress Notes (Signed)
Physical Therapy Treatment Patient Details Name: [name] MRN: [id number] DOB: [birth year] Today's Date: 01/12/2017    History of Present Illness 51 y/o F, 92 pk year smoker, with Hx of COPD/asthma and bipolar disorder admitted 1/19 with SOB.  Working diagnosis of acute asthma exacerbation, multilobar PNA with resultant hypoxic respiratory failure.  Intubated 12/12/16 to 12/20/16.  Extubated 1/28. Pt transferred to step down on 1/29 with ileus. NG placed which pt pulled out.    PT Comments    Pt progressing with gait and able to ambulate in hall with need for 4L O2 to maintain sats>90%. No further need for chair follow with pt much more alert, oriented and progressing with all function. Pt encouraged to continue gait and HEP to maximize independence and function.  HR 98-110 with activity sats 91% on 2.5 L at rest with sats dropping to 88% on 2L and requiring 4L with gait   Follow Up Recommendations  Supervision for mobility/OOB;SNF     Equipment Recommendations  Rolling walker with 5" wheels    Recommendations for Other Services       Precautions / Restrictions Precautions Precautions: Fall Precaution Comments: check sats Restrictions Weight Bearing Restrictions: No    Mobility  Bed Mobility Overal bed mobility: Modified Independent                Transfers Overall transfer level: Needs assistance   Transfers: Sit to/from Stand;Stand Pivot Transfers Sit to Stand: Supervision         General transfer comment: cues for hand placement and safety  Ambulation/Gait Ambulation/Gait assistance: Min guard Ambulation Distance (Feet): 200 Feet Assistive device: Rolling walker (2 wheeled) Gait Pattern/deviations: Step-through pattern;Decreased stride length;Trunk flexed   Gait velocity interpretation: Below normal speed for age/gender General Gait Details: cues for posture and breathing technique   Stairs            Wheelchair Mobility    Modified  [place] (Stroke Patients Only)       Balance Overall balance assessment: Needs assistance   Sitting balance-Leahy Scale: Good       Standing balance-Leahy Scale: Fair                      Cognition Arousal/Alertness: Awake/alert Behavior During Therapy: WFL for tasks assessed/performed Overall Cognitive Status: Impaired/Different from baseline   Orientation Level: Situation;Disoriented to             General Comments: pt continues to have difficulty with reason for admission and unable to recall PLOF    Exercises General Exercises - Lower Extremity Long Arc Quad: AROM;20 reps;Both;Seated Hip Flexion/Marching: AROM;20 reps;Both;Seated    General Comments        Pertinent Vitals/Pain Pain Assessment: No/denies pain    Home Living                      Prior Function            PT Goals (current goals can now be found in the care plan section) Progress towards PT goals: Progressing toward goals    Frequency           PT Plan Current plan remains appropriate    Co-evaluation             End of Session Equipment Utilized During Treatment: Gait belt;Oxygen Activity Tolerance: Patient tolerated treatment well Patient left: in chair;with call bell/phone within reach;with chair alarm set     Time: [id number] PT Time  Calculation (min) (ACUTE ONLY): 23 min  Charges:  $Gait Training: 8-22 mins $Therapeutic Activity: 8-22 mins                    G Codes:      Suhaib Guzzo B Ashima Shrake 01/31/2017, 9:44 AM  Elwyn Reach, [place]

## 2017-01-12 NOTE — Discharge Instructions (Signed)
Acute Kidney Injury, Adult °Acute kidney injury (AKI) occurs when there is sudden (acute) damage to the kidneys. A small amount of kidney damage may not cause problems, but a large amount of damage may make it difficult or impossible for the kidneys to work the way they should. AKI may develop into long-lasting (chronic) kidney disease. Early detection and treatment of AKI may prevent kidney damage from becoming permanent or getting worse. °What are the causes? °Common causes of this condition include: °· A problem with blood flow to the kidneys. This may be caused by: °¨ Blood loss. °¨ Heart and blood vessel (cardiovascular) disease. °¨ Severe burns. °¨ Liver disease. °· Direct damage to the kidneys. This may be caused by: °¨ Some medicines. °¨ A kidney infection. °¨ Poisoning. °¨ Being around or in contact with poisonous (toxic) substances. °¨ A surgical wound. °¨ A hard, direct force to the kidney area. °· A sudden blockage of urine flow. This may be caused by: °¨ Cancer. °¨ Kidney stones. °¨ Enlarged prostate in males. °What are the signs or symptoms? °Symptoms develop slowly and may not be obvious until the kidney damage becomes severe. It is possible to have AKI for years without showing any symptoms. Symptoms of this condition can include: °· Swelling (edema) of the face, legs, ankles, or feet. °· Numbness, tingling, or loss of feeling (sensation) in the hands or feet. °· Tiredness (lethargy). °· Nausea or vomiting. °· Confusion or trouble concentrating. °· Problems with urination, such as: °¨ Painful or burning feeling during urination. °¨ Decreased urine production. °¨ Frequent urination, especially at night. °¨ Bloody urine. °· Muscle twitches and cramps, especially in the legs. °· Shortness of breath. °· Weakness. °· Constant itchiness. °· Loss of appetite. °· Metallic taste in the mouth. °· Trouble sleeping. °· Pale lining of the eyelids and surface of the eye (conjunctiva). °How is this diagnosed? °This  condition may be diagnosed with various tests. Tests may include: °· Blood tests. °· Urine tests. °· Imaging tests. °· A test in which a sample of tissue is removed from the kidneys to be looked at under a microscope (kidney biopsy). °How is this treated? °Treatment of AKI varies depending on the cause and severity of the kidney damage. In mild cases, treatment may not be needed. The kidneys may heal on their own. °If AKI is more severe, your health care provider will treat the cause of the kidney damage, help the kidneys heal, and prevent problems from occurring. Severe cases may require a procedure to remove toxic wastes from the body (dialysis) or surgery to repair kidney damage. Surgery may involve: °· Repair of a torn kidney. °· Removal of a urine flow obstruction. °Follow these instructions at home: °· Follow your prescribed diet. °· Take over-the-counter and prescription medicines only as told by your health care provider. °¨ Do not take any new medicines unless approved by your health care provider. Many medicines can worsen your kidney damage. °¨ Do not take any vitamin and mineral supplements unless approved by your health care provider. Many nutritional supplements can worsen your kidney damage. °¨ The dose of some medicines that you take may need to be adjusted. °· Do not use any tobacco products, such as cigarettes, chewing tobacco, and e-cigarettes. If you need help quitting, ask your health care provider. °· Keep all follow-up visits as told by your health care provider. This is important. °· Keep track of your blood pressure. Report changes in your blood pressure as told by your   health care provider. °· Achieve and maintain a healthy weight. If you need help with this, ask your health care provider. °· Start or continue an exercise plan. Try to exercise at least 30 minutes a day, 5 days a week. °· Stay current with immunizations as told by your health care provider. °Where to find more  information: °· American Association of Kidney Patients: www.aakp.org °· National Kidney Foundation: www.kidney.org °· American Kidney Fund: www.akfinc.org °· Life Options Rehabilitation Program: www.lifeoptions.org and www.kidneyschool.org °Contact a health care provider if: °· Your symptoms get worse. °· You develop new symptoms. °Get help right away if: °· You develop symptoms of end-stage kidney disease, which include: °¨ Headaches. °¨ Abnormally dark or light skin. °¨ Numbness in the hands or feet. °¨ Easy bruising. °¨ Frequent hiccups. °¨ Chest pain. °¨ Shortness of breath. °¨ End of menstruation in women. °· You have a fever. °· You have decreased urine production. °· You have pain or bleeding when you urinate. °This information is not intended to replace advice given to you by your health care provider. Make sure you discuss any questions you have with your health care provider. °Document Released: 05/26/2011 Document Revised: 06/19/2016 Document Reviewed: 07/09/2012 °Elsevier Interactive Patient Education © 2017 [employer]vier Inc. ° ° °

## 2017-01-12 NOTE — Care Management Note (Signed)
Case Management Note  Patient Details  Name: [name] MRN: [id number] Date of Birth: 05/29/1966  Subjective/Objective:  Pt presented as a transfer to [facility] presented for Acute Respiratory Failure. Pt is now agreeable to SNF. CSW is assisting with disposition needs.                    Action/Plan: Pt has a bed offer at [facility] following. No needs from CM at this time. Will follow until d/c.   Expected Discharge Date:  01/12/17               Expected Discharge Plan:  Skilled Nursing Facility  In-House Referral:  Clinical Social Work  Discharge planning Services     Post Acute Care Choice:  NA Choice offered to:  NA  DME Arranged:  N/A DME Agency:     HH Arranged:  NA HH Agency:     Status of Service:  Completed, signed off  If discussed at [employer] of Stay Meetings, dates discussed:    Additional Comments:  Bethena Roys, RN 01/12/2017, 12:25 PM

## 2017-01-12 NOTE — Discharge Summary (Addendum)
Physician Discharge Summary  [name] AOZ:[id number] DOB: 04-18-1966 DOA: 12/12/2016  PCP: [name], MD  Admit date: 12/12/2016 Discharge date: 01/12/2017  Recommendations for Outpatient Follow-up:  1. Pt will need to follow up with PCP in 1-2 weeks post discharge 2. Please obtain BMP to evaluate electrolytes and kidney function 3. Please also check CBC to evaluate Hg and Hct levels 4. Continue Linezolid until 01/15/2017 5. Continue oral vancomycin 01/22/2017 6. Needs to see Dr. Nils Pyle on follow up  Discharge Diagnoses:  Principal Problem:   Acute respiratory failure ([place]) Active Problems:   Chronic low back pain   COPD with acute exacerbation (HCC)   IBS (irritable bowel syndrome)   Chronic narcotic dependence (HCC)   Cigarette nicotine dependence without complication   MDD (major depressive disorder), recurrent severe, without psychosis ([place])  Discharge Condition: Stable  Diet recommendation: Heart healthy diet discussed in details   Brief Narrative:   Chief complaint: Follow-up pneumonia/empyema/C. difficile colitis  51 y.o. female with a PMH of 45 pack year history of tobacco abuse, asthma, allergic rhinitis, COPD, hypertension and bipolar disorder who was admitted by the critical care team 12/12/16 with acute hypoxic respiratory failure, asthma exacerbation and multilobar pneumonia. She was also encephalopathic on admission. Her hospital course has been complicated by the development of C. difficile colitis, Streptococcus pneumonia lung abscess/empyema status post VATS on 12/26/16, ileus, pneumothorax, VDRF requiring intubation (intubated 12/12/16-12/21/16, reintubated 12/26/16-12/29/16), and demand ischemia. Patient transferred to the care of [facility] on 01/03/17.  Culture data is as follows: 1/19 legionella: negative 1/19 blood cx: neg 1/20 mrsa pcr: neg 1/20 rvp: neg 1/20 resp cx: abundant strep pneumo 1/20 strep pneumo: positive 1/22 HSV swab: HSV1  positive 1/24 BCx: neg 1/28 c diff + 2/2 bronch washing- few candida albicans, dubliniensis 2/2 LLL lung, fungal cx: candida albicans 2/2 LLL wound cx, deep: few candida albicans, rare MRSE  Assessment/Plan:   Principal Problem:   Acute respiratory failure with hypoxia ([name]), Multifactorial - Required intubation 12/12/16-12/21/16 with reintubation 12/26/16 through 12/29/16. Continue to wean oxygen.  - Continue aggressive pulmonary toilet. Continue duo nebs every 6 hours and Pulmicort twice a day.  - Respiratory status remains stable.     Sepsis secondary to Pneumonia with cavity of lung/Abscess of lower lobe of left lung with pneumonia (HCC)/Empyema of pleural space (HCC)/HCAP Pneumonia of left upper lobe due to Streptococcus pneumoniae /pneumothorax/ with associated acute kidney injury and shock liver, sepsis ruled in on admission, now resolved  - Initial respiratory cultures grew strep pneumonia with a positive strep pneumonia antigen.  - S/P VATS 12/26/16. OR cultures + staph epidermidis. BAL cultures + for Candida.  - Continued vancomycin for MRSE, and Diflucan for Candida. Dr. Megan Salon of ID following for rec's regarding duration of therapy, ABX changed to Linezolid, please see duration of Antibiotics outlined below  - chest tubes have all been removed, CXR is free from pneumothora - CTS will arrange 2 week follow up for patient, chest tube sutures can be removed today   Active Problems:    AKI - Cr continues trending up from 2.39 --> 3.31 --> 3.66 --> 3.8 --> 3.7 --> 3.3 this AM  - bit concerned that Vancomycin could be offending agent here but this is not clear at this point  - IV vancomycin has been stopped and ABX changed to Linezolid - renal US with no signs of hydronephrosis, UNa and UCr requested    Nausea and vomiting - resolved - tolerating diet  well     Acute encephalopathy metabolic and also secondary to sepsis - improved and pt alert this am - continue Seroquel      Dysphagia/ileus - Initially managed with TPN given ileus - diet advanced and pt tolerating well     Incision wound not present on admission  - incision wound, at the site of vent placement  - full thickness tissue loss, base of the ulcer covered with slough     Hypertension, essential  - Continue scheduled Lopressor and hydralazine     Transaminitis - resolved     Enteritis due to Clostridium difficile/abdominal distention - Initially treated with vancomycin enemas and IV Flagyl. Continue oral vancomycin.  - Having formed stools per nursing.    Physical deconditioning - SNF recommended     Normocytic anemia - no sings of active bleeding but noted Hg drop overnight, repeat Hg check to make sure no blood work error     Hypomagnesemia - supplemented  Family Communication/Anticipated D/C date and plan/Code Status   Code Status: Full Code.  Family Communication: No family present at the bedside.  Disposition Plan: SNF   Medical Consultants:    CVTS  PCCM  Neurology  ID  Procedures:    VATS2/2/18  Anti-Infectives:   2/6 vanc>> changed to Linezolid  2/3 eraxis>>2/6 2/2 zosyn>>2/7 Fluconazole PO 2/7>> 2/20 Vanc PO 2/8>> 3/1 1/19 ceftriaxone > 1/24 1/19 azithromycin > 1/22 1/22 acyclovir > 1/29 1/24 unasyn >>2/2  Procedures/Studies: Dg Chest 2 View  Result Date: 01/08/2017 CLINICAL DATA:  50 year old female status post left VATS drainage and debridement of left lower lobe lung abscess 12/26/2016. Initial encounter. EXAM: CHEST  2 VIEW COMPARISON:  01/07/2017 and earlier FINDINGS: Seated AP and lateral views of the chest. Stable left PICC line. Stable lung volumes, within normal limits. Stable cardiac size and mediastinal contours. No pneumothorax or pulmonary edema. No definite pleural effusion. Residual mixed patchy and cavitary opacity in the left lung is stable. Superimposed mild bibasilar atelectasis. Multiple right lateral rib fractures. No  acute osseous abnormality identified. Stable cholecystectomy clips. Negative visible bowel gas pattern. IMPRESSION: 1. Stable residual mixed patchy and cavitary opacity in the left lung. Mild bibasilar atelectasis. No pneumothorax identified and no definite pleural effusion. 2. No new cardiopulmonary abnormality. Electronically Signed   By: Genevie Ann M.D.   On: 01/08/2017 08:29   Dg Chest 2 View  Result Date: 01/06/2017 CLINICAL DATA:  Status post left-sided decortication on February 2nd, 2018 EXAM: CHEST  2 VIEW COMPARISON:  Portable chest x-ray of January 05, 2017 FINDINGS: The lungs are slightly less well inflated today. There is a persistent small left pleural effusion versus pleural thickening laterally. The left-sided chest tube is in stable position. There is no pneumothorax. The interstitial markings are both lungs are coarse. The heart is top-normal in size. The central pulmonary vascularity is prominent. The left-sided PICC line tip projects over the midportion of the SVC. IMPRESSION: No pneumothorax or significant re-accumulation of left pleural fluid. Stable appearance of the left chest tube and left PICC line. Stable bilateral pulmonary interstitial prominence. Electronically Signed   By: David  Martinique M.D.   On: 01/06/2017 08:11   Ct Chest Wo Contrast  Result Date: 12/25/2016 CLINICAL DATA:  LEFT lower lobe abscess.  Bacteria pneumonia. EXAM: CT CHEST WITHOUT CONTRAST TECHNIQUE: Multidetector CT imaging of the chest was performed following the standard protocol without IV contrast. COMPARISON:  Chest CT 12/22/2016 FINDINGS: Cardiovascular: No significant vascular findings. Normal heart size. No  pericardial effusion. Mediastinum/Nodes: No axillary supraclavicular adenopathy no mediastinal hilar adenopathy. No pericardial effusion esophagus normal. Lungs/Pleura: Moderate-sized LEFT pneumothorax with gas collecting anteriorly in this supine patient (approximately 20% hemothorax volume). This fluid  level at the inferior LEFT hemithorax consistent with a component of hydropneumothorax (image 110, series 4). Cavitary lesion / pulmonary abscess in the lingula is similar in size measuring 6.2 by 3.8 cm compared to 6.3 x 3.8 cm (image 67, series 4) There is new loculated fluid at the RIGHT lung base measuring 6.3 cm in depth with a small air-fluid level. Loculated fluid along the LEFT cardiac border. There is new atelectasis of the LEFT lung base. RIGHT lung is relatively clear.  There is centrilobular emphysema Upper Abdomen: Adrenal glands normal. Musculoskeletal: Multiple bilateral healed rib fractures laterally on the LEFT and RIGHT. IMPRESSION: 1. Moderate volume LEFT hydropneumothorax slightly increased in volume from radiograph 12/24/2016. 2. No change in size of pulmonary abscess in the lingula. 3. Interval increase in loculated fluid at the LEFT lung base with scattered air pockets and lower lobe atelectasis. These results will be called to the ordering clinician or representative by the Radiologist Assistant, and communication documented in the PACS or zVision Dashboard. Electronically Signed   By: Suzy Bouchard M.D.   On: 12/25/2016 09:06   Ct Chest Wo Contrast  Result Date: 12/22/2016 CLINICAL DATA:  Evaluate pneumothorax seen on CT of the abdomen and pelvis. Initial encounter. EXAM: CT CHEST WITHOUT CONTRAST TECHNIQUE: Multidetector CT imaging of the chest was performed following the standard protocol without IV contrast. COMPARISON:  CT of the abdomen and pelvis performed earlier today at 3:40 p.m., and CT of the chest performed 12/16/2016 FINDINGS: Cardiovascular: The heart is normal in size. Minimal calcification is seen along the aortic arch. The great vessels are grossly unremarkable in appearance. Mediastinum/Nodes: The mediastinum is unremarkable in appearance. No mediastinal lymphadenopathy is seen. No pericardial effusion is identified. The visualized portions of the thyroid gland are  unremarkable. No axillary lymphadenopathy is seen. An apparent enteric tube is noted ending at the proximal esophagus. Lungs/Pleura: There has been interval improvement in patchy airspace opacity within both lungs, likely reflecting improving infectious process. Mild residual hazy airspace opacities are seen particularly at the right middle lobe and left lingula. A large cavitary lesion is again noted at the periphery of the left upper lobe, relatively stable in size, measuring approximately 6.4 x 3.8 cm. The degree of wall thickening has decreased from the prior study, while the amount of fluid within the cavitary lesion has increased. This suggests strongly against malignancy, and is suspicious for a lung abscess. The previously noted small 1 cm minimally cavitary nodule at the left lower lobe has resolved. Left basilar atelectasis is noted. No definite pleural effusion is seen. A tiny left anterior pneumothorax is noted, localized to the anterior left lung base adjacent to the mediastinum, as seen on CT of the abdomen and pelvis, but new from the prior CT of the chest. Upper Abdomen: The visualized portions of the liver and spleen are grossly unremarkable. The patient is status post cholecystectomy, with clips noted along the gallbladder fossa. Mild soft tissue inflammation and fluid tracks inferiorly along Gerota's fascia bilaterally. The visualized portions of the pancreas, adrenal glands and kidneys are within normal limits. Musculoskeletal: No acute osseous abnormalities are identified. Chronic partially healed bilateral rib fractures are noted. The visualized musculature is unremarkable in appearance. IMPRESSION: 1. Tiny left anterior pneumothorax, localized to the anterior left lung base adjacent  to the mediastinum, as seen on CT of the abdomen and pelvis. 2. Large cavitary lesion again noted at the periphery of the left upper lung lobe, relatively stable in size, measuring 6.4 x 3.8 cm. The degree of wall  thickening has decreased from the prior study, while the amount of fluid within the cavitary lesion has increased. This suggests strongly against malignancy, and is suspicious for a lung abscess. 3. Previously noted small 1 cm cavitary nodule at the left lower lobe has resolved. Left basilar atelectasis noted. 4. Interval improvement in patchy airspace opacity within both lungs, likely reflecting improving infectious process. Mild residual hazy opacities at the right middle lobe and left lingula. 5. Apparent enteric tube noted ending at the proximal esophagus. Would correlate clinically. 6. Mild soft tissue inflammation and fluid tracks inferiorly along Gerota's fascia bilaterally. 7. Chronic partially healed bilateral rib fractures noted. Electronically Signed   By: Garald Balding M.D.   On: 12/22/2016 17:51   Ct Chest Wo Contrast  Result Date: 12/16/2016 CLINICAL DATA:  Evaluate cavitary structure in left lung. EXAM: CT CHEST WITHOUT CONTRAST TECHNIQUE: Multidetector CT imaging of the chest was performed following the standard protocol without IV contrast. COMPARISON:  Chest radiograph 12/16/2016.  Chest CT 12/12/2015 FINDINGS: Cardiovascular: Atherosclerotic calcifications the aortic arch. Normal caliber of the thoracic aorta. Mediastinum/Nodes: Nasogastric tube in the esophagus. Mild enlargement of prevascular lymph nodes, largest measuring 0.9 cm in the short axis on sequence 3, image 45. Otherwise, there is no significant chest lymphadenopathy. Limited evaluation for hilar lymphadenopathy on this noncontrast examination. Lungs/Pleura: Endotracheal tube in the trachea. There are underlying emphysematous changes with patchy parenchymal densities in the upper lungs. There are patchy parenchymal densities in the right middle lobe. Small amount of pleural fluid in the posterior left lung base with focal atelectasis or consolidation the posterior left lower lobe. There is a peripheral cavitary lesion in the left  upper lobe/ lingular region. This cavitary lesion is new from the previous CT and measures 5.9 x 4.1 x 4.3 cm. A small amount of fluid within this cavitary lesion. There is concern for a small nodular lesion in left lower lobe with minimal cavitation. This is seen on sequence 7, image 96 and measures roughly 1.0 cm. Upper Abdomen: Nasogastric tube extends in the stomach. Gallbladder has been surgically removed. Normal appearance of the adrenal glands. Musculoskeletal: Old bilateral rib fractures. IMPRESSION: New cavitary lesion in the left upper lobe, measuring up to 5.9 cm. There is a small amount of fluid in this structure. Findings are suggestive for a lung abscess but a neoplastic process cannot be completely excluded. There is also a 1 cm nodular structure in the left lower lobe with minimal cavitation. This 1 cm nodule could also represent an infectious or inflammatory process. Emphysema with scattered areas of parenchymal disease and interstitial thickening throughout the upper lobes and in the right middle lobe. Findings could be related to edema and/or infection. Small amount of pleural fluid with volume loss or consolidation along the posterior left lower lobe. Slightly enlarged prevascular lymph nodes. These could be reactive in etiology. Electronically Signed   By: Markus Daft M.D.   On: 12/16/2016 19:38   Mr Brain Wo Contrast  Result Date: 12/14/2016 CLINICAL DATA:  Acute hypoxic respiratory failure. Acute encephalopathy. EXAM: MRI HEAD WITHOUT CONTRAST TECHNIQUE: Multiplanar, multiecho pulse sequences of the brain and surrounding structures were obtained without intravenous contrast. COMPARISON:  Head CT 12/12/2016. FINDINGS: Brain: Diffusion imaging shows a cluster of small  acute infarctions in the left cerebellum. No brainstem abnormality. Cerebral hemispheres show mild chronic small-vessel ischemic change of the deep white matter. There is an old left inferior frontal infarction which has  progressed to encephalomalacia. This could be ischemic or post traumatic. No mass lesion, hemorrhage, hydrocephalus or extra-axial collection. Vascular: Major vessels at the base of the brain show flow. 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 No mass effect or hemorrhage. 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 Mild chronic small-vessel ischemic change of the hemispheric white matter. Left occipital lobe questioned by CT is negative by MRI. Electronically Signed   By: Nelson Chimes M.D.   On: 12/14/2016 12:18   Ct Abdomen Pelvis W Contrast  Result Date: 12/22/2016 CLINICAL DATA:  Abdominal distension.  Evaluate ileus. EXAM: CT ABDOMEN AND PELVIS WITH CONTRAST TECHNIQUE: Multidetector CT imaging of the abdomen and pelvis was performed using the standard protocol following bolus administration of intravenous contrast. CONTRAST:  191m ISOVUE-300 IOPAMIDOL (ISOVUE-300) INJECTION 61% COMPARISON:  Abdominal x-ray dated 12/20/2016. CT chest/abdomen dated 12/16/2016 FINDINGS: Lower chest: Small pneumothorax at the left lung base anteriorly. Incompletely imaged cavitary mass/consolidation within the inferior aspects of the left upper lobe and/or lingula, as previously described on earlier chest CT. Hepatobiliary: Status post cholecystectomy. Liver appears normal. No bile duct dilatation. Pancreas: Unremarkable. Spleen: Normal in size without focal abnormality. Adrenals/Urinary Tract: Adrenal glands appear normal. Kidneys appear normal without mass, stone or hydronephrosis. Bladder is decompressed by Foley catheter. Stomach/Bowel: There is moderate distention of the transverse colon and right colon containing gas and fluid/stool, measuring 6.6 cm diameter and 7.5 cm diameter respectively. Rectosigmoid colon and descending colon are normal in caliber. Small bowel is normal in  caliber. Enteric tube well positioned within the decompressed stomach. Vascular/Lymphatic: Scattered atherosclerotic changes of the normal caliber abdominal aorta. No enlarged lymph nodes seen in the abdomen or pelvis. Reproductive: Unremarkable. Other: Small amount of free fluid in the lower pelvis. Trace free fluid/edema within the upper paracolic gutter regions bilaterally. No abscess collections seen. No free intraperitoneal air. Musculoskeletal: Degenerative changes throughout the slightly scoliotic thoracolumbar spine, mild in degree. No acute or suspicious osseous finding. Multiple old rib fractures bilaterally. IMPRESSION: 1. Moderate distension of the transverse colon and right colon, containing gas and fluid/stool, measuring 6.6 cm diameter and 7.5 cm diameter respectively, most compatible with the given history of ileus as there is no transition point to suggest mechanical large bowel obstruction. 2. Pneumothorax at the left lung base anteriorly, incompletely imaged, perhaps related to a interval surgical/diagnostic intervention for the previously described cavitary mass/consolidation within the left lung. If no recent surgical/diagnostic intervention to explain this incompletely imaged pneumothorax at the left lung base, recommend chest CT for complete characterization. 3. Small amount of free fluid in the abdomen and pelvis. No abscess collection seen. No free intraperitoneal air. 4. Aortic atherosclerosis. These results were called by telephone at the time of interpretation on 12/22/2016 at 4:14 pm to Dr. GBonnielee Haff, who verbally acknowledged these results. Electronically Signed   By: SFranki CabotM.D.   On: 12/22/2016 16:15   [place]al  Result Date: 01/07/2017 CLINICAL DATA:  Acute renal failure EXAM: RENAL / URINARY TRACT ULTRASOUND COMPLETE COMPARISON:  None. FINDINGS: Right Kidney: Length: 13.5 cm. Echogenicity within normal limits. No mass or hydronephrosis visualized. Left Kidney: Length:  11.2 cm. Echogenicity within normal limits. No mass or hydronephrosis visualized.  Assessment of the left kidney was slightly limited due to overlying bandaging. Bladder: Appears normal for degree of bladder distention. IMPRESSION: No obstructive uropathy or renal mass. No nephrolithiasis. Cortical-medullary distinction is maintained within both kidneys. Electronically Signed   By: Ashley Royalty M.D.   On: 01/07/2017 19:40   Dg Chest Port 1 View  Result Date: 01/07/2017 CLINICAL DATA:  Shortness of breath EXAM: PORTABLE CHEST 1 VIEW COMPARISON:  Yesterday FINDINGS: Left-sided chest tube is been removed. No apical pneumothorax. Lateral left pleural thickening is stable. Stable cavitary focus in the lateral left chest. Normal heart size. Negative mediastinal contours. Left upper extremity PICC with tip at the SVC level. IMPRESSION: 1. No interval pneumothorax after chest tube removal. 2. Known cavity in the left mid lung. Electronically Signed   By: Monte Fantasia M.D.   On: 01/07/2017 07:59   Dg Chest Port 1 View  Result Date: 01/05/2017 CLINICAL DATA:  Chest pain EXAM: PORTABLE CHEST 1 VIEW COMPARISON:  January 04, 2017 FINDINGS: Central catheter tip is in the superior vena cava. There is a chest tube on the left. There is no evident pneumothorax. There is atelectatic change in the left mid lung and lateral right base regions. No new opacity evident. Heart size and pulmonary vascularity are normal. No adenopathy. There is degenerative change in the right shoulder. IMPRESSION: Tube and catheter positions as described without pneumothorax. Areas of atelectatic change bilaterally. No new opacity. Stable cardiac silhouette. Electronically Signed   By: Lowella Grip III M.D.   On: 01/05/2017 07:10   Dg Chest Port 1 View  Result Date: 01/04/2017 CLINICAL DATA:  Empyema EXAM: PORTABLE CHEST 1 VIEW COMPARISON:  01/03/2017 FINDINGS: Support devices including left chest tube are unchanged. No visible  pneumothorax. No confluent opacities. Heart is borderline in size. IMPRESSION: No change.  No pneumothorax. Electronically Signed   By: Rolm Baptise M.D.   On: 01/04/2017 08:45   Dg Chest Port 1 View  Result Date: 01/03/2017 CLINICAL DATA:  Continued surveillance pneumothorax. EXAM: PORTABLE CHEST 1 VIEW COMPARISON:  01/02/2017. FINDINGS: LEFT chest tube unchanged. Streaky LEFT lung densities related to fluid and/or subsegmental atelectasis. No definite LEFT apical pneumothorax is seen on today's study. PICC line tip mid SVC from LEFT arm approach. The small RIGHT effusion stable. Stable cardiomediastinal silhouette. IMPRESSION: No definite LEFT apical pneumothorax on today's exam. Support tubes and apparatus unchanged. Electronically Signed   By: Staci Righter M.D.   On: 01/03/2017 07:31   Dg Chest Port 1 View  Result Date: 01/02/2017 CLINICAL DATA:  Empyema, asthma, hypertension, COPD EXAM: PORTABLE CHEST 1 VIEW COMPARISON:  Portable exam 0545 hours compared 01/01/2017 FINDINGS: LEFT arm PICC line tip projects over SVC. Pair of LEFT thoracostomy tubes present. EKG leads project over chest. Normal heart size, mediastinal contours, and pulmonary vascularity. Persistent small LEFT pleural effusion and basilar atelectasis. Increased atelectasis and blunting of the RIGHT lateral costophrenic angle. Tiny persistent LEFT apical pneumothorax. Upper lungs clear. Healing told lateral lower RIGHT rib fractures. IMPRESSION: Tiny persistent LEFT apical pneumothorax. Persistent mild LEFT pleural effusion and bibasilar atelectasis. Electronically Signed   By: Lavonia Dana M.D.   On: 01/02/2017 08:45   Dg Chest Port 1 View  Result Date: 01/01/2017 CLINICAL DATA:  Empyema, left chest tube EXAM: PORTABLE CHEST 1 VIEW COMPARISON:  12/31/2016 FINDINGS: Support devices remain in stable position. Left chest tube in place. Small left apical pneumothorax, stable. Mid lung atelectasis on the left. Right lung is clear. Heart is  borderline in size. IMPRESSION: Small residual left apical pneumothorax, stable since prior study. Electronically Signed   By: Rolm Baptise M.D.   On: 01/01/2017 09:07   Dg Chest Port 1 View  Result Date: 12/31/2016 CLINICAL DATA:  Chest tube in place EXAM: PORTABLE CHEST 1 VIEW COMPARISON:  12/30/2016 FINDINGS: Cardiac shadow is stable. Two left thoracostomy catheters are again noted. No pneumothorax is seen. Left-sided PICC line is again noted. Mild atelectatic changes related to the more inferior chest tube are seen. No other focal abnormality is noted. IMPRESSION: Mild left midlung atelectatic changes.  No pneumothorax is noted. Electronically Signed   By: Inez Catalina M.D.   On: 12/31/2016 08:33   Dg Chest Port 1 View  Result Date: 12/30/2016 CLINICAL DATA:  Patient status post the VATs for empyema 12/26/2016. Chest tube in place. EXAM: PORTABLE CHEST 1 VIEW COMPARISON:  Single-view of the chest 12/29/2016 and 12/28/2016. FINDINGS: Endotracheal tube and NG tube have been removed. Support tubes and lines including a left chest tube are otherwise unchanged. There is no pneumothorax. Small amount of pleural fluid along the lateral left chest wall versus pleural thickening are unchanged. Aeration of the left lower lung zone appears mildly improved. Mild right basilar atelectasis is seen. Bilateral rib fractures are noted. IMPRESSION: Improved aeration in the left lower lung zone. Negative for pneumothorax with a chest tube in place. Electronically Signed   By: Inge Rise M.D.   On: 12/30/2016 07:39   Dg Chest Port 1 View  Result Date: 12/29/2016 CLINICAL DATA:  Intubated patient, left-sided chest tubes. Status post thoracoscopy, decortication, and bronchial lavage 3 days ago EXAM: PORTABLE CHEST 1 VIEW COMPARISON:  Portable chest x-ray of December 28, 2016. FINDINGS: The lungs are well-expanded. The interstitial markings have improved. There is no focal infiltrate. There is no pneumothorax nor large  pleural effusion. The 2 left-sided chest tubes are in stable position. The heart is top-normal in size. The pulmonary vascularity is not engorged. The endotracheal tube tip lies approximately 1.9 cm above the carina. The esophagogastric tube tip projects below the inferior margin of the image. The right internal jugular venous catheter tip projects over the midportion of the SVC. IMPRESSION: Improved appearance of the pulmonary interstitium consistent with improved aeration and decreased interstitial edema. No pneumothorax or significant pleural effusion. No focal pneumonia. The support tubes are in reasonable position. Electronically Signed   By: David  Martinique M.D.   On: 12/29/2016 07:27   Dg Chest Port 1 View  Result Date: 12/28/2016 CLINICAL DATA:  51 year old female status post left VATS drainage and debridement of left lower lobe lung abscess postoperative day 2. Initial encounter. EXAM: PORTABLE CHEST 1 VIEW COMPARISON:  12/27/2016 and earlier. FINDINGS: Portable AP semi upright view at at 0611 hours. Endotracheal tube tip between the level the clavicles and carina. Enteric tube courses to the abdomen, tip not included. Stable right IJ central line. Two left chest tubes remain visible, the 3rd May have been removed. No pneumothorax. Residual patchy peripheral and lung base opacity on the left. Superimposed multilevel left rib fractures. Stable right lung ventilation. Stable cardiac size and mediastinal contours. IMPRESSION: 1. Two left chest tubes remain in place.  No pneumothorax. 2. Stable endotracheal, enteric tubes and right IJ central line. 3. Stable residual left lung base opacity. No new cardiopulmonary abnormality. Electronically Signed   By: Genevie Ann M.D.   On: 12/28/2016 07:39   Dg Chest Port 1 View  Result Date: 12/27/2016 CLINICAL DATA:  51 year old female status post VATS and decortication for left empyema. Initial encounter. EXAM: PORTABLE CHEST 1 VIEW COMPARISON:  12/26/2016 and earlier.  FINDINGS: Portable AP semi upright view at 0559 hours. Endotracheal tube tip is at the clavicles. Enteric tube has been placed and courses to the abdomen, tip not included. Stable right IJ central line. Three left chest tubes remain in place. No pneumothorax identified. Mild regression of patchy residual left lung base opacity. Stable cardiac size and mediastinal contours. Resolved pulmonary vascular congestion. The right lung is clear allowing for portable technique. IMPRESSION: 1. Enteric tube placed and courses to the abdomen, tip not included. 2.  Otherwise stable lines and tubes, including 3 left chest tubes. 3. No pneumothorax. Resolved pulmonary vascular congestion. Mildly regressed residual patchy opacity at the left lung base. Electronically Signed   By: Genevie Ann M.D.   On: 12/27/2016 07:38   Dg Chest Port 1 View  Result Date: 12/26/2016 CLINICAL DATA:  Empyema of pleural space EXAM: PORTABLE CHEST 1 VIEW COMPARISON:  12/25/2016 radiographic and CT exams FINDINGS: Tip of endotracheal tube projects 3.4 cm above carina. RIGHT jugular central venous catheter with tip projecting over SVC. LEFT thoracostomy tubes present. Upper normal size of cardiac silhouette. Mediastinal contours are vascularity normal. Decrease in LEFT pleural effusion since prior CT exam. No LEFT pneumothorax definitely visualized. Scattered mild infiltrates within RIGHT lung unchanged from prior CT. Bones unremarkable. IMPRESSION: Decrease in LEFT pleural effusion and resolution of LEFT pneumothorax post thoracostomy tube placement. Scattered pulmonary infiltrates. Electronically Signed   By: Lavonia Dana M.D.   On: 12/26/2016 17:24   Dg Chest Port 1 View  Result Date: 12/25/2016 CLINICAL DATA:  Hypoxia and shortness of breath. EXAM: PORTABLE CHEST 1 VIEW COMPARISON:  Radiograph yesterday.  CT 12/22/2016 FINDINGS: Cavitary lesion in the left lung is again seen. Unchanged left lung base opacity may be combination of pleural fluid and  airspace disease. Patient is rotated. Left pneumothorax on prior exam is not confidently identified. Minimal right lung base atelectasis. IMPRESSION: 1. Unchanged left basilar opacity likely combination of pleural fluid and airspace disease, atelectasis or pneumonia. Cavitary lesion in the periphery of the left lung is again seen. 2. Left pneumothorax on prior exam is not confidently identified. 3. Right basilar atelectasis. Electronically Signed   By: Jeb Levering M.D.   On: 12/25/2016 04:50   Dg Chest Port 1 View  Result Date: 12/24/2016 CLINICAL DATA:  Shortness of Breath, change in behavior EXAM: PORTABLE CHEST 1 VIEW COMPARISON:  12/23/2016 FINDINGS: Cardiomediastinal silhouette is unremarkable. Stable cavitary lesion in left midlung peripheral measures 4.2 cm. Small partially loculated left pleural effusion left basilar atelectasis or infiltrate. There is small left apical pneumothorax about 4-5%. No pulmonary edema. IMPRESSION: Stable cavitary lesion left midlung peripheral. Small left pleural effusion left basilar atelectasis or infiltrate. Small left apical pneumothorax. Electronically Signed   By: Lahoma Crocker M.D.   On: 12/24/2016 12:35   Dg Chest Port 1 View  Result Date: 12/23/2016 CLINICAL DATA:  Shortness of breath. EXAM: PORTABLE CHEST 1 VIEW COMPARISON:  Radiograph of December 22, 2016. FINDINGS: Stable cardiomediastinal silhouette. No pneumothorax is noted. Mild central pulmonary vascular congestion is again noted. Stable mild loculated left pleural effusion is noted. Old right rib fractures are noted. IMPRESSION: Stable cardiomediastinal silhouette with mild central pulmonary vascular congestion. Stable mild loculated left pleural effusion. Electronically Signed   By: Marijo Conception, M.D.   On: 12/23/2016 14:09   Dg Chest [facility]  Result Date: 12/22/2016 CLINICAL DATA:  Acute hypoxic respiratory failure. Streptococcal pneumonia. EXAM: PORTABLE CHEST 1 VIEW COMPARISON:   12/20/2016, 12/21/2016, 12/22/2016 FINDINGS: Cavitary lesion in the left lung appears to contain more fluid today, as observed on accompanying CT. No other significant interval change. The small left pneumothorax might be marginally visible in the apex. No significant pneumothorax. Right lung is clear. Hilar and mediastinal contours are unremarkable and unchanged. IMPRESSION: Air-fluid level within the left cavitary lesion. No significant pneumothorax. Trace pneumothorax may be visible in the left apex, also seen on CT. Electronically Signed   By: Andreas Newport M.D.   On: 12/22/2016 22:02   Dg Chest Port 1 View  Result Date: 12/21/2016 CLINICAL DATA:  Acute respiratory failure with hypoxia EXAM: PORTABLE CHEST 1 VIEW COMPARISON:  12/20/2016 FINDINGS: Cavitary lesion in the left mid lung again noted, stable. Right lung is clear. Heart is normal size. NG tube is in the stomach. Interval extubation. IMPRESSION: Cavitary area in the left mid lung, unchanged. Interval extubation. Electronically Signed   By: Rolm Baptise M.D.   On: 12/21/2016 07:28   Dg Chest Port 1 View  Result Date: 12/20/2016 CLINICAL DATA:  Ventilator EXAM: PORTABLE CHEST 1 VIEW COMPARISON:  12/19/2016 FINDINGS: Endotracheal tube and NG tube are unchanged. Cavitary lesion in the left mid lung again noted, stable. Heart is normal size. Right lung is clear. No visible effusions or acute bony abnormality. IMPRESSION: Stable cavitary lesion in the left mid lung. Endotracheal tube stable. Electronically Signed   By: Rolm Baptise M.D.   On: 12/20/2016 07:14   Dg Chest Port 1 View  Result Date: 12/19/2016 CLINICAL DATA:  Check endotracheal tube placement EXAM: PORTABLE CHEST 1 VIEW COMPARISON:  12/18/2016 FINDINGS: Endotracheal tube and nasogastric catheter are again seen and stable. Cardiac shadow is within normal limits. The lungs are well aerated bilaterally. Previously seen effusion and left basilar infiltrate has resolved in the  interval. Persistent left cavitary lesion is noted. No new focal abnormality is seen. IMPRESSION: Improved aeration in the left base. Cavitary lesion on the left is stable. Electronically Signed   By: Inez Catalina M.D.   On: 12/19/2016 08:46   Dg Chest Port 1 View  Result Date: 12/18/2016 CLINICAL DATA:  Intubated patient, respiratory failure, asthma -COPD, CHF, current smoker. EXAM: PORTABLE CHEST 1 VIEW COMPARISON:  Portable chest x-ray of December 17, 2016 FINDINGS: The lungs are reasonably well inflated. There is persistent small left pleural effusion and cavitary lesion in the left mid lung. The right lung exhibits mild interstitial prominence inferiorly. The heart is normal in size. The pulmonary vascularity is not engorged. The endotracheal tube tip projects 4 cm above the carina. The esophagogastric tube tip projects below the inferior margin of the image. IMPRESSION: Persistent left mid lung cavitary lesion. A small left pleural effusion is slightly more conspicuous today. The support tubes are in reasonable position. Electronically Signed   By: David  Martinique M.D.   On: 12/18/2016 07:08   Dg Chest Port 1 View  Result Date: 12/17/2016 CLINICAL DATA:  Check endotracheal tube, shortness of Breath EXAM: PORTABLE CHEST 1 VIEW COMPARISON:  12/16/2016 FINDINGS: Cardiac shadow is stable. Endotracheal tube and nasogastric catheter are noted in satisfactory position. Stable cavitary lesion is noted in the left mid lung similar to that seen on prior exams. No new focal infiltrate is noted. Small pleural effusion in the left base is seen. IMPRESSION: Stable left cavitary lesion in left pleural effusion. Tubes and lines as  described. Electronically Signed   By: Inez Catalina M.D.   On: 12/17/2016 08:02   Dg Chest Port 1 View  Result Date: 12/16/2016 CLINICAL DATA:  Asthma, COPD, intubation, hypertension, smoker EXAM: PORTABLE CHEST 1 VIEW COMPARISON:  Portable exam 0810 hours compared 12/14/2016 FINDINGS: Tip  of endotracheal tube projects 3.3 cm above carina. Nasogastric tube extends into stomach. Upper normal heart size. Mediastinal contours and pulmonary vascularity normal. New large cavitary lesion within the lateral mid to lower LEFT chest at site of prior dense consolidation likely represents a lung abscess, 4.9 x 4.2 cm. Persistent infiltrate throughout the mid to lower LEFT lung again seen. Mild infiltrate lateral RIGHT base again noted. No definite pleural effusion or pneumothorax. Healing fractures of lower lateral RIGHT ribs again noted. IMPRESSION: Persistent infiltrates in mid to lower LEFT lung and at lateral RIGHT lung base with new area of cavitation at the lateral mid to lower LEFT lung consistent with a lung abscess. Electronically Signed   By: Lavonia Dana M.D.   On: 12/16/2016 08:33   Dg Chest Port 1 View  Result Date: 12/14/2016 CLINICAL DATA:  Evaluate ET tube placement EXAM: PORTABLE CHEST 1 VIEW COMPARISON:  December 13, 2016 FINDINGS: The ETT is in good position. The NG tube remains. No pneumothorax. Persistent bilateral pulmonary opacities, left greater than right for slightly more prominent in the right base and mildly decreased on the left in the interval. Recommend follow-up to complete resolution. IMPRESSION: 1. Stable support apparatus. 2. Bilateral pulmonary opacities persist, mildly improved on the left and mildly worsened in the right base. Electronically Signed   By: Dorise Bullion III M.D   On: 12/14/2016 08:01   Dg Chest Port 1 View  Result Date: 12/13/2016 CLINICAL DATA:  Evaluate for lung collapse. EXAM: PORTABLE CHEST 1 VIEW COMPARISON:  December 12, 2016 FINDINGS: There has been interval improvement on the left. While there is still opacity throughout the left mid and lower lung, the more focal infiltrate in the left base is much improved. Minimal opacity in the right base. No other interval change. ET and NG tubes are in good position. IMPRESSION: 1. Persistent bilateral  pulmonary opacities, greater on the left than the right. The more focal component of opacity in the left base is improved however. Recommend follow-up to complete resolution. Electronically Signed   By: Dorise Bullion III M.D   On: 12/13/2016 12:24   Dg Abd Portable 1v  Result Date: 12/28/2016 CLINICAL DATA:  Abdominal distension EXAM: PORTABLE ABDOMEN - 1 VIEW COMPARISON:  12/26/2016 abdominal radiograph FINDINGS: Enteric tube terminates in the distal stomach near the pylorus. Cholecystectomy clips are seen in the right upper quadrant of the abdomen. No disproportionately dilated small bowel loops. Mild gas in stool throughout the large bowel. No evidence of pneumatosis or pneumoperitoneum. Stable blunting of the costophrenic angles at the lung bases bilaterally. No pathologic soft tissue calcifications. IMPRESSION: 1. Enteric tube terminates in the distal stomach in the region of the pylorus . 2. Nonobstructive bowel gas pattern. Electronically Signed   By: Ilona Sorrel M.D.   On: 12/28/2016 13:15   Dg Abd Portable 1v  Result Date: 12/26/2016 CLINICAL DATA:  Nasogastric tube placement.  Initial encounter. EXAM: PORTABLE ABDOMEN - 1 VIEW COMPARISON:  Abdominal radiograph performed 12/24/2016 FINDINGS: The patient's enteric tube is seen ending overlying the antrum of the stomach. There has been interval placement of left-sided chest tubes. Clips are noted within the right upper quadrant, reflecting prior cholecystectomy. The visualized  bowel gas pattern is grossly unremarkable. No free intra-abdominal air is seen, though evaluation for free air is limited on a single supine view. No acute osseous abnormalities are identified. IMPRESSION: Enteric tube noted ending overlying the antrum of the stomach. Electronically Signed   By: Garald Balding M.D.   On: 12/26/2016 23:55   Dg Abd Portable 1v  Result Date: 12/24/2016 CLINICAL DATA:  Abdominal pain, labored breathing EXAM: PORTABLE ABDOMEN - 1 VIEW  COMPARISON:  12/23/2016 FINDINGS: Decreasing gaseous distention of large and small bowel. Gas within nondistended large small bowel currently. Oral contrast material noted within the right colon and scattered small bowel loops. IMPRESSION: Decreasing gaseous distention of large and small bowel. Electronically Signed   By: Rolm Baptise M.D.   On: 12/24/2016 11:20   Dg Abd Portable 1v  Result Date: 12/23/2016 CLINICAL DATA:  Ileus, pneumonia EXAM: PORTABLE ABDOMEN - 1 VIEW COMPARISON:  12/23/2016 FINDINGS: Mild gaseous distention of bowel, slightly improved since prior study. Oral contrast material noted within the appendix and right colon. No free air organomegaly. Prior cholecystectomy. IMPRESSION: Some improvement in diffuse gaseous distention of bowel. Electronically Signed   By: Rolm Baptise M.D.   On: 12/23/2016 11:21   Dg Abd Portable 1v  Result Date: 12/23/2016 CLINICAL DATA:  Nasogastric tube placement.  Initial encounter. EXAM: PORTABLE ABDOMEN - 1 VIEW COMPARISON:  CT of the abdomen pelvis performed 12/22/2016 FINDINGS: The patient's enteric tube is noted ending overlying the body of the stomach. Air and contrast filled dilated loops of colon are again noted, likely reflecting ileus. There is no definite evidence for bowel obstruction. No free intra-abdominal air is seen, though evaluation for free air is noted on a single supine view. No acute osseous abnormalities are seen. The patient's left-sided cavitary lung lesion is partially imaged. IMPRESSION: 1. Enteric tube noted ending overlying the body of the stomach. 2. Air and contrast filled dilated loops of colon again noted, likely reflecting ileus. No free intra-abdominal air seen. Electronically Signed   By: Garald Balding M.D.   On: 12/23/2016 00:43   Dg Abd Portable 1v  Result Date: 12/20/2016 CLINICAL DATA:  Initial evaluation for NG tube placement. EXAM: PORTABLE ABDOMEN - 1 VIEW COMPARISON:  Prior radiograph from 12/13/2016. FINDINGS:  NG tube in place with tip and side hole overlying the gastric body. Side hole well beyond the GE junction. Tip projects inferiorly. Multiple dilated gas-filled loops of bowel seen within the abdomen measuring up to 7.3 cm. Visualized lung bases grossly clear. IMPRESSION: 1. Tip and side hole of NG tube overlying the gastric body. Side hole beyond the GE junction. Tip projects inferiorly. 2. Multiple gas-filled loops of bowel seen throughout the partially visualized abdomen. Findings may reflect sequelae of ileus or possibly distal obstructive process. Electronically Signed   By: Jeannine Boga M.D.   On: 12/20/2016 22:02   Dg Swallowing Func-speech Pathology  Result Date: 01/02/2017 Objective Swallowing Evaluation: Type of Study: MBS-Modified Barium Swallow Study Patient Details Name: Mailin JACOBI RYANT MRN: [id number] Date of Birth: 06-Jan-1966 Today's Date: 01/02/2017 Time: SLP Start Time (ACUTE ONLY): 1415-SLP Stop Time (ACUTE ONLY): 1445 SLP Time Calculation (min) (ACUTE ONLY): 30 min Past Medical History: Past Medical History: Diagnosis Date . Allergic rhinitis  . Arthritis  . Asthma  . Back pain  . Bipolar 1 disorder ([place])  . Chronic low back pain 03/06/2014 . COPD (chronic obstructive pulmonary disease) ([place])  . Depression  . Fibroid  . Hypertension  . IBS (irritable  bowel syndrome)  . Migraine  . Scoliosis  Past Surgical History: Past Surgical History: Procedure Laterality Date . CHOLECYSTECTOMY   . DILATION AND CURETTAGE OF UTERUS   . EXPLORATORY LAPAROTOMY    WHEN PATIENT WAS YOUNG . RIGHT FOOT SURGERY   . TUBAL LIGATION Bilateral  . VIDEO ASSISTED THORACOSCOPY (VATS)/DECORTICATION Left 12/26/2016  Procedure: LEFT VIDEO ASSISTED THORACOSCOPY (VATS)/DECORTICATION;  Surgeon: Ivin Poot, MD;  Location: [facility];  Service: Thoracic;  Laterality: Left; Marland Kitchen VIDEO BRONCHOSCOPY N/A 12/26/2016  Procedure: VIDEO BRONCHOSCOPY WITH BRONCHIAL LAVAGE;  Surgeon: Ivin Poot, MD;  Location: [facility] OR;  Service: Thoracic;   Laterality: N/A; HPI: 51 y/o F, 45 pk year smoker, with Hx of COPD/asthma and bipolar disorder admitted 1/19 with SOB. Working diagnosis of acute asthma exacerbation, multilobar PNA with resultant hypoxic respiratory failure. Intubated 12/12/16 to 12/20/16. Left VATS procedure on 12/26/16. Reintubated 2/2-2/5.  Initial MBS 12/22/16  Subjective: alert for evaluation, requires cues to maintain alertness Assessment / Plan / Recommendation CHL IP CLINICAL IMPRESSIONS 01/02/2017 Therapy Diagnosis Mild oral phase dysphagia;Mild pharyngeal phase dysphagia Clinical Impression Pt demonstrates improved overall swallow function and airway protection.  She continues to demonstrate delayed onset of swallow to the pyriforms with thin liquids, and when consuming large, consecutive boluses, trace aspiration of thins was observed just below vocal folds.  No spontaneous cough response was elicited. However, when limiting boluses to single sips, there was only occasional high penetration, but no aspiration.  Pharyngeal clearance of all materials was excellent; oral phase WNL.  Given pt's improved mental status, recommend advancing diet to regular, thin liquids; meds whole in puree. She should follow basic precautions to ensure safety.  SLP will follow briefly to facilitate safety/education.  Impact on safety and function Mild aspiration risk   CHL IP TREATMENT RECOMMENDATION 12/22/2016 Treatment Recommendations Therapy as outlined in treatment plan below   Prognosis 12/22/2016 Prognosis for Safe Diet Advancement Good Barriers to Reach Goals -- Barriers/Prognosis Comment -- CHL IP DIET RECOMMENDATION 01/02/2017 SLP Diet Recommendations Regular solids;Thin liquid Liquid Administration via Cup;No straw Medication Administration Whole meds with puree Compensations Slow rate;Small sips/bites Postural Changes --   CHL IP OTHER RECOMMENDATIONS 01/02/2017 Recommended Consults -- Oral Care Recommendations Oral care BID Other Recommendations --   CHL IP  FOLLOW UP RECOMMENDATIONS 12/30/2016 Follow up Recommendations Other (comment)   CHL IP FREQUENCY AND DURATION 12/22/2016 Speech Therapy Frequency (ACUTE ONLY) min 2x/week Treatment Duration 2 weeks      CHL IP ORAL PHASE 01/02/2017 Oral Phase WFL Oral - Pudding Teaspoon -- Oral - Pudding Cup -- Oral - Honey Teaspoon -- Oral - Honey Cup -- Oral - Nectar Teaspoon -- Oral - Nectar Cup -- Oral - Nectar Straw -- Oral - Thin Teaspoon -- Oral - Thin Cup -- Oral - Thin Straw -- Oral - Puree -- Oral - Mech Soft -- Oral - Regular -- Oral - Multi-Consistency -- Oral - Pill -- Oral Phase - Comment --  CHL IP PHARYNGEAL PHASE 01/02/2017 Pharyngeal Phase Impaired Pharyngeal- Pudding Teaspoon -- Pharyngeal -- Pharyngeal- Pudding Cup -- Pharyngeal -- Pharyngeal- Honey Teaspoon -- Pharyngeal -- Pharyngeal- Honey Cup NT Pharyngeal -- Pharyngeal- Nectar Teaspoon NT Pharyngeal -- Pharyngeal- Nectar Cup Delayed swallow initiation-pyriform sinuses Pharyngeal Material does not enter airway Pharyngeal- Nectar Straw -- Pharyngeal -- Pharyngeal- Thin Teaspoon -- Pharyngeal -- Pharyngeal- Thin Cup Delayed swallow initiation-pyriform sinuses;Trace aspiration Pharyngeal -- Pharyngeal- Thin Straw -- Pharyngeal -- Pharyngeal- Puree Delayed swallow initiation-vallecula Pharyngeal -- Pharyngeal- Mechanical Soft -- Pharyngeal --  Pharyngeal- Regular -- Pharyngeal -- Pharyngeal- Multi-consistency -- Pharyngeal -- Pharyngeal- Pill -- Pharyngeal -- Pharyngeal Comment --  CHL IP CERVICAL ESOPHAGEAL PHASE 01/02/2017 Cervical Esophageal Phase WFL Pudding Teaspoon -- Pudding Cup -- Honey Teaspoon -- Honey Cup -- Nectar Teaspoon -- Nectar Cup -- Nectar Straw -- Thin Teaspoon -- Thin Cup -- Thin Straw -- Puree -- Mechanical Soft -- Regular -- Multi-consistency -- Pill -- Cervical Esophageal Comment -- No flowsheet data found. Juan Quam Laurice 01/02/2017, 3:17 PM              Dg Swallowing Func-speech Pathology  Result Date: 12/22/2016 Objective Swallowing  Evaluation: Type of Study: MBS-Modified Barium Swallow Study Patient Details Name: Annamarie BODHI STENGLEIN MRN: [id number] Date of Birth: 11-Sep-1966 Today's Date: 12/22/2016 Time: SLP Start Time (ACUTE ONLY): 0951-SLP Stop Time (ACUTE ONLY): 1012 SLP Time Calculation (min) (ACUTE ONLY): 21 min Past Medical History: Past Medical History: Diagnosis Date . Allergic rhinitis  . Arthritis  . Asthma  . Back pain  . Bipolar 1 disorder ([place])  . Chronic low back pain 03/06/2014 . COPD (chronic obstructive pulmonary disease) ([place])  . Depression  . Fibroid  . Hypertension  . IBS (irritable bowel syndrome)  . Migraine  . Scoliosis  Past Surgical History: Past Surgical History: Procedure Laterality Date . CHOLECYSTECTOMY   . DILATION AND CURETTAGE OF UTERUS   . EXPLORATORY LAPAROTOMY    WHEN PATIENT WAS YOUNG . RIGHT FOOT SURGERY   . TUBAL LIGATION Bilateral  HPI: 51 y/o F, 45 pk year smoker, with Hx of COPD/asthma and bipolar disorder admitted 1/19 with SOB. Working diagnosis of acute asthma exacerbation, multilobar PNA with resultant hypoxic respiratory failure. Intubated 12/12/16 to 12/20/16.  Subjective: alert for evaluation, requires cues to maintain alertness Assessment / Plan / Recommendation CHL IP CLINICAL IMPRESSIONS 12/22/2016 Therapy Diagnosis Mild pharyngeal phase dysphagia;Moderate pharyngeal phase dysphagia  Clinical Impression Ms. Manternach was cooperative but drowsy during the MBS and required moderate cueing to attend to the assessment and POs. Pt exhibits mild-moderate pharyngeal dysphagia characterized by delayed swallow initiation that results in silent penetration (able to clear with 2nd reflexive swallow) during nectar thick consistencies. Thicker textures were better contained in the valleculae before the swallow. Backflow from upper esophageal segment to pharynx also noted with nectar thick liquids. Due to prolonged intubation, fluctuating mentation/alertness, and hx of CVA, recommend diet of honey thick liquids, pureed  solids, meds whole in puree, with full supervision during mealtimes, small bites/sips, seated upright. ST will f/u with pt for treatment to assess safety/efficiency of swallowing function and possible diet upgrade.  Impact on safety and function Moderate aspiration risk   CHL IP TREATMENT RECOMMENDATION 12/22/2016 Treatment Recommendations Therapy as outlined in treatment plan below   Prognosis 12/22/2016 Prognosis for Safe Diet Advancement Good Barriers to Reach Goals -- Barriers/Prognosis Comment -- CHL IP DIET RECOMMENDATION 12/22/2016 SLP Diet Recommendations Dysphagia 1 (Puree) solids;Honey thick liquids Liquid Administration via Cup;No straw Medication Administration Whole meds with puree Compensations Slow rate;Small sips/bites Postural Changes Seated upright at 90 degrees;Remain semi-upright after after feeds/meals (Comment)   CHL IP OTHER RECOMMENDATIONS 12/22/2016 Recommended Consults -- Oral Care Recommendations Oral care BID Other Recommendations Order thickener from pharmacy;Prohibited food (jello, ice cream, thin soups);Remove water pitcher   CHL IP FOLLOW UP RECOMMENDATIONS 12/22/2016 Follow up Recommendations Skilled Nursing facility   [facility] IP FREQUENCY AND DURATION 12/22/2016 Speech Therapy Frequency (ACUTE ONLY) min 2x/week Treatment Duration 2 weeks      CHL IP ORAL PHASE 12/22/2016  Oral Phase WFL Oral - Pudding Teaspoon -- Oral - Pudding Cup -- Oral - Honey Teaspoon -- Oral - Honey Cup -- Oral - Nectar Teaspoon -- Oral - Nectar Cup -- Oral - Nectar Straw -- Oral - Thin Teaspoon -- Oral - Thin Cup -- Oral - Thin Straw -- Oral - Puree -- Oral - Mech Soft -- Oral - Regular -- Oral - Multi-Consistency -- Oral - Pill -- Oral Phase - Comment --  CHL IP PHARYNGEAL PHASE 12/22/2016 Pharyngeal Phase Impaired Pharyngeal- Pudding Teaspoon -- Pharyngeal -- Pharyngeal- Pudding Cup -- Pharyngeal -- Pharyngeal- Honey Teaspoon -- Pharyngeal -- Pharyngeal- Honey Cup Delayed swallow initiation-vallecula;Other (Comment)  Pharyngeal -- Pharyngeal- Nectar Teaspoon Delayed swallow initiation-pyriform sinuses;Penetration/Aspiration before swallow;Other (Comment) Pharyngeal Material enters airway, remains ABOVE vocal cords then ejected out Pharyngeal- Nectar Cup Delayed swallow initiation-pyriform sinuses;Other (Comment);Penetration/Aspiration before swallow Pharyngeal Material enters airway, remains ABOVE vocal cords then ejected out Pharyngeal- Nectar Straw -- Pharyngeal -- Pharyngeal- Thin Teaspoon -- Pharyngeal -- Pharyngeal- Thin Cup -- Pharyngeal -- Pharyngeal- Thin Straw -- Pharyngeal -- Pharyngeal- Puree Delayed swallow initiation-vallecula Pharyngeal -- Pharyngeal- Mechanical Soft -- Pharyngeal -- Pharyngeal- Regular -- Pharyngeal -- Pharyngeal- Multi-consistency -- Pharyngeal -- Pharyngeal- Pill -- Pharyngeal -- Pharyngeal Comment --  CHL IP CERVICAL ESOPHAGEAL PHASE 12/22/2016 Cervical Esophageal Phase Impaired Pudding Teaspoon -- Pudding Cup -- Honey Teaspoon -- Honey Cup -- Nectar Teaspoon -- Nectar Cup Esophageal backflow into the pharynx Nectar Straw -- Thin Teaspoon -- Thin Cup -- Thin Straw -- Puree -- Mechanical Soft -- Regular -- Multi-consistency -- Pill -- Cervical Esophageal Comment -- No flowsheet data found. Germain Osgood 12/22/2016, 11:25 AM  Note populated for [name], student SLP Germain Osgood, M.A. CCC-SLP [phone]               Discharge Exam: Vitals:   01/12/17 0839 01/12/17 0940  BP: (!) 143/73   Pulse: 86 98  Resp:    Temp:     Vitals:   01/12/17 0426 01/12/17 0728 01/12/17 0839 01/12/17 0940  BP:   (!) 143/73   Pulse:   86 98  Resp:      Temp:      TempSrc:      SpO2: (!) 84% 96%  91%  Weight:      Height:        General: Pt is alert, follows commands appropriately, not in acute distress Cardiovascular: Regular rate and rhythm, S1/S2 +, no murmurs, no rubs, no gallops Respiratory: Clear to auscultation bilaterally, no wheezing, no crackles, no  rhonchi Abdominal: Soft, non tender, non distended, bowel sounds +, no guarding  Discharge Instructions  Discharge Instructions    Diet - low sodium heart healthy    Complete by:  As directed    Increase activity slowly    Complete by:  As directed      Allergies as of 01/12/2017      Reactions   Cymbalta [duloxetine Hcl] Other (See Comments)   Mood changes...mean   Mucinex [guaifenesin Er] Other (See Comments)   Possible allergy, per patient (??)   Nyquil Multi-symptom [pseudoeph-doxylamine-dm-apap] Hives   Paxil [paroxetine Hcl] Other (See Comments)   Mean and aggresive   Promethazine Itching   Tramadol Itching   Wellbutrin [bupropion] Other (See Comments)   Rapid heartrate   Zoloft [sertraline] Other (See Comments)   Anger      Medication List    STOP taking these medications   escitalopram 20 MG tablet Commonly known as:  LEXAPRO  gabapentin 400 MG capsule Commonly known as:  NEURONTIN   lamoTRIgine 100 MG tablet Commonly known as:  LAMICTAL     TAKE these medications   budesonide 0.5 MG/2ML nebulizer solution Commonly known as:  PULMICORT Take 2 mLs (0.5 mg total) by nebulization 2 (two) times daily.   fluconazole 200 MG tablet Commonly known as:  DIFLUCAN Take 1 tablet (200 mg total) by mouth daily. Start taking on:  01/13/2017   hydrALAZINE 25 MG tablet Commonly known as:  APRESOLINE Take 1 tablet (25 mg total) by mouth every 8 (eight) hours.   HYDROcodone-homatropine 5-1.5 MG/5ML syrup Commonly known as:  HYCODAN Take 5 mLs by mouth every 4 (four) hours as needed for cough.   ipratropium-albuterol 0.5-2.5 (3) MG/3ML Soln Commonly known as:  DUONEB Take 3 mLs by nebulization every 6 (six) hours as needed.   linezolid 600 MG tablet Commonly known as:  ZYVOX Take 1 tablet (600 mg total) by mouth every 12 (twelve) hours. Continue taking until 01/15/2017.    metoprolol tartrate 25 MG tablet Commonly known as:  LOPRESSOR Take 25 mg by mouth 2  (two) times daily. Reported on 02/21/2016   oxyCODONE 5 MG immediate release tablet Commonly known as:  Oxy IR/ROXICODONE Take 1 tablet (5 mg total) by mouth every 6 (six) hours as needed for moderate pain.   pantoprazole 40 MG tablet Commonly known as:  PROTONIX Take 1 tablet (40 mg total) by mouth daily at 12 noon.   QUEtiapine 100 MG tablet Commonly known as:  SEROQUEL Take 1 tablet (100 mg total) by mouth at bedtime.   vancomycin 50 mg/mL oral solution Commonly known as:  VANCOCIN Take 5 mLs (250 mg total) by mouth every 6 (six) hours. Stop date 01/22/2017 after the last dose       Contact information for follow-up providers    Marshell Garfinkel, MD Follow up on 01/02/2017.   Specialty:  Pulmonary Disease Why:  at 945 am  Contact information: [address] [place] [address] [phone]        Tharon Aquas Kerby Less III, MD Follow up.   Specialty:  Cardiothoracic Surgery Why:  Appointment is at       Please get CXR 30 min prior to your appointment at [facility] imaging located on first floor of our office building Contact information: [place] [address] [phone]            Contact information for after-discharge care    Destination    [facility] Follow up.   Specialty:  [place] information: [address] [place] [phone]                   The results of significant diagnostics from this hospitalization (including imaging, microbiology, ancillary and laboratory) are listed below for reference.     Labs: Basic Metabolic Panel:  Recent Labs Lab 01/07/17 0500 01/08/17 0331 01/09/17 0351 01/10/17 0602 01/11/17 0419 01/12/17 0639  NA 134* 134* 138 138 139 140  K 4.5 4.4 4.4 4.2 4.3 3.9  CL 96* 100* 103 103 101 100*  CO2 _0 GLUCOSE 97 91 103* 93 89 93  BUN _1 CREATININE 3.31* 3.66* 3.81* 3.75* 3.75*  3.37*  CALCIUM 8.7* 8.4* 8.8* 9.1 9.4 8.9  MG 1.9  --   --   --   --   --  PHOS  --  6.7*  --  6.7*  --   --    Liver Function Tests:  Recent Labs Lab 01/08/17 0331 01/10/17 0602  ALBUMIN 1.8* 2.0*   CBC:  Recent Labs Lab 01/08/17 0500 01/08/17 1245 01/09/17 0351 01/10/17 0601 01/11/17 0419 01/12/17 0639  WBC 8.7  --  7.4 4.7 6.1 4.6  HGB 7.6* 8.4* 8.0* 7.8* 8.4* 7.9*  HCT 24.4* 27.5* 25.7* 25.4* 28.0* 25.5*  MCV 92.4  --  93.8 93.0 92.7 92.4  PLT 236  --  254 283 332 309    SIGNED: Time coordinating discharge: 30 minutes  MAGICK-Tyreonna Czaplicki, MD  [facility] 01/12/2017, 11:27 AM Pager [phone]  If 7PM-7AM, please contact night-coverage [email or web address] Password [email or web address]

## 2017-01-15 ENCOUNTER — Inpatient Hospital Stay (HOSPITAL_COMMUNITY)
Admission: EM | Admit: 2017-01-15 | Discharge: 2017-01-22 | DRG: 070 | Disposition: A | Discharge status: Skilled Nursing Facility | Payer: Medicaid Other | Attending: Family Medicine | Admitting: Family Medicine

## 2017-01-15 ENCOUNTER — Emergency Department (HOSPITAL_COMMUNITY): Payer: Medicaid Other

## 2017-01-15 ENCOUNTER — Encounter (HOSPITAL_COMMUNITY): Payer: Self-pay | Admitting: Emergency Medicine

## 2017-01-15 DIAGNOSIS — H539 Unspecified visual disturbance: Secondary | ICD-10-CM | POA: Diagnosis present

## 2017-01-15 DIAGNOSIS — Z8614 Personal history of Methicillin resistant Staphylococcus aureus infection: Secondary | ICD-10-CM

## 2017-01-15 DIAGNOSIS — K580 Irritable bowel syndrome with diarrhea: Secondary | ICD-10-CM | POA: Diagnosis present

## 2017-01-15 DIAGNOSIS — Z515 Encounter for palliative care: Secondary | ICD-10-CM

## 2017-01-15 DIAGNOSIS — Z823 Family history of stroke: Secondary | ICD-10-CM

## 2017-01-15 DIAGNOSIS — B001 Herpesviral vesicular dermatitis: Secondary | ICD-10-CM | POA: Diagnosis present

## 2017-01-15 DIAGNOSIS — J852 Abscess of lung without pneumonia: Secondary | ICD-10-CM | POA: Diagnosis present

## 2017-01-15 DIAGNOSIS — Z9049 Acquired absence of other specified parts of digestive tract: Secondary | ICD-10-CM | POA: Diagnosis not present

## 2017-01-15 DIAGNOSIS — F17210 Nicotine dependence, cigarettes, uncomplicated: Secondary | ICD-10-CM | POA: Diagnosis present

## 2017-01-15 DIAGNOSIS — Z66 Do not resuscitate: Secondary | ICD-10-CM | POA: Diagnosis present

## 2017-01-15 DIAGNOSIS — F319 Bipolar disorder, unspecified: Secondary | ICD-10-CM | POA: Diagnosis present

## 2017-01-15 DIAGNOSIS — Z7189 Other specified counseling: Secondary | ICD-10-CM | POA: Diagnosis not present

## 2017-01-15 DIAGNOSIS — G9340 Encephalopathy, unspecified: Secondary | ICD-10-CM

## 2017-01-15 DIAGNOSIS — K219 Gastro-esophageal reflux disease without esophagitis: Secondary | ICD-10-CM | POA: Diagnosis present

## 2017-01-15 DIAGNOSIS — E876 Hypokalemia: Secondary | ICD-10-CM | POA: Diagnosis not present

## 2017-01-15 DIAGNOSIS — G894 Chronic pain syndrome: Secondary | ICD-10-CM | POA: Diagnosis present

## 2017-01-15 DIAGNOSIS — M545 Low back pain: Secondary | ICD-10-CM | POA: Diagnosis present

## 2017-01-15 DIAGNOSIS — R0682 Tachypnea, not elsewhere classified: Secondary | ICD-10-CM | POA: Diagnosis not present

## 2017-01-15 DIAGNOSIS — R569 Unspecified convulsions: Secondary | ICD-10-CM

## 2017-01-15 DIAGNOSIS — A0472 Enterocolitis due to Clostridium difficile, not specified as recurrent: Secondary | ICD-10-CM | POA: Diagnosis present

## 2017-01-15 DIAGNOSIS — I6783 Posterior reversible encephalopathy syndrome: Secondary | ICD-10-CM | POA: Diagnosis not present

## 2017-01-15 DIAGNOSIS — Z7951 Long term (current) use of inhaled steroids: Secondary | ICD-10-CM

## 2017-01-15 DIAGNOSIS — N179 Acute kidney failure, unspecified: Secondary | ICD-10-CM | POA: Diagnosis present

## 2017-01-15 DIAGNOSIS — G4089 Other seizures: Secondary | ICD-10-CM | POA: Diagnosis present

## 2017-01-15 DIAGNOSIS — Z8249 Family history of ischemic heart disease and other diseases of the circulatory system: Secondary | ICD-10-CM

## 2017-01-15 DIAGNOSIS — R739 Hyperglycemia, unspecified: Secondary | ICD-10-CM | POA: Diagnosis present

## 2017-01-15 DIAGNOSIS — D649 Anemia, unspecified: Secondary | ICD-10-CM | POA: Diagnosis present

## 2017-01-15 DIAGNOSIS — J851 Abscess of lung with pneumonia: Secondary | ICD-10-CM | POA: Diagnosis not present

## 2017-01-15 DIAGNOSIS — I10 Essential (primary) hypertension: Secondary | ICD-10-CM | POA: Diagnosis not present

## 2017-01-15 DIAGNOSIS — Z79899 Other long term (current) drug therapy: Secondary | ICD-10-CM

## 2017-01-15 DIAGNOSIS — N289 Disorder of kidney and ureter, unspecified: Secondary | ICD-10-CM | POA: Diagnosis not present

## 2017-01-15 DIAGNOSIS — Z4659 Encounter for fitting and adjustment of other gastrointestinal appliance and device: Secondary | ICD-10-CM

## 2017-01-15 DIAGNOSIS — J449 Chronic obstructive pulmonary disease, unspecified: Secondary | ICD-10-CM | POA: Diagnosis present

## 2017-01-15 DIAGNOSIS — M797 Fibromyalgia: Secondary | ICD-10-CM | POA: Diagnosis present

## 2017-01-15 DIAGNOSIS — Z8673 Personal history of transient ischemic attack (TIA), and cerebral infarction without residual deficits: Secondary | ICD-10-CM

## 2017-01-15 DIAGNOSIS — N170 Acute kidney failure with tubular necrosis: Secondary | ICD-10-CM | POA: Diagnosis not present

## 2017-01-15 DIAGNOSIS — Z888 Allergy status to other drugs, medicaments and biological substances status: Secondary | ICD-10-CM | POA: Diagnosis not present

## 2017-01-15 HISTORY — DX: Other disorders of lung: J98.4

## 2017-01-15 HISTORY — DX: Enterocolitis due to Clostridium difficile, not specified as recurrent: A04.72

## 2017-01-15 HISTORY — DX: Hypomagnesemia: E83.42

## 2017-01-15 LAB — CBC
HEMATOCRIT: 35.9 % — AB (ref 36.0–46.0)
HEMOGLOBIN: 11.5 g/dL — AB (ref 12.0–15.0)
MCH: 28.8 pg (ref 26.0–34.0)
MCHC: 32 g/dL (ref 30.0–36.0)
MCV: 89.8 fL (ref 78.0–100.0)
Platelets: 504 10*3/uL — ABNORMAL HIGH (ref 150–400)
RBC: 4 MIL/uL (ref 3.87–5.11)
RDW: 15.2 % (ref 11.5–15.5)
WBC: 14.8 10*3/uL — AB (ref 4.0–10.5)

## 2017-01-15 LAB — DIFFERENTIAL
Basophils Absolute: 0 10*3/uL (ref 0.0–0.1)
Basophils Relative: 0 %
Eosinophils Absolute: 0 10*3/uL (ref 0.0–0.7)
Eosinophils Relative: 0 %
LYMPHS PCT: 11 %
Lymphs Abs: 1.7 10*3/uL (ref 0.7–4.0)
MONO ABS: 1.4 10*3/uL — AB (ref 0.1–1.0)
MONOS PCT: 10 %
NEUTROS ABS: 11.6 10*3/uL — AB (ref 1.7–7.7)
Neutrophils Relative %: 79 %

## 2017-01-15 LAB — COMPREHENSIVE METABOLIC PANEL
ALK PHOS: 128 U/L — AB (ref 38–126)
ALT: 15 U/L (ref 14–54)
AST: 30 U/L (ref 15–41)
Albumin: 3.3 g/dL — ABNORMAL LOW (ref 3.5–5.0)
Anion gap: 23 — ABNORMAL HIGH (ref 5–15)
BUN: 13 mg/dL (ref 6–20)
CALCIUM: 9.3 mg/dL (ref 8.9–10.3)
CO2: 30 mmol/L (ref 22–32)
CREATININE: 1.65 mg/dL — AB (ref 0.44–1.00)
Chloride: 86 mmol/L — ABNORMAL LOW (ref 101–111)
GFR, EST AFRICAN AMERICAN: 41 mL/min — AB (ref >60)
GFR, EST NON AFRICAN AMERICAN: 35 mL/min — AB (ref >60)
Glucose, Bld: 156 mg/dL — ABNORMAL HIGH (ref 65–99)
Potassium: 2.9 mmol/L — ABNORMAL LOW (ref 3.5–5.1)
Sodium: 139 mmol/L (ref 135–145)
Total Bilirubin: 0.7 mg/dL (ref 0.3–1.2)
Total Protein: 7.6 g/dL (ref 6.5–8.1)

## 2017-01-15 LAB — RAPID URINE DRUG SCREEN, HOSP PERFORMED
AMPHETAMINES: NOT DETECTED
BARBITURATES: NOT DETECTED
Benzodiazepines: NOT DETECTED
Cocaine: NOT DETECTED
Opiates: POSITIVE — AB
Tetrahydrocannabinol: NOT DETECTED

## 2017-01-15 LAB — URINALYSIS, ROUTINE W REFLEX MICROSCOPIC
BILIRUBIN URINE: NEGATIVE
Glucose, UA: NEGATIVE mg/dL
Hgb urine dipstick: NEGATIVE
KETONES UR: NEGATIVE mg/dL
Leukocytes, UA: NEGATIVE
NITRITE: NEGATIVE
PROTEIN: NEGATIVE mg/dL
SPECIFIC GRAVITY, URINE: 1.008 (ref 1.005–1.030)
pH: 8 (ref 5.0–8.0)

## 2017-01-15 LAB — I-STAT CHEM 8, ED
BUN: 12 mg/dL (ref 6–20)
Calcium, Ion: 1 mmol/L — ABNORMAL LOW (ref 1.15–1.40)
Chloride: 87 mmol/L — ABNORMAL LOW (ref 101–111)
Creatinine, Ser: 1.4 mg/dL — ABNORMAL HIGH (ref 0.44–1.00)
GLUCOSE: 161 mg/dL — AB (ref 65–99)
HCT: 38 % (ref 36.0–46.0)
Hemoglobin: 12.9 g/dL (ref 12.0–15.0)
Potassium: 2.9 mmol/L — ABNORMAL LOW (ref 3.5–5.1)
Sodium: 135 mmol/L (ref 135–145)
TCO2: 35 mmol/L (ref 0–100)

## 2017-01-15 LAB — PROTIME-INR
INR: 1.09
Prothrombin Time: 14.1 seconds (ref 11.4–15.2)

## 2017-01-15 LAB — ETHANOL: Alcohol, Ethyl (B): <5 mg/dL (ref <5)

## 2017-01-15 LAB — CBG MONITORING, ED: GLUCOSE-CAPILLARY: 156 mg/dL — AB (ref 65–99)

## 2017-01-15 LAB — APTT: aPTT: 29 seconds (ref 24–36)

## 2017-01-15 LAB — I-STAT TROPONIN, ED: TROPONIN I, POC: 0.01 ng/mL (ref 0.00–0.08)

## 2017-01-15 LAB — MAGNESIUM: MAGNESIUM: 1.2 mg/dL — AB (ref 1.7–2.4)

## 2017-01-15 MED ORDER — LORAZEPAM 2 MG/ML IJ SOLN
2.0000 mg | INTRAMUSCULAR | Status: DC | PRN
  Administered 2017-01-19: 2 mg via INTRAVENOUS
  Filled 2017-01-15: qty 1

## 2017-01-15 MED ORDER — SODIUM CHLORIDE 0.9 % IV SOLN
1000.0000 mg | Freq: Once | INTRAVENOUS | Status: AC
  Administered 2017-01-15: 1000 mg via INTRAVENOUS
  Filled 2017-01-15: qty 10

## 2017-01-15 MED ORDER — SODIUM CHLORIDE 0.9 % IV SOLN: 30.0000 meq | Freq: Once | INTRAVENOUS | Status: DC

## 2017-01-15 MED ORDER — POTASSIUM CHLORIDE 10 MEQ/100ML IV SOLN
10.0000 meq | INTRAVENOUS | Status: AC
  Administered 2017-01-15: 10 meq via INTRAVENOUS
  Filled 2017-01-15 (×6): qty 100

## 2017-01-15 MED ORDER — LORAZEPAM 2 MG/ML IJ SOLN
2.0000 mg | Freq: Once | INTRAMUSCULAR | Status: AC
  Administered 2017-01-15: 2 mg via INTRAVENOUS

## 2017-01-15 MED ORDER — SODIUM CHLORIDE 0.9 % IV SOLN: 1000.0000 mg | Freq: Two times a day (BID) | INTRAVENOUS | Status: DC

## 2017-01-15 MED ORDER — HYDRALAZINE HCL 20 MG/ML IJ SOLN
10.0000 mg | Freq: Four times a day (QID) | INTRAMUSCULAR | Status: DC | PRN
Start: 2017-01-15 — End: 2017-01-17
  Administered 2017-01-16 – 2017-01-17 (×4): 10 mg via INTRAVENOUS
  Filled 2017-01-15 (×5): qty 1

## 2017-01-15 MED ORDER — SODIUM CHLORIDE 0.9 % IV SOLN
1000.0000 mg | Freq: Two times a day (BID) | INTRAVENOUS | Status: DC
  Administered 2017-01-16 – 2017-01-22 (×13): 1000 mg via INTRAVENOUS
  Filled 2017-01-15 (×15): qty 10

## 2017-01-15 MED ORDER — POTASSIUM CHLORIDE CRYS ER 20 MEQ PO TBCR: 40.0000 meq | EXTENDED_RELEASE_TABLET | Freq: Once | ORAL | Status: DC

## 2017-01-15 MED ORDER — SODIUM CHLORIDE 0.9 % IV BOLUS (SEPSIS)
1000.0000 mL | Freq: Once | INTRAVENOUS | Status: AC
  Administered 2017-01-15: 1000 mL via INTRAVENOUS

## 2017-01-15 NOTE — ED Notes (Signed)
Spoke with [facility].  Truck in route.  Spoke with Dr. Maudie Mercury.  Will order IV K.  Pt on enteric due to recent c-diff.

## 2017-01-15 NOTE — Consult Note (Signed)
Admission H&P    Chief Complaint: New onset of visual changes and seizure activity.  HPI: [name] is an 51 y.o. female with a history of strep pneumonia with empyema, hypertension, COPD, bipolar affective disorder and migraine headaches, brought to the ED at [facility] following acute onset of visual changes around 3:00 PM today. On arrival in the emergency room she had a witnessed generalized seizure. She has no previous history of seizure activity. Blood pressure was noted to be markedly elevated at 215/127. She was treated with IV hydralazine. CT scan of her head showed areas of low density involving right and left occipital regions as well as left cerebellum. MRI is pending. Patient was given 2 mg of Ativan with new onset seizure activity. She had a subsequent generalized seizure in route from [facility] to [facility] for admission. She was given Keppra 1000 mg IV. She is being admitted to stepdown unit for further management.  Past Medical History:  Diagnosis Date  . Allergic rhinitis   . Arthritis   . Asthma   . Back pain   . Bipolar 1 disorder ([place])   . C. difficile diarrhea   . Cavitary lesion of lung   . Chronic low back pain 03/06/2014  . COPD (chronic obstructive pulmonary disease) ([place])   . Depression   . Fibroid   . Hypertension   . Hypomagnesemia   . IBS (irritable bowel syndrome)   . Migraine   . Scoliosis     Past Surgical History:  Procedure Laterality Date  . CHOLECYSTECTOMY    . DILATION AND CURETTAGE OF UTERUS    . EXPLORATORY LAPAROTOMY     WHEN PATIENT WAS YOUNG  . RIGHT FOOT SURGERY    . TUBAL LIGATION Bilateral   . VIDEO ASSISTED THORACOSCOPY (VATS)/DECORTICATION Left 12/26/2016   Procedure: LEFT VIDEO ASSISTED THORACOSCOPY (VATS)/DECORTICATION;  Surgeon: Ivin Poot, MD;  Location: [place];  Service: Thoracic;  Laterality: Left;  Marland Kitchen VIDEO BRONCHOSCOPY N/A 12/26/2016   Procedure: VIDEO BRONCHOSCOPY WITH BRONCHIAL LAVAGE;  Surgeon: Ivin Poot, MD;  Location: [facility] OR;  Service: Thoracic;  Laterality: N/A;    Family History  Problem Relation Age of Onset  . Stroke Mother   . Hypertension Brother   . Hypertension Brother   . Hypertension Brother   . Allergic rhinitis Neg Hx   . Angioedema Neg Hx   . Asthma Neg Hx   . Atopy Neg Hx   . Eczema Neg Hx   . Immunodeficiency Neg Hx   . Urticaria Neg Hx    Social History:  reports that she has been smoking Cigarettes.  She has been smoking about 0.50 packs per day. She has never used smokeless tobacco. She reports that she does not drink alcohol or use drugs.  Allergies:  Allergies  Allergen Reactions  . Cymbalta [Duloxetine Hcl] Other (See Comments)    Mood changes...mean  . Mucinex [Guaifenesin Er] Other (See Comments)    Possible allergy, per patient (??)  . Nyquil Multi-Symptom [Pseudoeph-Doxylamine-Dm-Apap] Hives  . Paxil [Paroxetine Hcl] Other (See Comments)    Mean and aggresive  . Promethazine Itching  . Tramadol Itching  . Wellbutrin [Bupropion] Other (See Comments)    Rapid heartrate  . Zoloft [Sertraline] Other (See Comments)    Anger    Medications: Preadmission medications were reviewed by me.  ROS: Unavailable due to patient's altered mental status.  Physical Examination: Blood pressure (!) 149/101, pulse (!) 121, temperature 98.3 F (  36.8 C), temperature source Axillary, resp. rate 25, SpO2 93 %.  HEENT-  Normocephalic, no lesions, without obvious abnormality.  Normal external eye and conjunctiva.  Normal TM's bilaterally.  Normal auditory canals and external ears. Normal external nose, mucus membranes and septum.  Normal pharynx. Neck supple with no masses, nodes, nodules or enlargement. Cardiovascular - tachycardic, regular rhythm, normal S1 and S2, no murmur or gallop Lungs - chest clear, no wheezing, rales, normal symmetric air entry Abdomen - soft, non-tender; bowel sounds normal; no masses,  no organomegaly Extremities - no joint  deformities, effusion, or inflammation  Neurologic Examination: Patient was lethargic and disoriented to time. She was oriented to person and place. She was only moderately cooperative with examination. Pupils were equal and reacted normally to light. Extraocular movements limited to midline and to the right side, with gaze preference conjugately to the right. Responses to visual field testing were variable and difficult to interpret. Face was symmetrical with no focal weakness. Motor exam showed equal strength proximally and distally in all 4 extremities. Muscle tone was flaccid at rest. Deep tendon reflexes were 2+ and symmetrical. Plantar responses were flexor bilaterally.  Results for orders placed or performed during the hospital encounter of 01/15/17 (from the past 48 hour(s))  Urine rapid drug screen (hosp performed)not at [facility]     Status: Abnormal   Collection Time: 01/15/17  5:19 PM  Result Value Ref Range   Opiates POSITIVE (A) NONE DETECTED   Cocaine NONE DETECTED NONE DETECTED   Benzodiazepines NONE DETECTED NONE DETECTED   Amphetamines NONE DETECTED NONE DETECTED   Tetrahydrocannabinol NONE DETECTED NONE DETECTED   Barbiturates NONE DETECTED NONE DETECTED    Comment:        DRUG SCREEN FOR MEDICAL PURPOSES ONLY.  IF CONFIRMATION IS NEEDED FOR ANY PURPOSE, NOTIFY LAB WITHIN 5 DAYS.        LOWEST DETECTABLE LIMITS FOR URINE DRUG SCREEN Drug Class       Cutoff (ng/mL) Amphetamine      1000 Barbiturate      200 Benzodiazepine   474 Tricyclics       259 Opiates          300 Cocaine          300 THC              50   Urinalysis, Routine w reflex microscopic     Status: Abnormal   Collection Time: 01/15/17  5:19 PM  Result Value Ref Range   Color, Urine STRAW (A) YELLOW   APPearance CLEAR CLEAR   Specific Gravity, Urine 1.008 1.005 - 1.030   pH 8.0 5.0 - 8.0   Glucose, UA NEGATIVE NEGATIVE mg/dL   Hgb urine dipstick NEGATIVE NEGATIVE   Bilirubin Urine NEGATIVE  NEGATIVE   Ketones, ur NEGATIVE NEGATIVE mg/dL   Protein, ur NEGATIVE NEGATIVE mg/dL   Nitrite NEGATIVE NEGATIVE   Leukocytes, UA NEGATIVE NEGATIVE  Ethanol     Status: None   Collection Time: 01/15/17  5:30 PM  Result Value Ref Range   Alcohol, Ethyl (B) <5 <5 mg/dL    Comment:        LOWEST DETECTABLE LIMIT FOR SERUM ALCOHOL IS 5 mg/dL FOR MEDICAL PURPOSES ONLY   Protime-INR     Status: None   Collection Time: 01/15/17  5:30 PM  Result Value Ref Range   Prothrombin Time 14.1 11.4 - 15.2 seconds   INR 1.09   APTT     Status: None  Collection Time: 01/15/17  5:30 PM  Result Value Ref Range   aPTT 29 24 - 36 seconds  CBC     Status: Abnormal   Collection Time: 01/15/17  5:30 PM  Result Value Ref Range   WBC 14.8 (H) 4.0 - 10.5 K/uL   RBC 4.00 3.87 - 5.11 MIL/uL   Hemoglobin 11.5 (L) 12.0 - 15.0 g/dL   HCT 35.9 (L) 36.0 - 46.0 %   MCV 89.8 78.0 - 100.0 fL   MCH 28.8 26.0 - 34.0 pg   MCHC 32.0 30.0 - 36.0 g/dL   RDW 15.2 11.5 - 15.5 %   Platelets 504 (H) 150 - 400 K/uL  Differential     Status: Abnormal   Collection Time: 01/15/17  5:30 PM  Result Value Ref Range   Neutrophils Relative % 79 %   Neutro Abs 11.6 (H) 1.7 - 7.7 K/uL   Lymphocytes Relative 11 %   Lymphs Abs 1.7 0.7 - 4.0 K/uL   Monocytes Relative 10 %   Monocytes Absolute 1.4 (H) 0.1 - 1.0 K/uL   Eosinophils Relative 0 %   Eosinophils Absolute 0.0 0.0 - 0.7 K/uL   Basophils Relative 0 %   Basophils Absolute 0.0 0.0 - 0.1 K/uL  Comprehensive metabolic panel     Status: Abnormal   Collection Time: 01/15/17  5:30 PM  Result Value Ref Range   Sodium 139 135 - 145 mmol/L   Potassium 2.9 (L) 3.5 - 5.1 mmol/L   Chloride 86 (L) 101 - 111 mmol/L   CO2 30 22 - 32 mmol/L   Glucose, Bld 156 (H) 65 - 99 mg/dL   BUN 13 6 - 20 mg/dL   Creatinine, Ser 1.65 (H) 0.44 - 1.00 mg/dL   Calcium 9.3 8.9 - 10.3 mg/dL   Total Protein 7.6 6.5 - 8.1 g/dL   Albumin 3.3 (L) 3.5 - 5.0 g/dL   AST 30 15 - 41 U/L   ALT 15 14 -  54 U/L    Comment: REPEATED TO VERIFY   Alkaline Phosphatase 128 (H) 38 - 126 U/L   Total Bilirubin 0.7 0.3 - 1.2 mg/dL   GFR calc non Af Amer 35 (L) >60 mL/min   GFR calc Af Amer 41 (L) >60 mL/min    Comment: (NOTE) The eGFR has been calculated using the CKD EPI equation. This calculation has not been validated in all clinical situations. eGFR's persistently <60 mL/min signify possible Chronic Kidney Disease.    Anion gap 23 (H) 5 - 15    Comment: REPEATED TO VERIFY  Magnesium     Status: Abnormal   Collection Time: 01/15/17  5:30 PM  Result Value Ref Range   Magnesium 1.2 (L) 1.7 - 2.4 mg/dL  CBG monitoring, ED     Status: Abnormal   Collection Time: 01/15/17  5:33 PM  Result Value Ref Range   Glucose-Capillary 156 (H) 65 - 99 mg/dL   Comment 1 Notify RN   I-stat troponin, ED (not at [facility], [facility])     Status: None   Collection Time: 01/15/17  5:36 PM  Result Value Ref Range   Troponin i, poc 0.01 0.00 - 0.08 ng/mL   Comment 3            Comment: Due to the release kinetics of cTnI, a negative result within the first hours of the onset of symptoms does not rule out myocardial infarction with certainty. If myocardial infarction is still suspected, repeat the test at  appropriate intervals.   I-Stat Chem 8, ED  (not at [facility], [facility])     Status: Abnormal   Collection Time: 01/15/17  5:38 PM  Result Value Ref Range   Sodium 135 135 - 145 mmol/L   Potassium 2.9 (L) 3.5 - 5.1 mmol/L   Chloride 87 (L) 101 - 111 mmol/L   BUN 12 6 - 20 mg/dL   Creatinine, Ser 1.40 (H) 0.44 - 1.00 mg/dL   Glucose, Bld 161 (H) 65 - 99 mg/dL   Calcium, Ion 1.00 (L) 1.15 - 1.40 mmol/L   TCO2 35 0 - 100 mmol/L   Hemoglobin 12.9 12.0 - 15.0 g/dL   HCT 38.0 36.0 - 46.0 %   Ct Head Wo Contrast  Result Date: 01/15/2017 CLINICAL DATA:  Per EMS, pt from [facility] [place] [facility], staff reports pt reported she is not able to see. Did not report this to the Paramedic, however, she would not open her eyes and states  that she does not feel good. EXAM: CT HEAD WITHOUT CONTRAST TECHNIQUE: Contiguous axial images were obtained from the base of the skull through the vertex without intravenous contrast. COMPARISON:  12/12/2016 FINDINGS: Brain: Progressive hypoattenuation posteriorly in both occipital lobes, left greater than right. Subacute nonhemorrhagic left cerebellar infarct. No acute intracranial hemorrhage. 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 Patchy areas of hypoattenuation in deep and periventricular white matter bilaterally. No midline shift. No extra-axial mass. No hydrocephalus. Vascular: No hyperdense vessel or unexpected calcification. Skull: Normal. Negative for fracture or focal lesion. Sinuses/Orbits: No acute finding. Other: None. IMPRESSION: 1. Progressive parenchymal hypoattenuation in left cerebellum and bilateral posterior occipital lobes. 2. No hemorrhage or other acute finding. Electronically Signed   By: Lucrezia Europe M.D.   On: 01/15/2017 18:24   Dg Chest Port 1 View  Result Date: 01/15/2017 CLINICAL DATA:  visual changes. Seems like she has AMS. Pt started seizing and drooling while EMT was putting patient on bedpain. Pt eyes deviated to right. Pt able to move freely but making clonic movements with her feet. Hx asthma, COPD, HTN, current smoker EXAM: PORTABLE CHEST - 1 VIEW COMPARISON:  01/08/2017 FINDINGS: Cavitary lesion in the left mid lung laterally as before. Left lateral pleural thickening or loculated effusion. No new infiltrate. Heart size upper limits normal. No pneumothorax. Visualized bones unremarkable. IMPRESSION: Stable findings in the left hemithorax. No acute or superimposed abnormality. Electronically Signed   By: Lucrezia Europe M.D.   On: 01/15/2017 18:19    Assessment/Plan 50 year old lady presenting with acute visual changes and altered mental status as well as acutely elevated blood pressure and seizure activity. 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 Ischemic stroke only to be ruled out as well, however.  Recommendations: 1. MRI of the brain as planned without contrast 2. EEG, routine adult study 3. Continue Keppra at 1000 mg every 12 hours IV 4. BP management per admitting primary care team  We will continue to follow this patient with you.  C.R. Nicole Kindred, MD [facility] [phone]  01/15/2017, 10:30 PM

## 2017-01-15 NOTE — ED Notes (Signed)
Patient transported to CT 

## 2017-01-15 NOTE — ED Provider Notes (Signed)
[facility] DEPT Provider Note   CSN: [id number] Arrival date & time: 01/15/17  1653     History   Chief Complaint Chief Complaint  Patient presents with  . Visual Field Change  . Seizures     HPI    Blood pressure (!) 192/121, pulse 86, temperature 97.7 F (36.5 C), temperature source Oral, resp. rate 18, SpO2 96 %.  [name] is a 51 y.o. female BIBEMS for visual changes from [facility] rehabilitation facility onset at about 2 PM. She doesn't normally live there but was there for rehabilitation status post strep pneumonia and empyema with VATS and encephalopathy. As per EMS the facility staff states she was mentating at her baseline, intermittently responsive to questioning. Moving all extremities, could not describe her visual changes. Upon coming into the ED she had a tonic-clonic seizure. Level V caveat secondary to altered mental status. No known history of seizure disorder: She is taking Lamictal for psychiatric issues per chart review.  Discussed with charge nurse at [facility] patient's daughter came in at approximately 4 PM and mother was complaining that she could not see she was also complaining of a headache, M.D. evaluated her and her pupils were equal round and reactive (as per charge RN) and was instructed to present to the ED for possible CVA. On further questioning it turns out that she mentioned to her speech therapy as she was having some visual issues at about 3 PM that she could grasp the cup and there was no gross visual changes at that time. Medication was given for breakfast and charge nurse also administered this patient's food and she did not complain of anything in the a.m.  Past Medical History:  Diagnosis Date  . Allergic rhinitis   . Arthritis   . Asthma   . Back pain   . Bipolar 1 disorder ([place])   . Chronic low back pain 03/06/2014  . COPD (chronic obstructive pulmonary disease) ([place])   . Depression   . Fibroid   . Hypertension   . IBS  (irritable bowel syndrome)   . Migraine   . Scoliosis     Patient Active Problem List   Diagnosis Date Noted  . H/O chest tube placement   . Candida infection   . Coagulase-negative staphylococcal infection   . Asthma 01/03/2017  . Seasonal allergies 01/03/2017  . Normocytic anemia 01/03/2017  . Herpes labialis 01/03/2017  . Empyema of pleural space ([place]) 12/26/2016  . Abscess of lower lobe of left lung with pneumonia ([place])   . Ileus ([place])   . Pneumothorax   . Pneumonia of left upper lobe due to Streptococcus pneumoniae ([place])   . Clostridium difficile colitis   . Acute kidney injury ([place])   . HCAP (healthcare-associated pneumonia)   . Transaminitis   . Pressure injury of skin 12/19/2016  . 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 Acute respiratory failure ([place]) 12/13/2015  . IBS (irritable bowel syndrome) 12/13/2015  . COPD with acute exacerbation ([place])   . Essential hypertension   . Encephalopathy acute 12/03/2015  . Chronic narcotic dependence ([place]) 11/22/2015  . Hypercalcemia 11/22/2015  . Chronic hyponatremia 03/07/2015  . Cigarette nicotine dependence without complication [id number]  . Fibromyalgia syndrome 01/09/2015  . Mixed hyperlipidemia 01/09/2015  . Chronic low back pain 03/06/2014    Past Surgical History:  Procedure Laterality Date  . CHOLECYSTECTOMY    . DILATION AND CURETTAGE OF UTERUS    . EXPLORATORY  LAPAROTOMY     WHEN PATIENT WAS YOUNG  . RIGHT FOOT SURGERY    . TUBAL LIGATION Bilateral   . VIDEO ASSISTED THORACOSCOPY (VATS)/DECORTICATION Left 12/26/2016   Procedure: LEFT VIDEO ASSISTED THORACOSCOPY (VATS)/DECORTICATION;  Surgeon: Ivin Poot, MD;  Location: [facility];  Service: Thoracic;  Laterality: Left;  Marland Kitchen VIDEO BRONCHOSCOPY N/A 12/26/2016   Procedure: VIDEO BRONCHOSCOPY WITH BRONCHIAL LAVAGE;  Surgeon: Ivin Poot, MD;  Location: [facility] OR;  Service: Thoracic;  Laterality: N/A;    OB History     No data available       Home Medications    Prior to Admission medications   Medication Sig Start Date End Date Taking? Authorizing Provider  budesonide (PULMICORT) 0.5 MG/2ML nebulizer solution Take 2 mLs (0.5 mg total) by nebulization 2 (two) times daily. 01/12/17   Theodis Blaze, MD  fluconazole (DIFLUCAN) 200 MG tablet Take 1 tablet (200 mg total) by mouth daily. 01/13/17   Theodis Blaze, MD  hydrALAZINE (APRESOLINE) 25 MG tablet Take 1 tablet (25 mg total) by mouth every 8 (eight) hours. 01/12/17   Theodis Blaze, MD  HYDROcodone-homatropine (HYCODAN) 5-1.5 MG/5ML syrup Take 5 mLs by mouth every 4 (four) hours as needed for cough. 01/12/17   Theodis Blaze, MD  ipratropium-albuterol (DUONEB) 0.5-2.5 (3) MG/3ML SOLN Take 3 mLs by nebulization every 6 (six) hours as needed. 01/12/17   Theodis Blaze, MD  linezolid (ZYVOX) 600 MG tablet Take 1 tablet (600 mg total) by mouth every 12 (twelve) hours. 01/12/17   Theodis Blaze, MD  metoprolol tartrate (LOPRESSOR) 25 MG tablet Take 25 mg by mouth 2 (two) times daily. Reported on 02/21/2016    Historical Provider, MD  oxyCODONE (OXY IR/ROXICODONE) 5 MG immediate release tablet Take 1 tablet (5 mg total) by mouth every 6 (six) hours as needed for moderate pain. 01/12/17   Theodis Blaze, MD  pantoprazole (PROTONIX) 40 MG tablet Take 1 tablet (40 mg total) by mouth daily at 12 noon. 01/12/17   Theodis Blaze, MD  QUEtiapine (SEROQUEL) 100 MG tablet Take 1 tablet (100 mg total) by mouth at bedtime. 01/12/17   Theodis Blaze, MD  vancomycin (VANCOCIN) 50 mg/mL oral solution Take 5 mLs (250 mg total) by mouth every 6 (six) hours. Stop date 01/22/2017 after the last dose 01/12/17 01/22/17  Theodis Blaze, MD    Family History Family History  Problem Relation Age of Onset  . Stroke Mother   . Hypertension Brother   . Hypertension Brother   . Hypertension Brother   . Allergic rhinitis Neg Hx   . Angioedema Neg Hx   . Asthma Neg Hx   . Atopy Neg Hx   . Eczema Neg Hx    . Immunodeficiency Neg Hx   . Urticaria Neg Hx     Social History Social History  Substance Use Topics  . Smoking status: Current Every Day Smoker    Packs/day: 0.50    Types: Cigarettes  . Smokeless tobacco: Never Used  . Alcohol use No     Allergies   Cymbalta [duloxetine hcl]; Mucinex [guaifenesin er]; Nyquil multi-symptom [pseudoeph-doxylamine-dm-apap]; Paxil [paroxetine hcl]; Promethazine; Tramadol; Wellbutrin [bupropion]; and Zoloft [sertraline]   Review of Systems Review of Systems   Physical Exam Updated Vital Signs BP (!) 192/121 (BP Location: Left Arm)   Pulse 86   Temp 97.7 F (36.5 C) (Oral)   Resp 18   SpO2 96%   Physical Exam  Constitutional: She appears well-developed and well-nourished. No distress.  HENT:  Head: Normocephalic and atraumatic.  Mouth/Throat: Oropharynx is clear and moist.  Eyes: Conjunctivae and EOM are normal. Pupils are equal, round, and reactive to light.  Neck: Normal range of motion.  Cardiovascular: Normal rate, regular rhythm and intact distal pulses.   Pulmonary/Chest: Effort normal and breath sounds normal.  Abdominal: Soft. There is no tenderness.  Musculoskeletal: Normal range of motion.  Neurological:  Tonic-clonic seizures, bilateral eyes deviated inferiorly and to the right  Incontinent to urine (as per EMS this was before seizure started)  Moving all extremities, downgoing plantar reflexes bilaterally  Skin: She is not diaphoretic.  Psychiatric: She has a normal mood and affect.  Nursing note and vitals reviewed.    ED Treatments / Results  Labs (all labs ordered are listed, but only abnormal results are displayed) Labs Reviewed  CBC - Abnormal; Notable for the following:       Result Value   WBC 14.8 (*)    Hemoglobin 11.5 (*)    HCT 35.9 (*)    Platelets 504 (*)    All other components within normal limits  DIFFERENTIAL - Abnormal; Notable for the following:    Neutro Abs 11.6 (*)    Monocytes  Absolute 1.4 (*)    All other components within normal limits  COMPREHENSIVE METABOLIC PANEL - Abnormal; Notable for the following:    Potassium 2.9 (*)    Chloride 86 (*)    Glucose, Bld 156 (*)    Creatinine, Ser 1.65 (*)    Albumin 3.3 (*)    Alkaline Phosphatase 128 (*)    GFR calc non Af Amer 35 (*)    GFR calc Af Amer 41 (*)    Anion gap 23 (*)    All other components within normal limits  RAPID URINE DRUG SCREEN, HOSP PERFORMED - Abnormal; Notable for the following:    Opiates POSITIVE (*)    All other components within normal limits  URINALYSIS, ROUTINE W REFLEX MICROSCOPIC - Abnormal; Notable for the following:    Color, Urine STRAW (*)    All other components within normal limits  I-STAT CHEM 8, ED - Abnormal; Notable for the following:    Potassium 2.9 (*)    Chloride 87 (*)    Creatinine, Ser 1.40 (*)    Glucose, Bld 161 (*)    Calcium, Ion 1.00 (*)    All other components within normal limits  CBG MONITORING, ED - Abnormal; Notable for the following:    Glucose-Capillary 156 (*)    All other components within normal limits  ETHANOL  PROTIME-INR  APTT  MAGNESIUM  I-STAT TROPOININ, ED    EKG  EKG Interpretation None       Radiology No results found.  Procedures Procedures (including critical care time)  CRITICAL CARE Performed by: Monico Blitz   Total critical care time: 35 minutes  Critical care time was exclusive of separately billable procedures and treating other patients.  Critical care was necessary to treat or prevent imminent or life-threatening deterioration.  Critical care was time spent personally by me on the following activities: development of treatment plan with patient and/or surrogate as well as nursing, discussions with consultants, evaluation of patient's response to treatment, examination of patient, obtaining history from patient or surrogate, ordering and performing treatments and interventions, ordering and review of  laboratory studies, ordering and review of radiographic studies, pulse oximetry and re-evaluation of patient's condition.   Medications  Ordered in ED Medications - No data to display   Initial Impression / Assessment and Plan / ED Course  I have reviewed the triage vital signs and the nursing notes.  Pertinent labs & imaging results that were available during my care of the patient were reviewed by me and considered in my medical decision making (see chart for details).     Vitals:   01/15/17 1706 01/15/17 1707  BP: (!) 198/126 (!) 192/121  Pulse: 86   Resp: 18   Temp: 97.7 F (36.5 C)   TempSrc: Oral   SpO2: 96%     Medications - No data to display  [name] is 51 y.o. female presenting with Visual changes onset at 2 PM, she had a seizure upon presenting to the emergency department. This was tonic-clonic, responded well to 2 mg of Ativan.  5:38 PM discussed with Dr. Shon Hale as patient is technically in the code stroke window however with the vague history and in perfect timeline he would recommend not calling a code stroke at this time.  Head CT negative, mild hypokalemia, normal CG, blood work otherwise unremarkable, she is UDS positive for narcotics which she has a prescription for. Patient has not had another seizure since being in the emergency department however she remains confused, neurologic exam is grossly nonfocal. Discussed with neurology Dr. Shon Hale who agrees with transferring her care: For further workup including MRI and EEG. Case discussed with triad hospitalist Dr. Ninfa Meeker who accepts admission and will arrange for transfer  Final Clinical Impressions(s) / ED Diagnoses   Final diagnoses:  None    New Prescriptions New Prescriptions   No medications on file     Monico Blitz, PA-C 01/15/17 [place], MD 01/16/17 [phone]

## 2017-01-15 NOTE — ED Notes (Signed)
Pt will not attempt to drink even while placing fluids to mouth.  Will page MD about possibility of IV potassium

## 2017-01-15 NOTE — ED Notes (Signed)
Transfer from [facility] via [place], pt was initially going to [facility], neuro wants to see and re-eval for step down bed. Pt has seized again en route from [facility] to [facility]. Pt receieved 2mg  Ativan with [facility].

## 2017-01-15 NOTE — ED Notes (Signed)
[facility] here to receive patient  

## 2017-01-15 NOTE — ED Notes (Signed)
Made daughter aware of patient transfer   [name] [phone]

## 2017-01-15 NOTE — ED Provider Notes (Signed)
Pt being admitted for seizures. She was being transferred to [facility] from [facility], and en route had a seizure. EMS gave patient 2 mg of ativan - which stopped the seizure. Pt post ictal. EMS stopped by ED to ensure it was safe to proceed to the floor bed.  Spoke with Dr. Nicole Kindred, Neurology. He recommended adding 1 gram keppra and to admit to stepdown. Dr. Maudie Mercury has been made aware, and he will change the bed request.  Pt is on a NRB right now - and O2 sats are normal. She likely has some resp depression from the ativan.    Varney Biles, MD 01/15/17 2204

## 2017-01-15 NOTE — H&P (Addendum)
TRH H&P   Patient Demographics:    [name], is a 51 y.o. female  MRN: [id number]   DOB - [birth year]  Admit Date - 01/15/2017  Outpatient Primary MD for the patient is [name], MD  Referring MD/NP/PA: Elmyra Ricks [place]  Outpatient Specialists: Tharon Aquas Tright, (CT surgery), Praveen Mannam (pulmonary)  Patient coming from: [facility]  Chief Complaint  Patient presents with  . Visual Field Change  . Seizures      HPI:    [name]  is a 51 y.o. female, w hx of Asthma/Copd, Cavitary lesion left lung, Vats 12/26/2016, apparently was at [facility] , brought to ED for ? Vision changes.  Pt has baseline confusion? Per staff.    In ED, pt had brief 60 sec, tonic clonic seizure with postictal state.  Pt tx with ativan 3m iv x1 with relief.  Pt appears to be oriented x2 (not time).  ED consulted neurology and decided against code stroke.  CT brain  01/15/2017 => subactue nonhemorrhagic left cerebellar infarct, no acute intracranial hemorrhage, old left frontal infarct with encephalomalacia.  MRI brain 12/14/2016=> small cluster of acute infarctions in the left cerebellum, no mass effect or hemorrhage. 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  Ekg ST at 105, nl axis, poor R progression.  CXR showed stable left cavitary area.  Pt appears more alert at this time.  Pt able to move all 4 ext.  Pt noted to be hypokalemic with K=2.9 pt will be admitted for seizure and r/o CVA.    Review of systems:    In addition to the HPI above,  No Fever-chills, Slight  Headache, >1 day pt vague about this.  , No problems swallowing food or Liquids, No Chest pain, Cough or Shortness of Breath, No Abdominal pain, No Nausea or Vommitting, Bowel movements are regular, No Blood in stool or Urine, No dysuria, No new skin rashes or bruises, No new joints  pains-aches,  No new weakness, tingling, numbness in any extremity, No recent weight gain or loss, No polyuria, polydypsia or polyphagia, No significant Mental Stressors.  A full 10 point Review of Systems was done, except as stated above, all other Review of Systems were negative.   With Past History of the following :    Past Medical History:  Diagnosis Date  . Allergic rhinitis   . Arthritis   . Asthma   . Back pain   . Bipolar 1 disorder ([place]   . Chronic low back pain 03/06/2014  . COPD (chronic obstructive pulmonary disease) ([place]   . Depression   . Fibroid   . Hypertension   . IBS (irritable bowel syndrome)   . Migraine   . Scoliosis       Past Surgical History:  Procedure Laterality Date  . CHOLECYSTECTOMY    . DILATION AND CURETTAGE  OF UTERUS    . EXPLORATORY LAPAROTOMY     WHEN PATIENT WAS YOUNG  . RIGHT FOOT SURGERY    . TUBAL LIGATION Bilateral   . VIDEO ASSISTED THORACOSCOPY (VATS)/DECORTICATION Left 12/26/2016   Procedure: LEFT VIDEO ASSISTED THORACOSCOPY (VATS)/DECORTICATION;  Surgeon: Ivin Poot, MD;  Location: [place];  Service: Thoracic;  Laterality: Left;  Marland Kitchen VIDEO BRONCHOSCOPY N/A 12/26/2016   Procedure: VIDEO BRONCHOSCOPY WITH BRONCHIAL LAVAGE;  Surgeon: Ivin Poot, MD;  Location[facility] OR;  Service: Thoracic;  Laterality: N/A;      Social History:     Social History  Substance Use Topics  . Smoking status: Current Every Day Smoker    Packs/day: 0.50    Types: Cigarettes  . Smokeless tobacco: Never Used  . Alcohol use No     Lives - at SNF presently  Mobility - typically walks without assistance   Family History :     Family History  Problem Relation Age of Onset  . Stroke Mother   . Hypertension Brother   . Hypertension Brother   . Hypertension Brother   . Allergic rhinitis Neg Hx   . Angioedema Neg Hx   . Asthma Neg Hx   . Atopy Neg Hx   . Eczema Neg Hx   . Immunodeficiency Neg Hx   . Urticaria Neg Hx       Home  Medications:   Prior to Admission medications   Medication Sig Start Date End Date Taking? Authorizing Provider  budesonide (PULMICORT) 0.5 MG/2ML nebulizer solution Take 2 mLs (0.5 mg total) by nebulization 2 (two) times daily. 01/12/17   Theodis Blaze, MD  fluconazole (DIFLUCAN) 200 MG tablet Take 1 tablet (200 mg total) by mouth daily. 01/13/17   Theodis Blaze, MD  hydrALAZINE (APRESOLINE) 25 MG tablet Take 1 tablet (25 mg total) by mouth every 8 (eight) hours. 01/12/17   Theodis Blaze, MD  HYDROcodone-homatropine (HYCODAN) 5-1.5 MG/5ML syrup Take 5 mLs by mouth every 4 (four) hours as needed for cough. 01/12/17   Theodis Blaze, MD  ipratropium-albuterol (DUONEB) 0.5-2.5 (3) MG/3ML SOLN Take 3 mLs by nebulization every 6 (six) hours as needed. 01/12/17   Theodis Blaze, MD  linezolid (ZYVOX) 600 MG tablet Take 1 tablet (600 mg total) by mouth every 12 (twelve) hours. 01/12/17   Theodis Blaze, MD  metoprolol tartrate (LOPRESSOR) 25 MG tablet Take 25 mg by mouth 2 (two) times daily. Reported on 02/21/2016    Historical Provider, MD  oxyCODONE (OXY IR/ROXICODONE) 5 MG immediate release tablet Take 1 tablet (5 mg total) by mouth every 6 (six) hours as needed for moderate pain. 01/12/17   Theodis Blaze, MD  pantoprazole (PROTONIX) 40 MG tablet Take 1 tablet (40 mg total) by mouth daily at 12 noon. 01/12/17   Theodis Blaze, MD  QUEtiapine (SEROQUEL) 100 MG tablet Take 1 tablet (100 mg total) by mouth at bedtime. 01/12/17   Theodis Blaze, MD  vancomycin (VANCOCIN) 50 mg/mL oral solution Take 5 mLs (250 mg total) by mouth every 6 (six) hours. Stop date 01/22/2017 after the last dose 01/12/17 01/22/17  Theodis Blaze, MD     Allergies:     Allergies  Allergen Reactions  . Cymbalta [Duloxetine Hcl] Other (See Comments)    Mood changes...mean  . Mucinex [Guaifenesin Er] Other (See Comments)    Possible allergy, per patient (??)  . Nyquil Multi-Symptom [Pseudoeph-Doxylamine-Dm-Apap] Hives  . Paxil [Paroxetine  Hcl]  Other (See Comments)    Mean and aggresive  . Promethazine Itching  . Tramadol Itching  . Wellbutrin [Bupropion] Other (See Comments)    Rapid heartrate  . Zoloft [Sertraline] Other (See Comments)    Anger     Physical Exam:   Vitals  Blood pressure (!) 181/104, pulse 110, temperature 97.7 F (36.5 C), temperature source Oral, resp. rate 17, SpO2 92 %.   1. General  lying in bed in NAD,    2. Normal affect and insight, Not Suicidal or Homicidal, Awake Alert, Oriented X 3.  3. No F.N deficits, ALL C.Nerves Intact, Strength 5/5 all 4 extremities, Sensation intact all 4 extremities, Plantars down going.  4. Ears and Eyes appear Normal, Conjunctivae clear, PERRLA. Moist Oral Mucosa.  5. Supple Neck, No JVD, No cervical lymphadenopathy appriciated, No Carotid Bruits.  6. Symmetrical Chest wall movement, Good air movement bilaterally, CTAB.  7. RRR, No Gallops, Rubs or Murmurs, No Parasternal Heave.  8. Positive Bowel Sounds, Abdomen Soft, No tenderness, No organomegaly appriciated,No rebound -guarding or rigidity.  9.  No Cyanosis, Normal Skin Turgor, No Skin Rash or Bruise.  10. Good muscle tone,  joints appear normal , no effusions, Normal ROM.  11. No Palpable Lymph Nodes in Neck or Axillae     Data Review:    CBC  Recent Labs Lab 01/09/17 0351 01/10/17 0601 01/11/17 0419 01/12/17 0639 01/15/17 1730 01/15/17 1738  WBC 7.4 4.7 6.1 4.6 14.8*  --   HGB 8.0* 7.8* 8.4* 7.9* 11.5* 12.9  HCT 25.7* 25.4* 28.0* 25.5* 35.9* 38.0  PLT 254 283 332 309 504*  --   MCV 93.8 93.0 92.7 92.4 89.8  --   MCH 29.2 28.6 27.8 28.6 28.8  --   MCHC 31.1 30.7 30.0 31.0 32.0  --   RDW 15.6* 15.5 15.7* 16.1* 15.2  --   LYMPHSABS  --   --   --   --  1.7  --   MONOABS  --   --   --   --  1.4*  --   EOSABS  --   --   --   --  0.0  --   BASOSABS  --   --   --   --  0.0  --     ------------------------------------------------------------------------------------------------------------------  Chemistries   Recent Labs Lab 01/09/17 0351 01/10/17 0602 01/11/17 0419 01/12/17 0639 01/15/17 1730 01/15/17 1738  NA 138 138 139 140 139 135  K 4.4 4.2 4.3 3.9 2.9* 2.9*  CL 103 103 101 100* 86* 87*  CO2 _0 --   GLUCOSE 103* 93 89 93 156* 161*  BUN _1 CREATININE 3.81* 3.75* 3.75* 3.37* 1.65* 1.40*  CALCIUM 8.8* 9.1 9.4 8.9 9.3  --   AST  --   --   --   --  30  --   ALT  --   --   --   --  15  --   ALKPHOS  --   --   --   --  128*  --   BILITOT  --   --   --   --  0.7  --    ------------------------------------------------------------------------------------------------------------------ estimated creatinine clearance is 45.3 mL/min (by C-G formula based on SCr of 1.4 mg/dL (H)). ------------------------------------------------------------------------------------------------------------------ No results for input(s): TSH, T4TOTAL, T3FREE, THYROIDAB in the last 72 hours.  Invalid input(s): FREET3  Coagulation profile  Recent Labs Lab  01/15/17 1730  INR 1.09   ------------------------------------------------------------------------------------------------------------------- No results for input(s): DDIMER in the last 72 hours. -------------------------------------------------------------------------------------------------------------------  Cardiac Enzymes No results for input(s): CKMB, TROPONINI, MYOGLOBIN in the last 168 hours.  Invalid input(s): CK ------------------------------------------------------------------------------------------------------------------    Component Value Date/Time   BNP 571.9 (H) 12/12/2015 1640     ---------------------------------------------------------------------------------------------------------------  Urinalysis    Component Value Date/Time   COLORURINE STRAW (A) 01/15/2017  1719   APPEARANCEUR CLEAR 01/15/2017 1719   LABSPEC 1.008 01/15/2017 1719   PHURINE 8.0 01/15/2017 1719   GLUCOSEU NEGATIVE 01/15/2017 1719   HGBUR NEGATIVE 01/15/2017 1719   BILIRUBINUR NEGATIVE 01/15/2017 1719   KETONESUR NEGATIVE 01/15/2017 1719   PROTEINUR NEGATIVE 01/15/2017 1719   UROBILINOGEN 1.0 08/20/2014 1110   NITRITE NEGATIVE 01/15/2017 1719   LEUKOCYTESUR NEGATIVE 01/15/2017 1719    ----------------------------------------------------------------------------------------------------------------   Imaging Results:    Ct Head Wo Contrast  Result Date: 01/15/2017 CLINICAL DATA:  Per EMS, pt from [place] home, staff reports pt reported she is not able to see. Did not report this to the Paramedic, however, she would not open her eyes and states that she does not feel good. EXAM: CT HEAD WITHOUT CONTRAST TECHNIQUE: Contiguous axial images were obtained from the base of the skull through the vertex without intravenous contrast. COMPARISON:  12/12/2016 FINDINGS: Brain: Progressive hypoattenuation posteriorly in both occipital lobes, left greater than right. Subacute nonhemorrhagic left cerebellar infarct. No acute intracranial hemorrhage. 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 Patchy areas of hypoattenuation in deep and periventricular white matter bilaterally. No midline shift. No extra-axial mass. No hydrocephalus. Vascular: No hyperdense vessel or unexpected calcification. Skull: Normal. Negative for fracture or focal lesion. Sinuses/Orbits: No acute finding. Other: None. IMPRESSION: 1. Progressive parenchymal hypoattenuation in left cerebellum and bilateral posterior occipital lobes. 2. No hemorrhage or other acute finding. Electronically Signed   By: Lucrezia Europe M.D.   On: 01/15/2017 18:24   Dg Chest Port 1 View  Result Date: 01/15/2017 CLINICAL DATA:  visual changes. Seems like she has AMS. Pt started seizing and drooling while EMT was putting patient on bedpain. Pt  eyes deviated to right. Pt able to move freely but making clonic movements with her feet. Hx asthma, COPD, HTN, current smoker EXAM: PORTABLE CHEST - 1 VIEW COMPARISON:  01/08/2017 FINDINGS: Cavitary lesion in the left mid lung laterally as before. Left lateral pleural thickening or loculated effusion. No new infiltrate. Heart size upper limits normal. No pneumothorax. Visualized bones unremarkable. IMPRESSION: Stable findings in the left hemithorax. No acute or superimposed abnormality. Electronically Signed   By: Lucrezia Europe M.D.   On: 01/15/2017 18:19    Assessment & Plan:    Principal Problem:   Seizure [facility]) Active Problems:   Essential hypertension   Anemia   Hypokalemia   Renal insufficiency   Hyperglycemia    1. Seizure MRI/ MRA brain ordered Check EEG B12, folate, esr, ana, rpr, tsh Appreciate neurology input (consulted by ED to see at [facility]) Keppra 1053m iv bid Ativan 261miv q4h prn  2. CVA Check lipid Cont aspirin, start simvastatin 4060mo qhs  3. Hypokalemia Replete Check magnesium, cmp in am  4. Renal insufficiency Hydrate with ns iv  5. Hypertension Cont metoprolol, cont hydralazine  6. Lung abscess Cont linezolid  7 .Bipolar Cont lamictal, cont seroquel  8. Asthma Cont pulmicort, cont duoneb  9.  Chronic back pain Cont gabapentin, cont oxycodone  10. Gerd Cont protonix  11. C. Diff diarrhea Enteric precautions  Cont vanco oral solution   DVT Prophylaxis SCDs   AM Labs Ordered, also please review Full Orders  Family Communication: Admission, patients condition and plan of care including tests being ordered have been discussed with the patient  who indicate understanding and agree with the plan and Code Status.  Code Status FULL CODE  Likely DC to  home  Condition GUARDED    Consults called: neurology by ED   Admission status: inpatient  Time spent in minutes : 45 minutes   Jani Gravel M.D on 01/15/2017 at 7:43 PM  Between 7am to  7pm - Pager - [phone]   After 7pm go to [email or web address] - password [email or web address]  [facility] - Office  [phone]   Addendum While awaiting neurology input on starting antiepileptic, pt apparently had seizure enroute to [facility],pt therefore transferred to [facility].

## 2017-01-15 NOTE — ED Triage Notes (Signed)
Pt started seizing and drooling while EMT was putting patient on bedpain.  Pt eyes deviated to right.  Pt able to move freely but making clonic movements with her feet.  Made MD aware.

## 2017-01-15 NOTE — ED Triage Notes (Signed)
Per EMS, pt from [facility] nsg home, staff reports pt reported she is not able to see.  Did not report this to the Paramedic, however, she would not open her eyes and states that she does not feel good.

## 2017-01-15 NOTE — ED Notes (Signed)
PA Elmyra Ricks wanted to speak to neurology. Neurology paged and then Dr Regenia Skeeter wanted a code stroke called, Neurology paged and did not think it was a code stroke, [facility]  On hold for code stroke till further notice, Dr Shon Hale was neuro on call.

## 2017-01-16 ENCOUNTER — Inpatient Hospital Stay (HOSPITAL_COMMUNITY): Payer: Medicaid Other

## 2017-01-16 DIAGNOSIS — I6783 Posterior reversible encephalopathy syndrome: Principal | ICD-10-CM

## 2017-01-16 DIAGNOSIS — H539 Unspecified visual disturbance: Secondary | ICD-10-CM

## 2017-01-16 DIAGNOSIS — G9340 Encephalopathy, unspecified: Secondary | ICD-10-CM

## 2017-01-16 LAB — COMPREHENSIVE METABOLIC PANEL
ALK PHOS: 113 U/L (ref 38–126)
ALT: 11 U/L — AB (ref 14–54)
AST: 20 U/L (ref 15–41)
Albumin: 2.7 g/dL — ABNORMAL LOW (ref 3.5–5.0)
Anion gap: 15 (ref 5–15)
BILIRUBIN TOTAL: 0.9 mg/dL (ref 0.3–1.2)
BUN: 9 mg/dL (ref 6–20)
CALCIUM: 8.2 mg/dL — AB (ref 8.9–10.3)
CO2: 34 mmol/L — ABNORMAL HIGH (ref 22–32)
CREATININE: 1.24 mg/dL — AB (ref 0.44–1.00)
Chloride: 90 mmol/L — ABNORMAL LOW (ref 101–111)
GFR calc Af Amer: 57 mL/min — ABNORMAL LOW (ref >60)
GFR, EST NON AFRICAN AMERICAN: 49 mL/min — AB (ref >60)
Glucose, Bld: 107 mg/dL — ABNORMAL HIGH (ref 65–99)
Potassium: 3 mmol/L — ABNORMAL LOW (ref 3.5–5.1)
Sodium: 139 mmol/L (ref 135–145)
TOTAL PROTEIN: 6.2 g/dL — AB (ref 6.5–8.1)

## 2017-01-16 LAB — CULTURE, FUNGUS WITHOUT SMEAR

## 2017-01-16 LAB — LIPID PANEL
CHOLESTEROL: 210 mg/dL — AB (ref 0–200)
HDL: 55 mg/dL (ref >40)
LDL Cholesterol: 127 mg/dL — ABNORMAL HIGH (ref 0–99)
TRIGLYCERIDES: 140 mg/dL (ref <150)
Total CHOL/HDL Ratio: 3.8 RATIO
VLDL: 28 mg/dL (ref 0–40)

## 2017-01-16 LAB — MRSA PCR SCREENING: MRSA BY PCR: NEGATIVE

## 2017-01-16 MED ORDER — METOPROLOL TARTRATE 25 MG PO TABS
25.0000 mg | ORAL_TABLET | Freq: Two times a day (BID) | ORAL | Status: DC
  Administered 2017-01-16 – 2017-01-17 (×2): 25 mg via ORAL
  Filled 2017-01-16: qty 1

## 2017-01-16 MED ORDER — BUDESONIDE 0.5 MG/2ML IN SUSP
0.5000 mg | Freq: Two times a day (BID) | RESPIRATORY_TRACT | Status: DC
  Administered 2017-01-17 – 2017-01-20 (×7): 0.5 mg via RESPIRATORY_TRACT
  Filled 2017-01-16 (×14): qty 2

## 2017-01-16 MED ORDER — OXYCODONE HCL 5 MG PO TABS: 5.0000 mg | ORAL_TABLET | Freq: Four times a day (QID) | ORAL | Status: DC | PRN

## 2017-01-16 MED ORDER — METOPROLOL TARTRATE 5 MG/5ML IV SOLN
5.0000 mg | Freq: Once | INTRAVENOUS | Status: AC
Start: 2017-01-16 — End: 2017-01-16
  Administered 2017-01-16: 5 mg via INTRAVENOUS
  Filled 2017-01-16: qty 5

## 2017-01-16 MED ORDER — LINEZOLID 600 MG PO TABS
600.0000 mg | ORAL_TABLET | Freq: Two times a day (BID) | ORAL | Status: DC
  Filled 2017-01-16: qty 1

## 2017-01-16 MED ORDER — POTASSIUM CHLORIDE 2 MEQ/ML IV SOLN
30.0000 meq | Freq: Two times a day (BID) | INTRAVENOUS | Status: AC
  Administered 2017-01-16 (×2): 30 meq via INTRAVENOUS
  Filled 2017-01-16 (×2): qty 15

## 2017-01-16 MED ORDER — ACETAMINOPHEN 325 MG PO TABS
650.0000 mg | ORAL_TABLET | ORAL | Status: DC | PRN
  Administered 2017-01-19 (×2): 650 mg via ORAL
  Filled 2017-01-16 (×2): qty 2

## 2017-01-16 MED ORDER — ACETAMINOPHEN 160 MG/5ML PO SOLN: 650.0000 mg | ORAL | Status: DC | PRN

## 2017-01-16 MED ORDER — LORAZEPAM 2 MG/ML IJ SOLN
2.0000 mg | Freq: Once | INTRAMUSCULAR | Status: AC
  Administered 2017-01-16: 2 mg via INTRAVENOUS
  Filled 2017-01-16: qty 2

## 2017-01-16 MED ORDER — HYDRALAZINE HCL 25 MG PO TABS
25.0000 mg | ORAL_TABLET | Freq: Three times a day (TID) | ORAL | Status: DC
  Administered 2017-01-16 – 2017-01-17 (×2): 25 mg via ORAL
  Filled 2017-01-16 (×2): qty 1

## 2017-01-16 MED ORDER — STROKE: EARLY STAGES OF RECOVERY BOOK
Freq: Once | Status: AC
  Administered 2017-01-16: 02:00:00
  Filled 2017-01-16: qty 1

## 2017-01-16 MED ORDER — SODIUM CHLORIDE 0.9 % IV SOLN
INTRAVENOUS | Status: AC
  Administered 2017-01-16: 02:00:00 via INTRAVENOUS

## 2017-01-16 MED ORDER — LINEZOLID 600 MG/300ML IV SOLN
600.0000 mg | Freq: Two times a day (BID) | INTRAVENOUS | Status: DC
  Administered 2017-01-16 (×2): 600 mg via INTRAVENOUS
  Filled 2017-01-16 (×2): qty 300

## 2017-01-16 MED ORDER — MAGNESIUM SULFATE 50 % IJ SOLN
3.0000 g | Freq: Once | INTRAVENOUS | Status: AC
  Administered 2017-01-16: 3 g via INTRAVENOUS
  Filled 2017-01-16: qty 6

## 2017-01-16 MED ORDER — PANTOPRAZOLE SODIUM 40 MG PO TBEC: 40.0000 mg | DELAYED_RELEASE_TABLET | Freq: Every day | ORAL | Status: DC

## 2017-01-16 MED ORDER — PANTOPRAZOLE SODIUM 40 MG IV SOLR
40.0000 mg | Freq: Every day | INTRAVENOUS | Status: DC
  Administered 2017-01-16 – 2017-01-19 (×4): 40 mg via INTRAVENOUS
  Filled 2017-01-16 (×4): qty 40

## 2017-01-16 MED ORDER — IPRATROPIUM-ALBUTEROL 0.5-2.5 (3) MG/3ML IN SOLN: 3.0000 mL | Freq: Four times a day (QID) | RESPIRATORY_TRACT | Status: DC | PRN

## 2017-01-16 MED ORDER — VANCOMYCIN 50 MG/ML ORAL SOLUTION
250.0000 mg | Freq: Four times a day (QID) | ORAL | Status: DC
  Administered 2017-01-16 – 2017-01-22 (×23): 250 mg via ORAL
  Filled 2017-01-16 (×31): qty 5

## 2017-01-16 MED ORDER — ACETAMINOPHEN 650 MG RE SUPP: 650.0000 mg | RECTAL | Status: DC | PRN

## 2017-01-16 MED ORDER — QUETIAPINE FUMARATE 50 MG PO TABS
100.0000 mg | ORAL_TABLET | Freq: Every day | ORAL | Status: DC
  Administered 2017-01-16 – 2017-01-21 (×6): 100 mg via ORAL
  Filled 2017-01-16: qty 1
  Filled 2017-01-16 (×2): qty 2
  Filled 2017-01-16 (×3): qty 1

## 2017-01-16 MED FILL — Lorazepam Inj 2 MG/ML: INTRAMUSCULAR | Qty: 1 | Status: AC

## 2017-01-16 NOTE — Progress Notes (Signed)
SLP Cancellation Note  Patient Details Name: [name] MRN: [id number] DOB: [birth year]   Cancelled treatment:       Reason Eval/Treat Not Completed: Patient's level of consciousness. After seizure activity and medication, pt not alert enough for cognitive linguistic eval at this time. Will f/u after the weekend to address cognitive linguistic function when arousal is appropriate. Note that pt was seen for safety with swallowing after prolonged intubation last admit.  Dysphagia resolved by d/c and at baseline swallowing is normal. When pt is alert, ok to start diet and order SLP for swallowing if concerns arise.    Chae Shuster, Katherene Ponto 01/16/2017, 8:40 AM

## 2017-01-16 NOTE — Progress Notes (Signed)
PROGRESS NOTE    [name]  MRN:[id number] DOB: 10/16/1966 DOA: 01/15/2017 PCP: ARVIND,MOOGALI M, MD   Brief Narrative:  [name]  is a 50 y.o. WF PMHx Bipolar 1 disorder,Depression, w hx of Asthma/Copd, Cavitary lesion left lung, Vats 12/26/2016, Chronic low back pain, apparently was at SNF  Brought to ED for ? Vision changes.  Pt has baseline confusion? Per staff.    In ED, pt had brief 60 sec, tonic clonic seizure with postictal state.  Pt tx with ativan 2mg iv x1 with relief.  Pt appears to be oriented x2 (not time).  ED consulted neurology and decided against code stroke.  CT brain  01/15/2017 => subactue nonhemorrhagic left cerebellar infarct, no acute intracranial hemorrhage, old left frontal infarct with encephalomalacia.  MRI brain 12/14/2016=> small cluster of acute infarctions in the left cerebellum, no mass effect or hemorrhage. 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  Ekg ST at 105, nl axis, poor R progression.  CXR showed stable left cavitary area.  Pt appears more alert at this time.  Pt able to move all 4 ext.  Pt noted to be hypokalemic with K=2.9 pt will be admitted for seizure and r/o CVA.    Subjective: 2/23  A/O 0. Withdraws to pain. Mumbles unintelligibly.   Assessment & Plan:   Principal Problem:   Seizure (HCC) Active Problems:   Essential hypertension   Anemia   Hypokalemia   Renal insufficiency   Hyperglycemia    Seizure MRI/ MRA brain ordered Check EEG B12, folate, esr, ana, rpr, tsh Appreciate neurology input (consulted by ED to see at [facility]) Keppra 1000mg iv bid Ativan 2mg iv q4h prn  2. CVA Check lipid Cont aspirin, start simvastatin 40mg po qhs  Hypokalemia -Potassium IV 60 mEq   Hypomagnesemia -Magnesium IV 3 g   X live- 4. Renal insufficiency Hydrate with ns iv  5. Hypertension Cont metoprolol, cont hydralazine  6. Lung abscess Cont linezolid  7 .Bipolar Cont lamictal, cont seroquel  8.  Asthma Cont pulmicort, cont duoneb  9.  Chronic back pain Cont gabapentin, cont oxycodone  10. Gerd Cont protonix  11. C. Diff diarrhea Enteric precautions Cont vanco oral solution   Goals of care -PALLIATIVE CARE; patient with multisystem organ failure discussed change of CODE STATUS to DO NOT RESUSCITATE, short-term vs long-term goals of care   DVT prophylaxis: SCD Code Status: Full Family Communication: None Disposition Plan: ?   Consultants:  Neurology     Procedures/Significant Events:  2/22 CT head without contrast: -Subacute nonhemorrhagic left cerebellar infarct -Progressive parenchymal hypoattenuation in left cerebellum and bilateral posterior occipital lobes. 2. No hemorrhage or other acute finding. 2/23 MRI Brain;. 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 2. Motion degraded MRA with multiple questioned anterior circulation stenoses versus motion artifact as above. Consider further evaluation with CTA as an outpatient once the patient's acute illness has resolved and he is better able to remain motionless. 3. Hypoplastic versus occluded distal left vertebral artery. Widely patent and dominant right vertebral artery. 4. 2 mm aneurysm versus prominent infundibulum at the left posterior communicating origin.:   VENTILATOR SETTINGS:    Cultures   Antimicrobials: Anti-infectives    Start     Stop   01/16/17 0245  linezolid (ZYVOX) IVPB 600 mg  Status:  Discontinued     02 /23/18  1326   01/16/17 0130  vancomycin (VANCOCIN) 50 mg/mL oral solution 250 mg    Comments:  Stop date 01/22/2017 after the last dose     01/22/17 2359   01/16/17 0116  linezolid (ZYVOX) tablet 600 mg  Status:  Discontinued     01/16/17 0239       Devices    LINES /  TUBES:      Continuous Infusions: . sodium chloride 75 mL/hr at 01/16/17 1100     Objective: Vitals:   01/16/17 0834 01/16/17 0900 01/16/17 1245 01/16/17 1302  BP: (!) 178/109 (!) 119/92  (!) 194/105  Pulse:      Resp: (!) 21 18 20  (!) 27  Temp: 100.1 F (37.8 C)   98.2 F (36.8 C)  TempSrc: Axillary  Oral Axillary  SpO2: 99% 99%  92%  Weight:        Intake/Output Summary (Last 24 hours) at 01/16/17 1418 Last data filed at 01/16/17 1100  Gross per 24 hour  Intake           1712.5 ml  Output                0 ml  Net           1712.5 ml   Filed Weights   01/16/17 0100  Weight: 73 kg (160 lb 15 oz)    Examination:  General: A/O 0. Withdraws to pain. Mumbles unintelligibly, No acute respiratory distress Eyes: negative scleral hemorrhage, negative anisocoria, negative icterus ENT: Negative Runny nose, negative gingival bleeding, Neck:  Negative scars, masses, torticollis, lymphadenopathy, JVD Lungs: diffuse expiratory wheezing Lt>>Rt Cardiovascular: Tachycardic,Regular rhythm without murmur gallop or rub normal S1 and S2 Abdomen: negative abdominal pain, nondistended, positive soft, bowel sounds, no rebound, no ascites, no appreciable mass Extremities: No significant cyanosis, clubbing, or edema bilateral lower extremities Skin: Negative rashes, lesions, ulcers Psychiatric:  Central nervous system:   .     Data Reviewed: Care during the described time interval was provided by me .  I have reviewed this patient's available data, including medical history, events of note, physical examination, and all test results as part of my evaluation. I have personally reviewed and interpreted all radiology studies.  CBC:  Recent Labs Lab 01/10/17 0601 01/11/17 0419 01/12/17 0639 01/15/17 1730 01/15/17 1738  WBC 4.7 6.1 4.6 14.8*  --   NEUTROABS  --   --   --  11.6*  --   HGB 7.8* 8.4* 7.9* 11.5* 12.9  HCT 25.4* 28.0* 25.5* 35.9* 38.0  MCV 93.0 92.7 92.4 89.8  --    PLT 283 332 309 504*  --    Basic Metabolic Panel:  Recent Labs Lab 01/10/17 0602 01/11/17 0419 01/12/17 0639 01/15/17 1730 01/15/17 1738 01/16/17 0701  NA 138 139 140 139 135 139  K 4.2 4.3 3.9 2.9* 2.9* 3.0*  CL 103 101 100* 86* 87* 90*  CO2 27 28 30 30   --  34*  GLUCOSE 93 89 93 156* 161* 107*  BUN 13 13 13 13 12 9   CREATININE 3.75* 3.75* 3.37* 1.65* 1.40* 1.24*  CALCIUM 9.1 9.4 8.9 9.3  --  8.2*  MG  --   --   --  1.2*  --   --   PHOS 6.7*  --   --   --   --   --    GFR: Estimated Creatinine Clearance: 49.7 mL/min (by C-G formula based on SCr of 1.24 mg/dL (H)). Liver Function  Tests:  Recent Labs Lab 01/10/17 0602 01/15/17 1730 01/16/17 0701  AST  --  30 20  ALT  --  15 11*  ALKPHOS  --  128* 113  BILITOT  --  0.7 0.9  PROT  --  7.6 6.2*  ALBUMIN 2.0* 3.3* 2.7*   No results for input(s): LIPASE, AMYLASE in the last 168 hours. No results for input(s): AMMONIA in the last 168 hours. Coagulation Profile:  Recent Labs Lab 01/15/17 1730  INR 1.09   Cardiac Enzymes: No results for input(s): CKTOTAL, CKMB, CKMBINDEX, TROPONINI in the last 168 hours. BNP (last 3 results) No results for input(s): PROBNP in the last 8760 hours. HbA1C: No results for input(s): HGBA1C in the last 72 hours. CBG:  Recent Labs Lab 01/15/17 1733  GLUCAP 156*   Lipid Profile:  Recent Labs  01/16/17 0701  CHOL 210*  HDL 55  LDLCALC 127*  TRIG 140  CHOLHDL 3.8   Thyroid Function Tests: No results for input(s): TSH, T4TOTAL, FREET4, T3FREE, THYROIDAB in the last 72 hours. Anemia Panel: No results for input(s): VITAMINB12, FOLATE, FERRITIN, TIBC, IRON, RETICCTPCT in the last 72 hours. Urine analysis:    Component Value Date/Time   COLORURINE STRAW (A) 01/15/2017 1719   APPEARANCEUR CLEAR 01/15/2017 1719   LABSPEC 1.008 01/15/2017 1719   PHURINE 8.0 01/15/2017 1719   GLUCOSEU NEGATIVE 01/15/2017 1719   HGBUR NEGATIVE 01/15/2017 1719   BILIRUBINUR NEGATIVE  01/15/2017 1719   KETONESUR NEGATIVE 01/15/2017 1719   PROTEINUR NEGATIVE 01/15/2017 1719   UROBILINOGEN 1.0 08/20/2014 1110   NITRITE NEGATIVE 01/15/2017 1719   LEUKOCYTESUR NEGATIVE 01/15/2017 1719   Sepsis Labs: @LABRCNTIP (procalcitonin:4,lacticidven:4)  ) Recent Results (from the past 240 hour(s))  MRSA PCR Screening     Status: None   Collection Time: 01/16/17  1:13 AM  Result Value Ref Range Status   MRSA by PCR NEGATIVE NEGATIVE Final    Comment:        The GeneXpert MRSA Assay (FDA approved for NASAL specimens only), is one component of a comprehensive MRSA colonization surveillance program. It is not intended to diagnose MRSA infection nor to guide or monitor treatment for MRSA infections.          Radiology Studies: Ct Head Wo Contrast  Result Date: 01/15/2017 CLINICAL DATA:  Per EMS, pt from [facility] nsg home, staff reports pt reported she is not able to see. Did not report this to the Paramedic, however, she would not open her eyes and states that she does not feel good. EXAM: CT HEAD WITHOUT CONTRAST TECHNIQUE: Contiguous axial images were obtained from the base of the skull through the vertex without intravenous contrast. COMPARISON:  12/12/2016 FINDINGS: Brain: Progressive hypoattenuation posteriorly in both occipital lobes, left greater than right. Subacute nonhemorrhagic left cerebellar infarct. No acute intracranial hemorrhage. 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 Patchy areas of hypoattenuation in deep and periventricular white matter bilaterally. No midline shift. No extra-axial mass. No hydrocephalus. Vascular: No hyperdense vessel or unexpected calcification. Skull: Normal. Negative for fracture or focal lesion. Sinuses/Orbits: No acute finding. Other: None. IMPRESSION: 1. Progressive parenchymal hypoattenuation in left cerebellum and bilateral posterior occipital lobes. 2. No hemorrhage or other acute finding. Electronically Signed   By: Lucrezia Europe M.D.   On: 01/15/2017 18:24   Mr Brain Wo Contrast  Result Date: 01/16/2017 CLINICAL DATA:  New onset visual changes and seizure activity. Elevated blood pressure (215/127). EXAM: MRI HEAD WITHOUT CONTRAST MRA HEAD WITHOUT CONTRAST TECHNIQUE: Multiplanar,  multiecho pulse sequences of the brain and surrounding structures were obtained without intravenous contrast. Angiographic images of the head were obtained using MRA technique without contrast. COMPARISON:  Head CT 01/15/2017 and MRI 12/14/2016 FINDINGS: MRI HEAD FINDINGS Multiple sequences are moderately motion degraded. Brain: A subcentimeter focus of abnormal diffusion signal is again seen in the left cerebellum, however there is new masslike confluent T2 hyperintensity in the left greater than right cerebellar hemispheres without significant posterior fossa mass effect. The there are also multiple new regions of relatively extensive, patchy and confluent cortical and subcortical T2 hyperintensity in both cerebral hemispheres, greatest in the occipital and posterior temporal lobes bilaterally as well as left parietal and high left frontal lobes. There is milder involvement of the right parietal and right frontal lobes. Diffusion is largely facilitated in these regions, however there are areas of predominantly cortically based restricted diffusion most notable in the occipital lobes. No definite intracranial hemorrhage is identified. There is no midline shift or extra-axial fluid collection. Ventricles are unchanged in size. Encephalomalacia is again noted the anteroinferiorly in the left frontal lobe and may reflect remote trauma or prior ischemic infarction. Vascular: Non-visualization of the distal left vertebral artery, unchanged. Skull and upper cervical spine: Unremarkable bone marrow signal. Sinuses/Orbits: No gross acute abnormality. Other: None. MRA HEAD FINDINGS The study is moderately motion degraded. The visualized distal right vertebral  artery is dominant and widely patent, supplying the basilar. A left vertebral artery is not identified and may be occluded or hypoplastic. The basilar artery is patent without evidence of significant stenosis. Left AICA and bilateral SCAs are visualized. There is a small posterior communicating artery on the left. PCAs are patent without evidence of significant proximal stenosis. The internal carotid arteries are patent from skullbase to carotid termini without evidence of flow limiting stenosis. A 2 x 2 mm outpouching is present at the left posterior communicating origin. ACAs and MCAs are patent. The left A1 segment is dominant. A potentially high-grade stenosis is questioned at the right ACA origin with potential moderate stenosis more distally in the right A1 segment, however this appearance may be at least partly due to motion artifact. There may be a moderate distal left M1 stenosis, and proximal M2 stenoses are questioned bilaterally. IMPRESSION: 1. 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 2. Motion degraded MRA with multiple questioned anterior circulation stenoses versus motion artifact as above. Consider further evaluation with CTA as an outpatient once the patient's acute illness has resolved and he is better able to remain motionless. 3. Hypoplastic versus occluded distal left vertebral artery. Widely patent and dominant right vertebral artery. 4. 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 Electronically Signed   By: Logan Bores M.D.   On: 01/16/2017 13:28   Dg Chest Port 1 View  Result Date: 01/15/2017 CLINICAL DATA:  visual changes. Seems like she has AMS. Pt started seizing and drooling while EMT was putting patient on bedpain. Pt eyes deviated to right.  Pt able to move freely but making clonic movements with her feet. Hx asthma, COPD, HTN, current smoker EXAM: PORTABLE CHEST - 1 VIEW COMPARISON:  01/08/2017 FINDINGS: Cavitary lesion in the left mid lung laterally as before. Left lateral pleural thickening or loculated effusion. No new infiltrate. Heart size upper limits normal. No pneumothorax. Visualized bones unremarkable. IMPRESSION: Stable findings  in the left hemithorax. No acute or superimposed abnormality. Electronically Signed   By: Lucrezia Europe M.D.   On: 01/15/2017 18:19   Mr Jodene Nam Head/brain WF Cm  Result Date: 01/16/2017 CLINICAL DATA:  New onset visual changes and seizure activity. Elevated blood pressure (215/127). EXAM: MRI HEAD WITHOUT CONTRAST MRA HEAD WITHOUT CONTRAST TECHNIQUE: Multiplanar, multiecho pulse sequences of the brain and surrounding structures were obtained without intravenous contrast. Angiographic images of the head were obtained using MRA technique without contrast. COMPARISON:  Head CT 01/15/2017 and MRI 12/14/2016 FINDINGS: MRI HEAD FINDINGS Multiple sequences are moderately motion degraded. Brain: A subcentimeter focus of abnormal diffusion signal is again seen in the left cerebellum, however there is new masslike confluent T2 hyperintensity in the left greater than right cerebellar hemispheres without significant posterior fossa mass effect. The there are also multiple new regions of relatively extensive, patchy and confluent cortical and subcortical T2 hyperintensity in both cerebral hemispheres, greatest in the occipital and posterior temporal lobes bilaterally as well as left parietal and high left frontal lobes. There is milder involvement of the right parietal and right frontal lobes. Diffusion is largely facilitated in these regions, however there are areas of predominantly cortically based restricted diffusion most notable in the occipital lobes. No definite intracranial hemorrhage is identified. There is no midline  shift or extra-axial fluid collection. Ventricles are unchanged in size. Encephalomalacia is again noted the anteroinferiorly in the left frontal lobe and may reflect remote trauma or prior ischemic infarction. Vascular: Non-visualization of the distal left vertebral artery, unchanged. Skull and upper cervical spine: Unremarkable bone marrow signal. Sinuses/Orbits: No gross acute abnormality. Other: None. MRA HEAD FINDINGS The study is moderately motion degraded. The visualized distal right vertebral artery is dominant and widely patent, supplying the basilar. A left vertebral artery is not identified and may be occluded or hypoplastic. The basilar artery is patent without evidence of significant stenosis. Left AICA and bilateral SCAs are visualized. There is a small posterior communicating artery on the left. PCAs are patent without evidence of significant proximal stenosis. The internal carotid arteries are patent from skullbase to carotid termini without evidence of flow limiting stenosis. A 2 x 2 mm outpouching is present at the left posterior communicating origin. ACAs and MCAs are patent. The left A1 segment is dominant. A potentially high-grade stenosis is questioned at the right ACA origin with potential moderate stenosis more distally in the right A1 segment, however this appearance may be at least partly due to motion artifact. There may be a moderate distal left M1 stenosis, and proximal M2 stenoses are questioned bilaterally. IMPRESSION: 1. 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 2. Motion degraded MRA with multiple questioned anterior circulation stenoses versus motion artifact as above. Consider further evaluation with CTA as an outpatient once the patient's acute illness has resolved and he is better  able to remain motionless. 3. Hypoplastic versus occluded distal left vertebral artery. Widely patent and dominant right vertebral artery. 4. 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 Electronically Signed   By: Logan Bores M.D.   On: 01/16/2017 13:28        Scheduled Meds: . budesonide  0.5 mg Nebulization BID  . hydrALAZINE  25 mg Oral Q8H  . levETIRAcetam  1,000 mg Intravenous Q12H  . metoprolol tartrate  25 mg Oral  BID  . pantoprazole (PROTONIX) IV  40 mg Intravenous Daily  . QUEtiapine  100 mg Oral QHS  . vancomycin  250 mg Oral Q6H   Continuous Infusions: . sodium chloride 75 mL/hr at 01/16/17 1100     LOS: 1 day    Time spent: 40 minutes     Yamira Papa, Geraldo Docker, MD [facility] Pager [phone]   If 7PM-7AM, please contact night-coverage [email or web address] Password [email or web address] 01/16/2017, 2:18 PM

## 2017-01-16 NOTE — Care Management Note (Signed)
Case Management Note  Patient Details  Name: [name] MRN: [id number] Date of Birth: 1966-06-10  Subjective/Objective:   Pt lives with daughter and grandson, was discharged on 2/19 to [facility] for rehab, readmitted with seizures.                           Expected Discharge Plan:  Skilled Nursing Facility  In-House Referral:  Clinical Social Work  Discharge planning Services  CM Consult  Status of Service:  In process, will continue to follow  Girard Cooter, RN 01/16/2017, 11:12 AM

## 2017-01-16 NOTE — Progress Notes (Signed)
Subjective: Not following commands or answering questions appropriately. BP elevated in the 190s.   Objective: Current vital signs: BP (!) 178/109   Pulse (!) 112   Temp 100.1 F (37.8 C) (Axillary)   Resp (!) 21   SpO2 99%  Vital signs in last 24 hours: Temp:  [97.7 F (36.5 C)-100.1 F (37.8 C)] 100.1 F (37.8 C) (02/23 0834) Pulse Rate:  [86-121] 112 (02/23 0509) Resp:  [13-25] 21 (02/23 0834) BP: (149-215)/(85-127) 178/109 (02/23 0834) SpO2:  [92 %-99 %] 99 % (02/23 0834)  Intake/Output from previous day: 02/22 0701 - 02/23 0700 In: 1000 [IV Piggyback:1000] Out: -  Intake/Output this shift: No intake/output data recorded. Nutritional status: Diet NPO time specified  ROS:                                                                                                                                       History obtained from unobtainable from patient due to mental status   Neurologic Exam: General: NAD Mental Status: Lethargic, not following commands Cranial Nerves: II:  PERRL. Unable to assess visual fields  III,IV, VI: Unable to assess  V,VII: Unable to assess  VIII: Unable to assess  IX,X: Unable to assess, protecting airway XI: Unable to assess  XII: Unable to assess   Motor: Unable to assess motor strength as not following commands  Tone and bulk:normal tone throughout; no atrophy noted Sensory: Responds to painful stimuli Deep Tendon Reflexes: 3+ in lower extremities. 2+ in upper extremities  Plantars: Right: downgoing   Left: downgoing Cerebellar: Unable to assess  Gait: Unable to assess   Lab Results: Basic Metabolic Panel:  Recent Labs Lab 01/10/17 0602 01/11/17 0419 01/12/17 0639 01/15/17 1730 01/15/17 1738 01/16/17 0701  NA 138 139 140 139 135 139  K 4.2 4.3 3.9 2.9* 2.9* 3.0*  CL 103 101 100* 86* 87* 90*  CO2 27 28 30 30   --  34*  GLUCOSE 93 89 93 156* 161* 107*  BUN 13 13 13 13 12 9   CREATININE 3.75* 3.75* 3.37* 1.65* 1.40*  1.24*  CALCIUM 9.1 9.4 8.9 9.3  --  8.2*  MG  --   --   --  1.2*  --   --   PHOS 6.7*  --   --   --   --   --     Liver Function Tests:  Recent Labs Lab 01/10/17 0602 01/15/17 1730 01/16/17 0701  AST  --  30 20  ALT  --  15 11*  ALKPHOS  --  128* 113  BILITOT  --  0.7 0.9  PROT  --  7.6 6.2*  ALBUMIN 2.0* 3.3* 2.7*   CBC:  Recent Labs Lab 01/10/17 0601 01/11/17 0419 01/12/17 0639 01/15/17 1730 01/15/17 1738  WBC 4.7 6.1 4.6 14.8*  --   NEUTROABS  --   --   --  11.6*  --  HGB 7.8* 8.4* 7.9* 11.5* 12.9  HCT 25.4* 28.0* 25.5* 35.9* 38.0  MCV 93.0 92.7 92.4 89.8  --   PLT 283 332 309 504*  --     Lipid Panel:  Recent Labs Lab 01/16/17 0701  CHOL 210*  TRIG 140  HDL 55  CHOLHDL 3.8  VLDL 28  LDLCALC 127*    CBG:  Recent Labs Lab 01/15/17 1733  GLUCAP 156*    Microbiology: Results for orders placed or performed during the hospital encounter of 01/15/17  MRSA PCR Screening     Status: None   Collection Time: 01/16/17  1:13 AM  Result Value Ref Range Status   MRSA by PCR NEGATIVE NEGATIVE Final    Comment:        The GeneXpert MRSA Assay (FDA approved for NASAL specimens only), is one component of a comprehensive MRSA colonization surveillance program. It is not intended to diagnose MRSA infection nor to guide or monitor treatment for MRSA infections.     Coagulation Studies:  Recent Labs  01/15/17 1730  LABPROT 14.1  INR 1.09    Imaging: Ct Head Wo Contrast  Result Date: 01/15/2017 CLINICAL DATA:  Per EMS, pt from [place] home, staff reports pt reported she is not able to see. Did not report this to the Paramedic, however, she would not open her eyes and states that she does not feel good. EXAM: CT HEAD WITHOUT CONTRAST TECHNIQUE: Contiguous axial images were obtained from the base of the skull through the vertex without intravenous contrast. COMPARISON:  12/12/2016 FINDINGS: Brain: Progressive hypoattenuation posteriorly in  both occipital lobes, left greater than right. Subacute nonhemorrhagic left cerebellar infarct. No acute intracranial hemorrhage. 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 Patchy areas of hypoattenuation in deep and periventricular white matter bilaterally. No midline shift. No extra-axial mass. No hydrocephalus. Vascular: No hyperdense vessel or unexpected calcification. Skull: Normal. Negative for fracture or focal lesion. Sinuses/Orbits: No acute finding. Other: None. IMPRESSION: 1. Progressive parenchymal hypoattenuation in left cerebellum and bilateral posterior occipital lobes. 2. No hemorrhage or other acute finding. Electronically Signed   By: Lucrezia Europe M.D.   On: 01/15/2017 18:24   Dg Chest Port 1 View  Result Date: 01/15/2017 CLINICAL DATA:  visual changes. Seems like she has AMS. Pt started seizing and drooling while EMT was putting patient on bedpain. Pt eyes deviated to right. Pt able to move freely but making clonic movements with her feet. Hx asthma, COPD, HTN, current smoker EXAM: PORTABLE CHEST - 1 VIEW COMPARISON:  01/08/2017 FINDINGS: Cavitary lesion in the left mid lung laterally as before. Left lateral pleural thickening or loculated effusion. No new infiltrate. Heart size upper limits normal. No pneumothorax. Visualized bones unremarkable. IMPRESSION: Stable findings in the left hemithorax. No acute or superimposed abnormality. Electronically Signed   By: Lucrezia Europe M.D.   On: 01/15/2017 18:19    Medications: I have reviewed the patient's current medications.  Assessment/Plan:  Acute Encephalopathy: Multifactorial. Currently being treated for lung abscess with Linezolid s/p VATS and C diff infection with PO Vancomycin. Her WBC count has increased from 4.6 to 14.8 and she had a mild fever of 100.1 this morning. CXR on 2/22 stable from before. Additionally she had new onset seizures on admission which could be contributing. Per pharmacy, Linezolid has been associated with visual  changes and seizures. Per discharge summary, last day for Linezolid is 01/15/17. Would stop this now. She is recovering from acute renal failure as well. TSH pending.  MRI pending. CT head findings concerning for PRES. Suspect a lot of her encephalopathy is from metabolic causes. We will continue to follow her exam daily.   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 BPs still elevated in the 170s-190s. She is currently on Hydralazine 25 mg Q6H scheduled and Hydralazine 10 mg IV Q6H PRN. Would add on Amlodipine 10 mg daily as this will help gradually drop BP over next 24 hours. Want to avoid drastic BP changes.  New Onset Seizures: Witnessed in ED. Currently on Keppra. Would favor switching to Depakote given her hx of bipolar disorder as Keppra can cause agitation and psychosis.   Hx Bipolar Disorder: On Seroquel at home. Would continue this and switch to Depakote for seizures as above.   Attending note to follow  Albin Felling, MD, MPH Internal Medicine Resident, PGY-III Pager: [phone] 01/16/2017, 9:39 AM

## 2017-01-16 NOTE — Progress Notes (Signed)
[facility] Consult received for feeding tube placement. Feeding tube placed in L nare, at 92 cm. RN to order abdominal x-ray to confirm placement of tip of tube. Contact [facility] team via Amion if feeding tube needs to be replaced. Stylet has been saved and taped at patient's head of bed.   Scarlette Ar RD, LDN, CSP Inpatient Clinical Dietitian Pager: [phone] After Hours Pager: [phone]

## 2017-01-16 NOTE — Progress Notes (Signed)
On Linezolid after recent discharge for MRSE / strep pneumo lung abscess. Treatment suppose to stop on 2/22 per discharge not on 2/19. Came in with blurred vision and seizures on 2/22.   FYI - Linezolid does have a list of postmarketing adverse effects which include visual changes and seizures although these are rare (< 1%)  Consider need to continue Linezolid therapy or holding as possible culprit of seizures / visual changes.  Thanks, Elenor Quinones, PharmD, BCPS Clinical Pharmacist Pager [phone] 01/16/2017 10:23 AM

## 2017-01-16 NOTE — Evaluation (Signed)
Physical Therapy Evaluation Patient Details Name: [name] MRN: [id number] DOB: 08-Jul-1966 Today's Date: 01/16/2017   History of Present Illness  Pt is a 51 y/o female admitted from [facility] and [place] secondary to visual changes and seizures. PMH including but not limited to bipolar, COPD, HTN and VATS (2018).   Clinical Impression  Pt presented supine in bed, asleep with eyes closed throughout session. No family or caregiver present throughout session to provide any information regarding pt's PLOF. Pt with expressive communication difficulties and unable to respond appropriately to therapist. Pt currently requires mod A x2 for bed mobility and transfers. Pt would continue to benefit from skilled physical therapy services at this time while admitted and after d/c to address her below listed limitations in order to improve her overall safety and independence with functional mobility.    Of note, pt's resting BP was 164/115 with BP after session at 155/90. Pt's resting HR at 119 and increased to 146 with activity.     Follow Up Recommendations SNF;Supervision/Assistance - 24 hour    Equipment Recommendations  None recommended by PT    Recommendations for Other Services       Precautions / Restrictions Precautions Precautions: Fall;Other (comment) (seizure precautions) Restrictions Weight Bearing Restrictions: No      Mobility  Bed Mobility Overal bed mobility: Needs Assistance Bed Mobility: Supine to Sit;Sit to Supine     Supine to sit: Mod assist;+2 for safety/equipment Sit to supine: Mod assist;+2 for safety/equipment   General bed mobility comments: increased time, max encouragement with verbal and tactile cueing, mod A to elevate trunk to achieve sitting EOB  Transfers Overall transfer level: Needs assistance Equipment used: 2 person hand held assist Transfers: Sit to/from Stand Sit to Stand: Mod assist;+2 physical assistance;+2 safety/equipment          General transfer comment: increased time, max encouragement and mod A x2 to rise into standing from bed. Pt performed sit-to-stand x2 from bed  Ambulation/Gait                Stairs            Wheelchair Mobility    Modified Rankin (Stroke Patients Only)       Balance Overall balance assessment: Needs assistance Sitting-balance support: Feet supported;Bilateral upper extremity supported Sitting balance-Leahy Scale: Poor Sitting balance - Comments: pt fluctuating between mod A to min guard to maintain sitting EOB; however, this was due more to resistance and pt wanting to lie back down Postural control: Left lateral lean Standing balance support: Bilateral upper extremity supported Standing balance-Leahy Scale: Poor Standing balance comment: pt reliant on external supports and mod A                              Pertinent Vitals/Pain Pain Assessment: Faces Faces Pain Scale: Hurts a little bit    Home Living Family/patient expects to be discharged to:: Skilled nursing facility                      Prior Function Level of Independence: Needs assistance         Comments: pt from SNF but no family or caregiver to provide information regarding pt's PLOF     Hand Dominance        Extremity/Trunk Assessment   Upper Extremity Assessment Upper Extremity Assessment: Defer to OT evaluation    Lower Extremity Assessment Lower Extremity  Assessment: Generalized weakness       Communication   Communication: Expressive difficulties  Cognition Arousal/Alertness: Lethargic Behavior During Therapy: Flat affect Overall Cognitive Status: Impaired/Different from baseline Area of Impairment: Following commands;Safety/judgement;Awareness;Orientation;Attention Orientation Level: Disoriented to;Situation;Person;Place;Time     Following Commands: Follows one step commands with increased time;Follows one step commands  inconsistently Safety/Judgement: Decreased awareness of safety;Decreased awareness of deficits Awareness:  (pre-intellectual)        General Comments      Exercises     Assessment/Plan    PT Assessment Patient needs continued PT services  PT Problem List Decreased strength;Decreased activity tolerance;Decreased balance;Decreased mobility;Decreased coordination;Decreased cognition;Decreased knowledge of use of DME;Decreased safety awareness;Decreased knowledge of precautions;Cardiopulmonary status limiting activity       PT Treatment Interventions DME instruction;Gait training;Functional mobility training;Therapeutic activities;Therapeutic exercise;Balance training;Neuromuscular re-education;Cognitive remediation;Patient/family education    PT Goals (Current goals can be found in the Care Plan section)  Acute Rehab PT Goals Patient Stated Goal: none stated, pt with garbled speech throughout PT Goal Formulation: Patient unable to participate in goal setting Time For Goal Achievement: 01/30/17 Potential to Achieve Goals: Fair    Frequency Min 3X/week   Barriers to discharge        Co-evaluation PT/OT/SLP Co-Evaluation/Treatment: Yes Reason for Co-Treatment: For patient/therapist safety;Complexity of the patient's impairments (multi-system involvement);Necessary to address cognition/behavior during functional activity PT goals addressed during session: Mobility/safety with mobility;Balance         End of Session   Activity Tolerance: Patient tolerated treatment well Patient left: in bed;with call bell/phone within reach;with bed alarm set;Other (comment) (seizure precaution bed pads replaced) Nurse Communication: Mobility status PT Visit Diagnosis: Other abnormalities of gait and mobility (R26.89);Difficulty in walking, not elsewhere classified (R26.2)         Time: [id number] PT Time Calculation (min) (ACUTE ONLY): 34 min   Charges:   PT Evaluation $PT Eval  Moderate Complexity: 1 Procedure     PT G CodesClearnce Sorrel Kanchan Gal 01/16/2017, 3:12 PM Sherie Don, PT, DPT [phone]

## 2017-01-16 NOTE — Evaluation (Signed)
Occupational Therapy Evaluation Patient Details Name: [name] MRN: [id number] DOB: December 20, 1965 Today's Date: 01/16/2017    History of Present Illness Pt is a 51 y/o female admitted from [facility] and [place] secondary to visual changes and seizures. PMH including but not limited to bipolar, COPD, HTN and VATS (2018).    Clinical Impression   Pt admitted with above. She demonstrates the below listed deficits and will benefit from continued OT to maximize safety and independence with BADLs.  Pt presents to OT with limited participation due to decreased arousal.  She keeps eyes close throughout eval.  She requires mod A +2 for bed moblity and functional transfers and total A for ADLs.  She follows one step commands inconsistently with max cues.   Communication deficits make it difficult to determine her baseline status as well as cognition.   She will need continued SNF level rehab.  Will follow acutely.   resting BP was 164/115 with BP after session at 155/90. Pt's resting HR at 119 and increased to 146 with activity.     Follow Up Recommendations  SNF    Equipment Recommendations  None recommended by OT    Recommendations for Other Services       Precautions / Restrictions Precautions Precautions: Fall;Other (comment) (seizure precautions) Restrictions Weight Bearing Restrictions: No      Mobility Bed Mobility Overal bed mobility: Needs Assistance Bed Mobility: Supine to Sit;Sit to Supine     Supine to sit: Mod assist;+2 for safety/equipment Sit to supine: Mod assist;+2 for safety/equipment   General bed mobility comments: increased time, max encouragement with verbal and tactile cueing, mod A to elevate trunk to achieve sitting EOB  Transfers Overall transfer level: Needs assistance Equipment used: 2 person hand held assist Transfers: Sit to/from Stand Sit to Stand: Mod assist;+2 physical assistance;+2 safety/equipment         General transfer  comment: increased time, max encouragement and mod A x2 to rise into standing from bed. Pt performed sit-to-stand x2 from bed    Balance Overall balance assessment: Needs assistance Sitting-balance support: Feet supported;Bilateral upper extremity supported Sitting balance-Leahy Scale: Poor Sitting balance - Comments: pt fluctuating between mod A to min guard to maintain sitting EOB; however, this was due more to resistance and pt wanting to lie back down Postural control: Left lateral lean Standing balance support: Bilateral upper extremity supported Standing balance-Leahy Scale: Poor Standing balance comment: pt reliant on external supports and mod A                             ADL Overall ADL's : Needs assistance/impaired     Grooming: Wash/dry hands;Wash/dry face;Oral care;Brushing hair;Total assistance;Sitting   Upper Body Bathing: Total assistance;Sitting   Lower Body Bathing: Total assistance;Sit to/from stand   Upper Body Dressing : Total assistance;Sitting   Lower Body Dressing: Total assistance;Sit to/from stand   Toilet Transfer: Total assistance   Toileting- Clothing Manipulation and Hygiene: Total assistance;Sit to/from stand Toileting - Clothing Manipulation Details (indicate cue type and reason): Pt incontinent of urine.  Assisted with peri care.  Requires max prompting to stay standing as she frequently attempts to sit and return to supine      Functional mobility during ADLs: Moderate assistance;+2 for physical assistance General ADL Comments: Pt would not engage in ADL tasks      Vision   Additional Comments: Pt keeps eyes closed  Perception Perception Perception Tested?: No Comments: unable to assess due to limited participation    [place] tested?: Not tested Praxis-Other Comments: unable to accurately assess due to limited participation     Pertinent Vitals/Pain Pain Assessment: Faces Faces Pain Scale: Hurts a little  bit Pain Location: generalized/grimmacing  Pain Descriptors / Indicators: Grimacing Pain Intervention(s): Monitored during session     Hand Dominance Right   Extremity/Trunk Assessment Upper Extremity Assessment Upper Extremity Assessment: Generalized weakness (Pt moving bil. UEs spontaneously )   Lower Extremity Assessment Lower Extremity Assessment: Defer to PT evaluation       Communication Communication Communication: Expressive difficulties   Cognition Arousal/Alertness: Lethargic Behavior During Therapy: Flat affect Overall Cognitive Status: Difficult to assess Area of Impairment: Following commands;Attention Orientation Level: Disoriented to;Situation;Person;Place;Time Current Attention Level: Focused   Following Commands: Follows one step commands with increased time;Follows one step commands inconsistently Safety/Judgement: Decreased awareness of safety;Decreased awareness of deficits Awareness:  (pre-intellectual)   General Comments: Pt keeps eyes closed.  She follows one step commands intermittently with max cues.     General Comments       Exercises       Shoulder Instructions      Home Living Family/patient expects to be discharged to:: Skilled nursing facility                                 Additional Comments: Pt admitted from SNF where she was for rehab       Prior Functioning/Environment Level of Independence: Needs assistance        Comments: pt from SNF but no family or caregiver to provide information regarding pt's PLOF        OT Problem List: Decreased strength;Decreased activity tolerance;Impaired balance (sitting and/or standing);Impaired vision/perception;Decreased cognition;Decreased safety awareness;Decreased knowledge of use of DME or AE      OT Treatment/Interventions: Self-care/ADL training;Therapeutic exercise;Neuromuscular education;DME and/or AE instruction;Therapeutic activities;Cognitive  remediation/compensation;Visual/perceptual remediation/compensation;Patient/family education;Balance training    OT Goals(Current goals can be found in the care plan section) Acute Rehab OT Goals Patient Stated Goal: none stated, pt with garbled speech throughout OT Goal Formulation: Patient unable to participate in goal setting Time For Goal Achievement: 01/30/17 Potential to Achieve Goals: Fair ADL Goals Pt Will Perform Eating: with min assist;sitting Pt Will Perform Grooming: with mod assist;sitting;standing Pt Will Perform Upper Body Bathing: with mod assist;sitting Pt Will Transfer to Toilet: with min assist;stand pivot transfer;bedside commode Additional ADL Goal #1: Pt will sustain attention to familiar task with min cues x 4 mins  Additional ADL Goal #2: Pt will follow one step commands consistently   OT Frequency: Min 2X/week   Barriers to D/C: Decreased caregiver support          Co-evaluation PT/OT/SLP Co-Evaluation/Treatment: Yes Reason for Co-Treatment: Complexity of the patient's impairments (multi-system involvement);For patient/therapist safety PT goals addressed during session: Mobility/safety with mobility;Balance OT goals addressed during session: ADL's and self-care      End of Session Nurse Communication: Mobility status  Activity Tolerance: Patient limited by lethargy Patient left: in bed;with call bell/phone within reach;with bed alarm set;Other (comment) (seizure pads in place )  OT Visit Diagnosis: Unsteadiness on feet (R26.81)                ADL either performed or assessed with clinical judgement  Time: 1325-1349 OT Time Calculation (min): 24 min Charges:  OT General Charges $OT Visit: 1  Procedure OT Evaluation $OT Eval Moderate Complexity: 1 Procedure G-Codes:     Lucille Passy, OTR/L [id number]   Lucille Passy M 01/16/2017, 3:39 PM

## 2017-01-17 ENCOUNTER — Inpatient Hospital Stay (HOSPITAL_COMMUNITY): Payer: Medicaid Other

## 2017-01-17 DIAGNOSIS — G894 Chronic pain syndrome: Secondary | ICD-10-CM

## 2017-01-17 DIAGNOSIS — F319 Bipolar disorder, unspecified: Secondary | ICD-10-CM

## 2017-01-17 DIAGNOSIS — G9340 Encephalopathy, unspecified: Secondary | ICD-10-CM

## 2017-01-17 DIAGNOSIS — N179 Acute kidney failure, unspecified: Secondary | ICD-10-CM

## 2017-01-17 DIAGNOSIS — J851 Abscess of lung with pneumonia: Secondary | ICD-10-CM

## 2017-01-17 DIAGNOSIS — Z7189 Other specified counseling: Secondary | ICD-10-CM

## 2017-01-17 DIAGNOSIS — Z515 Encounter for palliative care: Secondary | ICD-10-CM

## 2017-01-17 LAB — BASIC METABOLIC PANEL
ANION GAP: 18 — AB (ref 5–15)
BUN: 10 mg/dL (ref 6–20)
CHLORIDE: 90 mmol/L — AB (ref 101–111)
CO2: 30 mmol/L (ref 22–32)
Calcium: 8.7 mg/dL — ABNORMAL LOW (ref 8.9–10.3)
Creatinine, Ser: 1.18 mg/dL — ABNORMAL HIGH (ref 0.44–1.00)
GFR calc non Af Amer: 52 mL/min — ABNORMAL LOW (ref >60)
Glucose, Bld: 118 mg/dL — ABNORMAL HIGH (ref 65–99)
Potassium: 2.9 mmol/L — ABNORMAL LOW (ref 3.5–5.1)
SODIUM: 138 mmol/L (ref 135–145)

## 2017-01-17 LAB — CBC
HEMATOCRIT: 34 % — AB (ref 36.0–46.0)
Hemoglobin: 11.1 g/dL — ABNORMAL LOW (ref 12.0–15.0)
MCH: 28.3 pg (ref 26.0–34.0)
MCHC: 32.6 g/dL (ref 30.0–36.0)
MCV: 86.7 fL (ref 78.0–100.0)
PLATELETS: 360 10*3/uL (ref 150–400)
RBC: 3.92 MIL/uL (ref 3.87–5.11)
RDW: 15.5 % (ref 11.5–15.5)
WBC: 12.2 10*3/uL — AB (ref 4.0–10.5)

## 2017-01-17 LAB — URINE CULTURE: Culture: NO GROWTH

## 2017-01-17 LAB — MAGNESIUM: MAGNESIUM: 1.8 mg/dL (ref 1.7–2.4)

## 2017-01-17 LAB — GLUCOSE, CAPILLARY
GLUCOSE-CAPILLARY: 119 mg/dL — AB (ref 65–99)
Glucose-Capillary: 107 mg/dL — ABNORMAL HIGH (ref 65–99)

## 2017-01-17 LAB — HEMOGLOBIN A1C
HEMOGLOBIN A1C: 5.4 % (ref 4.8–5.6)
Mean Plasma Glucose: 108 mg/dL

## 2017-01-17 MED ORDER — METOPROLOL TARTRATE 50 MG PO TABS
50.0000 mg | ORAL_TABLET | Freq: Two times a day (BID) | ORAL | Status: DC
  Administered 2017-01-17 – 2017-01-19 (×4): 50 mg via ORAL
  Filled 2017-01-17 (×5): qty 1

## 2017-01-17 MED ORDER — SODIUM CHLORIDE 0.9 % IV SOLN
INTRAVENOUS | Status: DC
  Administered 2017-01-17 – 2017-01-19 (×4): via INTRAVENOUS

## 2017-01-17 MED ORDER — HYDRALAZINE HCL 20 MG/ML IJ SOLN
10.0000 mg | Freq: Four times a day (QID) | INTRAMUSCULAR | Status: DC | PRN
  Administered 2017-01-19 – 2017-01-20 (×2): 10 mg via INTRAVENOUS
  Filled 2017-01-17 (×2): qty 1

## 2017-01-17 MED ORDER — POTASSIUM CHLORIDE 20 MEQ/15ML (10%) PO SOLN
ORAL | Status: AC
  Filled 2017-01-17: qty 60

## 2017-01-17 MED ORDER — POTASSIUM CHLORIDE 20 MEQ PO PACK
60.0000 meq | PACK | Freq: Two times a day (BID) | ORAL | Status: DC
  Administered 2017-01-17 – 2017-01-19 (×5): 60 meq
  Filled 2017-01-17 (×6): qty 3

## 2017-01-17 MED ORDER — SIMVASTATIN 40 MG PO TABS
40.0000 mg | ORAL_TABLET | Freq: Every day | ORAL | Status: DC
  Administered 2017-01-18: 40 mg
  Filled 2017-01-17: qty 1

## 2017-01-17 MED ORDER — HYDRALAZINE HCL 25 MG PO TABS
35.0000 mg | ORAL_TABLET | Freq: Four times a day (QID) | ORAL | Status: DC
Start: 2017-01-17 — End: 2017-01-17
  Administered 2017-01-17: 18:00:00 35 mg via ORAL
  Filled 2017-01-17: qty 1

## 2017-01-17 MED ORDER — JEVITY 1.2 CAL PO LIQD: 1000.0000 mL | ORAL | Status: DC

## 2017-01-17 MED ORDER — VITAL AF 1.2 CAL PO LIQD
1000.0000 mL | ORAL | Status: DC
  Administered 2017-01-17 – 2017-01-18 (×2): 1000 mL
  Filled 2017-01-17 (×5): qty 1000

## 2017-01-17 MED ORDER — MAGNESIUM SULFATE 2 GM/50ML IV SOLN
2.0000 g | Freq: Once | INTRAVENOUS | Status: AC
  Administered 2017-01-17: 2 g via INTRAVENOUS
  Filled 2017-01-17: qty 50

## 2017-01-17 MED ORDER — HYDRALAZINE HCL 20 MG/ML IJ SOLN
15.0000 mg | Freq: Once | INTRAMUSCULAR | Status: AC
  Administered 2017-01-17: 15 mg via INTRAVENOUS
  Filled 2017-01-17: qty 1

## 2017-01-17 MED ORDER — HYDRALAZINE HCL 50 MG PO TABS
50.0000 mg | ORAL_TABLET | Freq: Four times a day (QID) | ORAL | Status: DC
  Administered 2017-01-18 – 2017-01-19 (×8): 50 mg via ORAL
  Filled 2017-01-17 (×8): qty 1

## 2017-01-17 NOTE — Progress Notes (Signed)
Neurology Progress Note  Subjective: She remains encephalopathic. She will open her eyes when I speak to her but does not participate with the exam. She is nonverbal. No significant overnight events reported.   Current Meds:   Current Facility-Administered Medications:  .  0.9 %  sodium chloride infusion, , Intravenous, Continuous, Allie Bossier, MD, Last Rate: 75 mL/hr at 01/17/17 0825 .  acetaminophen (TYLENOL) tablet 650 mg, 650 mg, Oral, Q4H PRN **OR** acetaminophen (TYLENOL) solution 650 mg, 650 mg, Per Tube, Q4H PRN **OR** acetaminophen (TYLENOL) suppository 650 mg, 650 mg, Rectal, Q4H PRN, Jani Gravel, MD .  budesonide (PULMICORT) nebulizer solution 0.5 mg, 0.5 mg, Nebulization, BID, Jani Gravel, MD, 0.5 mg at 01/17/17 0801 .  hydrALAZINE (APRESOLINE) injection 10 mg, 10 mg, Intravenous, Q6H PRN, Jani Gravel, MD, 10 mg at 01/17/17 0954 .  hydrALAZINE (APRESOLINE) tablet 25 mg, 25 mg, Oral, Q8H, Jani Gravel, MD, 25 mg at 01/17/17 0607 .  ipratropium-albuterol (DUONEB) 0.5-2.5 (3) MG/3ML nebulizer solution 3 mL, 3 mL, Nebulization, Q6H PRN, Jani Gravel, MD .  levETIRAcetam (KEPPRA) 1,000 mg in sodium chloride 0.9 % 100 mL IVPB, 1,000 mg, Intravenous, Q12H, Jani Gravel, MD, 1,000 mg at 01/17/17 0048 .  LORazepam (ATIVAN) injection 2 mg, 2 mg, Intravenous, Q4H PRN, Jani Gravel, MD .  metoprolol tartrate (LOPRESSOR) tablet 25 mg, 25 mg, Oral, BID, Jani Gravel, MD, 25 mg at 01/16/17 2257 .  oxyCODONE (Oxy IR/ROXICODONE) immediate release tablet 5 mg, 5 mg, Oral, Q6H PRN, Jani Gravel, MD .  pantoprazole (PROTONIX) injection 40 mg, 40 mg, Intravenous, Daily, Rhetta Mura Schorr, NP, 40 mg at 01/17/17 0954 .  QUEtiapine (SEROQUEL) tablet 100 mg, 100 mg, Oral, QHS, Jani Gravel, MD, 100 mg at 01/16/17 2258 .  vancomycin (VANCOCIN) 50 mg/mL oral solution 250 mg, 250 mg, Oral, Q6H, Jani Gravel, MD, 250 mg at 01/17/17 [id number]  Objective:  Temp:  [97.8 F (36.6 C)-100.4 F (38 C)] 100.4 F (38 C) (02/24 0730) Pulse Rate:   [85-118] 85 (02/24 0013) Resp:  [0-29] 26 (02/24 0500) BP: (144-194)/(95-127) 179/109 (02/24 0607) SpO2:  [90 %-95 %] 93 % (02/24 0801)  General: WDWN woman lying in bed. She is asleep. She will open her eyes when I call her name but she does not follow any command. She does not participate with the exam which is largely limited to observation as a result. She is nonverbal.  HEENT: Neck is supple without lymphadenopathy. She would not open her mouth for me so I could not assess her oropharynx. Sclerae are anicteric. There is no conjunctival injection.  CV: Regular, no murmur. Carotid pulses are 2+ and symmetric with no bruits. Distal pulses 2+ and symmetric.  Lungs: CTAB  Extremities: No C/C/E. Neuro: MS: As noted above. No aphasia.  CN: Pupils are equal and reactive from 3-->2 mm bilaterally. Eyes are conjugate. She resists passive eye opening with good strength. She has intact corneals. Her face is symmetric at rest and she has a symmetric grimace. The remainder of her cranial nerve exam is limited by poor cooperation.  Motor: Normal bulk, tone. She moves all four extremities with grossly normal strength. She does not participate with confrontational testing. No tremor or other abnormal movements are observed.  Sensation: She grimaces and withdraws from minimal noxious stimluation.  DTRs: 2+, symmetric. Toes are downgoing bilaterally. No pathological reflexes.  Coordination and gait: These cannot be assessed as the patient is not able to participate with the exam.   Labs: Lab Results  Component Value Date   WBC 12.2 (H) 01/17/2017   HGB 11.1 (L) 01/17/2017   HCT 34.0 (L) 01/17/2017   PLT 360 01/17/2017   GLUCOSE 118 (H) 01/17/2017   CHOL 210 (H) 01/16/2017   TRIG 140 01/16/2017   HDL 55 01/16/2017   LDLCALC 127 (H) 01/16/2017   ALT 11 (L) 01/16/2017   AST 20 01/16/2017   NA 138 01/17/2017   K 2.9 (L) 01/17/2017   CL 90 (L) 01/17/2017   CREATININE 1.18 (H) 01/17/2017   BUN 10  01/17/2017   CO2 30 01/17/2017   TSH 0.892 04/17/2014   INR 1.09 01/15/2017   HGBA1C 5.4 01/16/2017   CBC Latest Ref Rng & Units 01/17/2017 01/15/2017 01/15/2017  WBC 4.0 - 10.5 K/uL 12.2(H) - 14.8(H)  Hemoglobin 12.0 - 15.0 g/dL 11.1(L) 12.9 11.5(L)  Hematocrit 36.0 - 46.0 % 34.0(L) 38.0 35.9(L)  Platelets 150 - 400 K/uL 360 - 504(H)    Lab Results  Component Value Date   HGBA1C 5.4 01/16/2017   Lab Results  Component Value Date   ALT 11 (L) 01/16/2017   AST 20 01/16/2017   ALKPHOS 113 01/16/2017   BILITOT 0.9 01/16/2017   Blood cx 2/23 NGTD  Radiology:  There is no new neuroimaging for review.   A/P:   1. PRES: Continue to gradually tighten blood pressure control.  2. Seizures: These were triggered by PRES. Continue Keppra for three months. If she remains seizure-free could discontinue it at that point. Seizure precautions.   3. Acute encephalopathy: This is due to PRES. Continue to gradually tighten BP control. Avoid CNS active meds, particularly benzos, opiates, and anything with strong anticholinergic properties. Continue to optimize metabolic status as you are.   4. Visual disturbance: She initially complained of visual disturbance which would be consistent with PRES. Follow.     Tanya Coon, MD [facility]

## 2017-01-17 NOTE — Progress Notes (Signed)
Initial Nutrition Assessment  DOCUMENTATION CODES:   Not applicable  INTERVENTION:  Monitor magnesium, potassium, and phosphorus daily for at least 3 days, MD to replete as needed, as pt is at risk for refeeding syndrome given unknown recent nutrition history.  Cortrak tube now placed and confirmed. Recommend initiating Vital AF 1.2 @ 20 ml/hr. Advance by 20 ml/hr every 8 hours to goal of Vital AF 1.2 @ 60 ml/hr (1440 ml). Goal regimen provides 1728 kcal, 108 grams of protein, 1166 ml H2O daily.  NUTRITION DIAGNOSIS:   Inadequate oral intake related to inability to eat as evidenced by NPO status.  GOAL:   Patient will meet greater than or equal to 90% of their needs  MONITOR:   Labs, Weight trends, TF tolerance, I & O's  REASON FOR ASSESSMENT:   Consult Enteral/tube feeding initiation and management  ASSESSMENT:   51 year old female with PMHx of COPD, HTN, Bipolar 1 disorder, IBS, cavitary lesion left lung, s/p video assisted thoracoscopy/decortication 12/26/2016 who presents for acute onset vision changes. Patient from [facility] and [facility]. Patient admitted for seizure and to rule out CVA.    -Of note patient recently admitted 1/19-2/19 for acute respiratory failure. She was on TF 1/22-1/26, was then advanced to Dysphagia 1 diet by SLP. On 1/30 rapid response was called due to tachypnea and abdominal pain, found to have ileus with obstruction and patient was initiated on TPN.  -On enteric precautions for C.diff diarrhea. -Cortrak tube was placed 2/23.   On assessment patient was sleeping in bed. She awoke to name call but was unable to participate in assessment. RD noted patient had removed left mitt and had pulled out Cortrak tube. Per DG Abd 2/23 Cortrak tube with tip in the duodenum near the ligament of Treitz. Discussed with RN. Patient may require soft restraints.   Cortrak tube replaced. Per abdominal x-ray 2/24 tip is at the ligament of Treitz.   No family  at bedside. Attempted to call [employer] [facility] to obtain information on how patient was eating there, but RN was not familiar with patient.  Per chart review patient was 76.2 kg on 1/20 (initial RD exam last admission). Weight gain during that admission likely due to fluid changes. Patient has lost 3.2 kg (4.2% body weight) over 1 month, which is not significant for time frame.  Medications reviewed and include: pantoprazole, vancomycin, NS @ 75 ml/hr.   Labs reviewed: Potassium 2.9, Chloride 90, Creatinine 1.18, Anion gap 18. Magnesium WNL. No Phosphorus available.   Nutrition-Focused physical exam completed. Findings are no fat depletion, no muscle depletion, and no edema. Per RN assessment patient with generalized non-pitting edema.   Diet Order:  Diet NPO time specified  Skin:  Wound (see comment) (Stg II to buttocks, Unstageable to lip, MSAD buttocks)  Last BM:  01/17/2017  Height:   Ht Readings from Last 1 Encounters:  01/01/17 5' 1.5" (1.562 m)    Weight:   Wt Readings from Last 1 Encounters:  01/16/17 160 lb 15 oz (73 kg)    Ideal Body Weight:  48.9 kg  BMI:  Body mass index is 29.92 kg/m.  Estimated Nutritional Needs:   Kcal:  1550-1810 (MSJ x 1.2-1.4)  Protein:  90-105 grams (1.2-1.4 grams/kg)  Fluid:  1.8-2.2 L/day (25-30 ml/kg)  EDUCATION NEEDS:   No education needs identified at this time  Willey Blade, MS, RD, LDN Pager: [phone] After Hours Pager: [phone]

## 2017-01-17 NOTE — Progress Notes (Signed)
PROGRESS NOTE    [name]  OI:[id number] DOB: 1966/01/20 DOA: 01/15/2017 PCP: [name], MD   Brief Narrative:  Sipp  is a 51 y.o. WF PMHx Bipolar 1 disorder,Depression, w hx of Asthma/Copd, Cavitary lesion left lung, Vats 12/26/2016, Chronic low back pain, apparently was at [facility]  Brought to ED for ? Vision changes.  Pt has baseline confusion? Per staff.    In ED, pt had brief 60 sec, tonic clonic seizure with postictal state.  Pt tx with ativan 2mg  iv x1 with relief.  Pt appears to be oriented x2 (not time).  ED consulted neurology and decided against code stroke.  CT brain  01/15/2017 => subactue nonhemorrhagic left cerebellar infarct, no acute intracranial hemorrhage, old left frontal infarct with encephalomalacia.  MRI brain 12/14/2016=> small cluster of acute infarctions in the left cerebellum, no mass effect or hemorrhage. 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  Ekg ST at 105, nl axis, poor R progression.  CXR showed stable left cavitary area.  Pt appears more alert at this time.  Pt able to move all 4 ext.  Pt noted to be hypokalemic with K=2.9 pt will be admitted for seizure and r/o CVA.    Subjective: 2/24 MAXIMUM TEMPERATURE last 24 hours 38C   A/O 0. Withdraws to pain. Mumbles unintelligibly.   Assessment & Plan:   Principal Problem:   Seizure ([place]) Active Problems:   Essential hypertension   Anemia   Hypokalemia   Renal insufficiency   Hyperglycemia   PRES (posterior reversible encephalopathy syndrome)   Acute encephalopathy   Visual disturbance   Seizure -MRI/ MRA: Consistent with PRESS. See results below -Keppra IV 1000mg  BID -Continue seizure precautions.   CVA -Negative evidence of acute CVA. See MRI results below -Check lipid -Cont aspirin,  -Simvastatin 40mg  po qhs  Hypokalemia -Potassium 60 mEq per tube x2  Hypomagnesemia -Magnesium IV 2 g   Acute Renal failure Lab Results  Component Value Date   CREATININE  1.18 (H) 01/17/2017   CREATININE 1.24 (H) 01/16/2017   CREATININE 1.40 (H) 01/15/2017  -Resolving with hydration: Normal saline 55ml/hr   Hypertension(PRES) -Increase metoprolol 50 mg  BID -Increase Hydralazine 50 mg QID -Continue to titrate medication to slowly lower BP  Left Lung Abscess -Completed course of antibiotics   Asthma -Cont pulmicort, cont duoneb  Bipolar Disorder -Seroquel 100 mg daily  Chronic back pain -Given patient's mental status change hold Gabapentin, and Oxycodone    Goals of care -PALLIATIVE CARE; patient with multisystem organ failure discussed change of CODE STATUS to DO NOT RESUSCITATE, short-term vs long-term goals of care   DVT prophylaxis: SCD Code Status: Full Family Communication: None Disposition Plan: Palliative care recommendations pending   Consultants:  Neurology     Procedures/Significant Events:  2/22 CT head without contrast: -Subacute nonhemorrhagic left cerebellar infarct -Progressive parenchymal hypoattenuation in left cerebellum and bilateral posterior occipital lobes.-No acute findings. 2/23 MRI Brain;. -New extensive regions of cortical and subcortical T2 hyperintensity in the predominantly posterior cerebral hemispheres  Lt>> Rt  cerebellum, most c/w posterior reversible encephalopathy syndrome (PRESS) -Motion degraded MRA with multiple questioned anterior circulation stenoses vs motion artifact. Obtain CTA as an outpatient once the patient's acute illness has resolved.  - Hypoplastic versus occluded distal left vertebral artery. Widely patent and dominant right vertebral artery. -2 mm aneurysm vs prominent infundibulum at the left posterior communicating origin.:   VENTILATOR SETTINGS:    Cultures 2/23 urine negative 2/23 blood NGTD  Antimicrobials: Anti-infectives    Start     Stop   01/16/17 0245  linezolid (ZYVOX) IVPB 600 mg  Status:  Discontinued     01/16/17 1326   01/16/17 0130  vancomycin  (VANCOCIN) 50 mg/mL oral solution 250 mg    Comments:  Stop date 01/22/2017 after the last dose     01/22/17 2359   01/16/17 0116  linezolid (ZYVOX) tablet 600 mg  Status:  Discontinued     01/16/17 0239       Devices    LINES / TUBES:      Continuous Infusions: . sodium chloride 75 mL/hr at 01/17/17 0825     Objective: Vitals:   01/17/17 0607 01/17/17 0730 01/17/17 0801 01/17/17 1100  BP: (!) 179/109     Pulse:      Resp:      Temp:  (!) 100.4 F (38 C)  99.6 F (37.6 C)  TempSrc:  Axillary  Axillary  SpO2:   93%   Weight:        Intake/Output Summary (Last 24 hours) at 01/17/17 1227 Last data filed at 01/17/17 1158  Gross per 24 hour  Intake           2204.6 ml  Output             2575 ml  Net           -370.4 ml   Filed Weights   01/16/17 0100  Weight: 73 kg (160 lb 15 oz)    Examination:  General: A/O 0. Withdraws to pain. Mumbles unintelligibly, No acute respiratory distress Eyes: negative scleral hemorrhage, negative anisocoria, negative icterus ENT: Negative Runny nose, negative gingival bleeding, Neck:  Negative scars, masses, torticollis, lymphadenopathy, JVD Lungs: diffuse expiratory wheezing Lt>>Rt Cardiovascular: Tachycardic,Regular rhythm without murmur gallop or rub normal S1 and S2 Abdomen: negative abdominal pain, nondistended, positive soft, bowel sounds, no rebound, no ascites, no appreciable mass Extremities: No significant cyanosis, clubbing, or edema bilateral lower extremities Skin: Negative rashes, lesions, ulcers Psychiatric:  Central nervous system:   .     Data Reviewed: Care during the described time interval was provided by me .  I have reviewed this patient's available data, including medical history, events of note, physical examination, and all test results as part of my evaluation. I have personally reviewed and interpreted all radiology studies.  CBC:  Recent Labs Lab 01/11/17 0419 01/12/17 0639 01/15/17 1730  01/15/17 1738 01/17/17 0301  WBC 6.1 4.6 14.8*  --  12.2*  NEUTROABS  --   --  11.6*  --   --   HGB 8.4* 7.9* 11.5* 12.9 11.1*  HCT 28.0* 25.5* 35.9* 38.0 34.0*  MCV 92.7 92.4 89.8  --  86.7  PLT 332 309 504*  --  [id number]   Basic Metabolic Panel:  Recent Labs Lab 01/11/17 0419 01/12/17 0639 01/15/17 1730 01/15/17 1738 01/16/17 0701 01/17/17 0301  NA 139 140 139 135 139 138  K 4.3 3.9 2.9* 2.9* 3.0* 2.9*  CL 101 100* 86* 87* 90* 90*  CO2 28 30 30   --  34* 30  GLUCOSE 89 93 156* 161* 107* 118*  BUN 13 13 13 12 9 10   CREATININE 3.75* 3.37* 1.65* 1.40* 1.24* 1.18*  CALCIUM 9.4 8.9 9.3  --  8.2* 8.7*  MG  --   --  1.2*  --   --  1.8   GFR: Estimated Creatinine Clearance: 52.2 mL/min (by C-G formula based on SCr of 1.18  mg/dL (H)). Liver Function Tests:  Recent Labs Lab 01/15/17 1730 01/16/17 0701  AST 30 20  ALT 15 11*  ALKPHOS 128* 113  BILITOT 0.7 0.9  PROT 7.6 6.2*  ALBUMIN 3.3* 2.7*   No results for input(s): LIPASE, AMYLASE in the last 168 hours. No results for input(s): AMMONIA in the last 168 hours. Coagulation Profile:  Recent Labs Lab 01/15/17 1730  INR 1.09   Cardiac Enzymes: No results for input(s): CKTOTAL, CKMB, CKMBINDEX, TROPONINI in the last 168 hours. BNP (last 3 results) No results for input(s): PROBNP in the last 8760 hours. HbA1C:  Recent Labs  01/16/17 0701  HGBA1C 5.4   CBG:  Recent Labs Lab 01/15/17 1733  GLUCAP 156*   Lipid Profile:  Recent Labs  01/16/17 0701  CHOL 210*  HDL 55  LDLCALC 127*  TRIG 140  CHOLHDL 3.8   Thyroid Function Tests: No results for input(s): TSH, T4TOTAL, FREET4, T3FREE, THYROIDAB in the last 72 hours. Anemia Panel: No results for input(s): VITAMINB12, FOLATE, FERRITIN, TIBC, IRON, RETICCTPCT in the last 72 hours. Urine analysis:    Component Value Date/Time   COLORURINE STRAW (A) 01/15/2017 1719   APPEARANCEUR CLEAR 01/15/2017 1719   LABSPEC 1.008 01/15/2017 1719   PHURINE 8.0  01/15/2017 1719   GLUCOSEU NEGATIVE 01/15/2017 1719   HGBUR NEGATIVE 01/15/2017 1719   BILIRUBINUR NEGATIVE 01/15/2017 1719   KETONESUR NEGATIVE 01/15/2017 1719   PROTEINUR NEGATIVE 01/15/2017 1719   UROBILINOGEN 1.0 08/20/2014 1110   NITRITE NEGATIVE 01/15/2017 1719   LEUKOCYTESUR NEGATIVE 01/15/2017 1719   Sepsis Labs: @LABRCNTIP (procalcitonin:4,lacticidven:4)  ) Recent Results (from the past 240 hour(s))  MRSA PCR Screening     Status: None   Collection Time: 01/16/17  1:13 AM  Result Value Ref Range Status   MRSA by PCR NEGATIVE NEGATIVE Final    Comment:        The GeneXpert MRSA Assay (FDA approved for NASAL specimens only), is one component of a comprehensive MRSA colonization surveillance program. It is not intended to diagnose MRSA infection nor to guide or monitor treatment for MRSA infections.   Culture, blood (routine x 2)     Status: None (Preliminary result)   Collection Time: 01/16/17  3:51 PM  Result Value Ref Range Status   Specimen Description BLOOD LEFT ARM  Final   Special Requests IN PEDIATRIC BOTTLE 3CC  Final   Culture NO GROWTH < 24 HOURS  Final   Report Status PENDING  Incomplete  Culture, blood (routine x 2)     Status: None (Preliminary result)   Collection Time: 01/16/17  3:51 PM  Result Value Ref Range Status   Specimen Description BLOOD LEFT ARM  Final   Special Requests IN PEDIATRIC BOTTLE 2CC  Final   Culture NO GROWTH < 24 HOURS  Final   Report Status PENDING  Incomplete         Radiology Studies: Ct Head Wo Contrast  Result Date: 01/15/2017 CLINICAL DATA:  Per EMS, pt from [facility] home, staff reports pt reported she is not able to see. Did not report this to the Paramedic, however, she would not open her eyes and states that she does not feel good. EXAM: CT HEAD WITHOUT CONTRAST TECHNIQUE: Contiguous axial images were obtained from the base of the skull through the vertex without intravenous contrast. COMPARISON:   12/12/2016 FINDINGS: Brain: Progressive hypoattenuation posteriorly in both occipital lobes, left greater than right. Subacute nonhemorrhagic left cerebellar infarct. No acute intracranial hemorrhage.  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 Patchy areas of hypoattenuation in deep and periventricular white matter bilaterally. No midline shift. No extra-axial mass. No hydrocephalus. Vascular: No hyperdense vessel or unexpected calcification. Skull: Normal. Negative for fracture or focal lesion. Sinuses/Orbits: No acute finding. Other: None. IMPRESSION: 1. Progressive parenchymal hypoattenuation in left cerebellum and bilateral posterior occipital lobes. 2. No hemorrhage or other acute finding. Electronically Signed   By: Lucrezia Europe M.D.   On: 01/15/2017 18:24   Mr Brain Wo Contrast  Result Date: 01/16/2017 CLINICAL DATA:  New onset visual changes and seizure activity. Elevated blood pressure (215/127). EXAM: MRI HEAD WITHOUT CONTRAST MRA HEAD WITHOUT CONTRAST TECHNIQUE: Multiplanar, multiecho pulse sequences of the brain and surrounding structures were obtained without intravenous contrast. Angiographic images of the head were obtained using MRA technique without contrast. COMPARISON:  Head CT 01/15/2017 and MRI 12/14/2016 FINDINGS: MRI HEAD FINDINGS Multiple sequences are moderately motion degraded. Brain: A subcentimeter focus of abnormal diffusion signal is again seen in the left cerebellum, however there is new masslike confluent T2 hyperintensity in the left greater than right cerebellar hemispheres without significant posterior fossa mass effect. The there are also multiple new regions of relatively extensive, patchy and confluent cortical and subcortical T2 hyperintensity in both cerebral hemispheres, greatest in the occipital and posterior temporal lobes bilaterally as well as left parietal and high left frontal lobes. There is milder involvement of the right parietal and right frontal lobes.  Diffusion is largely facilitated in these regions, however there are areas of predominantly cortically based restricted diffusion most notable in the occipital lobes. No definite intracranial hemorrhage is identified. There is no midline shift or extra-axial fluid collection. Ventricles are unchanged in size. Encephalomalacia is again noted the anteroinferiorly in the left frontal lobe and may reflect remote trauma or prior ischemic infarction. Vascular: Non-visualization of the distal left vertebral artery, unchanged. Skull and upper cervical spine: Unremarkable bone marrow signal. Sinuses/Orbits: No gross acute abnormality. Other: None. MRA HEAD FINDINGS The study is moderately motion degraded. The visualized distal right vertebral artery is dominant and widely patent, supplying the basilar. A left vertebral artery is not identified and may be occluded or hypoplastic. The basilar artery is patent without evidence of significant stenosis. Left AICA and bilateral SCAs are visualized. There is a small posterior communicating artery on the left. PCAs are patent without evidence of significant proximal stenosis. The internal carotid arteries are patent from skullbase to carotid termini without evidence of flow limiting stenosis. A 2 x 2 mm outpouching is present at the left posterior communicating origin. ACAs and MCAs are patent. The left A1 segment is dominant. A potentially high-grade stenosis is questioned at the right ACA origin with potential moderate stenosis more distally in the right A1 segment, however this appearance may be at least partly due to motion artifact. There may be a moderate distal left M1 stenosis, and proximal M2 stenoses are questioned bilaterally. IMPRESSION: 1. New extensive regions of cortical and subcortical T2 hyperintensity in the predominantly posterior cerebral hemispheres and left greater than right cerebellum, most consistent with posterior reversible encephalopathy syndrome in this  setting. 2. Motion degraded MRA with multiple questioned anterior circulation stenoses versus motion artifact as above. Consider further evaluation with CTA as an outpatient once the patient's acute illness has resolved and he is better able to remain motionless. 3. Hypoplastic versus occluded distal left vertebral artery. Widely patent and dominant right vertebral artery. 4. 2 mm aneurysm versus prominent infundibulum at the left posterior  communicating origin. Electronically Signed   By: Logan Bores M.D.   On: 01/16/2017 13:28   Dg Chest Port 1 View  Result Date: 01/15/2017 CLINICAL DATA:  visual changes. Seems like she has AMS. Pt started seizing and drooling while EMT was putting patient on bedpain. Pt eyes deviated to right. Pt able to move freely but making clonic movements with her feet. Hx asthma, COPD, HTN, current smoker EXAM: PORTABLE CHEST - 1 VIEW COMPARISON:  01/08/2017 FINDINGS: Cavitary lesion in the left mid lung laterally as before. Left lateral pleural thickening or loculated effusion. No new infiltrate. Heart size upper limits normal. No pneumothorax. Visualized bones unremarkable. IMPRESSION: Stable findings in the left hemithorax. No acute or superimposed abnormality. Electronically Signed   By: Lucrezia Europe M.D.   On: 01/15/2017 18:19   Dg Abd Portable 1v  Result Date: 01/17/2017 CLINICAL DATA:  NG tube placement. EXAM: PORTABLE ABDOMEN - 1 VIEW COMPARISON:  Single-view of the abdomen 01/16/2017. FINDINGS: Feeding tube is again seen with the tip at the ligament of Treitz. No new tube is identified. IMPRESSION: Feeding tube tip is at the ligament of Treitz, unchanged. No new tube is seen. Electronically Signed   By: Inge Rise M.D.   On: 01/17/2017 11:14   Dg Abd Portable 1v  Result Date: 01/16/2017 CLINICAL DATA:  NG tube placement EXAM: PORTABLE ABDOMEN - 1 VIEW COMPARISON:  12/28/2016 FINDINGS: Feeding tube course of the stomach into the duodenum with the tip near the  ligament of Treitz. Normal bowel gas pattern. IMPRESSION: Feeding tube in the duodenum near the ligament of Treitz. Electronically Signed   By: Franchot Gallo M.D.   On: 01/16/2017 18:53   Mr Jodene Nam Head/brain [id number] Cm  Result Date: 01/16/2017 CLINICAL DATA:  New onset visual changes and seizure activity. Elevated blood pressure (215/127). EXAM: MRI HEAD WITHOUT CONTRAST MRA HEAD WITHOUT CONTRAST TECHNIQUE: Multiplanar, multiecho pulse sequences of the brain and surrounding structures were obtained without intravenous contrast. Angiographic images of the head were obtained using MRA technique without contrast. COMPARISON:  Head CT 01/15/2017 and MRI 12/14/2016 FINDINGS: MRI HEAD FINDINGS Multiple sequences are moderately motion degraded. Brain: A subcentimeter focus of abnormal diffusion signal is again seen in the left cerebellum, however there is new masslike confluent T2 hyperintensity in the left greater than right cerebellar hemispheres without significant posterior fossa mass effect. The there are also multiple new regions of relatively extensive, patchy and confluent cortical and subcortical T2 hyperintensity in both cerebral hemispheres, greatest in the occipital and posterior temporal lobes bilaterally as well as left parietal and high left frontal lobes. There is milder involvement of the right parietal and right frontal lobes. Diffusion is largely facilitated in these regions, however there are areas of predominantly cortically based restricted diffusion most notable in the occipital lobes. No definite intracranial hemorrhage is identified. There is no midline shift or extra-axial fluid collection. Ventricles are unchanged in size. Encephalomalacia is again noted the anteroinferiorly in the left frontal lobe and may reflect remote trauma or prior ischemic infarction. Vascular: Non-visualization of the distal left vertebral artery, unchanged. Skull and upper cervical spine: Unremarkable bone marrow signal.  Sinuses/Orbits: No gross acute abnormality. Other: None. MRA HEAD FINDINGS The study is moderately motion degraded. The visualized distal right vertebral artery is dominant and widely patent, supplying the basilar. A left vertebral artery is not identified and may be occluded or hypoplastic. The basilar artery is patent without evidence of significant stenosis. Left AICA and bilateral SCAs are  visualized. There is a small posterior communicating artery on the left. PCAs are patent without evidence of significant proximal stenosis. The internal carotid arteries are patent from skullbase to carotid termini without evidence of flow limiting stenosis. A 2 x 2 mm outpouching is present at the left posterior communicating origin. ACAs and MCAs are patent. The left A1 segment is dominant. A potentially high-grade stenosis is questioned at the right ACA origin with potential moderate stenosis more distally in the right A1 segment, however this appearance may be at least partly due to motion artifact. There may be a moderate distal left M1 stenosis, and proximal M2 stenoses are questioned bilaterally. IMPRESSION: 1. 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 2. Motion degraded MRA with multiple questioned anterior circulation stenoses versus motion artifact as above. Consider further evaluation with CTA as an outpatient once the patient's acute illness has resolved and he is better able to remain motionless. 3. Hypoplastic versus occluded distal left vertebral artery. Widely patent and dominant right vertebral artery. 4. 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 Electronically Signed   By: Logan Bores M.D.   On: 01/16/2017 13:28        Scheduled Meds: . budesonide  0.5 mg  Nebulization BID  . hydrALAZINE  25 mg Oral Q8H  . levETIRAcetam  1,000 mg Intravenous Q12H  . metoprolol tartrate  25 mg Oral BID  . pantoprazole (PROTONIX) IV  40 mg Intravenous Daily  . QUEtiapine  100 mg Oral QHS  . vancomycin  250 mg Oral Q6H   Continuous Infusions: . sodium chloride 75 mL/hr at 01/17/17 0825     LOS: 2 days    Time spent: 40 minutes     WOODS, Geraldo Docker, MD [facility] Pager [phone]   If 7PM-7AM, please contact night-coverage [email or web address] Password [email or web address] 01/17/2017, 12:27 PM

## 2017-01-17 NOTE — Progress Notes (Signed)
Pt pulled off Rt  Mitten and pulled out cortrak tube MD aware - order to replace and text paged Dietician to replace tube

## 2017-01-17 NOTE — Consult Note (Signed)
Consultation Note Date: 01/17/2017   Patient Name: [name]  DOB: Dec 19, 1965  MRN: [id number]  Age / Sex: 51 y.o., female  PCP: [name], MD Referring Physician: Allie Bossier, MD  Reason for Consultation: Establishing goals of care  HPI/Patient Profile: 51 y.o. female  with past medical history of asthma, COPD, bipolar d/o admitted on 01/15/2017 with visual changes. This is the second admission in the last two months. Patient was admitted previously 12/12/16 with AMS, SOB found to have multilobar pneumonia, resp failure requiring intubation. During that admission she was found to have cavitary lesion with MRSA + empyema and underwent VATS. She was extubated, her mental status and respiratory status improved and she was d/c'd to [facility] on  2/19. 2/22 She presented to ED with complaints of not being able to see. She then developed seizure in the ED. Further workup thus far has revealed likely PRES (current tx is to control BP) resulting in encephalopathy and seizures. She remains with altered mental status. Palliative medicine consulted to discuss code status and GOC.  Clinical Assessment and Goals of Care: Evaluated patient at bedside. She was arouseable, but did not open eyes. Not oriented to person, place or time. Denies pain, but appeared restless. Her answers to questions were unintelligible.  I spoke with her daughter, Azaila Hasberry, by phone. Introduced palliative medicine and concepts which are to assist patient's and families who have chronic illness and help relieve the stress and burden of symptoms and illness.  [name] tells me prior to her January admission patient was independent with all ADL's living at home with no mental status changes. When she was discharged to [facility] the expectation is that she would stay there "for a few days" and receive PT until she was strong enough to  return home. [name] is very unhappy with the care her Mom received at [facility] and feels she did not receive necessary therapy. [name] notes her Mom had poor quality of life at the SNF, but prior to her January admission patients quality of life was very good. [name] is agreeable to come in and meet with Palliative Medicine team and discuss further [place]. She is not available today, but may be able to come in tomorrow or Monday- she has to check her work schedule when she goes in today. I gave [employer] team phone and encouraged her to call and let us know when she is available to meet.    Primary Decision Maker NEXT OF KIN- daughterRaquel Sarna    SUMMARY OF RECOMMENDATIONS -Delirium precautions -Continue current care -[name] (patient's daughter) to call palliative care team to arrange meeting time     Code Status/Advance Care Planning:  Full code  Prognosis:    Unable to determine  Discharge Planning: To Be Determined  Primary Diagnoses: Present on Admission: . Anemia   I have reviewed the medical record, interviewed the patient and family, and examined the patient. The following aspects are pertinent.  Past Medical History:  Diagnosis  Date  . Allergic rhinitis   . Arthritis   . Asthma   . Back pain   . Bipolar 1 disorder ([place])   . C. difficile diarrhea   . Cavitary lesion of lung   . Chronic low back pain 03/06/2014  . COPD (chronic obstructive pulmonary disease) ([place])   . Depression   . Fibroid   . Hypertension   . Hypomagnesemia   . IBS (irritable bowel syndrome)   . Migraine   . Scoliosis    Social History   Social History  . Marital status: Single    Spouse name: N/A  . Number of children: 2  . Years of education: college   Occupational History  . disabled Disabled   Social History Main Topics  . Smoking status: Current Every Day Smoker    Packs/day: 0.50    Types: Cigarettes  . Smokeless tobacco: Never Used  . Alcohol use No  . Drug use:  No  . Sexual activity: No   Other Topics Concern  . None   Social History Narrative  . None   Family History  Problem Relation Age of Onset  . Stroke Mother   . Hypertension Brother   . Hypertension Brother   . Hypertension Brother   . Allergic rhinitis Neg Hx   . Angioedema Neg Hx   . Asthma Neg Hx   . Atopy Neg Hx   . Eczema Neg Hx   . Immunodeficiency Neg Hx   . Urticaria Neg Hx    Scheduled Meds: . budesonide  0.5 mg Nebulization BID  . hydrALAZINE  25 mg Oral Q8H  . levETIRAcetam  1,000 mg Intravenous Q12H  . metoprolol tartrate  25 mg Oral BID  . pantoprazole (PROTONIX) IV  40 mg Intravenous Daily  . QUEtiapine  100 mg Oral QHS  . vancomycin  250 mg Oral Q6H   Continuous Infusions: . sodium chloride 75 mL/hr at 01/17/17 0825   PRN Meds:.acetaminophen **OR** acetaminophen (TYLENOL) oral liquid 160 mg/5 mL **OR** acetaminophen, hydrALAZINE, ipratropium-albuterol, LORazepam, oxyCODONE Medications Prior to Admission:  Prior to Admission medications   Medication Sig Start Date End Date Taking? Authorizing Provider  budesonide (PULMICORT) 0.5 MG/2ML nebulizer solution Take 2 mLs (0.5 mg total) by nebulization 2 (two) times daily. 01/12/17  Yes Theodis Blaze, MD  fluconazole (DIFLUCAN) 200 MG tablet Take 1 tablet (200 mg total) by mouth daily. 01/13/17  Yes Theodis Blaze, MD  hydrALAZINE (APRESOLINE) 25 MG tablet Take 1 tablet (25 mg total) by mouth every 8 (eight) hours. 01/12/17  Yes Theodis Blaze, MD  HYDROcodone-homatropine [facility]) 5-1.5 MG/5ML syrup Take 5 mLs by mouth every 4 (four) hours as needed for cough. 01/12/17  Yes Theodis Blaze, MD  ipratropium-albuterol (DUONEB) 0.5-2.5 (3) MG/3ML SOLN Take 3 mLs by nebulization every 6 (six) hours as needed. 01/12/17  Yes Theodis Blaze, MD  linezolid (ZYVOX) 600 MG tablet Take 1 tablet (600 mg total) by mouth every 12 (twelve) hours. 01/12/17  Yes Theodis Blaze, MD  metoprolol tartrate (LOPRESSOR) 25 MG tablet Take 25 mg by  mouth 2 (two) times daily. Reported on 02/21/2016   Yes Historical Provider, MD  oxyCODONE (OXY IR/ROXICODONE) 5 MG immediate release tablet Take 1 tablet (5 mg total) by mouth every 6 (six) hours as needed for moderate pain. 01/12/17  Yes Theodis Blaze, MD  pantoprazole (PROTONIX) 40 MG tablet Take 1 tablet (40 mg total) by mouth daily at 12 noon. 01/12/17  Yes Theodis Blaze, MD  QUEtiapine (SEROQUEL) 100 MG tablet Take 1 tablet (100 mg total) by mouth at bedtime. 01/12/17  Yes Theodis Blaze, MD  vancomycin (VANCOCIN) 50 mg/mL oral solution Take 5 mLs (250 mg total) by mouth every 6 (six) hours. Stop date 01/22/2017 after the last dose 01/12/17 01/22/17 Yes Iskra Barbera Setters, MD   Allergies  Allergen Reactions  . Cymbalta [Duloxetine Hcl] Other (See Comments)    Mood changes...mean  . Mucinex [Guaifenesin Er] Other (See Comments)    Possible allergy, per patient (??)  . Nyquil Multi-Symptom [Pseudoeph-Doxylamine-Dm-Apap] Hives  . Paxil [Paroxetine Hcl] Other (See Comments)    Mean and aggresive  . Promethazine Itching  . Tramadol Itching  . Wellbutrin [Bupropion] Other (See Comments)    Rapid heartrate  . Zoloft [Sertraline] Other (See Comments)    Anger   Review of Systems  Unable to perform ROS: Acuity of condition    Physical Exam  Constitutional: She appears well-developed.  In bed, mitts in place  Cardiovascular: Normal rate and regular rhythm.   Pulmonary/Chest: Effort normal and breath sounds normal.  Psychiatric: Cognition and memory are impaired.  Vitals reviewed.   Vital Signs: BP (!) 179/109   Pulse 85   Temp 99.6 F (37.6 C) (Axillary)   Resp (!) 26   Wt 73 kg (160 lb 15 oz)   SpO2 93%   BMI 29.92 kg/m  Pain Assessment: CPOT   Pain Score: Asleep   SpO2: SpO2: 93 % O2 Device:SpO2: 93 % O2 Flow Rate: .O2 Flow Rate (L/min): 2 L/min  IO: Intake/output summary:  Intake/Output Summary (Last 24 hours) at 01/17/17 1209 Last data filed at 01/17/17 1158  Gross per 24  hour  Intake           2204.6 ml  Output             2575 ml  Net           -370.4 ml    LBM: Last BM Date: 01/17/17 Baseline Weight: Weight: 73 kg (160 lb 15 oz) Most recent weight: Weight: 73 kg (160 lb 15 oz)     Palliative Assessment/Data: PPS: 10%     Thank you for this consult. Palliative medicine will continue to follow and assist as needed.   Time Total: 60 minutes Greater than 50%  of this time was spent counseling and coordinating care related to the above assessment and plan.  Signed by: Mariana Kaufman, AGNP-C Palliative Medicine    Please contact Palliative Medicine Team phone at [phone] for questions and concerns.  For individual provider: See Shea Evans

## 2017-01-17 NOTE — Plan of Care (Signed)
Problem: Education: Goal: Knowledge of secondary prevention will improve Outcome: Not Progressing Pt is not able to participate in education at this time.

## 2017-01-18 LAB — BASIC METABOLIC PANEL
ANION GAP: 15 (ref 5–15)
BUN: 15 mg/dL (ref 6–20)
CALCIUM: 8.8 mg/dL — AB (ref 8.9–10.3)
CO2: 28 mmol/L (ref 22–32)
CREATININE: 1.08 mg/dL — AB (ref 0.44–1.00)
Chloride: 95 mmol/L — ABNORMAL LOW (ref 101–111)
GFR calc Af Amer: >60 mL/min (ref >60)
GFR, EST NON AFRICAN AMERICAN: 58 mL/min — AB (ref >60)
GLUCOSE: 110 mg/dL — AB (ref 65–99)
Potassium: 4.1 mmol/L (ref 3.5–5.1)
Sodium: 138 mmol/L (ref 135–145)

## 2017-01-18 LAB — GLUCOSE, CAPILLARY
GLUCOSE-CAPILLARY: 131 mg/dL — AB (ref 65–99)
GLUCOSE-CAPILLARY: 118 mg/dL — AB (ref 65–99)
GLUCOSE-CAPILLARY: 114 mg/dL — AB (ref 65–99)
GLUCOSE-CAPILLARY: 108 mg/dL — AB (ref 65–99)
Glucose-Capillary: 120 mg/dL — ABNORMAL HIGH (ref 65–99)

## 2017-01-18 LAB — TSH: TSH: 1.838 u[IU]/mL (ref 0.350–4.500)

## 2017-01-18 LAB — CBC
HCT: 34.1 % — ABNORMAL LOW (ref 36.0–46.0)
Hemoglobin: 10.9 g/dL — ABNORMAL LOW (ref 12.0–15.0)
MCH: 27.9 pg (ref 26.0–34.0)
MCHC: 32 g/dL (ref 30.0–36.0)
MCV: 87.2 fL (ref 78.0–100.0)
PLATELETS: 334 10*3/uL (ref 150–400)
RBC: 3.91 MIL/uL (ref 3.87–5.11)
RDW: 15.9 % — AB (ref 11.5–15.5)
WBC: 9 10*3/uL (ref 4.0–10.5)

## 2017-01-18 LAB — MAGNESIUM: Magnesium: 2.1 mg/dL (ref 1.7–2.4)

## 2017-01-18 LAB — VITAMIN B12: Vitamin B-12: 333 pg/mL (ref 180–914)

## 2017-01-18 LAB — CORTISOL-AM, BLOOD: Cortisol - AM: 22.5 ug/dL (ref 6.7–22.6)

## 2017-01-18 MED ORDER — WHITE PETROLATUM GEL
Status: AC
  Administered 2017-01-18: 20:00:00
  Filled 2017-01-18: qty 1

## 2017-01-18 NOTE — Progress Notes (Signed)
Neurology Progress Note  Subjective: No major overnight events noted. She is largely unchanged for me this morning. She is sleeping and rouses to voice and tactile stimulation but does not open her eyes and does not verbalize. She does not participate with ROS or with the exam which is limited to observation.   Medications reviewed and reconciled.   Pertinent meds:  Keppra 1000 mg q12h Lorazepam 2 mg IV q4h prn, none given Quetiapine 100 mg qhs  Current Meds:   Current Facility-Administered Medications:  .  0.9 %  sodium chloride infusion, , Intravenous, Continuous, Allie Bossier, MD, Last Rate: 75 mL/hr at 01/17/17 2153 .  acetaminophen (TYLENOL) tablet 650 mg, 650 mg, Oral, Q4H PRN **OR** acetaminophen (TYLENOL) solution 650 mg, 650 mg, Per Tube, Q4H PRN **OR** acetaminophen (TYLENOL) suppository 650 mg, 650 mg, Rectal, Q4H PRN, Jani Gravel, MD .  budesonide (PULMICORT) nebulizer solution 0.5 mg, 0.5 mg, Nebulization, BID, Jani Gravel, MD, 0.5 mg at 01/17/17 2031 .  feeding supplement (VITAL AF 1.2 CAL) liquid 1,000 mL, 1,000 mL, Per Tube, Continuous, Allie Bossier, MD, Last Rate: 40 mL/hr at 01/18/17 0019, 1,000 mL at 01/18/17 0019 .  hydrALAZINE (APRESOLINE) injection 10 mg, 10 mg, Intravenous, Q6H PRN, Allie Bossier, MD .  hydrALAZINE (APRESOLINE) tablet 50 mg, 50 mg, Oral, Q6H, Allie Bossier, MD, 50 mg at 01/18/17 0546 .  ipratropium-albuterol (DUONEB) 0.5-2.5 (3) MG/3ML nebulizer solution 3 mL, 3 mL, Nebulization, Q6H PRN, Jani Gravel, MD .  levETIRAcetam (KEPPRA) 1,000 mg in sodium chloride 0.9 % 100 mL IVPB, 1,000 mg, Intravenous, Q12H, Jani Gravel, MD, 1,000 mg at 01/18/17 0009 .  LORazepam (ATIVAN) injection 2 mg, 2 mg, Intravenous, Q4H PRN, Jani Gravel, MD .  metoprolol (LOPRESSOR) tablet 50 mg, 50 mg, Oral, BID, Allie Bossier, MD, 50 mg at 01/17/17 2040 .  pantoprazole (PROTONIX) injection 40 mg, 40 mg, Intravenous, Daily, Rhetta Mura Schorr, NP, 40 mg at 01/17/17 0954 .  potassium  chloride (KLOR-CON) packet 60 mEq, 60 mEq, Per Tube, BID, Allie Bossier, MD, 60 mEq at 01/17/17 2039 .  QUEtiapine (SEROQUEL) tablet 100 mg, 100 mg, Oral, QHS, Jani Gravel, MD, 100 mg at 01/17/17 2040 .  simvastatin (ZOCOR) tablet 40 mg, 40 mg, Per Tube, q1800, Allie Bossier, MD .  vancomycin (VANCOCIN) 50 mg/mL oral solution 250 mg, 250 mg, Oral, Q6H, Jani Gravel, MD, 250 mg at 01/18/17 0546  Objective:  Temp:  [98.1 F (36.7 C)-100.1 F (37.8 C)] 99.5 F (37.5 C) (02/25 0357) Pulse Rate:  [90-122] 107 (02/25 0357) Resp:  [14-26] 21 (02/25 0357) BP: (143-174)/(86-109) 148/92 (02/25 0614) SpO2:  [91 %-95 %] 94 % (02/25 0357) Weight:  [69.7 kg (153 lb 10.6 oz)-70.7 kg (155 lb 13.8 oz)] 69.7 kg (153 lb 10.6 oz) (02/25 0357)  General: WDWN woman lying in bed. She is asleep. She will open her eyes briefly when I call her name but she does not follow any commands. She does not participate with the exam which is largely limited to observation as a result. She is nonverbal, only moaning intermittently.   HEENT: Neck is supple without lymphadenopathy. She would not open her mouth for me so I could not assess her oropharynx. Sclerae are anicteric. There is no conjunctival injection.  CV: Regular, no murmur. Carotid pulses are 2+ and symmetric with no bruits. Distal pulses 2+ and symmetric.  Lungs: CTAB  Extremities: No C/C/E. Neuro: MS: As noted above.  CN: Pupils are equal  and reactive from 3-->2 mm bilaterally. She blinks to visual threat. Eyes are conjugate. She resists passive eye opening with good strength. She has intact corneals. Her face is symmetric at rest and she has a symmetric grimace. The remainder of her cranial nerve exam is limited by poor cooperation.  Motor: Normal bulk, tone. She moves all four extremities with grossly normal strength. She does not participate with confrontational testing. No tremor or other abnormal movements are observed.  Sensation: She grimaces and withdraws  from minimal noxious stimluation.  DTRs: 2+, symmetric. Toes are downgoing bilaterally. No pathological reflexes.  Coordination and gait: These cannot be assessed as the patient is not able to participate with the exam.   Labs: Lab Results  Component Value Date   WBC 9.0 01/18/2017   HGB 10.9 (L) 01/18/2017   HCT 34.1 (L) 01/18/2017   PLT 334 01/18/2017   GLUCOSE 110 (H) 01/18/2017   CHOL 210 (H) 01/16/2017   TRIG 140 01/16/2017   HDL 55 01/16/2017   LDLCALC 127 (H) 01/16/2017   ALT 11 (L) 01/16/2017   AST 20 01/16/2017   NA 138 01/18/2017   K 4.1 01/18/2017   CL 95 (L) 01/18/2017   CREATININE 1.08 (H) 01/18/2017   BUN 15 01/18/2017   CO2 28 01/18/2017   TSH 0.892 04/17/2014   INR 1.09 01/15/2017   HGBA1C 5.4 01/16/2017   CBC Latest Ref Rng & Units 01/18/2017 01/17/2017 01/15/2017  WBC 4.0 - 10.5 K/uL 9.0 12.2(H) -  Hemoglobin 12.0 - 15.0 g/dL 10.9(L) 11.1(L) 12.9  Hematocrit 36.0 - 46.0 % 34.1(L) 34.0(L) 38.0  Platelets 150 - 400 K/uL 334 360 -    Lab Results  Component Value Date   HGBA1C 5.4 01/16/2017   Lab Results  Component Value Date   ALT 11 (L) 01/16/2017   AST 20 01/16/2017   ALKPHOS 113 01/16/2017   BILITOT 0.9 01/16/2017   Blood cx 2/23 NGTD  Pending labs: B12 TSH AM cortisol RPR  Radiology:  There is no new neuroimaging for review.   A/P:   1. PRES: Continue to gradually tighten blood pressure control.   2. Seizures: These were triggered by PRES. Continue Keppra for three months. If she remains seizure-free could discontinue it at that point. Seizure precautions. Unable to discuss seizure precautions or AED side effects with her at this time due to her encephalopathy and no family is present at the bedside.   3. Acute encephalopathy: This is due to PRES. Continue to gradually tighten BP control. Avoid CNS active meds, particularly benzos, opiates, and anything with strong anticholinergic properties. Continue to optimize metabolic status as you  are. Anticipate gradual improvement in mentation over the coming days/weeks.   4. Visual disturbance: She initially complained of visual disturbance which would be consistent with PRES. Follow. This will need to be evaluated once the patient is able to participate with the exam.   No family is present at the time of my visit.     Melba Coon, MD [facility]

## 2017-01-18 NOTE — Progress Notes (Signed)
PROGRESS NOTE    [name]  WF:[id number] DOB: 1966/09/22 DOA: 01/15/2017 PCP: [name], MD   Brief Narrative:  [name]  is a 51 y.o. WF PMHx Bipolar 1 disorder,Depression, w hx of Asthma/Copd, Cavitary lesion left lung, Vats 12/26/2016, Chronic low back pain, apparently was at [facility]  Brought to ED for ? Vision changes.  Pt has baseline confusion? Per staff.    In ED, pt had brief 60 sec, tonic clonic seizure with postictal state.  Pt tx with ativan 2mg  iv x1 with relief.  Pt appears to be oriented x2 (not time).  ED consulted neurology and decided against code stroke.  CT brain  01/15/2017 => subactue nonhemorrhagic left cerebellar infarct, no acute intracranial hemorrhage, old left frontal infarct with encephalomalacia.  MRI brain 12/14/2016=> small cluster of acute infarctions in the left cerebellum, no mass effect or hemorrhage. 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  Ekg ST at 105, nl axis, poor R progression.  CXR showed stable left cavitary area.  Pt appears more alert at this time.  Pt able to move all 4 ext.  Pt noted to be hypokalemic with K=2.9 pt will be admitted for seizure and r/o CVA.    Subjective: No major events overnight. Patient's conditions remains the same. She is slightly tachypneic this morning. She is still nonverbal but able to trace as I more on the room.   Assessment & Plan:   Principal Problem:   Seizure ([place]) Active Problems:   Essential hypertension   Anemia   Hypokalemia   Renal insufficiency   Hyperglycemia   PRES (posterior reversible encephalopathy syndrome)   Acute encephalopathy   Visual disturbance   Palliative care by specialist   Goals of care, counseling/discussion   Acute renal failure (HCC)   Bipolar I disorder (HCC)   Chronic pain syndrome   PRES/Seizure -MRI/ MRA: Consistent with PRESS. See results below -Keppra IV 1000mg  BID -Continue seizure precautions.  -B12, folate, TSH was never drawn therefore  I'll reorder it again today. Will check cortisol a.m. as well. -Supposed to have palliative care meeting today with the family  CVA -Negative evidence of acute CVA. See MRI results below -LDL is 127 -Cont aspirin,  -Simvastatin 40mg  po qhs  Hypokalemia -Improved  Hypomagnesemia -Improved  Acute Renal failure-improving Lab Results  Component Value Date   CREATININE 1.08 (H) 01/18/2017   CREATININE 1.18 (H) 01/17/2017   CREATININE 1.24 (H) 01/16/2017  -Resolving with hydration: Normal saline 24ml/hr  -Maintain Foley; continue to monitor accurate urine output  Hypertension(PRES) -Increase metoprolol 50 mg  BID -Increase Hydralazine 50 mg QID -Continue to titrate medication to slowly lower BP  Left Lung Abscess -Completed course of antibiotics   Asthma -Cont pulmicort, cont duoneb  Bipolar Disorder -Seroquel 100 mg daily  Chronic back pain -Given patient's mental status change hold Gabapentin, and Oxycodone    Goals of care -PALLIATIVE CARE; patient with multisystem organ failure discussed change of CODE STATUS to DO NOT RESUSCITATE, short-term vs long-term goals of care   DVT prophylaxis: SCD Code Status: Full Family Communication: None Disposition Plan: Palliative care recommendations pending   Consultants:  Neurology     Procedures/Significant Events:  2/22 CT head without contrast: -Subacute nonhemorrhagic left cerebellar infarct -Progressive parenchymal hypoattenuation in left cerebellum and bilateral posterior occipital lobes.-No acute findings. 2/23 MRI Brain;. -New extensive regions of cortical and subcortical T2 hyperintensity in the predominantly posterior cerebral hemispheres  Lt>> Rt  cerebellum, most c/w posterior reversible  encephalopathy syndrome (PRESS) -Motion degraded MRA with multiple questioned anterior circulation stenoses vs motion artifact. Obtain CTA as an outpatient once the patient's acute illness has resolved.  -  Hypoplastic versus occluded distal left vertebral artery. Widely patent and dominant right vertebral artery. -2 mm aneurysm vs prominent infundibulum at the left posterior communicating origin.:   VENTILATOR SETTINGS:    Cultures 2/23 urine negative 2/23 blood NGTD    Antimicrobials: Anti-infectives    Start     Stop   01/16/17 0245  linezolid (ZYVOX) IVPB 600 mg  Status:  Discontinued     01/16/17 1326   01/16/17 0130  vancomycin (VANCOCIN) 50 mg/mL oral solution 250 mg    Comments:  Stop date 01/22/2017 after the last dose     01/22/17 2359   01/16/17 0116  linezolid (ZYVOX) tablet 600 mg  Status:  Discontinued     01/16/17 0239       Devices    LINES / TUBES:      Continuous Infusions: . sodium chloride 75 mL/hr at 01/17/17 2153  . feeding supplement (VITAL AF 1.2 CAL) 1,000 mL (01/18/17 0019)     Objective: Vitals:   01/18/17 0614 01/18/17 0816 01/18/17 0825 01/18/17 1124  BP: (!) 148/92 (!) 168/87  (!) 150/79  Pulse:  (!) 102  (!) 102  Resp:  (!) 23    Temp:  98.9 F (37.2 C)    TempSrc:  Axillary    SpO2:  95% 95%   Weight:  73.5 kg (162 lb 0.6 oz)      Intake/Output Summary (Last 24 hours) at 01/18/17 1133 Last data filed at 01/18/17 0816  Gross per 24 hour  Intake          2445.75 ml  Output             1225 ml  Net          1220.75 ml   Filed Weights   01/17/17 1335 01/18/17 0357 01/18/17 0816  Weight: 70.7 kg (155 lb 13.8 oz) 69.7 kg (153 lb 10.6 oz) 73.5 kg (162 lb 0.6 oz)    Examination:  General: A/O 0. Withdraws to pain. Mumbles unintelligibly, No acute respiratory distress Eyes: negative scleral hemorrhage, negative anisocoria, negative icterus ENT: Negative Runny nose, negative gingival bleeding, Neck:  Negative scars, masses, torticollis, lymphadenopathy, JVD Lungs: diffuse expiratory wheezing Lt>>Rt Cardiovascular: Tachycardic,Regular rhythm without murmur gallop or rub normal S1 and S2 Abdomen: negative abdominal pain,  nondistended, positive soft, bowel sounds, no rebound, no ascites, no appreciable mass Extremities: No significant cyanosis, clubbing, or edema bilateral lower extremities Skin: Negative rashes, lesions, ulcers Psychiatric:  Central nervous system:   .     Data Reviewed: Care during the described time interval was provided by me .  I have reviewed this patient's available data, including medical history, events of note, physical examination, and all test results as part of my evaluation. I have personally reviewed and interpreted all radiology studies.  CBC:  Recent Labs Lab 01/12/17 0639 01/15/17 1730 01/15/17 1738 01/17/17 0301 01/18/17 0323  WBC 4.6 14.8*  --  12.2* 9.0  NEUTROABS  --  11.6*  --   --   --   HGB 7.9* 11.5* 12.9 11.1* 10.9*  HCT 25.5* 35.9* 38.0 34.0* 34.1*  MCV 92.4 89.8  --  86.7 87.2  PLT 309 504*  --  360 [id number]   Basic Metabolic Panel:  Recent Labs Lab 01/12/17 0639 01/15/17 1730 01/15/17 1738 01/16/17 0701  01/17/17 0301 01/18/17 0323  NA 140 139 135 139 138 138  K 3.9 2.9* 2.9* 3.0* 2.9* 4.1  CL 100* 86* 87* 90* 90* 95*  CO2 30 30  --  34* 30 28  GLUCOSE 93 156* 161* 107* 118* 110*  BUN 13 13 12 9 10 15   CREATININE 3.37* 1.65* 1.40* 1.24* 1.18* 1.08*  CALCIUM 8.9 9.3  --  8.2* 8.7* 8.8*  MG  --  1.2*  --   --  1.8 2.1   GFR: Estimated Creatinine Clearance: 57.2 mL/min (by C-G formula based on SCr of 1.08 mg/dL (H)). Liver Function Tests:  Recent Labs Lab 01/15/17 1730 01/16/17 0701  AST 30 20  ALT 15 11*  ALKPHOS 128* 113  BILITOT 0.7 0.9  PROT 7.6 6.2*  ALBUMIN 3.3* 2.7*   No results for input(s): LIPASE, AMYLASE in the last 168 hours. No results for input(s): AMMONIA in the last 168 hours. Coagulation Profile:  Recent Labs Lab 01/15/17 1730  INR 1.09   Cardiac Enzymes: No results for input(s): CKTOTAL, CKMB, CKMBINDEX, TROPONINI in the last 168 hours. BNP (last 3 results) No results for input(s): PROBNP in the last 8760  hours. HbA1C:  Recent Labs  01/16/17 0701  HGBA1C 5.4   CBG:  Recent Labs Lab 01/15/17 1733 01/17/17 2034 01/17/17 2333 01/18/17 0357 01/18/17 0753  GLUCAP 156* 119* 107* 131* 118*   Lipid Profile:  Recent Labs  01/16/17 0701  CHOL 210*  HDL 55  LDLCALC 127*  TRIG 140  CHOLHDL 3.8   Thyroid Function Tests:  Recent Labs  01/18/17 0805  TSH 1.838   Anemia Panel:  Recent Labs  01/18/17 0813  VITAMINB12 333   Urine analysis:    Component Value Date/Time   COLORURINE STRAW (A) 01/15/2017 1719   APPEARANCEUR CLEAR 01/15/2017 1719   LABSPEC 1.008 01/15/2017 1719   PHURINE 8.0 01/15/2017 1719   GLUCOSEU NEGATIVE 01/15/2017 1719   HGBUR NEGATIVE 01/15/2017 1719   BILIRUBINUR NEGATIVE 01/15/2017 1719   KETONESUR NEGATIVE 01/15/2017 1719   PROTEINUR NEGATIVE 01/15/2017 1719   UROBILINOGEN 1.0 08/20/2014 1110   NITRITE NEGATIVE 01/15/2017 1719   LEUKOCYTESUR NEGATIVE 01/15/2017 1719   Sepsis Labs: @LABRCNTIP (procalcitonin:4,lacticidven:4)  ) Recent Results (from the past 240 hour(s))  MRSA PCR Screening     Status: None   Collection Time: 01/16/17  1:13 AM  Result Value Ref Range Status   MRSA by PCR NEGATIVE NEGATIVE Final    Comment:        The GeneXpert MRSA Assay (FDA approved for NASAL specimens only), is one component of a comprehensive MRSA colonization surveillance program. It is not intended to diagnose MRSA infection nor to guide or monitor treatment for MRSA infections.   Culture, Urine     Status: None   Collection Time: 01/16/17  2:32 PM  Result Value Ref Range Status   Specimen Description URINE, CATHETERIZED  Final   Special Requests NONE  Final   Culture NO GROWTH  Final   Report Status 01/17/2017 FINAL  Final  Culture, blood (routine x 2)     Status: None (Preliminary result)   Collection Time: 01/16/17  3:51 PM  Result Value Ref Range Status   Specimen Description BLOOD LEFT ARM  Final   Special Requests IN PEDIATRIC  BOTTLE 3CC  Final   Culture NO GROWTH < 24 HOURS  Final   Report Status PENDING  Incomplete  Culture, blood (routine x 2)     Status: None (Preliminary  result)   Collection Time: 01/16/17  3:51 PM  Result Value Ref Range Status   Specimen Description BLOOD LEFT ARM  Final   Special Requests IN PEDIATRIC BOTTLE 2CC  Final   Culture NO GROWTH < 24 HOURS  Final   Report Status PENDING  Incomplete         Radiology Studies: Mr Brain 75 Contrast  Result Date: 01/16/2017 CLINICAL DATA:  New onset visual changes and seizure activity. Elevated blood pressure (215/127). EXAM: MRI HEAD WITHOUT CONTRAST MRA HEAD WITHOUT CONTRAST TECHNIQUE: Multiplanar, multiecho pulse sequences of the brain and surrounding structures were obtained without intravenous contrast. Angiographic images of the head were obtained using MRA technique without contrast. COMPARISON:  Head CT 01/15/2017 and MRI 12/14/2016 FINDINGS: MRI HEAD FINDINGS Multiple sequences are moderately motion degraded. Brain: A subcentimeter focus of abnormal diffusion signal is again seen in the left cerebellum, however there is new masslike confluent T2 hyperintensity in the left greater than right cerebellar hemispheres without significant posterior fossa mass effect. The there are also multiple new regions of relatively extensive, patchy and confluent cortical and subcortical T2 hyperintensity in both cerebral hemispheres, greatest in the occipital and posterior temporal lobes bilaterally as well as left parietal and high left frontal lobes. There is milder involvement of the right parietal and right frontal lobes. Diffusion is largely facilitated in these regions, however there are areas of predominantly cortically based restricted diffusion most notable in the occipital lobes. No definite intracranial hemorrhage is identified. There is no midline shift or extra-axial fluid collection. Ventricles are unchanged in size. Encephalomalacia is again noted  the anteroinferiorly in the left frontal lobe and may reflect remote trauma or prior ischemic infarction. Vascular: Non-visualization of the distal left vertebral artery, unchanged. Skull and upper cervical spine: Unremarkable bone marrow signal. Sinuses/Orbits: No gross acute abnormality. Other: None. MRA HEAD FINDINGS The study is moderately motion degraded. The visualized distal right vertebral artery is dominant and widely patent, supplying the basilar. A left vertebral artery is not identified and may be occluded or hypoplastic. The basilar artery is patent without evidence of significant stenosis. Left AICA and bilateral SCAs are visualized. There is a small posterior communicating artery on the left. PCAs are patent without evidence of significant proximal stenosis. The internal carotid arteries are patent from skullbase to carotid termini without evidence of flow limiting stenosis. A 2 x 2 mm outpouching is present at the left posterior communicating origin. ACAs and MCAs are patent. The left A1 segment is dominant. A potentially high-grade stenosis is questioned at the right ACA origin with potential moderate stenosis more distally in the right A1 segment, however this appearance may be at least partly due to motion artifact. There may be a moderate distal left M1 stenosis, and proximal M2 stenoses are questioned bilaterally. IMPRESSION: 1. 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 2. Motion degraded MRA with multiple questioned anterior circulation stenoses versus motion artifact as above. Consider further evaluation with CTA as an outpatient once the patient's acute illness has resolved and he is better able to remain motionless. 3. Hypoplastic versus occluded distal left vertebral artery. Widely patent and  dominant right vertebral artery. 4. 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 Electronically Signed   By: Logan Bores M.D.   On: 01/16/2017 13:28   Dg Abd Portable  1v  Result Date: 01/17/2017 CLINICAL DATA:  NG tube placement. EXAM: PORTABLE ABDOMEN - 1 VIEW COMPARISON:  Single-view of the abdomen 01/16/2017. FINDINGS: Feeding tube is again seen with the tip at the ligament of Treitz. No new tube is identified. IMPRESSION: Feeding tube tip is at the ligament of Treitz, unchanged. No new tube is seen. Electronically Signed   By: Inge Rise M.D.   On: 01/17/2017 11:14   Dg Abd Portable 1v  Result Date: 01/16/2017 CLINICAL DATA:  NG tube placement EXAM: PORTABLE ABDOMEN - 1 VIEW COMPARISON:  12/28/2016 FINDINGS: Feeding tube course of the stomach into the duodenum with the tip near the ligament of Treitz. Normal bowel gas pattern. IMPRESSION: Feeding tube in the duodenum near the ligament of Treitz. Electronically Signed   By: Franchot Gallo M.D.   On: 01/16/2017 18:53   Mr Jodene Nam Head/brain [id number] Cm  Result Date: 01/16/2017 CLINICAL DATA:  New onset visual changes and seizure activity. Elevated blood pressure (215/127). EXAM: MRI HEAD WITHOUT CONTRAST MRA HEAD WITHOUT CONTRAST TECHNIQUE: Multiplanar, multiecho pulse sequences of the brain and surrounding structures were obtained without intravenous contrast. Angiographic images of the head were obtained using MRA technique without contrast. COMPARISON:  Head CT 01/15/2017 and MRI 12/14/2016 FINDINGS: MRI HEAD FINDINGS Multiple sequences are moderately motion degraded. Brain: A subcentimeter focus of abnormal diffusion signal is again seen in the left cerebellum, however there is new masslike confluent T2 hyperintensity in the left greater than right cerebellar hemispheres without significant posterior fossa mass effect. The there are also multiple new regions of relatively extensive, patchy and confluent  cortical and subcortical T2 hyperintensity in both cerebral hemispheres, greatest in the occipital and posterior temporal lobes bilaterally as well as left parietal and high left frontal lobes. There is milder involvement of the right parietal and right frontal lobes. Diffusion is largely facilitated in these regions, however there are areas of predominantly cortically based restricted diffusion most notable in the occipital lobes. No definite intracranial hemorrhage is identified. There is no midline shift or extra-axial fluid collection. Ventricles are unchanged in size. Encephalomalacia is again noted the anteroinferiorly in the left frontal lobe and may reflect remote trauma or prior ischemic infarction. Vascular: Non-visualization of the distal left vertebral artery, unchanged. Skull and upper cervical spine: Unremarkable bone marrow signal. Sinuses/Orbits: No gross acute abnormality. Other: None. MRA HEAD FINDINGS The study is moderately motion degraded. The visualized distal right vertebral artery is dominant and widely patent, supplying the basilar. A left vertebral artery is not identified and may be occluded or hypoplastic. The basilar artery is patent without evidence of significant stenosis. Left AICA and bilateral SCAs are visualized. There is a small posterior communicating artery on the left. PCAs are patent without evidence of significant proximal stenosis. The internal carotid arteries are patent from skullbase to carotid termini without evidence of flow limiting stenosis. A 2 x 2 mm outpouching is present at the left posterior communicating origin. ACAs and MCAs are patent. The left A1 segment is dominant. A potentially high-grade stenosis is questioned at the right ACA origin with potential moderate stenosis more distally in the right A1 segment, however this appearance may be at least partly due to motion artifact. There may be a moderate distal left M1 stenosis, and proximal M2 stenoses are  questioned bilaterally. IMPRESSION: 1. New extensive regions of cortical and subcortical T2 hyperintensity in the predominantly posterior cerebral hemispheres and left greater than right cerebellum, most consistent with posterior reversible encephalopathy syndrome in  this setting. 2. Motion degraded MRA with multiple questioned anterior circulation stenoses versus motion artifact as above. Consider further evaluation with CTA as an outpatient once the patient's acute illness has resolved and he is better able to remain motionless. 3. Hypoplastic versus occluded distal left vertebral artery. Widely patent and dominant right vertebral artery. 4. 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 Electronically Signed   By: Logan Bores M.D.   On: 01/16/2017 13:28        Scheduled Meds: . budesonide  0.5 mg Nebulization BID  . hydrALAZINE  50 mg Oral Q6H  . levETIRAcetam  1,000 mg Intravenous Q12H  . metoprolol tartrate  50 mg Oral BID  . pantoprazole (PROTONIX) IV  40 mg Intravenous Daily  . potassium chloride  60 mEq Per Tube BID  . QUEtiapine  100 mg Oral QHS  . simvastatin  40 mg Per Tube q1800  . vancomycin  250 mg Oral Q6H   Continuous Infusions: . sodium chloride 75 mL/hr at 01/17/17 2153  . feeding supplement (VITAL AF 1.2 CAL) 1,000 mL (01/18/17 0019)     LOS: 3 days    Time spent: 40 minutes     Lakeesha Fontanilla Arsenio Loader, MD [facility] Pager [phone]   If 7PM-7AM, please contact night-coverage [email or web address] Password [facility] 01/18/2017, 11:33 AM

## 2017-01-19 LAB — GLUCOSE, CAPILLARY
Glucose-Capillary: 102 mg/dL — ABNORMAL HIGH (ref 65–99)
Glucose-Capillary: 102 mg/dL — ABNORMAL HIGH (ref 65–99)
Glucose-Capillary: 103 mg/dL — ABNORMAL HIGH (ref 65–99)
Glucose-Capillary: 115 mg/dL — ABNORMAL HIGH (ref 65–99)
Glucose-Capillary: 89 mg/dL (ref 65–99)
Glucose-Capillary: 97 mg/dL (ref 65–99)

## 2017-01-19 LAB — CBC
HEMATOCRIT: 31.9 % — AB (ref 36.0–46.0)
HEMOGLOBIN: 10.1 g/dL — AB (ref 12.0–15.0)
MCH: 28.1 pg (ref 26.0–34.0)
MCHC: 31.7 g/dL (ref 30.0–36.0)
MCV: 88.6 fL (ref 78.0–100.0)
Platelets: 288 10*3/uL (ref 150–400)
RBC: 3.6 MIL/uL — ABNORMAL LOW (ref 3.87–5.11)
RDW: 15.8 % — ABNORMAL HIGH (ref 11.5–15.5)
WBC: 11.3 10*3/uL — ABNORMAL HIGH (ref 4.0–10.5)

## 2017-01-19 LAB — BASIC METABOLIC PANEL
Anion gap: 10 (ref 5–15)
BUN: 21 mg/dL — AB (ref 6–20)
CHLORIDE: 106 mmol/L (ref 101–111)
CO2: 23 mmol/L (ref 22–32)
Calcium: 8.6 mg/dL — ABNORMAL LOW (ref 8.9–10.3)
Creatinine, Ser: 0.82 mg/dL (ref 0.44–1.00)
GFR calc Af Amer: >60 mL/min (ref >60)
GLUCOSE: 105 mg/dL — AB (ref 65–99)
POTASSIUM: 3.9 mmol/L (ref 3.5–5.1)
Sodium: 139 mmol/L (ref 135–145)

## 2017-01-19 LAB — FOLATE RBC
FOLATE, RBC: 1342 ng/mL (ref >498)
Folate, Hemolysate: 471.2 ng/mL
Hematocrit: 35.1 % (ref 34.0–46.6)

## 2017-01-19 LAB — MAGNESIUM: Magnesium: 1.4 mg/dL — ABNORMAL LOW (ref 1.7–2.4)

## 2017-01-19 MED ORDER — MAGNESIUM SULFATE 2 GM/50ML IV SOLN
2.0000 g | Freq: Once | INTRAVENOUS | Status: AC
  Administered 2017-01-19: 2 g via INTRAVENOUS
  Filled 2017-01-19: qty 50

## 2017-01-19 MED ORDER — OXYCODONE HCL 5 MG PO TABS
5.0000 mg | ORAL_TABLET | ORAL | Status: DC | PRN
  Administered 2017-01-20 – 2017-01-22 (×8): 5 mg via ORAL
  Filled 2017-01-19 (×8): qty 1

## 2017-01-19 MED ORDER — METOPROLOL TARTRATE 100 MG PO TABS
100.0000 mg | ORAL_TABLET | Freq: Two times a day (BID) | ORAL | Status: DC
  Administered 2017-01-19 – 2017-01-22 (×6): 100 mg via ORAL
  Filled 2017-01-19 (×6): qty 1

## 2017-01-19 MED ORDER — BUTALBITAL-APAP-CAFFEINE 50-325-40 MG PO TABS
1.0000 | ORAL_TABLET | Freq: Four times a day (QID) | ORAL | Status: DC | PRN
  Administered 2017-01-20: 1 via ORAL
  Administered 2017-01-21: 2 via ORAL
  Filled 2017-01-19: qty 1
  Filled 2017-01-19: qty 2

## 2017-01-19 MED ORDER — IBUPROFEN 200 MG PO TABS
400.0000 mg | ORAL_TABLET | ORAL | Status: DC | PRN
  Administered 2017-01-20 – 2017-01-22 (×4): 400 mg via ORAL
  Filled 2017-01-19 (×4): qty 2

## 2017-01-19 MED ORDER — HYDRALAZINE HCL 50 MG PO TABS
100.0000 mg | ORAL_TABLET | Freq: Four times a day (QID) | ORAL | Status: DC
  Administered 2017-01-20 – 2017-01-22 (×10): 100 mg via ORAL
  Filled 2017-01-19 (×10): qty 2

## 2017-01-19 MED ORDER — SIMVASTATIN 40 MG PO TABS
40.0000 mg | ORAL_TABLET | Freq: Every day | ORAL | Status: DC
Start: 2017-01-19 — End: 2017-01-22
  Administered 2017-01-19 – 2017-01-21 (×3): 40 mg via ORAL
  Filled 2017-01-19 (×3): qty 1

## 2017-01-19 MED ORDER — LABETALOL HCL 5 MG/ML IV SOLN
10.0000 mg | INTRAVENOUS | Status: DC | PRN
  Administered 2017-01-20: 10 mg via INTRAVENOUS
  Administered 2017-01-20: 5 mg via INTRAVENOUS
  Filled 2017-01-19 (×4): qty 4

## 2017-01-19 NOTE — Progress Notes (Signed)
[facility] TEAM 1 - Stepdown/ICU TEAM  [name]  MRN:[id number] DOB: 12/10/1965 DOA: 01/15/2017 PCP: ARVIND,MOOGALI M, MD    Brief Narrative:  50 y.o. F Hx Bipolar disorder, Depression, Asthma/COPD, Cavitary lesion left lung s/p VATS 12/26/2016, and Chronic low back pain who was brought to ED 2/22 from her SNF for acute visual acuity changes. In the ED the pt had a 60 sec tonic clonic seizure with a postictal state. CT brain noted a subactue non-hemorrhagic left cerebellar infarct, no acute intracranial hemorrhage, and an old left frontal infarct with encephalomalacia. MRI brain 2/23 was c/w PRES but revealed no evidence of an acute CVA.   Subjective: The patient is alert and conversant.  She is mildly confused but is able to tell me where she is and why she is here.  She complains of a severe generalized headache and asks specifically for oxycodone 15 mg to address her pain.  She reports a very poor appetite.  She denies nausea or vomiting.  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  Negative evidence of acute CVA on MRI this admit - MRI Jan 2018 noted chronic CVAs noted above, and findings to suggest SVD  HTN Blood pressure uncontrolled - adjust treatment and follow  LLL empyema/abscess S/p VATS 12/26/16 - has completed a course of abx  Prior C diff colitis  Diagnosed during last hospitalization - continues on previously prescribed oral van tx course through 01/22/17  Hypokalemia Corrected  Hypomagnesemia Replace and follow  Acute Renal failure Resolved w/ volume expansion   Recent Labs Lab 01/15/17 1738 01/16/17 0701 01/17/17 0301 01/18/17 0323 01/19/17 0400  CREATININE 1.40* 1.24* 1.18* 1.08* 0.82    COPD Well compensated at present  Bipolar Disorder Continue usual home medications  Chronic back pain w/ chronic narcotic  dependence  Use only low-dose narcotics when absolutely necessary  DVT prophylaxis: SCDs Code Status: FULL CODE Family Communication: no family present at time of exam  Disposition Plan: SDU  Consultants:  Neurology Palliative Care  Procedures: none  Antimicrobials:  Vancomycin 2/22 >   Objective: Blood pressure (!) 162/107, pulse (!) 101, temperature 98 F (36.7 C), temperature source Oral, resp. rate 19, height 5\' 2" (1.575 m), weight 73.9 kg (162 lb 14.7 oz), SpO2 100 %.  Intake/Output Summary (Last 24 hours) at 01/19/17 1638 Last data filed at 01/19/17 1139  Gross per 24 hour  Intake             1876 ml  Output             1850 ml  Net               26  ml   Filed Weights   01/18/17 0816 01/19/17 0500 01/19/17 0846  Weight: 73.5 kg (162 lb 0.6 oz) 73.6 kg (162 lb 4.1 oz) 73.9 kg (162 lb 14.7 oz)    Examination: General: No acute respiratory distress Lungs: Clear to auscultation bilaterally without wheezes or crackles Cardiovascular: Regular rate and rhythm without murmur gallop or rub normal S1 and S2 Abdomen: Nontender, nondistended, soft, bowel sounds positive, no rebound, no ascites, no appreciable mass Extremities: No significant cyanosis, clubbing, or edema bilateral lower extremities  CBC:  Recent Labs Lab 01/15/17 1730 01/15/17 1738 01/17/17 0301 01/18/17 0323 01/18/17 0812 01/19/17 0400  WBC 14.8*  --  12.2* 9.0  --  11.3*  NEUTROABS 11.6*  --   --   --   --   --   HGB 11.5* 12.9 11.1* 10.9*  --  10.1*  HCT 35.9* 38.0 34.0* 34.1* 35.1 31.9*  MCV 89.8  --  86.7 87.2  --  88.6  PLT 504*  --  360 334  --  [id number]   Basic Metabolic Panel:  Recent Labs Lab 01/15/17 1730 01/15/17 1738 01/16/17 0701 01/17/17 0301 01/18/17 0323 01/19/17 0400  NA 139 135 139 138 138 139  K 2.9* 2.9* 3.0* 2.9* 4.1 3.9  CL 86* 87* 90* 90* 95* 106  CO2 30  --  34* 30 28 23   GLUCOSE 156* 161* 107* 118* 110* 105*  BUN 13 12 9 10 15  21*  CREATININE 1.65* 1.40* 1.24*  1.18* 1.08* 0.82  CALCIUM 9.3  --  8.2* 8.7* 8.8* 8.6*  MG 1.2*  --   --  1.8 2.1 1.4*   GFR: Estimated Creatinine Clearance: 76.4 mL/min (by C-G formula based on SCr of 0.82 mg/dL).  Liver Function Tests:  Recent Labs Lab 01/15/17 1730 01/16/17 0701  AST 30 20  ALT 15 11*  ALKPHOS 128* 113  BILITOT 0.7 0.9  PROT 7.6 6.2*  ALBUMIN 3.3* 2.7*    Coagulation Profile:  Recent Labs Lab 01/15/17 1730  INR 1.09    HbA1C: Hgb A1c MFr Bld  Date/Time Value Ref Range Status  01/16/2017 07:01 AM 5.4 4.8 - 5.6 % Final    Comment:    (NOTE)         Pre-diabetes: 5.7 - 6.4         Diabetes: >6.4         Glycemic control for adults with diabetes: <7.0   12/13/2016 06:13 AM 5.4 4.8 - 5.6 % Final    Comment:    (NOTE)         Pre-diabetes: 5.7 - 6.4         Diabetes: >6.4         Glycemic control for adults with diabetes: <7.0     CBG:  Recent Labs Lab 01/18/17 1940 01/19/17 0040 01/19/17 0408 01/19/17 0833 01/19/17 1142  GLUCAP 108* 89 103* 102* 115*    Recent Results (from the past 240 hour(s))  MRSA PCR Screening     Status: None   Collection Time: 01/16/17  1:13 AM  Result Value Ref Range Status   MRSA by PCR NEGATIVE NEGATIVE Final    Comment:        The GeneXpert MRSA Assay (FDA approved for NASAL specimens only), is one component of a comprehensive MRSA colonization surveillance program. It is not intended to diagnose MRSA infection nor to guide or monitor treatment for MRSA infections.   Culture, Urine     Status: None   Collection Time: 01/16/17  2:32 PM  Result Value Ref Range Status   Specimen Description URINE, CATHETERIZED  Final   Special Requests NONE  Final   Culture NO GROWTH  Final   Report Status 01/17/2017 FINAL  Final  Culture, blood (routine x 2)     Status: None (Preliminary result)   Collection Time: 01/16/17  3:51 PM  Result Value Ref Range Status   Specimen Description BLOOD LEFT ARM  Final   Special Requests IN PEDIATRIC  BOTTLE 3CC  Final   Culture NO GROWTH 3 DAYS  Final   Report Status PENDING  Incomplete  Culture, blood (routine x 2)     Status:  None (Preliminary result)   Collection Time: 01/16/17  3:51 PM  Result Value Ref Range Status   Specimen Description BLOOD LEFT ARM  Final   Special Requests IN PEDIATRIC BOTTLE 2CC  Final   Culture NO GROWTH 3 DAYS  Final   Report Status PENDING  Incomplete     Scheduled Meds: . budesonide  0.5 mg Nebulization BID  . hydrALAZINE  50 mg Oral Q6H  . levETIRAcetam  1,000 mg Intravenous Q12H  . metoprolol tartrate  50 mg Oral BID  . pantoprazole (PROTONIX) IV  40 mg Intravenous Daily  . potassium chloride  60 mEq Per Tube BID  . QUEtiapine  100 mg Oral QHS  . simvastatin  40 mg Per Tube q1800  . vancomycin  250 mg Oral Q6H     LOS: 4 days   Cherene Altes, MD [facility] Office  [phone] Pager - Text Page per Amion as per below:  On-Call/Text Page:      [email or web address]      password [email or web address]  If 7PM-7AM, please contact night-coverage [email or web address] Password [facility] 01/19/2017, 4:38 PM

## 2017-01-19 NOTE — Evaluation (Signed)
Clinical/Bedside Swallow Evaluation Patient Details  Name: Darlina CHRISTAIN BERMUDES MRN: [id number] Date of Birth: 1966/10/17  Today's Date: 01/19/2017 Time: SLP Start Time (ACUTE ONLY): 1100 SLP Stop Time (ACUTE ONLY): 1112 SLP Time Calculation (min) (ACUTE ONLY): 12 min  Past Medical History:  Past Medical History:  Diagnosis Date  . Allergic rhinitis   . Arthritis   . Asthma   . Back pain   . Bipolar 1 disorder ([place])   . C. difficile diarrhea   . Cavitary lesion of lung   . Chronic low back pain 03/06/2014  . COPD (chronic obstructive pulmonary disease) ([place])   . Depression   . Fibroid   . Hypertension   . Hypomagnesemia   . IBS (irritable bowel syndrome)   . Migraine   . Scoliosis    Past Surgical History:  Past Surgical History:  Procedure Laterality Date  . CHOLECYSTECTOMY    . DILATION AND CURETTAGE OF UTERUS    . EXPLORATORY LAPAROTOMY     WHEN PATIENT WAS YOUNG  . RIGHT FOOT SURGERY    . TUBAL LIGATION Bilateral   . VIDEO ASSISTED THORACOSCOPY (VATS)/DECORTICATION Left 12/26/2016   Procedure: LEFT VIDEO ASSISTED THORACOSCOPY (VATS)/DECORTICATION;  Surgeon: Ivin Poot, MD;  Location: [facility];  Service: Thoracic;  Laterality: Left;  Marland Kitchen VIDEO BRONCHOSCOPY N/A 12/26/2016   Procedure: VIDEO BRONCHOSCOPY WITH BRONCHIAL LAVAGE;  Surgeon: Ivin Poot, MD;  Location: [facility];  Service: Thoracic;  Laterality: N/A;   HPI:  Dewaris a 51 y.o. WFPMHxBipolar 1 disorder,Depression, w hx of Asthma/Copd, Cavitary lesion left lung, Vats 12/26/2016, Chronic low back pain, apparently was at [facility]. Brought to ED for ? Vision changes. Pt has baseline confusion. In ED, pt had brief 60 sec, tonic clonic seizure with postictal state. MRI brain 12/14/2016=>small cluster of acute infarctions in the left cerebellum, no mass effect or hemorrhage. 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 CXR showed stable left cavitary area. Pt had a history of acute reversible dysphagia following  prolonged intubation last admit. D/c'd on regular diet with thin liquids.    Assessment / Plan / Recommendation Clinical Impression  Pt demosntrates no signs of aspiration with 3 oz of water given consecutively over 3 trials. Pt is alert and able to feed herself solids without difficulty. Recommend pt resume a regular diet and thin liquids. No SLP f/u needed for swallowing and will defer cognitive linguitic eval as pt seems close to baseline observed last admission and has a history of cognitive deficits.  SLP Visit Diagnosis: Dysphagia, unspecified (R13.10)    Aspiration Risk  Mild aspiration risk    Diet Recommendation Regular;Thin liquid   Liquid Administration via: Cup;Straw Medication Administration: Whole meds with liquid Supervision: Patient able to self feed Compensations: Minimize environmental distractions Postural Changes: Seated upright at 90 degrees    Other  Recommendations Oral Care Recommendations: Oral care BID   Follow up Recommendations None      Frequency and Duration            Prognosis        Swallow Study   General HPI: Dewaris a 51 y.o. WFPMHxBipolar 1 disorder,Depression, w hx of Asthma/Copd, Cavitary lesion left lung, Vats 12/26/2016, Chronic low back pain, apparently was at [facility]. Brought to ED for ? Vision changes. Pt has baseline confusion. In ED, pt had brief 60 sec, tonic clonic seizure with postictal state. MRI brain 12/14/2016=>small cluster of acute infarctions in the left cerebellum, no mass effect or hemorrhage. Old  inferior left frontal cortical and white matter infarct. CXR showed stable left cavitary area. Pt had a history of acute reversible dysphagia following prolonged intubation last admit. D/c'd on regular diet with thin liquids.  Type of Study: Bedside Swallow Evaluation Previous Swallow Assessment: see hpi Diet Prior to this Study: NPO;NG Tube Respiratory Status: Room air History of Recent Intubation: No Behavior/Cognition:  Alert;Cooperative Oral Care Completed by SLP: No Oral Cavity - Dentition: Missing dentition Vision: Functional for self-feeding Self-Feeding Abilities: Able to feed self Baseline Vocal Quality: Normal Volitional Cough: Strong    Oral/Motor/Sensory Function Overall Oral Motor/Sensory Function: Within functional limits   Ice Chips     Thin Liquid Thin Liquid: Within functional limits Presentation: Cup;Straw Other Comments: belching    Nectar Thick Nectar Thick Liquid: Not tested   Honey Thick Honey Thick Liquid: Not tested   Puree Puree: Within functional limits   Solid   GO   Solid: Within functional limits       [name], MA CCC-SLP [id number]  Amrie Gurganus, Katherene Ponto 01/19/2017,11:25 AM

## 2017-01-19 NOTE — Progress Notes (Signed)
Subjective: Patient has no complaints at this time. She is still very slow to respond. She is aware that she is in the hospital and had a seizure. She is not aware that it might of been due to hyperglycemia and hypertension.  Exam: Vitals:   01/19/17 0800 01/19/17 0903  BP:  (!) 150/88  Pulse:  (!) 101  Resp:    Temp: 98.6 F (37 C)     HEENT-  Normocephalic, no lesions, without obvious abnormality.  Normal external eye and conjunctiva.  Normal TM's bilaterally.  Normal auditory canals and external ears. Normal external nose, mucus membranes and septum.  Normal pharynx.     Gen: In bed, NAD MS: Patient is awake and alert. She notes she is in the hospital and the month is February. She is able to follow simple commands but is very slow in doing so. Patient remains encephalopathic CN: Cranial nerves II through XII are grossly intact Motor: Moving all extremities antigravity with good strength 4/5-5/5 some difficulty following commands however. Sensory: Grimaces to pain throughout DTR: 2+ throughout  Pertinent Labs/Diagnostics: C. difficile ending Recent glucose 102 White blood cell count 11.3 Blood pressure still elevated However much improved from initial blood pressures. Most recent blood pressures of 150/88, 150/84, 162/97, 155/101  Etta Quill PA-C [facility] [phone]  Impression: 51 year old female with acute encephalopathy and new onset seizure in the setting of PRES, and possible C. Difficile. She is improving. Her BP continues to be elevated and treatment for this is BP control.    Recommendations: 1) Have added PRN labetalol for SBP > 150 2) will continue to follow.   Roland Rack, MD [facility] [phone]  If 7pm- 7am, please page neurology on call as listed in [place].     01/19/2017, 9:23 AM

## 2017-01-19 NOTE — Care Management Note (Signed)
Case Management Note  Patient Details  Name: [name] MRN: [id number] Date of Birth: June 18, 1966  Subjective/Objective:    Pt lives with daughter and grandson, was @ SNF for rehab PTA.  Remains on IVF and IV Keppra.  Anticipate pt will return to SNF when medically stable for discharge.                       Expected Discharge Plan:  Skilled Nursing Facility  In-House Referral:  Clinical Social Work  Discharge planning Services  CM Consult  Status of Service:  In process, will continue to follow  Girard Cooter, RN 01/19/2017, 10:12 AM

## 2017-01-20 DIAGNOSIS — N170 Acute kidney failure with tubular necrosis: Secondary | ICD-10-CM

## 2017-01-20 LAB — BASIC METABOLIC PANEL
ANION GAP: 12 (ref 5–15)
BUN: 11 mg/dL (ref 6–20)
CHLORIDE: 106 mmol/L (ref 101–111)
CO2: 21 mmol/L — ABNORMAL LOW (ref 22–32)
Calcium: 9.3 mg/dL (ref 8.9–10.3)
Creatinine, Ser: 0.7 mg/dL (ref 0.44–1.00)
GFR calc Af Amer: >60 mL/min (ref >60)
Glucose, Bld: 92 mg/dL (ref 65–99)
POTASSIUM: 3.8 mmol/L (ref 3.5–5.1)
SODIUM: 139 mmol/L (ref 135–145)

## 2017-01-20 LAB — GLUCOSE, CAPILLARY: GLUCOSE-CAPILLARY: 141 mg/dL — AB (ref 65–99)

## 2017-01-20 LAB — CBC
HEMATOCRIT: 32.7 % — AB (ref 36.0–46.0)
HEMOGLOBIN: 10.6 g/dL — AB (ref 12.0–15.0)
MCH: 28.7 pg (ref 26.0–34.0)
MCHC: 32.4 g/dL (ref 30.0–36.0)
MCV: 88.6 fL (ref 78.0–100.0)
Platelets: 294 10*3/uL (ref 150–400)
RBC: 3.69 MIL/uL — AB (ref 3.87–5.11)
RDW: 16.1 % — ABNORMAL HIGH (ref 11.5–15.5)
WBC: 11.5 10*3/uL — AB (ref 4.0–10.5)

## 2017-01-20 LAB — MAGNESIUM: MAGNESIUM: 1.8 mg/dL (ref 1.7–2.4)

## 2017-01-20 MED ORDER — ENOXAPARIN SODIUM 40 MG/0.4ML ~~LOC~~ SOLN
40.0000 mg | SUBCUTANEOUS | Status: DC
  Administered 2017-01-20 – 2017-01-21 (×2): 40 mg via SUBCUTANEOUS
  Filled 2017-01-20 (×3): qty 0.4

## 2017-01-20 NOTE — Progress Notes (Signed)
Subjective: Complaining of bifrontal HA. BP have remained stable over night. No further seizures.   Exam: Vitals:   01/20/17 0727 01/20/17 0907  BP: (!) 144/90 137/80  Pulse: (!) 104   Resp: (!) 24   Temp: 99 F (37.2 C)     HEENT-  Normocephalic, no lesions, without obvious abnormality.  Normal external eye and conjunctiva.  Normal TM's bilaterally.  Normal auditory canals and external ears. Normal external nose, mucus membranes and septum.  Normal pharynx.  Gen: In bed, NAD MS: Patient is awake and alert. She notes she is in the hospital and the month is FebruaryIn the years 2018. She is also able to tell me that in March and the holiday will be [place] day.. She is able to follow simple commands but is very slow in doing so at this point most likely secondary to whole cranial headache. She continues to keep her eyes closed as she says the light is bothering her. CN: Cranial nerves II through XII are grossly intact Motor: Moving all extremities antigravity with good strength 4/5-5/5 some difficulty following commands however. Sensory:  sensation grossly intact throughout DTR: 2+ throughout    Pertinent Labs/Diagnostics: Patient is seated positive Blood pressures have remained below [id number] systolic with a recent: [id number], 128/85, 144/92, 144/92  Etta Quill PA-C [facility] [phone]  She continues to  mild difficulty with finger counting in the right field, but is able to see movement.   Impression: 51 year old female with ac te encephalopathy and new onset seizure in the setting of PRES, and C. Difficile. She is improving. Her BP is been under control the last 24 hours.  Recommendations: 1) Have added PRN labetalol for SBP > 150 which seems to be helping 2) headache management. Patient has migraines in the past may consider migraine cocktail.  Roland Rack, MD [facility] [phone]  If 7pm- 7am, please page neurology on call as  listed in [place].  01/20/2017, 9:44 AM

## 2017-01-20 NOTE — Progress Notes (Signed)
Daily Progress Note   Patient Name: [name]       Date: 01/20/2017 DOB: 03-17-1966  Age: 51 y.o. MRN#: [id number] Attending Physician: Reyne Dumas, MD Primary Care Physician: [name], MD Admit Date: 01/15/2017  Reason for Consultation/Follow-up: Establishing goals of care given PRES/seizure, recent empyema, recent CVAs  Subjective: Patient awake, but eyes closed.  She answered several questions appropriately - told me she was from [place], had a headache behind her eyes, and that she was hungry.  She spoke slowly but clearly.  Followed commands.  She did not initiate any conversation or questions.  I spoke with [name] on the phone.  It seems her mother is improved as [name] says she was not speaking at all previously.  Patient Profile/HPI: 51 y.o. female  with past medical history of asthma, COPD, bipolar d/o admitted on 01/15/2017 with visual changes. This is the second admission in the last two months. Patient was admitted previously 12/12/16 with AMS, SOB found to have multilobar pneumonia, resp failure requiring intubation. During that admission she was found to have cavitary lesion with MRSA + empyema and underwent VATS. She was extubated, her mental status and respiratory status improved and she was d/c'd to [facility] SNF on  2/19. 2/22 She presented to ED with complaints of not being able to see. She then developed seizure in the ED. Further workup thus far has revealed likely PRES (current tx is to control BP) resulting in encephalopathy and seizures. She remains with altered mental status. Palliative medicine consulted to discuss code status and GOC.  Length of Stay: 5  Current Medications: Scheduled Meds:  . budesonide  0.5 mg Nebulization BID  . hydrALAZINE  100 mg  Oral Q6H  . levETIRAcetam  1,000 mg Intravenous Q12H  . metoprolol tartrate  100 mg Oral BID  . QUEtiapine  100 mg Oral QHS  . simvastatin  40 mg Oral q1800  . vancomycin  250 mg Oral Q6H    Continuous Infusions: . sodium chloride 50 mL/hr (01/19/17 1856)    PRN Meds: acetaminophen **OR** [DISCONTINUED] acetaminophen (TYLENOL) oral liquid 160 mg/5 mL **OR** acetaminophen, butalbital-acetaminophen-caffeine, hydrALAZINE, ibuprofen, ipratropium-albuterol, labetalol, LORazepam, oxyCODONE  Physical Exam        Well developed female.  Eyes closed complaining of a headache. CV rrr, no m/r/g resp no  distress, no w/c/r Abdomen soft, +BS Extremities, able to move all 4 on command, no edema.   Vital Signs: BP 137/80   Pulse (!) 104   Temp 99 F (37.2 C) (Oral)   Resp (!) 24   Ht 5\' 2"  (1.575 m)   Wt 73.1 kg (161 lb 2.5 oz)   SpO2 94%   BMI 29.48 kg/m  SpO2: SpO2: 94 % O2 Device: O2 Device: Not Delivered O2 Flow Rate: O2 Flow Rate (L/min): 2 L/min  Intake/output summary:   Intake/Output Summary (Last 24 hours) at 01/20/17 [id number] Last data filed at 01/20/17 0751  Gross per 24 hour  Intake          2288.33 ml  Output             2725 ml  Net          -436.67 ml   LBM: Last BM Date: 01/20/17 Baseline Weight: Weight: 73 kg (160 lb 15 oz) Most recent weight: Weight: 73.1 kg (161 lb 2.5 oz)       Palliative Assessment/Data:    Flowsheet Rows   Flowsheet Row Most Recent Value  Intake Tab  Referral Department  Hospitalist  Unit at Time of Referral  Med/Surg Unit  Palliative Care Primary Diagnosis  Neurology  Date Notified  01/16/17  Palliative Care Type  New Palliative care  Reason for referral  Clarify Goals of Care  Date of Admission  01/15/17  Date first seen by Palliative Care  01/17/17  # of days Palliative referral response time  1 Day(s)  # of days IP prior to Palliative referral  1  Clinical Assessment  Psychosocial & Spiritual Assessment  Palliative Care  Outcomes      Patient Active Problem List   Diagnosis Date Noted  . Palliative care by specialist   . Goals of care, counseling/discussion   . Acute renal failure ([place])   . Bipolar I disorder ([place])   . Chronic pain syndrome   . PRES (posterior reversible encephalopathy syndrome)   . Acute encephalopathy   . Visual disturbance   . Seizure ([place]) 01/15/2017  . Hypokalemia 01/15/2017  . Renal insufficiency 01/15/2017  . Hyperglycemia 01/15/2017  . H/O chest tube placement   . Candida infection   . Coagulase-negative staphylococcal infection   . Asthma 01/03/2017  . Seasonal allergies 01/03/2017  . Anemia 01/03/2017  . Herpes labialis 01/03/2017  . Empyema of pleural space ([place]) 12/26/2016  . Abscess of lower lobe of left lung with pneumonia ([place])   . Ileus ([place])   . Pneumothorax   . Pneumonia of left upper lobe due to Streptococcus pneumoniae ([place])   . Clostridium difficile colitis   . Acute kidney injury ([place])   . HCAP (healthcare-associated pneumonia)   . Transaminitis   . Pressure injury of skin 12/19/2016  . MDD (major depressive disorder), recurrent severe, without psychosis ([place]) 12/22/2015  . Acute respiratory failure ([place]) 12/13/2015  . IBS (irritable bowel syndrome) 12/13/2015  . COPD with acute exacerbation ([place])   . Essential hypertension   . Encephalopathy acute 12/03/2015  . Chronic narcotic dependence ([place]) 11/22/2015  . Hypercalcemia 11/22/2015  . Chronic hyponatremia 03/07/2015  . Cigarette nicotine dependence without complication [id number]  . Fibromyalgia syndrome 01/09/2015  . Mixed hyperlipidemia 01/09/2015  . Chronic low back pain 03/06/2014    Palliative Care Plan    Recommendations/Plan:  PMT will continue to follow intermittently for progression, and continue to engage family as needed.  Continue current  care.  Goals of Care and Additional Recommendations:  Limitations on Scope of Treatment: Full Scope Treatment  Code Status:  Full  code  Prognosis:   Unable to determine   Discharge Planning:  SNF   Thank you for allowing the Palliative Medicine Team to assist in the care of this patient.  Total time spent:  15 min.     Greater than 50%  of this time was spent counseling and coordinating care related to the above assessment and plan.  Imogene Burn, PA-C Palliative Medicine  Please contact Palliative MedicineTeam phone at [phone] for questions and concerns between 7 am - 7 pm.   Please see AMION for individual provider pager numbers.

## 2017-01-20 NOTE — Progress Notes (Signed)
[place] TEAM 1 - Stepdown/ICU TEAM  [name]  MRN:[id number] DOB: 07/25/1966 DOA: 01/15/2017 PCP: ARVIND,MOOGALI M, MD    Brief Narrative:  50 y.o. F Hx Bipolar disorder, Depression, Asthma/COPD, Cavitary lesion left lung s/p VATS 12/26/2016, and Chronic low back pain who was brought to ED 2/22 from her SNF for acute visual acuity changes. In the ED the pt had a 60 sec tonic clonic seizure with a postictal state. CT brain noted a subactue non-hemorrhagic left cerebellar infarct, no acute intracranial hemorrhage, and an old left frontal infarct with encephalomalacia. MRI brain 2/23 was c/w PRES but revealed no evidence of an acute CVA.   This is the second admission in the last two months. Patient was admitted previously 12/12/16 with AMS, SOB found to have multilobar pneumonia, resp failure requiring intubation. During that admission she was found to have cavitary lesion with MRSA + empyema and underwent VATS. She was extubated, her mental status and respiratory status improved and she was d/c\'d to [facility] on  2/19   Subjective: The patient is alert and conversant. Still having diarrhea    Assessment & Plan:  Acute encephalopathy/PRES / Seizure likely secondary to PRES  Neurology following - appears to be slowly improving, as would be expected  Needs aggressive treatment of blood pressure Neurology recommends Keppra for 3 months. If she remains seizure-free could discontinue it at that point.  Avoid CNS active meds, particularly benzos, opiates, and anything with strong anticholinergic properties.  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  Negative evidence of acute CVA on MRI this admit - MRI Jan 2018 noted chronic CVAs noted above, and findings to suggest SVD  HTN Blood pressure uncontrolled - adjust  treatment and follow PRN labetalol for SBP > 150  LLL empyema/abscess S/p VATS 12/26/16 - has completed a course of abx Path report negative for underlying malignancy   Prior C diff colitis  Diagnosed during last hospitalization - continues on previously prescribed oral van tx course through 01/22/17  Hypokalemia Corrected  Hypomagnesemia Replace and follow  Acute Renal failure Resolved w/ volume expansion   Recent Labs Lab 01/16/17 0701 01/17/17 0301 01/18/17 0323 01/19/17 0400 01/20/17 0313  CREATININE 1.24* 1.18* 1.08* 0.82 0.70    COPD Well compensated at present  Bipolar Disorder Continue usual home medications  Chronic back pain w/ chronic narcotic dependence  Use only low-dose narcotics when absolutely necessary    DVT prophylaxis: SCDs, start Lovenox Code Status: FULL CODE Family Communication: no family present at time of exam  Disposition Plan: tx to tele   Consultants:  Neurology Palliative Care  Procedures: none  Antimicrobials:  Vancomycin 2/22 >   Objective: Blood pressure 137/80, pulse (!) 104, temperature 99 F (37.2 C), temperature source Oral, resp. rate (!) 24, height 5\' 2" (1.575 m), weight 73.1 kg (161 lb 2.5 oz), SpO2 94 %.  Intake/Output Summary (Last 24 hours) at 01/20/17 0927 Last data filed at 01/20/17 0751  Gross per 24 hour  Intake          2288.33 ml  Output             27 25 ml  Net          -436.67 ml   Filed Weights   01/19/17 0500 01/19/17 0846 01/20/17 0432  Weight: 73.6 kg (162 lb  4.1 oz) 73.9 kg (162 lb 14.7 oz) 73.1 kg (161 lb 2.5 oz)    Examination: General: No acute respiratory distress Lungs: Clear to auscultation bilaterally without wheezes or crackles Cardiovascular: Regular rate and rhythm without murmur gallop or rub normal S1 and S2 Abdomen: Nontender, nondistended, soft, bowel sounds positive, no rebound, no ascites, no appreciable mass Extremities: No significant cyanosis, clubbing, or edema  bilateral lower extremities  CBC:  Recent Labs Lab 01/15/17 1730 01/15/17 1738 01/17/17 0301 01/18/17 0323 01/18/17 0812 01/19/17 0400 01/20/17 0313  WBC 14.8*  --  12.2* 9.0  --  11.3* 11.5*  NEUTROABS 11.6*  --   --   --   --   --   --   HGB 11.5* 12.9 11.1* 10.9*  --  10.1* 10.6*  HCT 35.9* 38.0 34.0* 34.1* 35.1 31.9* 32.7*  MCV 89.8  --  86.7 87.2  --  88.6 88.6  PLT 504*  --  360 334  --  288 [id number]   Basic Metabolic Panel:  Recent Labs Lab 01/15/17 1730  01/16/17 0701 01/17/17 0301 01/18/17 0323 01/19/17 0400 01/20/17 0313  NA 139  < > 139 138 138 139 139  K 2.9*  < > 3.0* 2.9* 4.1 3.9 3.8  CL 86*  < > 90* 90* 95* 106 106  CO2 30  --  34* 30 28 23  21*  GLUCOSE 156*  < > 107* 118* 110* 105* 92  BUN 13  < > 9 10 15  21* 11  CREATININE 1.65*  < > 1.24* 1.18* 1.08* 0.82 0.70  CALCIUM 9.3  --  8.2* 8.7* 8.8* 8.6* 9.3  MG 1.2*  --   --  1.8 2.1 1.4* 1.8  < > = values in this interval not displayed. GFR: Estimated Creatinine Clearance: 77.9 mL/min (by C-G formula based on SCr of 0.7 mg/dL).  Liver Function Tests:  Recent Labs Lab 01/15/17 1730 01/16/17 0701  AST 30 20  ALT 15 11*  ALKPHOS 128* 113  BILITOT 0.7 0.9  PROT 7.6 6.2*  ALBUMIN 3.3* 2.7*    Coagulation Profile:  Recent Labs Lab 01/15/17 1730  INR 1.09    HbA1C: Hgb A1c MFr Bld  Date/Time Value Ref Range Status  01/16/2017 07:01 AM 5.4 4.8 - 5.6 % Final    Comment:    (NOTE)         Pre-diabetes: 5.7 - 6.4         Diabetes: >6.4         Glycemic control for adults with diabetes: <7.0   12/13/2016 06:13 AM 5.4 4.8 - 5.6 % Final    Comment:    (NOTE)         Pre-diabetes: 5.7 - 6.4         Diabetes: >6.4         Glycemic control for adults with diabetes: <7.0     CBG:  Recent Labs Lab 01/19/17 0408 01/19/17 0833 01/19/17 1142 01/19/17 1638 01/19/17 1935  GLUCAP 103* 102* 115* 102* 97    Recent Results (from the past 240 hour(s))  MRSA PCR Screening     Status: None    Collection Time: 01/16/17  1:13 AM  Result Value Ref Range Status   MRSA by PCR NEGATIVE NEGATIVE Final    Comment:        The GeneXpert MRSA Assay (FDA approved for NASAL specimens only), is one component of a comprehensive MRSA colonization surveillance program. It is not intended to diagnose  MRSA infection nor to guide or monitor treatment for MRSA infections.   Culture, Urine     Status: None   Collection Time: 01/16/17  2:32 PM  Result Value Ref Range Status   Specimen Description URINE, CATHETERIZED  Final   Special Requests NONE  Final   Culture NO GROWTH  Final   Report Status 01/17/2017 FINAL  Final  Culture, blood (routine x 2)     Status: None (Preliminary result)   Collection Time: 01/16/17  3:51 PM  Result Value Ref Range Status   Specimen Description BLOOD LEFT ARM  Final   Special Requests IN PEDIATRIC BOTTLE 3CC  Final   Culture NO GROWTH 3 DAYS  Final   Report Status PENDING  Incomplete  Culture, blood (routine x 2)     Status: None (Preliminary result)   Collection Time: 01/16/17  3:51 PM  Result Value Ref Range Status   Specimen Description BLOOD LEFT ARM  Final   Special Requests IN PEDIATRIC BOTTLE 2CC  Final   Culture NO GROWTH 3 DAYS  Final   Report Status PENDING  Incomplete     Scheduled Meds: . budesonide  0.5 mg Nebulization BID  . hydrALAZINE  100 mg Oral Q6H  . levETIRAcetam  1,000 mg Intravenous Q12H  . metoprolol tartrate  100 mg Oral BID  . QUEtiapine  100 mg Oral QHS  . simvastatin  40 mg Oral q1800  . vancomycin  250 mg Oral Q6H     LOS: 5 days     [facility] Office  [phone] Pager - Text Page per Shea Evans as per below:    If 7PM-7AM, please contact night-coverage [email or web address] Password [facility] 01/20/2017, 9:27 AM

## 2017-01-20 NOTE — Progress Notes (Signed)
CSW informed pt from [facility] (was there for 3 days prior to readmit)- CSW attempted to call pt dtr to discuss plan for pt at time of DC- left voicemail  CSW will continue to follow  [name], [place] Worker [phone]

## 2017-01-20 NOTE — NC FL2 (Signed)
[place] LEVEL OF CARE SCREENING TOOL     IDENTIFICATION  Patient Name: [name] Birthdate: October 24, 1966 Sex: female Admission Date (Current Location): 01/15/2017  [facility] and [place] Number:  Herbalist and Address:  [facility], [place] [address], [place], [place] [address]      Provider Number: [id number]  Attending Physician Name and Address:  Reyne Dumas, MD  Relative Name and Phone Number:       Current Level of Care: Hospital Recommended Level of Care: [place] Prior Approval Number:    Date Approved/Denied:   PASRR Number: [id number] (expires 02/08/17)  Discharge Plan: SNF    Current Diagnoses: Patient Active Problem List   Diagnosis Date Noted  . Palliative care by specialist   . Goals of care, counseling/discussion   . Acute renal failure ([place])   . Bipolar I disorder ([place])   . Chronic pain syndrome   . PRES (posterior reversible encephalopathy syndrome)   . Acute encephalopathy   . Visual disturbance   . Seizure ([place]) 01/15/2017  . Hypokalemia 01/15/2017  . Renal insufficiency 01/15/2017  . Hyperglycemia 01/15/2017  . H/O chest tube placement   . Candida infection   . Coagulase-negative staphylococcal infection   . Asthma 01/03/2017  . Seasonal allergies 01/03/2017  . Anemia 01/03/2017  . Herpes labialis 01/03/2017  . Empyema of pleural space ([place]) 12/26/2016  . Abscess of lower lobe of left lung with pneumonia ([place])   . Ileus ([place])   . Pneumothorax   . Pneumonia of left upper lobe due to Streptococcus pneumoniae ([place])   . Clostridium difficile colitis   . Acute kidney injury ([place])   . HCAP (healthcare-associated pneumonia)   . Transaminitis   . Pressure injury of skin 12/19/2016  . MDD (major depressive disorder), recurrent severe, without psychosis ([place]) 12/22/2015  . Acute respiratory failure ([place]) 12/13/2015  . IBS (irritable bowel syndrome) 12/13/2015  . COPD with acute  exacerbation ([place])   . Essential hypertension   . Encephalopathy acute 12/03/2015  . Chronic narcotic dependence ([place]) 11/22/2015  . Hypercalcemia 11/22/2015  . Chronic hyponatremia 03/07/2015  . Cigarette nicotine dependence without complication [id number]  . Fibromyalgia syndrome 01/09/2015  . Mixed hyperlipidemia 01/09/2015  . Chronic low back pain 03/06/2014    Orientation RESPIRATION BLADDER Height & Weight     Self, Situation, Place  Normal Continent Weight: 161 lb 2.5 oz (73.1 kg) Height:  5\' 2"  (157.5 cm)  BEHAVIORAL SYMPTOMS/MOOD NEUROLOGICAL BOWEL NUTRITION STATUS      Continent Diet (see DC summary)  AMBULATORY STATUS COMMUNICATION OF NEEDS Skin   Extensive Assist Verbally Other (Comment), PU Stage and Appropriate Care (unstageable upper lip)   PU Stage 2 Dressing:  (buttocks, foam dressing)                   Personal Care Assistance Level of Assistance  Bathing, Dressing Bathing Assistance: Maximum assistance   Dressing Assistance: Maximum assistance     Functional Limitations Info             SPECIAL CARE FACTORS FREQUENCY  PT (By licensed PT), OT (By licensed OT)     PT Frequency: 3/wk OT Frequency: 3/wk            Contractures      Additional Factors Info  Code Status, Allergies, Isolation Precautions, Psychotropic Code Status Info: FULL Allergies Info: Cymbalta Duloxetine Hcl, Mucinex Guaifenesin Er, Nyquil Multi-symptom Pseudoeph-doxylamine-dm-apap, Paxil Paroxetine Hcl,  Promethazine, Tramadol, Wellbutrin Bupropion, Zoloft Sertraline Psychotropic Info: seroquel   Isolation Precautions Info: Enteric     Current Medications (01/20/2017):  This is the current hospital active medication list Current Facility-Administered Medications  Medication Dose Route Frequency Provider Last Rate Last Dose  . 0.9 %  sodium chloride infusion   Intravenous Continuous Cherene Altes, MD 50 mL/hr at 01/19/17 1856 50 mL/hr at 01/19/17 1856  .  acetaminophen (TYLENOL) tablet 650 mg  650 mg Oral Q4H PRN Jani Gravel, MD   650 mg at 01/19/17 1643   Or  . acetaminophen (TYLENOL) suppository 650 mg  650 mg Rectal Q4H PRN Jani Gravel, MD      . budesonide (PULMICORT) nebulizer solution 0.5 mg  0.5 mg Nebulization BID Jani Gravel, MD   0.5 mg at 01/20/17 0900  . butalbital-acetaminophen-caffeine (FIORICET, ESGIC) 50-325-40 MG per tablet 1-2 tablet  1-2 tablet Oral Q6H PRN Cherene Altes, MD      . enoxaparin (LOVENOX) injection 40 mg  40 mg Subcutaneous Q24H Reyne Dumas, MD   40 mg at 01/20/17 1004  . hydrALAZINE (APRESOLINE) injection 10 mg  10 mg Intravenous Q6H PRN Allie Bossier, MD   10 mg at 01/19/17 1457  . hydrALAZINE (APRESOLINE) tablet 100 mg  100 mg Oral Q6H Cherene Altes, MD   100 mg at 01/20/17 [phone]  . ibuprofen (ADVIL,MOTRIN) tablet 400 mg  400 mg Oral Q4H PRN Cherene Altes, MD      . ipratropium-albuterol (DUONEB) 0.5-2.5 (3) MG/3ML nebulizer solution 3 mL  3 mL Nebulization Q6H PRN Jani Gravel, MD      . labetalol (NORMODYNE,TRANDATE) injection 10 mg  10 mg Intravenous Q10 min PRN Greta Doom, MD   5 mg at 01/20/17 1004  . levETIRAcetam (KEPPRA) 1,000 mg in sodium chloride 0.9 % 100 mL IVPB  1,000 mg Intravenous Q12H Jani Gravel, MD   1,000 mg at 01/20/17 0059  . LORazepam (ATIVAN) injection 2 mg  2 mg Intravenous Q4H PRN Jani Gravel, MD   2 mg at 01/19/17 1643  . metoprolol (LOPRESSOR) tablet 100 mg  100 mg Oral BID Cherene Altes, MD   100 mg at 01/20/17 0907  . oxyCODONE (Oxy IR/ROXICODONE) immediate release tablet 5 mg  5 mg Oral Q4H PRN Cherene Altes, MD   5 mg at 01/20/17 1004  . QUEtiapine (SEROQUEL) tablet 100 mg  100 mg Oral QHS Jani Gravel, MD   100 mg at 01/19/17 2059  . simvastatin (ZOCOR) tablet 40 mg  40 mg Oral q1800 Cherene Altes, MD   40 mg at 01/19/17 1854  . vancomycin (VANCOCIN) 50 mg/mL oral solution 250 mg  250 mg Oral Q6H Jani Gravel, MD   250 mg at 01/20/17 [id number]     Discharge  Medications: Please see discharge summary for a list of discharge medications.  Relevant Imaging Results:  Relevant Lab Results:   Additional Information SSN: [id number]  [name], [place]

## 2017-01-20 NOTE — Progress Notes (Signed)
Nutrition Follow Up  DOCUMENTATION CODES:   Not applicable  INTERVENTION:    Continue Regular diet   NEW NUTRITION DIAGNOSIS:   Increased nutrient needs related to wound healing as evidenced by estimated needs, ongoing   GOAL:   Patient will meet greater than or equal to 90% of their needs, met   MONITOR:   PO intake, Labs, Weight trends, Skin, I & O's  ASSESSMENT:   51 year old female with PMHx of COPD, HTN, Bipolar 1 disorder, IBS, cavitary lesion left lung, s/p video assisted thoracoscopy/decortication 12/26/2016 who presents for acute onset vision changes. Patient from [facility]. Patient admitted for seizure and to rule out CVA.    TF (Vital AF 1.2 formula) discontinued via CORTRAK tube. S/p bedside swallow evaluation per Speech Path 2/26. PO intake 100% per flowsheet records. PCT following for goals of care. Labs and medications reviewed. CBG's 115-102-97.  Diet Order:  Diet regular Room service appropriate? Yes; Fluid consistency: Thin  Skin:  Wound (see comment) (Stg II to buttocks, Unstageable to lip, MSAD buttocks)  Last BM:  2/27  Height:   Ht Readings from Last 1 Encounters:  01/19/17 _0  (1.575 m)    Weight:   Wt Readings from Last 1 Encounters:  01/20/17 161 lb 2.5 oz (73.1 kg)    Ideal Body Weight:  48.9 kg  BMI:  Body mass index is 29.48 kg/m.  Estimated Nutritional Needs:   Kcal:  1700-1900  Protein:  90-100 gm  Fluid:  1.7-1.9 L  EDUCATION NEEDS:   No education needs identified at this time  Arthur Holms, RD, LDN Pager #: [phone] After-Hours Pager #: [phone]

## 2017-01-20 NOTE — Clinical Social Work Note (Signed)
Clinical Social Work Assessment  Patient Details  Name: [name] MRN: [id number] Date of Birth: Jun 23, 1966  Date of referral:  01/20/17               Reason for consult:  Facility Placement                Permission sought to share information with:  Facility Art therapist granted to share information::  No (disoriented)  Name::     Neurosurgeon::  SNF  Relationship::  daughter  Contact Information:     Housing/Transportation Living arrangements for the past 2 months:  Single Family Home Source of Information:  Adult Children Patient Interpreter Needed:  None Criminal Activity/Legal Involvement Pertinent to Current Situation/Hospitalization:  No - Comment as needed Significant Relationships:  Adult Children Lives with:  Self Do you feel safe going back to the place where you live?  No Need for family participation in patient care:  Yes (Comment) (decision making)  Care giving concerns:  Pt lives at home alone- dtr works full time and has small child so no one to offer consistent assistance to patient.   Social Worker assessment / plan:  CSW spoke with dtr concerning plan for time of DC.  Dtr not pleased with stay at previous SNF and interested in looking at other SNF options.  Pt dtr is familiar with SNF referral process after recent admission.  Employment status:    Insurance information:  Medicaid In [place] PT Recommendations:  [place] / Referral to community resources:  [place]  Patient/Family's Response to care:  Patient dtr is agreeable to pt return to another SNF.  Patient interest will need to be assessed when she becomes more oriented.  Patient/Family's Understanding of and Emotional Response to Diagnosis, Current Treatment, and Prognosis:  No questions or concerns at this time- hopeful we can find alternative SNF option.  Emotional Assessment Appearance:  Appears stated  age Attitude/Demeanor/Rapport:  Unable to Assess Affect (typically observed):  Unable to Assess Orientation:  Oriented to Self Alcohol / Substance use:  Not Applicable Psych involvement (Current and /or in the community):  No (Comment)  Discharge Needs  Concerns to be addressed:  Care Coordination Readmission within the last 30 days:  Yes Current discharge risk:  Physical Impairment Barriers to Discharge:  Continued Medical Work up   [name], LCSW 01/20/2017, 3:36 PM

## 2017-01-20 NOTE — Progress Notes (Addendum)
Pt arrived from 4E to 5M12. Safety precautions and orders revewed with pt. Pt refused scds at this time. VSS. Pt c/o headache. Advil administered. Per 4E RN, pt refused SCDs, TEDs applied. Will continue to monitor.   Ave Filter, RN

## 2017-01-20 NOTE — Progress Notes (Signed)
Physical Therapy Treatment Patient Details Name: [name] MRN: [id number] DOB: 27-Feb-1966 Today's Date: 01/20/2017    History of Present Illness Pt is a 51 y/o female admitted from [facility] and [facility] secondary to visual changes and seizures. PMH including but not limited to bipolar, COPD, HTN and VATS (2018).     PT Comments    Pt with improved function from last visit and able to begin ambulation again. Pt does not recall events of last admission or being at [facility]. Pt continues to require assist and cueing for all mobility. Will continue to follow to maximize function and strength. Recommend daily mobility with nursing assist.   BP 134/107 HR 83  Follow Up Recommendations  SNF;Supervision/Assistance - 24 hour     Equipment Recommendations       Recommendations for Other Services       Precautions / Restrictions Precautions Precautions: Fall    Mobility  Bed Mobility Overal bed mobility: Needs Assistance Bed Mobility: Supine to Sit     Supine to sit: Supervision     General bed mobility comments: supervision for safety and lines with increased time and encouragement  Transfers Overall transfer level: Needs assistance   Transfers: Sit to/from Stand Sit to Stand: Min guard         General transfer comment: cues for hand placement, safety and encouragement to mobilize  Ambulation/Gait Ambulation/Gait assistance: Min assist Ambulation Distance (Feet): 50 Feet Assistive device: Rolling walker (2 wheeled) Gait Pattern/deviations: Step-through pattern;Decreased stride length;Trunk flexed   Gait velocity interpretation: Below normal speed for age/gender General Gait Details: cues for posture, position in RW, encouragement to increase distance with pt declining any further activity   Stairs            Wheelchair Mobility    Modified Rankin (Stroke Patients Only)       Balance Overall balance assessment: Needs assistance    Sitting balance-Leahy Scale: Good       Standing balance-Leahy Scale: Poor                      Cognition Arousal/Alertness: Awake/alert Behavior During Therapy: Flat affect Overall Cognitive Status: Impaired/Different from baseline Area of Impairment: Attention;Memory;Orientation Orientation Level: Disoriented to;Place;Time;Situation Current Attention Level: Sustained Memory: Decreased short-term memory Following Commands: Follows one step commands consistently Safety/Judgement: Decreased awareness of safety   Problem Solving: Slow processing General Comments: Pt states she does not remember entire last admission or being at [facility]    Exercises General Exercises - Lower Extremity Long Arc Quad: AROM;Both;Seated;10 reps Hip Flexion/Marching: AROM;Both;Seated;10 reps    General Comments        Pertinent Vitals/Pain Pain Location: low back pain  Pain Descriptors / Indicators: Aching Pain Intervention(s): Limited activity within patient's tolerance;Monitored during session;Repositioned    Home Living                      Prior Function            PT Goals (current goals can now be found in the care plan section) Progress towards PT goals: Progressing toward goals    Frequency           PT Plan Current plan remains appropriate    Co-evaluation             End of Session Equipment Utilized During Treatment: Gait belt Activity Tolerance: Patient tolerated treatment well Patient left: in chair;with call bell/phone within reach;with chair alarm  set Nurse Communication: Mobility status PT Visit Diagnosis: Muscle weakness (generalized) (M62.81);Difficulty in walking, not elsewhere classified (R26.2)     Time: [id number] PT Time Calculation (min) (ACUTE ONLY): 23 min  Charges:  $Gait Training: 8-22 mins $Therapeutic Activity: 8-22 mins                    G Codes:       Cayla Wiegand B Brycelynn Stampley 18-Feb-2017, 1:24 PM  Elwyn Reach,  [place]

## 2017-01-20 NOTE — Progress Notes (Signed)
Left message for Havy to call back for hand off report for patient transfer

## 2017-01-21 LAB — CBC
HEMATOCRIT: 28.2 % — AB (ref 36.0–46.0)
Hemoglobin: 9 g/dL — ABNORMAL LOW (ref 12.0–15.0)
MCH: 28.5 pg (ref 26.0–34.0)
MCHC: 31.9 g/dL (ref 30.0–36.0)
MCV: 89.2 fL (ref 78.0–100.0)
Platelets: 396 10*3/uL (ref 150–400)
RBC: 3.16 MIL/uL — ABNORMAL LOW (ref 3.87–5.11)
RDW: 16.1 % — AB (ref 11.5–15.5)
WBC: 10.1 10*3/uL (ref 4.0–10.5)

## 2017-01-21 LAB — BASIC METABOLIC PANEL
Anion gap: 11 (ref 5–15)
BUN: 15 mg/dL (ref 6–20)
CHLORIDE: 108 mmol/L (ref 101–111)
CO2: 20 mmol/L — AB (ref 22–32)
CREATININE: 0.83 mg/dL (ref 0.44–1.00)
Calcium: 8.7 mg/dL — ABNORMAL LOW (ref 8.9–10.3)
GFR calc Af Amer: >60 mL/min (ref >60)
GFR calc non Af Amer: >60 mL/min (ref >60)
Glucose, Bld: 82 mg/dL (ref 65–99)
POTASSIUM: 4.6 mmol/L (ref 3.5–5.1)
Sodium: 139 mmol/L (ref 135–145)

## 2017-01-21 LAB — MAGNESIUM: Magnesium: 1.5 mg/dL — ABNORMAL LOW (ref 1.7–2.4)

## 2017-01-21 LAB — CULTURE, BLOOD (ROUTINE X 2)
CULTURE: NO GROWTH
CULTURE: NO GROWTH

## 2017-01-21 NOTE — Progress Notes (Addendum)
Subjective: Patient tired and refusing to take part in much of exam. She is alert and oriented and able to follow commands. Has no visual complaints. No complaints of HA although at 0500 nursing note stated she had a HA and was given Advil.    Exam: Vitals:   01/21/17 0200 01/21/17 0540  BP: (!) 120/97 133/66  Pulse: 77 76  Resp: 18 18  Temp: 98.2 F (36.8 C) 98.2 F (36.8 C)    HEENT-  Normocephalic, no lesions, without obvious abnormality.  Normal external eye and conjunctiva.  Normal TM's bilaterally.  Normal auditory canals and external ears. Normal external nose, mucus membranes and septum.  Normal pharynx.     Gen: In bed, NAD MS: alert and oriented  CN:2-12 intact Motor: MAEW Sensory: intact   Pertinent Labs/Diagnostics: none  Etta Quill PA-C [facility] [phone]  I have seen the patient, she still has trouble counting fingers in the right field, though she can see finger movement, no difficulty in left field.   Impression: 51 year old female with acute encephalopathy and new onset seizure in the setting of PRES, and C. Difficile. She is improving. Her BP has been under control the last 24 hours.Per nursing note still complaining of HA but resting comfortably. I suspect that this is recurrent and the fact taht she is clinically improving with BP control suggests that this is improving as well.   On MRI, some of the areas affected did look bright on diffusion, and she may have some residual deficit from this. She continues to improve, however. Also, I wonder if this could have been related to the MRI changes seen in January as well.   At this point, from a neuro perspective, treatment would be BP control. She seems well controlled currently.   Recommendations: 1) Continue BP control, goal normotension.  2) Would continue keppra for 1 - 2 months.  3) Would repeat imaging in about a month.  4) would recommend PT, OT eval for safety prior to discharge.   5) I will request outpatient follow up with neurology, no further inpatient recommendations at this time. Please call with fruther questions or concerns.   Roland Rack, MD [facility] [phone]  If 7pm- 7am, please page neurology on call as listed in [place].  01/21/2017, 9:32 AM

## 2017-01-21 NOTE — Progress Notes (Signed)
Occupational Therapy Treatment Patient Details Name: [name] MRN: [id number] DOB: [birth year] Today's Date: 01/21/2017    History of present illness Pt is a 51 y/o female admitted from [facility] and [place] secondary to visual changes and seizures. PMH including but not limited to bipolar, COPD, HTN and VATS (2018).    OT comments  Pt demonstrating progress toward OT goals and improved participation with ADL this session. Able to complete toilet transfer with min assist this session and toileting hygiene with mod assist. She continues to demonstrate decreased short-term memory, slow processing, and decreased safety awareness. She did complain of shortness of breath after ADL session with SpO2 91% in supine and improved to 95% with improvement of symptoms when HOB elevated. RN aware and monitoring. Continue to recommend SNF placement for continued rehabilitation in order to maximize independence with ADL. OT will continue to follow acutely.   Follow Up Recommendations  SNF    Equipment Recommendations  None recommended by OT    Recommendations for Other Services      Precautions / Restrictions Precautions Precautions: Fall Precaution Comments: Watch O2 Restrictions Weight Bearing Restrictions: No       Mobility Bed Mobility Overal bed mobility: Needs Assistance Bed Mobility: Supine to Sit     Supine to sit: Supervision     General bed mobility comments: Supervision for safety.  Transfers Overall transfer level: Needs assistance Equipment used: 1 person hand held assist Transfers: Sit to/from Stand Sit to Stand: Min guard         General transfer comment: Min guard for safety.    Balance Overall balance assessment: Needs assistance Sitting-balance support: Feet supported;Bilateral upper extremity supported Sitting balance-Leahy Scale: Good     Standing balance support: No upper extremity supported;Single extremity supported;During functional  activity Standing balance-Leahy Scale: Fair Standing balance comment: Reliant on min assist during functional mobility. Reaching for wall and grab bar.                   ADL Overall ADL's : Needs assistance/impaired                         Toilet Transfer: Minimal assistance;Ambulation;Regular Glass blower/designer Details (indicate cue type and reason): Min assist for steadying during mobility. Toileting- Clothing Manipulation and Hygiene: Moderate assistance;Sit to/from stand Toileting - Clothing Manipulation Details (indicate cue type and reason): Required multimodal cues for initiation and sequencing.     Functional mobility during ADLs: Minimal assistance General ADL Comments: Pt initially reporting that she did "not want to do anything" but she did agree to participate with ADL tasks with encouragement.      Vision                 Additional Comments: Reports blurring of vision and aching pain in her eyes. Able to read and use vision functionally. Will continue to assess.   Perception     Praxis      Cognition   Behavior During Therapy: Flat affect Overall Cognitive Status: Impaired/Different from baseline Area of Impairment: Attention;Memory;Orientation;Problem solving;Safety/judgement;Awareness Orientation Level: Disoriented to;Time;Situation Current Attention Level: Sustained Memory: Decreased short-term memory  Following Commands: Follows one step commands consistently;Follows multi-step commands inconsistently Safety/Judgement: Decreased awareness of safety Awareness: Emergent Problem Solving: Slow processing General Comments: Pt reports that she does not remember anything prior to this admission.       Exercises     Shoulder Instructions  General Comments      Pertinent Vitals/ Pain       Pain Assessment: Faces Faces Pain Scale: Hurts little more Pain Location: low back pain; eyes Pain Descriptors / Indicators:  Aching Pain Intervention(s): Monitored during session;Limited activity within patient's tolerance;Repositioned  Home Living                                          Prior Functioning/Environment              Frequency  Min 2X/week        Progress Toward Goals  OT Goals(current goals can now be found in the care plan section)  Progress towards OT goals: Progressing toward goals  Acute Rehab OT Goals Patient Stated Goal: to get back in bed OT Goal Formulation: Patient unable to participate in goal setting Time For Goal Achievement: 01/30/17 Potential to Achieve Goals: [place] Discharge plan remains appropriate    Co-evaluation                 End of Session Equipment Utilized During Treatment: Gait belt  OT Visit Diagnosis: Unsteadiness on feet (R26.81)   Activity Tolerance Patient limited by fatigue   Patient Left in bed;with call bell/phone within reach;with bed alarm set   Nurse Communication Mobility status;Other (comment) (SpO2 91% on lying flat & SOB; up to 95% with HOB elevated)        Time: [id number] OT Time Calculation (min): 26 min  Charges: OT General Charges $OT Visit: 1 Procedure OT Treatments $Self Care/Home Management : 23-37 mins  Norman Herrlich, MS OTR/L  Pager: [place] A Duana Benedict 01/21/2017, 5:23 PM

## 2017-01-21 NOTE — Progress Notes (Signed)
[place] TEAM 1 - Stepdown/ICU TEAM  Rifky S Nilson  MRN:[id number] DOB: 11/03/1966 DOA: 01/15/2017 PCP: ARVIND,MOOGALI M, MD    Brief Narrative:  51 y.o. F Hx Bipolar disorder, Depression, Asthma/COPD, Cavitary lesion left lung s/p VATS 12/26/2016, and Chronic low back pain who was brought to ED 2/22 from her SNF for acute visual acuity changes. In the ED the pt had a 60 sec tonic clonic seizure with a postictal state. CT brain noted a subactue non-hemorrhagic left cerebellar infarct, no acute intracranial hemorrhage, and an old left frontal infarct with encephalomalacia. MRI brain 2/23 was c/w PRES but revealed no evidence of an acute CVA.   This is the second admission in the last two months. Patient was admitted previously 12/12/16 with AMS, SOB found to have multilobar pneumonia, resp failure requiring intubation. During that admission she was found to have cavitary lesion with MRSA + empyema and underwent VATS. She was extubated, her mental status and respiratory status improved and she was d/c\'d to [facility] on  2/19   Subjective: The patient has no new complaints.   Assessment & Plan:  Acute encephalopathy/PRES / Seizure likely secondary to PRES  Neurology following - appears to be slowly improving, as would be expected  Needs aggressive treatment of blood pressure. Will monitor for another 24 hours. If BP gets close to 150 or above will add amlodipine Neurology recommends Keppra for 3 months. If she remains seizure-free could discontinue it at that point.  Avoid CNS active meds, particularly benzos, opiates, and anything with strong anticholinergic properties.  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  HTN Blood pressure uncontrolled - adjust  treatment and follow PRN labetalol for SBP > 150  LLL empyema/abscess S/p VATS 12/26/16 - has completed a course of abx Path report negative for underlying malignancy   Prior C diff colitis  Diagnosed during last hospitalization - continues on previously prescribed oral van tx course through 01/22/17  Hypokalemia Corrected  Hypomagnesemia Replace and follow  Acute Renal failure Resolved w/ volume expansion   Recent Labs Lab 01/17/17 0301 01/18/17 0323 01/19/17 0400 01/20/17 0313 01/21/17 0420  CREATININE 1.18* 1.08* 0.82 0.70 0.83    COPD Well compensated at present  Bipolar Disorder Continue usual home medications  Chronic back pain w/ chronic narcotic dependence  Use only low-dose narcotics when absolutely necessary  DVT prophylaxis: SCDs, start Lovenox Code Status: FULL CODE Family Communication: no family present at time of exam  Disposition Plan: tx to tele   Consultants:  Neurology Palliative Care  Procedures: none  Antimicrobials:  Vancomycin 2/22 >   Objective: Blood pressure 132/70, pulse 78, temperature 97.8 F (36.6 C), temperature source Oral, resp. rate 18, height 5\' 2" (1.575 m), weight 73.1 kg (161 lb 2.5 oz), SpO2 100 %.  Intake/Output Summary (Last 24 hours) at 01/21/17 1615 Last data filed at 01/21/17 0600  Gross per 24 hour  Intake              350 ml  Output              50 0 ml  Net             -150 ml   Filed Weights   01/19/17 0500 01/19/17  0846 01/20/17 0432  Weight: 73.6 kg (162 lb 4.1 oz) 73.9 kg (162 lb 14.7 oz) 73.1 kg (161 lb 2.5 oz)    Examination: General: No acute respiratory distress, alert and awake Lungs: Clear to auscultation bilaterally without wheezes or crackles, equal chest rise. Cardiovascular: Regular rate and rhythm without murmur gallop or rub normal S1 and S2 Abdomen: Nontender, nondistended, soft, bowel sounds positive, no rebound, no ascites, no appreciable mass Extremities: No significant  cyanosis, clubbing, or edema bilateral lower extremities  CBC:  Recent Labs Lab 01/15/17 1730  01/17/17 0301 01/18/17 0323 01/18/17 0812 01/19/17 0400 01/20/17 0313 01/21/17 0420  WBC 14.8*  --  12.2* 9.0  --  11.3* 11.5* 10.1  NEUTROABS 11.6*  --   --   --   --   --   --   --   HGB 11.5*  < > 11.1* 10.9*  --  10.1* 10.6* 9.0*  HCT 35.9*  < > 34.0* 34.1* 35.1 31.9* 32.7* 28.2*  MCV 89.8  --  86.7 87.2  --  88.6 88.6 89.2  PLT 504*  --  360 334  --  288 294 396  < > = values in this interval not displayed. Basic Metabolic Panel:  Recent Labs Lab 01/17/17 0301 01/18/17 0323 01/19/17 0400 01/20/17 0313 01/21/17 0420  NA 138 138 139 139 139  K 2.9* 4.1 3.9 3.8 4.6  CL 90* 95* 106 106 108  CO2 30 28 23  21* 20*  GLUCOSE 118* 110* 105* 92 82  BUN 10 15 21* 11 15  CREATININE 1.18* 1.08* 0.82 0.70 0.83  CALCIUM 8.7* 8.8* 8.6* 9.3 8.7*  MG 1.8 2.1 1.4* 1.8 1.5*   GFR: Estimated Creatinine Clearance: 75.1 mL/min (by C-G formula based on SCr of 0.83 mg/dL).  Liver Function Tests:  Recent Labs Lab 01/15/17 1730 01/16/17 0701  AST 30 20  ALT 15 11*  ALKPHOS 128* 113  BILITOT 0.7 0.9  PROT 7.6 6.2*  ALBUMIN 3.3* 2.7*    Coagulation Profile:  Recent Labs Lab 01/15/17 1730  INR 1.09    HbA1C: Hgb A1c MFr Bld  Date/Time Value Ref Range Status  01/16/2017 07:01 AM 5.4 4.8 - 5.6 % Final    Comment:    (NOTE)         Pre-diabetes: 5.7 - 6.4         Diabetes: >6.4         Glycemic control for adults with diabetes: <7.0   12/13/2016 06:13 AM 5.4 4.8 - 5.6 % Final    Comment:    (NOTE)         Pre-diabetes: 5.7 - 6.4         Diabetes: >6.4         Glycemic control for adults with diabetes: <7.0     CBG:  Recent Labs Lab 01/19/17 0833 01/19/17 1142 01/19/17 1638 01/19/17 1935 01/20/17 1628  GLUCAP 102* 115* 102* 97 141*    Recent Results (from the past 240 hour(s))  MRSA PCR Screening     Status: None   Collection Time: 01/16/17  1:13 AM    Result Value Ref Range Status   MRSA by PCR NEGATIVE NEGATIVE Final    Comment:        The GeneXpert MRSA Assay (FDA approved for NASAL specimens only), is one component of a comprehensive MRSA colonization surveillance program. It is not intended to diagnose MRSA infection nor to guide or monitor treatment for MRSA infections.  Culture, Urine     Status: None   Collection Time: 01/16/17  2:32 PM  Result Value Ref Range Status   Specimen Description URINE, CATHETERIZED  Final   Special Requests NONE  Final   Culture NO GROWTH  Final   Report Status 01/17/2017 FINAL  Final  Culture, blood (routine x 2)     Status: None   Collection Time: 01/16/17  3:51 PM  Result Value Ref Range Status   Specimen Description BLOOD LEFT ARM  Final   Special Requests IN PEDIATRIC BOTTLE 3CC  Final   Culture NO GROWTH 5 DAYS  Final   Report Status 01/21/2017 FINAL  Final  Culture, blood (routine x 2)     Status: None   Collection Time: 01/16/17  3:51 PM  Result Value Ref Range Status   Specimen Description BLOOD LEFT ARM  Final   Special Requests IN PEDIATRIC BOTTLE 2CC  Final   Culture NO GROWTH 5 DAYS  Final   Report Status 01/21/2017 FINAL  Final     Scheduled Meds: . budesonide  0.5 mg Nebulization BID  . enoxaparin (LOVENOX) injection  40 mg Subcutaneous Q24H  . hydrALAZINE  100 mg Oral Q6H  . levETIRAcetam  1,000 mg Intravenous Q12H  . metoprolol tartrate  100 mg Oral BID  . QUEtiapine  100 mg Oral QHS  . simvastatin  40 mg Oral q1800  . vancomycin  250 mg Oral Q6H     LOS: 6 days     [facility] Office  [phone] Pager - Text Page per Shea Evans as per below:    If 7PM-7AM, please contact night-coverage [email or web address] Password [facility] 01/21/2017, 4:15 PM

## 2017-01-22 LAB — MAGNESIUM: MAGNESIUM: 1.2 mg/dL — AB (ref 1.7–2.4)

## 2017-01-22 MED ORDER — METOPROLOL TARTRATE 100 MG PO TABS: 100.0000 mg | ORAL_TABLET | Freq: Two times a day (BID) | ORAL | 0 refills | Status: DC

## 2017-01-22 MED ORDER — LEVETIRACETAM 1000 MG PO TABS: 1000.0000 mg | ORAL_TABLET | Freq: Two times a day (BID) | ORAL | 0 refills | Status: DC

## 2017-01-22 MED ORDER — SIMVASTATIN 40 MG PO TABS: 40.0000 mg | ORAL_TABLET | Freq: Every day | ORAL | 0 refills | Status: DC

## 2017-01-22 MED ORDER — HYDRALAZINE HCL 100 MG PO TABS: 100.0000 mg | ORAL_TABLET | Freq: Four times a day (QID) | ORAL | 0 refills | Status: DC

## 2017-01-22 NOTE — Care Management Note (Signed)
Case Management Note  Patient Details  Name: [name] STCHARLES MRN: [id number] Date of Birth: January 20, 1966  Subjective/Objective:                    Action/Plan: Pt and daughter have decided that the best choice is for her to return to [facility] for rehab. CSW arranging. No further needs per CM.  Expected Discharge Date:  01/22/17               Expected Discharge Plan:  Skilled Nursing Facility  In-House Referral:  Clinical Social Work  Discharge planning Services  CM Consult  Post Acute Care Choice:    Choice offered to:     DME Arranged:    DME Agency:     HH Arranged:    [place] Agency:     Status of Service:  Completed, signed off  If discussed at [employer], dates discussed:    Additional Comments:  [name], RN 01/22/2017, 3:59 PM

## 2017-01-22 NOTE — Progress Notes (Signed)
CM called and spoke to [place], patients daughter, over the phone about the possibility of patient d/cing home to the daughters house. [name] is willing to take mother home but is not able to provide 24 hour care. When notified about ability to set up home health she just asked about medical readiness and medical condition of her mother. She asked that MD call. CM notified Dr Wendee Beavers. CM following.

## 2017-01-22 NOTE — Progress Notes (Addendum)
4:15pm  [name] now unwilling to accept- [facility] can still take- new report #: [phone]   3:50pm This am patient was refusing SNF- CSW spoke with dtr [name] who was agreeable to take patient home at first as long as no longer contagious.  After more deliberation [name] is not comfortable taking patient home at this time and hopeful for SNF placement- CSW able to find alternate SNF for patient since pt had not likely previous SNF.  Patient will discharge to [facility] Anticipated discharge date: 3/1 Family notified: dtr [name] by [name]- she will come to hospital at 5:30pm- facility can accept patient after 6pm this evening Report #[phone]   PTAR packet on chart in case dtr unable to pick patient up- after hours PTAR number is [id number] ext 1, 3  CSW signing off.  [name], LCSW Clinical Social Worker [phone]

## 2017-01-22 NOTE — Progress Notes (Signed)
Patient discharged to skilled nursing facility. Daughter to transport to [facility] via her daughter's vehicle. Discharge information given. IV discontinued. Patient left unit via wheelchair with staff. Wendee Copp

## 2017-01-22 NOTE — Progress Notes (Signed)
Physical Therapy Treatment Patient Details Name: [name] MRN: [id number] DOB: 04/11/1966 Today's Date: 01/22/2017    History of Present Illness Pt is a 51 y/o female admitted from [facility] and [place] secondary to visual changes and seizures. PMH including but not limited to bipolar, COPD, HTN and VATS (2018).     PT Comments    Patient with limited participation this session due to c/o nausea.  Still weak and unsteady on her feet, but seems to be resistant to using an assistive device.  Will need SNF level rehab at d/c to progress strength, balance and safety prior to d/c home.    Follow Up Recommendations  SNF;Supervision/Assistance - 24 hour     Equipment Recommendations  None recommended by PT    Recommendations for Other Services       Precautions / Restrictions Precautions Precautions: Fall    Mobility  Bed Mobility   Bed Mobility: Supine to Sit     Supine to sit: Modified independent (Device/Increase time)        Transfers Overall transfer level: Needs assistance   Transfers: Sit to/from Stand Sit to Stand: Min assist         General transfer comment: assist for safety, reports not using a device  Ambulation/Gait Ambulation/Gait assistance: Min assist Ambulation Distance (Feet): 16 Feet (& 12') Assistive device: 1 person hand held assist Gait Pattern/deviations: Step-through pattern;Decreased stride length;Trunk flexed     General Gait Details: flexed with c/o nausea so limited ambulation,  Much encouragement for OOB and only agreeable due to needing to use bathroom   Stairs            Wheelchair Mobility    Modified Rankin (Stroke Patients Only)       Balance Overall balance assessment: Needs assistance   Sitting balance-Leahy Scale: Good       Standing balance-Leahy Scale: Fair Standing balance comment: reaching for support with ambulation, minguard for mobility, pt declined to wash her hands, used hand  sanitizer outside bathroom, S for static standing                    Cognition Arousal/Alertness: Awake/alert Behavior During Therapy: Flat affect Overall Cognitive Status: Impaired/Different from baseline Area of Impairment: Memory Orientation Level: Disoriented to;Time Current Attention Level: Sustained Memory: Decreased short-term memory Following Commands: Follows multi-step commands consistently Safety/Judgement: Decreased awareness of safety     General Comments: Pt reports that she does not remember anything prior to this admission.     Exercises      General Comments        Pertinent Vitals/Pain Faces Pain Scale: Hurts little more Pain Location: stomach Pain Descriptors / Indicators: Aching (nausea) Pain Intervention(s): Monitored during session;Patient requesting pain meds-RN notified    Home Living                      Prior Function            PT Goals (current goals can now be found in the care plan section) Progress towards PT goals: Progressing toward goals    Frequency    Min 3X/week      PT Plan Current plan remains appropriate    Co-evaluation             End of Session   Activity Tolerance: Patient limited by fatigue;Other (comment) (nausea) Patient left: with call bell/phone within reach;in bed Nurse Communication: Mobility status;Other (comment) (request nausea meds)  PT Visit Diagnosis: Muscle weakness (generalized) (M62.81);Difficulty in walking, not elsewhere classified (R26.2)     Time: [id number] PT Time Calculation (min) (ACUTE ONLY): 15 min  Charges:  $Gait Training: 8-22 mins                    G Codes:       Reginia Naas 2017/02/04, 5:03 PM  Magda Kiel, [place] 02-04-2017

## 2017-01-22 NOTE — Discharge Summary (Signed)
Physician Discharge Summary  [name] WF:[id number] DOB: [birth year] DOA: 01/15/2017  PCP: [name], MD  Admit date: 01/15/2017 Discharge date: 01/22/2017  Time spent: > 35 minutes  Recommendations for Outpatient Follow-up:  1. Ensure pt has f/u with neurologist within the next 1 month. They will have to decide whether or not to continue Keppra 2. Ensure great blood pressure control   Discharge Diagnoses:  Principal Problem:   Seizure ([name]) Active Problems:   Essential hypertension   Anemia   Hypokalemia   Renal insufficiency   Hyperglycemia   PRES (posterior reversible encephalopathy syndrome)   Acute encephalopathy   Visual disturbance   Palliative care by specialist   Goals of care, counseling/discussion   Acute renal failure ([name])   Bipolar I disorder ([name])   Chronic pain syndrome   Discharge Condition: stable  Diet recommendation: regular diet  Filed Weights   01/19/17 0500 01/19/17 0846 01/20/17 0432  Weight: 73.6 kg (162 lb 4.1 oz) 73.9 kg (162 lb 14.7 oz) 73.1 kg (161 lb 2.5 oz)    History of present illness:   51 year old female with acute encephalopathy and new onset seizure in the setting of PRES, and C. Difficile.   Hospital Course:  Seizure 2ary to PRES per neurology: 1) Continue BP control, goal normotension.  2) Would continue keppra for 1 - 2 months.  3) Would repeat imaging in about a month.  4) would recommend PT, OT eval for safety prior to discharge.  5) I will request outpatient follow up with neurology, no further inpatient recommendations at this time. Please call with fruther questions or concerns  Essential HTN - d/c on hydralazine and metoprolol, please see dose and frequency below.  Procedures: none  Consultations:  Neurology  Discharge Exam: Vitals:   01/22/17 0550 01/22/17 1026  BP: 140/73 (!) 143/81  Pulse: 75 82  Resp: 20 20  Temp: 98.1 F (36.7 C) 97.8 F (36.6 C)    General: Pt in nad, alert and  awake Cardiovascular: no cyanosis Respiratory: no increased wob, no wheezes  Discharge Instructions   Discharge Instructions    Call MD for:  difficulty breathing, headache or visual disturbances    Complete by:  As directed    Call MD for:  severe uncontrolled pain    Complete by:  As directed    Call MD for:  temperature >100.4    Complete by:  As directed    Diet - low sodium heart healthy    Complete by:  As directed    Increase activity slowly    Complete by:  As directed      Current Discharge Medication List    START taking these medications   Details  levETIRAcetam (KEPPRA) 1000 MG tablet Take 1 tablet (1,000 mg total) by mouth 2 (two) times daily. Qty: 60 tablet, Refills: 0    simvastatin (ZOCOR) 40 MG tablet Take 1 tablet (40 mg total) by mouth daily at 6 PM. Qty: 30 tablet, Refills: 0      CONTINUE these medications which have CHANGED   Details  hydrALAZINE (APRESOLINE) 100 MG tablet Take 1 tablet (100 mg total) by mouth every 6 (six) hours. Qty: 90 tablet, Refills: 0    metoprolol (LOPRESSOR) 100 MG tablet Take 1 tablet (100 mg total) by mouth 2 (two) times daily. Qty: 60 tablet, Refills: 0      CONTINUE these medications which have NOT CHANGED   Details  budesonide (PULMICORT) 0.5 MG/2ML nebulizer solution Take 2  mLs (0.5 mg total) by nebulization 2 (two) times daily. Qty: 250 mL, Refills: 0    HYDROcodone-homatropine (HYCODAN) 5-1.5 MG/5ML syrup Take 5 mLs by mouth every 4 (four) hours as needed for cough. Qty: 120 mL, Refills: 0    ipratropium-albuterol (DUONEB) 0.5-2.5 (3) MG/3ML SOLN Take 3 mLs by nebulization every 6 (six) hours as needed. Qty: 360 mL, Refills: 0    oxyCODONE (OXY IR/ROXICODONE) 5 MG immediate release tablet Take 1 tablet (5 mg total) by mouth every 6 (six) hours as needed for moderate pain. Qty: 30 tablet, Refills: 0    pantoprazole (PROTONIX) 40 MG tablet Take 1 tablet (40 mg total) by mouth daily at 12 noon. Qty: 30 tablet,  Refills: 0    QUEtiapine (SEROQUEL) 100 MG tablet Take 1 tablet (100 mg total) by mouth at bedtime. Qty: 30 tablet, Refills: 0    vancomycin (VANCOCIN) 50 mg/mL oral solution Take 5 mLs (250 mg total) by mouth every 6 (six) hours. Stop date 01/22/2017 after the last dose Qty: 200 mL, Refills: 0      STOP taking these medications     fluconazole (DIFLUCAN) 200 MG tablet      linezolid (ZYVOX) 600 MG tablet        Allergies  Allergen Reactions  . Cymbalta [Duloxetine Hcl] Other (See Comments)    Mood changes...mean  . Mucinex [Guaifenesin Er] Other (See Comments)    Possible allergy, per patient (??)  . Nyquil Multi-Symptom [Pseudoeph-Doxylamine-Dm-Apap] Hives  . Paxil [Paroxetine Hcl] Other (See Comments)    Mean and aggresive  . Promethazine Itching  . Tramadol Itching  . Wellbutrin [Bupropion] Other (See Comments)    Rapid heartrate  . Zoloft [Sertraline] Other (See Comments)    Anger      The results of significant diagnostics from this hospitalization (including imaging, microbiology, ancillary and laboratory) are listed below for reference.    Significant Diagnostic Studies: Dg Chest 2 View  Result Date: 01/08/2017 CLINICAL DATA:  51 year old female status post left VATS drainage and debridement of left lower lobe lung abscess 12/26/2016. Initial encounter. EXAM: CHEST  2 VIEW COMPARISON:  01/07/2017 and earlier FINDINGS: Seated AP and lateral views of the chest. Stable left PICC line. Stable lung volumes, within normal limits. Stable cardiac size and mediastinal contours. No pneumothorax or pulmonary edema. No definite pleural effusion. Residual mixed patchy and cavitary opacity in the left lung is stable. Superimposed mild bibasilar atelectasis. Multiple right lateral rib fractures. No acute osseous abnormality identified. Stable cholecystectomy clips. Negative visible bowel gas pattern. IMPRESSION: 1. Stable residual mixed patchy and cavitary opacity in the left lung.  Mild bibasilar atelectasis. No pneumothorax identified and no definite pleural effusion. 2. No new cardiopulmonary abnormality. Electronically Signed   By: Genevie Ann M.D.   On: 01/08/2017 08:29   Dg Chest 2 View  Result Date: 01/06/2017 CLINICAL DATA:  Status post left-sided decortication on February 2nd, 2018 EXAM: CHEST  2 VIEW COMPARISON:  Portable chest x-ray of January 05, 2017 FINDINGS: The lungs are slightly less well inflated today. There is a persistent small left pleural effusion versus pleural thickening laterally. The left-sided chest tube is in stable position. There is no pneumothorax. The interstitial markings are both lungs are coarse. The heart is top-normal in size. The central pulmonary vascularity is prominent. The left-sided PICC line tip projects over the midportion of the SVC. IMPRESSION: No pneumothorax or significant re-accumulation of left pleural fluid. Stable appearance of the left chest tube and left  PICC line. Stable bilateral pulmonary interstitial prominence. Electronically Signed   By: David  Martinique M.D.   On: 01/06/2017 08:11   Ct Head Wo Contrast  Result Date: 01/15/2017 CLINICAL DATA:  Per EMS, pt from [facility], staff reports pt reported she is not able to see. Did not report this to the Paramedic, however, she would not open her eyes and states that she does not feel good. EXAM: CT HEAD WITHOUT CONTRAST TECHNIQUE: Contiguous axial images were obtained from the base of the skull through the vertex without intravenous contrast. COMPARISON:  12/12/2016 FINDINGS: Brain: Progressive hypoattenuation posteriorly in both occipital lobes, left greater than right. Subacute nonhemorrhagic left cerebellar infarct. No acute intracranial hemorrhage. 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 Patchy areas of hypoattenuation in deep and periventricular white matter bilaterally. No midline shift. No extra-axial mass. No hydrocephalus. Vascular: No hyperdense vessel or  unexpected calcification. Skull: Normal. Negative for fracture or focal lesion. Sinuses/Orbits: No acute finding. Other: None. IMPRESSION: 1. Progressive parenchymal hypoattenuation in left cerebellum and bilateral posterior occipital lobes. 2. No hemorrhage or other acute finding. Electronically Signed   By: Lucrezia Europe M.D.   On: 01/15/2017 18:24   Ct Chest Wo Contrast  Result Date: 12/25/2016 CLINICAL DATA:  LEFT lower lobe abscess.  Bacteria pneumonia. EXAM: CT CHEST WITHOUT CONTRAST TECHNIQUE: Multidetector CT imaging of the chest was performed following the standard protocol without IV contrast. COMPARISON:  Chest CT 12/22/2016 FINDINGS: Cardiovascular: No significant vascular findings. Normal heart size. No pericardial effusion. Mediastinum/Nodes: No axillary supraclavicular adenopathy no mediastinal hilar adenopathy. No pericardial effusion esophagus normal. Lungs/Pleura: Moderate-sized LEFT pneumothorax with gas collecting anteriorly in this supine patient (approximately 20% hemothorax volume). This fluid level at the inferior LEFT hemithorax consistent with a component of hydropneumothorax (image 110, series 4). Cavitary lesion / pulmonary abscess in the lingula is similar in size measuring 6.2 by 3.8 cm compared to 6.3 x 3.8 cm (image 67, series 4) There is new loculated fluid at the RIGHT lung base measuring 6.3 cm in depth with a small air-fluid level. Loculated fluid along the LEFT cardiac border. There is new atelectasis of the LEFT lung base. RIGHT lung is relatively clear.  There is centrilobular emphysema Upper Abdomen: Adrenal glands normal. Musculoskeletal: Multiple bilateral healed rib fractures laterally on the LEFT and RIGHT. IMPRESSION: 1. Moderate volume LEFT hydropneumothorax slightly increased in volume from radiograph 12/24/2016. 2. No change in size of pulmonary abscess in the lingula. 3. Interval increase in loculated fluid at the LEFT lung base with scattered air pockets and lower  lobe atelectasis. These results will be called to the ordering clinician or representative by the Radiologist Assistant, and communication documented in the PACS or zVision Dashboard. Electronically Signed   By: Suzy Bouchard M.D.   On: 12/25/2016 09:06   Mr Brain Wo Contrast  Result Date: 01/16/2017 CLINICAL DATA:  New onset visual changes and seizure activity. Elevated blood pressure (215/127). EXAM: MRI HEAD WITHOUT CONTRAST MRA HEAD WITHOUT CONTRAST TECHNIQUE: Multiplanar, multiecho pulse sequences of the brain and surrounding structures were obtained without intravenous contrast. Angiographic images of the head were obtained using MRA technique without contrast. COMPARISON:  Head CT 01/15/2017 and MRI 12/14/2016 FINDINGS: MRI HEAD FINDINGS Multiple sequences are moderately motion degraded. Brain: A subcentimeter focus of abnormal diffusion signal is again seen in the left cerebellum, however there is new masslike confluent T2 hyperintensity in the left greater than right cerebellar hemispheres without significant posterior fossa mass effect. The there are  also multiple new regions of relatively extensive, patchy and confluent cortical and subcortical T2 hyperintensity in both cerebral hemispheres, greatest in the occipital and posterior temporal lobes bilaterally as well as left parietal and high left frontal lobes. There is milder involvement of the right parietal and right frontal lobes. Diffusion is largely facilitated in these regions, however there are areas of predominantly cortically based restricted diffusion most notable in the occipital lobes. No definite intracranial hemorrhage is identified. There is no midline shift or extra-axial fluid collection. Ventricles are unchanged in size. Encephalomalacia is again noted the anteroinferiorly in the left frontal lobe and may reflect remote trauma or prior ischemic infarction. Vascular: Non-visualization of the distal left vertebral artery, unchanged.  Skull and upper cervical spine: Unremarkable bone marrow signal. Sinuses/Orbits: No gross acute abnormality. Other: None. MRA HEAD FINDINGS The study is moderately motion degraded. The visualized distal right vertebral artery is dominant and widely patent, supplying the basilar. A left vertebral artery is not identified and may be occluded or hypoplastic. The basilar artery is patent without evidence of significant stenosis. Left AICA and bilateral SCAs are visualized. There is a small posterior communicating artery on the left. PCAs are patent without evidence of significant proximal stenosis. The internal carotid arteries are patent from skullbase to carotid termini without evidence of flow limiting stenosis. A 2 x 2 mm outpouching is present at the left posterior communicating origin. ACAs and MCAs are patent. The left A1 segment is dominant. A potentially high-grade stenosis is questioned at the right ACA origin with potential moderate stenosis more distally in the right A1 segment, however this appearance may be at least partly due to motion artifact. There may be a moderate distal left M1 stenosis, and proximal M2 stenoses are questioned bilaterally. IMPRESSION: 1. 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 2. Motion degraded MRA with multiple questioned anterior circulation stenoses versus motion artifact as above. Consider further evaluation with CTA as an outpatient once the patient's acute illness has resolved and he is better able to remain motionless. 3. Hypoplastic versus occluded distal left vertebral artery. Widely patent and dominant right vertebral artery. 4. 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 Electronically Signed   By: Logan Bores M.D.   On:  01/16/2017 13:28   US Renal  Result Date: 01/07/2017 CLINICAL DATA:  Acute renal failure EXAM: RENAL / URINARY TRACT ULTRASOUND COMPLETE COMPARISON:  None. FINDINGS: Right Kidney: Length: 13.5 cm. Echogenicity within normal limits. No mass or hydronephrosis visualized. Left Kidney: Length: 11.2 cm. Echogenicity within normal limits. No mass or hydronephrosis visualized. Assessment of the left kidney was slightly limited due to overlying bandaging. Bladder: Appears normal for degree of bladder distention. IMPRESSION: No obstructive uropathy or renal mass. No nephrolithiasis. Cortical-medullary distinction is maintained within both kidneys. Electronically Signed   By: Ashley Royalty M.D.   On: 01/07/2017 19:40   Dg Chest Port 1 View  Result Date: 01/15/2017 CLINICAL DATA:  visual changes. Seems like she has AMS. Pt started seizing and drooling while EMT was putting patient on bedpain. Pt eyes deviated to right. Pt able to move freely but making clonic movements with her feet. Hx asthma, COPD, HTN, current smoker EXAM: PORTABLE CHEST - 1 VIEW COMPARISON:  01/08/2017 FINDINGS: Cavitary lesion in the left mid lung laterally as before. Left lateral pleural thickening  or loculated effusion. No new infiltrate. Heart size upper limits normal. No pneumothorax. Visualized bones unremarkable. IMPRESSION: Stable findings in the left hemithorax. No acute or superimposed abnormality. Electronically Signed   By: Lucrezia Europe M.D.   On: 01/15/2017 18:19   Dg Chest Port 1 View  Result Date: 01/07/2017 CLINICAL DATA:  Shortness of breath EXAM: PORTABLE CHEST 1 VIEW COMPARISON:  Yesterday FINDINGS: Left-sided chest tube is been removed. No apical pneumothorax. Lateral left pleural thickening is stable. Stable cavitary focus in the lateral left chest. Normal heart size. Negative mediastinal contours. Left upper extremity PICC with tip at the SVC level. IMPRESSION: 1. No interval pneumothorax after chest tube removal. 2. Known  cavity in the left mid lung. Electronically Signed   By: Monte Fantasia M.D.   On: 01/07/2017 07:59   Dg Chest Port 1 View  Result Date: 01/05/2017 CLINICAL DATA:  Chest pain EXAM: PORTABLE CHEST 1 VIEW COMPARISON:  January 04, 2017 FINDINGS: Central catheter tip is in the superior vena cava. There is a chest tube on the left. There is no evident pneumothorax. There is atelectatic change in the left mid lung and lateral right base regions. No new opacity evident. Heart size and pulmonary vascularity are normal. No adenopathy. There is degenerative change in the right shoulder. IMPRESSION: Tube and catheter positions as described without pneumothorax. Areas of atelectatic change bilaterally. No new opacity. Stable cardiac silhouette. Electronically Signed   By: Lowella Grip III M.D.   On: 01/05/2017 07:10   Dg Chest Port 1 View  Result Date: 01/04/2017 CLINICAL DATA:  Empyema EXAM: PORTABLE CHEST 1 VIEW COMPARISON:  01/03/2017 FINDINGS: Support devices including left chest tube are unchanged. No visible pneumothorax. No confluent opacities. Heart is borderline in size. IMPRESSION: No change.  No pneumothorax. Electronically Signed   By: Rolm Baptise M.D.   On: 01/04/2017 08:45   Dg Chest Port 1 View  Result Date: 01/03/2017 CLINICAL DATA:  Continued surveillance pneumothorax. EXAM: PORTABLE CHEST 1 VIEW COMPARISON:  01/02/2017. FINDINGS: LEFT chest tube unchanged. Streaky LEFT lung densities related to fluid and/or subsegmental atelectasis. No definite LEFT apical pneumothorax is seen on today's study. PICC line tip mid SVC from LEFT arm approach. The small RIGHT effusion stable. Stable cardiomediastinal silhouette. IMPRESSION: No definite LEFT apical pneumothorax on today's exam. Support tubes and apparatus unchanged. Electronically Signed   By: Staci Righter M.D.   On: 01/03/2017 07:31   Dg Chest Port 1 View  Result Date: 01/02/2017 CLINICAL DATA:  Empyema, asthma, hypertension, COPD EXAM:  PORTABLE CHEST 1 VIEW COMPARISON:  Portable exam 0545 hours compared 01/01/2017 FINDINGS: LEFT arm PICC line tip projects over SVC. Pair of LEFT thoracostomy tubes present. EKG leads project over chest. Normal heart size, mediastinal contours, and pulmonary vascularity. Persistent small LEFT pleural effusion and basilar atelectasis. Increased atelectasis and blunting of the RIGHT lateral costophrenic angle. Tiny persistent LEFT apical pneumothorax. Upper lungs clear. Healing told lateral lower RIGHT rib fractures. IMPRESSION: Tiny persistent LEFT apical pneumothorax. Persistent mild LEFT pleural effusion and bibasilar atelectasis. Electronically Signed   By: Lavonia Dana M.D.   On: 01/02/2017 08:45   Dg Chest Port 1 View  Result Date: 01/01/2017 CLINICAL DATA:  Empyema, left chest tube EXAM: PORTABLE CHEST 1 VIEW COMPARISON:  12/31/2016 FINDINGS: Support devices remain in stable position. Left chest tube in place. Small left apical pneumothorax, stable. Mid lung atelectasis on the left. Right lung is clear. Heart is borderline in size. IMPRESSION: Small residual left apical  pneumothorax, stable since prior study. Electronically Signed   By: Rolm Baptise M.D.   On: 01/01/2017 09:07   Dg Chest Port 1 View  Result Date: 12/31/2016 CLINICAL DATA:  Chest tube in place EXAM: PORTABLE CHEST 1 VIEW COMPARISON:  12/30/2016 FINDINGS: Cardiac shadow is stable. Two left thoracostomy catheters are again noted. No pneumothorax is seen. Left-sided PICC line is again noted. Mild atelectatic changes related to the more inferior chest tube are seen. No other focal abnormality is noted. IMPRESSION: Mild left midlung atelectatic changes.  No pneumothorax is noted. Electronically Signed   By: Inez Catalina M.D.   On: 12/31/2016 08:33   Dg Chest Port 1 View  Result Date: 12/30/2016 CLINICAL DATA:  Patient status post the VATs for empyema 12/26/2016. Chest tube in place. EXAM: PORTABLE CHEST 1 VIEW COMPARISON:  Single-view of the  chest 12/29/2016 and 12/28/2016. FINDINGS: Endotracheal tube and NG tube have been removed. Support tubes and lines including a left chest tube are otherwise unchanged. There is no pneumothorax. Small amount of pleural fluid along the lateral left chest wall versus pleural thickening are unchanged. Aeration of the left lower lung zone appears mildly improved. Mild right basilar atelectasis is seen. Bilateral rib fractures are noted. IMPRESSION: Improved aeration in the left lower lung zone. Negative for pneumothorax with a chest tube in place. Electronically Signed   By: Inge Rise M.D.   On: 12/30/2016 07:39   Dg Chest Port 1 View  Result Date: 12/29/2016 CLINICAL DATA:  Intubated patient, left-sided chest tubes. Status post thoracoscopy, decortication, and bronchial lavage 3 days ago EXAM: PORTABLE CHEST 1 VIEW COMPARISON:  Portable chest x-ray of December 28, 2016. FINDINGS: The lungs are well-expanded. The interstitial markings have improved. There is no focal infiltrate. There is no pneumothorax nor large pleural effusion. The 2 left-sided chest tubes are in stable position. The heart is top-normal in size. The pulmonary vascularity is not engorged. The endotracheal tube tip lies approximately 1.9 cm above the carina. The esophagogastric tube tip projects below the inferior margin of the image. The right internal jugular venous catheter tip projects over the midportion of the SVC. IMPRESSION: Improved appearance of the pulmonary interstitium consistent with improved aeration and decreased interstitial edema. No pneumothorax or significant pleural effusion. No focal pneumonia. The support tubes are in reasonable position. Electronically Signed   By: David  Martinique M.D.   On: 12/29/2016 07:27   Dg Chest Port 1 View  Result Date: 12/28/2016 CLINICAL DATA:  51 year old female status post left VATS drainage and debridement of left lower lobe lung abscess postoperative day 2. Initial encounter. EXAM:  PORTABLE CHEST 1 VIEW COMPARISON:  12/27/2016 and earlier. FINDINGS: Portable AP semi upright view at at 0611 hours. Endotracheal tube tip between the level the clavicles and carina. Enteric tube courses to the abdomen, tip not included. Stable right IJ central line. Two left chest tubes remain visible, the 3rd May have been removed. No pneumothorax. Residual patchy peripheral and lung base opacity on the left. Superimposed multilevel left rib fractures. Stable right lung ventilation. Stable cardiac size and mediastinal contours. IMPRESSION: 1. Two left chest tubes remain in place.  No pneumothorax. 2. Stable endotracheal, enteric tubes and right IJ central line. 3. Stable residual left lung base opacity. No new cardiopulmonary abnormality. Electronically Signed   By: Genevie Ann M.D.   On: 12/28/2016 07:39   Dg Chest Port 1 View  Result Date: 12/27/2016 CLINICAL DATA:  51 year old female status post VATS and decortication  for left empyema. Initial encounter. EXAM: PORTABLE CHEST 1 VIEW COMPARISON:  12/26/2016 and earlier. FINDINGS: Portable AP semi upright view at 0559 hours. Endotracheal tube tip is at the clavicles. Enteric tube has been placed and courses to the abdomen, tip not included. Stable right IJ central line. Three left chest tubes remain in place. No pneumothorax identified. Mild regression of patchy residual left lung base opacity. Stable cardiac size and mediastinal contours. Resolved pulmonary vascular congestion. The right lung is clear allowing for portable technique. IMPRESSION: 1. Enteric tube placed and courses to the abdomen, tip not included. 2.  Otherwise stable lines and tubes, including 3 left chest tubes. 3. No pneumothorax. Resolved pulmonary vascular congestion. Mildly regressed residual patchy opacity at the left lung base. Electronically Signed   By: Genevie Ann M.D.   On: 12/27/2016 07:38   Dg Chest Port 1 View  Result Date: 12/26/2016 CLINICAL DATA:  Empyema of pleural space EXAM:  PORTABLE CHEST 1 VIEW COMPARISON:  12/25/2016 radiographic and CT exams FINDINGS: Tip of endotracheal tube projects 3.4 cm above carina. RIGHT jugular central venous catheter with tip projecting over SVC. LEFT thoracostomy tubes present. Upper normal size of cardiac silhouette. Mediastinal contours are vascularity normal. Decrease in LEFT pleural effusion since prior CT exam. No LEFT pneumothorax definitely visualized. Scattered mild infiltrates within RIGHT lung unchanged from prior CT. Bones unremarkable. IMPRESSION: Decrease in LEFT pleural effusion and resolution of LEFT pneumothorax post thoracostomy tube placement. Scattered pulmonary infiltrates. Electronically Signed   By: Lavonia Dana M.D.   On: 12/26/2016 17:24   Dg Chest Port 1 View  Result Date: 12/25/2016 CLINICAL DATA:  Hypoxia and shortness of breath. EXAM: PORTABLE CHEST 1 VIEW COMPARISON:  Radiograph yesterday.  CT 12/22/2016 FINDINGS: Cavitary lesion in the left lung is again seen. Unchanged left lung base opacity may be combination of pleural fluid and airspace disease. Patient is rotated. Left pneumothorax on prior exam is not confidently identified. Minimal right lung base atelectasis. IMPRESSION: 1. Unchanged left basilar opacity likely combination of pleural fluid and airspace disease, atelectasis or pneumonia. Cavitary lesion in the periphery of the left lung is again seen. 2. Left pneumothorax on prior exam is not confidently identified. 3. Right basilar atelectasis. Electronically Signed   By: Jeb Levering M.D.   On: 12/25/2016 04:50   Dg Chest Port 1 View  Result Date: 12/24/2016 CLINICAL DATA:  Shortness of Breath, change in behavior EXAM: PORTABLE CHEST 1 VIEW COMPARISON:  12/23/2016 FINDINGS: Cardiomediastinal silhouette is unremarkable. Stable cavitary lesion in left midlung peripheral measures 4.2 cm. Small partially loculated left pleural effusion left basilar atelectasis or infiltrate. There is small left apical  pneumothorax about 4-5%. No pulmonary edema. IMPRESSION: Stable cavitary lesion left midlung peripheral. Small left pleural effusion left basilar atelectasis or infiltrate. Small left apical pneumothorax. Electronically Signed   By: Lahoma Crocker M.D.   On: 12/24/2016 12:35   Dg Chest Port 1 View  Result Date: 12/23/2016 CLINICAL DATA:  Shortness of breath. EXAM: PORTABLE CHEST 1 VIEW COMPARISON:  Radiograph of December 22, 2016. FINDINGS: Stable cardiomediastinal silhouette. No pneumothorax is noted. Mild central pulmonary vascular congestion is again noted. Stable mild loculated left pleural effusion is noted. Old right rib fractures are noted. IMPRESSION: Stable cardiomediastinal silhouette with mild central pulmonary vascular congestion. Stable mild loculated left pleural effusion. Electronically Signed   By: Marijo Conception, M.D.   On: 12/23/2016 14:09   Dg Abd Portable 1v  Result Date: 01/17/2017 CLINICAL DATA:  NG  tube placement. EXAM: PORTABLE ABDOMEN - 1 VIEW COMPARISON:  Single-view of the abdomen 01/16/2017. FINDINGS: Feeding tube is again seen with the tip at the ligament of Treitz. No new tube is identified. IMPRESSION: Feeding tube tip is at the ligament of Treitz, unchanged. No new tube is seen. Electronically Signed   By: Inge Rise M.D.   On: 01/17/2017 11:14   Dg Abd Portable 1v  Result Date: 01/16/2017 CLINICAL DATA:  NG tube placement EXAM: PORTABLE ABDOMEN - 1 VIEW COMPARISON:  12/28/2016 FINDINGS: Feeding tube course of the stomach into the duodenum with the tip near the ligament of Treitz. Normal bowel gas pattern. IMPRESSION: Feeding tube in the duodenum near the ligament of Treitz. Electronically Signed   By: Franchot Gallo M.D.   On: 01/16/2017 18:53   Dg Abd Portable 1v  Result Date: 12/28/2016 CLINICAL DATA:  Abdominal distension EXAM: PORTABLE ABDOMEN - 1 VIEW COMPARISON:  12/26/2016 abdominal radiograph FINDINGS: Enteric tube terminates in the distal stomach near the  pylorus. Cholecystectomy clips are seen in the right upper quadrant of the abdomen. No disproportionately dilated small bowel loops. Mild gas in stool throughout the large bowel. No evidence of pneumatosis or pneumoperitoneum. Stable blunting of the costophrenic angles at the lung bases bilaterally. No pathologic soft tissue calcifications. IMPRESSION: 1. Enteric tube terminates in the distal stomach in the region of the pylorus . 2. Nonobstructive bowel gas pattern. Electronically Signed   By: Ilona Sorrel M.D.   On: 12/28/2016 13:15   Dg Abd Portable 1v  Result Date: 12/26/2016 CLINICAL DATA:  Nasogastric tube placement.  Initial encounter. EXAM: PORTABLE ABDOMEN - 1 VIEW COMPARISON:  Abdominal radiograph performed 12/24/2016 FINDINGS: The patient's enteric tube is seen ending overlying the antrum of the stomach. There has been interval placement of left-sided chest tubes. Clips are noted within the right upper quadrant, reflecting prior cholecystectomy. The visualized bowel gas pattern is grossly unremarkable. No free intra-abdominal air is seen, though evaluation for free air is limited on a single supine view. No acute osseous abnormalities are identified. IMPRESSION: Enteric tube noted ending overlying the antrum of the stomach. Electronically Signed   By: Garald Balding M.D.   On: 12/26/2016 23:55   Dg Abd Portable 1v  Result Date: 12/24/2016 CLINICAL DATA:  Abdominal pain, labored breathing EXAM: PORTABLE ABDOMEN - 1 VIEW COMPARISON:  12/23/2016 FINDINGS: Decreasing gaseous distention of large and small bowel. Gas within nondistended large small bowel currently. Oral contrast material noted within the right colon and scattered small bowel loops. IMPRESSION: Decreasing gaseous distention of large and small bowel. Electronically Signed   By: Rolm Baptise M.D.   On: 12/24/2016 11:20   Dg Swallowing Func-speech Pathology  Result Date: 01/02/2017 Objective Swallowing Evaluation: Type of Study:  MBS-Modified Barium Swallow Study Patient Details Name: Susannah AVANNA RUESCH MRN: [id number] Date of Birth: May 03, 1966 Today's Date: 01/02/2017 Time: SLP Start Time (ACUTE ONLY): 1415-SLP Stop Time (ACUTE ONLY): 1445 SLP Time Calculation (min) (ACUTE ONLY): 30 min Past Medical History: Past Medical History: Diagnosis Date . Allergic rhinitis  . Arthritis  . Asthma  . Back pain  . Bipolar 1 disorder ([place])  . Chronic low back pain 03/06/2014 . COPD (chronic obstructive pulmonary disease) ([place])  . Depression  . Fibroid  . Hypertension  . IBS (irritable bowel syndrome)  . Migraine  . Scoliosis  Past Surgical History: Past Surgical History: Procedure Laterality Date . CHOLECYSTECTOMY   . DILATION AND CURETTAGE OF UTERUS   . EXPLORATORY LAPAROTOMY  WHEN PATIENT WAS YOUNG . RIGHT FOOT SURGERY   . TUBAL LIGATION Bilateral  . VIDEO ASSISTED THORACOSCOPY (VATS)/DECORTICATION Left 12/26/2016  Procedure: LEFT VIDEO ASSISTED THORACOSCOPY (VATS)/DECORTICATION;  Surgeon: Ivin Poot, MD;  Location: [facility];  Service: Thoracic;  Laterality: Left; Marland Kitchen VIDEO BRONCHOSCOPY N/A 12/26/2016  Procedure: VIDEO BRONCHOSCOPY WITH BRONCHIAL LAVAGE;  Surgeon: Ivin Poot, MD;  Location: [facility] OR;  Service: Thoracic;  Laterality: N/A; HPI: 51 y/o F, 45 pk year smoker, with Hx of COPD/asthma and bipolar disorder admitted 1/19 with SOB. Working diagnosis of acute asthma exacerbation, multilobar PNA with resultant hypoxic respiratory failure. Intubated 12/12/16 to 12/20/16. Left VATS procedure on 12/26/16. Reintubated 2/2-2/5.  Initial MBS 12/22/16  Subjective: alert for evaluation, requires cues to maintain alertness Assessment / Plan / Recommendation CHL IP CLINICAL IMPRESSIONS 01/02/2017 Therapy Diagnosis Mild oral phase dysphagia;Mild pharyngeal phase dysphagia Clinical Impression Pt demonstrates improved overall swallow function and airway protection.  She continues to demonstrate delayed onset of swallow to the pyriforms with thin liquids, and when  consuming large, consecutive boluses, trace aspiration of thins was observed just below vocal folds.  No spontaneous cough response was elicited. However, when limiting boluses to single sips, there was only occasional high penetration, but no aspiration.  Pharyngeal clearance of all materials was excellent; oral phase WNL.  Given pt's improved mental status, recommend advancing diet to regular, thin liquids; meds whole in puree. She should follow basic precautions to ensure safety.  SLP will follow briefly to facilitate safety/education.  Impact on safety and function Mild aspiration risk   CHL IP TREATMENT RECOMMENDATION 12/22/2016 Treatment Recommendations Therapy as outlined in treatment plan below   Prognosis 12/22/2016 Prognosis for Safe Diet Advancement Good Barriers to Reach Goals -- Barriers/Prognosis Comment -- CHL IP DIET RECOMMENDATION 01/02/2017 SLP Diet Recommendations Regular solids;Thin liquid Liquid Administration via Cup;No straw Medication Administration Whole meds with puree Compensations Slow rate;Small sips/bites Postural Changes --   CHL IP OTHER RECOMMENDATIONS 01/02/2017 Recommended Consults -- Oral Care Recommendations Oral care BID Other Recommendations --   CHL IP FOLLOW UP RECOMMENDATIONS 12/30/2016 Follow up Recommendations Other (comment)   CHL IP FREQUENCY AND DURATION 12/22/2016 Speech Therapy Frequency (ACUTE ONLY) min 2x/week Treatment Duration 2 weeks      CHL IP ORAL PHASE 01/02/2017 Oral Phase WFL Oral - Pudding Teaspoon -- Oral - Pudding Cup -- Oral - Honey Teaspoon -- Oral - Honey Cup -- Oral - Nectar Teaspoon -- Oral - Nectar Cup -- Oral - Nectar Straw -- Oral - Thin Teaspoon -- Oral - Thin Cup -- Oral - Thin Straw -- Oral - Puree -- Oral - Mech Soft -- Oral - Regular -- Oral - Multi-Consistency -- Oral - Pill -- Oral Phase - Comment --  CHL IP PHARYNGEAL PHASE 01/02/2017 Pharyngeal Phase Impaired Pharyngeal- Pudding Teaspoon -- Pharyngeal -- Pharyngeal- Pudding Cup -- Pharyngeal --  Pharyngeal- Honey Teaspoon -- Pharyngeal -- Pharyngeal- Honey Cup NT Pharyngeal -- Pharyngeal- Nectar Teaspoon NT Pharyngeal -- Pharyngeal- Nectar Cup Delayed swallow initiation-pyriform sinuses Pharyngeal Material does not enter airway Pharyngeal- Nectar Straw -- Pharyngeal -- Pharyngeal- Thin Teaspoon -- Pharyngeal -- Pharyngeal- Thin Cup Delayed swallow initiation-pyriform sinuses;Trace aspiration Pharyngeal -- Pharyngeal- Thin Straw -- Pharyngeal -- Pharyngeal- Puree Delayed swallow initiation-vallecula Pharyngeal -- Pharyngeal- Mechanical Soft -- Pharyngeal -- Pharyngeal- Regular -- Pharyngeal -- Pharyngeal- Multi-consistency -- Pharyngeal -- Pharyngeal- Pill -- Pharyngeal -- Pharyngeal Comment --  CHL IP CERVICAL ESOPHAGEAL PHASE 01/02/2017 Cervical Esophageal Phase WFL Pudding Teaspoon -- Pudding Cup --  Honey Teaspoon -- Honey Cup -- Nectar Teaspoon -- Nectar Cup -- Nectar Straw -- Thin Teaspoon -- Thin Cup -- Thin Straw -- Puree -- Mechanical Soft -- Regular -- Multi-consistency -- Pill -- Cervical Esophageal Comment -- No flowsheet data found. Juan Quam Laurice 01/02/2017, 3:17 PM              Mr Jodene Nam Head/brain Wo Cm  Result Date: 01/16/2017 CLINICAL DATA:  New onset visual changes and seizure activity. Elevated blood pressure (215/127). EXAM: MRI HEAD WITHOUT CONTRAST MRA HEAD WITHOUT CONTRAST TECHNIQUE: Multiplanar, multiecho pulse sequences of the brain and surrounding structures were obtained without intravenous contrast. Angiographic images of the head were obtained using MRA technique without contrast. COMPARISON:  Head CT 01/15/2017 and MRI 12/14/2016 FINDINGS: MRI HEAD FINDINGS Multiple sequences are moderately motion degraded. Brain: A subcentimeter focus of abnormal diffusion signal is again seen in the left cerebellum, however there is new masslike confluent T2 hyperintensity in the left greater than right cerebellar hemispheres without significant posterior fossa mass effect. The there are  also multiple new regions of relatively extensive, patchy and confluent cortical and subcortical T2 hyperintensity in both cerebral hemispheres, greatest in the occipital and posterior temporal lobes bilaterally as well as left parietal and high left frontal lobes. There is milder involvement of the right parietal and right frontal lobes. Diffusion is largely facilitated in these regions, however there are areas of predominantly cortically based restricted diffusion most notable in the occipital lobes. No definite intracranial hemorrhage is identified. There is no midline shift or extra-axial fluid collection. Ventricles are unchanged in size. Encephalomalacia is again noted the anteroinferiorly in the left frontal lobe and may reflect remote trauma or prior ischemic infarction. Vascular: Non-visualization of the distal left vertebral artery, unchanged. Skull and upper cervical spine: Unremarkable bone marrow signal. Sinuses/Orbits: No gross acute abnormality. Other: None. MRA HEAD FINDINGS The study is moderately motion degraded. The visualized distal right vertebral artery is dominant and widely patent, supplying the basilar. A left vertebral artery is not identified and may be occluded or hypoplastic. The basilar artery is patent without evidence of significant stenosis. Left AICA and bilateral SCAs are visualized. There is a small posterior communicating artery on the left. PCAs are patent without evidence of significant proximal stenosis. The internal carotid arteries are patent from skullbase to carotid termini without evidence of flow limiting stenosis. A 2 x 2 mm outpouching is present at the left posterior communicating origin. ACAs and MCAs are patent. The left A1 segment is dominant. A potentially high-grade stenosis is questioned at the right ACA origin with potential moderate stenosis more distally in the right A1 segment, however this appearance may be at least partly due to motion artifact. There may  be a moderate distal left M1 stenosis, and proximal M2 stenoses are questioned bilaterally. IMPRESSION: 1. 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 2. Motion degraded MRA with multiple questioned anterior circulation stenoses versus motion artifact as above. Consider further evaluation with CTA as an outpatient once the patient's acute illness has resolved and he is better able to remain motionless. 3. Hypoplastic versus occluded distal left vertebral artery. Widely patent and dominant right vertebral artery. 4. 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 Electronically Signed   By: Logan Bores M.D.   On: 01/16/2017 13:28  Microbiology: Recent Results (from the past 240 hour(s))  MRSA PCR Screening     Status: None   Collection Time: 01/16/17  1:13 AM  Result Value Ref Range Status   MRSA by PCR NEGATIVE NEGATIVE Final    Comment:        The GeneXpert MRSA Assay (FDA approved for NASAL specimens only), is one component of a comprehensive MRSA colonization surveillance program. It is not intended to diagnose MRSA infection nor to guide or monitor treatment for MRSA infections.   Culture, Urine     Status: None   Collection Time: 01/16/17  2:32 PM  Result Value Ref Range Status   Specimen Description URINE, CATHETERIZED  Final   Special Requests NONE  Final   Culture NO GROWTH  Final   Report Status 01/17/2017 FINAL  Final  Culture, blood (routine x 2)     Status: None   Collection Time: 01/16/17  3:51 PM  Result Value Ref Range Status   Specimen Description BLOOD LEFT ARM  Final   Special Requests IN PEDIATRIC BOTTLE 3CC  Final   Culture NO GROWTH 5 DAYS  Final   Report Status 01/21/2017 FINAL  Final  Culture, blood (routine x 2)      Status: None   Collection Time: 01/16/17  3:51 PM  Result Value Ref Range Status   Specimen Description BLOOD LEFT ARM  Final   Special Requests IN PEDIATRIC BOTTLE 2CC  Final   Culture NO GROWTH 5 DAYS  Final   Report Status 01/21/2017 FINAL  Final     Labs: Basic Metabolic Panel:  Recent Labs Lab 01/17/17 0301 01/18/17 0323 01/19/17 0400 01/20/17 0313 01/21/17 0420 01/22/17 0416  NA 138 138 139 139 139  --   K 2.9* 4.1 3.9 3.8 4.6  --   CL 90* 95* 106 106 108  --   CO2 30 28 23  21* 20*  --   GLUCOSE 118* 110* 105* 92 82  --   BUN 10 15 21* 11 15  --   CREATININE 1.18* 1.08* 0.82 0.70 0.83  --   CALCIUM 8.7* 8.8* 8.6* 9.3 8.7*  --   MG 1.8 2.1 1.4* 1.8 1.5* 1.2*   Liver Function Tests:  Recent Labs Lab 01/15/17 1730 01/16/17 0701  AST 30 20  ALT 15 11*  ALKPHOS 128* 113  BILITOT 0.7 0.9  PROT 7.6 6.2*  ALBUMIN 3.3* 2.7*   No results for input(s): LIPASE, AMYLASE in the last 168 hours. No results for input(s): AMMONIA in the last 168 hours. CBC:  Recent Labs Lab 01/15/17 1730  01/17/17 0301 01/18/17 0323 01/18/17 0812 01/19/17 0400 01/20/17 0313 01/21/17 0420  WBC 14.8*  --  12.2* 9.0  --  11.3* 11.5* 10.1  NEUTROABS 11.6*  --   --   --   --   --   --   --   HGB 11.5*  < > 11.1* 10.9*  --  10.1* 10.6* 9.0*  HCT 35.9*  < > 34.0* 34.1* 35.1 31.9* 32.7* 28.2*  MCV 89.8  --  86.7 87.2  --  88.6 88.6 89.2  PLT 504*  --  360 334  --  288 294 396  < > = values in this interval not displayed. Cardiac Enzymes: No results for input(s): CKTOTAL, CKMB, CKMBINDEX, TROPONINI in the last 168 hours. BNP: BNP (last 3 results) No results for input(s): BNP in the last 8760 hours.  ProBNP (last 3 results) No results for input(s):  PROBNP in the last 8760 hours.  CBG:  Recent Labs Lab 01/19/17 0833 01/19/17 1142 01/19/17 1638 01/19/17 1935 01/20/17 1628  GLUCAP 102* 115* 102* 97 141*    Signed:  Velvet Bathe MD.  [facility] 01/22/2017, 11:26  AM

## 2017-01-22 NOTE — Progress Notes (Signed)
Patient refuses bed alarm and to have ambulation be assessed by nurse. Falls education provided and risk for injury. Patient verbalizes understanding and continues to refuse bed alarm. Wendee Copp

## 2017-01-22 NOTE — Clinical Social Work Placement (Addendum)
   CLINICAL SOCIAL WORK PLACEMENT  NOTE  Date:  01/22/2017  Patient Details  Name: [name] MRN: [id number] Date of Birth: 10-05-1966  Clinical Social Work is seeking post-discharge placement for this patient at the [place] level of care (*CSW will initial, date and re-position this form in  chart as items are completed):  Yes   Patient/family provided with [facility] Work Department's list of facilities offering this level of care within the geographic area requested by the patient (or if unable, by the patient's family).  Yes   Patient/family informed of their freedom to choose among providers that offer the needed level of care, that participate in Medicare, Medicaid or managed care program needed by the patient, have an available bed and are willing to accept the patient.  Yes   Patient/family informed of [facility]'s ownership interest in [facility] and [facility], as well as of the fact that they are under no obligation to receive care at these facilities.  PASRR submitted to EDS on       PASRR number received on       Existing PASRR number confirmed on 01/22/17     FL2 transmitted to all facilities in geographic area requested by pt/family on 01/22/17     FL2 transmitted to all facilities within larger geographic area on       Patient informed that his/her managed care company has contracts with or will negotiate with certain facilities, including the following:        Yes   Patient/family informed of bed offers received.  Patient chooses bed at [facility]    Physician recommends and patient chooses bed at      Patient to be transferred to [facility] on 01/22/17.  Patient to be transferred to facility by family     Patient family notified on 01/22/17 of transfer.  Name of family member notified:  [name]     PHYSICIAN Please sign FL2     Additional Comment:     _______________________________________________ [name], LCSW 01/22/2017, 3:54 PM

## 2017-01-23 ENCOUNTER — Emergency Department (HOSPITAL_COMMUNITY): Payer: Medicaid Other

## 2017-01-23 ENCOUNTER — Other Ambulatory Visit: Payer: Self-pay | Admitting: Cardiothoracic Surgery

## 2017-01-23 DIAGNOSIS — J449 Chronic obstructive pulmonary disease, unspecified: Secondary | ICD-10-CM | POA: Diagnosis not present

## 2017-01-23 DIAGNOSIS — F17210 Nicotine dependence, cigarettes, uncomplicated: Secondary | ICD-10-CM | POA: Diagnosis not present

## 2017-01-23 DIAGNOSIS — I10 Essential (primary) hypertension: Secondary | ICD-10-CM | POA: Insufficient documentation

## 2017-01-23 DIAGNOSIS — Z79899 Other long term (current) drug therapy: Secondary | ICD-10-CM | POA: Diagnosis not present

## 2017-01-23 DIAGNOSIS — R079 Chest pain, unspecified: Secondary | ICD-10-CM | POA: Insufficient documentation

## 2017-01-23 DIAGNOSIS — J939 Pneumothorax, unspecified: Secondary | ICD-10-CM

## 2017-01-23 DIAGNOSIS — H5712 Ocular pain, left eye: Secondary | ICD-10-CM | POA: Diagnosis not present

## 2017-01-23 DIAGNOSIS — R51 Headache: Secondary | ICD-10-CM | POA: Diagnosis not present

## 2017-01-23 LAB — BASIC METABOLIC PANEL
Anion gap: 13 (ref 5–15)
BUN: 10 mg/dL (ref 6–20)
CHLORIDE: 100 mmol/L — AB (ref 101–111)
CO2: 26 mmol/L (ref 22–32)
Calcium: 9.7 mg/dL (ref 8.9–10.3)
Creatinine, Ser: 0.81 mg/dL (ref 0.44–1.00)
Glucose, Bld: 106 mg/dL — ABNORMAL HIGH (ref 65–99)
POTASSIUM: 4 mmol/L (ref 3.5–5.1)
SODIUM: 139 mmol/L (ref 135–145)

## 2017-01-23 LAB — I-STAT TROPONIN, ED: Troponin i, poc: 0 ng/mL (ref 0.00–0.08)

## 2017-01-23 LAB — CBC
HCT: 31.1 % — ABNORMAL LOW (ref 36.0–46.0)
Hemoglobin: 9.8 g/dL — ABNORMAL LOW (ref 12.0–15.0)
MCH: 28.2 pg (ref 26.0–34.0)
MCHC: 31.5 g/dL (ref 30.0–36.0)
MCV: 89.6 fL (ref 78.0–100.0)
PLATELETS: 588 10*3/uL — AB (ref 150–400)
RBC: 3.47 MIL/uL — AB (ref 3.87–5.11)
RDW: 16.2 % — AB (ref 11.5–15.5)
WBC: 8.2 10*3/uL (ref 4.0–10.5)

## 2017-01-23 NOTE — ED Triage Notes (Signed)
Per EMS: Pt complaining of chest pain, headache and L eye pain. EMS gave 4 zofran and 324 ASA. Pt states ongoing since yesterday.

## 2017-01-24 ENCOUNTER — Emergency Department (HOSPITAL_COMMUNITY)
Admission: EM | Admit: 2017-01-24 | Discharge: 2017-01-24 | Disposition: A | Discharge status: Home / Self Care | Payer: Medicaid Other | Attending: Emergency Medicine | Admitting: Emergency Medicine

## 2017-01-24 ENCOUNTER — Emergency Department (HOSPITAL_COMMUNITY): Payer: Medicaid Other

## 2017-01-24 DIAGNOSIS — R51 Headache: Secondary | ICD-10-CM

## 2017-01-24 DIAGNOSIS — H5712 Ocular pain, left eye: Secondary | ICD-10-CM

## 2017-01-24 DIAGNOSIS — R519 Headache, unspecified: Secondary | ICD-10-CM

## 2017-01-24 LAB — SEDIMENTATION RATE: Sed Rate: 47 mm/hr — ABNORMAL HIGH (ref 0–22)

## 2017-01-24 MED ORDER — LEVETIRACETAM 500 MG PO TABS: 1000.0000 mg | ORAL_TABLET | Freq: Two times a day (BID) | ORAL | Status: DC

## 2017-01-24 MED ORDER — METOPROLOL TARTRATE 25 MG PO TABS: 100.0000 mg | ORAL_TABLET | Freq: Two times a day (BID) | ORAL | Status: DC

## 2017-01-24 MED ORDER — HYDRALAZINE HCL 100 MG PO TABS: 100.0000 mg | ORAL_TABLET | Freq: Four times a day (QID) | ORAL | Status: DC

## 2017-01-24 MED ORDER — SIMVASTATIN 40 MG PO TABS
40.0000 mg | ORAL_TABLET | Freq: Every day | ORAL | Status: DC
Start: 2017-01-24 — End: 2017-01-24

## 2017-01-24 MED ORDER — QUETIAPINE FUMARATE 100 MG PO TABS: 100.0000 mg | ORAL_TABLET | Freq: Every day | ORAL | Status: DC

## 2017-01-24 MED ORDER — IOPAMIDOL (ISOVUE-370) INJECTION 76%
INTRAVENOUS | Status: AC
  Administered 2017-01-24: 50 mL
  Filled 2017-01-24: qty 50

## 2017-01-24 MED ORDER — BUDESONIDE 0.5 MG/2ML IN SUSP: 0.5000 mg | Freq: Two times a day (BID) | RESPIRATORY_TRACT | Status: DC

## 2017-01-24 MED ORDER — IPRATROPIUM-ALBUTEROL 0.5-2.5 (3) MG/3ML IN SOLN: 3.0000 mL | Freq: Four times a day (QID) | RESPIRATORY_TRACT | Status: DC | PRN

## 2017-01-24 MED ORDER — PANTOPRAZOLE SODIUM 40 MG PO TBEC: 40.0000 mg | DELAYED_RELEASE_TABLET | Freq: Every day | ORAL | Status: DC

## 2017-01-24 MED ORDER — OXYCODONE HCL 5 MG PO TABS
15.0000 mg | ORAL_TABLET | Freq: Once | ORAL | Status: AC
  Administered 2017-01-24: 15 mg via ORAL
  Filled 2017-01-24: qty 3

## 2017-01-24 NOTE — ED Notes (Signed)
Patient transported to CT 

## 2017-01-24 NOTE — ED Notes (Signed)
At this time unable to contact daughter. Will continue attempt to call daughter. Charge RN made aware of situation.  Pt remains adamant that she is NOT going back to [facility] respectfully.

## 2017-01-24 NOTE — ED Notes (Signed)
[place] spoke with shelia from admissions at [facility] and pt can now return. Ptar called. Pt updated.

## 2017-01-24 NOTE — Discharge Instructions (Signed)

## 2017-01-24 NOTE — ED Notes (Signed)
Daughter was contacted and made aware of situation. She also does not wont her mom to be transported to [facility].

## 2017-01-24 NOTE — ED Notes (Signed)
MD Laneta Simmers spoke with pt and at this time pt is agreeable to go back to to Nursing facility.

## 2017-01-24 NOTE — ED Notes (Signed)
Pt ambulated to restroom, no issues. 

## 2017-01-24 NOTE — ED Notes (Signed)
Pt is resting in hallway at this time. Social work has been contacted. Other wise waiting for daughter to arrive.

## 2017-01-24 NOTE — ED Notes (Signed)
Pt refusing to go back to [facility] at this time will call daughter.

## 2017-01-24 NOTE — ED Provider Notes (Signed)
[facility] DEPT Provider Note   CSN: [id number] Arrival date & time: 01/23/17  2042     History   Chief Complaint Chief Complaint  Patient presents with  . Chest Pain  . Headache    HPI [name] is a 51 y.o. female who presents for cc of Chest pain and left thigh pain. The patient was discharged yesterday after PRES with new onset seizure. She had a chest tube placed on the left side. She complains of pain at the site of chest tube removal. The patient also complains of stabbing pain in the left eye. She complains of some visual disturbance, discharge. Patient states that she is and is unsure about what happened at her visit. She states "I'm not sure if I was in a coma or not.". Apparently patient was encephalopathic. During the visit. Patient states that she normally takes 15mg  oxy ir at home and that her chronic pain has been poorly controlled on her current dose.  Denies phonophobia, UL throbbing, N/V, stiff neck, neck pain, rash, or "thunderclap" onset.    HPI  Past Medical History:  Diagnosis Date  . Allergic rhinitis   . Arthritis   . Asthma   . Back pain   . Bipolar 1 disorder ([place])   . C. difficile diarrhea   . Cavitary lesion of lung   . Chronic low back pain 03/06/2014  . COPD (chronic obstructive pulmonary disease) ([place])   . Depression   . Fibroid   . Hypertension   . Hypomagnesemia   . IBS (irritable bowel syndrome)   . Migraine   . Scoliosis     Patient Active Problem List   Diagnosis Date Noted  . Palliative care by specialist   . Goals of care, counseling/discussion   . Acute renal failure ([place])   . Bipolar I disorder ([place])   . Chronic pain syndrome   . PRES (posterior reversible encephalopathy syndrome)   . Acute encephalopathy   . Visual disturbance   . Seizure ([place]) 01/15/2017  . Hypokalemia 01/15/2017  . Renal insufficiency 01/15/2017  . Hyperglycemia 01/15/2017  . H/O chest tube placement   . Candida infection   .  Coagulase-negative staphylococcal infection   . Asthma 01/03/2017  . Seasonal allergies 01/03/2017  . Anemia 01/03/2017  . Herpes labialis 01/03/2017  . Empyema of pleural space ([place]) 12/26/2016  . Abscess of lower lobe of left lung with pneumonia ([place])   . Ileus ([place])   . Pneumothorax   . Pneumonia of left upper lobe due to Streptococcus pneumoniae ([place])   . Clostridium difficile colitis   . Acute kidney injury ([place])   . HCAP (healthcare-associated pneumonia)   . Transaminitis   . Pressure injury of skin 12/19/2016  . 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 Acute respiratory failure ([place]) 12/13/2015  . IBS (irritable bowel syndrome) 12/13/2015  . COPD with acute exacerbation ([place])   . Essential hypertension   . Encephalopathy acute 12/03/2015  . Chronic narcotic dependence ([place]) 11/22/2015  . Hypercalcemia 11/22/2015  . Chronic hyponatremia 03/07/2015  . Cigarette nicotine dependence without complication [id number]  . Fibromyalgia syndrome 01/09/2015  . Mixed hyperlipidemia 01/09/2015  . Chronic low back pain 03/06/2014    Past Surgical History:  Procedure Laterality Date  . CHOLECYSTECTOMY    . DILATION AND CURETTAGE OF UTERUS    . EXPLORATORY LAPAROTOMY     WHEN PATIENT WAS YOUNG  . RIGHT FOOT SURGERY    .  TUBAL LIGATION Bilateral   . VIDEO ASSISTED THORACOSCOPY (VATS)/DECORTICATION Left 12/26/2016   Procedure: LEFT VIDEO ASSISTED THORACOSCOPY (VATS)/DECORTICATION;  Surgeon: Ivin Poot, MD;  Location: [facility];  Service: Thoracic;  Laterality: Left;  Marland Kitchen VIDEO BRONCHOSCOPY N/A 12/26/2016   Procedure: VIDEO BRONCHOSCOPY WITH BRONCHIAL LAVAGE;  Surgeon: Ivin Poot, MD;  [facility];  Service: Thoracic;  Laterality: N/A;    OB History    No data available       Home Medications    Prior to Admission medications   Medication Sig Start Date End Date Taking? Authorizing Provider  budesonide (PULMICORT) 0.5 MG/2ML  nebulizer solution Take 2 mLs (0.5 mg total) by nebulization 2 (two) times daily. 01/12/17   Theodis Blaze, MD  hydrALAZINE (APRESOLINE) 100 MG tablet Take 1 tablet (100 mg total) by mouth every 6 (six) hours. 01/22/17   Velvet Bathe, MD  HYDROcodone-homatropine [facility]) 5-1.5 MG/5ML syrup Take 5 mLs by mouth every 4 (four) hours as needed for cough. 01/12/17   Theodis Blaze, MD  ipratropium-albuterol (DUONEB) 0.5-2.5 (3) MG/3ML SOLN Take 3 mLs by nebulization every 6 (six) hours as needed. 01/12/17   Theodis Blaze, MD  levETIRAcetam (KEPPRA) 1000 MG tablet Take 1 tablet (1,000 mg total) by mouth 2 (two) times daily. 01/22/17   Velvet Bathe, MD  metoprolol (LOPRESSOR) 100 MG tablet Take 1 tablet (100 mg total) by mouth 2 (two) times daily. 01/22/17   Velvet Bathe, MD  oxyCODONE (OXY IR/ROXICODONE) 5 MG immediate release tablet Take 1 tablet (5 mg total) by mouth every 6 (six) hours as needed for moderate pain. 01/12/17   Theodis Blaze, MD  pantoprazole (PROTONIX) 40 MG tablet Take 1 tablet (40 mg total) by mouth daily at 12 noon. 01/12/17   Theodis Blaze, MD  QUEtiapine (SEROQUEL) 100 MG tablet Take 1 tablet (100 mg total) by mouth at bedtime. 01/12/17   Theodis Blaze, MD  simvastatin (ZOCOR) 40 MG tablet Take 1 tablet (40 mg total) by mouth daily at 6 PM. 01/22/17   Velvet Bathe, MD    Family History Family History  Problem Relation Age of Onset  . Stroke Mother   . Hypertension Brother   . Hypertension Brother   . Hypertension Brother   . Allergic rhinitis Neg Hx   . Angioedema Neg Hx   . Asthma Neg Hx   . Atopy Neg Hx   . Eczema Neg Hx   . Immunodeficiency Neg Hx   . Urticaria Neg Hx     Social History Social History  Substance Use Topics  . Smoking status: Current Every Day Smoker    Packs/day: 0.50    Types: Cigarettes  . Smokeless tobacco: Never Used  . Alcohol use No     Allergies   Cymbalta [duloxetine hcl]; Mucinex [guaifenesin er]; Nyquil multi-symptom  [pseudoeph-doxylamine-dm-apap]; Paxil [paroxetine hcl]; Promethazine; Tramadol; Wellbutrin [bupropion]; and Zoloft [sertraline]   Review of Systems Review of Systems  Ten systems reviewed and are negative for acute change, except as noted in the HPI.   Physical Exam Updated Vital Signs BP 112/75 (BP Location: Right Arm)   Pulse 70   Temp 98.6 F (37 C) (Oral)   Resp 19   SpO2 97%   Physical Exam  Constitutional: She is oriented to person, place, and time. She appears well-developed and well-nourished. No distress.  HENT:  Head: Normocephalic and atraumatic.  Mouth/Throat: Oropharynx is clear and moist.  Eyes: Conjunctivae and EOM are normal.  Pupils are equal, round, and reactive to light. No scleral icterus.  No horizontal, vertical or rotational nystagmus  Neck: Normal range of motion. Neck supple.  Full active and passive ROM without pain No midline or paraspinal tenderness No nuchal rigidity or meningeal signs  Cardiovascular: Normal rate, regular rhythm and intact distal pulses.   Pulmonary/Chest: Effort normal and breath sounds normal. No respiratory distress. She has no wheezes. She has no rales.  Left chest wall incision sites without signs of infection. Appear well healing.  Abdominal: Soft. Bowel sounds are normal. There is no tenderness. There is no rebound and no guarding.  Musculoskeletal: Normal range of motion.  Lymphadenopathy:    She has no cervical adenopathy.  Neurological: She is alert and oriented to person, place, and time. No cranial nerve deficit. She exhibits normal muscle tone. Coordination normal.  Mental Status:  Alert, oriented, thought content appropriate. Speech fluent without evidence of aphasia. Able to follow 2 step commands without difficulty.  Cranial Nerves:  II:  Peripheral visual fields grossly normal, pupils equal, round, reactive to light III,IV, VI: ptosis not present, extra-ocular motions intact bilaterally  V,VII: smile symmetric,  facial light touch sensation equal VIII: hearing grossly normal bilaterally  IX,X: midline uvula rise  XI: bilateral shoulder shrug equal and strong XII: midline tongue extension  Motor:  5/5 in upper and lower extremities bilaterally including strong and equal grip strength and dorsiflexion/plantar flexion Sensory: Pinprick and light touch normal in all extremities.  Cerebellar: normal finger-to-nose with bilateral upper extremities Gait: normal gait and balance CV: distal pulses palpable throughout   Skin: Skin is warm and dry. No rash noted. She is not diaphoretic.  Psychiatric: She has a normal mood and affect. Her behavior is normal. Judgment and thought content normal.  Nursing note and vitals reviewed.    ED Treatments / Results  Labs (all labs ordered are listed, but only abnormal results are displayed) Labs Reviewed  BASIC METABOLIC PANEL - Abnormal; Notable for the following:       Result Value   Chloride 100 (*)    Glucose, Bld 106 (*)    All other components within normal limits  CBC - Abnormal; Notable for the following:    RBC 3.47 (*)    Hemoglobin 9.8 (*)    HCT 31.1 (*)    RDW 16.2 (*)    Platelets 588 (*)    All other components within normal limits  I-STAT TROPOININ, ED    EKG  EKG Interpretation  Date/Time:  Friday January 23 2017 20:56:44 EST Ventricular Rate:  78 PR Interval:  114 QRS Duration: 70 QT Interval:  416 QTC Calculation: 474 R Axis:   65 Text Interpretation:  Normal sinus rhythm Possible Left atrial enlargement Borderline ECG T wave inversion Lateral leads When compared with ECG of 01/15/2017, Premature ventricular complexes are no longer present T wave inversion Lateral leads is now present Confirmed by [facility]  MD, DAVID ([id number]) on 01/23/2017 11:06:16 PM       Radiology Dg Chest 2 View  Result Date: 01/23/2017 CLINICAL DATA:  Chest pain with deep breaths. EXAM: CHEST  2 VIEW COMPARISON:  01/15/2017 chest radiograph.  12/25/2016 chest CT  FINDINGS: Stable normal cardiac silhouette given projection and technique. Stable blunting of left costal diaphragmatic angle and left-sided pleural thickening. Stable left mid lung zone pulmonary cavitary lesion. No new focal consolidation. No pneumothorax. Bones are unremarkable. Right upper quadrant cholecystectomy clips. IMPRESSION: Stable blunting of left costal diaphragmatic angle of  left-sided pleural thickening. Stable left mid lung zone cavitary lesion. No new acute pulmonary process. Electronically Signed   By: Kristine Garbe M.D.   On: 01/23/2017 21:32    Procedures Procedures (including critical care time)  Medications Ordered in ED Medications  oxyCODONE (Oxy IR/ROXICODONE) immediate release tablet 15 mg (not administered)     Initial Impression / Assessment and Plan / ED Course  I have reviewed the triage vital signs and the nursing notes.  Pertinent labs & imaging results that were available during my care of the patient were reviewed by me and considered in my medical decision making (see chart for details).  Clinical Course as of Jan 24 730  Sat Jan 24, 2017  0447 Patient continues to c/o eye pain. Review of EMR shows that she had similar complaints at onset of PRESS. I have discussed the case with Dr. Roxanne Mins who recommends adding a sed rate.   [AH]    Clinical Course User Index [AH] Margarita Mail, PA-C   Patient sedimentation rate is only slightly elevated. I have discussed the findings with Dr. Roxanne Mins. Patient appears safe for discharge. Patient has persistent confusion since her event and being discharged. She does not wish to go back to her nursing facility, however, do not feel she has the capacity to make that decision and patient will be returned to the [facility] skilled nursing facility. Final Clinical Impressions(s) / ED Diagnoses   Final diagnoses:  Bad headache  Eye pain, left    New Prescriptions New Prescriptions   No medications on file      Margarita Mail, PA-C [id number]    David Glick, MD [id number]

## 2017-01-24 NOTE — Progress Notes (Signed)
CSW received word from Sheila @[facility] that they will not refuse pt's re-admission.  Pt is agreeable to tx.  RN informed.

## 2017-01-24 NOTE — ED Notes (Signed)
I spoke on the phone with a nurse from [facility] and she stated, "she was told in report she wasn't accepting this patient back". Staff unable to tell me why she cant come back. Will follow up with social work.

## 2017-01-24 NOTE — ED Notes (Signed)
Pt given sandwich and coke per Erin(RN)

## 2017-01-27 ENCOUNTER — Ambulatory Visit: Payer: Self-pay | Admitting: Cardiothoracic Surgery

## 2017-01-27 LAB — FUNGUS CULTURE WITH STAIN

## 2017-01-27 LAB — FUNGUS CULTURE RESULT

## 2017-01-27 LAB — FUNGAL ORGANISM REFLEX

## 2017-02-06 ENCOUNTER — Emergency Department (HOSPITAL_COMMUNITY): Payer: Medicaid Other

## 2017-02-06 ENCOUNTER — Encounter (HOSPITAL_COMMUNITY): Payer: Self-pay | Admitting: *Deleted

## 2017-02-06 ENCOUNTER — Inpatient Hospital Stay (HOSPITAL_COMMUNITY)
Admission: EM | Admit: 2017-02-06 | Discharge: 2017-02-10 | DRG: 072 | Disposition: A | Discharge status: Home Health | Payer: Medicaid Other | Attending: Family Medicine | Admitting: Family Medicine

## 2017-02-06 DIAGNOSIS — F17210 Nicotine dependence, cigarettes, uncomplicated: Secondary | ICD-10-CM | POA: Diagnosis present

## 2017-02-06 DIAGNOSIS — Z6828 Body mass index (BMI) 28.0-28.9, adult: Secondary | ICD-10-CM

## 2017-02-06 DIAGNOSIS — E669 Obesity, unspecified: Secondary | ICD-10-CM | POA: Diagnosis present

## 2017-02-06 DIAGNOSIS — G894 Chronic pain syndrome: Secondary | ICD-10-CM | POA: Diagnosis present

## 2017-02-06 DIAGNOSIS — M1990 Unspecified osteoarthritis, unspecified site: Secondary | ICD-10-CM | POA: Diagnosis present

## 2017-02-06 DIAGNOSIS — N189 Chronic kidney disease, unspecified: Secondary | ICD-10-CM | POA: Diagnosis present

## 2017-02-06 DIAGNOSIS — Z79899 Other long term (current) drug therapy: Secondary | ICD-10-CM

## 2017-02-06 DIAGNOSIS — R071 Chest pain on breathing: Secondary | ICD-10-CM | POA: Diagnosis present

## 2017-02-06 DIAGNOSIS — J45909 Unspecified asthma, uncomplicated: Secondary | ICD-10-CM | POA: Diagnosis not present

## 2017-02-06 DIAGNOSIS — I10 Essential (primary) hypertension: Secondary | ICD-10-CM | POA: Diagnosis not present

## 2017-02-06 DIAGNOSIS — F319 Bipolar disorder, unspecified: Secondary | ICD-10-CM | POA: Diagnosis not present

## 2017-02-06 DIAGNOSIS — E785 Hyperlipidemia, unspecified: Secondary | ICD-10-CM | POA: Diagnosis present

## 2017-02-06 DIAGNOSIS — R079 Chest pain, unspecified: Secondary | ICD-10-CM | POA: Diagnosis not present

## 2017-02-06 DIAGNOSIS — I6783 Posterior reversible encephalopathy syndrome: Principal | ICD-10-CM | POA: Diagnosis present

## 2017-02-06 DIAGNOSIS — Z765 Malingerer [conscious simulation]: Secondary | ICD-10-CM

## 2017-02-06 DIAGNOSIS — Z8673 Personal history of transient ischemic attack (TIA), and cerebral infarction without residual deficits: Secondary | ICD-10-CM

## 2017-02-06 DIAGNOSIS — F419 Anxiety disorder, unspecified: Secondary | ICD-10-CM | POA: Diagnosis present

## 2017-02-06 DIAGNOSIS — J449 Chronic obstructive pulmonary disease, unspecified: Secondary | ICD-10-CM | POA: Diagnosis present

## 2017-02-06 DIAGNOSIS — I639 Cerebral infarction, unspecified: Secondary | ICD-10-CM | POA: Diagnosis not present

## 2017-02-06 DIAGNOSIS — I129 Hypertensive chronic kidney disease with stage 1 through stage 4 chronic kidney disease, or unspecified chronic kidney disease: Secondary | ICD-10-CM | POA: Diagnosis present

## 2017-02-06 DIAGNOSIS — R531 Weakness: Secondary | ICD-10-CM

## 2017-02-06 DIAGNOSIS — G43909 Migraine, unspecified, not intractable, without status migrainosus: Secondary | ICD-10-CM | POA: Diagnosis present

## 2017-02-06 LAB — CBC WITH DIFFERENTIAL/PLATELET
BASOS ABS: 0.1 10*3/uL (ref 0.0–0.1)
BASOS PCT: 1 %
Basophils Absolute: 0.1 10*3/uL (ref 0.0–0.1)
Basophils Relative: 1 %
Eosinophils Absolute: 0.1 10*3/uL (ref 0.0–0.7)
Eosinophils Absolute: 0.1 10*3/uL (ref 0.0–0.7)
Eosinophils Relative: 1 %
Eosinophils Relative: 1 %
HCT: 34.2 % — ABNORMAL LOW (ref 36.0–46.0)
HCT: 33.3 % — ABNORMAL LOW (ref 36.0–46.0)
HEMOGLOBIN: 10.5 g/dL — AB (ref 12.0–15.0)
Hemoglobin: 10.7 g/dL — ABNORMAL LOW (ref 12.0–15.0)
Lymphocytes Relative: 26 %
Lymphocytes Relative: 31 %
Lymphs Abs: 1.3 10*3/uL (ref 0.7–4.0)
Lymphs Abs: 1.6 10*3/uL (ref 0.7–4.0)
MCH: 28.7 pg (ref 26.0–34.0)
MCH: 28.8 pg (ref 26.0–34.0)
MCHC: 31.3 g/dL (ref 30.0–36.0)
MCHC: 31.5 g/dL (ref 30.0–36.0)
MCV: 91.7 fL (ref 78.0–100.0)
MCV: 91.2 fL (ref 78.0–100.0)
Monocytes Absolute: 0.5 10*3/uL (ref 0.1–1.0)
Monocytes Absolute: 0.5 10*3/uL (ref 0.1–1.0)
Monocytes Relative: 10 %
Monocytes Relative: 10 %
NEUTROS PCT: 57 %
Neutro Abs: 3.2 10*3/uL (ref 1.7–7.7)
Neutro Abs: 2.9 10*3/uL (ref 1.7–7.7)
Neutrophils Relative %: 62 %
Platelets: 284 10*3/uL (ref 150–400)
Platelets: 261 10*3/uL (ref 150–400)
RBC: 3.73 MIL/uL — ABNORMAL LOW (ref 3.87–5.11)
RBC: 3.65 MIL/uL — ABNORMAL LOW (ref 3.87–5.11)
RDW: 16 % — ABNORMAL HIGH (ref 11.5–15.5)
RDW: 16 % — ABNORMAL HIGH (ref 11.5–15.5)
WBC: 5.1 10*3/uL (ref 4.0–10.5)
WBC: 5.1 10*3/uL (ref 4.0–10.5)

## 2017-02-06 LAB — BASIC METABOLIC PANEL
Anion gap: 10 (ref 5–15)
BUN: 14 mg/dL (ref 6–20)
CO2: 24 mmol/L (ref 22–32)
Calcium: 9.5 mg/dL (ref 8.9–10.3)
Chloride: 106 mmol/L (ref 101–111)
Creatinine, Ser: 0.8 mg/dL (ref 0.44–1.00)
GFR calc Af Amer: >60 mL/min (ref >60)
GFR calc non Af Amer: >60 mL/min (ref >60)
Glucose, Bld: 96 mg/dL (ref 65–99)
Potassium: 4.6 mmol/L (ref 3.5–5.1)
Sodium: 140 mmol/L (ref 135–145)

## 2017-02-06 LAB — PROTIME-INR
INR: 1
PROTHROMBIN TIME: 13.2 s (ref 11.4–15.2)

## 2017-02-06 LAB — I-STAT TROPONIN, ED
Troponin i, poc: 0 ng/mL (ref 0.00–0.08)
Troponin i, poc: 0 ng/mL (ref 0.00–0.08)

## 2017-02-06 LAB — APTT: aPTT: 31 seconds (ref 24–36)

## 2017-02-06 MED ORDER — METOCLOPRAMIDE HCL 5 MG/ML IJ SOLN
10.0000 mg | Freq: Once | INTRAMUSCULAR | Status: AC
  Administered 2017-02-06: 10 mg via INTRAVENOUS
  Filled 2017-02-06: qty 2

## 2017-02-06 MED ORDER — OXYCODONE HCL 5 MG PO TABS
5.0000 mg | ORAL_TABLET | Freq: Once | ORAL | Status: AC
  Administered 2017-02-06: 5 mg via ORAL
  Filled 2017-02-06: qty 1

## 2017-02-06 MED ORDER — KETOROLAC TROMETHAMINE 30 MG/ML IJ SOLN
30.0000 mg | Freq: Once | INTRAMUSCULAR | Status: AC
  Administered 2017-02-06: 30 mg via INTRAVENOUS
  Filled 2017-02-06: qty 1

## 2017-02-06 MED ORDER — KETOROLAC TROMETHAMINE 15 MG/ML IJ SOLN
15.0000 mg | Freq: Once | INTRAMUSCULAR | Status: AC
  Administered 2017-02-06: 15 mg via INTRAVENOUS
  Filled 2017-02-06: qty 1

## 2017-02-06 MED ORDER — KETOROLAC TROMETHAMINE 15 MG/ML IJ SOLN: 15.0000 mg | Freq: Once | INTRAMUSCULAR | Status: DC

## 2017-02-06 MED ORDER — KETOROLAC TROMETHAMINE 30 MG/ML IJ SOLN: 15.0000 mg | Freq: Once | INTRAMUSCULAR | Status: DC

## 2017-02-06 MED ORDER — METOPROLOL TARTRATE 25 MG PO TABS
100.0000 mg | ORAL_TABLET | Freq: Once | ORAL | Status: AC
  Administered 2017-02-06: 100 mg via ORAL
  Filled 2017-02-06: qty 4

## 2017-02-06 NOTE — ED Notes (Signed)
Hospitalist at bedside 

## 2017-02-06 NOTE — ED Notes (Signed)
Pt returned from MRI °

## 2017-02-06 NOTE — Consult Note (Signed)
Neurology Consultation Reason for Consult: New infarcts Referring Physician: Long, J  CC: Headache  History is obtained from: Patient  HPI: [name] is a 51 y.o. 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 She was discharged on March 1, and has been in the nursing home since that time. She left the nursing home yesterday, stating that she was not getting good care there, and since it was an [facility] discharge they would not give her a list of her medications or give her prescriptions. She therefore did not have her blood pressure medicines today. She states that she does have tingling which started in her left arm and then spread throughout her left side.  She began noticing left-sided tingling and pain as well as severe left-sided headache with photophobia and nausea  Sometime today. She is unable to give an exact timing.  Apparently, there has been some confusion ever since she left the hospital.  LKW: Unclear tpa given?: no, unclear time of onset    ROS: A 14 point ROS was performed and is negative except as noted in the HPI.   Past Medical History:  Diagnosis Date  . Allergic rhinitis   . Arthritis   . Asthma   . Back pain   . Bipolar 1 disorder ([place])   . C. difficile diarrhea   . Cavitary lesion of lung   . Chronic low back pain 03/06/2014  . COPD (chronic obstructive pulmonary disease) ([place])   . Depression   . Fibroid   . Hypertension   . Hypomagnesemia   . IBS (irritable bowel syndrome)   . Migraine   . Scoliosis      Family History  Problem Relation Age of Onset  . Stroke Mother   . Hypertension Brother   . Hypertension Brother   . Hypertension Brother   . Allergic rhinitis Neg Hx   . Angioedema Neg Hx   . Asthma Neg Hx   . Atopy Neg Hx   . Eczema Neg Hx   . Immunodeficiency Neg Hx   . Urticaria Neg Hx      Social History:  reports that she has been smoking Cigarettes.  She has been smoking about 0.50  packs per day. She has never used smokeless tobacco. She reports that she does not drink alcohol or use drugs.   Exam: Current vital signs: BP (!) 169/86 (BP Location: Left Arm)   Pulse 86   Temp 98.2 F (36.8 C)   Resp 18   Ht 5\' 2"  (1.575 m)   Wt 73 kg (161 lb)   SpO2 93%   BMI 29.45 kg/m  Vital signs in last 24 hours: Temp:  [98.1 F (36.7 C)-98.2 F (36.8 C)] 98.2 F (36.8 C) (03/16 2204) Pulse Rate:  [69-89] 86 (03/16 2209) Resp:  [13-18] 18 (03/16 2209) BP: (159-181)/(86-99) 169/86 (03/16 2209) SpO2:  [91 %-100 %] 93 % (03/16 2209) Weight:  [73 kg (161 lb)] 73 kg (161 lb) (03/16 1524)   Physical Exam  Constitutional: Appears well-developed and well-nourished.  Psych: Affect appropriate to situation Eyes: No scleral injection HENT: No OP obstrucion Head: Normocephalic.  Cardiovascular: Normal rate and regular rhythm.  Respiratory: Effort normal and breath sounds normal to anterior ascultation GI: Soft.  No distension. There is no tenderness.  Skin: WDI  Neuro: Mental Status: Patient is awake, alert, oriented to person, place, month, year, and situation. Patient is able to give a clear and coherent history. No signs of  aphasia or neglect Cranial Nerves: II: Visual Fields are full(this is an improvement from last time I examined her during the hospital stay). Pupils are equal, round, and reactive to light.   III,IV, VI: EOMI without ptosis or diploplia.  V: Facial sensation is symmetric to temperature VII: Facial movement is symmetric.  VIII: hearing is intact to voice X: Uvula elevates symmetrically XI: Shoulder shrug is symmetric. XII: tongue is midline without atrophy or fasciculations.  Motor: She gives poor effort in all 4 extremities, but is able to give symmetric strength to confrontation. Sensory: She states she feels less on the left side than the right. Cerebellar: FNF intact bilaterally   I have reviewed labs in epic and the results pertinent  to this consultation are: BMP-unremarkable  I have reviewed the images obtained: MRI brain-improving signs of posterior reversible encephalopathy syndrome.  Impression: 51 year old female with several punctate infarcts in the setting of pres. I do not think that these are the cause of her symptoms. I suspect that these infarcts are associated with press, but given that they're new since her most recent scan I do think at least an echocardiogram as well as evaluation of the cervical carotids would be prudent. She had and cranial CTA during her previous hospitalization evaluating for possible RCVS. Pending the studies, antiplatelet therapy may be reasonable given that the pres seems to be improving.  One other possibility would be that she has worsening symptoms due to hypertension because of lack of treatment after leaving her nursing home.  Her description of photophobic unilateral headache associated with nausea and migrating paresthesia sounds very much like migraine and I will treat for this as well.  Recommendations: 1) carotid Dopplers 2) echocardiogram 3) ASA 325 mg daily 4) blood pressure control with goal less than 008 systolic 5) Reglan 10 mg IV, Toradol 30 mg IV for headache 6) stroke team to follow   Roland Rack, MD [facility] [phone]  If 7pm- 7am, please page neurology on call as listed in [place].

## 2017-02-06 NOTE — H&P (Signed)
[name] LZJ:[id number] DOB: 05-01-1966 DOA: 02/06/2017     PCP: [name], MD   Outpatient Specialists: Pulmonology  Patient coming from: [facility]   Chief Complaint: headache, chest pain, shortness of breath  HPI: [name] is a 51 y.o. female with medical history significant of Seizures, HTN, CKD, PRES, Bipolar,  Chronic pain syndrome, hx of Empyema, COPD    Presented with significant headache nausea reports bilateral leg pain some chest discomfort on the left  And left side weakness. She is unable to provide details about symptoms started just states after lunch.  reports chest pain is worse over the left lower ribs and worse if he'll touch it. Reminiscent and persistent ever since she had her surgery for cavitary lesion. Patient apparently recently was discharged from [place] home to home. She hasn't been given her medications at discharge apparently Family and patient is confused which medications she supposed to be on. Daughter was trying to take her to PCP when she started complaining about headaches and wanted to go to emergency department  Regarding pertinent Chronic problems: The beginning of March patient was admitted for a seizure secondary to progress started on Keppra Has history of hypertension poorly controlled   IN ER:  Temp (24hrs), Avg:98.2 F (36.8 C), Min:98.1 F (36.7 C), Max:98.2 F (36.8 C)       RR 18 93% Hr 86 BP 169/86 Trop 0.00 WBC 5.1 Hg 10.5 INR 1.0 Cr 0.8 MRI brain - 4 punctate foci of low diffusion in the left posterior frontal and parietal lobes compatible with acute early subacute ischemia Small sip at a focus of probable subacute infarction market interval diminution of findings of PRESS Following Medications were ordered in ER: Medications  metoCLOPramide (REGLAN) injection 10 mg (not administered)  ketorolac (TORADOL) 30 MG/ML injection 30 mg (not administered)  ketorolac (TORADOL) 15 MG/ML injection 15 mg  (15 mg Intravenous Given 02/06/17 1722)  oxyCODONE (Oxy IR/ROXICODONE) immediate release tablet 5 mg (5 mg Oral Given 02/06/17 1843)  oxyCODONE (Oxy IR/ROXICODONE) immediate release tablet 5 mg (5 mg Oral Given 02/06/17 2210)  metoprolol tartrate (LOPRESSOR) tablet 100 mg (100 mg Oral Given 02/06/17 2215)     ER provider discussed case with: Neurology  Thinks CVA is most likely secondary to hypertension recommend BP management   Hospitalist was called for admission for CVA Review of Systems:    Pertinent positives include: chest pain, leg pain, left side weakness  Constitutional:  No weight loss, night sweats, Fevers, chills, fatigue, weight loss  HEENT:  No headaches, Difficulty swallowing,Tooth/dental problems,Sore throat,  No sneezing, itching, ear ache, nasal congestion, post nasal drip,  Cardio-vascular:  No , Orthopnea, PND, anasarca, dizziness, palpitations.no Bilateral lower extremity swelling  GI:  No heartburn, indigestion, abdominal pain, nausea, vomiting, diarrhea, change in bowel habits, loss of appetite, melena, blood in stool, hematemesis Resp:  no shortness of breath at rest. No dyspnea on exertion, No excess mucus, no productive cough, No non-productive cough, No coughing up of blood.No change in color of mucus.No wheezing. Skin:  no rash or lesions. No jaundice GU:  no dysuria, change in color of urine, no urgency or frequency. No straining to urinate.  No flank pain.  Musculoskeletal:  No joint pain or no joint swelling. No decreased range of motion. No back pain.  Psych:  No change in mood or affect. No depression or anxiety. No memory loss.  Neuro: no localizing neurological complaints, no tingling, no weakness, no  double vision, no gait abnormality, no slurred speech, no confusion  As per HPI otherwise 10 point review of systems negative.   Past Medical History: Past Medical History:  Diagnosis Date  . Allergic rhinitis   . Arthritis   . Asthma   . Back  pain   . Bipolar 1 disorder ([place])   . C. difficile diarrhea   . Cavitary lesion of lung   . Chronic low back pain 03/06/2014  . COPD (chronic obstructive pulmonary disease) ([place])   . Depression   . Fibroid   . Hypertension   . Hypomagnesemia   . IBS (irritable bowel syndrome)   . Migraine   . Scoliosis    Past Surgical History:  Procedure Laterality Date  . CHOLECYSTECTOMY    . DILATION AND CURETTAGE OF UTERUS    . EXPLORATORY LAPAROTOMY     WHEN PATIENT WAS YOUNG  . RIGHT FOOT SURGERY    . TUBAL LIGATION Bilateral   . VIDEO ASSISTED THORACOSCOPY (VATS)/DECORTICATION Left 12/26/2016   Procedure: LEFT VIDEO ASSISTED THORACOSCOPY (VATS)/DECORTICATION;  Surgeon: Ivin Poot, MD;  Location: [facility];  Service: Thoracic;  Laterality: Left;  Marland Kitchen VIDEO BRONCHOSCOPY N/A 12/26/2016   Procedure: VIDEO BRONCHOSCOPY WITH BRONCHIAL LAVAGE;  Surgeon: Ivin Poot, MD;  Location: [facility];  Service: Thoracic;  Laterality: N/A;     Social History:  Ambulatory  Walker or wheelchair   reports that she has been smoking Cigarettes.  She has been smoking about 0.50 packs per day. She has never used smokeless tobacco. She reports that she does not drink alcohol or use drugs.  Allergies:   Allergies  Allergen Reactions  . Cymbalta [Duloxetine Hcl] Other (See Comments)    Mood changes...mean  . Mucinex [Guaifenesin Er] Other (See Comments)    Possible allergy, per patient (??)  . Nyquil Multi-Symptom [Pseudoeph-Doxylamine-Dm-Apap] Hives  . Paxil [Paroxetine Hcl] Other (See Comments)    Mean and aggresive  . Promethazine Itching  . Tramadol Itching  . Wellbutrin [Bupropion] Other (See Comments)    Rapid heartrate  . Zoloft [Sertraline] Other (See Comments)    Anger       Family History:   Family History  Problem Relation Age of Onset  . Stroke Mother   . Hypertension Brother   . Hypertension Brother   . Hypertension Brother   . Allergic rhinitis Neg Hx   . Angioedema Neg Hx   .  Asthma Neg Hx   . Atopy Neg Hx   . Eczema Neg Hx   . Immunodeficiency Neg Hx   . Urticaria Neg Hx     Medications: Prior to Admission medications   Medication Sig Start Date End Date Taking? Authorizing Provider  budesonide (PULMICORT) 0.5 MG/2ML nebulizer solution Take 2 mLs (0.5 mg total) by nebulization 2 (two) times daily. 01/12/17   Theodis Blaze, MD  hydrALAZINE (APRESOLINE) 100 MG tablet Take 1 tablet (100 mg total) by mouth every 6 (six) hours. 01/22/17   Velvet Bathe, MD  HYDROcodone-homatropine [facility]) 5-1.5 MG/5ML syrup Take 5 mLs by mouth every 4 (four) hours as needed for cough. 01/12/17   Theodis Blaze, MD  ipratropium-albuterol (DUONEB) 0.5-2.5 (3) MG/3ML SOLN Take 3 mLs by nebulization every 6 (six) hours as needed. 01/12/17   Theodis Blaze, MD  levETIRAcetam (KEPPRA) 1000 MG tablet Take 1 tablet (1,000 mg total) by mouth 2 (two) times daily. 01/22/17   Velvet Bathe, MD  metoprolol (LOPRESSOR) 100 MG tablet Take 1 tablet (100  mg total) by mouth 2 (two) times daily. 01/22/17   Velvet Bathe, MD  oxyCODONE (OXY IR/ROXICODONE) 5 MG immediate release tablet Take 1 tablet (5 mg total) by mouth every 6 (six) hours as needed for moderate pain. 01/12/17   Theodis Blaze, MD  pantoprazole (PROTONIX) 40 MG tablet Take 1 tablet (40 mg total) by mouth daily at 12 noon. 01/12/17   Theodis Blaze, MD  QUEtiapine (SEROQUEL) 100 MG tablet Take 1 tablet (100 mg total) by mouth at bedtime. 01/12/17   Theodis Blaze, MD  simvastatin (ZOCOR) 40 MG tablet Take 1 tablet (40 mg total) by mouth daily at 6 PM. 01/22/17   Velvet Bathe, MD    Physical Exam: Patient Vitals for the past 24 hrs:  BP Temp Temp src Pulse Resp SpO2 Height Weight  02/06/17 2209 (!) 169/86 - - 86 18 93 % - -  02/06/17 2204 - 98.2 F (36.8 C) - - - - - -  02/06/17 1930 (!) 162/94 - - 89 14 91 % - -  02/06/17 1845 (!) 159/99 - - 85 16 95 % - -  02/06/17 1700 (!) 181/90 - - 83 17 96 % - -  02/06/17 1600 (!) 169/96 - - 82 13 95 % - -    02/06/17 1529 (!) 166/94 98.1 F (36.7 C) Oral 69 18 100 % - -  02/06/17 1524 - - - - - - 5\' 2"  (1.575 m) 73 kg (161 lb)    1. General:  in No Acute distress 2. Psychological: Alert and Oriented 3. Head/ENT:     Dry Mucous Membranes                          Head Non traumatic, neck supple                           Poor Dentition 4. SKIN:   decreased Skin turgor,  Skin clean Dry and intact no rash 5. Heart: Regular rate and rhythm no  Murmur, Rub or gallop 6. Lungs:   no wheezes soem crackles   7. Abdomen: Soft,  non-tender, Non distended 8. Lower extremities: no clubbing, cyanosis, or edema 9. Neurologically Grossly intact, moving all 4 extremities equally   strength 4 out of 5 in lower extremities seem decreased effort   cranial nerves II through XII intact 10. MSK: Normal range of motion   body mass index is 29.45 kg/m.  Labs on Admission:   Labs on Admission: I have personally reviewed following labs and imaging studies  CBC:  Recent Labs Lab 02/06/17 1547 02/06/17 1841  WBC 5.1 5.1  NEUTROABS 3.2 2.9  HGB 10.7* 10.5*  HCT 34.2* 33.3*  MCV 91.7 91.2  PLT 284 585   Basic Metabolic Panel:  Recent Labs Lab 02/06/17 1547  NA 140  K 4.6  CL 106  CO2 24  GLUCOSE 96  BUN 14  CREATININE 0.80  CALCIUM 9.5   GFR: Estimated Creatinine Clearance: 77 mL/min (by C-G formula based on SCr of 0.8 mg/dL). Liver Function Tests: No results for input(s): AST, ALT, ALKPHOS, BILITOT, PROT, ALBUMIN in the last 168 hours. No results for input(s): LIPASE, AMYLASE in the last 168 hours. No results for input(s): AMMONIA in the last 168 hours. Coagulation Profile:  Recent Labs Lab 02/06/17 1730  INR 1.00   Cardiac Enzymes: No results for input(s): CKTOTAL, CKMB, CKMBINDEX, TROPONINI in  the last 168 hours. BNP (last 3 results) No results for input(s): PROBNP in the last 8760 hours. HbA1C: No results for input(s): HGBA1C in the last 72 hours. CBG: No results for  input(s): GLUCAP in the last 168 hours. Lipid Profile: No results for input(s): CHOL, HDL, LDLCALC, TRIG, CHOLHDL, LDLDIRECT in the last 72 hours. Thyroid Function Tests: No results for input(s): TSH, T4TOTAL, FREET4, T3FREE, THYROIDAB in the last 72 hours. Anemia Panel: No results for input(s): VITAMINB12, FOLATE, FERRITIN, TIBC, IRON, RETICCTPCT in the last 72 hours.  Sepsis Labs: @LABRCNTIP (procalcitonin:4,lacticidven:4) )No results found for this or any previous visit (from the past 240 hour(s)).    UA  ordered  Lab Results  Component Value Date   HGBA1C 5.4 01/16/2017    Estimated Creatinine Clearance: 77 mL/min (by C-G formula based on SCr of 0.8 mg/dL).  BNP (last 3 results) No results for input(s): PROBNP in the last 8760 hours.   ECG REPORT  Independently reviewed Rate:82  Rhythm: NSR ST&T Change: No acute ischemic changes   QTC 458  Filed Weights   02/06/17 1524  Weight: 73 kg (161 lb)     Cultures:    Component Value Date/Time   SDES BLOOD LEFT ARM 01/16/2017 1551   SDES BLOOD LEFT ARM 01/16/2017 1551   SPECREQUEST IN PEDIATRIC BOTTLE 3CC 01/16/2017 1551   SPECREQUEST IN PEDIATRIC BOTTLE 2CC 01/16/2017 1551   CULT NO GROWTH 5 DAYS 01/16/2017 1551   CULT NO GROWTH 5 DAYS 01/16/2017 1551   REPTSTATUS 01/21/2017 FINAL 01/16/2017 1551   REPTSTATUS 01/21/2017 FINAL 01/16/2017 1551     Radiological Exams on Admission: Dg Chest 2 View  Result Date: 02/06/2017 CLINICAL DATA:  Short of breath and confusion.  Lethargy. EXAM: CHEST  2 VIEW COMPARISON:  02/12/2017 FINDINGS: Normal mediastinum and cardiac silhouette. Normal pulmonary vasculature. No evidence of infiltrate, or pneumothorax. Small effusion on the LEFT. No acute bony abnormality. Healed rib fractures on the LEFT and RIGHT. IMPRESSION: 1. Small LEFT effusion. 2. Bilateral healed rib fractures. 3. No pulmonary edema, pneumothorax or infiltrate Electronically Signed   By: Suzy Bouchard M.D.   On:  02/06/2017 16:40   Ct Head Wo Contrast  Result Date: 02/06/2017 CLINICAL DATA:  Headache for 2 days.  Stroke earlier this year. EXAM: CT HEAD WITHOUT CONTRAST TECHNIQUE: Contiguous axial images were obtained from the base of the skull through the vertex without intravenous contrast. COMPARISON:  Head CTA 01/24/2017 and MRI and 01/16/2017 FINDINGS: Brain: Regions of hypoattenuation in the posterior cerebral hemispheres bilaterally and high left frontal lobe on the prior CT have largely resolved, with possible mild residual hypoattenuation in the left occipital lobe. Patchy cerebral white matter hypodensities elsewhere are nonspecific but likely reflect the sequelae of chronic small vessel ischemia. A chronic infarct is again noted in the anterior left frontal lobe. There is no definite evidence of acute cortically based infarct, acute intracranial hemorrhage, mass, midline shift, or extra-axial fluid collection. The ventricles are unchanged in size, without evidence of hydrocephalus. Vascular: No hyperdense vessel or unexpected calcification. Skull: No fracture or focal osseous lesion. Sinuses/Orbits: Paranasal sinuses and mastoid air cells are clear. Unremarkable orbits. Other: None. IMPRESSION: 1. No evidence of acute intracranial abnormality. 2. Interval resolution/near complete resolution of bilateral cerebral hypodensities compatible with resolved PRES. 3. Chronic small vessel ischemic disease and chronic left frontal lobe infarct. Electronically Signed   By: Logan Bores M.D.   On: 02/06/2017 18:28   Mr Brain Wo Contrast  Result Date: 02/06/2017  CLINICAL DATA:  51 y/o  F; severe headache with left-sided weakness. EXAM: MRI HEAD WITHOUT CONTRAST TECHNIQUE: Multiplanar, multiecho pulse sequences of the brain and surrounding structures were obtained without intravenous contrast. COMPARISON:  02/06/2017 CT of the head.  01/16/2017 MRI of the head. FINDINGS: Brain: 4 punctate foci of low diffusion within the  left posterior frontal and parietal lobes are compatible with acute/ early subacute ischemia. Small focus of diffusion hyperintensity with intermediate ADC in the left occipital lobe likely representing subacute infarction. Small chronic infarct within the left inferolateral frontal lobe. Marked diminution in T2 FLAIR hyperintense signal abnormality from prior MRI of the brain with mild residual cortical FLAIR signal abnormality in the left posterior frontal, left parietal, left greater than right occipital lobes. Background of scattered T2 FLAIR hyperintense foci in subcortical and periventricular white matter compatible with moderate chronic microvascular ischemic changes. Mild brain parenchymal volume loss. Vascular: Normal flow voids. Skull and upper cervical spine: Normal marrow signal. Sinuses/Orbits: Negative. Other: None. IMPRESSION: 1. Four punctate foci of low diffusion in left posterior frontal and parietal lobes compatible with acute/early subacute ischemia new from prior MRI. 2. Small left occipital focus probable subacute infarction. 3. Marked interval diminution in prior findings of PRES with mild residual cortical FLAIR signal abnormality in left posterior frontal, left parietal, and bilateral occipital lobes. 4. Background of moderate chronic microvascular ischemic changes and mild parenchymal volume loss of the brain. These results will be called to the ordering clinician or representative by the Radiologist Assistant, and communication documented in the PACS or zVision Dashboard. Electronically Signed   By: Kristine Garbe M.D.   On: 02/06/2017 21:49    Chart has been reviewed    Assessment/Plan  51 y.o. female with medical history significant of Seizures, HTN, CKD, PRES, Bipolar,  Chronic pain syndrome  with supportive control hypertension and CVA  Present on Admission: . CVA (cerebral vascular accident) [facility]) recently had had a echogram and carotid Dopplers will not repeat  appreciate neurology consult per them  Continue home medications and control blood pressure . Bipolar I disorder ([place]) stable continue home medications . Essential hypertension - continue home medication mantain SBP <160 . Asthma stable continue home medications . Chest pain - atypical but given risk factors will cycle cardiac enzymes   Other plan as per orders.  DVT prophylaxis:  SCD     Code Status:  FULL CODE  as per patient    Family Communication:   Family not  at  Bedside    Disposition Plan:    likely will need placement for rehabilitation  Would benefit from PT/OT eval prior to DC   ordered                                            Consults called: neurology aware    Admission status:  obs   Level of care    tele     I have spent a total of 70min on this admission   Tymira Horkey 02/06/2017, 11:48 PM    [facility]  Pager [phone]   after 2 AM please page floor coverage PA If 7AM-7PM, please contact the day team taking care of the patient  [email or web address]  Password [email or web address]

## 2017-02-06 NOTE — ED Provider Notes (Signed)
10:00 PM 51 y/o female with multiple medical conditions presents for multiple complaints, primarily a severe headache with left sided weakness. Care assumed from [facility], PA-C at shift change with MRI pending.  MRI reviewed which shows four punctate foci of low diffusion in the left posterior frontal and parietal lobes compatible with early/acute subacute ischemia which is new compared to prior MRI. There is also a small left occipital focus probable for subacute infarction. Case discussed with Dr. Leonel Ramsay who has evaluated the patient. He believes that the patient's MRI findings are a result of high blood pressure. He advises aggressive blood pressure management, but does not believe the patient requires Cardene gtt. Will consult [facility] for admission.  10:43 PM  Case discussed with Dr. Roel Cluck of [facility] who will evaluate the patient for admission.   Antonietta Breach, PA-C 02/06/17 [place], MD 02/07/17 [phone]

## 2017-02-06 NOTE — ED Notes (Signed)
Attempted report x1. 

## 2017-02-06 NOTE — ED Triage Notes (Signed)
Patient comes in per [facility] with c/o sob, left lower lobe pain. Month ago removed left lower lobe of lung. Fm [place]. LS clear. Patient feels nauseous and h/a with bil leg and foot pain. 8 mg zofran with minimal relief. 22 in left hand. In hospital receiving 10 of oxycodone then 5 of oxycodone at [facility] and now hasn't received it in last two days. EMS v/s stable, 95% on RA, 150/110. Hx htn. EMS ekg unremarkable.

## 2017-02-06 NOTE — ED Provider Notes (Signed)
[facility] DEPT Provider Note   CSN: [id number] Arrival date & time: 02/06/17  1518   History   Chief Complaint Chief Complaint  Patient presents with  . Shortness of Breath  . Chest Pain    HPI [name] is a 51 y.o. female.  HPI   51 year old female presents today with numerous complaints.  Patient continues to report that she "does not know".  When describing her history she reports that "I just found this out today but I have had strokes in the past".  She continues to use this phrase throughout to explain what has been going on.  Patient initially reported that she was having chest pain and shortness of breath.  She reports the pain is over the left lower ribs, worse with palpation, worse with inspiration.  Patient has had the symptoms since surgery for a cavitary lesion.  Patient reports she is also having severe headache, diffusely with left-sided weakness, numbness and tingling.  She also reports numbness and tingling in her right lower extremity.  She is uncertain when this started.  I spoke with her daughter on the phone who reports that she was just recently discharged from [facility] yesterday.  She notes that while they are she was not receiving her medications as directed.  When she was discharged she was not given her medications at discharge, not given a list of what medication she is supposed to take.  She notes patient does not live with her but lives at home with her boyfriend.  She reports patient has taken her blood pressure medication, gabapentin, but is not taking any other medications as she does not know which one she supposed to take.  Patient's daughter went over to her house today to pick her up to see the primary care for follow-up visit.  Patient was reporting headache and wanted to be seen in the emergency room.  Past Medical History:  Diagnosis Date  . Allergic rhinitis   . Arthritis   . Asthma   . Back pain   . Bipolar 1 disorder  ([place])   . C. difficile diarrhea   . Cavitary lesion of lung   . Chronic low back pain 03/06/2014  . COPD (chronic obstructive pulmonary disease) ([place])   . Depression   . Fibroid   . Hypertension   . Hypomagnesemia   . IBS (irritable bowel syndrome)   . Migraine   . Scoliosis     Patient Active Problem List   Diagnosis Date Noted  . Palliative care by specialist   . Goals of care, counseling/discussion   . Acute renal failure ([place])   . Bipolar I disorder ([place])   . Chronic pain syndrome   . PRES (posterior reversible encephalopathy syndrome)   . Acute encephalopathy   . Visual disturbance   . Seizure ([place]) 01/15/2017  . Hypokalemia 01/15/2017  . Renal insufficiency 01/15/2017  . Hyperglycemia 01/15/2017  . H/O chest tube placement   . Candida infection   . Coagulase-negative staphylococcal infection   . Asthma 01/03/2017  . Seasonal allergies 01/03/2017  . Anemia 01/03/2017  . Herpes labialis 01/03/2017  . Empyema of pleural space ([place]) 12/26/2016  . Abscess of lower lobe of left lung with pneumonia ([place])   . Ileus ([place])   . Pneumothorax   . Pneumonia of left upper lobe due to Streptococcus pneumoniae ([place])   . Clostridium difficile colitis   . Acute kidney injury ([place])   . HCAP (healthcare-associated pneumonia)   .  Transaminitis   . Pressure injury of skin 12/19/2016  . MDD (major depressive disorder), recurrent severe, without psychosis ([place]) 12/22/2015  . Acute respiratory failure ([place]) 12/13/2015  . IBS (irritable bowel syndrome) 12/13/2015  . COPD with acute exacerbation ([place])   . Essential hypertension   . Encephalopathy acute 12/03/2015  . Chronic narcotic dependence ([place]) 11/22/2015  . Hypercalcemia 11/22/2015  . Chronic hyponatremia 03/07/2015  . Cigarette nicotine dependence without complication 27/25/3664  . Fibromyalgia syndrome 01/09/2015  . Mixed hyperlipidemia 01/09/2015  . Chronic low back pain 03/06/2014    Past Surgical History:  Procedure  Laterality Date  . CHOLECYSTECTOMY    . DILATION AND CURETTAGE OF UTERUS    . EXPLORATORY LAPAROTOMY     WHEN PATIENT WAS YOUNG  . RIGHT FOOT SURGERY    . TUBAL LIGATION Bilateral   . VIDEO ASSISTED THORACOSCOPY (VATS)/DECORTICATION Left 12/26/2016   Procedure: LEFT VIDEO ASSISTED THORACOSCOPY (VATS)/DECORTICATION;  Surgeon: Ivin Poot, MD;  Location: [facility];  Service: Thoracic;  Laterality: Left;  Marland Kitchen VIDEO BRONCHOSCOPY N/A 12/26/2016   Procedure: VIDEO BRONCHOSCOPY WITH BRONCHIAL LAVAGE;  Surgeon: Ivin Poot, MD;  Location: [facility] OR;  Service: Thoracic;  Laterality: N/A;    OB History    No data available       Home Medications    Prior to Admission medications   Medication Sig Start Date End Date Taking? Authorizing Provider  budesonide (PULMICORT) 0.5 MG/2ML nebulizer solution Take 2 mLs (0.5 mg total) by nebulization 2 (two) times daily. 01/12/17   Theodis Blaze, MD  hydrALAZINE (APRESOLINE) 100 MG tablet Take 1 tablet (100 mg total) by mouth every 6 (six) hours. 01/22/17   Velvet Bathe, MD  HYDROcodone-homatropine [facility]) 5-1.5 MG/5ML syrup Take 5 mLs by mouth every 4 (four) hours as needed for cough. 01/12/17   Theodis Blaze, MD  ipratropium-albuterol (DUONEB) 0.5-2.5 (3) MG/3ML SOLN Take 3 mLs by nebulization every 6 (six) hours as needed. 01/12/17   Theodis Blaze, MD  levETIRAcetam (KEPPRA) 1000 MG tablet Take 1 tablet (1,000 mg total) by mouth 2 (two) times daily. 01/22/17   Velvet Bathe, MD  metoprolol (LOPRESSOR) 100 MG tablet Take 1 tablet (100 mg total) by mouth 2 (two) times daily. 01/22/17   Velvet Bathe, MD  oxyCODONE (OXY IR/ROXICODONE) 5 MG immediate release tablet Take 1 tablet (5 mg total) by mouth every 6 (six) hours as needed for moderate pain. 01/12/17   Theodis Blaze, MD  pantoprazole (PROTONIX) 40 MG tablet Take 1 tablet (40 mg total) by mouth daily at 12 noon. 01/12/17   Theodis Blaze, MD  QUEtiapine (SEROQUEL) 100 MG tablet Take 1 tablet (100 mg total) by mouth at  bedtime. 01/12/17   Theodis Blaze, MD  simvastatin (ZOCOR) 40 MG tablet Take 1 tablet (40 mg total) by mouth daily at 6 PM. 01/22/17   Velvet Bathe, MD    Family History Family History  Problem Relation Age of Onset  . Stroke Mother   . Hypertension Brother   . Hypertension Brother   . Hypertension Brother   . Allergic rhinitis Neg Hx   . Angioedema Neg Hx   . Asthma Neg Hx   . Atopy Neg Hx   . Eczema Neg Hx   . Immunodeficiency Neg Hx   . Urticaria Neg Hx     Social History Social History  Substance Use Topics  . Smoking status: Current Every Day Smoker    Packs/day: 0.50  Types: Cigarettes  . Smokeless tobacco: Never Used  . Alcohol use No     Allergies   Cymbalta [duloxetine hcl]; Mucinex [guaifenesin er]; Nyquil multi-symptom [pseudoeph-doxylamine-dm-apap]; Paxil [paroxetine hcl]; Promethazine; Tramadol; Wellbutrin [bupropion]; and Zoloft [sertraline]   Review of Systems Review of Systems   Physical Exam Updated Vital Signs BP (!) 159/99   Pulse 85   Temp 98.1 F (36.7 C) (Oral)   Resp 16   Ht 5\' 2"  (1.575 m)   Wt 73 kg   SpO2 95%   BMI 29.45 kg/m   Physical Exam  Constitutional: She appears well-developed and well-nourished. No distress.  HENT:  Head: Normocephalic.  Eyes:  Patient will not open her eyes for exam, after manually opening the eyes pupils are equal round reactive to light  Neck: Normal range of motion.  Cardiovascular: Normal rate.   Pulmonary/Chest: Effort normal. No respiratory distress. She has no wheezes. She has no rales. She exhibits no tenderness.  Patient has tenderness palpation of the left lower ribs, no signs of trauma  Musculoskeletal: Normal range of motion. She exhibits no edema.  No lower extremity edema  Neurological:  She is alert and oriented-she will not follow commands.  She moves her hands independently, but when asked to move them she reports she is unable to.  Patient reports decreased sensation to the left  upper left lower and right lower extremity diffusely     ED Treatments / Results  Labs (all labs ordered are listed, but only abnormal results are displayed) Labs Reviewed  CBC WITH DIFFERENTIAL/PLATELET - Abnormal; Notable for the following:       Result Value   RBC 3.73 (*)    Hemoglobin 10.7 (*)    HCT 34.2 (*)    RDW 16.0 (*)    All other components within normal limits  CBC WITH DIFFERENTIAL/PLATELET - Abnormal; Notable for the following:    RBC 3.65 (*)    Hemoglobin 10.5 (*)    HCT 33.3 (*)    RDW 16.0 (*)    All other components within normal limits  BASIC METABOLIC PANEL  APTT  PROTIME-INR  I-STAT TROPOININ, ED  I-STAT TROPOININ, ED    EKG  EKG Interpretation None       Radiology Dg Chest 2 View  Result Date: 02/06/2017 CLINICAL DATA:  Short of breath and confusion.  Lethargy. EXAM: CHEST  2 VIEW COMPARISON:  02/12/2017 FINDINGS: Normal mediastinum and cardiac silhouette. Normal pulmonary vasculature. No evidence of infiltrate, or pneumothorax. Small effusion on the LEFT. No acute bony abnormality. Healed rib fractures on the LEFT and RIGHT. IMPRESSION: 1. Small LEFT effusion. 2. Bilateral healed rib fractures. 3. No pulmonary edema, pneumothorax or infiltrate Electronically Signed   By: Suzy Bouchard M.D.   On: 02/06/2017 16:40   Ct Head Wo Contrast  Result Date: 02/06/2017 CLINICAL DATA:  Headache for 2 days.  Stroke earlier this year. EXAM: CT HEAD WITHOUT CONTRAST TECHNIQUE: Contiguous axial images were obtained from the base of the skull through the vertex without intravenous contrast. COMPARISON:  Head CTA 01/24/2017 and MRI and 01/16/2017 FINDINGS: Brain: Regions of hypoattenuation in the posterior cerebral hemispheres bilaterally and high left frontal lobe on the prior CT have largely resolved, with possible mild residual hypoattenuation in the left occipital lobe. Patchy cerebral white matter hypodensities elsewhere are nonspecific but likely reflect the  sequelae of chronic small vessel ischemia. A chronic infarct is again noted in the anterior left frontal lobe. There is no definite evidence  of acute cortically based infarct, acute intracranial hemorrhage, mass, midline shift, or extra-axial fluid collection. The ventricles are unchanged in size, without evidence of hydrocephalus. Vascular: No hyperdense vessel or unexpected calcification. Skull: No fracture or focal osseous lesion. Sinuses/Orbits: Paranasal sinuses and mastoid air cells are clear. Unremarkable orbits. Other: None. IMPRESSION: 1. No evidence of acute intracranial abnormality. 2. Interval resolution/near complete resolution of bilateral cerebral hypodensities compatible with resolved PRES. 3. Chronic small vessel ischemic disease and chronic left frontal lobe infarct. Electronically Signed   By: Logan Bores M.D.   On: 02/06/2017 18:28    Procedures Procedures (including critical care time)  Medications Ordered in ED Medications  oxyCODONE (Oxy IR/ROXICODONE) immediate release tablet 5 mg (not administered)  ketorolac (TORADOL) 15 MG/ML injection 15 mg (15 mg Intravenous Given 02/06/17 1722)  oxyCODONE (Oxy IR/ROXICODONE) immediate release tablet 5 mg (5 mg Oral Given 02/06/17 1843)     Initial Impression / Assessment and Plan / ED Course  I have reviewed the triage vital signs and the nursing notes.  Pertinent labs & imaging results that were available during my care of the patient were reviewed by me and considered in my medical decision making (see chart for details).      Final Clinical Impressions(s) / ED Diagnoses   Final diagnoses:  Left-sided weakness    Labs: I-STAT troponin, CBC, APTT, CBC, BMP  Imaging: CT head without contrast  Consults: Neurology  Therapeutics: Toradol, oxycodone  Discharge Meds:   Assessment/Plan: 51 year old female presents with numerous complaints.  Upon my initial evaluation she is complaining of left lower rib pain worse with  palpation worse with inspiration.  She has clear lung sounds normal oxygenation is not tachycardic.  I have very low suspicion for PE in this patient.  This is likely chest wall pain.  Patient has no significant findings that would indicate this is ACS, she will have a delta troponin as well.   Patient would not originally open her eyes for my exam, would not move her extremities reporting weakness and decreased sensation.  She would move her hands across her body when I was not asking her to but would not do this on command.  She reported decreased sensation to the left upper and lower extremity and right lower extremity.  Her presentation is not consistent with intracranial abnormality.  But due to the fact that she would not follow commands and giving her significant past history CT head was ordered.  CT head showed no acute findings.  Neurology was consulted.  They recommended perfusion scan CT angios head and neck.  I stepped back into the room to inform the patient of the plan, she had her left hand up to her ear using her cell phone in her right hand behind the back of her head.  I was very happy to see that she was able to move her extremities with her eyes open.  She was able to track her eyes from side to side with no visual field deficits.  She reports the weakness in the extremities is improving, but still has decreased sensation.  I consulted neurology again who recommended MRI without contrast has patient is able to perform important neurological functions.    Patient CARE will be signed out to oncoming provider pending MRI, delta troponin and disposition.     New Prescriptions New Prescriptions   No medications on file     Okey Regal, PA-C 02/06/17 2047    Courteney Julio Alm, MD 02/06/17  2136  

## 2017-02-06 NOTE — ED Notes (Signed)
Patient at CT

## 2017-02-06 NOTE — ED Notes (Signed)
Patient transported to MRI 

## 2017-02-07 ENCOUNTER — Encounter (HOSPITAL_COMMUNITY): Payer: Self-pay | Admitting: General Practice

## 2017-02-07 DIAGNOSIS — R51 Headache: Secondary | ICD-10-CM | POA: Diagnosis present

## 2017-02-07 DIAGNOSIS — F419 Anxiety disorder, unspecified: Secondary | ICD-10-CM | POA: Diagnosis present

## 2017-02-07 DIAGNOSIS — F319 Bipolar disorder, unspecified: Secondary | ICD-10-CM | POA: Diagnosis present

## 2017-02-07 DIAGNOSIS — Z765 Malingerer [conscious simulation]: Secondary | ICD-10-CM | POA: Diagnosis not present

## 2017-02-07 DIAGNOSIS — I129 Hypertensive chronic kidney disease with stage 1 through stage 4 chronic kidney disease, or unspecified chronic kidney disease: Secondary | ICD-10-CM | POA: Diagnosis present

## 2017-02-07 DIAGNOSIS — R071 Chest pain on breathing: Secondary | ICD-10-CM | POA: Diagnosis present

## 2017-02-07 DIAGNOSIS — G894 Chronic pain syndrome: Secondary | ICD-10-CM | POA: Diagnosis present

## 2017-02-07 DIAGNOSIS — G43909 Migraine, unspecified, not intractable, without status migrainosus: Secondary | ICD-10-CM | POA: Diagnosis present

## 2017-02-07 DIAGNOSIS — J449 Chronic obstructive pulmonary disease, unspecified: Secondary | ICD-10-CM | POA: Diagnosis present

## 2017-02-07 DIAGNOSIS — Z8673 Personal history of transient ischemic attack (TIA), and cerebral infarction without residual deficits: Secondary | ICD-10-CM | POA: Diagnosis not present

## 2017-02-07 DIAGNOSIS — E669 Obesity, unspecified: Secondary | ICD-10-CM | POA: Diagnosis present

## 2017-02-07 DIAGNOSIS — Z79899 Other long term (current) drug therapy: Secondary | ICD-10-CM | POA: Diagnosis not present

## 2017-02-07 DIAGNOSIS — I10 Essential (primary) hypertension: Secondary | ICD-10-CM | POA: Diagnosis not present

## 2017-02-07 DIAGNOSIS — I6783 Posterior reversible encephalopathy syndrome: Secondary | ICD-10-CM | POA: Diagnosis present

## 2017-02-07 DIAGNOSIS — E785 Hyperlipidemia, unspecified: Secondary | ICD-10-CM | POA: Diagnosis present

## 2017-02-07 DIAGNOSIS — R531 Weakness: Secondary | ICD-10-CM | POA: Diagnosis not present

## 2017-02-07 DIAGNOSIS — I639 Cerebral infarction, unspecified: Secondary | ICD-10-CM | POA: Diagnosis present

## 2017-02-07 DIAGNOSIS — N189 Chronic kidney disease, unspecified: Secondary | ICD-10-CM | POA: Diagnosis present

## 2017-02-07 DIAGNOSIS — M1990 Unspecified osteoarthritis, unspecified site: Secondary | ICD-10-CM | POA: Diagnosis present

## 2017-02-07 DIAGNOSIS — Z6828 Body mass index (BMI) 28.0-28.9, adult: Secondary | ICD-10-CM | POA: Diagnosis not present

## 2017-02-07 DIAGNOSIS — F17210 Nicotine dependence, cigarettes, uncomplicated: Secondary | ICD-10-CM | POA: Diagnosis present

## 2017-02-07 DIAGNOSIS — R079 Chest pain, unspecified: Secondary | ICD-10-CM | POA: Diagnosis not present

## 2017-02-07 LAB — RAPID URINE DRUG SCREEN, HOSP PERFORMED
AMPHETAMINES: NOT DETECTED
BENZODIAZEPINES: NOT DETECTED
Barbiturates: NOT DETECTED
COCAINE: NOT DETECTED
OPIATES: NOT DETECTED
TETRAHYDROCANNABINOL: NOT DETECTED

## 2017-02-07 LAB — LIPID PANEL
Cholesterol: 244 mg/dL — ABNORMAL HIGH (ref 0–200)
HDL: 59 mg/dL (ref >40)
LDL CALC: 155 mg/dL — AB (ref 0–99)
Total CHOL/HDL Ratio: 4.1 RATIO
Triglycerides: 151 mg/dL — ABNORMAL HIGH (ref <150)
VLDL: 30 mg/dL (ref 0–40)

## 2017-02-07 LAB — URINALYSIS, ROUTINE W REFLEX MICROSCOPIC
Bacteria, UA: NONE SEEN
Bilirubin Urine: NEGATIVE
GLUCOSE, UA: NEGATIVE mg/dL
HGB URINE DIPSTICK: NEGATIVE
Ketones, ur: NEGATIVE mg/dL
Nitrite: NEGATIVE
PROTEIN: NEGATIVE mg/dL
SQUAMOUS EPITHELIAL / LPF: NONE SEEN
Specific Gravity, Urine: 1.02 (ref 1.005–1.030)
pH: 5 (ref 5.0–8.0)

## 2017-02-07 LAB — TROPONIN I: Troponin I: <0.03 ng/mL (ref <0.03)

## 2017-02-07 MED ORDER — SIMVASTATIN 40 MG PO TABS
40.0000 mg | ORAL_TABLET | Freq: Every day | ORAL | Status: DC
  Administered 2017-02-07 – 2017-02-09 (×3): 40 mg via ORAL
  Filled 2017-02-07 (×3): qty 1

## 2017-02-07 MED ORDER — ACETAMINOPHEN 160 MG/5ML PO SOLN: 650.0000 mg | ORAL | Status: DC | PRN

## 2017-02-07 MED ORDER — VALACYCLOVIR HCL 500 MG PO TABS
500.0000 mg | ORAL_TABLET | Freq: Every day | ORAL | Status: DC
  Administered 2017-02-07 – 2017-02-10 (×4): 500 mg via ORAL
  Filled 2017-02-07 (×4): qty 1

## 2017-02-07 MED ORDER — METOPROLOL TARTRATE 100 MG PO TABS
100.0000 mg | ORAL_TABLET | Freq: Two times a day (BID) | ORAL | Status: DC
  Administered 2017-02-07 – 2017-02-10 (×7): 100 mg via ORAL
  Filled 2017-02-07 (×7): qty 1

## 2017-02-07 MED ORDER — ONDANSETRON HCL 4 MG/2ML IJ SOLN
4.0000 mg | Freq: Four times a day (QID) | INTRAMUSCULAR | Status: DC | PRN
Start: 2017-02-07 — End: 2017-02-10
  Administered 2017-02-07 – 2017-02-10 (×4): 4 mg via INTRAVENOUS
  Filled 2017-02-07 (×6): qty 2

## 2017-02-07 MED ORDER — STROKE: EARLY STAGES OF RECOVERY BOOK
Freq: Once | Status: AC
  Administered 2017-02-07: 1

## 2017-02-07 MED ORDER — IPRATROPIUM-ALBUTEROL 0.5-2.5 (3) MG/3ML IN SOLN: 3.0000 mL | Freq: Four times a day (QID) | RESPIRATORY_TRACT | Status: DC

## 2017-02-07 MED ORDER — LEVETIRACETAM 500 MG PO TABS
1000.0000 mg | ORAL_TABLET | Freq: Two times a day (BID) | ORAL | Status: DC
  Administered 2017-02-07 – 2017-02-10 (×8): 1000 mg via ORAL
  Filled 2017-02-07 (×9): qty 2

## 2017-02-07 MED ORDER — QUETIAPINE FUMARATE 50 MG PO TABS
100.0000 mg | ORAL_TABLET | Freq: Every day | ORAL | Status: DC
  Administered 2017-02-07 – 2017-02-09 (×4): 100 mg via ORAL
  Filled 2017-02-07 (×5): qty 2

## 2017-02-07 MED ORDER — HYDRALAZINE HCL 50 MG PO TABS
100.0000 mg | ORAL_TABLET | Freq: Four times a day (QID) | ORAL | Status: DC
Start: 2017-02-07 — End: 2017-02-10
  Administered 2017-02-07 – 2017-02-10 (×14): 100 mg via ORAL
  Filled 2017-02-07 (×14): qty 2

## 2017-02-07 MED ORDER — MAGNESIUM OXIDE 400 (241.3 MG) MG PO TABS
400.0000 mg | ORAL_TABLET | Freq: Two times a day (BID) | ORAL | Status: DC
  Administered 2017-02-07 – 2017-02-10 (×7): 400 mg via ORAL
  Filled 2017-02-07 (×7): qty 1

## 2017-02-07 MED ORDER — ASPIRIN 300 MG RE SUPP: 300.0000 mg | Freq: Every day | RECTAL | Status: DC

## 2017-02-07 MED ORDER — IPRATROPIUM-ALBUTEROL 0.5-2.5 (3) MG/3ML IN SOLN
3.0000 mL | Freq: Two times a day (BID) | RESPIRATORY_TRACT | Status: DC
  Administered 2017-02-07 – 2017-02-08 (×3): 3 mL via RESPIRATORY_TRACT
  Filled 2017-02-07 (×3): qty 3

## 2017-02-07 MED ORDER — ASPIRIN 325 MG PO TABS
325.0000 mg | ORAL_TABLET | Freq: Every day | ORAL | Status: DC
  Administered 2017-02-07 – 2017-02-10 (×4): 325 mg via ORAL
  Filled 2017-02-07 (×4): qty 1

## 2017-02-07 MED ORDER — CLONAZEPAM 0.5 MG PO TABS
0.5000 mg | ORAL_TABLET | Freq: Once | ORAL | Status: AC
  Administered 2017-02-07: 0.5 mg via ORAL
  Filled 2017-02-07: qty 1

## 2017-02-07 MED ORDER — FAMOTIDINE 20 MG PO TABS
20.0000 mg | ORAL_TABLET | Freq: Two times a day (BID) | ORAL | Status: DC
  Administered 2017-02-07 – 2017-02-10 (×6): 20 mg via ORAL
  Filled 2017-02-07 (×6): qty 1

## 2017-02-07 MED ORDER — BUDESONIDE 0.5 MG/2ML IN SUSP
0.5000 mg | Freq: Two times a day (BID) | RESPIRATORY_TRACT | Status: DC
  Administered 2017-02-07 – 2017-02-08 (×2): 0.5 mg via RESPIRATORY_TRACT
  Filled 2017-02-07 (×8): qty 2

## 2017-02-07 MED ORDER — ACETAMINOPHEN 325 MG PO TABS
650.0000 mg | ORAL_TABLET | ORAL | Status: DC | PRN
  Administered 2017-02-07 – 2017-02-10 (×7): 650 mg via ORAL
  Filled 2017-02-07 (×8): qty 2

## 2017-02-07 MED ORDER — OXYCODONE HCL 5 MG PO TABS
5.0000 mg | ORAL_TABLET | Freq: Four times a day (QID) | ORAL | Status: DC | PRN
  Administered 2017-02-07 – 2017-02-10 (×12): 5 mg via ORAL
  Filled 2017-02-07 (×12): qty 1

## 2017-02-07 MED ORDER — PANTOPRAZOLE SODIUM 40 MG PO TBEC
40.0000 mg | DELAYED_RELEASE_TABLET | Freq: Every day | ORAL | Status: DC
  Administered 2017-02-07 – 2017-02-10 (×4): 40 mg via ORAL
  Filled 2017-02-07 (×4): qty 1

## 2017-02-07 MED ORDER — ACETAMINOPHEN 650 MG RE SUPP: 650.0000 mg | RECTAL | Status: DC | PRN

## 2017-02-07 MED ORDER — SODIUM CHLORIDE 0.9 % IV SOLN
INTRAVENOUS | Status: DC
  Administered 2017-02-07: 1000 mL via INTRAVENOUS

## 2017-02-07 NOTE — Progress Notes (Signed)
STROKE TEAM PROGRESS NOTE   HISTORY OF PRESENT ILLNESS (per record) Suprina S Schutt is a 51 y.o. 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 She was discharged on March 1, and has been in the nursing home since that time. She left the nursing home yesterday, stating that she was not getting good care there, and since it was an [facility] discharge they would not give her a list of her medications or give her prescriptions. She therefore did not have her blood pressure medicines today. She states that she does have tingling which started in her left arm and then spread throughout her left side.  She began noticing left-sided tingling and pain as well as severe left-sided headache with photophobia and nausea  Sometime today. She is unable to give an exact timing.  Apparently, there has been some confusion ever since she left the hospital.  LKW: Unclear tpa given?: no, unclear time of onset   SUBJECTIVE (INTERVAL HISTORY) Her family was not at the bedside.  She complains of severe HA in the frontal area and pressure behind the eyes.  Also feels nauseated   OBJECTIVE Temp:  [97.9 F (36.6 C)-99.1 F (37.3 C)] 98 F (36.7 C) (03/17 0630) Pulse Rate:  [66-89] 74 (03/17 0946) Cardiac Rhythm: Normal sinus rhythm (03/17 0103) Resp:  [13-24] 18 (03/17 0946) BP: (102-181)/(57-99) 120/57 (03/17 0630) SpO2:  [89 %-100 %] 92 % (03/17 0946) FiO2 (%):  [21 %] 21 % (03/17 0026) Weight:  [71 kg (156 lb 8 oz)-73 kg (161 lb)] 71 kg (156 lb 8 oz) (03/17 0030)  CBC:  Recent Labs Lab 02/06/17 1547 02/06/17 1841  WBC 5.1 5.1  NEUTROABS 3.2 2.9  HGB 10.7* 10.5*  HCT 34.2* 33.3*  MCV 91.7 91.2  PLT 284 161    Basic Metabolic Panel:  Recent Labs Lab 02/06/17 1547  NA 140  K 4.6  CL 106  CO2 24  GLUCOSE 96  BUN 14  CREATININE 0.80  CALCIUM 9.5    Lipid Panel:    Component Value Date/Time   CHOL 244 (H) 02/07/2017 0110   TRIG 151 (H)  02/07/2017 0110   HDL 59 02/07/2017 0110   CHOLHDL 4.1 02/07/2017 0110   VLDL 30 02/07/2017 0110   LDLCALC 155 (H) 02/07/2017 0110   HgbA1c:  Lab Results  Component Value Date   HGBA1C 5.4 01/16/2017   Urine Drug Screen:    Component Value Date/Time   LABOPIA POSITIVE (A) 01/15/2017 1719   COCAINSCRNUR NONE DETECTED 01/15/2017 1719   LABBENZ NONE DETECTED 01/15/2017 1719   AMPHETMU NONE DETECTED 01/15/2017 1719   THCU NONE DETECTED 01/15/2017 1719   LABBARB NONE DETECTED 01/15/2017 1719      IMAGING  Dg Chest 2 View 02/06/2017 1. Small LEFT effusion.  2. Bilateral healed rib fractures.  3. No pulmonary edema, pneumothorax or infiltrate    Ct Head Wo Contrast 02/06/2017 1. No evidence of acute intracranial abnormality.  2. Interval resolution/near complete resolution of bilateral cerebral hypodensities compatible with resolved PRES.  3. Chronic small vessel ischemic disease and chronic left frontal lobe infarct.    Mr Brain Wo Contrast 02/06/2017 1. Four punctate foci of low diffusion in left posterior frontal and parietal lobes compatible with acute/early subacute ischemia new from prior MRI.  2. Small left occipital focus probable subacute infarction.  3. Marked interval diminution in prior findings of PRES with mild residual cortical FLAIR signal abnormality in left posterior frontal, left parietal, and  bilateral occipital lobes.  4. Background of moderate chronic microvascular ischemic changes and mild parenchymal volume loss of the brain.    MR MRA Head / Brain 01/16/2017 1. 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 2. Motion degraded MRA with multiple questioned anterior circulation stenoses versus motion artifact as above. Consider further evaluation with CTA as an  outpatient once the patient's acute illness has resolved and he is better able to remain motionless. 3. Hypoplastic versus occluded distal left vertebral artery. Widely patent and dominant right vertebral artery. 4. 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  CTA Head 01/24/2017 1. Normal intracranial CTA. 2. Confluent hypoattenuation throughout the periventricular white matter, predominantly within the occipital lobes and left frontal lobe. This is compatible with the diagnosis of PRES, as demonstrated on recent MRI. This may represent the sequela of that episode. 3. No specific finding to explain the reported left eye pain.  Carotid duplex Pending   PHYSICAL EXAM Physical Exam General - Well nourished, well developed, in NAD   Cardiovascular - Regular rate and rhythm Pulmonary: CTA Abdomen: NT, ND, normal bowel sounds Extremities: No C/C/E  Neurological Exam Mental Status: Normal Orientation:  Oriented to person, place and time Speech:  Fluent; no dysarthria  Cranial Nerves:  PERRL; EOMI; visual fields full, face grossly symmetric, hearing grossly intact; shrug symmetric and tongue midline  Motor Exam:  Tone:  Within normal limits; Strength: 5/5 throughout; mildly tremulous  Sensory: Intact to light touch on the left.  She reports slight decrease in sensation on the right  Coordination:  Intact finger to nose  Gait: Deferred  ASSESSMENT/PLAN Ms. Blakelyn LOWANA HABLE is a 51 y.o. female with history of PRES, hypertension, COPD, bipolar disorder, and migraine headaches  presenting with left arm tingling, confusion, severe left-sided headache, photophobia, and nausea.  She did not receive IV t-PA due to unclear time of onset.  Strokes:  Multiple strokes on the left likely embolic from an unknown source.  Resultant  Decreased sensation on the right  MRI -  Four punctate foci of low diffusion in left posterior frontal and parietal  lobes compatible with acute/early subacute ischemia new from prior MRI. Small left occipital focus probable subacute infarction.   MRA - 01/16/2017 -  Motion degraded  CTA Head - 01/24/2017 - compatible with the diagnosis of PRES o/w normal  UDS - positive for opioids in the past.  Negative this admission  Carotid Doppler - 12/14/2016 -  Bilateral carotids 1-39% category. Vertebral are patent with antegrade flow.  2D Echo - 12/13/2016 - EF - 65% to 70%. No cardiac source of emboli identified.  LDL - 155  HgbA1c - 5.4 (01/16/2017)  VTE prophylaxis - SCDs  Diet Heart Room service appropriate? Yes; Fluid consistency: Thin  No antithrombotic prior to admission, now on aspirin 325 mg daily  Patient counseled to be compliant with her antithrombotic medications  Ongoing aggressive stroke risk factor management  Therapy recommendations: pending  Disposition: Pending  Hypertension  Blood pressure tends to run low  Permissive hypertension (OK if < 220/120) but gradually normalize in 5-7 days  Long-term BP goal normotensive  Hyperlipidemia  Home meds:  Zocor 40 mg daily resumed in hospital  LDL 155, goal < 70  Confirm compliance. Consider change to Lipitor 80 mg daily.  Continue statin at discharge  Other Stroke Risk Factors  Cigarette smoker - advised to stop smoking  Obesity, Body mass index is 28.62 kg/m., recommend weight loss, diet and exercise as appropriate   Family hx stroke (Mother)  Migraines  Other Active Problems  Anemia - 10.5 / 33.3 - recheck in AM  On Keppra 1 gm [facility] day # 0  ATTENDING NOTE: Patient was seen and examined by me personally. Documentation reflects findings. The laboratory and radiographic studies reviewed by me. ROS completed by me personally and pertinent positives fully documented  Condition: stable  Assessment and plan completed by me personally and fully documented above. Plans/Recommendations include:     Magnesium  for HA  Zofran for nausea  Recent admission for PRESS included comprehensive assessment and testing that we ordinarily do for stroke.  Given embolic appearance of strokes, would repeat ECHO and consider TEE  Given age and multiple strokes, if source not found, may need hypercoagulable states and autoimmune evaluation in follow up with Stroke outpatient team  SIGNED BY: Dr. Elissa Hefty    To contact Stroke Continuity provider, please refer to [email or web address] After hours, contact General Neurology

## 2017-02-07 NOTE — Progress Notes (Signed)
Patient arrived to floor around 0030 she is alert and oriented but seems a little slow, no pain at this time starting Q 2 neuro checks and vitals ordered SCD's oriented patient to room and safety measures.

## 2017-02-07 NOTE — Progress Notes (Signed)
PROGRESS NOTE    "[name]  MRN:[id number] DOB: 02/15/1966 DOA: 02/06/2017 PCP: ARVIND,MOOGALI M, MD    Brief Narrative:   51 y.o. female with medical history significant of Seizures, HTN, CKD, PRES, Bipolar,  Chronic pain syndrome, hx of Empyema, COPD   Diagnosed with new stroke.   Assessment & Plan:   Active Problems:   Essential hypertension - Pt on hydralazine, lopressor    Asthma - no wheezes, continue pulmicort    Bipolar I disorder (HCC)   CVA (cerebral vascular accident) (HCC) - stroke work up underway - neurology consulted. - continue aspirin and statin    Chest pain - troponin normal.   DVT prophylaxis: scd's Code Status: Full Family Communication: None at bedside Disposition Plan: once cleared for d/c by neurology   Consultants:   neurology   Procedures: stroke work up underway   Antimicrobials: None   Subjective: Pt only complaint is headache for which she wants to take oxycodone  Objective: Vitals:   02/07/17 0230 02/07/17 0430 02/07/17 0630 02/07/17 0946  BP: 110/63 114/65 (!) 120/57 132/64  Pulse: 72 66 69 74  Resp: 15 14 14 18  Temp: 98.2 F (36.8 C) 97.9 F (36.6 C) 98 F (36.7 C) 98.4 F (36.9 C)  TempSrc: Oral Oral Oral Oral  SpO2: 95% 94% 94% 92%  Weight:      Height:        Intake/Output Summary (Last 24 hours) at 02/07/17 1317 Last data filed at 02/07/17 0300  Gross per 24 hour  Intake              145 ml  Output                0 ml  Net              14" 5 ml   Filed Weights   02/06/17 1524 02/07/17 0030  Weight: 73 kg (161 lb) 71 kg (156 lb 8 oz)    Examination:  General exam: Appears calm and comfortable, in nad. Respiratory system: Clear to auscultation. Respiratory effort normal. Cardiovascular system: S1 & S2 heard, RRR.  Gastrointestinal system: Abdomen is nondistended, soft and nontender. No organomegaly or masses felt. Normal bowel sounds heard.  Central nervous system: Alert and awake. No facial  asymmetry Extremities: warm, no cyanosis Skin: No rashes, lesions or ulcers, on limited exam. Psychiatry: Mood & affect appropriate.   Data Reviewed: I have personally reviewed following labs and imaging studies  CBC:  Recent Labs Lab 02/06/17 1547 02/06/17 1841  WBC 5.1 5.1  NEUTROABS 3.2 2.9  HGB 10.7* 10.5*  HCT 34.2* 33.3*  MCV 91.7 91.2  PLT 284 022   Basic Metabolic Panel:  Recent Labs Lab 02/06/17 1547  NA 140  K 4.6  CL 106  CO2 24  GLUCOSE 96  BUN 14  CREATININE 0.80  CALCIUM 9.5   GFR: Estimated Creatinine Clearance: 76 mL/min (by C-G formula based on SCr of 0.8 mg/dL). Liver Function Tests: No results for input(s): AST, ALT, ALKPHOS, BILITOT, PROT, ALBUMIN in the last 168 hours. No results for input(s): LIPASE, AMYLASE in the last 168 hours. No results for input(s): AMMONIA in the last 168 hours. Coagulation Profile:  Recent Labs Lab 02/06/17 1730  INR 1.00   Cardiac Enzymes:  Recent Labs Lab 02/07/17 0110 02/07/17 1006  TROPONINI <0.03 <0.03   BNP (last 3 results) No results for input(s): PROBNP in the last 8760 hours. HbA1C: No results for input(s): HGBA1C  in the last 72 hours. CBG: No results for input(s): GLUCAP in the last 168 hours. Lipid Profile:  Recent Labs  02/07/17 0110  CHOL 244*  HDL 59  LDLCALC 155*  TRIG 151*  CHOLHDL 4.1   Thyroid Function Tests: No results for input(s): TSH, T4TOTAL, FREET4, T3FREE, THYROIDAB in the last 72 hours. Anemia Panel: No results for input(s): VITAMINB12, FOLATE, FERRITIN, TIBC, IRON, RETICCTPCT in the last 72 hours. Sepsis Labs: No results for input(s): PROCALCITON, LATICACIDVEN in the last 168 hours.  No results found for this or any previous visit (from the past 240 hour(s)).   Radiology Studies: Dg Chest 2 View  Result Date: 02/06/2017 CLINICAL DATA:  Short of breath and confusion.  Lethargy. EXAM: CHEST  2 VIEW COMPARISON:  02/12/2017 FINDINGS: Normal mediastinum and cardiac  silhouette. Normal pulmonary vasculature. No evidence of infiltrate, or pneumothorax. Small effusion on the LEFT. No acute bony abnormality. Healed rib fractures on the LEFT and RIGHT. IMPRESSION: 1. Small LEFT effusion. 2. Bilateral healed rib fractures. 3. No pulmonary edema, pneumothorax or infiltrate Electronically Signed   By: Suzy Bouchard M.D.   On: 02/06/2017 16:40   Ct Head Wo Contrast  Result Date: 02/06/2017 CLINICAL DATA:  Headache for 2 days.  Stroke earlier this year. EXAM: CT HEAD WITHOUT CONTRAST TECHNIQUE: Contiguous axial images were obtained from the base of the skull through the vertex without intravenous contrast. COMPARISON:  Head CTA 01/24/2017 and MRI and 01/16/2017 FINDINGS: Brain: Regions of hypoattenuation in the posterior cerebral hemispheres bilaterally and high left frontal lobe on the prior CT have largely resolved, with possible mild residual hypoattenuation in the left occipital lobe. Patchy cerebral white matter hypodensities elsewhere are nonspecific but likely reflect the sequelae of chronic small vessel ischemia. A chronic infarct is again noted in the anterior left frontal lobe. There is no definite evidence of acute cortically based infarct, acute intracranial hemorrhage, mass, midline shift, or extra-axial fluid collection. The ventricles are unchanged in size, without evidence of hydrocephalus. Vascular: No hyperdense vessel or unexpected calcification. Skull: No fracture or focal osseous lesion. Sinuses/Orbits: Paranasal sinuses and mastoid air cells are clear. Unremarkable orbits. Other: None. IMPRESSION: 1. No evidence of acute intracranial abnormality. 2. Interval resolution/near complete resolution of bilateral cerebral hypodensities compatible with resolved PRES. 3. Chronic small vessel ischemic disease and chronic left frontal lobe infarct. Electronically Signed   By: Logan Bores M.D.   On: 02/06/2017 18:28   Mr Brain Wo Contrast  Result Date:  02/06/2017 CLINICAL DATA:  51 y/o  F; severe headache with left-sided weakness. EXAM: MRI HEAD WITHOUT CONTRAST TECHNIQUE: Multiplanar, multiecho pulse sequences of the brain and surrounding structures were obtained without intravenous contrast. COMPARISON:  02/06/2017 CT of the head.  01/16/2017 MRI of the head. FINDINGS: Brain: 4 punctate foci of low diffusion within the left posterior frontal and parietal lobes are compatible with acute/ early subacute ischemia. Small focus of diffusion hyperintensity with intermediate ADC in the left occipital lobe likely representing subacute infarction. Small chronic infarct within the left inferolateral frontal lobe. Marked diminution in T2 FLAIR hyperintense signal abnormality from prior MRI of the brain with mild residual cortical FLAIR signal abnormality in the left posterior frontal, left parietal, left greater than right occipital lobes. Background of scattered T2 FLAIR hyperintense foci in subcortical and periventricular white matter compatible with moderate chronic microvascular ischemic changes. Mild brain parenchymal volume loss. Vascular: Normal flow voids. Skull and upper cervical spine: Normal marrow signal. Sinuses/Orbits: Negative. Other: None. IMPRESSION:  1. Four punctate foci of low diffusion in left posterior frontal and parietal lobes compatible with acute/early subacute ischemia new from prior MRI. 2. Small left occipital focus probable subacute infarction. 3. Marked interval diminution in prior findings of PRES with mild residual cortical FLAIR signal abnormality in left posterior frontal, left parietal, and bilateral occipital lobes. 4. Background of moderate chronic microvascular ischemic changes and mild parenchymal volume loss of the brain. These results will be called to the ordering clinician or representative by the Radiologist Assistant, and communication documented in the PACS or zVision Dashboard. Electronically Signed   By: Kristine Garbe M.D.   On: 02/06/2017 21:49   Scheduled Meds: . aspirin  300 mg Rectal Daily   Or  . aspirin  325 mg Oral Daily  . budesonide  0.5 mg Nebulization BID  . hydrALAZINE  100 mg Oral Q6H  . ipratropium-albuterol  3 mL Nebulization BID  . levETIRAcetam  1,000 mg Oral BID  . metoprolol  100 mg Oral BID  . pantoprazole  40 mg Oral Q1200  . QUEtiapine  100 mg Oral QHS  . simvastatin  40 mg Oral q1800   Continuous Infusions: . sodium chloride 1,000 mL (02/07/17 0104)     LOS: 0 days   Time spent: > 35 minutes  Velvet Bathe, MD [facility] Pager [phone]  If 7PM-7AM, please contact night-coverage [email or web address] Password [email or web address] 02/07/2017, 1:17 PM

## 2017-02-07 NOTE — Evaluation (Signed)
Occupational Therapy Evaluation Patient Details Name: [name] MRN: [id number] DOB: [birth year] Today's Date: 02/07/2017    History of Present Illness Pt with recent admission for PRES. Pt presented with tingling of lt side and lt sided headache. MRI showed improving PRES and several lt punctate infarcts. Neurologist did not feel infarcts were the cause of symptoms. Pt left SNF AMA and didn't have prescription for BP meds. PMH - bipolar, copd, htn, vats   Clinical Impression   PTA, pt had been staying at a SNF for rehabilitation but left AMA and was living with her boyfriend. She currently requires supervision overall for safety during ADL. She was limited by severe headache with nausea this session. Additionally, she presents with decreased temperature sensation in her L UE and decreased safety awareness impacting her ability to participate in ADL safely. OT will continue to follow acutely in preparation for D/C home with no OT follow-up and assistance as needed from her significant other.  Of note, pt with SpO2 desaturation to 83% during ambulation which rebounded to 91% with rest. Notified RN who is monitoring.     Follow Up Recommendations  No OT follow up    Equipment Recommendations  None recommended by OT    Recommendations for Other Services       Precautions / Restrictions Precautions Precautions: None Restrictions Weight Bearing Restrictions: No      Mobility Bed Mobility Overal bed mobility: Modified Independent                Transfers Overall transfer level: Needs assistance Equipment used: None Transfers: Sit to/from Stand Sit to Stand: Supervision         General transfer comment: Supervision for safety.    Balance Overall balance assessment: No apparent balance deficits (not formally assessed)                                          ADL Overall ADL's : Needs assistance/impaired Eating/Feeding: Set up;Sitting    Grooming: Set up;Sitting   Upper Body Bathing: Supervision/ safety;Sitting   Lower Body Bathing: Supervison/ safety;Sit to/from stand   Upper Body Dressing : Supervision/safety;Sitting   Lower Body Dressing: Supervision/safety;Sit to/from stand   Toilet Transfer: Supervision/safety;Ambulation;Regular Toilet   Toileting- Water quality scientist and Hygiene: Supervision/safety;Sit to/from stand       Functional mobility during ADLs: Supervision/safety General ADL Comments: Supervision for safety during standing ADL. Willing to participate despite headache/nausea.     Vision Patient Visual Report: Blurring of vision Vision Assessment?: Vision impaired- to be further tested in functional context Additional Comments: Pt keeping eyes closed due to pain and light sensitivity. Reports blurry vision.     Perception     Praxis      Pertinent Vitals/Pain Pain Assessment: Faces Faces Pain Scale: Hurts whole lot Pain Location: head Pain Descriptors / Indicators: Grimacing;Moaning Pain Intervention(s): Limited activity within patient's tolerance;Monitored during session;Patient requesting pain meds-RN notified     Hand Dominance Right   Extremity/Trunk Assessment Upper Extremity Assessment Upper Extremity Assessment: LUE deficits/detail LUE Deficits / Details: Decreased temperature discrimination and reports tingling in her L UE. LUE Sensation: decreased light touch   Lower Extremity Assessment Lower Extremity Assessment: Defer to PT evaluation RLE Sensation:  (reports tingling) LLE Sensation:  (reports tingling)       Communication Communication Communication: No difficulties   Cognition Arousal/Alertness: Awake/alert Behavior During Therapy: WFL for  tasks assessed/performed Overall Cognitive Status: No family/caregiver present to determine baseline cognitive functioning Area of Impairment: Memory;Safety/judgement         Safety/Judgement: Decreased awareness of  safety     General Comments: Reports that she does not remember previous admission. She was keeping her eyes closed due to pain from headache.   General Comments       Exercises       Shoulder Instructions      Home Living Family/patient expects to be discharged to:: Private residence Living Arrangements: Spouse/significant other Available Help at Discharge: Friend(s);Available PRN/intermittently Type of Home: House Home Access: Stairs to enter [employer] of Steps: 3 Entrance Stairs-Rails: Right;Left;Can reach both Home Layout: One level     Bathroom Shower/Tub: Tub/shower unit Shower/tub characteristics: Architectural technologist: Standard     Home Equipment: None          Prior Functioning/Environment Level of Independence: Needs assistance  Gait / Transfers Assistance Needed: Pt reports amb independently without assistive device     Comments: Pt had left SNF AMA and reports that she was not very active but could complete basic ADL independently. She reports that she typically watches television in bed on most days.        OT Problem List: Impaired balance (sitting and/or standing);Decreased safety awareness;Decreased cognition;Pain;Impaired vision/perception      OT Treatment/Interventions: Self-care/ADL training;Therapeutic exercise;Neuromuscular education;Energy conservation;DME and/or AE instruction;Therapeutic activities;Cognitive remediation/compensation;Visual/perceptual remediation/compensation;Patient/family education;Balance training    OT Goals(Current goals can be found in the care plan section) Acute Rehab OT Goals Patient Stated Goal: to have less pain OT Goal Formulation: With patient Time For Goal Achievement: 02/21/17 Potential to Achieve Goals: Good ADL Goals Pt Will Transfer to Toilet: Independently;ambulating Pt Will Perform Toileting - Clothing Manipulation and hygiene: Independently;sit to/from stand Additional ADL Goal #1: Pt  will identify 3 methods to compensate for decreased temperature discrimination in order to improve safety while completing cooking tasks.  OT Frequency: Min 2X/week   Barriers to D/C:            Co-evaluation              End of Session Nurse Communication: Mobility status;Other (comment) (O2 desaturation to 83% with rebound to 91% with rest)  Activity Tolerance: Patient limited by pain Patient left: in bed;with call bell/phone within reach;with bed alarm set  OT Visit Diagnosis: Unsteadiness on feet (R26.81)                ADL either performed or assessed with clinical judgement  Time: 1235-1259 OT Time Calculation (min): 24 min Charges:  OT General Charges $OT Visit: 1 Procedure OT Evaluation $OT Eval Moderate Complexity: 1 Procedure OT Treatments $Self Care/Home Management : 8-22 mins G-Codes: OT G-codes **NOT FOR INPATIENT CLASS** Functional Assessment Tool Used: AM-PAC 6 Clicks Daily Activity Functional Limitation: Self care Self Care Current Status (S2831): At least 20 percent but less than 40 percent impaired, limited or restricted Self Care Goal Status (D1761): At least 1 percent but less than 20 percent impaired, limited or restricted   Norman Herrlich, [place] OTR/L  Pager: [phone] 02/07/2017, 2:05 PM

## 2017-02-07 NOTE — Evaluation (Signed)
Physical Therapy Evaluation Patient Details Name: [name] SIDER MRN: [id number] DOB: [birth year] Today's Date: 02/07/2017   History of Present Illness  Pt with recent admission for PRES. Pt presented with tingling of lt side and lt sided headache. MRI showed improving PRES and several lt punctate infarcts. Neurologist did not feel infarcts were the cause of symptoms. Pt left SNF AMA and didn't have prescription for BP meds. PMH - bipolar, copd, htn, vats  Clinical Impression  Pt doing well with mobility and no further PT needed.  Ready for dc from PT standpoint.      Follow Up Recommendations No PT follow up    Equipment Recommendations  None recommended by PT    Recommendations for Other Services       Precautions / Restrictions Precautions Precautions: None Restrictions Weight Bearing Restrictions: No      Mobility  Bed Mobility Overal bed mobility: Modified Independent                Transfers Overall transfer level: Independent                  Ambulation/Gait Ambulation/Gait assistance: Independent Ambulation Distance (Feet): 250 Feet   Gait Pattern/deviations: WFL(Within Functional Limits)   Gait velocity interpretation: Below normal speed for age/gender General Gait Details: Steady gait with or without IV pole. Turns without difficulty and no problem with obstacles  Stairs Stairs: Yes Stairs assistance: Modified independent (Device/Increase time) Stair Management: Two rails;Alternating pattern;Step to pattern;Forwards Number of Stairs: 5 General stair comments: Steady gait on stairs using rails  Wheelchair Mobility    Modified Rankin (Stroke Patients Only)       Balance Overall balance assessment: Independent                                           Pertinent Vitals/Pain Pain Assessment: 0-10 Faces Pain Scale: Hurts worst Pain Location: head Pain Descriptors / Indicators: Aching Pain Intervention(s): Limited  activity within patient's tolerance    Home Living Family/patient expects to be discharged to:: Private residence Living Arrangements: Spouse/significant other Available Help at Discharge: Friend(s);Available PRN/intermittently Type of Home: House Home Access: Stairs to enter Entrance Stairs-Rails: Right;Left;Can reach both Entrance Stairs-Number of Steps: 3 Home Layout: One level Home Equipment: None      Prior Function Level of Independence: Needs assistance   Gait / Transfers Assistance Needed: Pt reports amb independently without assistive device           Hand Dominance   Dominant Hand: Right    Extremity/Trunk Assessment   Upper Extremity Assessment Upper Extremity Assessment: Defer to OT evaluation    Lower Extremity Assessment Lower Extremity Assessment: Overall WFL for tasks assessed;RLE deficits/detail;LLE deficits/detail RLE Sensation:  (reports tingling) LLE Sensation:  (reports tingling)       Communication   Communication: No difficulties  Cognition Arousal/Alertness: Awake/alert Behavior During Therapy: WFL for tasks assessed/performed Overall Cognitive Status: Within Functional Limits for tasks assessed                      General Comments      Exercises     Assessment/Plan    PT Assessment Patent does not need any further PT services  PT Problem List         PT Treatment Interventions      PT Goals (Current goals can be found in  the Care Plan section)  Acute Rehab PT Goals PT Goal Formulation: All assessment and education complete, DC therapy    Frequency     Barriers to discharge        Co-evaluation               End of Session   Activity Tolerance: Patient tolerated treatment well Patient left: in bed;with bed alarm set;with call bell/phone within reach Nurse Communication: Mobility status PT Visit Diagnosis: Difficulty in walking, not elsewhere classified (R26.2)         Time: 2158-7276 PT Time  Calculation (min) (ACUTE ONLY): 18 min   Charges:   PT Evaluation $PT Eval Low Complexity: 1 Procedure     PT G CodesShary Decamp [place] March 07, 2017, 12:13 PM [employer] PT [phone]

## 2017-02-08 LAB — BASIC METABOLIC PANEL
Anion gap: 5 (ref 5–15)
BUN: 12 mg/dL (ref 6–20)
CO2: 27 mmol/L (ref 22–32)
CREATININE: 0.76 mg/dL (ref 0.44–1.00)
Calcium: 9.2 mg/dL (ref 8.9–10.3)
Chloride: 107 mmol/L (ref 101–111)
GFR calc Af Amer: >60 mL/min (ref >60)
GLUCOSE: 96 mg/dL (ref 65–99)
POTASSIUM: 4 mmol/L (ref 3.5–5.1)
Sodium: 139 mmol/L (ref 135–145)

## 2017-02-08 LAB — CBC
HCT: 30.4 % — ABNORMAL LOW (ref 36.0–46.0)
Hemoglobin: 9.4 g/dL — ABNORMAL LOW (ref 12.0–15.0)
MCH: 28.7 pg (ref 26.0–34.0)
MCHC: 30.9 g/dL (ref 30.0–36.0)
MCV: 92.7 fL (ref 78.0–100.0)
Platelets: 236 10*3/uL (ref 150–400)
RBC: 3.28 MIL/uL — AB (ref 3.87–5.11)
RDW: 16.1 % — ABNORMAL HIGH (ref 11.5–15.5)
WBC: 4.8 10*3/uL (ref 4.0–10.5)

## 2017-02-08 LAB — MAGNESIUM: Magnesium: 1.7 mg/dL (ref 1.7–2.4)

## 2017-02-08 MED ORDER — IPRATROPIUM-ALBUTEROL 0.5-2.5 (3) MG/3ML IN SOLN: 3.0000 mL | RESPIRATORY_TRACT | Status: DC | PRN

## 2017-02-08 MED ORDER — LORAZEPAM 1 MG PO TABS
1.0000 mg | ORAL_TABLET | Freq: Once | ORAL | Status: AC
  Administered 2017-02-08: 1 mg via ORAL
  Filled 2017-02-08: qty 1

## 2017-02-08 NOTE — Progress Notes (Signed)
PROGRESS NOTE    [name]  XBJ:[id number] DOB: 05/11/1966 DOA: 02/06/2017 PCP: [name], MD    Brief Narrative:   51 y.o. female with medical history significant of Seizures, HTN, CKD, PRES, Bipolar,  Chronic pain syndrome, hx of Empyema, COPD   Diagnosed with new stroke.  Assessment & Plan:     CVA (cerebral vascular accident) ([place]) - stroke work up underway - neurology consulted. - continue aspirin and statin - Awaiting repeat echocardiogram  Active Problems:   Essential hypertension - Pt on hydralazine, lopressor    Asthma - no wheezes, continue pulmicort    Bipolar I disorder (HCC)    Chest pain - troponin normal.   DVT prophylaxis: scd's Code Status: Full Family Communication: None at bedside Disposition Plan: once cleared for d/c by neurology, awaiting repeat echocardiogram.   Consultants:   neurology   Procedures: stroke work up underway   Antimicrobials: None   Subjective: Pt still c/o headache improved with pain medication regimen.  Objective: Vitals:   02/07/17 2112 02/08/17 0554 02/08/17 0856 02/08/17 1052  BP: (!) 173/88 (!) 151/71  (!) 110/47  Pulse: 78 69  70  Resp: 18 18  18   Temp: 98.4 F (36.9 C) 98.3 F (36.8 C)  98.1 F (36.7 C)  TempSrc: Oral Oral  Oral  SpO2: 97% 96% 93% 95%  Weight:      Height:       No intake or output data in the 24 hours ending 02/08/17 1516 Filed Weights   02/06/17 1524 02/07/17 0030  Weight: 73 kg (161 lb) 71 kg (156 lb 8 oz)    Examination:  General exam: Appears calm and comfortable, in nad. Respiratory system: Clear to auscultation. Respiratory effort normal. Cardiovascular system: S1 & S2 heard, RRR.  Gastrointestinal system: Abdomen is nondistended, soft and nontender. No organomegaly or masses felt. Normal bowel sounds heard.  Central nervous system: Alert and awake. No facial asymmetry Extremities: warm, no cyanosis Skin: No rashes, lesions or ulcers, on limited  exam. Psychiatry: Mood & affect appropriate.   Data Reviewed: I have personally reviewed following labs and imaging studies  CBC:  Recent Labs Lab 02/06/17 1547 02/06/17 1841 02/08/17 0400  WBC 5.1 5.1 4.8  NEUTROABS 3.2 2.9  --   HGB 10.7* 10.5* 9.4*  HCT 34.2* 33.3* 30.4*  MCV 91.7 91.2 92.7  PLT 284 261 308   Basic Metabolic Panel:  Recent Labs Lab 02/06/17 1547 02/08/17 0400  NA 140 139  K 4.6 4.0  CL 106 107  CO2 24 27  GLUCOSE 96 96  BUN 14 12  CREATININE 0.80 0.76  CALCIUM 9.5 9.2  MG  --  1.7   GFR: Estimated Creatinine Clearance: 76 mL/min (by C-G formula based on SCr of 0.76 mg/dL). Liver Function Tests: No results for input(s): AST, ALT, ALKPHOS, BILITOT, PROT, ALBUMIN in the last 168 hours. No results for input(s): LIPASE, AMYLASE in the last 168 hours. No results for input(s): AMMONIA in the last 168 hours. Coagulation Profile:  Recent Labs Lab 02/06/17 1730  INR 1.00   Cardiac Enzymes:  Recent Labs Lab 02/07/17 0110 02/07/17 1006 02/07/17 1322  TROPONINI <0.03 <0.03 <0.03   BNP (last 3 results) No results for input(s): PROBNP in the last 8760 hours. HbA1C: No results for input(s): HGBA1C in the last 72 hours. CBG: No results for input(s): GLUCAP in the last 168 hours. Lipid Profile:  Recent Labs  02/07/17 0110  CHOL 244*  HDL 59  LDLCALC 155*  TRIG 151*  CHOLHDL 4.1   Thyroid Function Tests: No results for input(s): TSH, T4TOTAL, FREET4, T3FREE, THYROIDAB in the last 72 hours. Anemia Panel: No results for input(s): VITAMINB12, FOLATE, FERRITIN, TIBC, IRON, RETICCTPCT in the last 72 hours. Sepsis Labs: No results for input(s): PROCALCITON, LATICACIDVEN in the last 168 hours.  No results found for this or any previous visit (from the past 240 hour(s)).   Radiology Studies: Dg Chest 2 View  Result Date: 02/06/2017 CLINICAL DATA:  Short of breath and confusion.  Lethargy. EXAM: CHEST  2 VIEW COMPARISON:  02/12/2017  FINDINGS: Normal mediastinum and cardiac silhouette. Normal pulmonary vasculature. No evidence of infiltrate, or pneumothorax. Small effusion on the LEFT. No acute bony abnormality. Healed rib fractures on the LEFT and RIGHT. IMPRESSION: 1. Small LEFT effusion. 2. Bilateral healed rib fractures. 3. No pulmonary edema, pneumothorax or infiltrate Electronically Signed   By: Suzy Bouchard M.D.   On: 02/06/2017 16:40   Ct Head Wo Contrast  Result Date: 02/06/2017 CLINICAL DATA:  Headache for 2 days.  Stroke earlier this year. EXAM: CT HEAD WITHOUT CONTRAST TECHNIQUE: Contiguous axial images were obtained from the base of the skull through the vertex without intravenous contrast. COMPARISON:  Head CTA 01/24/2017 and MRI and 01/16/2017 FINDINGS: Brain: Regions of hypoattenuation in the posterior cerebral hemispheres bilaterally and high left frontal lobe on the prior CT have largely resolved, with possible mild residual hypoattenuation in the left occipital lobe. Patchy cerebral white matter hypodensities elsewhere are nonspecific but likely reflect the sequelae of chronic small vessel ischemia. A chronic infarct is again noted in the anterior left frontal lobe. There is no definite evidence of acute cortically based infarct, acute intracranial hemorrhage, mass, midline shift, or extra-axial fluid collection. The ventricles are unchanged in size, without evidence of hydrocephalus. Vascular: No hyperdense vessel or unexpected calcification. Skull: No fracture or focal osseous lesion. Sinuses/Orbits: Paranasal sinuses and mastoid air cells are clear. Unremarkable orbits. Other: None. IMPRESSION: 1. No evidence of acute intracranial abnormality. 2. Interval resolution/near complete resolution of bilateral cerebral hypodensities compatible with resolved PRES. 3. Chronic small vessel ischemic disease and chronic left frontal lobe infarct. Electronically Signed   By: Logan Bores M.D.   On: 02/06/2017 18:28   Mr Brain  Wo Contrast  Result Date: 02/06/2017 CLINICAL DATA:  51 y/o  F; severe headache with left-sided weakness. EXAM: MRI HEAD WITHOUT CONTRAST TECHNIQUE: Multiplanar, multiecho pulse sequences of the brain and surrounding structures were obtained without intravenous contrast. COMPARISON:  02/06/2017 CT of the head.  01/16/2017 MRI of the head. FINDINGS: Brain: 4 punctate foci of low diffusion within the left posterior frontal and parietal lobes are compatible with acute/ early subacute ischemia. Small focus of diffusion hyperintensity with intermediate ADC in the left occipital lobe likely representing subacute infarction. Small chronic infarct within the left inferolateral frontal lobe. Marked diminution in T2 FLAIR hyperintense signal abnormality from prior MRI of the brain with mild residual cortical FLAIR signal abnormality in the left posterior frontal, left parietal, left greater than right occipital lobes. Background of scattered T2 FLAIR hyperintense foci in subcortical and periventricular white matter compatible with moderate chronic microvascular ischemic changes. Mild brain parenchymal volume loss. Vascular: Normal flow voids. Skull and upper cervical spine: Normal marrow signal. Sinuses/Orbits: Negative. Other: None. IMPRESSION: 1. Four punctate foci of low diffusion in left posterior frontal and parietal lobes compatible with acute/early subacute ischemia new from prior MRI. 2. Small left occipital focus probable subacute infarction.  3. Marked interval diminution in prior findings of PRES with mild residual cortical FLAIR signal abnormality in left posterior frontal, left parietal, and bilateral occipital lobes. 4. Background of moderate chronic microvascular ischemic changes and mild parenchymal volume loss of the brain. These results will be called to the ordering clinician or representative by the Radiologist Assistant, and communication documented in the PACS or zVision Dashboard. Electronically Signed    By: Kristine Garbe M.D.   On: 02/06/2017 21:49   Scheduled Meds: . aspirin  300 mg Rectal Daily   Or  . aspirin  325 mg Oral Daily  . budesonide  0.5 mg Nebulization BID  . famotidine  20 mg Oral BID  . hydrALAZINE  100 mg Oral Q6H  . ipratropium-albuterol  3 mL Nebulization BID  . levETIRAcetam  1,000 mg Oral BID  . magnesium oxide  400 mg Oral BID  . metoprolol  100 mg Oral BID  . pantoprazole  40 mg Oral Q1200  . QUEtiapine  100 mg Oral QHS  . simvastatin  40 mg Oral q1800  . valACYclovir  500 mg Oral Daily   Continuous Infusions: . sodium chloride 1,000 mL (02/07/17 0104)     LOS: 1 day   Time spent: > 35 minutes  Velvet Bathe, MD [facility] Pager [phone]  If 7PM-7AM, please contact night-coverage [email or web address] Password [facility] 02/08/2017, 3:16 PM

## 2017-02-08 NOTE — Evaluation (Signed)
Speech Language Pathology Evaluation Patient Details Name: [name] MRN: [id number] DOB: August 24, 1966 Today's Date: 02/08/2017 Time: 0149-9692 SLP Time Calculation (min) (ACUTE ONLY): 17 min  Problem List:  Patient Active Problem List   Diagnosis Date Noted  . CVA (cerebral vascular accident) ([place]) 02/07/2017  . Acute CVA (cerebrovascular accident) ([place]) 02/06/2017  . Chest pain 02/06/2017  . Palliative care by specialist   . Goals of care, counseling/discussion   . Acute renal failure ([place])   . Bipolar I disorder ([place])   . Chronic pain syndrome   . PRES (posterior reversible encephalopathy syndrome)   . Acute encephalopathy   . Visual disturbance   . Seizure ([place]) 01/15/2017  . Hypokalemia 01/15/2017  . Renal insufficiency 01/15/2017  . Hyperglycemia 01/15/2017  . H/O chest tube placement   . Candida infection   . Coagulase-negative staphylococcal infection   . Asthma 01/03/2017  . Seasonal allergies 01/03/2017  . Anemia 01/03/2017  . Herpes labialis 01/03/2017  . Empyema of pleural space ([place]) 12/26/2016  . Abscess of lower lobe of left lung with pneumonia ([place])   . Ileus ([place])   . Pneumothorax   . Pneumonia of left upper lobe due to Streptococcus pneumoniae ([place])   . Clostridium difficile colitis   . Acute kidney injury ([place])   . HCAP (healthcare-associated pneumonia)   . Transaminitis   . Pressure injury of skin 12/19/2016  . MDD (major depressive disorder), recurrent severe, without psychosis ([place]) 12/22/2015  . Acute respiratory failure ([place]) 12/13/2015  . IBS (irritable bowel syndrome) 12/13/2015  . COPD with acute exacerbation ([place])   . Essential hypertension   . Encephalopathy acute 12/03/2015  . Chronic narcotic dependence ([place]) 11/22/2015  . Hypercalcemia 11/22/2015  . Chronic hyponatremia 03/07/2015  . Cigarette nicotine dependence without complication 49/32/4199  . Fibromyalgia syndrome 01/09/2015  . Mixed hyperlipidemia 01/09/2015  . Chronic low  back pain 03/06/2014   Past Medical History:  Past Medical History:  Diagnosis Date  . Allergic rhinitis   . Arthritis   . Asthma   . Back pain   . Bipolar 1 disorder ([place])   . C. difficile diarrhea   . Cavitary lesion of lung   . Chronic low back pain 03/06/2014  . COPD (chronic obstructive pulmonary disease) ([place])   . Depression   . Fibroid   . Hypertension   . Hypomagnesemia   . IBS (irritable bowel syndrome)   . Migraine   . Scoliosis    Past Surgical History:  Past Surgical History:  Procedure Laterality Date  . CHOLECYSTECTOMY    . DILATION AND CURETTAGE OF UTERUS    . EXPLORATORY LAPAROTOMY     WHEN PATIENT WAS YOUNG  . RIGHT FOOT SURGERY    . TUBAL LIGATION Bilateral   . VIDEO ASSISTED THORACOSCOPY (VATS)/DECORTICATION Left 12/26/2016   Procedure: LEFT VIDEO ASSISTED THORACOSCOPY (VATS)/DECORTICATION;  Surgeon: Ivin Poot, MD;  Location: [facility];  Service: Thoracic;  Laterality: Left;  Marland Kitchen VIDEO BRONCHOSCOPY N/A 12/26/2016   Procedure: VIDEO BRONCHOSCOPY WITH BRONCHIAL LAVAGE;  Surgeon: Ivin Poot, MD;  Location: [facility] OR;  Service: Thoracic;  Laterality: N/A;   HPI:  Pt with recent admission for PRES. Pt presented with tingling of lt side and lt sided headache. MRI showed improving PRES and several lt punctate infarcts. Neurologist did not feel infarcts were the cause of symptoms. Pt left SNF AMA and didn't have prescription for BP meds. PMH - bipolar, copd, htn, vats. Pt known to ST from prior  admissions when seen for acute reversible dysphagia following prolonged intubation, d/c'd on regular diet with thin liquids. History of confusion at baseline.    Assessment / Plan / Recommendation Clinical Impression  Patient presents with cognitive communication status at baseline level of functioning. Administered MMSE; patient scored 26/30 which is within normal limits. Assessment limited by patient fatigue, verbal agitation, low frustration tolerance. These observations  consistent with patient affect during prior admissions. Patient declined to participate in 2 testing items as she did not wish to open her eyes or sit up for the examination, limited by fatigue and headache. Delayed recall 1/3, consistent with prior memory deficits. Patient plans to live with her boyfriend upon discharge. When questioned about her goals preferred activities, or interest in home therapy to address pre-existing cognitive communication deficits, patient declines. No SLP follow-up recommended at this time, however patient may benefit from RN or aide to assist with managing medications at home.    SLP Assessment  SLP Recommendation/Assessment: Patient does not need any further Speech Lanaguage Pathology Services SLP Visit Diagnosis: Cognitive communication deficit (R41.841)    Follow Up Recommendations  None;Other (comment) (RN to assist with medication management)    Frequency and Duration           SLP Evaluation Cognition  Overall Cognitive Status: History of cognitive impairments - at baseline Arousal/Alertness: Awake/alert Orientation Level: Oriented to person;Oriented to place;Oriented to situation Attention: Focused;Sustained Focused Attention: Appears intact Sustained Attention: Appears intact Memory: Impaired Memory Impairment: Decreased recall of new information;Other (comment) ("I can't remember anything after March 1") Awareness: Impaired Awareness Impairment: Emergent impairment Problem Solving: Impaired Problem Solving Impairment: Verbal basic;Functional basic Executive Function: Self Monitoring;Initiating;Decision Making;Reasoning;Sequencing Reasoning: Impaired Reasoning Impairment: Verbal basic;Functional basic Sequencing: Impaired Sequencing Impairment: Verbal basic;Functional basic Decision Making: Impaired Decision Making Impairment: Verbal basic;Functional basic Initiating: Impaired Initiating Impairment: Verbal basic;Functional basic Self  Monitoring: Impaired Self Monitoring Impairment: Verbal basic;Functional basic Behaviors: Verbal agitation;Poor frustration tolerance Safety/Judgment: Impaired       Comprehension  Auditory Comprehension Overall Auditory Comprehension: Appears within functional limits for tasks assessed Yes/No Questions: Within Functional Limits Commands: Within Functional Limits Conversation: Simple Interfering Components: Pain Visual Recognition/Discrimination Discrimination: Not tested Reading Comprehension Reading Status: Not tested    Expression Expression Primary Mode of Expression: Verbal Verbal Expression Overall Verbal Expression: Appears within functional limits for tasks assessed Initiation: No impairment Automatic Speech: Name;Social Response Level of Generative/Spontaneous Verbalization: Sentence Repetition: No impairment Naming: No impairment Pragmatics: Impairment Impairments: Abnormal affect;Dysprosody;Eye contact;Monotone Effective Techniques: Open ended questions Non-Verbal Means of Communication: Not applicable Written Expression Dominant Hand: Right Written Expression: Unable to assess (comment) (Patient declined)   Oral / Motor  Oral Motor/Sensory Function Overall Oral Motor/Sensory Function: Other (comment) (unable to assess; patient declined) Motor Speech Overall Motor Speech: Appears within functional limits for tasks assessed Respiration: Within functional limits Phonation: Normal Articulation: Within functional limitis Intelligibility: Intelligible Motor Planning: Witnin functional limits Motor Speech Errors: Not applicable   [place], MS CF-SLP Speech-Language Pathologist [place] 02/08/2017, 4:56 PM

## 2017-02-08 NOTE — Progress Notes (Addendum)
STROKE TEAM PROGRESS NOTE   HISTORY OF PRESENT ILLNESS (per record) [name] is a 51 y.o. 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 She was discharged on March 1, and has been in the nursing home since that time. She left the nursing home yesterday, stating that she was not getting good care there, and since it was an [place] discharge they would not give her a list of her medications or give her prescriptions. She therefore did not have her blood pressure medicines today. She states that she does have tingling which started in her left arm and then spread throughout her left side.  She began noticing left-sided tingling and pain as well as severe left-sided headache with photophobia and nausea  Sometime today. She is unable to give an exact timing.  Apparently, there has been some confusion ever since she left the hospital.  LKW: Unclear tpa given?: no, unclear time of onset   SUBJECTIVE (INTERVAL HISTORY) Her family was not at the bedside.  She complains less of severe HA and states that magnesium helped.  She still requests narcotics but now for back pain.  She also requested that Internal Medicine team review her medications with her.  She states that she is amnestic to a large part of December and doesn't remember the care plan established at the time.  She states that a number of her medications "that she needs" were discontinued including narcotics and benzos.  I explained that she may have been considered overmedicated at the time and that is possibly why medications were discontinued.  Apparently her daughter has told her the same thing  OBJECTIVE Temp:  [98.1 F (36.7 C)-98.4 F (36.9 C)] 98.1 F (36.7 C) (03/18 1052) Pulse Rate:  [69-78] 70 (03/18 1052) Cardiac Rhythm: Normal sinus rhythm (03/18 0700) Resp:  [18] 18 (03/18 1052) BP: (110-173)/(47-92) 110/47 (03/18 1052) SpO2:  [93 %-97 %] 95 % (03/18 1052)  CBC:   Recent  Labs Lab 02/06/17 1547 02/06/17 1841 02/08/17 0400  WBC 5.1 5.1 4.8  NEUTROABS 3.2 2.9  --   HGB 10.7* 10.5* 9.4*  HCT 34.2* 33.3* 30.4*  MCV 91.7 91.2 92.7  PLT 284 261 831    Basic Metabolic Panel:   Recent Labs Lab 02/06/17 1547 02/08/17 0400  NA 140 139  K 4.6 4.0  CL 106 107  CO2 24 27  GLUCOSE 96 96  BUN 14 12  CREATININE 0.80 0.76  CALCIUM 9.5 9.2  MG  --  1.7    Lipid Panel:     Component Value Date/Time   CHOL 244 (H) 02/07/2017 0110   TRIG 151 (H) 02/07/2017 0110   HDL 59 02/07/2017 0110   CHOLHDL 4.1 02/07/2017 0110   VLDL 30 02/07/2017 0110   LDLCALC 155 (H) 02/07/2017 0110   HgbA1c:  Lab Results  Component Value Date   HGBA1C 5.4 01/16/2017   Urine Drug Screen:     Component Value Date/Time   LABOPIA NONE DETECTED 02/07/2017 0838   COCAINSCRNUR NONE DETECTED 02/07/2017 0838   LABBENZ NONE DETECTED 02/07/2017 0838   AMPHETMU NONE DETECTED 02/07/2017 0838   THCU NONE DETECTED 02/07/2017 0838   LABBARB NONE DETECTED 02/07/2017 0838      IMAGING  Dg Chest 2 View 02/06/2017 1. Small LEFT effusion.  2. Bilateral healed rib fractures.  3. No pulmonary edema, pneumothorax or infiltrate    Ct Head Wo Contrast 02/06/2017 1. No evidence of acute intracranial abnormality.  2.  Interval resolution/near complete resolution of bilateral cerebral hypodensities compatible with resolved PRES.  3. Chronic small vessel ischemic disease and chronic left frontal lobe infarct.    Mr Brain Wo Contrast 02/06/2017 1. Four punctate foci of low diffusion in left posterior frontal and parietal lobes compatible with acute/early subacute ischemia new from prior MRI.  2. Small left occipital focus probable subacute infarction.  3. Marked interval diminution in prior findings of PRES with mild residual cortical FLAIR signal abnormality in left posterior frontal, left parietal, and bilateral occipital lobes.  4. Background of moderate chronic microvascular  ischemic changes and mild parenchymal volume loss of the brain.    MR MRA Head / Brain 01/16/2017 1. 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 2. Motion degraded MRA with multiple questioned anterior circulation stenoses versus motion artifact as above. Consider further evaluation with CTA as an outpatient once the patient's acute illness has resolved and he is better able to remain motionless. 3. Hypoplastic versus occluded distal left vertebral artery. Widely patent and dominant right vertebral artery. 4. 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  CTA Head 01/24/2017 1. Normal intracranial CTA. 2. Confluent hypoattenuation throughout the periventricular white matter, predominantly within the occipital lobes and left frontal lobe. This is compatible with the diagnosis of PRES, as demonstrated on recent MRI. This may represent the sequela of that episode. 3. No specific finding to explain the reported left eye pain.   Carotid duplex  12/13/2016 No significant extracranial carotid artery stenosis demonstrated,1-39% category.  Vertebral are patent with antegrade flow.   PHYSICAL EXAM Physical Exam General - Well nourished, well developed, in NAD   Cardiovascular - Regular rate and rhythm Pulmonary: CTA Abdomen: NT, ND, normal bowel sounds Extremities: No C/C/E  Neurological Exam Mental Status: Normal Orientation:  Oriented to person, place and time Speech:  Fluent; no dysarthria  Cranial Nerves:  PERRL; EOMI; visual fields full, face grossly symmetric, hearing grossly intact; shrug symmetric and tongue midline  Motor Exam:  Tone:  Within normal limits; Strength: 5/5 throughout; mildly tremulous  Sensory: Intact to light touch on the  left.  She reports slight decrease in sensation on the right  Coordination:  Intact finger to nose  Gait: Deferred   ASSESSMENT/PLAN [name] is a 51 y.o. female with history of PRES, hypertension, COPD, bipolar disorder, and migraine headaches  presenting with left arm tingling, confusion, severe left-sided headache, photophobia, and nausea.  She did not receive IV t-PA due to unclear time of onset.  Strokes:  Multiple strokes on the left likely embolic from an unknown source.  Resultant  Decreased sensation on the right  MRI -  Four punctate foci of low diffusion in left posterior frontal and parietal lobes compatible with acute/early subacute ischemia new from prior MRI. Small left occipital focus probable subacute infarction.   MRA - 01/16/2017 -  Motion degraded  CTA Head - 01/24/2017 - compatible with the diagnosis of PRES o/w normal  UDS - positive for opioids in the past.  Negative this admission  Carotid Doppler - 12/14/2016 -  Bilateral carotids 1-39% category. Vertebral are patent with antegrade flow.  2D Echo - 12/13/2016 - EF - 65% to 70%. No cardiac source of emboli identified.  LDL - 155  HgbA1c - 5.4 (01/16/2017)  VTE prophylaxis - SCDs Diet  Heart Room service appropriate? Yes; Fluid consistency: Thin Diet NPO time specified Except for: Sips with Meds  No antithrombotic prior to admission, now on aspirin 325 mg daily  Patient counseled to be compliant with her antithrombotic medications  Ongoing aggressive stroke risk factor management  Therapy recommendations: No f/u therapies recommended  Disposition: Pending  Hypertension  Blood pressure tends to run low  Permissive hypertension (OK if < 220/120) but gradually normalize in 5-7 days  Long-term BP goal normotensive  Hyperlipidemia  Home meds:  Zocor 40 mg daily resumed in hospital  LDL 155, goal < 70  Confirm compliance. Consider change to Lipitor 80 mg daily.  Continue statin at  discharge  Other Stroke Risk Factors  Cigarette smoker - advised to stop smoking  Obesity, Body mass index is 28.62 kg/m., recommend weight loss, diet and exercise as appropriate   Family hx stroke (Mother)  Migraines  Other Active Problems  Anemia - 10.5 / 33.3 -> 9.4 / 30.4  On Keppra 1 gm Bid   Hospital day # 1  ATTENDING NOTE: Patient was seen and examined by me personally. Documentation reflects findings. The laboratory and radiographic studies reviewed by me. ROS completed by me personally and pertinent positives fully documented  Condition: stable  Assessment and plan completed by me personally and fully documented above. Plans/Recommendations include:     Magnesium for HA works well  Zofran for nausea continued  Recent admission for PRESS included comprehensive assessment and testing that we ordinarily do for stroke.  Given embolic appearance of strokes.  TEE / Loop Monday - Message sent to Cardiology. NPO order written  Given age and multiple strokes, if source not found, may need hypercoagulable states and autoimmune evaluation and follow up with Stroke outpatient team   To contact Stroke Continuity provider, please refer to [email or web address] After hours, contact General Neurology

## 2017-02-09 DIAGNOSIS — F319 Bipolar disorder, unspecified: Secondary | ICD-10-CM

## 2017-02-09 DIAGNOSIS — R531 Weakness: Secondary | ICD-10-CM

## 2017-02-09 LAB — GLUCOSE, CAPILLARY: GLUCOSE-CAPILLARY: 94 mg/dL (ref 65–99)

## 2017-02-09 LAB — TROPONIN I: Troponin I: <0.03 ng/mL (ref <0.03)

## 2017-02-09 MED ORDER — ZOLPIDEM TARTRATE 5 MG PO TABS
5.0000 mg | ORAL_TABLET | Freq: Once | ORAL | Status: AC
  Administered 2017-02-10: 5 mg via ORAL
  Filled 2017-02-09: qty 1

## 2017-02-09 MED ORDER — AMLODIPINE BESYLATE 5 MG PO TABS
5.0000 mg | ORAL_TABLET | Freq: Every day | ORAL | Status: DC
  Administered 2017-02-09 – 2017-02-10 (×2): 5 mg via ORAL
  Filled 2017-02-09 (×2): qty 1

## 2017-02-09 MED ORDER — GABAPENTIN 400 MG PO CAPS
400.0000 mg | ORAL_CAPSULE | Freq: Three times a day (TID) | ORAL | Status: DC
  Administered 2017-02-09 – 2017-02-10 (×4): 400 mg via ORAL
  Filled 2017-02-09 (×4): qty 1

## 2017-02-09 MED ORDER — LORAZEPAM 0.5 MG PO TABS
0.5000 mg | ORAL_TABLET | Freq: Once | ORAL | Status: AC | PRN
  Administered 2017-02-09: 0.5 mg via ORAL
  Filled 2017-02-09: qty 1

## 2017-02-09 MED ORDER — HYDROXYZINE HCL 25 MG PO TABS
50.0000 mg | ORAL_TABLET | Freq: Once | ORAL | Status: AC | PRN
  Administered 2017-02-09: 50 mg via ORAL
  Filled 2017-02-09: qty 2

## 2017-02-09 MED ORDER — DIPHENHYDRAMINE HCL 25 MG PO CAPS
25.0000 mg | ORAL_CAPSULE | Freq: Once | ORAL | Status: AC
  Administered 2017-02-09: 25 mg via ORAL
  Filled 2017-02-09: qty 1

## 2017-02-09 MED ORDER — LAMOTRIGINE 100 MG PO TABS
100.0000 mg | ORAL_TABLET | Freq: Two times a day (BID) | ORAL | Status: DC
  Administered 2017-02-09 – 2017-02-10 (×2): 100 mg via ORAL
  Filled 2017-02-09 (×2): qty 1

## 2017-02-09 MED ORDER — ESCITALOPRAM OXALATE 10 MG PO TABS
40.0000 mg | ORAL_TABLET | Freq: Every day | ORAL | Status: DC
  Administered 2017-02-09 – 2017-02-10 (×2): 40 mg via ORAL
  Filled 2017-02-09 (×2): qty 4

## 2017-02-09 NOTE — Progress Notes (Signed)
PROGRESS NOTE    [name]  VOH:[id number] DOB: 1966/01/10 DOA: 02/06/2017 PCP: [name], MD    Brief Narrative:   51 y.o. female with medical history significant of Seizures, HTN, CKD, PRES, Bipolar,  Chronic pain syndrome, hx of Empyema, COPD   Diagnosed with new stroke.  Assessment & Plan:     CVA (cerebral vascular accident) ([place]) - stroke work up underway. - neurology consulted. - continue aspirin and statin - Awaiting repeat echocardiogram  Active Problems:   Essential hypertension - Pt on hydralazine, lopressor    Asthma - no wheezes, continue pulmicort    Bipolar I disorder (HCC)    Chest pain - troponin normal.  Anxiety: - Will add atarax for anxiety   DVT prophylaxis: scd's Code Status: Full Family Communication: None at bedside Disposition Plan: once cleared for d/c by neurology, awaiting repeat echocardiogram.   Consultants:   neurology   Procedures: stroke work up underway   Antimicrobials: None   Subjective: Pt still c/o headache improved with pain medication regimen.  Objective: Vitals:   02/09/17 0713 02/09/17 0740 02/09/17 1000 02/09/17 1411  BP: 139/70 135/71 134/73 124/65  Pulse: 68 69 65 63  Resp: 16 15 18 18   Temp: 97.9 F (36.6 C)  98.2 F (36.8 C) 97.7 F (36.5 C)  TempSrc: Oral  Oral Oral  SpO2: 95% 94%  97%  Weight:      Height:       No intake or output data in the 24 hours ending 02/09/17 1457 Filed Weights   02/06/17 1524 02/07/17 0030  Weight: 73 kg (161 lb) 71 kg (156 lb 8 oz)    Examination:  General exam: Appears calm and comfortable, in nad. Respiratory system: Clear to auscultation. Respiratory effort normal. Cardiovascular system: S1 & S2 heard, RRR.  Gastrointestinal system: Abdomen is nondistended, soft and nontender. No organomegaly or masses felt. Normal bowel sounds heard.  Central nervous system: Alert and awake. No facial asymmetry Extremities: warm, no cyanosis Skin: No rashes,  lesions or ulcers, on limited exam. Psychiatry: Mood & affect appropriate.   Data Reviewed: I have personally reviewed following labs and imaging studies  CBC:  Recent Labs Lab 02/06/17 1547 02/06/17 1841 02/08/17 0400  WBC 5.1 5.1 4.8  NEUTROABS 3.2 2.9  --   HGB 10.7* 10.5* 9.4*  HCT 34.2* 33.3* 30.4*  MCV 91.7 91.2 92.7  PLT 284 261 694   Basic Metabolic Panel:  Recent Labs Lab 02/06/17 1547 02/08/17 0400  NA 140 139  K 4.6 4.0  CL 106 107  CO2 24 27  GLUCOSE 96 96  BUN 14 12  CREATININE 0.80 0.76  CALCIUM 9.5 9.2  MG  --  1.7   GFR: Estimated Creatinine Clearance: 76 mL/min (by C-G formula based on SCr of 0.76 mg/dL). Liver Function Tests: No results for input(s): AST, ALT, ALKPHOS, BILITOT, PROT, ALBUMIN in the last 168 hours. No results for input(s): LIPASE, AMYLASE in the last 168 hours. No results for input(s): AMMONIA in the last 168 hours. Coagulation Profile:  Recent Labs Lab 02/06/17 1730  INR 1.00   Cardiac Enzymes:  Recent Labs Lab 02/07/17 0110 02/07/17 1006 02/07/17 1322 02/09/17 0834  TROPONINI <0.03 <0.03 <0.03 <0.03   BNP (last 3 results) No results for input(s): PROBNP in the last 8760 hours. HbA1C: No results for input(s): HGBA1C in the last 72 hours. CBG: No results for input(s): GLUCAP in the last 168 hours. Lipid Profile:  Recent Labs  02/07/17  0110  CHOL 244*  HDL 59  LDLCALC 155*  TRIG 151*  CHOLHDL 4.1   Thyroid Function Tests: No results for input(s): TSH, T4TOTAL, FREET4, T3FREE, THYROIDAB in the last 72 hours. Anemia Panel: No results for input(s): VITAMINB12, FOLATE, FERRITIN, TIBC, IRON, RETICCTPCT in the last 72 hours. Sepsis Labs: No results for input(s): PROCALCITON, LATICACIDVEN in the last 168 hours.  No results found for this or any previous visit (from the past 240 hour(s)).   Radiology Studies: No results found. Scheduled Meds: . amLODipine  5 mg Oral Daily  . aspirin  300 mg Rectal Daily     Or  . aspirin  325 mg Oral Daily  . famotidine  20 mg Oral BID  . hydrALAZINE  100 mg Oral Q6H  . levETIRAcetam  1,000 mg Oral BID  . magnesium oxide  400 mg Oral BID  . metoprolol  100 mg Oral BID  . pantoprazole  40 mg Oral Q1200  . QUEtiapine  100 mg Oral QHS  . simvastatin  40 mg Oral q1800  . valACYclovir  500 mg Oral Daily  . zolpidem  5 mg Oral Once   Continuous Infusions: . sodium chloride 1,000 mL (02/07/17 0104)     LOS: 2 days   Time spent: > 35 minutes  Velvet Bathe, MD [facility] Pager [phone]  If 7PM-7AM, please contact night-coverage [email or web address] Password [email or web address] 02/09/2017, 2:57 PM

## 2017-02-09 NOTE — Care Management Note (Signed)
Case Management Note  Patient Details  Name: [name] MRN: [id number] Date of Birth: 07-23-1966  Subjective/Objective:        Patient presented to the ED from [facility] with an acute CVA.  CM will follow for discharge needs pending patient's progress and physician orders.             Action/Plan:   Expected Discharge Date:                  Expected Discharge Plan:     In-House Referral:     Discharge planning Services     Post Acute Care Choice:    Choice offered to:     DME Arranged:    DME Agency:     HH Arranged:    HH Agency:     Status of Service:     If discussed at [employer] of Stay Meetings, dates discussed:    Additional Comments:  Rolm Baptise, RN 02/09/2017, 10:17 AM

## 2017-02-09 NOTE — Progress Notes (Signed)
STROKE TEAM PROGRESS NOTE   HISTORY OF PRESENT ILLNESS (per record) [name] is a 51 y.o. 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 She was discharged on March 1, and has been in the nursing home since that time. She left the nursing home yesterday, stating that she was not getting good care there, and since it was an [place] discharge they would not give her a list of her medications or give her prescriptions. She therefore did not have her blood pressure medicines today. She states that she does have tingling which started in her left arm and then spread throughout her left side.  She began noticing left-sided tingling and pain as well as severe left-sided headache with photophobia and nausea  Sometime today. She is unable to give an exact timing.  Apparently, there has been some confusion ever since she left the hospital.  LKW: Unclear tpa given?: no, unclear time of onset   SUBJECTIVE (INTERVAL HISTORY) Her family was not at the bedside.  She complains less of severe HA  and  requests narcotics  And complains that the dose of narcotics she is getting is less than her home dose OBJECTIVE Temp:  [97.7 F (36.5 C)-98.2 F (36.8 C)] 98.1 F (36.7 C) (03/19 1744) Pulse Rate:  [60-70] 60 (03/19 1744) Cardiac Rhythm: Normal sinus rhythm (03/19 0700) Resp:  [15-20] 18 (03/19 1744) BP: (123-161)/(65-82) 159/82 (03/19 1744) SpO2:  [94 %-98 %] 98 % (03/19 1744)  CBC:   Recent Labs Lab 02/06/17 1547 02/06/17 1841 02/08/17 0400  WBC 5.1 5.1 4.8  NEUTROABS 3.2 2.9  --   HGB 10.7* 10.5* 9.4*  HCT 34.2* 33.3* 30.4*  MCV 91.7 91.2 92.7  PLT 284 261 546    Basic Metabolic Panel:   Recent Labs Lab 02/06/17 1547 02/08/17 0400  NA 140 139  K 4.6 4.0  CL 106 107  CO2 24 27  GLUCOSE 96 96  BUN 14 12  CREATININE 0.80 0.76  CALCIUM 9.5 9.2  MG  --  1.7    Lipid Panel:     Component Value Date/Time   CHOL 244 (H) 02/07/2017  0110   TRIG 151 (H) 02/07/2017 0110   HDL 59 02/07/2017 0110   CHOLHDL 4.1 02/07/2017 0110   VLDL 30 02/07/2017 0110   LDLCALC 155 (H) 02/07/2017 0110   HgbA1c:  Lab Results  Component Value Date   HGBA1C 5.4 01/16/2017   Urine Drug Screen:     Component Value Date/Time   LABOPIA NONE DETECTED 02/07/2017 0838   COCAINSCRNUR NONE DETECTED 02/07/2017 0838   LABBENZ NONE DETECTED 02/07/2017 0838   AMPHETMU NONE DETECTED 02/07/2017 0838   THCU NONE DETECTED 02/07/2017 0838   LABBARB NONE DETECTED 02/07/2017 0838      IMAGING  Dg Chest 2 View 02/06/2017 1. Small LEFT effusion.  2. Bilateral healed rib fractures.  3. No pulmonary edema, pneumothorax or infiltrate    Ct Head Wo Contrast 02/06/2017 1. No evidence of acute intracranial abnormality.  2. Interval resolution/near complete resolution of bilateral cerebral hypodensities compatible with resolved PRES.  3. Chronic small vessel ischemic disease and chronic left frontal lobe infarct.    Mr Brain Wo Contrast 02/06/2017  . Four punctate foci of low diffusion in left posterior frontal and parietal lobes   new from prior MRI. And new Small left occipital focus   Marked interval diminution in prior findings of PRES with mild residual cortical FLAIR signal abnormality in left  posterior frontal, left parietal, and bilateral occipital lobes.   Background of moderate chronic microvascular ischemic changes and mild parenchymal volume loss of the brain.    MR MRA Head / Brain 01/16/2017 1. 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 2. Motion degraded MRA with multiple questioned anterior circulation stenoses versus motion artifact as above. Consider further evaluation with CTA as an outpatient once the patient's acute illness has  resolved and he is better able to remain motionless. 3. Hypoplastic versus occluded distal left vertebral artery. Widely patent and dominant right vertebral artery. 4. 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  CTA Head 01/24/2017 1. Normal intracranial CTA. 2. Confluent hypoattenuation throughout the periventricular white matter, predominantly within the occipital lobes and left frontal lobe. This is compatible with the diagnosis of PRES, as demonstrated on recent MRI. This may represent the sequela of that episode. 3. No specific finding to explain the reported left eye pain.   Carotid duplex  12/13/2016 No significant extracranial carotid artery stenosis demonstrated,1-39% category.  Vertebral are patent with antegrade flow.   PHYSICAL EXAM Physical Exam General - Well nourished, well developed middle aged caucasian lady, in NAD   Cardiovascular - Regular rate and rhythm Pulmonary: CTA Abdomen: NT, ND, normal bowel sounds Extremities: No C/C/E  Neurological Exam Mental Status: Normal Orientation:  Oriented to person, place and time Speech:  Fluent; no dysarthria  Cranial Nerves:  PERRL; EOMI; visual fields full, face grossly symmetric, hearing grossly intact; shrug symmetric and tongue midline  Motor Exam:  Tone:  Within normal limits; Strength: 5/5 throughout; mildly tremulous  Sensory: Intact to light touch on the left.  She reports slight decrease in sensation on the right  Coordination:  Intact finger to nose  Gait: Deferred   ASSESSMENT/PLAN [name] is a 51 y.o. female with history of PRES, hypertension, COPD, bipolar disorder, and migraine headaches  presenting with left arm tingling, confusion, severe left-sided headache, photophobia, and nausea.  She did not receive IV t-PA due to unclear time of onset.   Strokes:  Multiple diffusion positive tiny lesions ?  Doubt Infarcts  likely new PRES lesion   And T 2 shine thru  Resultant  Decreased sensation on the right  MRI -  Four punctate foci of low diffusion in left posterior frontal and parietal lobes compatible with acute/early subacute ischemia new from prior MRI. Small left occipital focus probable subacute infarction.   MRA - 01/16/2017 -  Motion degraded  CTA Head - 01/24/2017 - compatible with the diagnosis of PRES o/w normal  UDS - positive for opioids in the past.  Negative this admission  Carotid Doppler - 12/14/2016 -  Bilateral carotids 1-39% category. Vertebral are patent with antegrade flow.  2D Echo - 12/13/2016 - EF - 65% to 70%. No cardiac source of emboli identified.  LDL - 155  HgbA1c - 5.4 (01/16/2017)  VTE prophylaxis - SCDs Diet Heart Room service appropriate? Yes; Fluid consistency: Thin Diet NPO time specified  No antithrombotic prior to admission, now on aspirin 325 mg daily  Patient counseled to be compliant with her antithrombotic medications  Ongoing aggressive stroke risk factor management  Therapy recommendations: No f/u therapies recommended  Disposition: Pending  Hypertension  Blood pressure tends to run low  Permissive hypertension (OK if < 220/120) but gradually normalize  in 5-7 days  Long-term BP goal normotensive  Hyperlipidemia  Home meds:  Zocor 40 mg daily resumed in hospital  LDL 155, goal < 70  Confirm compliance. Consider change to Lipitor 80 mg daily.  Continue statin at discharge  Other Stroke Risk Factors  Cigarette smoker - advised to stop smoking  Obesity, Body mass index is 28.62 kg/m., recommend weight loss, diet and exercise as appropriate   Family hx stroke (Mother)  Migraines  Other Active Problems  Anemia - 10.5 / 33.3 -> 9.4 / 30.4  On Keppra 1 gm Bid   Hospital day # 2  I have personally examined this patient, reviewed notes, independently viewed imaging studies, participated in medical decision making and plan of care.ROS completed by me  personally and pertinent positives fully documented  I have made any additions or clarifications directly to the above note. 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 I doubt these represent ischemic  She also has some drug seeking behavior for narcotics. I recommend straight blood pressure control with systolic pressure goes below 1 Continue present management. I do not believe TEE or hypercoagalable panel is necessary..d/w Dr Vega.greater than 50% time during this 25 minute visit was spent on counseling and coordination of care about her presentation and MRI findings and answering questions.Stroke team will sign off. Call for questions.  Antony Contras, MD Medical Director [facility] Pager: [phone]  02/09/2017 8:13 PM     To contact Stroke Continuity provider, please refer to [email or web address] After hours, contact General Neurology

## 2017-02-09 NOTE — Progress Notes (Signed)
Patient states she feels she is dying, states she has 10/10 chest pain, her headache is 10/10 and she is having an anxiety attack. MD notified. Restarted gabapentin 800 mg TID, lexapro 40mg , lamictal 100mg . Administered zofran IV.

## 2017-02-09 NOTE — Progress Notes (Signed)
Patient c/o "6/10" chest pain, states the pain is in her heart and does not radiate. She states the chest pain has been present for at least 2 weeks, denies any SOB, BP 135/71 in right arm lying, pulse 69, respirations 15, SaO2 is 94% on room air. Neuro assessment unchanged. Dr. Hillary Bow notified. ECG obtained. Order for troponin placed. Patient is now watching tv. Call bell at bedside.

## 2017-02-09 NOTE — Progress Notes (Signed)
    [facility] has been requested to perform a transesophageal echocardiogram on [name] for cerebrovascular accident.  After careful review of history and examination, the risks and benefits of transesophageal echocardiogram have been explained including risks of esophageal damage, perforation (1:10,000 risk), bleeding, pharyngeal hematoma as well as other potential complications associated with conscious sedation including aspiration, arrhythmia, respiratory failure and death. Alternatives to treatment were discussed, questions were answered. Patient is willing to proceed.   BP stable with no requirement for pressor support. Hgb 9.4, platelets 236.  Scheduled for 02/10/2017 at 0900 with Dr. Radford Pax.   Erma Heritage, [place]  02/09/2017 1:12 PM

## 2017-02-09 NOTE — Progress Notes (Signed)
OT Cancellation Note  Patient Details Name: [name] MRN: [id number] DOB: 1966-11-07   Cancelled Treatment:    Reason Eval/Treat Not Completed: Other (comment).  Attempted skilled OT. With pt.  She would not open her eyes to engage while speaking to me.  Continued to refuse with multiple attempts and suggestions for treatment options.  States "my body hurts all over".  Will attempt back as able.    Janice Coffin, COTA/L 02/09/2017, 9:27 AM

## 2017-02-09 NOTE — Progress Notes (Signed)
Advised pt of NPO after MN with verbalized understanding pending for TEE. Pt refused and drank about 241ml of soda overnight.

## 2017-02-09 NOTE — Progress Notes (Signed)
Endoscopy notified RN patient's tee scheduled for tomorrow morning. Patient placed back on heart healthy diet. Notified patient.

## 2017-02-10 ENCOUNTER — Encounter (HOSPITAL_COMMUNITY)
Admission: EM | Disposition: A | Discharge status: Home Health | Payer: Self-pay | Source: Home / Self Care | Attending: Family Medicine

## 2017-02-10 ENCOUNTER — Inpatient Hospital Stay (HOSPITAL_COMMUNITY): Payer: Medicaid Other

## 2017-02-10 ENCOUNTER — Encounter (HOSPITAL_COMMUNITY): Payer: Self-pay | Admitting: *Deleted

## 2017-02-10 DIAGNOSIS — R079 Chest pain, unspecified: Secondary | ICD-10-CM

## 2017-02-10 SURGERY — CANCELLED PROCEDURE

## 2017-02-10 MED ORDER — AMLODIPINE BESYLATE 5 MG PO TABS: 5.0000 mg | ORAL_TABLET | Freq: Every day | ORAL | 0 refills | Status: DC

## 2017-02-10 MED ORDER — ACETAMINOPHEN 325 MG PO TABS
325.0000 mg | ORAL_TABLET | Freq: Once | ORAL | Status: AC
  Administered 2017-02-10: 325 mg via ORAL
  Filled 2017-02-10: qty 1

## 2017-02-10 MED ORDER — PANTOPRAZOLE SODIUM 40 MG PO TBEC: 40.0000 mg | DELAYED_RELEASE_TABLET | Freq: Every day | ORAL | 0 refills | Status: DC

## 2017-02-10 MED ORDER — ESCITALOPRAM OXALATE 20 MG PO TABS: 40.0000 mg | ORAL_TABLET | Freq: Every day | ORAL | 0 refills | Status: AC

## 2017-02-10 MED ORDER — LAMOTRIGINE 100 MG PO TABS: 100.0000 mg | ORAL_TABLET | Freq: Two times a day (BID) | ORAL | 0 refills | Status: DC

## 2017-02-10 MED ORDER — HYDRALAZINE HCL 100 MG PO TABS: 100.0000 mg | ORAL_TABLET | Freq: Four times a day (QID) | ORAL | 0 refills | Status: AC

## 2017-02-10 MED ORDER — LEVETIRACETAM 1000 MG PO TABS: 1000.0000 mg | ORAL_TABLET | Freq: Two times a day (BID) | ORAL | 0 refills | Status: AC

## 2017-02-10 MED ORDER — METOPROLOL TARTRATE 100 MG PO TABS: 100.0000 mg | ORAL_TABLET | Freq: Two times a day (BID) | ORAL | 0 refills | Status: DC

## 2017-02-10 MED ORDER — ATORVASTATIN CALCIUM 20 MG PO TABS: 20.0000 mg | ORAL_TABLET | Freq: Every day | ORAL | 0 refills | Status: DC

## 2017-02-10 MED ORDER — ESCITALOPRAM OXALATE 20 MG PO TABS: 40.0000 mg | ORAL_TABLET | Freq: Every day | ORAL | 0 refills | Status: DC

## 2017-02-10 MED ORDER — LAMOTRIGINE 100 MG PO TABS: 100.0000 mg | ORAL_TABLET | Freq: Two times a day (BID) | ORAL | 0 refills | Status: AC

## 2017-02-10 MED ORDER — ACETAMINOPHEN 325 MG PO TABS: 650.0000 mg | ORAL_TABLET | ORAL | Status: AC | PRN

## 2017-02-10 MED ORDER — MAGNESIUM OXIDE 400 (241.3 MG) MG PO TABS: 400.0000 mg | ORAL_TABLET | Freq: Two times a day (BID) | ORAL | 0 refills | Status: DC

## 2017-02-10 MED ORDER — GABAPENTIN 400 MG PO CAPS: 400.0000 mg | ORAL_CAPSULE | Freq: Three times a day (TID) | ORAL | 0 refills | Status: AC

## 2017-02-10 MED ORDER — ATORVASTATIN CALCIUM 10 MG PO TABS
20.0000 mg | ORAL_TABLET | Freq: Every day | ORAL | Status: DC
  Administered 2017-02-10: 20 mg via ORAL
  Filled 2017-02-10: qty 2

## 2017-02-10 MED ORDER — GABAPENTIN 400 MG PO CAPS: 400.0000 mg | ORAL_CAPSULE | Freq: Three times a day (TID) | ORAL | Status: DC

## 2017-02-10 NOTE — Interval H&P Note (Signed)
History and Physical Interval Note:  02/10/2017 8:37 AM  [name]  has presented today for surgery, with the diagnosis of stroke  The various methods of treatment have been discussed with the patient and family. After consideration of risks, benefits and other options for treatment, the patient has consented to  Procedure(s): TRANSESOPHAGEAL ECHOCARDIOGRAM (TEE) (N/A) as a surgical intervention .  The patient's history has been reviewed, patient examined, no change in status, stable for surgery.  I have reviewed the patient's chart and labs.  Questions were answered to the patient's satisfaction.     [name]

## 2017-02-10 NOTE — Progress Notes (Signed)
Occupational Therapy Treatment Patient Details Name: [name] MRN: [id number] DOB: 1965/11/26 Today's Date: 02/10/2017    History of present illness Pt with recent admission for PRES. Pt presented with tingling of lt side and lt sided headache. MRI showed improving PRES and several lt punctate infarcts. Neurologist did not feel infarcts were the cause of symptoms. Pt left SNF AMA and didn't have prescription for BP meds. PMH - bipolar, copd, htn, vats   OT comments  Pt demonstrating progress toward OT goals. This session limited by reports of severe pain. Pt agreeable only to education and L UE functional activities to improve coordination and strength overall. Educated pt concerning compensatory strategies for L UE decreased hot/cold discrimination as well as decreased light touch with handout provided. Pt verbalizes understanding. She does report that her boyfriend will be doing the majority of the home management tasks that may impact her safety. Noted plan to D/C home this afternoon. All OT education complete and no further acute OT needs identified. Will sign off.    Follow Up Recommendations  No OT follow up    Equipment Recommendations  None recommended by OT    Recommendations for Other Services      Precautions / Restrictions Precautions Precautions: None Restrictions Weight Bearing Restrictions: No       Mobility Bed Mobility               General bed mobility comments: Pt able to adjust positioning in bed and move to long sitting position. She refused sitting at EOB.  Transfers                 General transfer comment: Pt agreeable only to bed level exercises and education concerning decreased L UE sensation deficits and their impact on ADL.    Balance                                   ADL                                         General ADL Comments: Educated pt concerning safety measures to protect L UE with  decreased temperature sensation and light touch sensation with handout provided. She verbalized understanding. Pt refusing OOB for ADL this session but does report that she has been walking to the bathroom independently this session. Signed off as independent by nursing.      Vision                 Additional Comments: Closing eyes during session but able to open easily. Demonstrated ability to read handout well.   Perception     Praxis      Cognition   Behavior During Therapy: WFL for tasks assessed/performed Overall Cognitive Status: History of cognitive impairments - at baseline Area of Impairment: Memory;Safety/judgement;Awareness;Problem solving     Memory: Decreased short-term memory    Safety/Judgement: Decreased awareness of safety Awareness: Intellectual Problem Solving: Slow processing General Comments: Pt closing eyes throughout session.      Exercises Other Exercises Other Exercises: Facilitated improved L UE strength and gross motor coordination with functional reaching tasks.   Shoulder Instructions       General Comments      Pertinent Vitals/ Pain       Pain Assessment: 0-10 Pain Score: 10-Worst  pain ever (Pt resting calmly on OT arrival reporting 10/10 pain) Pain Location: head and chest Pain Descriptors / Indicators: Discomfort Pain Intervention(s): Monitored during session (RN aware and paging MD)  Home Living                                          Prior Functioning/Environment              Frequency  Min 2X/week        Progress Toward Goals  OT Goals(current goals can now be found in the care plan section)  Progress towards OT goals: Progressing toward goals  Acute Rehab OT Goals Patient Stated Goal: to have less pain OT Goal Formulation: With patient Time For Goal Achievement: 02/21/17 Potential to Achieve Goals: Good ADL Goals Pt Will Transfer to Toilet: Independently;ambulating Pt Will Perform  Toileting - Clothing Manipulation and hygiene: Independently;sit to/from stand Additional ADL Goal #1: Pt will identify 3 methods to compensate for decreased temperature discrimination in order to improve safety while completing cooking tasks.  Plan Discharge plan remains appropriate    Co-evaluation                 End of Session    OT Visit Diagnosis: Unsteadiness on feet (R26.81)   Activity Tolerance Patient limited by pain   Patient Left in bed;with call bell/phone within reach   Nurse Communication Mobility status;Other (comment) (Reports of severe pain)        Time: 4142-3953 OT Time Calculation (min): 9 min  Charges: OT General Charges $OT Visit: 1 Procedure OT Treatments $Therapeutic Activity: 8-22 mins  Norman Herrlich, MS OTR/L  Pager: [place] Jordani Nunn 02/10/2017, 3:48 PM

## 2017-02-10 NOTE — H&P (View-Only) (Signed)
STROKE TEAM PROGRESS NOTE   HISTORY OF PRESENT ILLNESS (per record) [name] is a 51 y.o. 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 She was discharged on March 1, and has been in the nursing home since that time. She left the nursing home yesterday, stating that she was not getting good care there, and since it was an [place] discharge they would not give her a list of her medications or give her prescriptions. She therefore did not have her blood pressure medicines today. She states that she does have tingling which started in her left arm and then spread throughout her left side.  She began noticing left-sided tingling and pain as well as severe left-sided headache with photophobia and nausea  Sometime today. She is unable to give an exact timing.  Apparently, there has been some confusion ever since she left the hospital.  LKW: Unclear tpa given?: no, unclear time of onset   SUBJECTIVE (INTERVAL HISTORY) Her family was not at the bedside.  She complains less of severe HA  and  requests narcotics  And complains that the dose of narcotics she is getting is less than her home dose OBJECTIVE Temp:  [97.7 F (36.5 C)-98.2 F (36.8 C)] 98.1 F (36.7 C) (03/19 1744) Pulse Rate:  [60-70] 60 (03/19 1744) Cardiac Rhythm: Normal sinus rhythm (03/19 0700) Resp:  [15-20] 18 (03/19 1744) BP: (123-161)/(65-82) 159/82 (03/19 1744) SpO2:  [94 %-98 %] 98 % (03/19 1744)  CBC:   Recent Labs Lab 02/06/17 1547 02/06/17 1841 02/08/17 0400  WBC 5.1 5.1 4.8  NEUTROABS 3.2 2.9  --   HGB 10.7* 10.5* 9.4*  HCT 34.2* 33.3* 30.4*  MCV 91.7 91.2 92.7  PLT 284 261 254    Basic Metabolic Panel:   Recent Labs Lab 02/06/17 1547 02/08/17 0400  NA 140 139  K 4.6 4.0  CL 106 107  CO2 24 27  GLUCOSE 96 96  BUN 14 12  CREATININE 0.80 0.76  CALCIUM 9.5 9.2  MG  --  1.7    Lipid Panel:     Component Value Date/Time   CHOL 244 (H) 02/07/2017  0110   TRIG 151 (H) 02/07/2017 0110   HDL 59 02/07/2017 0110   CHOLHDL 4.1 02/07/2017 0110   VLDL 30 02/07/2017 0110   LDLCALC 155 (H) 02/07/2017 0110   HgbA1c:  Lab Results  Component Value Date   HGBA1C 5.4 01/16/2017   Urine Drug Screen:     Component Value Date/Time   LABOPIA NONE DETECTED 02/07/2017 0838   COCAINSCRNUR NONE DETECTED 02/07/2017 0838   LABBENZ NONE DETECTED 02/07/2017 0838   AMPHETMU NONE DETECTED 02/07/2017 0838   THCU NONE DETECTED 02/07/2017 0838   LABBARB NONE DETECTED 02/07/2017 0838      IMAGING  Dg Chest 2 View 02/06/2017 1. Small LEFT effusion.  2. Bilateral healed rib fractures.  3. No pulmonary edema, pneumothorax or infiltrate    Ct Head Wo Contrast 02/06/2017 1. No evidence of acute intracranial abnormality.  2. Interval resolution/near complete resolution of bilateral cerebral hypodensities compatible with resolved PRES.  3. Chronic small vessel ischemic disease and chronic left frontal lobe infarct.    Mr Brain Wo Contrast 02/06/2017  . Four punctate foci of low diffusion in left posterior frontal and parietal lobes   new from prior MRI. And new Small left occipital focus   Marked interval diminution in prior findings of PRES with mild residual cortical FLAIR signal abnormality in left  posterior frontal, left parietal, and bilateral occipital lobes.   Background of moderate chronic microvascular ischemic changes and mild parenchymal volume loss of the brain.    MR MRA Head / Brain 01/16/2017 1. 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 2. Motion degraded MRA with multiple questioned anterior circulation stenoses versus motion artifact as above. Consider further evaluation with CTA as an outpatient once the patient's acute illness has  resolved and he is better able to remain motionless. 3. Hypoplastic versus occluded distal left vertebral artery. Widely patent and dominant right vertebral artery. 4. 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  CTA Head 01/24/2017 1. Normal intracranial CTA. 2. Confluent hypoattenuation throughout the periventricular white matter, predominantly within the occipital lobes and left frontal lobe. This is compatible with the diagnosis of PRES, as demonstrated on recent MRI. This may represent the sequela of that episode. 3. No specific finding to explain the reported left eye pain.   Carotid duplex  12/13/2016 No significant extracranial carotid artery stenosis demonstrated,1-39% category.  Vertebral are patent with antegrade flow.   PHYSICAL EXAM Physical Exam General - Well nourished, well developed middle aged caucasian lady, in NAD   Cardiovascular - Regular rate and rhythm Pulmonary: CTA Abdomen: NT, ND, normal bowel sounds Extremities: No C/C/E  Neurological Exam Mental Status: Normal Orientation:  Oriented to person, place and time Speech:  Fluent; no dysarthria  Cranial Nerves:  PERRL; EOMI; visual fields full, face grossly symmetric, hearing grossly intact; shrug symmetric and tongue midline  Motor Exam:  Tone:  Within normal limits; Strength: 5/5 throughout; mildly tremulous  Sensory: Intact to light touch on the left.  She reports slight decrease in sensation on the right  Coordination:  Intact finger to nose  Gait: Deferred   ASSESSMENT/PLAN [name] is a 51 y.o. female with history of PRES, hypertension, COPD, bipolar disorder, and migraine headaches  presenting with left arm tingling, confusion, severe left-sided headache, photophobia, and nausea.  She did not receive IV t-PA due to unclear time of onset.   Strokes:  Multiple diffusion positive tiny lesions ?  Doubt Infarcts  likely new PRES lesion   And T 2 shine thru  Resultant  Decreased sensation on the right  MRI -  Four punctate foci of low diffusion in left posterior frontal and parietal lobes compatible with acute/early subacute ischemia new from prior MRI. Small left occipital focus probable subacute infarction.   MRA - 01/16/2017 -  Motion degraded  CTA Head - 01/24/2017 - compatible with the diagnosis of PRES o/w normal  UDS - positive for opioids in the past.  Negative this admission  Carotid Doppler - 12/14/2016 -  Bilateral carotids 1-39% category. Vertebral are patent with antegrade flow.  2D Echo - 12/13/2016 - EF - 65% to 70%. No cardiac source of emboli identified.  LDL - 155  HgbA1c - 5.4 (01/16/2017)  VTE prophylaxis - SCDs Diet Heart Room service appropriate? Yes; Fluid consistency: Thin Diet NPO time specified  No antithrombotic prior to admission, now on aspirin 325 mg daily  Patient counseled to be compliant with her antithrombotic medications  Ongoing aggressive stroke risk factor management  Therapy recommendations: No f/u therapies recommended  Disposition: Pending  Hypertension  Blood pressure tends to run low  Permissive hypertension (OK if < 220/120) but gradually normalize  in 5-7 days  Long-term BP goal normotensive  Hyperlipidemia  Home meds:  Zocor 40 mg daily resumed in hospital  LDL 155, goal < 70  Confirm compliance. Consider change to Lipitor 80 mg daily.  Continue statin at discharge  Other Stroke Risk Factors  Cigarette smoker - advised to stop smoking  Obesity, Body mass index is 28.62 kg/m., recommend weight loss, diet and exercise as appropriate   Family hx stroke (Mother)  Migraines  Other Active Problems  Anemia - 10.5 / 33.3 -> 9.4 / 30.4  On Keppra 1 gm Bid   Hospital day # 2  I have personally examined this patient, reviewed notes, independently viewed imaging studies, participated in medical decision making and plan of care.ROS completed by me  personally and pertinent positives fully documented  I have made any additions or clarifications directly to the above note. 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 I doubt these represent ischemic  She also has some drug seeking behavior for narcotics. I recommend straight blood pressure control with systolic pressure goes below 1 Continue present management. I do not believe TEE or hypercoagalable panel is necessary..d/w Dr Vega.greater than 50% time during this 25 minute visit was spent on counseling and coordination of care about her presentation and MRI findings and answering questions.Stroke team will sign off. Call for questions.  Antony Contras, MD Medical Director [facility] Pager: [phone]  02/09/2017 8:13 PM     To contact Stroke Continuity provider, please refer to [email or web address] After hours, contact General Neurology

## 2017-02-10 NOTE — Discharge Summary (Signed)
Physician Discharge Summary  [name] WUJ:[id number] DOB: 06-13-1966 DOA: 02/06/2017  PCP: [name], MD  Admit date: 02/06/2017 Discharge date: 02/10/2017  Time spent: > 35 minutes  Recommendations for Outpatient Follow-up:  1.    Discharge Diagnoses:  Active Problems:   Essential hypertension   Asthma   Bipolar I disorder ([place])   Acute CVA (cerebrovascular accident) ([place])   Chest pain   CVA (cerebral vascular accident) [facility])   Discharge Condition: stable  Diet recommendation: heart healthy  Filed Weights   02/06/17 1524 02/07/17 0030  Weight: 73 kg (161 lb) 71 kg (156 lb 8 oz)    History of present illness:  51 y.o.femalewith medical history significant of Seizures, HTN, CKD, PRES, Bipolar, Chronic pain syndrome, hx of Empyema, COPD who presented with Complaints of headaches chest pain and shortness of breath. Initially it was suspected the patient may have a new stroke but after evaluation by neurology currently thinks this is not ischemic and this is secondary to her PRES syndrome. Also patient is suspected to elicit Narcotic seeking behavior  Hospital Course:  CVA (cerebral vascular accident) [facility]) - Neurology on board and recommended the following: I have personally examined this patient, reviewed notes, independently viewed imaging studies, participated in medical decision making and plan of care.ROS completed by me personally and pertinent positives fully documented  I have made any additions or clarifications directly to the above note. 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 I doubt these represent ischemic  She also has some drug seeking behavior for narcotics. I recommend straight blood pressure control with systolic pressure goes below 1 Continue present management. I do not believe TEE or hypercoagalable panel is  necessary.  Active Problems:   Essential hypertension - Continue medication regimen listed below    Asthma - no wheezes, continue pulmicort    Bipolar I disorder ([place]) - Continue home medication regimen listed below    Chest pain - troponin normal. As mentioned above suspect patient is having some narcotic seeking care.   Procedures:  None  Consultations:  Neurology  Discharge Exam: Vitals:   02/10/17 0819 02/10/17 0948  BP: (!) 148/67 140/84  Pulse: 70 65  Resp: 18 18  Temp:  98 F (36.7 C)    General: Pt in nad, alert and awake Cardiovascular: no cyanosis Respiratory: no increased wob, no wheezes  Discharge Instructions   Discharge Instructions    Call MD for:  redness, tenderness, or signs of infection (pain, swelling, redness, odor or green/yellow discharge around incision site)    Complete by:  As directed    Call MD for:  severe uncontrolled pain    Complete by:  As directed    Call MD for:  temperature >100.4    Complete by:  As directed    Diet - low sodium heart healthy    Complete by:  As directed    Discharge instructions    Complete by:  As directed    Please continue good blood pressure control   Increase activity slowly    Complete by:  As directed      Current Discharge Medication List    START taking these medications   Details  acetaminophen (TYLENOL) 325 MG tablet Take 2 tablets (650 mg total) by mouth every 4 (four) hours as needed for mild pain (or temp > 37.5 C (99.5 F)).  amLODipine (NORVASC) 5 MG tablet Take 1 tablet (5 mg total) by mouth daily. Qty: 30 tablet, Refills: 0    atorvastatin (LIPITOR) 20 MG tablet Take 1 tablet (20 mg total) by mouth daily at 6 PM. Qty: 30 tablet, Refills: 0    escitalopram (LEXAPRO) 20 MG tablet Take 2 tablets (40 mg total) by mouth daily. Qty: 30 tablet, Refills: 0    gabapentin (NEURONTIN) 400 MG capsule Take 1 capsule (400 mg total) by mouth 3 (three) times daily. Qty: 90 capsule     lamoTRIgine (LAMICTAL) 100 MG tablet Take 1 tablet (100 mg total) by mouth 2 (two) times daily. Qty: 60 tablet, Refills: 0    magnesium oxide (MAG-OX) 400 (241.3 Mg) MG tablet Take 1 tablet (400 mg total) by mouth 2 (two) times daily. Qty: 60 tablet, Refills: 0      CONTINUE these medications which have NOT CHANGED   Details  QUEtiapine (SEROQUEL) 100 MG tablet Take 100 mg by mouth at bedtime.    budesonide (PULMICORT) 0.5 MG/2ML nebulizer solution Take 2 mLs (0.5 mg total) by nebulization 2 (two) times daily. Qty: 250 mL, Refills: 0    hydrALAZINE (APRESOLINE) 100 MG tablet Take 1 tablet (100 mg total) by mouth every 6 (six) hours. Qty: 90 tablet, Refills: 0    ipratropium-albuterol (DUONEB) 0.5-2.5 (3) MG/3ML SOLN Take 3 mLs by nebulization every 6 (six) hours as needed. Qty: 360 mL, Refills: 0    levETIRAcetam (KEPPRA) 1000 MG tablet Take 1 tablet (1,000 mg total) by mouth 2 (two) times daily. Qty: 60 tablet, Refills: 0    metoprolol (LOPRESSOR) 100 MG tablet Take 1 tablet (100 mg total) by mouth 2 (two) times daily. Qty: 60 tablet, Refills: 0    pantoprazole (PROTONIX) 40 MG tablet Take 1 tablet (40 mg total) by mouth daily at 12 noon. Qty: 30 tablet, Refills: 0      STOP taking these medications     oxyCODONE (OXY IR/ROXICODONE) 5 MG immediate release tablet      simvastatin (ZOCOR) 40 MG tablet        Allergies  Allergen Reactions  . Cymbalta [Duloxetine Hcl] Other (See Comments)    Mood changes...mean  . Paxil [Paroxetine Hcl] Other (See Comments)    Mean and aggressive  . Wellbutrin [Bupropion] Other (See Comments)    Rapid heartrate  . Zoloft [Sertraline] Other (See Comments)    Anger  . Nyquil Multi-Symptom [Pseudoeph-Doxylamine-Dm-Apap] Hives  . Tramadol Itching      The results of significant diagnostics from this hospitalization (including imaging, microbiology, ancillary and laboratory) are listed below for reference.    Significant Diagnostic  Studies: Ct Angio Head W Or Wo Contrast  Result Date: 01/24/2017 CLINICAL DATA:  Seizure.  Left eye pain and headache. EXAM: CT ANGIOGRAPHY HEAD TECHNIQUE: Multidetector CT imaging of the head was performed using the standard protocol during bolus administration of intravenous contrast. Multiplanar CT image reconstructions and MIPs were obtained to evaluate the vascular anatomy. CONTRAST:  50 mL Isovue 370 IV COMPARISON:  Brain MRI 01/16/2017 Head CT 01/15/2017 FINDINGS: CT HEAD Brain: There is confluent hypoattenuation throughout the periventricular white matter, worst in both occipital lobes and in the left frontal lobe. The findings in the left frontal lobe have worsened compared to 01/15/2017. There is no intracranial hemorrhage. No midline shift or mass effect. Vascular: No hyperdense vessel or unexpected calcification. Skull: Normal visualized skull base, calvarium and extracranial soft tissues. Sinuses/Orbits: No sinus fluid levels or advanced mucosal thickening.  No mastoid effusion. Normal orbits. CTA HEAD Anterior circulation: --Intracranial internal carotid arteries: Normal. --Anterior cerebral arteries: Hypoplastic right A1 segment is a normal congenital variant. Normal left ACA. --Middle cerebral arteries:  Normal. --Posterior communicating arteries: Present on the left. Posterior circulation: --Posterior cerebral arteries: Normal. --Superior cerebellar arteries: Normal. --Basilar artery: Normal.  Vertebral arteries are right-dominant. --Anterior inferior cerebellar arteries: Normal. --Posterior inferior cerebellar arteries: Normal. Venous sinuses: As permitted by contrast timing, patent. Anatomic variants: Hypoplastic right A1 segment. Delayed phase: No parenchymal contrast enhancement. IMPRESSION: 1. Normal intracranial CTA. 2. Confluent hypoattenuation throughout the periventricular white matter, predominantly within the occipital lobes and left frontal lobe. This is compatible with the diagnosis of  PRES, as demonstrated on recent MRI. This may represent the sequela of that episode. 3. No specific finding to explain the reported left eye pain. Electronically Signed   By: Ulyses Jarred M.D.   On: 01/24/2017 04:08   Dg Chest 2 View  Result Date: 02/06/2017 CLINICAL DATA:  Short of breath and confusion.  Lethargy. EXAM: CHEST  2 VIEW COMPARISON:  02/12/2017 FINDINGS: Normal mediastinum and cardiac silhouette. Normal pulmonary vasculature. No evidence of infiltrate, or pneumothorax. Small effusion on the LEFT. No acute bony abnormality. Healed rib fractures on the LEFT and RIGHT. IMPRESSION: 1. Small LEFT effusion. 2. Bilateral healed rib fractures. 3. No pulmonary edema, pneumothorax or infiltrate Electronically Signed   By: Suzy Bouchard M.D.   On: 02/06/2017 16:40   Dg Chest 2 View  Result Date: 01/23/2017 CLINICAL DATA:  Chest pain with deep breaths. EXAM: CHEST  2 VIEW COMPARISON:  01/15/2017 chest radiograph.  12/25/2016 chest CT FINDINGS: Stable normal cardiac silhouette given projection and technique. Stable blunting of left costal diaphragmatic angle and left-sided pleural thickening. Stable left mid lung zone pulmonary cavitary lesion. No new focal consolidation. No pneumothorax. Bones are unremarkable. Right upper quadrant cholecystectomy clips. IMPRESSION: Stable blunting of left costal diaphragmatic angle of left-sided pleural thickening. Stable left mid lung zone cavitary lesion. No new acute pulmonary process. Electronically Signed   By: Kristine Garbe M.D.   On: 01/23/2017 21:32   Ct Head Wo Contrast  Result Date: 02/06/2017 CLINICAL DATA:  Headache for 2 days.  Stroke earlier this year. EXAM: CT HEAD WITHOUT CONTRAST TECHNIQUE: Contiguous axial images were obtained from the base of the skull through the vertex without intravenous contrast. COMPARISON:  Head CTA 01/24/2017 and MRI and 01/16/2017 FINDINGS: Brain: Regions of hypoattenuation in the posterior cerebral hemispheres  bilaterally and high left frontal lobe on the prior CT have largely resolved, with possible mild residual hypoattenuation in the left occipital lobe. Patchy cerebral white matter hypodensities elsewhere are nonspecific but likely reflect the sequelae of chronic small vessel ischemia. A chronic infarct is again noted in the anterior left frontal lobe. There is no definite evidence of acute cortically based infarct, acute intracranial hemorrhage, mass, midline shift, or extra-axial fluid collection. The ventricles are unchanged in size, without evidence of hydrocephalus. Vascular: No hyperdense vessel or unexpected calcification. Skull: No fracture or focal osseous lesion. Sinuses/Orbits: Paranasal sinuses and mastoid air cells are clear. Unremarkable orbits. Other: None. IMPRESSION: 1. No evidence of acute intracranial abnormality. 2. Interval resolution/near complete resolution of bilateral cerebral hypodensities compatible with resolved PRES. 3. Chronic small vessel ischemic disease and chronic left frontal lobe infarct. Electronically Signed   By: Logan Bores M.D.   On: 02/06/2017 18:28   Ct Head Wo Contrast  Result Date: 01/15/2017 CLINICAL DATA:  Per EMS, pt from  [facility], staff reports pt reported she is not able to see. Did not report this to the Paramedic, however, she would not open her eyes and states that she does not feel good. EXAM: CT HEAD WITHOUT CONTRAST TECHNIQUE: Contiguous axial images were obtained from the base of the skull through the vertex without intravenous contrast. COMPARISON:  12/12/2016 FINDINGS: Brain: Progressive hypoattenuation posteriorly in both occipital lobes, left greater than right. Subacute nonhemorrhagic left cerebellar infarct. No acute intracranial hemorrhage. 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 Patchy areas of hypoattenuation in deep and periventricular white matter bilaterally. No midline shift. No extra-axial mass. No hydrocephalus. Vascular:  No hyperdense vessel or unexpected calcification. Skull: Normal. Negative for fracture or focal lesion. Sinuses/Orbits: No acute finding. Other: None. IMPRESSION: 1. Progressive parenchymal hypoattenuation in left cerebellum and bilateral posterior occipital lobes. 2. No hemorrhage or other acute finding. Electronically Signed   By: Lucrezia Europe M.D.   On: 01/15/2017 18:24   Mr Brain Wo Contrast  Result Date: 02/06/2017 CLINICAL DATA:  51 y/o  F; severe headache with left-sided weakness. EXAM: MRI HEAD WITHOUT CONTRAST TECHNIQUE: Multiplanar, multiecho pulse sequences of the brain and surrounding structures were obtained without intravenous contrast. COMPARISON:  02/06/2017 CT of the head.  01/16/2017 MRI of the head. FINDINGS: Brain: 4 punctate foci of low diffusion within the left posterior frontal and parietal lobes are compatible with acute/ early subacute ischemia. Small focus of diffusion hyperintensity with intermediate ADC in the left occipital lobe likely representing subacute infarction. Small chronic infarct within the left inferolateral frontal lobe. Marked diminution in T2 FLAIR hyperintense signal abnormality from prior MRI of the brain with mild residual cortical FLAIR signal abnormality in the left posterior frontal, left parietal, left greater than right occipital lobes. Background of scattered T2 FLAIR hyperintense foci in subcortical and periventricular white matter compatible with moderate chronic microvascular ischemic changes. Mild brain parenchymal volume loss. Vascular: Normal flow voids. Skull and upper cervical spine: Normal marrow signal. Sinuses/Orbits: Negative. Other: None. IMPRESSION: 1. Four punctate foci of low diffusion in left posterior frontal and parietal lobes compatible with acute/early subacute ischemia new from prior MRI. 2. Small left occipital focus probable subacute infarction. 3. Marked interval diminution in prior findings of PRES with mild residual cortical FLAIR  signal abnormality in left posterior frontal, left parietal, and bilateral occipital lobes. 4. Background of moderate chronic microvascular ischemic changes and mild parenchymal volume loss of the brain. These results will be called to the ordering clinician or representative by the Radiologist Assistant, and communication documented in the PACS or zVision Dashboard. Electronically Signed   By: Kristine Garbe M.D.   On: 02/06/2017 21:49   Mr Brain Wo Contrast  Result Date: 01/16/2017 CLINICAL DATA:  New onset visual changes and seizure activity. Elevated blood pressure (215/127). EXAM: MRI HEAD WITHOUT CONTRAST MRA HEAD WITHOUT CONTRAST TECHNIQUE: Multiplanar, multiecho pulse sequences of the brain and surrounding structures were obtained without intravenous contrast. Angiographic images of the head were obtained using MRA technique without contrast. COMPARISON:  Head CT 01/15/2017 and MRI 12/14/2016 FINDINGS: MRI HEAD FINDINGS Multiple sequences are moderately motion degraded. Brain: A subcentimeter focus of abnormal diffusion signal is again seen in the left cerebellum, however there is new masslike confluent T2 hyperintensity in the left greater than right cerebellar hemispheres without significant posterior fossa mass effect. The there are also multiple new regions of relatively extensive, patchy and confluent cortical and subcortical T2 hyperintensity in both cerebral hemispheres, greatest in the occipital  and posterior temporal lobes bilaterally as well as left parietal and high left frontal lobes. There is milder involvement of the right parietal and right frontal lobes. Diffusion is largely facilitated in these regions, however there are areas of predominantly cortically based restricted diffusion most notable in the occipital lobes. No definite intracranial hemorrhage is identified. There is no midline shift or extra-axial fluid collection. Ventricles are unchanged in size. Encephalomalacia is  again noted the anteroinferiorly in the left frontal lobe and may reflect remote trauma or prior ischemic infarction. Vascular: Non-visualization of the distal left vertebral artery, unchanged. Skull and upper cervical spine: Unremarkable bone marrow signal. Sinuses/Orbits: No gross acute abnormality. Other: None. MRA HEAD FINDINGS The study is moderately motion degraded. The visualized distal right vertebral artery is dominant and widely patent, supplying the basilar. A left vertebral artery is not identified and may be occluded or hypoplastic. The basilar artery is patent without evidence of significant stenosis. Left AICA and bilateral SCAs are visualized. There is a small posterior communicating artery on the left. PCAs are patent without evidence of significant proximal stenosis. The internal carotid arteries are patent from skullbase to carotid termini without evidence of flow limiting stenosis. A 2 x 2 mm outpouching is present at the left posterior communicating origin. ACAs and MCAs are patent. The left A1 segment is dominant. A potentially high-grade stenosis is questioned at the right ACA origin with potential moderate stenosis more distally in the right A1 segment, however this appearance may be at least partly due to motion artifact. There may be a moderate distal left M1 stenosis, and proximal M2 stenoses are questioned bilaterally. IMPRESSION: 1. 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 2. Motion degraded MRA with multiple questioned anterior circulation stenoses versus motion artifact as above. Consider further evaluation with CTA as an outpatient once the patient's acute illness has resolved and he is better able to remain motionless. 3. Hypoplastic versus occluded distal left vertebral artery. Widely  patent and dominant right vertebral artery. 4. 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 Electronically Signed   By: Logan Bores M.D.   On: 01/16/2017 13:28   Dg Chest Port 1 View  Result Date: 01/15/2017 CLINICAL DATA:  visual changes. Seems like she has AMS. Pt started seizing and drooling while EMT was putting patient on bedpain. Pt eyes deviated to right. Pt able to move freely but making clonic movements with her feet. Hx asthma, COPD, HTN, current smoker EXAM: PORTABLE CHEST - 1 VIEW COMPARISON:  01/08/2017 FINDINGS: Cavitary lesion in the left mid lung laterally as before. Left lateral pleural thickening or loculated effusion. No new infiltrate. Heart size upper limits normal. No pneumothorax. Visualized bones unremarkable. IMPRESSION: Stable findings in the left hemithorax. No acute or superimposed abnormality. Electronically Signed   By: Lucrezia Europe M.D.   On: 01/15/2017 18:19   Dg Abd Portable 1v  Result Date: 01/17/2017 CLINICAL DATA:  NG tube placement. EXAM: PORTABLE ABDOMEN - 1 VIEW COMPARISON:  Single-view of the abdomen 01/16/2017. FINDINGS: Feeding tube is again seen with the tip at the ligament of Treitz. No new tube is identified. IMPRESSION: Feeding tube tip is at the ligament of Treitz, unchanged. No new tube is seen. Electronically Signed   By: Inge Rise M.D.   On: 01/17/2017 11:14   Dg Abd Portable 1v  Result Date: 01/16/2017 CLINICAL DATA:  NG tube placement EXAM: PORTABLE ABDOMEN - 1 VIEW COMPARISON:  12/28/2016 FINDINGS: Feeding tube course of the stomach into the duodenum with the tip near the ligament of Treitz. Normal bowel gas pattern. IMPRESSION: Feeding tube in the duodenum near the ligament of Treitz. Electronically Signed   By: Franchot Gallo M.D.   On: 01/16/2017 18:53   Mr Jodene Nam Head/brain TM Cm  Result Date: 01/16/2017 CLINICAL DATA:  New onset visual changes and seizure activity. Elevated blood pressure  (215/127). EXAM: MRI HEAD WITHOUT CONTRAST MRA HEAD WITHOUT CONTRAST TECHNIQUE: Multiplanar, multiecho pulse sequences of the brain and surrounding structures were obtained without intravenous contrast. Angiographic images of the head were obtained using MRA technique without contrast. COMPARISON:  Head CT 01/15/2017 and MRI 12/14/2016 FINDINGS: MRI HEAD FINDINGS Multiple sequences are moderately motion degraded. Brain: A subcentimeter focus of abnormal diffusion signal is again seen in the left cerebellum, however there is new masslike confluent T2 hyperintensity in the left greater than right cerebellar hemispheres without significant posterior fossa mass effect. The there are also multiple new regions of relatively extensive, patchy and confluent cortical and subcortical T2 hyperintensity in both cerebral hemispheres, greatest in the occipital and posterior temporal lobes bilaterally as well as left parietal and high left frontal lobes. There is milder involvement of the right parietal and right frontal lobes. Diffusion is largely facilitated in these regions, however there are areas of predominantly cortically based restricted diffusion most notable in the occipital lobes. No definite intracranial hemorrhage is identified. There is no midline shift or extra-axial fluid collection. Ventricles are unchanged in size. Encephalomalacia is again noted the anteroinferiorly in the left frontal lobe and may reflect remote trauma or prior ischemic infarction. Vascular: Non-visualization of the distal left vertebral artery, unchanged. Skull and upper cervical spine: Unremarkable bone marrow signal. Sinuses/Orbits: No gross acute abnormality. Other: None. MRA HEAD FINDINGS The study is moderately motion degraded. The visualized distal right vertebral artery is dominant and widely patent, supplying the basilar. A left vertebral artery is not identified and may be occluded or hypoplastic. The basilar artery is patent without  evidence of significant stenosis. Left AICA and bilateral SCAs are visualized. There is a small posterior communicating artery on the left. PCAs are patent without evidence of significant proximal stenosis. The internal carotid arteries are patent from skullbase to carotid termini without evidence of flow limiting stenosis. A 2 x 2 mm outpouching is present at the left posterior communicating origin. ACAs and MCAs are patent. The left A1 segment is dominant. A potentially high-grade stenosis is questioned at the right ACA origin with potential moderate stenosis more distally in the right A1 segment, however this appearance may be at least partly due to motion artifact. There may be a moderate distal left M1 stenosis, and proximal M2 stenoses are questioned bilaterally. IMPRESSION: 1. 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 2. Motion degraded MRA with multiple questioned anterior circulation stenoses versus motion artifact as above. Consider further evaluation with CTA as an outpatient once the patient's acute illness has resolved and he is better able to remain motionless. 3. Hypoplastic versus occluded distal left vertebral artery. Widely patent and dominant right vertebral artery. 4. 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 Electronically Signed   By: Logan Bores M.D.   On:  01/16/2017 13:28    Microbiology: No results found for this or any previous visit (from the past 240 hour(s)).   Labs: Basic Metabolic Panel:  Recent Labs Lab 02/06/17 1547 02/08/17 0400  NA 140 139  K 4.6 4.0  CL 106 107  CO2 24 27  GLUCOSE 96 96  BUN 14 12  CREATININE 0.80 0.76  CALCIUM 9.5 9.2  MG  --  1.7   Liver Function Tests: No results for input(s): AST, ALT,  ALKPHOS, BILITOT, PROT, ALBUMIN in the last 168 hours. No results for input(s): LIPASE, AMYLASE in the last 168 hours. No results for input(s): AMMONIA in the last 168 hours. CBC:  Recent Labs Lab 02/06/17 1547 02/06/17 1841 02/08/17 0400  WBC 5.1 5.1 4.8  NEUTROABS 3.2 2.9  --   HGB 10.7* 10.5* 9.4*  HCT 34.2* 33.3* 30.4*  MCV 91.7 91.2 92.7  PLT 284 261 236   Cardiac Enzymes:  Recent Labs Lab 02/07/17 0110 02/07/17 1006 02/07/17 1322 02/09/17 0834  TROPONINI <0.03 <0.03 <0.03 <0.03   BNP: BNP (last 3 results) No results for input(s): BNP in the last 8760 hours.  ProBNP (last 3 results) No results for input(s): PROBNP in the last 8760 hours.  CBG:  Recent Labs Lab 02/09/17 2139  GLUCAP 94    Signed:  Velvet Bathe MD.  [facility] 02/10/2017, 4:22 PM

## 2017-02-10 NOTE — Progress Notes (Signed)
Nutrition Brief Note  Patient identified on the Malnutrition Screening Tool (MST) Report  Wt Readings from Last 15 Encounters:  02/07/17 156 lb 8 oz (71 kg)  01/20/17 161 lb 2.5 oz (73.1 kg)  01/12/17 171 lb 1.6 oz (77.6 kg)  12/01/16 165 lb 12.8 oz (75.2 kg)  08/14/16 170 lb (77.1 kg)  02/21/16 176 lb 9.4 oz (80.1 kg)  12/17/15 193 lb 8 oz (87.8 kg)  08/21/14 160 lb (72.6 kg)  08/20/14 160 lb (72.6 kg)  03/06/14 159 lb (72.1 kg)  02/16/14 163 lb (73.9 kg)  02/12/14 155 lb (70.3 kg)  09/20/13 165 lb (74.8 kg)    Body mass index is 28.62 kg/m. Patient meets criteria for Overweight based on current BMI. Per Weight history, pt has lost 5.5% wt loss in the past 2 months (not significant for time frame). Nutrition-focused physical exam findings were no fat depletion and no muscle depletion. Pt relates weight loss to frequent hospitalization and small decrease in appetite. She feels confident in her ability to eat adequately to maintain her weight now and she declines nutrition interventions at this time. Pt requested nutrition information regarding stroke.   Lipid Panel     Component Value Date/Time   CHOL 244 (H) 02/07/2017 0110   TRIG 151 (H) 02/07/2017 0110   HDL 59 02/07/2017 0110   CHOLHDL 4.1 02/07/2017 0110   VLDL 30 02/07/2017 0110   LDLCALC 155 (H) 02/07/2017 0110    RD provided "Heart Healthy Nutrition Therapy" handout from the Academy of Nutrition and Dietetics. Reviewed patient's dietary recall. Provided examples on ways to decrease sodium and fat intake in diet. Discouraged intake of processed foods and use of salt shaker. Encouraged fresh fruits and vegetables as well as whole grain sources of carbohydrates to maximize fiber intake. Encouraged intake of beans, nuts, and healthy fats. Recommended omega-3 supplement daily. Teach back method used.  Expect good compliance.  Body mass index is 28.62 kg/m. Pt meets criteria for Overweight based on current BMI.  Current  diet order is Heart Healthy, patient is consuming approximately 75% of meals at this time. Labs and medications reviewed. No further nutrition interventions warranted at this time. RD contact information provided. If additional nutrition issues arise, please re-consult RD.  Scarlette Ar RD, LDN, CSP Inpatient Clinical Dietitian Pager: [phone] After Hours Pager: [phone]

## 2017-02-10 NOTE — Care Management Note (Signed)
Case Management Note  Patient Details  Name: [name] MRN: [id number] Date of Birth: 08/07/1966  Subjective/Objective:                    Action/Plan: Pt discharging home with [facility] RN. CM spoke with the patient and provided a list of [facility] agencies. [name]. [name] with [facility] notified and accepted the referral.  Expected Discharge Date:  02/10/17               Expected Discharge Plan:  [name]  In-House Referral:     Discharge planning Services  CM Consult  Post Acute Care Choice:  Home Health Choice offered to:     DME Arranged:    DME Agency:     HH Arranged:  RN [place] Agency:  [place]  Status of Service:  Completed, signed off  If discussed at [place] of Stay Meetings, dates discussed:    Additional Comments:  [name], RN 02/10/2017, 5:15 PM

## 2017-02-10 NOTE — Progress Notes (Signed)
Patient admitted to Endo and ready for procedure. Spoke with Dr. Radford Pax who noted that procedure was no longer needed per Dr. Clydene Fake latest progress note. RN spoke with Dr. Leonie Man to verify procedure not needed. RN also spoke with Dr. Wendee Beavers and relayed information. Dr. Radford Pax aware of case cancellation as well.

## 2017-02-10 NOTE — Progress Notes (Addendum)
Patient states she has 10/10 chest pain, 10/10 headache, states her stomach hurts and she feels nauseated, states she is also dizzy. MD notified. Per MD, continue with discharge.  RN discussed discharge instructions with patient. Patient states she already has a PCP appointment for Thursday and a psychiatrist appt on Friday. She states she will go to these appointments and has transportation set up. Given prescriptions at discharge. Kelli with case management spoke with pharmacy r/t cost of medications, states they are at normal cost for patient. Patient aware. Per Dr. Wendee Beavers, no asa prescribed. Neuro assessment is unchanged.  Tele removed, IV removed Patient states she knows which medications to take tonight and tomorrow morning, given medication list in AVS with directions. Will continue to monitor.

## 2017-02-12 LAB — ACID FAST CULTURE WITH REFLEXED SENSITIVITIES (MYCOBACTERIA)
Acid Fast Culture: NEGATIVE
Acid Fast Culture: NEGATIVE

## 2017-02-13 ENCOUNTER — Telehealth: Payer: Self-pay | Admitting: Allergy & Immunology

## 2017-02-13 NOTE — Telephone Encounter (Signed)
Pt called and said that she was doing better because she stopped smoking and only problem is with her nose and she can handle that. She said that she did not need to see [place] any more.

## 2017-02-13 NOTE — Telephone Encounter (Signed)
I am glad that she quit smoking. We are happy to see her as needed.   Thanks, Salvatore Marvel, MD [place] [facility] [place]

## 2017-02-16 ENCOUNTER — Other Ambulatory Visit: Payer: Self-pay | Admitting: *Deleted

## 2017-02-16 NOTE — Patient Outreach (Signed)
[facility]) Care Management  02/16/2017  [name] [birth year] [id number]  EMMI-Stroke referral with red dashboard for feeling worse, new problems:  Telephone call to patient who was advised of reason for calling. Patient refused to verify HIPPA information & disconnected call.  Plan: Close case. Discontinue EMMI calls  Send to care management assistant.  Sherrin Daisy, RN BSN [place] Management Coordinator [facility] Care Management  [phone]

## 2017-02-18 ENCOUNTER — Ambulatory Visit: Payer: Self-pay | Admitting: Cardiothoracic Surgery

## 2017-02-25 ENCOUNTER — Ambulatory Visit: Payer: Self-pay | Admitting: Cardiothoracic Surgery

## 2017-02-26 ENCOUNTER — Ambulatory Visit: Payer: Medicaid Other | Admitting: Allergy & Immunology

## 2017-02-26 ENCOUNTER — Ambulatory Visit
Admission: RE | Admit: 2017-02-26 | Discharge: 2017-02-26 | Disposition: A | Discharge status: Home / Self Care | Payer: Medicaid Other | Source: Ambulatory Visit | Attending: Cardiothoracic Surgery | Admitting: Cardiothoracic Surgery

## 2017-02-26 ENCOUNTER — Ambulatory Visit (INDEPENDENT_AMBULATORY_CARE_PROVIDER_SITE_OTHER): Payer: Self-pay | Admitting: Physician Assistant

## 2017-02-26 VITALS — BP 111/73 | HR 69 | Resp 20 | Ht 62.0 in | Wt 155.0 lb

## 2017-02-26 DIAGNOSIS — J869 Pyothorax without fistula: Secondary | ICD-10-CM

## 2017-02-26 DIAGNOSIS — J939 Pneumothorax, unspecified: Secondary | ICD-10-CM

## 2017-02-26 DIAGNOSIS — Z09 Encounter for follow-up examination after completed treatment for conditions other than malignant neoplasm: Secondary | ICD-10-CM

## 2017-02-26 NOTE — Progress Notes (Signed)
[address]       [place] [address]               [phone]      [name] is a 51 y.o. female patient who returns status post VATS, decortication, drainage of an empyema on 12/26/2016. She missed her original follow-up appointment and has rescheduled for today.   1. Empyema ([place])   2. Surgery follow-up examination    Past Medical History:  Diagnosis Date  . Allergic rhinitis   . Arthritis   . Asthma   . Back pain   . Bipolar 1 disorder ([place])   . C. difficile diarrhea   . Cavitary lesion of lung   . Chronic low back pain 03/06/2014  . COPD (chronic obstructive pulmonary disease) ([place])   . Depression   . Fibroid   . Hypertension   . Hypomagnesemia   . IBS (irritable bowel syndrome)   . Migraine   . Scoliosis    No past surgical history pertinent negatives on file. Current Outpatient Prescriptions on File Prior to Visit  Medication Sig Dispense Refill  . acetaminophen (TYLENOL) 325 MG tablet Take 2 tablets (650 mg total) by mouth every 4 (four) hours as needed for mild pain (or temp > 37.5 C (99.5 F)).    Marland Kitchen amLODipine (NORVASC) 5 MG tablet Take 1 tablet (5 mg total) by mouth daily. 30 tablet 0  . atorvastatin (LIPITOR) 20 MG tablet Take 1 tablet (20 mg total) by mouth daily at 6 PM. 30 tablet 0  . budesonide (PULMICORT) 0.5 MG/2ML nebulizer solution Take 2 mLs (0.5 mg total) by nebulization 2 (two) times daily. 250 mL 0  . escitalopram (LEXAPRO) 20 MG tablet Take 2 tablets (40 mg total) by mouth daily. 30 tablet 0  . gabapentin (NEURONTIN) 400 MG capsule Take 1 capsule (400 mg total) by mouth 3 (three) times daily. 90 capsule 0  . hydrALAZINE (APRESOLINE) 100 MG tablet Take 1 tablet (100 mg total) by mouth every 6 (six) hours. 90 tablet 0  . ipratropium-albuterol (DUONEB) 0.5-2.5 (3) MG/3ML SOLN Take 3 mLs by nebulization every 6 (six) hours as needed. 360 mL 0  . lamoTRIgine (LAMICTAL) 100 MG tablet Take 1 tablet (100 mg total) by mouth 2 (two)  times daily. 60 tablet 0  . levETIRAcetam (KEPPRA) 1000 MG tablet Take 1 tablet (1,000 mg total) by mouth 2 (two) times daily. 60 tablet 0  . magnesium oxide (MAG-OX) 400 (241.3 Mg) MG tablet Take 1 tablet (400 mg total) by mouth 2 (two) times daily. 60 tablet 0  . metoprolol (LOPRESSOR) 100 MG tablet Take 1 tablet (100 mg total) by mouth 2 (two) times daily. 60 tablet 0  . QUEtiapine (SEROQUEL) 100 MG tablet Take 300 mg by mouth at bedtime.      No current facility-administered medications on file prior to visit.     Allergies  Allergen Reactions  . Cymbalta [Duloxetine Hcl] Other (See Comments)    Mood changes...mean  . Paxil [Paroxetine Hcl] Other (See Comments)    Mean and aggressive  . Wellbutrin [Bupropion] Other (See Comments)    Rapid heartrate  . Zoloft [Sertraline] Other (See Comments)    Anger  . Nyquil Multi-Symptom [Pseudoeph-Doxylamine-Dm-Apap] Hives  . Tramadol Itching   Active Problems:   * No active hospital problems. *  Blood pressure 111/73, pulse 69, resp. rate 20, height 5\' 2"  (1.575 m), weight 70.3 kg (155 lb), SpO2 94 %.  CLINICAL DATA:  Status post decortication on the left on December 26, 2016. The patient has experience left-sided chest pain for the past several days.  EXAM: CHEST  2 VIEW  COMPARISON:  PA and lateral chest x-ray of February 06, 2017  FINDINGS: The lungs are well-expanded. There is no pneumothorax or pleural effusion. There is linear increased density peripherally in the left mid lung more conspicuous than on the previous study. There are healing rib fractures bilaterally. The heart and pulmonary vascularity are normal. The mediastinum is normal in width. The thoracic vertebral bodies are preserved in height.  IMPRESSION: Linear increased density peripherally in the left mid lung compatible with atelectasis. There is no pneumothorax, pleural effusion, nor other acute cardiopulmonary abnormality.   Electronically Signed    By: David  Martinique M.D.   On: 02/26/2017 14:01  Subjective: Shares that her head is bothering her this visit and that she regularly gets migraine headaches. She also shares that she does occasional have some surgical pain from the VATs site.   Objective: Cor: RRR, no murmur Pulm-CTA bilaterally with occasional inspiratory wheeze Extremities- no edema Incision-healed without evidence of infection  Assessment & Plan:   [name] is a 51 y.o. femalewith a PMH of 45 pack year history of tobacco abuse, asthma, allergic rhinitis, COPD, CVA, hypertension and bipolar disorder. On 12/26/2016 she underwent VATS, decortication, drainage of an empyema with Dr. Prescott Gum. She missed her original follow-up appointment therefore, today is her first appointment with our service. In the interim, she had been admitted several times once for seizure, once for headache, and once for acute CVA. She has been following her primary care provider closely and has an appointment in 2 weeks. She also has an appointment with the neurologist, however she was unsure of when. At this point she is getting around well after surgery. She is limited with some residual numbness from her CVA. She drove herself to this appointment today, however I do not believe she should be driving. I expressed this to the patient and she confirmed that she is not supposed to drive for 6 months. However, per the patient due to difficult social events she has no choice at times. Her chest x-ray today showed remarkable improvement. I reviewed the image with Dr. Prescott Gum and he agreed. The patient today did mention that she has some soreness around the surgical site from time to time and was asking for pain medicine. I let her know that our office does not give pain medicine 2 months out from surgery. She insisted that she never received any pain medicine when she went home from the hospital. She is on chronic oxycodone for her back pain and I suggested that  she get a refill of that from her primary care provider when she visits him in 2 weeks. I suggested Tylenol for her surgical pain since she is 2 months from surgery. She did complain of a slight headache today with some pain around her eyes. I informed her that if the pain becomes worse she needs to go straight to the emergency department given her past history of stroke. She also has chronic migraines and I suggested that she bring that up at her primary care appointment as well so that it may be addressed at that time. She is doing very well from a thoracic standpoint and does not need to come back to the office to see [place]. She may follow-up with our service on an as-needed basis. If she  has any questions, comments, or concerns she is always welcome to call our office.   Elgie Collard, PA-C 02/26/2017

## 2017-02-26 NOTE — Patient Instructions (Addendum)
Make every effort to maintain a "heart-healthy" lifestyle with regular physical exercise and adherence to a low-fat, low-carbohydrate diet.  Continue to seek regular follow-up appointments with your primary care physician and/or cardiologist.  Make every effort to stay physically active, get some type of exercise on a regular basis, and stick to a "heart healthy diet".  The long term benefits for regular exercise and a healthy diet are critically important to your overall health and wellbeing.  Return to the office as needed. No scheduled follow-up needed.

## 2017-03-16 ENCOUNTER — Ambulatory Visit: Payer: Medicaid Other | Admitting: Allergy & Immunology

## 2017-03-24 ENCOUNTER — Ambulatory Visit: Payer: Medicaid Other | Admitting: Allergy and Immunology

## 2017-03-30 ENCOUNTER — Ambulatory Visit: Payer: Medicaid Other | Admitting: Allergy & Immunology

## 2017-04-03 ENCOUNTER — Ambulatory Visit (INDEPENDENT_AMBULATORY_CARE_PROVIDER_SITE_OTHER): Payer: Medicaid Other | Admitting: Allergy

## 2017-04-03 ENCOUNTER — Encounter: Payer: Self-pay | Admitting: Allergy

## 2017-04-03 VITALS — BP 100/60 | HR 56 | Temp 98.3°F | Resp 16 | Ht 61.5 in

## 2017-04-03 DIAGNOSIS — J449 Chronic obstructive pulmonary disease, unspecified: Secondary | ICD-10-CM

## 2017-04-03 DIAGNOSIS — J310 Chronic rhinitis: Secondary | ICD-10-CM | POA: Diagnosis not present

## 2017-04-03 DIAGNOSIS — J4550 Severe persistent asthma, uncomplicated: Secondary | ICD-10-CM | POA: Diagnosis not present

## 2017-04-03 MED ORDER — IPRATROPIUM-ALBUTEROL 0.5-2.5 (3) MG/3ML IN SOLN: 3.0000 mL | Freq: Four times a day (QID) | RESPIRATORY_TRACT | 1 refills | Status: DC | PRN

## 2017-04-03 MED ORDER — CARBINOXAMINE MALEATE 6 MG PO TABS: 1.0000 | ORAL_TABLET | Freq: Three times a day (TID) | ORAL | 0 refills | Status: DC

## 2017-04-03 MED ORDER — ALBUTEROL SULFATE HFA 108 (90 BASE) MCG/ACT IN AERS: 2.0000 | INHALATION_SPRAY | Freq: Four times a day (QID) | RESPIRATORY_TRACT | 1 refills | Status: AC | PRN

## 2017-04-03 MED ORDER — MOMETASONE FURO-FORMOTEROL FUM 200-5 MCG/ACT IN AERO: 2.0000 | INHALATION_SPRAY | Freq: Two times a day (BID) | RESPIRATORY_TRACT | 5 refills | Status: DC

## 2017-04-03 MED ORDER — TIOTROPIUM BROMIDE MONOHYDRATE 1.25 MCG/ACT IN AERS: 2.0000 | INHALATION_SPRAY | Freq: Every day | RESPIRATORY_TRACT | 5 refills | Status: DC

## 2017-04-03 NOTE — Progress Notes (Signed)
Follow-up Note  RE: [name] MRN: [id number] DOB: 1966-03-11 Date of Office Visit: 04/03/2017   History of present illness: [name] is a 51 y.o. female presenting today for follow-up of asthma and chronic rhinitis.  She was last seen in the office on 12/01/16 by myself at which time she was doing relatively well in regards to her asthma.  After this visit she was admitted to the hospital on January 19 due to multi lobar pneumonia with acute respiratory failure and developed an empyema requiring chest tubes/VATS.  During the admission she was noted to be encephalopathic and also developed an ileus, pneumothorax requiring intubation. She was treated with vancomycin and linezolid. She was able to be discharged however she was hospitalized again at the end of February February 22 due to seizure and found to have PRES.  she again was discharged and return the following day to the ED with complaint of chest pain,thigh pain and visual disturbance.  She again returned to the ED on March 16 with another hospital admission for acute CVA secondary to hypertension/PRES. she is now on antihypertensive medications. she spent a period of time in a skilled nursing facility. She does not recall much of what has happened over the past 3-4 months.  She does report on the nursing facility that most of her medications were "messed up".  Since she has been home she has been without her routine asthma medicines which included Dulera, Qvar and Spiriva.  She only reports having access to [employer] which she has been using 3 times a day.  She also has not been able to get her monthly Nucala since December due to her multiple hospitalizations.   She reports if she goes outside eyes start to itch and may swell.  She is also having more nasal congestion and drainage which is causing sore throat.  She is on claritin which she has been on for years. She ran out of her nasal sprays.     Review of systems: Review of Systems   Constitutional: Positive for malaise/fatigue. Negative for chills and fever.  HENT: Positive for congestion and sore throat. Negative for ear discharge, ear pain, nosebleeds, sinus pain and tinnitus.   Eyes: Negative for pain, discharge and redness.  Respiratory: Positive for cough, shortness of breath and wheezing.   Cardiovascular: Positive for chest pain (at chest tube sites).  Gastrointestinal: Negative for abdominal pain, diarrhea, nausea and vomiting.  Musculoskeletal: Negative for myalgias.  Skin: Negative for itching and rash.  Neurological: Negative for headaches.    All other systems negative unless noted above in HPI  Past medical/social/surgical/family history have been reviewed and are unchanged unless specifically indicated below.  No changes  Medication List: Allergies as of 04/03/2017      Reactions   Cymbalta [duloxetine Hcl] Other (See Comments)   Mood changes...mean   Paxil [paroxetine Hcl] Other (See Comments)   Mean and aggressive Mean and aggressive   Wellbutrin [bupropion] Other (See Comments)   Rapid heartrate   Zoloft [sertraline] Other (See Comments)   Anger Anger   Nyquil Multi-symptom [pseudoeph-doxylamine-dm-apap] Hives   Tramadol Itching      Medication List       Accurate as of 04/03/17  4:54 PM. Always use your most recent med list.          acetaminophen 325 MG tablet Commonly known as:  TYLENOL Take 2 tablets (650 mg total) by mouth every 4 (four) hours as needed for  mild pain (or temp > 37.5 C (99.5 F)).   amLODipine 5 MG tablet Commonly known as:  NORVASC Take 1 tablet (5 mg total) by mouth daily.   budesonide 0.5 MG/2ML nebulizer solution Commonly known as:  PULMICORT Take 2 mLs (0.5 mg total) by nebulization 2 (two) times daily.   escitalopram 20 MG tablet Commonly known as:  LEXAPRO Take 2 tablets (40 mg total) by mouth daily.   gabapentin 400 MG capsule Commonly known as:  NEURONTIN Take 1 capsule (400 mg total) by  mouth 3 (three) times daily.   hydrALAZINE 100 MG tablet Commonly known as:  APRESOLINE Take 1 tablet (100 mg total) by mouth every 6 (six) hours.   ipratropium-albuterol 0.5-2.5 (3) MG/3ML Soln Commonly known as:  DUONEB Take 3 mLs by nebulization every 6 (six) hours as needed.   lamoTRIgine 100 MG tablet Commonly known as:  LAMICTAL Take 1 tablet (100 mg total) by mouth 2 (two) times daily.   levETIRAcetam 1000 MG tablet Commonly known as:  KEPPRA Take 1 tablet (1,000 mg total) by mouth 2 (two) times daily.   methocarbamol 750 MG tablet Commonly known as:  ROBAXIN Take 750 mg by mouth 3 (three) times daily.   metoprolol 100 MG tablet Commonly known as:  LOPRESSOR Take 1 tablet (100 mg total) by mouth 2 (two) times daily.   QUEtiapine 100 MG tablet Commonly known as:  SEROQUEL Take 300 mg by mouth at bedtime.        Known medication allergies: Allergies  Allergen Reactions  . Cymbalta [Duloxetine Hcl] Other (See Comments)    Mood changes...mean  . Paxil [Paroxetine Hcl] Other (See Comments)    Mean and aggressive Mean and aggressive  . Wellbutrin [Bupropion] Other (See Comments)    Rapid heartrate  . Zoloft [Sertraline] Other (See Comments)    Anger Anger  . Nyquil Multi-Symptom [Pseudoeph-Doxylamine-Dm-Apap] Hives  . Tramadol Itching     Physical examination: Blood pressure 100/60, pulse (!) 56, temperature 98.3 F (36.8 C), temperature source Oral, resp. rate 16, height 5' 1.5" (1.562 m), SpO2 94 %.  General: Alert, interactive, in no acute distress. HEENT: TMs pearly gray, turbinates moderately edematous with clear discharge, post-pharynx non erythematous. Neck: Supple without lymphadenopathy. Lungs: Mildly decreased breath sounds with expiratory wheezing bilaterally. {no increased work of breathing. CV: Normal S1, S2 without murmurs. Abdomen: Nondistended, nontender. Skin: Warm and dry, without lesions or rashes. Extremities:  No clubbing, cyanosis  or edema. Neuro:   Grossly intact.  Diagnositics/Labs:  Spirometry: FEV1: 0.57L  24%, FVC: 1.11L  38%, ratio consistent with Obstructive with restrictive pattern.   patient reports she is still somewhat weak which may be contributing to reduced lung function however she has poor lung function at baseline. Offered to provide patient with a DuoNeb in the office which she declined  Assessment and plan:   Severe persistent asthma with co-existing COPD  - Resume use of Dulera 200 two puffs twice daily      (will provide with Dulera sample 139mcg take 3 puffs twice a day until you are able to fill your prescriptions)  - Use Spiriva 1.69mcg 2 puffs daily at noon  - Want you to resume monthly Nucala injections. Patient does report noticing an improvement in her breathing when on Nucala.  - use Proair 2 puffs or duoneb 1 vial ever 4-6 hours as needed for cough, wheeze, shortness of breath or chest tightness.  - If she continues to struggle will add back Flovent to  her regimen   Chronic rhinitis - Continue with nasal saline and Astelin as well as nasal Atrovent 1-2 sprays per nostril every 6 hours as needed for runny nose. - Continue Claritin 10mg  daily as needed.    Will send in for Ryvent antihistamine (provided with sample)--take first dose in evening to see if will make you drowsy.    - Provided with a sample of Pazeo for as needed use for itchy or watery eyes   Return to clinic in 2 months     I appreciate the opportunity to take part in Dagny's care. Please do not hesitate to contact me with questions.  Sincerely,   Prudy Feeler, MD Allergy/Immunology [facility] [place] [facility] [place]

## 2017-04-03 NOTE — Patient Instructions (Signed)
Severe persistent asthma with co-existing COPD  - Resume use of Dulera 200 two puffs twice daily      (will provide with Dulera sample 193mcg take 3 puffs twice a day until you are able to fill your prescriptions)  - Use Spiriva 1.38mcg 2 puffs daily at noon  - Want you to resume monthly Nucala injections.   - use Proair 2 puffs or duoneb 1 vial ever 4-6 hours as needed for cough, wheeze, shortness of breath or chest tightness.    Chronic rhinitis - Continue with nasal saline and Astelin as well as nasal Atrovent 1-2 sprays per nostril every 6 hours as needed for runny nose. - Continue Claritin 10mg  daily as needed.    Will send in for Ryvent antihistamine (provided with sample)--take first dose in evening to see if will make you drowsy.      Return to clinic in 2 months

## 2017-04-06 ENCOUNTER — Other Ambulatory Visit: Payer: Self-pay

## 2017-04-06 DIAGNOSIS — J4550 Severe persistent asthma, uncomplicated: Secondary | ICD-10-CM

## 2017-04-06 MED ORDER — IPRATROPIUM-ALBUTEROL 0.5-2.5 (3) MG/3ML IN SOLN: 3.0000 mL | Freq: Four times a day (QID) | RESPIRATORY_TRACT | 1 refills | Status: DC | PRN

## 2017-04-27 NOTE — Addendum Note (Signed)
Addendum  created 04/27/17 1054 by Oleta Mouse, MD   Sign clinical note

## 2017-05-05 ENCOUNTER — Telehealth: Payer: Self-pay | Admitting: Allergy and Immunology

## 2017-05-05 NOTE — Telephone Encounter (Signed)
Pt called in stating that it hurts to breath and talk. SHe has a sever sore throat and some dizzy spells. She has done her all her inhalers. Pt stated it started 3 or 4 days ago. Was able to get her on schedule for tomorrow 05/06/2017 at 9am

## 2017-05-05 NOTE — Telephone Encounter (Signed)
Lm for pt to call us back need symptoms and date of onset

## 2017-05-05 NOTE — Telephone Encounter (Signed)
Patient called and said she thinks she has bronchitis and would like to know if a prescription could be called in to [facility] [address].

## 2017-05-06 ENCOUNTER — Ambulatory Visit: Payer: Self-pay | Admitting: Allergy and Immunology

## 2017-05-16 ENCOUNTER — Encounter (HOSPITAL_BASED_OUTPATIENT_CLINIC_OR_DEPARTMENT_OTHER): Payer: Self-pay | Admitting: Emergency Medicine

## 2017-05-16 ENCOUNTER — Emergency Department (HOSPITAL_BASED_OUTPATIENT_CLINIC_OR_DEPARTMENT_OTHER)
Admission: EM | Admit: 2017-05-16 | Discharge: 2017-05-16 | Disposition: A | Discharge status: Home / Self Care | Payer: Medicaid Other | Attending: Emergency Medicine | Admitting: Emergency Medicine

## 2017-05-16 ENCOUNTER — Emergency Department (HOSPITAL_BASED_OUTPATIENT_CLINIC_OR_DEPARTMENT_OTHER): Payer: Medicaid Other

## 2017-05-16 DIAGNOSIS — J45909 Unspecified asthma, uncomplicated: Secondary | ICD-10-CM | POA: Insufficient documentation

## 2017-05-16 DIAGNOSIS — I10 Essential (primary) hypertension: Secondary | ICD-10-CM | POA: Diagnosis not present

## 2017-05-16 DIAGNOSIS — J441 Chronic obstructive pulmonary disease with (acute) exacerbation: Secondary | ICD-10-CM | POA: Insufficient documentation

## 2017-05-16 DIAGNOSIS — F17210 Nicotine dependence, cigarettes, uncomplicated: Secondary | ICD-10-CM | POA: Insufficient documentation

## 2017-05-16 DIAGNOSIS — J449 Chronic obstructive pulmonary disease, unspecified: Secondary | ICD-10-CM | POA: Insufficient documentation

## 2017-05-16 DIAGNOSIS — R0602 Shortness of breath: Secondary | ICD-10-CM | POA: Diagnosis present

## 2017-05-16 DIAGNOSIS — Z79899 Other long term (current) drug therapy: Secondary | ICD-10-CM | POA: Insufficient documentation

## 2017-05-16 LAB — CBC WITH DIFFERENTIAL/PLATELET
BASOS PCT: 0 %
Basophils Absolute: 0.1 10*3/uL (ref 0.0–0.1)
Eosinophils Absolute: 0.2 10*3/uL (ref 0.0–0.7)
Eosinophils Relative: 1 %
HEMATOCRIT: 43.2 % (ref 36.0–46.0)
HEMOGLOBIN: 14.8 g/dL (ref 12.0–15.0)
LYMPHS ABS: 2.1 10*3/uL (ref 0.7–4.0)
LYMPHS PCT: 11 %
MCH: 30.5 pg (ref 26.0–34.0)
MCHC: 34.3 g/dL (ref 30.0–36.0)
MCV: 88.9 fL (ref 78.0–100.0)
Monocytes Absolute: 1.5 10*3/uL — ABNORMAL HIGH (ref 0.1–1.0)
Monocytes Relative: 8 %
NEUTROS ABS: 14.9 10*3/uL — AB (ref 1.7–7.7)
NEUTROS PCT: 80 %
Platelets: 254 10*3/uL (ref 150–400)
RBC: 4.86 MIL/uL (ref 3.87–5.11)
RDW: 13.8 % (ref 11.5–15.5)
WBC: 18.6 10*3/uL — ABNORMAL HIGH (ref 4.0–10.5)

## 2017-05-16 LAB — COMPREHENSIVE METABOLIC PANEL
ALBUMIN: 4.2 g/dL (ref 3.5–5.0)
ALT: 7 U/L — ABNORMAL LOW (ref 14–54)
ANION GAP: 12 (ref 5–15)
AST: 18 U/L (ref 15–41)
Alkaline Phosphatase: 108 U/L (ref 38–126)
BUN: 18 mg/dL (ref 6–20)
CHLORIDE: 101 mmol/L (ref 101–111)
CO2: 23 mmol/L (ref 22–32)
Calcium: 9.6 mg/dL (ref 8.9–10.3)
Creatinine, Ser: 1.05 mg/dL — ABNORMAL HIGH (ref 0.44–1.00)
GFR calc Af Amer: >60 mL/min (ref >60)
GFR calc non Af Amer: 60 mL/min — ABNORMAL LOW (ref >60)
GLUCOSE: 97 mg/dL (ref 65–99)
POTASSIUM: 5 mmol/L (ref 3.5–5.1)
SODIUM: 136 mmol/L (ref 135–145)
Total Bilirubin: 0.7 mg/dL (ref 0.3–1.2)
Total Protein: 7.1 g/dL (ref 6.5–8.1)

## 2017-05-16 LAB — TROPONIN I: Troponin I: <0.03 ng/mL (ref <0.03)

## 2017-05-16 MED ORDER — PREDNISONE 20 MG PO TABS: 40.0000 mg | ORAL_TABLET | Freq: Every day | ORAL | 0 refills | Status: DC

## 2017-05-16 MED ORDER — IPRATROPIUM-ALBUTEROL 0.5-2.5 (3) MG/3ML IN SOLN
3.0000 mL | Freq: Four times a day (QID) | RESPIRATORY_TRACT | Status: DC
  Administered 2017-05-16: 3 mL via RESPIRATORY_TRACT

## 2017-05-16 MED ORDER — ALBUTEROL SULFATE (2.5 MG/3ML) 0.083% IN NEBU: 5.0000 mg | INHALATION_SOLUTION | Freq: Once | RESPIRATORY_TRACT | Status: DC

## 2017-05-16 MED ORDER — IPRATROPIUM-ALBUTEROL 0.5-2.5 (3) MG/3ML IN SOLN
RESPIRATORY_TRACT | Status: AC
  Filled 2017-05-16: qty 3

## 2017-05-16 MED ORDER — IPRATROPIUM-ALBUTEROL 0.5-2.5 (3) MG/3ML IN SOLN
RESPIRATORY_TRACT | Status: AC
  Administered 2017-05-16: 3 mL
  Filled 2017-05-16: qty 3

## 2017-05-16 MED ORDER — ALBUTEROL SULFATE (2.5 MG/3ML) 0.083% IN NEBU
INHALATION_SOLUTION | RESPIRATORY_TRACT | Status: AC
  Filled 2017-05-16: qty 3

## 2017-05-16 MED ORDER — DOXYCYCLINE HYCLATE 100 MG PO CAPS: 100.0000 mg | ORAL_CAPSULE | Freq: Two times a day (BID) | ORAL | 0 refills | Status: DC

## 2017-05-16 MED ORDER — ALBUTEROL SULFATE (2.5 MG/3ML) 0.083% IN NEBU
INHALATION_SOLUTION | RESPIRATORY_TRACT | Status: AC
  Administered 2017-05-16: 2.5 mg
  Filled 2017-05-16: qty 3

## 2017-05-16 MED ORDER — ALBUTEROL SULFATE (2.5 MG/3ML) 0.083% IN NEBU
2.5000 mg | INHALATION_SOLUTION | Freq: Once | RESPIRATORY_TRACT | Status: AC
  Administered 2017-05-16: 2.5 mg via RESPIRATORY_TRACT

## 2017-05-16 NOTE — ED Notes (Signed)
Patient left stating that she needs to take her med because she is starting to be anxious.  Pt is not able to tell me as to what med she she taking.

## 2017-05-16 NOTE — ED Notes (Signed)
ED Provider at bedside. 

## 2017-05-16 NOTE — Discharge Instructions (Signed)
You are leaving against our advice this time. Feel free to return further treatment. Follow-up with Dr. soon as possible.

## 2017-05-16 NOTE — ED Notes (Signed)
Pt ambulatory to and from BR unassisted, in NAD.

## 2017-05-16 NOTE — ED Triage Notes (Signed)
Pt c/o SOB with increasing severity for past 3 days.  Insp and Exp wheezing ad productive cough with green sputum.

## 2017-05-16 NOTE — ED Provider Notes (Signed)
[facility] DEPT [facility] Provider Note   CSN: [id number] Arrival date & time: 05/16/17  1413 By signing my name below, I, Dyke Brackett, attest that this documentation has been prepared under the direction and in the presence of Davonna Belling, MD . Electronically Signed: Dyke Brackett, Scribe. 05/16/2017. 3:40 PM.   History   Chief Complaint Chief Complaint  Patient presents with  . Shortness of Breath   HPI [name] is a 51 y.o. female with a history of CVA, COPD, and asthma who presents to the Emergency Department complaining of progressively worsening shortness of breath onset three days ago. She reports associated dizziness, headache, nausea, chest pain, sore throat,  back pain, difficulty urinating, and cough productive of green sputum. She was given breathing treatment upon arrival to the ED with no relief of symptoms. Pt states she typically has pneumonia 3 times per year. She denies any fever, diarrhea, abdominal pain, dysuria, hematuria or rashes.   The history is provided by the patient. No language interpreter was used.   Past Medical History:  Diagnosis Date  . Allergic rhinitis   . Arthritis   . Asthma   . Back pain   . Bipolar 1 disorder ([place])   . C. difficile diarrhea   . Cavitary lesion of lung   . Chronic low back pain 03/06/2014  . COPD (chronic obstructive pulmonary disease) ([place])   . Depression   . Fibroid   . Hypertension   . Hypomagnesemia   . IBS (irritable bowel syndrome)   . Migraine   . Scoliosis     Patient Active Problem List   Diagnosis Date Noted  . CVA (cerebral vascular accident) ([place]) 02/07/2017  . Acute CVA (cerebrovascular accident) ([place]) 02/06/2017  . Chest pain 02/06/2017  . Palliative care by specialist   . Goals of care, counseling/discussion   . Acute renal failure ([place])   . Bipolar I disorder ([place])   . Chronic pain syndrome   . PRES (posterior reversible encephalopathy syndrome)   . Acute encephalopathy   . Visual  disturbance   . Seizure ([place]) 01/15/2017  . Hypokalemia 01/15/2017  . Renal insufficiency 01/15/2017  . Hyperglycemia 01/15/2017  . H/O chest tube placement   . Candida infection   . Coagulase-negative staphylococcal infection   . Asthma 01/03/2017  . Seasonal allergies 01/03/2017  . Anemia 01/03/2017  . Herpes labialis 01/03/2017  . Empyema of pleural space ([place]) 12/26/2016  . Abscess of lower lobe of left lung with pneumonia ([place])   . Ileus ([place])   . Pneumothorax   . Pneumonia of left upper lobe due to Streptococcus pneumoniae ([place])   . Clostridium difficile colitis   . Acute kidney injury ([place])   . HCAP (healthcare-associated pneumonia)   . Transaminitis   . Pressure injury of skin 12/19/2016  . 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 Acute respiratory failure ([place]) 12/13/2015  . IBS (irritable bowel syndrome) 12/13/2015  . COPD with acute exacerbation ([place])   . Essential hypertension   . Encephalopathy acute 12/03/2015  . Chronic narcotic dependence ([place]) 11/22/2015  . Hypercalcemia 11/22/2015  . Chronic hyponatremia 03/07/2015  . Cigarette nicotine dependence without complication 40/98/1191  . Fibromyalgia syndrome 01/09/2015  . Mixed hyperlipidemia 01/09/2015  . Chronic low back pain 03/06/2014    Past Surgical History:  Procedure Laterality Date  . CHOLECYSTECTOMY    . DILATION AND CURETTAGE OF UTERUS    . EXPLORATORY LAPAROTOMY     WHEN  PATIENT WAS YOUNG  . RIGHT FOOT SURGERY    . TUBAL LIGATION Bilateral   . VIDEO ASSISTED THORACOSCOPY (VATS)/DECORTICATION Left 12/26/2016   Procedure: LEFT VIDEO ASSISTED THORACOSCOPY (VATS)/DECORTICATION;  Surgeon: Ivin Poot, MD;  Location: [facility];  Service: Thoracic;  Laterality: Left;  Marland Kitchen VIDEO BRONCHOSCOPY N/A 12/26/2016   Procedure: VIDEO BRONCHOSCOPY WITH BRONCHIAL LAVAGE;  Surgeon: Ivin Poot, MD;  Location: [facility] OR;  Service: Thoracic;  Laterality: N/A;    OB  History    No data available       Home Medications    Prior to Admission medications   Medication Sig Start Date End Date Taking? Authorizing Provider  albuterol (PROAIR HFA) 108 (90 Base) MCG/ACT inhaler Inhale 2 puffs into the lungs every 6 (six) hours as needed for wheezing or shortness of breath. 04/03/17  Yes Padgett, Rae Halsted, MD  amLODipine (NORVASC) 5 MG tablet Take 1 tablet (5 mg total) by mouth daily. 02/11/17  Yes Velvet Bathe, MD  budesonide (PULMICORT) 0.5 MG/2ML nebulizer solution Take 2 mLs (0.5 mg total) by nebulization 2 (two) times daily. 01/12/17  Yes Theodis Blaze, MD  Carbinoxamine Maleate (RYVENT) 6 MG TABS Take 1 tablet by mouth 3 (three) times daily. 04/03/17  Yes Padgett, Rae Halsted, MD  escitalopram (LEXAPRO) 20 MG tablet Take 2 tablets (40 mg total) by mouth daily. 02/10/17  Yes Velvet Bathe, MD  gabapentin (NEURONTIN) 400 MG capsule Take 1 capsule (400 mg total) by mouth 3 (three) times daily. 02/10/17  Yes Velvet Bathe, MD  hydrALAZINE (APRESOLINE) 100 MG tablet Take 1 tablet (100 mg total) by mouth every 6 (six) hours. 02/10/17  Yes Velvet Bathe, MD  ipratropium-albuterol (DUONEB) 0.5-2.5 (3) MG/3ML SOLN Take 3 mLs by nebulization every 6 (six) hours as needed. 04/06/17  Yes Padgett, Rae Halsted, MD  lamoTRIgine (LAMICTAL) 100 MG tablet Take 1 tablet (100 mg total) by mouth 2 (two) times daily. 02/10/17  Yes Velvet Bathe, MD  metoprolol (LOPRESSOR) 100 MG tablet Take 1 tablet (100 mg total) by mouth 2 (two) times daily. 02/10/17  Yes Velvet Bathe, MD  mometasone-formoterol [facility]) 200-5 MCG/ACT AERO Inhale 2 puffs into the lungs 2 (two) times daily. 04/03/17  Yes Padgett, Rae Halsted, MD  Tiotropium Bromide Monohydrate (SPIRIVA RESPIMAT) 1.25 MCG/ACT AERS Inhale 2 puffs into the lungs daily. 04/03/17  Yes Padgett, Rae Halsted, MD  acetaminophen (TYLENOL) 325 MG tablet Take 2 tablets (650 mg total) by mouth every 4 (four) hours as needed  for mild pain (or temp > 37.5 C (99.5 F)). 02/10/17   Velvet Bathe, MD  doxycycline (VIBRAMYCIN) 100 MG capsule Take 1 capsule (100 mg total) by mouth 2 (two) times daily. 05/16/17   Davonna Belling, MD  levETIRAcetam (KEPPRA) 1000 MG tablet Take 1 tablet (1,000 mg total) by mouth 2 (two) times daily. 02/10/17   Velvet Bathe, MD  methocarbamol (ROBAXIN) 750 MG tablet Take 750 mg by mouth 3 (three) times daily.    [provider]  predniSONE (DELTASONE) 20 MG tablet Take 2 tablets (40 mg total) by mouth daily. 05/16/17   Davonna Belling, MD  QUEtiapine (SEROQUEL) 100 MG tablet Take 300 mg by mouth at bedtime.     [provider]    Family History Family History  Problem Relation Age of Onset  . Stroke Mother   . Hypertension Brother   . Hypertension Brother   . Hypertension Brother   . Allergic rhinitis Neg Hx   . Angioedema Neg Hx   .  Asthma Neg Hx   . Atopy Neg Hx   . Eczema Neg Hx   . Immunodeficiency Neg Hx   . Urticaria Neg Hx     Social History Social History  Substance Use Topics  . Smoking status: Current Every Day Smoker    Packs/day: 0.50    Types: Cigarettes  . Smokeless tobacco: Never Used  . Alcohol use No     Allergies   Cymbalta [duloxetine hcl]; Paxil [paroxetine hcl]; Wellbutrin [bupropion]; Zoloft [sertraline]; Nyquil multi-symptom [pseudoeph-doxylamine-dm-apap]; and Tramadol  Review of Systems Review of Systems  Constitutional: Negative for fever.  HENT: Positive for sore throat.   Respiratory: Positive for cough and shortness of breath.   Cardiovascular: Positive for chest pain.  Gastrointestinal: Positive for nausea. Negative for abdominal pain, diarrhea and vomiting.  Genitourinary: Negative for dysuria and hematuria.  Musculoskeletal: Positive for back pain.  Skin: Negative for rash.  Neurological: Negative for weakness.  Psychiatric/Behavioral: Negative for confusion.   Physical Exam Updated Vital Signs BP 138/70 (BP  Location: Left Arm)   Pulse 68   Temp 98.1 F (36.7 C) (Oral)   Resp 18   SpO2 92%   Physical Exam  Constitutional: She is oriented to person, place, and time. She appears well-developed and well-nourished. No distress.  HENT:  Head: Normocephalic and atraumatic.  Eyes: Conjunctivae are normal.  Cardiovascular: Normal rate.   Pulmonary/Chest: Effort normal. She has wheezes. She has rales.  Diffuse moderate wheezes; rales in bilateral bases.   Abdominal: She exhibits no distension. There is no tenderness.  Musculoskeletal: She exhibits no edema.  No peripheral edema   Neurological: She is alert and oriented to person, place, and time.  Skin: Skin is warm and dry.  Nursing note and vitals reviewed.  ED Treatments / Results  DIAGNOSTIC STUDIES:  Oxygen Saturation is 90% on RA, low by my interpretation.    COORDINATION OF CARE:  3:37 PM Discussed treatment plan with pt at bedside and pt agreed to plan.   Labs (all labs ordered are listed, but only abnormal results are displayed) Labs Reviewed  COMPREHENSIVE METABOLIC PANEL - Abnormal; Notable for the following:       Result Value   Creatinine, Ser 1.05 (*)    ALT 7 (*)    GFR calc non Af Amer 60 (*)    All other components within normal limits  CBC WITH DIFFERENTIAL/PLATELET - Abnormal; Notable for the following:    WBC 18.6 (*)    Neutro Abs 14.9 (*)    Monocytes Absolute 1.5 (*)    All other components within normal limits  TROPONIN I    EKG  EKG Interpretation  Date/Time:  Saturday May 16 2017 14:21:13 EDT Ventricular Rate:  65 PR Interval:  138 QRS Duration: 76 QT Interval:  418 QTC Calculation: 434 R Axis:   63 Text Interpretation:  Normal sinus rhythm Normal ECG Confirmed by Alvino Chapel  MD, Ovid Curd [id number]) on 05/16/2017 3:03:02 PM       Radiology Dg Chest 2 View  Result Date: 05/16/2017 CLINICAL DATA:  Shortness of breath and productive cough for few days. History of COPD. EXAM: CHEST  2 VIEW  COMPARISON:  02/26/2017 FINDINGS: The cardiomediastinal silhouette is unchanged and within normal limits. There is minimal linear opacity in the lateral left mid lung which has decreased from the prior study and may reflect residual subsegmental atelectasis or scarring. No lobar consolidation, edema, or pneumothorax is identified. Slight blunting of the left costophrenic angle may represent pleural  thickening or at most trace pleural fluid, unchanged. Underlying chronic interstitial coarsening is again seen. Old bilateral rib fractures and right upper quadrant abdominal surgical clips are noted. IMPRESSION: No active cardiopulmonary disease. Electronically Signed   By: Logan Bores M.D.   On: 05/16/2017 15:49    Procedures Procedures (including critical care time)  Medications Ordered in ED Medications  albuterol (PROVENTIL) (2.5 MG/3ML) 0.083% nebulizer solution 5 mg (5 mg Nebulization Refused 05/16/17 1710)  albuterol (PROVENTIL) (2.5 MG/3ML) 0.083% nebulizer solution 2.5 mg (2.5 mg Nebulization Given 05/16/17 1433)  albuterol (PROVENTIL) (2.5 MG/3ML) 0.083% nebulizer solution (2.5 mg  Given 05/16/17 1547)  ipratropium-albuterol (DUONEB) 0.5-2.5 (3) MG/3ML nebulizer solution (3 mLs  Given 05/16/17 1547)     Initial Impression / Assessment and Plan / ED Course  I have reviewed the triage vital signs and the nursing notes.  Pertinent labs & imaging results that were available during my care of the patient were reviewed by me and considered in my medical decision making (see chart for details).     Patient with chest pain shortness of breath. Has cough. Likely URI on COPD. Has had some episodes of hypoxia the patient not willing to stay in the hospital. Discussed risks of doing this. Not willing to stay. Given steroids and antibiotics for home. Will follow-up with PCP as soon as possible and will return if she changes her mind.  Final Clinical Impressions(s) / ED Diagnoses   Final diagnoses:    COPD exacerbation (HCC)    New Prescriptions New Prescriptions   DOXYCYCLINE (VIBRAMYCIN) 100 MG CAPSULE    Take 1 capsule (100 mg total) by mouth 2 (two) times daily.   PREDNISONE (DELTASONE) 20 MG TABLET    Take 2 tablets (40 mg total) by mouth daily.   }I personally performed the services described in this documentation, which was scribed in my presence. The recorded information has been reviewed and is accurate.      Davonna Belling, MD 05/16/17 [id number]

## 2017-05-25 ENCOUNTER — Ambulatory Visit: Payer: Medicaid Other | Admitting: Allergy & Immunology

## 2017-07-20 ENCOUNTER — Other Ambulatory Visit: Payer: Self-pay | Admitting: Allergy

## 2017-08-18 IMAGING — CT CT HEAD W/O CM
3 of 4 series · 15 of 47 positions shown, 18 images · non-contrast
Comparison: 12/12/2016

CLINICAL DATA: Per EMS, pt from [facility], staff reports
pt reported she is not able to see. Did not report this to the
Paramedic, however, she would not open her eyes and states that she
does not feel good.

EXAM:
CT HEAD WITHOUT CONTRAST
TECHNIQUE: Contiguous axial images were obtained from the base of the skull
through the vertex without intravenous contrast.

[Series 2: head w/o · axial · non-contrast · 0.42mm/px · z∈[-236,-116]mm · 9 of 30 slices shown, 12 images]
[im 3/30  brain]
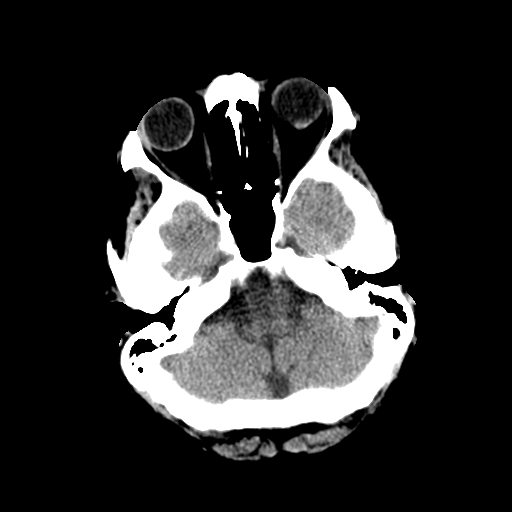
[im 3/30  bone]
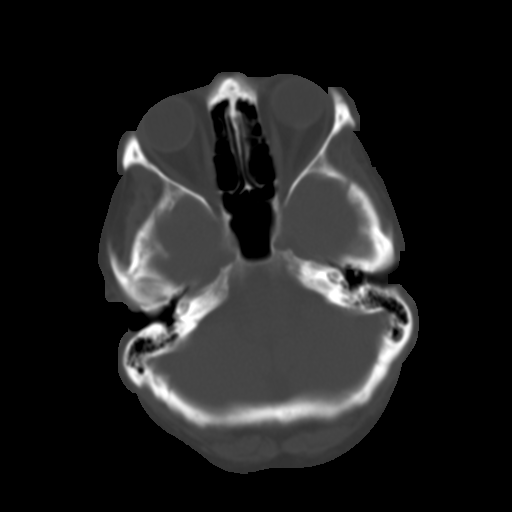
[im 7/30  brain]
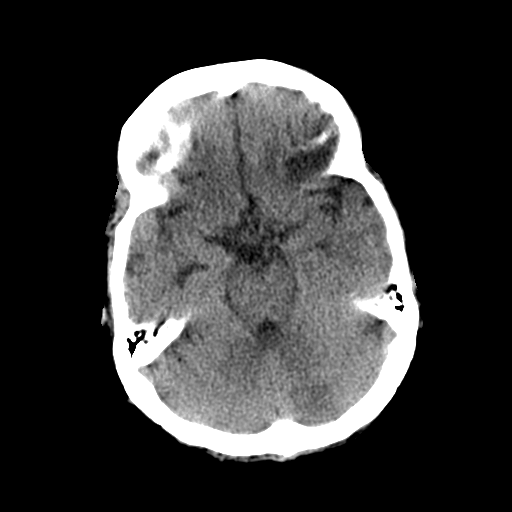
[im 9/30  brain]
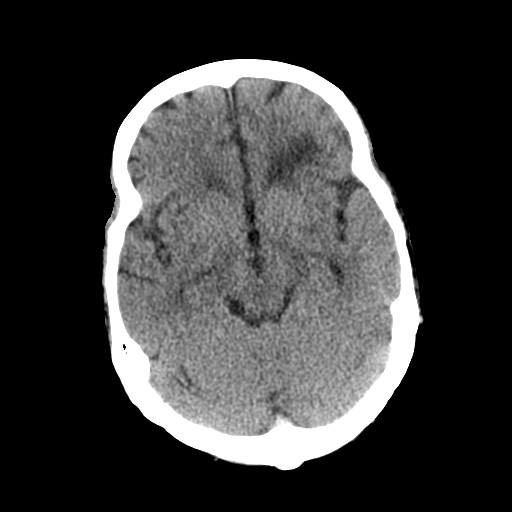
[im 13/30  brain]
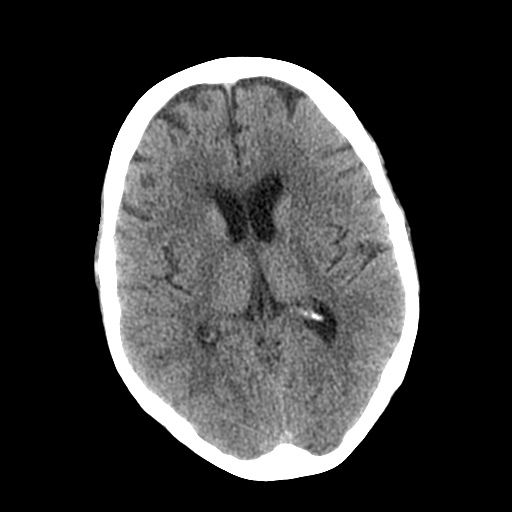
[im 15/30  brain]
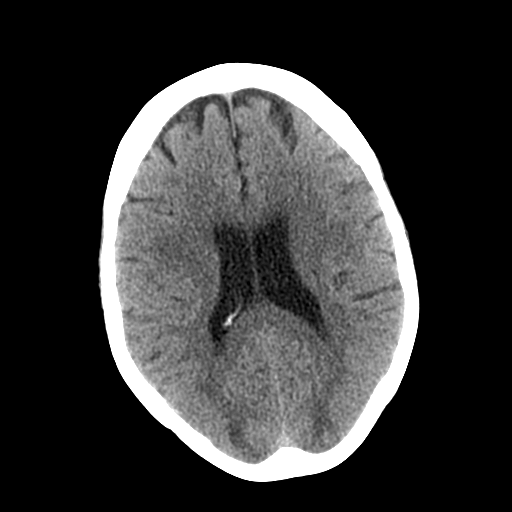
[im 15/30  bone]
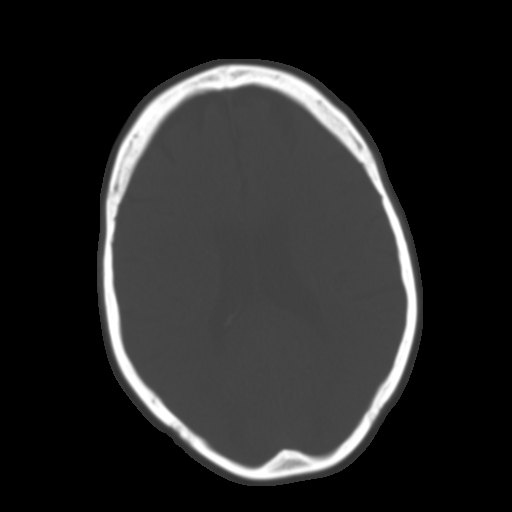
[im 17/30  brain]
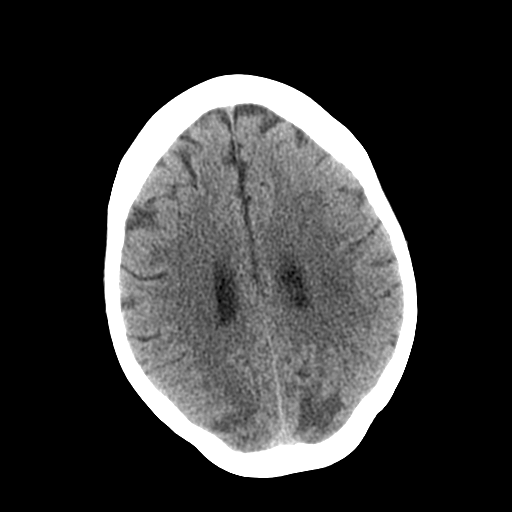
[im 21/30  brain]
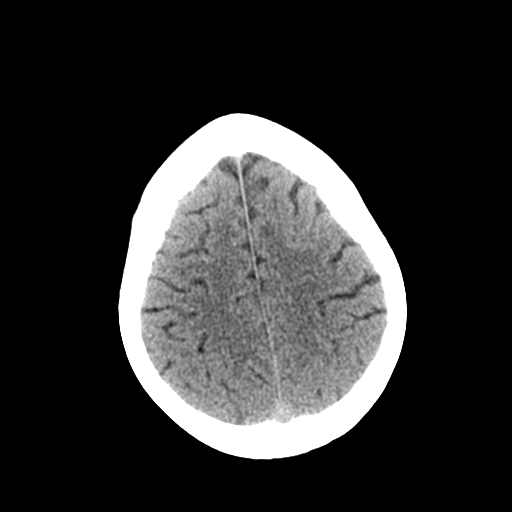
[im 23/30  brain]
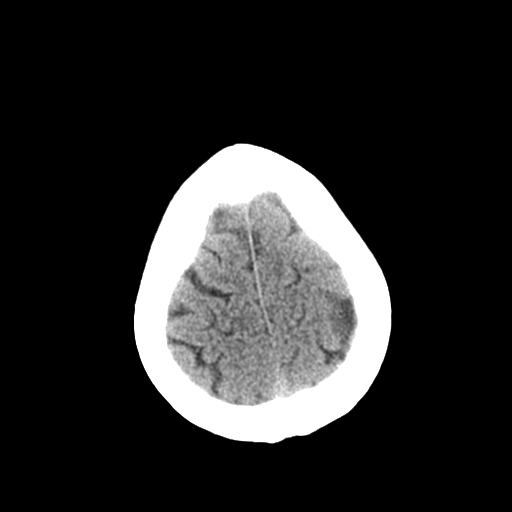
[im 27/30  brain]
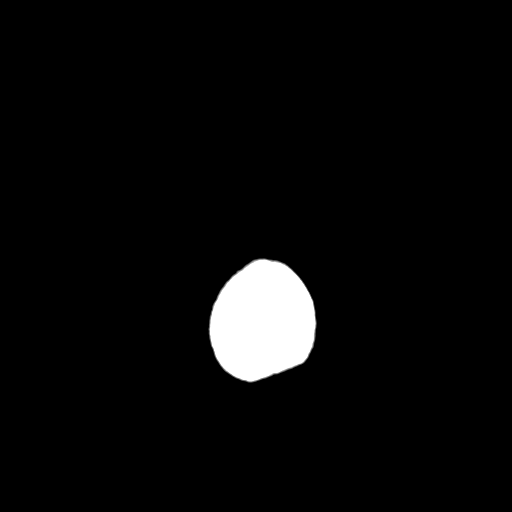
[im 27/30  bone]
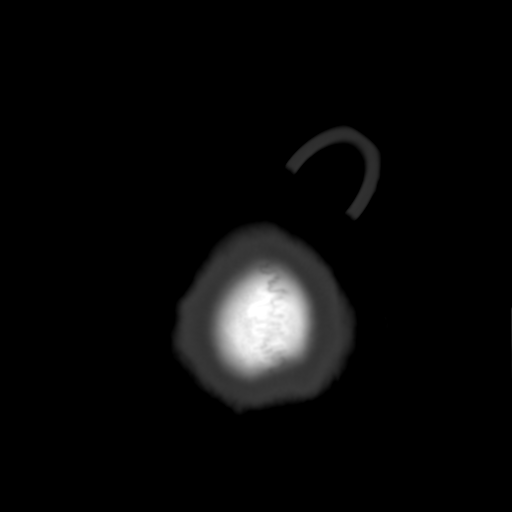

[Series 5: coronal · coronal · 0.29mm/px · 3 of 64 slices shown]
[im 22/64  brain]
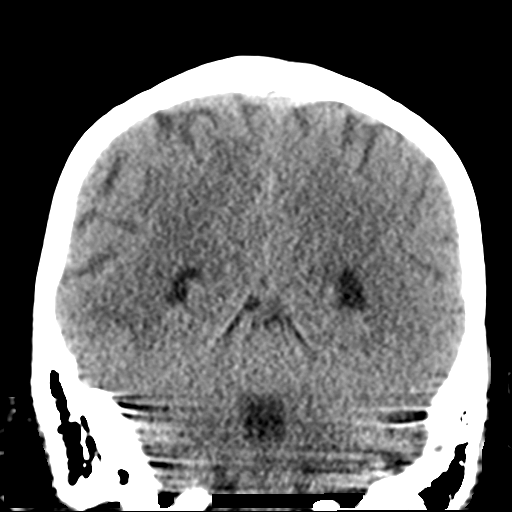
[im 29/64  brain]
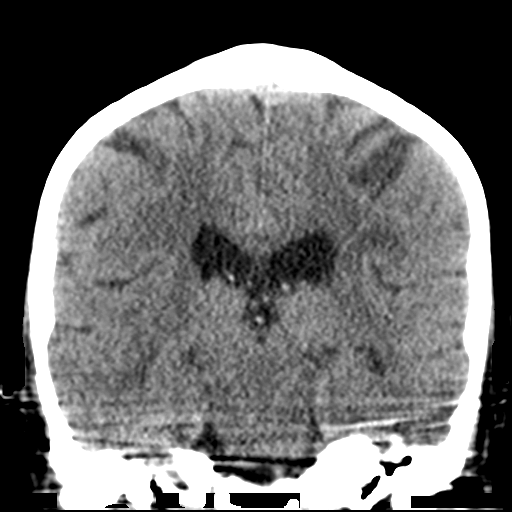
[im 36/64  brain]
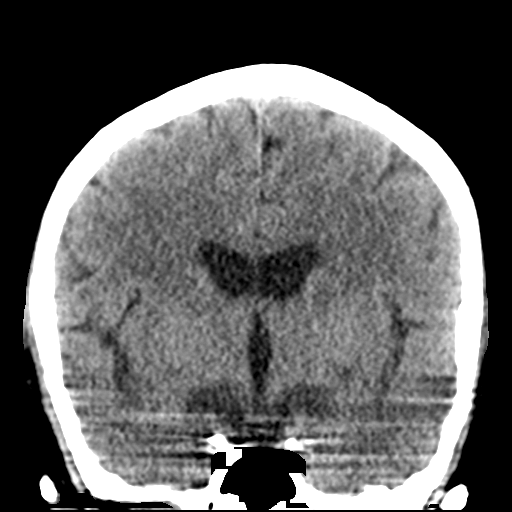

[Series 6: sagittal · sagittal · 0.27mm/px · 3 of 48 slices shown]
[im 16/48  brain]
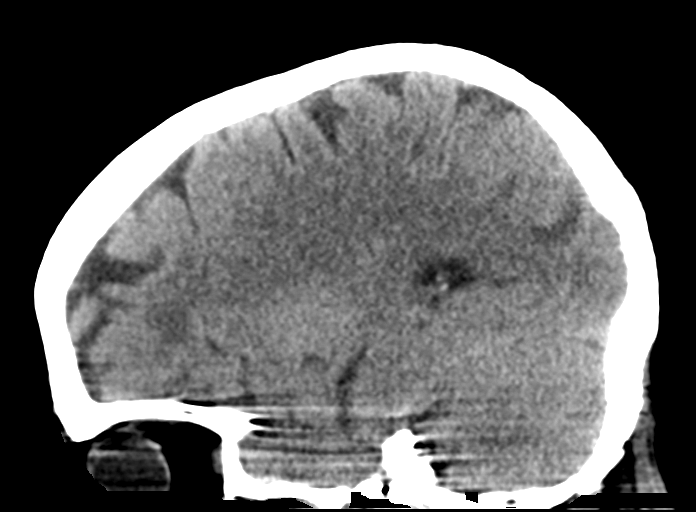
[im 24/48  brain]
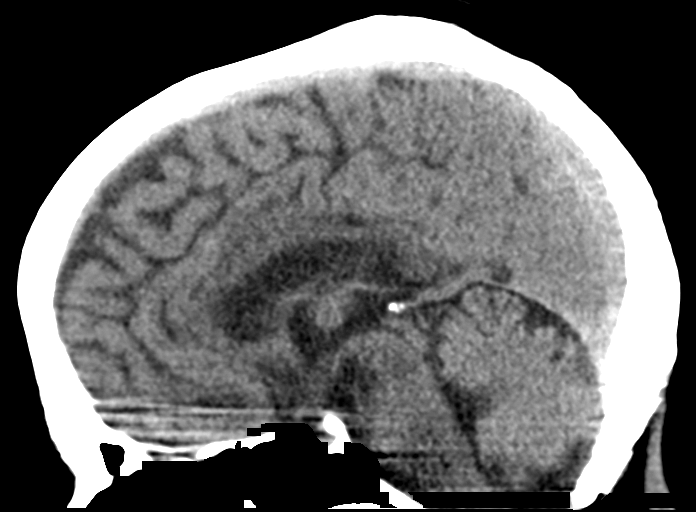
[im 32/48  brain]
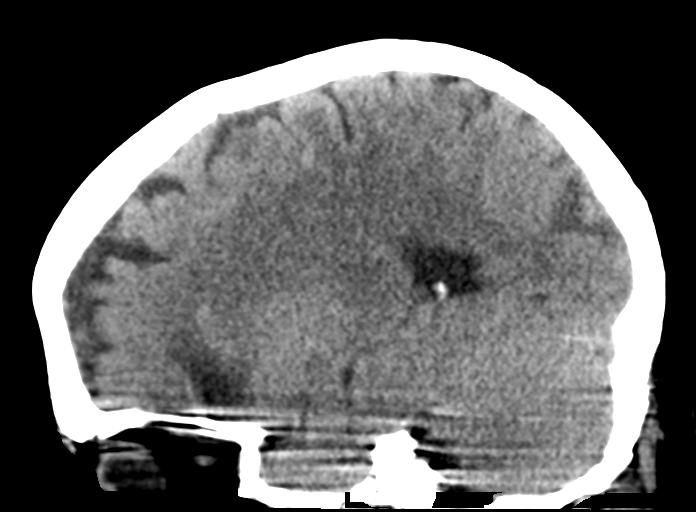

[15 of 47 positions shown; findings below may reference images not displayed]

FINDINGS: Brain: Progressive hypoattenuation posteriorly in both occipital
lobes, left greater than right. Subacute nonhemorrhagic left
cerebellar infarct. No acute intracranial hemorrhage.

Old left frontal infarct and encephalomalacia. Patchy areas of
hypoattenuation in deep and periventricular white matter
bilaterally. No midline shift. No extra-axial mass. No
hydrocephalus.

Vascular: No hyperdense vessel or unexpected calcification.

Skull: Normal. Negative for fracture or focal lesion.

Sinuses/Orbits: No acute finding.

Other: None.
IMPRESSION: 1. Progressive parenchymal hypoattenuation in left cerebellum and
bilateral posterior occipital lobes.
2. No hemorrhage or other acute finding.

## 2017-08-19 IMAGING — DX DG ABD PORTABLE 1V
1 series · 1 of 1 positions shown · non-contrast
Comparison: 12/28/2016

CLINICAL DATA: NG tube placement

EXAM:
PORTABLE ABDOMEN - 1 VIEW

[abdomen kub]
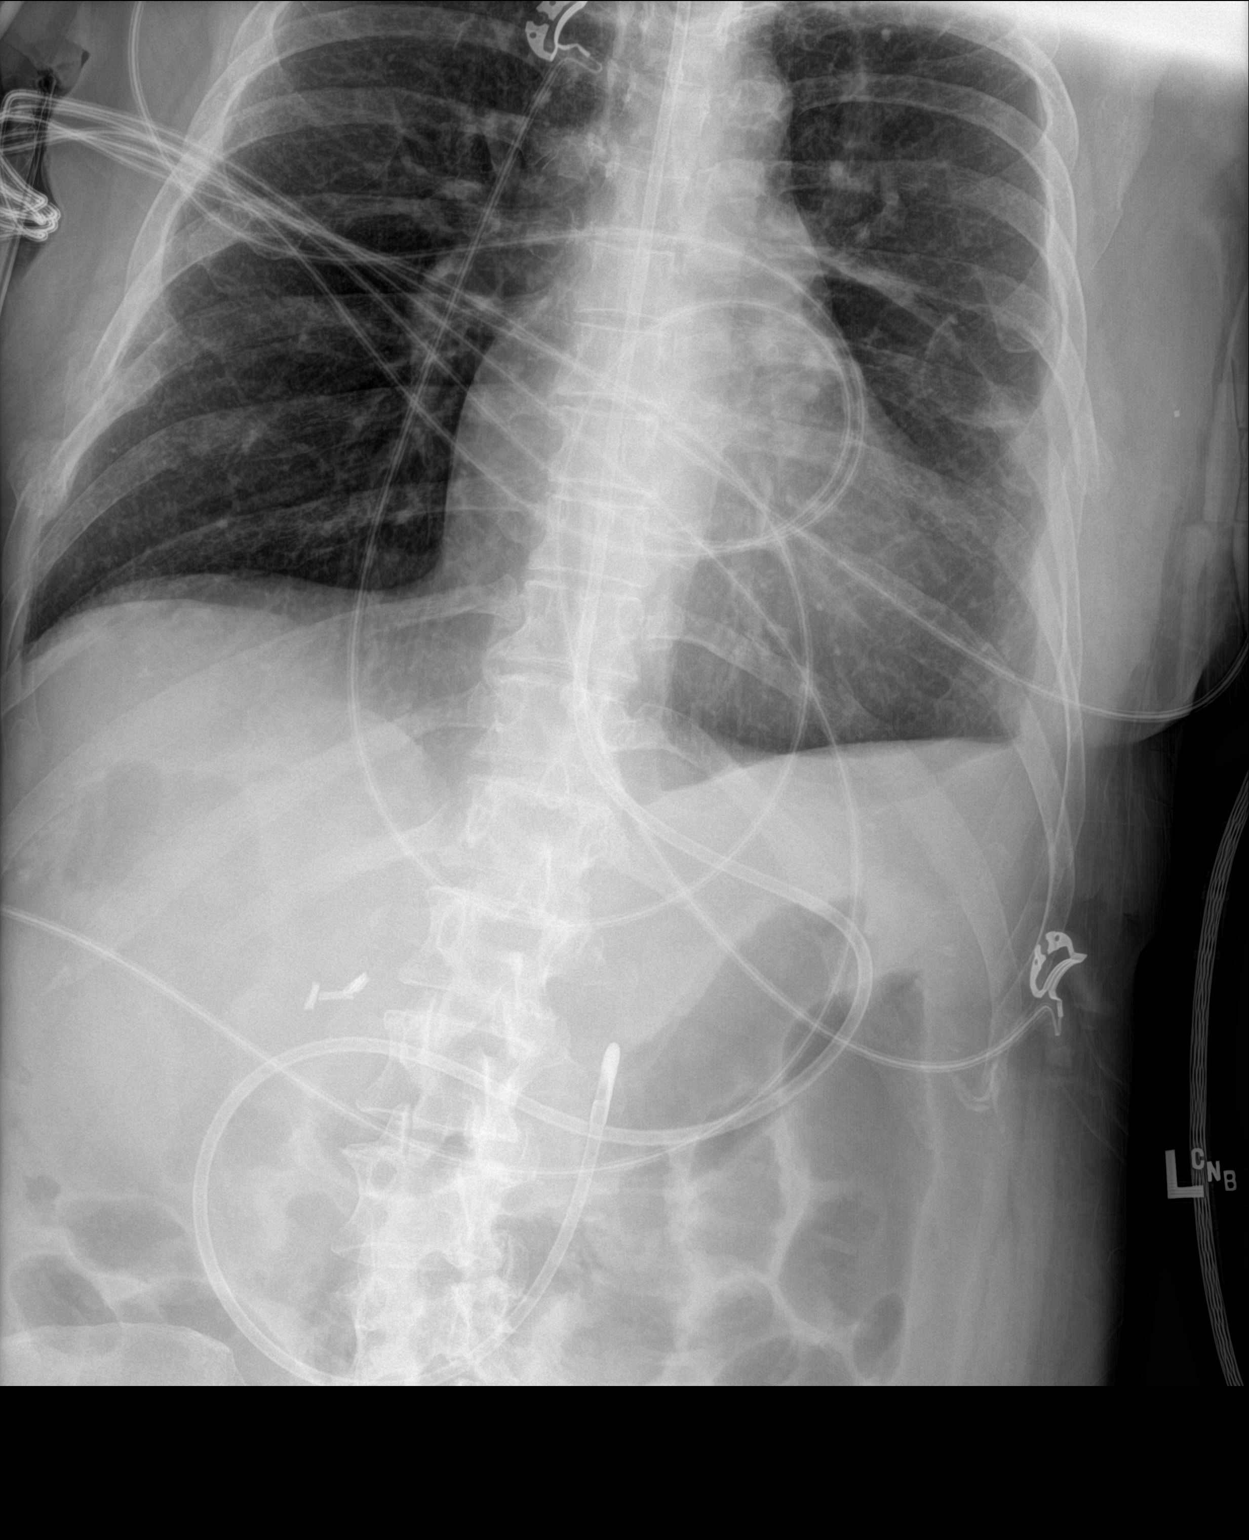

[1 of 1 positions shown; findings below may reference images not displayed]

FINDINGS: Feeding tube course of the stomach into the duodenum with the tip
near the ligament of Treitz. Normal bowel gas pattern.
IMPRESSION: Feeding tube in the duodenum near the ligament of Treitz.

## 2017-08-19 IMAGING — MR MR MRA HEAD W/O CM
1 series · 14 of 14 positions shown · non-contrast
Comparison: Head CT 01/15/2017 and MRI 12/14/2016

CLINICAL DATA: New onset visual changes and seizure activity.
Elevated blood pressure (215/127).

EXAM:
MRI HEAD WITHOUT CONTRAST
MRA HEAD WITHOUT CONTRAST
TECHNIQUE: Multiplanar, multiecho pulse sequences of the brain and surrounding
structures were obtained without intravenous contrast. Angiographic
images of the head were obtained using MRA technique without
contrast.

[Series 6: T2 · coronal · 5.0mm · 0.47mm/px · 14 of 14 slices shown]
[im 1/14]
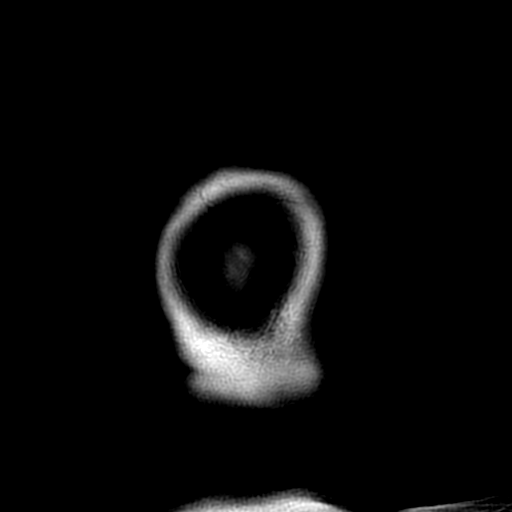
[im 2/14]
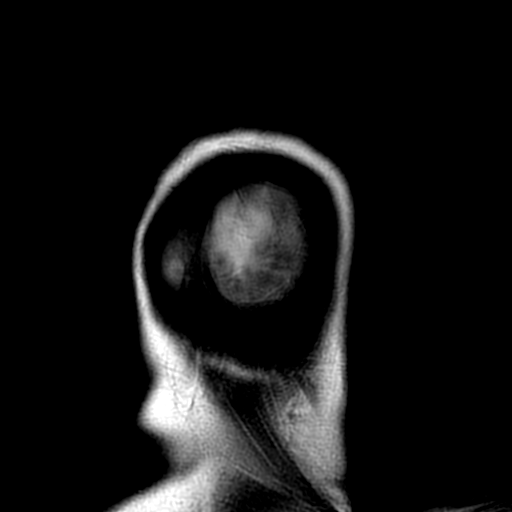
[im 3/14]
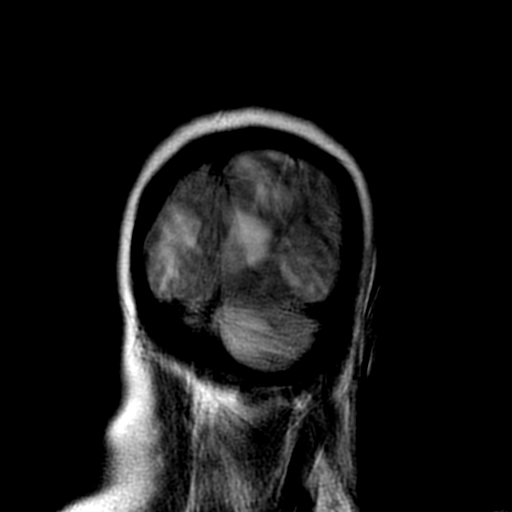
[im 4/14]
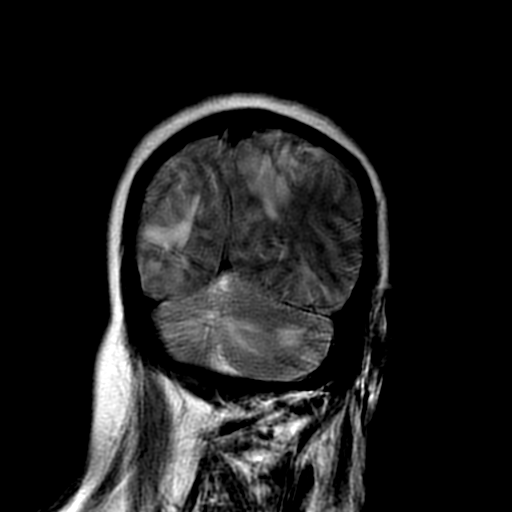
[im 5/14]
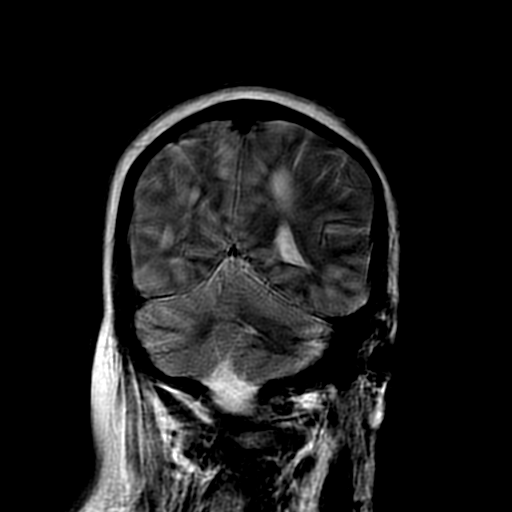
[im 6/14]
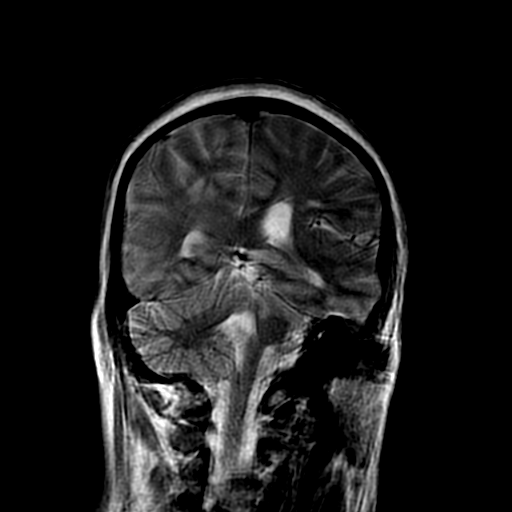
[im 7/14]
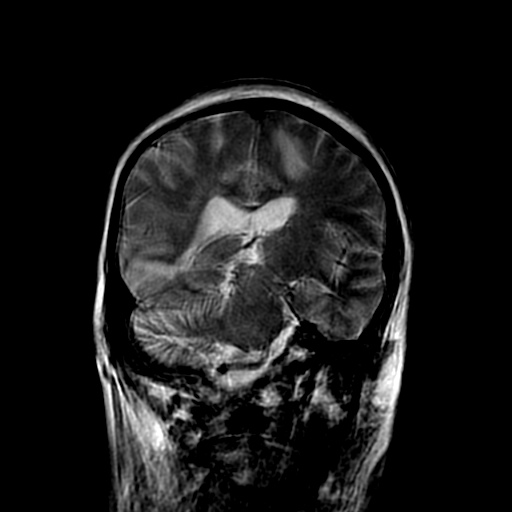
[im 8/14]
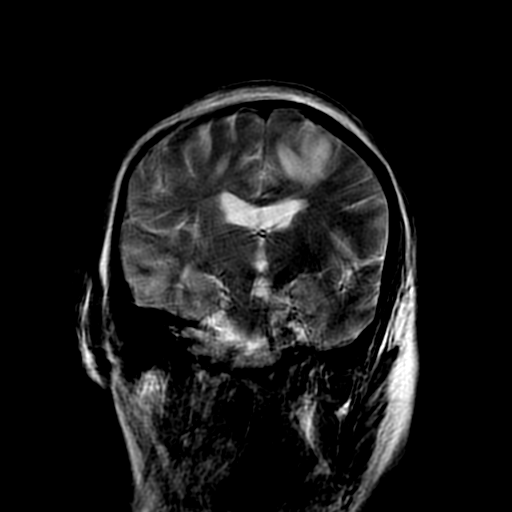
[im 9/14]
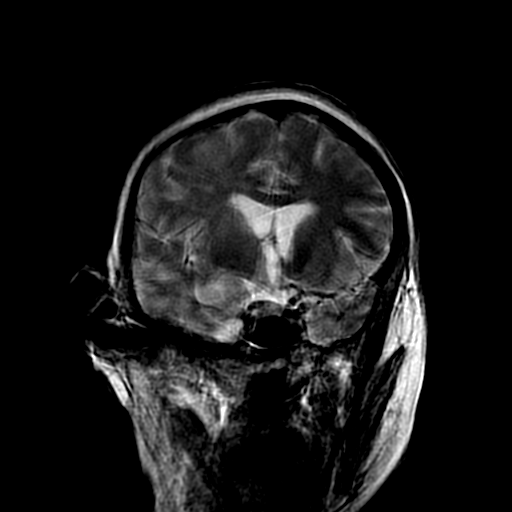
[im 10/14]
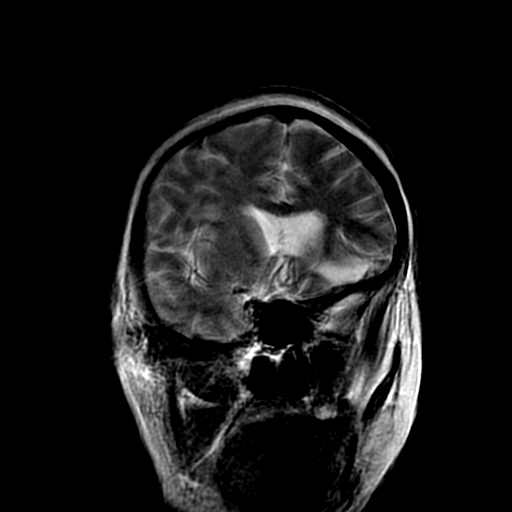
[im 11/14]
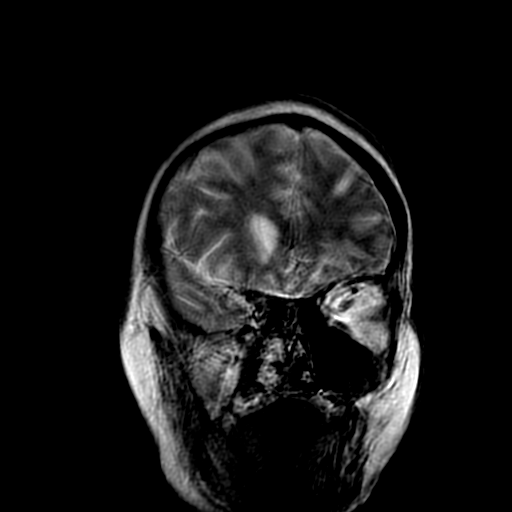
[im 12/14]
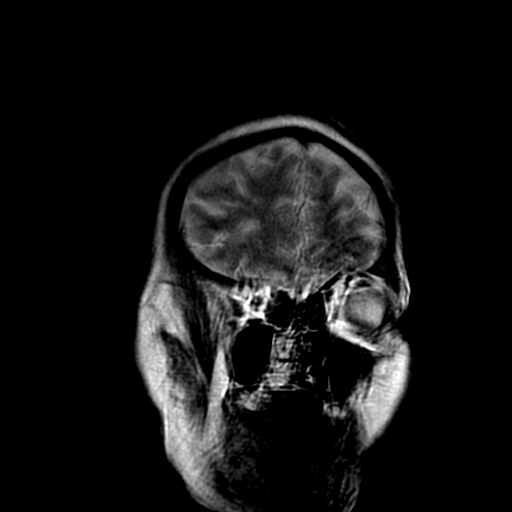
[im 13/14]
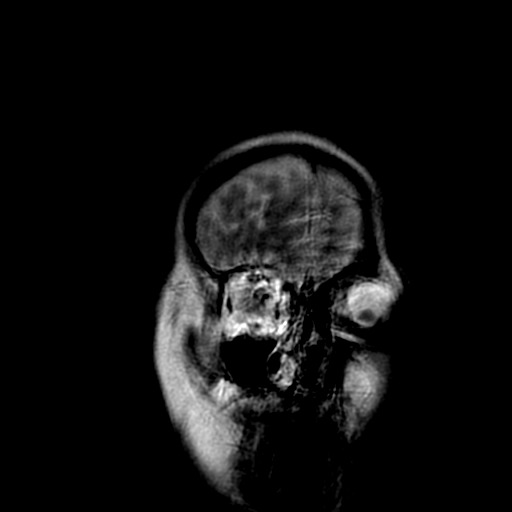
[im 14/14]
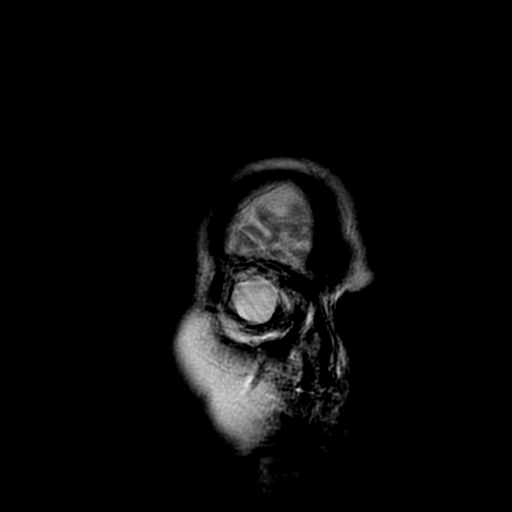

[14 of 14 positions shown; findings below may reference images not displayed]

FINDINGS: MRI HEAD FINDINGS

Multiple sequences are moderately motion degraded.

Brain: A subcentimeter focus of abnormal diffusion signal is again
seen in the left cerebellum, however there is new masslike confluent
T2 hyperintensity in the left greater than right cerebellar
hemispheres without significant posterior fossa mass effect. The
there are also multiple new regions of relatively extensive, patchy
and confluent cortical and subcortical T2 hyperintensity in both
cerebral hemispheres, greatest in the occipital and posterior
temporal lobes bilaterally as well as left parietal and high left
frontal lobes. There is milder involvement of the right parietal and
right frontal lobes. Diffusion is largely facilitated in these
regions, however there are areas of predominantly cortically based
restricted diffusion most notable in the occipital lobes. No
definite intracranial hemorrhage is identified. There is no midline
shift or extra-axial fluid collection. Ventricles are unchanged in
size. Encephalomalacia is again noted the anteroinferiorly in the
left frontal lobe and may reflect remote trauma or prior ischemic
infarction.

Vascular: Non-visualization of the distal left vertebral artery,
unchanged.

Skull and upper cervical spine: Unremarkable bone marrow signal.

Sinuses/Orbits: No gross acute abnormality.

Other: None.

MRA HEAD FINDINGS

The study is moderately motion degraded.

The visualized distal right vertebral artery is dominant and widely
patent, supplying the basilar. A left vertebral artery is not
identified and may be occluded or hypoplastic. The basilar artery is
patent without evidence of significant stenosis. Left AICA and
bilateral SCAs are visualized. There is a small posterior
communicating artery on the left. PCAs are patent without evidence
of significant proximal stenosis.

The internal carotid arteries are patent from skullbase to carotid
termini without evidence of flow limiting stenosis. A 2 x 2 mm
outpouching is present at the left posterior communicating origin.
ACAs and MCAs are patent. The left A1 segment is dominant. A
potentially high-grade stenosis is questioned at the right ACA
origin with potential moderate stenosis more distally in the right
A1 segment, however this appearance may be at least partly due to
motion artifact. There may be a moderate distal left M1 stenosis,
and proximal M2 stenoses are questioned bilaterally.
IMPRESSION: 1. New extensive regions of cortical and subcortical T2
hyperintensity in the predominantly posterior cerebral hemispheres
and left greater than right cerebellum, most consistent with
posterior reversible encephalopathy syndrome in this setting.
2. Motion degraded MRA with multiple questioned anterior circulation
stenoses versus motion artifact as above. Consider further
evaluation with CTA as an outpatient once the patient's acute
illness has resolved and he is better able to remain motionless.
3. Hypoplastic versus occluded distal left vertebral artery. Widely
patent and dominant right vertebral artery.
4. 2 mm aneurysm versus prominent infundibulum at the left posterior
communicating origin.

## 2017-08-20 IMAGING — CR DG ABD PORTABLE 1V
1 series · 1 of 1 positions shown · non-contrast
Comparison: Single-view of the abdomen 01/16/2017.

CLINICAL DATA: NG tube placement.

EXAM:
PORTABLE ABDOMEN - 1 VIEW

[AP]
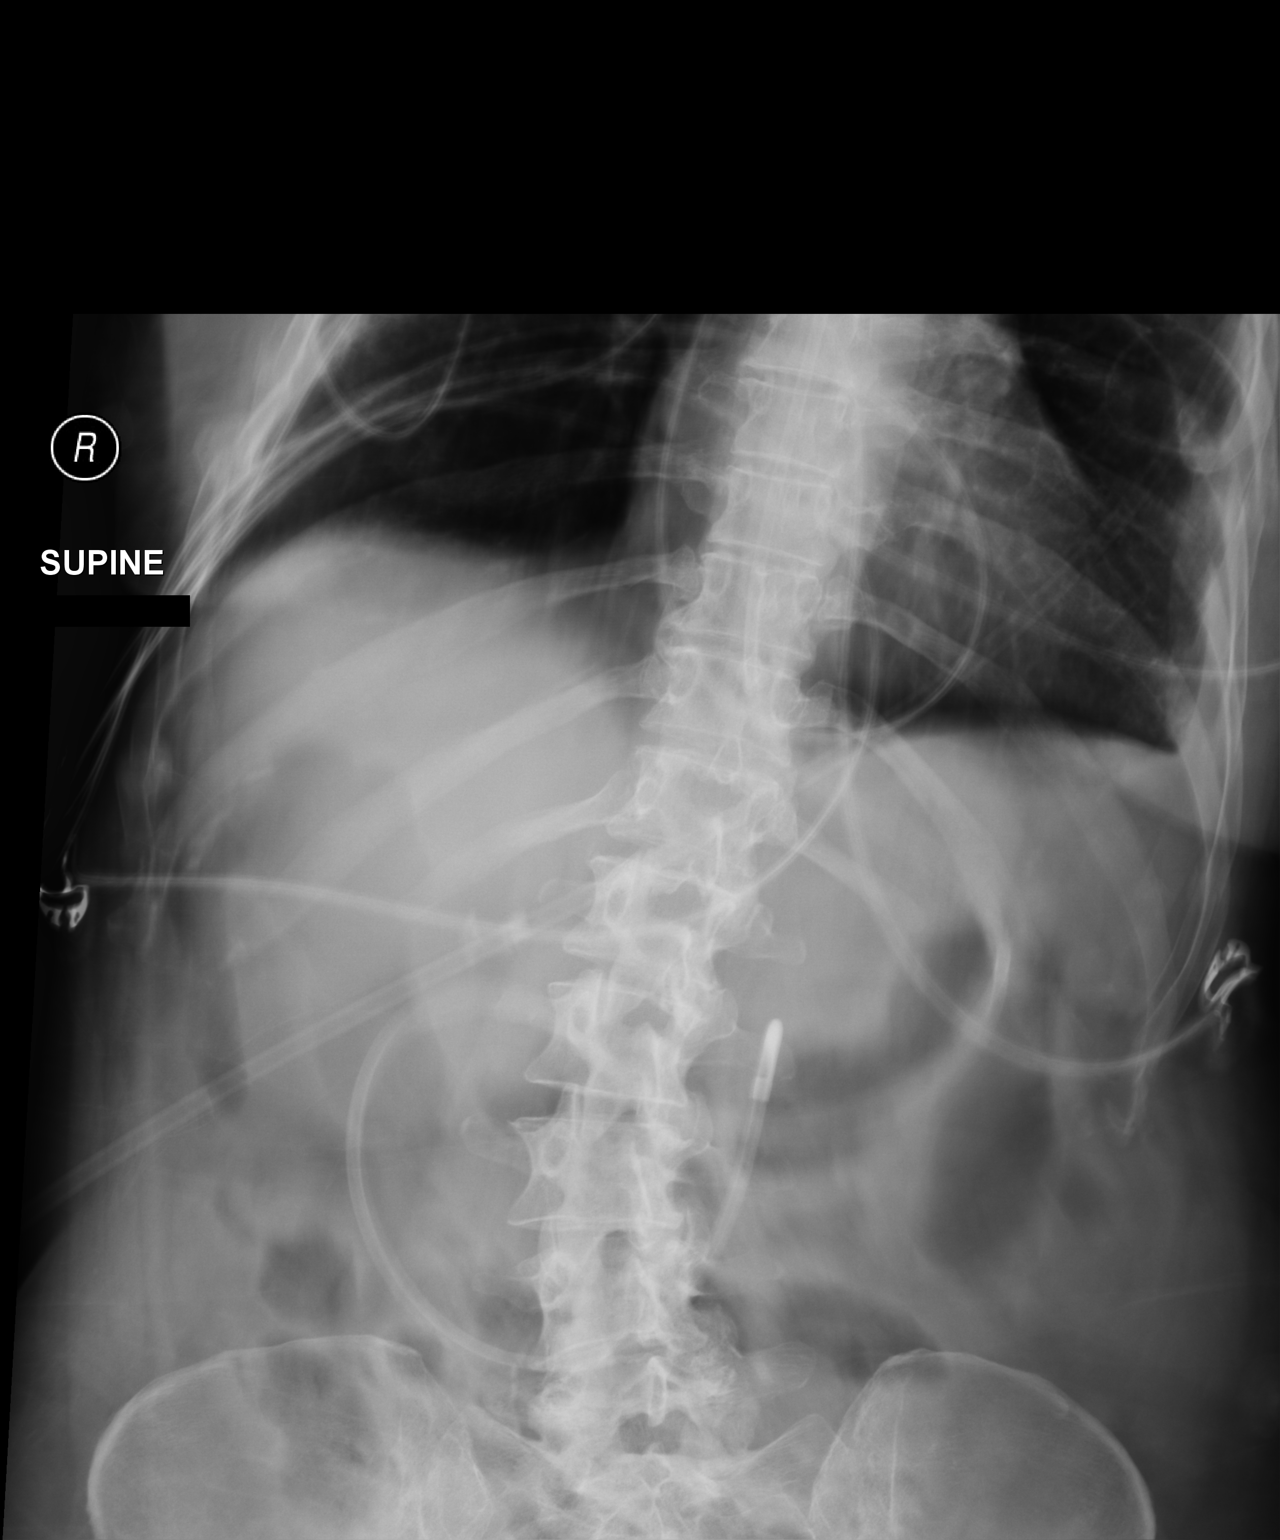

[1 of 1 positions shown; findings below may reference images not displayed]

FINDINGS: Feeding tube is again seen with the tip at the ligament of Treitz.
No new tube is identified.
IMPRESSION: Feeding tube tip is at the ligament of Treitz, unchanged. No new
tube is seen.

## 2017-08-31 ENCOUNTER — Ambulatory Visit (INDEPENDENT_AMBULATORY_CARE_PROVIDER_SITE_OTHER): Payer: Medicaid Other | Admitting: Emergency Medicine

## 2017-08-31 ENCOUNTER — Other Ambulatory Visit (INDEPENDENT_AMBULATORY_CARE_PROVIDER_SITE_OTHER): Payer: Self-pay

## 2017-08-31 ENCOUNTER — Ambulatory Visit (INDEPENDENT_AMBULATORY_CARE_PROVIDER_SITE_OTHER)
Admission: RE | Admit: 2017-08-31 | Discharge: 2017-08-31 | Disposition: A | Discharge status: Home / Self Care | Payer: Medicaid Other | Source: Ambulatory Visit | Attending: Emergency Medicine | Admitting: Emergency Medicine

## 2017-08-31 ENCOUNTER — Encounter: Payer: Self-pay | Admitting: Emergency Medicine

## 2017-08-31 VITALS — BP 134/68 | HR 63 | Ht 63.0 in | Wt 139.0 lb

## 2017-08-31 DIAGNOSIS — J440 Chronic obstructive pulmonary disease with acute lower respiratory infection: Secondary | ICD-10-CM

## 2017-08-31 DIAGNOSIS — J209 Acute bronchitis, unspecified: Secondary | ICD-10-CM | POA: Insufficient documentation

## 2017-08-31 DIAGNOSIS — J449 Chronic obstructive pulmonary disease, unspecified: Secondary | ICD-10-CM

## 2017-08-31 DIAGNOSIS — J852 Abscess of lung without pneumonia: Secondary | ICD-10-CM

## 2017-08-31 LAB — BASIC METABOLIC PANEL
BUN: 18 mg/dL (ref 6–23)
CALCIUM: 9.7 mg/dL (ref 8.4–10.5)
CO2: 31 mEq/L (ref 19–32)
Chloride: 98 mEq/L (ref 96–112)
Creatinine, Ser: 1.35 mg/dL — ABNORMAL HIGH (ref 0.40–1.20)
GFR: 43.67 mL/min — AB (ref >60.00)
Glucose, Bld: 101 mg/dL — ABNORMAL HIGH (ref 70–99)
POTASSIUM: 5.2 meq/L — AB (ref 3.5–5.1)
SODIUM: 134 meq/L — AB (ref 135–145)

## 2017-08-31 MED ORDER — LEVOFLOXACIN 500 MG PO TABS: 500.0000 mg | ORAL_TABLET | Freq: Every day | ORAL | 0 refills | Status: DC

## 2017-08-31 MED ORDER — TIOTROPIUM BROMIDE-OLODATEROL 2.5-2.5 MCG/ACT IN AERS: 2.0000 | INHALATION_SPRAY | Freq: Every day | RESPIRATORY_TRACT | 0 refills | Status: DC

## 2017-08-31 NOTE — Assessment & Plan Note (Signed)
Start Levaquin for 7 days. Chest x-ray today to evaluate for pneumonia. She also needs a repeat CT scan of her chest as below.

## 2017-08-31 NOTE — Assessment & Plan Note (Signed)
History left lung abscess, left decortication. She needs a repeat CT scan of the chest to compare with February 2018

## 2017-08-31 NOTE — Addendum Note (Signed)
Addended by: Desmond Dike C on: 08/31/2017 03:26 PM   Modules accepted: Orders

## 2017-08-31 NOTE — Progress Notes (Signed)
Subjective:    Patient ID: [name], female    DOB: [birth year], 51 y.o.   MRN: [id number]  HPI  51 year old active smoker (20 pack years)with a history of bipolar disorder, migraines, seizures, chronic pain and narcotic use. Also carries a history of asthma. Note spirometry from 04/14/17 reviewed, difficult to interpret due to technique. She has been admitted and treated in 12/2016 for a multilobe or pneumococcal pneumonia lingular abscess and associated left hydropneumothorax. She underwent VATS decortication. The hospitalization was also significant for C. difficile colitis. Most recent CT scan of the chest was 12/25/16. She is not currently on any scheduled BD's, not on pulmicort anymore. She is hearing wheezing. She has a frequent cough, prod of greenish phlegm. She is having R flank / back pain. No fever, no sweats.     Review of Systems  Past Medical History:  Diagnosis Date  . Allergic rhinitis   . Arthritis   . Asthma   . Back pain   . Bipolar 1 disorder ([place])   . C. difficile diarrhea   . Cavitary lesion of lung   . Chronic low back pain 03/06/2014  . COPD (chronic obstructive pulmonary disease) ([place])   . Depression   . Fibroid   . Hypertension   . Hypomagnesemia   . IBS (irritable bowel syndrome)   . Migraine   . Scoliosis      Family History  Problem Relation Age of Onset  . Stroke Mother   . Hypertension Brother   . Hypertension Brother   . Hypertension Brother   . Allergic rhinitis Neg Hx   . Angioedema Neg Hx   . Asthma Neg Hx   . Atopy Neg Hx   . Eczema Neg Hx   . Immunodeficiency Neg Hx   . Urticaria Neg Hx      Social History   Social History  . Marital status: Single    Spouse name: N/A  . Number of children: 2  . Years of education: college   Occupational History  . disabled Disabled   Social History Main Topics  . Smoking status: Current Every Day Smoker    Packs/day: 0.50    Years: 30.00    Types: Cigarettes  . Smokeless tobacco:  Never Used  . Alcohol use No  . Drug use: No  . Sexual activity: No   Other Topics Concern  . Not on file   Social History Narrative  . No narrative on file     Allergies  Allergen Reactions  . Cymbalta [Duloxetine Hcl] Other (See Comments)    Mood changes...mean  . Paxil [Paroxetine Hcl] Other (See Comments)    Mean and aggressive Mean and aggressive  . Wellbutrin [Bupropion] Other (See Comments)    Rapid heartrate  . Zoloft [Sertraline] Other (See Comments)    Anger Anger  . Nyquil Multi-Symptom [Pseudoeph-Doxylamine-Dm-Apap] Hives  . Tramadol Itching     Outpatient Medications Prior to Visit  Medication Sig Dispense Refill  . acetaminophen (TYLENOL) 325 MG tablet Take 2 tablets (650 mg total) by mouth every 4 (four) hours as needed for mild pain (or temp > 37.5 C (99.5 F)).    . ALLERGY 10 MG tablet TAKE 1 TABLET (10 MG TOTAL) BY MOUTH EVERY MORNING. 30 tablet 0  . amLODipine (NORVASC) 5 MG tablet Take 1 tablet (5 mg total) by mouth daily. 30 tablet 0  . escitalopram (LEXAPRO) 20 MG tablet Take 2 tablets (40 mg total) by mouth daily. [place]  tablet 0  . gabapentin (NEURONTIN) 400 MG capsule Take 1 capsule (400 mg total) by mouth 3 (three) times daily. 90 capsule 0  . hydrALAZINE (APRESOLINE) 100 MG tablet Take 1 tablet (100 mg total) by mouth every 6 (six) hours. 90 tablet 0  . methocarbamol (ROBAXIN) 750 MG tablet Take 750 mg by mouth 3 (three) times daily.    . metoprolol (LOPRESSOR) 100 MG tablet Take 1 tablet (100 mg total) by mouth 2 (two) times daily. 60 tablet 0  . QUEtiapine (SEROQUEL) 100 MG tablet Take 300 mg by mouth at bedtime.     Marland Kitchen albuterol (PROAIR HFA) 108 (90 Base) MCG/ACT inhaler Inhale 2 puffs into the lungs every 6 (six) hours as needed for wheezing or shortness of breath. (Patient not taking: Reported on 08/31/2017) 1 Inhaler 1  . budesonide (PULMICORT) 0.5 MG/2ML nebulizer solution Take 2 mLs (0.5 mg total) by nebulization 2 (two) times daily. (Patient not  taking: Reported on 08/31/2017) 250 mL 0  . Carbinoxamine Maleate (RYVENT) 6 MG TABS Take 1 tablet by mouth 3 (three) times daily. 60 tablet 0  . ipratropium-albuterol (DUONEB) 0.5-2.5 (3) MG/3ML SOLN Take 3 mLs by nebulization every 6 (six) hours as needed. (Patient not taking: Reported on 08/31/2017) 225 mL 1  . lamoTRIgine (LAMICTAL) 100 MG tablet Take 1 tablet (100 mg total) by mouth 2 (two) times daily. (Patient not taking: Reported on 08/31/2017) 60 tablet 0  . levETIRAcetam (KEPPRA) 1000 MG tablet Take 1 tablet (1,000 mg total) by mouth 2 (two) times daily. (Patient not taking: Reported on 08/31/2017) 60 tablet 0  . doxycycline (VIBRAMYCIN) 100 MG capsule Take 1 capsule (100 mg total) by mouth 2 (two) times daily. 14 capsule 0  . mometasone-formoterol (DULERA) 200-5 MCG/ACT AERO Inhale 2 puffs into the lungs 2 (two) times daily. 1 Inhaler 5  . predniSONE (DELTASONE) 20 MG tablet Take 2 tablets (40 mg total) by mouth daily. 8 tablet 0  . Tiotropium Bromide Monohydrate (SPIRIVA RESPIMAT) 1.25 MCG/ACT AERS Inhale 2 puffs into the lungs daily. 1 Inhaler 5   No facility-administered medications prior to visit.          Objective:   Physical Exam Vitals:   08/31/17 1458 08/31/17 1459  BP:  134/68  Pulse:  63  SpO2:  98%  Weight: 139 lb (63 kg)   Height: 5\' 3"  (1.6 m)    Gen: Pleasant, well-nourished, in no distress,  normal affect  ENT: No lesions,  mouth clear,  oropharynx clear, no postnasal drip  Neck: No JVD, no stridor  Lungs: No use of accessory muscles, mild B exp wheezes  Cardiovascular: RRR, heart sounds normal, no murmur or gallops, no peripheral edema  Musculoskeletal: No deformities, no cyanosis or clubbing  Neuro: alert, non focal  Skin: Warm, no lesions or rashes     Assessment & Plan:  Asthma She will need full pulmonary function testing. I will wait until after her acute illness to perform  Acute bronchitis with COPD ([place]) Start Levaquin for 7 days. Chest  x-ray today to evaluate for pneumonia. She also needs a repeat CT scan of her chest as below.  Abscess of lower lobe of left lung with pneumonia ([place]) History left lung abscess, left decortication. She needs a repeat CT scan of the chest to compare with February 2018  Baltazar Apo, MD, PhD 08/31/2017, 3:25 PM [facility] [phone] or if no answer [phone]

## 2017-08-31 NOTE — Addendum Note (Signed)
Addended by: Len Blalock on: 08/31/2017 03:28 PM   Modules accepted: Orders

## 2017-08-31 NOTE — Addendum Note (Signed)
Addended by: Desmond Dike C on: 08/31/2017 03:47 PM   Modules accepted: Orders

## 2017-08-31 NOTE — Assessment & Plan Note (Signed)
She will need full pulmonary function testing. I will wait until after her acute illness to perform

## 2017-08-31 NOTE — Patient Instructions (Addendum)
Please take levaquin 500mg  daily for the next 7 days.  We will perform a CXR today We will perform a CT scan of your chest  We will need to perform pulmonary function testing in the future after your recover from this acute illness.  Please start Stiolto 2 puffs once a day.  Use albuterol 2 puffs up to every 4 hours if needed for shortness of breath.  Follow with Dr Lamonte Sakai in 2 weeks to review your Ct scan results.

## 2017-08-31 NOTE — Progress Notes (Signed)
   Subjective:    Patient ID: [name], female    DOB: 11/15/1966, 51 y.o.   MRN: [id number]  HPI    Review of Systems  Constitutional: Negative for fever and unexpected weight change.  HENT: Negative for congestion, dental problem, ear pain, nosebleeds, postnasal drip, rhinorrhea, sinus pressure, sneezing, sore throat and trouble swallowing.   Eyes: Negative for redness and itching.  Respiratory: Negative for cough, chest tightness, shortness of breath and wheezing.   Cardiovascular: Negative for palpitations and leg swelling.  Gastrointestinal: Negative for nausea and vomiting.  Genitourinary: Negative for dysuria.  Musculoskeletal: Negative for joint swelling.  Skin: Negative for rash.  Neurological: Negative for headaches.  Hematological: Does not bruise/bleed easily.  Psychiatric/Behavioral: Negative for dysphoric mood. The patient is not nervous/anxious.        Objective:   Physical Exam        Assessment & Plan:

## 2017-09-02 ENCOUNTER — Telehealth: Payer: Self-pay | Admitting: Emergency Medicine

## 2017-09-02 ENCOUNTER — Other Ambulatory Visit: Payer: Self-pay | Admitting: Allergy

## 2017-09-02 NOTE — Telephone Encounter (Signed)
Patient needs to be seen.

## 2017-09-02 NOTE — Telephone Encounter (Signed)
Spoke with patient. She was seen by RB on 08/31/17 and was prescribed Levaquin 500mg  for her symptoms. She stated that she is now feeling worse. Non productive cough. She is coughing so hard that she is sore. Her entire back is hurting, especially on the right side. She does have some chest tightness. Denied any fever but states she just feels tired. She did not take the Levaquin today since she is feel worse.   Patient wishes to use [facility] in [facility].   JN, please advise since RB is not in the office today.

## 2017-09-02 NOTE — Telephone Encounter (Signed)
Spoke with patient. She has been scheduled with MW tomorrow at 43 for an acute visit. She verbalized understanding. Nothing else needed at time of call.

## 2017-09-03 ENCOUNTER — Ambulatory Visit (INDEPENDENT_AMBULATORY_CARE_PROVIDER_SITE_OTHER): Payer: Medicaid Other | Admitting: Internal Medicine

## 2017-09-03 ENCOUNTER — Encounter: Payer: Self-pay | Admitting: Internal Medicine

## 2017-09-03 VITALS — BP 108/64 | HR 68 | Temp 97.9°F | Ht 63.0 in | Wt 139.8 lb

## 2017-09-03 DIAGNOSIS — J209 Acute bronchitis, unspecified: Secondary | ICD-10-CM | POA: Diagnosis not present

## 2017-09-03 DIAGNOSIS — J440 Chronic obstructive pulmonary disease with acute lower respiratory infection: Secondary | ICD-10-CM | POA: Diagnosis not present

## 2017-09-03 DIAGNOSIS — F17210 Nicotine dependence, cigarettes, uncomplicated: Secondary | ICD-10-CM | POA: Diagnosis not present

## 2017-09-03 DIAGNOSIS — J851 Abscess of lung with pneumonia: Secondary | ICD-10-CM

## 2017-09-03 DIAGNOSIS — I10 Essential (primary) hypertension: Secondary | ICD-10-CM | POA: Diagnosis not present

## 2017-09-03 MED ORDER — FLUTTER DEVI: 0 refills | Status: AC

## 2017-09-03 MED ORDER — BISOPROLOL FUMARATE 5 MG PO TABS: ORAL_TABLET | ORAL | 11 refills | Status: DC

## 2017-09-03 MED ORDER — METHYLPREDNISOLONE ACETATE 80 MG/ML IJ SUSP
120.0000 mg | Freq: Once | INTRAMUSCULAR | Status: AC
  Administered 2017-09-03: 120 mg via INTRAMUSCULAR

## 2017-09-03 MED ORDER — AMOXICILLIN-POT CLAVULANATE 875-125 MG PO TABS: 1.0000 | ORAL_TABLET | Freq: Two times a day (BID) | ORAL | 0 refills | Status: DC

## 2017-09-03 NOTE — Patient Instructions (Addendum)
Please call when you get home if the medication don't match up 100%   Depomedrol 120 mg IM today   For cough > mucinex dm 1200 mg every 12 hours and the flutter valve as much as possible   Stop lopressor also known metaprolol and start bisoprolol 5 mg twice daily in its place   Augmentin 875 mg take one pill twice daily  X 10 days - take at breakfast and supper with large glass of water.  It would help reduce the usual side effects (diarrhea and yeast infections) if you ate cultured yogurt at lunch.    Rantidine 300 mg twice daily after bfast and supper until you return   GERD (REFLUX)  is an extremely common cause of respiratory symptoms just like yours , many times with no obvious heartburn at all.    It can be treated with medication, but also with lifestyle changes including elevation of the head of your bed (ideally with 6 inch  bed blocks),  Smoking cessation, avoidance of late meals, excessive alcohol, and avoid fatty foods, chocolate, peppermint, colas, red wine, and acidic juices such as orange juice.  NO MINT OR MENTHOL PRODUCTS SO NO COUGH DROPS   USE SUGARLESS CANDY INSTEAD (Jolley ranchers or Stover's or Life Savers) or even ice chips will also do - the key is to swallow to prevent all throat clearing. NO OIL BASED VITAMINS - use powdered substitutes.   Plan A = Automatic = Stiolto 2 puffs  First thing each am   Work on inhaler technique:  relax and gently blow all the way out then take a nice smooth deep breath back in, triggering the inhaler at same time you start breathing in.  Hold for up to 5 seconds if you can. Blow out thru nose. Rinse and gargle with water when done      Plan B = Backup Only use your albuterol (proair) as a rescue medication to be used if you can't catch your breath by resting or doing a relaxed purse lip breathing pattern.  - The less you use it, the better it will work when you need it. - Ok to use the inhaler up to 2 puffs  every 4 hours if you  must but call for appointment if use goes up over your usual need - Don't leave home without it !!  (think of it like the spare tire for your car)    Discuss the pain medication with the doctor prescribing    The key is to stop smoking completely before smoking completely stops you!

## 2017-09-03 NOTE — Progress Notes (Signed)
Subjective:    Patient ID: [name], female    DOB: 05/15/1966, 51 y.o.   MRN: [id number]  Brief patient profile:   51 year old active smoker (20 pack years)with a history of bipolar disorder, migraines, seizures, chronic pain and narcotic use. Also carries a history of asthma. Note spirometry from 04/14/17   difficult to interpret due to technique. She has been admitted and treated in 12/2016 for a multilobe or pneumococcal pneumonia lingular abscess and associated left hydropneumothorax. She underwent VATS decortication. The hospitalization was also significant for C. difficile colitis. Most recent CT scan of the chest was 12/25/16.    History of Present Illness  Seen  08/31/17 with acute flare by Dr Byrum rec Please take levaquin 500mg daily for the next 7 days.  We will perform a CXR >> COPD with stable pleuroparenchymal scarring. Multiple old rib fractures bilaterally. No CHF nor pneumonia. We will perform a CT scan of your chest  We will need to perform pulmonary function testing in the future after your recover from this acute illness.  Please start Stiolto 2 puffs once a day.  Use albuterol 2 puffs up to every 4 hours if needed for shortness of breath.     10 /09/2017 acute extended ov/Wert re:  Chief Complaint  Patient presents with  . Acute Visit    Pt states woke up yesterday "and couldn't breathe" and states that she started having pain under her rigth shoulder blade, which is new for her. She also reports feeling dizzy and this is new also. She is coughing with large amounts of green sputum.     present flare x 2 weeks sore throat, hoarseness, cough with green abx   And L chest cp at site of prev surgery and both ears hurting > byrum rx levaquin x 2 doses / and started stiolto  And onset of new R sided post chest pain since then worse with coughing/ position change / assoc with overt HB/ no better with saba though hfa poor (see a/p_   No obvious pattern in day to day or  daytime variability or assoc   mucus plugs or hemoptysis or cp or chest tightness, subjective wheeze or overt sinus   symptoms. No unusual exp hx or h/o childhood pna/ asthma or knowledge of premature birth.  Sleeping ok flat without nocturnal  or early am exacerbation  of respiratory  c/o's or need for noct saba. Also denies any obvious fluctuation of symptoms with weather or environmental changes or other aggravating or alleviating factors except as outlined above   Current Allergies, Complete Past Medical History, Past Surgical History, Family History, and Social History were reviewed in [employer] record.  ROS  The following are not active complaints unless bolded Hoarseness, sore throat, dysphagia, dental problems, itching, sneezing,  nasal congestion or discharge of excess mucus or purulent secretions, ear ache,   fever, chills, sweats, unintended wt loss or wt gain, classically pleuritic or exertional cp,  orthopnea pnd or leg swelling, presyncope, palpitations, abdominal pain, anorexia, nausea, vomiting, diarrhea  or change in bowel habits or change in bladder habits, change in stools or change in urine, dysuria, hematuria,  rash, arthralgias, visual complaints, headache, numbness, weakness or ataxia or problems with walking or coordination,  change in mood/affect or memory.        Current Meds  Medication Sig  . albuterol (PROAIR HFA) 108 (90 Base) MCG/ACT inhaler Inhale 2 puffs into the lungs every 6 (six) hours as  needed for wheezing or shortness of breath.  . Cholecalciferol (VITAMIN D3) 50000 units CAPS Take 1 capsule by mouth every 7 (seven) days.  Marland Kitchen escitalopram (LEXAPRO) 20 MG tablet Take 2 tablets (40 mg total) by mouth daily.  Marland Kitchen gabapentin (NEURONTIN) 400 MG capsule Take 1 capsule (400 mg total) by mouth 3 (three) times daily. (Patient taking differently: Take 800 mg by mouth 3 (three) times daily. )  . hydrOXYzine (VISTARIL) 50 MG capsule Take 50 mg by mouth  3 (three) times daily as needed.  . lamoTRIgine (LAMICTAL) 100 MG tablet Take 1 tablet (100 mg total) by mouth 2 (two) times daily.  . meloxicam (MOBIC) 15 MG tablet Take 15 mg by mouth daily.  . methocarbamol (ROBAXIN) 750 MG tablet Take 750 mg by mouth 3 (three) times daily.  . QUEtiapine (SEROQUEL) 100 MG tablet Take 300 mg by mouth at bedtime.   . rosuvastatin (CRESTOR) 20 MG tablet Take 20 mg by mouth daily.  . tapentadol (NUCYNTA) 50 MG tablet Take 50 mg by mouth 3 (three) times daily.  Marland Kitchen triamterene-hydrochlorothiazide (MAXZIDE-25) 37.5-25 MG tablet Take 1 tablet by mouth daily.  . [DISCONTINUED] metoprolol (LOPRESSOR) 100 MG tablet Take 1 tablet (100 mg total) by mouth 2 (two) times daily.                                       Objective:   Physical Exam   Chronically ill wf > stated age   Wt Readings from Last 3 Encounters:  09/03/17 139 lb 12.8 oz (63.4 kg)  08/31/17 139 lb (63 kg)  02/26/17 155 lb (70.3 kg)    Vital signs reviewed  - Note on arrival 02 sats  94% on RA    HEENT: nl dentition, turbinates bilaterally, and oropharynx. Nl external ear canals without cough reflex   NECK :  without JVD/Nodes/TM/ nl carotid upstrokes bilaterally   LUNGS: no acc muscle use,  Nl contour chest which is clear to A and P bilaterally without cough on insp or exp maneuvers   CV:  RRR  no s3 or murmur or increase in P2, and no edema   ABD:  soft and nontender with nl inspiratory excursion in the supine position. No bruits or organomegaly appreciated, bowel sounds nl  MS:  Nl gait/ ext warm without deformities, calf tenderness, cyanosis or clubbing No obvious joint restrictions   SKIN: warm and dry without lesions    NEURO:  alert, approp, nl sensorium with  no motor or cerebellar deficits apparent.      I personally reviewed images and agree with radiology impression as follows:  CXR:   08/31/17 COPD with stable pleuroparenchymal scarring. Multiple old  rib fractures bilaterally. No CHF nor pneumonia.    Assessment & Plan:

## 2017-09-06 ENCOUNTER — Telehealth: Payer: Self-pay | Admitting: Internal Medicine

## 2017-09-06 ENCOUNTER — Encounter: Payer: Self-pay | Admitting: Internal Medicine

## 2017-09-06 NOTE — Assessment & Plan Note (Signed)
Symptoms are markedly disproportionate to objective findings and not clear this is actually much of a  lung problem but pt does appear to have difficult to sort out respiratory symptoms of unknown origin for which  DDX  = almost all start with A and  include Adherence, Ace Inhibitors, Acid Reflux, Active Sinus Disease, Alpha 1 Antitripsin deficiency, Anxiety masquerading as Airways dz,  ABPA,  Allergy(esp in young), Aspiration (esp in elderly), Adverse effects of meds,  Active smokers, A bunch of PE's (a small clot burden can't cause this syndrome unless there is already severe underlying pulm or vascular dz with poor reserve) plus two Bs  = Bronchiectasis and Beta blocker use..and one C= CHF     Adherence is always the initial "prime suspect" and is a multilayered concern that requires a "trust but verify" approach in every patient - starting with knowing how to use medications, especially inhalers, correctly, keeping up with refills and understanding the fundamental difference between maintenance and prns vs those medications only taken for a very short course and then stopped and not refilled.  -  09/03/2017  After extensive coaching HFA effectiveness =    75%  ? Acid (or non-acid) GERD > always difficult to exclude as up to 75% of pts in some series report no assoc GI/ Heartburn symptoms> rec max (24h)  acid suppression and diet restrictions/ reviewed and instructions given in writing.    ? Active sinus dz: Augmentin 875 mg take one pill twice daily  X 10 days - take at breakfast and supper with large glass of water.  It would help reduce the usual side effects (diarrhea and yeast infections) if you ate cultured yogurt at lunch.   ? Anxiety > usually at the bottom of this list of usual suspects but should be much higher on this pt's based on H and P and note already on psychotropics .    ? Bb effect > see hbp a/p > try bisoprolol    I had an extended discussion with the patient reviewing all  relevant studies completed to date and  lasting 25 minutes of a 40  minute acute visit with pt new to me    re  severe non-specific but potentially very serious refractory respiratory symptoms of uncertain and potentially multiple  etiologies.   Each maintenance medication was reviewed in detail including most importantly the difference between maintenance and prns and under what circumstances the prns are to be triggered using an action plan format that is not reflected in the computer generated alphabetically organized AVS.    Please see AVS for specific instructions unique to this office visit that I personally wrote and verbalized to the the pt in detail and then reviewed with pt  by my nurse highlighting any changes in therapy/plan of care  recommended at today's visit.

## 2017-09-06 NOTE — Assessment & Plan Note (Signed)
Strongly prefer in this setting: Bystolic, the most beta -1  selective Beta blocker available in sample form, with bisoprolol the most selective generic choice  on the market.   Try bisoprolol 5 mg bid  

## 2017-09-06 NOTE — Telephone Encounter (Signed)
Called to report jitteriness and racing heart after changed metaprolol to bisoprolol  rec change back to metaprolol at prev dose, first one now.  Call 09/07/17 if not better

## 2017-09-06 NOTE — Assessment & Plan Note (Signed)

## 2017-09-06 NOTE — Assessment & Plan Note (Signed)
No evidence of active abscess

## 2017-09-08 ENCOUNTER — Ambulatory Visit (INDEPENDENT_AMBULATORY_CARE_PROVIDER_SITE_OTHER)
Admission: RE | Admit: 2017-09-08 | Discharge: 2017-09-08 | Disposition: A | Discharge status: Home / Self Care | Payer: Medicaid Other | Source: Ambulatory Visit | Attending: Emergency Medicine | Admitting: Emergency Medicine

## 2017-09-08 DIAGNOSIS — J852 Abscess of lung without pneumonia: Secondary | ICD-10-CM | POA: Diagnosis not present

## 2017-09-08 MED ORDER — IOPAMIDOL (ISOVUE-300) INJECTION 61%
75.0000 mL | Freq: Once | INTRAVENOUS | Status: AC | PRN
  Administered 2017-09-08: 75 mL via INTRAVENOUS

## 2017-09-09 ENCOUNTER — Encounter: Payer: Self-pay | Admitting: Pulmonary Disease

## 2017-09-09 ENCOUNTER — Telehealth: Payer: Self-pay | Admitting: Internal Medicine

## 2017-09-09 ENCOUNTER — Other Ambulatory Visit: Payer: Medicaid Other

## 2017-09-09 ENCOUNTER — Ambulatory Visit (INDEPENDENT_AMBULATORY_CARE_PROVIDER_SITE_OTHER): Payer: Medicaid Other | Admitting: Pulmonary Disease

## 2017-09-09 VITALS — BP 134/72 | HR 61 | Ht 63.0 in | Wt 138.1 lb

## 2017-09-09 DIAGNOSIS — R05 Cough: Secondary | ICD-10-CM | POA: Diagnosis not present

## 2017-09-09 DIAGNOSIS — R059 Cough, unspecified: Secondary | ICD-10-CM

## 2017-09-09 DIAGNOSIS — F17210 Nicotine dependence, cigarettes, uncomplicated: Secondary | ICD-10-CM | POA: Diagnosis not present

## 2017-09-09 DIAGNOSIS — F17200 Nicotine dependence, unspecified, uncomplicated: Secondary | ICD-10-CM | POA: Insufficient documentation

## 2017-09-09 DIAGNOSIS — J449 Chronic obstructive pulmonary disease, unspecified: Secondary | ICD-10-CM | POA: Diagnosis not present

## 2017-09-09 MED ORDER — PREDNISONE 20 MG PO TABS: 40.0000 mg | ORAL_TABLET | Freq: Every day | ORAL | 0 refills | Status: DC

## 2017-09-09 MED ORDER — DOXYCYCLINE HYCLATE 100 MG PO TABS: 100.0000 mg | ORAL_TABLET | Freq: Two times a day (BID) | ORAL | 0 refills | Status: DC

## 2017-09-09 NOTE — Telephone Encounter (Signed)
Called and spoke with pt and she is aware of appt with JN today at 1130.  Pt has been sob with cough x several days.  Cough is producing thick, white frothy sputum.  Nothing further is needed.

## 2017-09-09 NOTE — Patient Instructions (Addendum)
   Use the Trelegy inhaler we are giving you today in place of your Stiolto. Call us for a prescription if this seems to help your breathing more.  Remember to remove any dentures or partials you have before you use your inhaler. Remember to brush your teeth & tongue after you use your inhaler as well as rinse, gargle & spit to keep from getting thrush in your mouth or on your tongue (a white film).   Stop the current antibiotic that you are on. I'm switching you over to Doxycycline.  Take the Doxycycline with a full glass of water & remain upright for 1 hour afterward. Do not take this with any dairy products.  Use your Albuterol inhaler 3 times daily to help your cough.  Your sputum specimen should be dropped off to the lab within 4 hours and should not be refrigerated.   TESTS ORDERED: 1. Serum alpha-1 antitrypsin phenotype today 2. Sputum culture for AFB, Fungus, & Routine.

## 2017-09-09 NOTE — Progress Notes (Signed)
Subjective:    Patient ID: [name], female    DOB: 06-Sep-1966, 51 y.o.   MRN: [id number]  [facility].:  Acute visit for Cough w/ known Asthma & Tobacco Use Disorder.  HPI Patient last seen by Dr. Melvyn Novas on 10/11 & prescribed Augmentin. She was switched over to Amoxicillin due to the Augmentin making her sick. Known history of admission with multi lobar pneumonia in February 2018 with lingular abscess and left hydropneumothorax. Patient subsequently underwent VATS with decortication.  Cough: Patient previously prescribed Augmentin for "acute bronchitis" after appointment on 10/11. She is now taking Amoxicillin. She reports her cough is now producing a "light green" to "heavy, thick white" mucus.   Asthma:  Spirometry from previous testing was reviewed indicating very severe to severe airway obstruction with a previously documented significant bronchodilator response. Patient is following with Dr. Ernst Bowler. She has had wheezing more lately as well. She has been using Stiolto daily. She is using her rescue inhaler infrequently. Previously was on [facility] but couldn't afford it and it didn't seem to help.   Tobacco use disorder: Patient has evidence of emphysema on chest CT imaging. She reports now she is smoking 11 cigarettes per day. She has tried nicotine patches and lozenges as well.   Review of Systems She reports pain in her back and chest that seems to have been more lately. She reports a pleuritic component to her pain. She also reports pain in her ears as well. She describes a fullness to her ears. She reports a new frontal sinus headache as well. She hasn't been taking her Claritin recently. She does have sinus congestion & drainage. She denies any fever or chills. She denies any sweats.   Allergies  Allergen Reactions  . Cymbalta [Duloxetine Hcl] Other (See Comments)    Mood changes...mean  . Paxil [Paroxetine Hcl] Other (See Comments)    Mean and aggressive Mean and aggressive  .  Wellbutrin [Bupropion] Other (See Comments)    Rapid heartrate  . Zoloft [Sertraline] Other (See Comments)    Anger Anger  . Nyquil Multi-Symptom [Pseudoeph-Doxylamine-Dm-Apap] Hives  . Tramadol Itching    Current Outpatient Prescriptions on File Prior to Visit  Medication Sig Dispense Refill  . acetaminophen (TYLENOL) 325 MG tablet Take 2 tablets (650 mg total) by mouth every 4 (four) hours as needed for mild pain (or temp > 37.5 C (99.5 F)).    Marland Kitchen albuterol (PROAIR HFA) 108 (90 Base) MCG/ACT inhaler Inhale 2 puffs into the lungs every 6 (six) hours as needed for wheezing or shortness of breath. 1 Inhaler 1  . ALLERGY 10 MG tablet TAKE 1 TABLET (10 MG TOTAL) BY MOUTH EVERY MORNING. 30 tablet 0  . amoxicillin-clavulanate (AUGMENTIN) 875-125 MG tablet Take 1 tablet by mouth 2 (two) times daily. 20 tablet 0  . Cholecalciferol (VITAMIN D3) 50000 units CAPS Take 1 capsule by mouth every 7 (seven) days.    Marland Kitchen escitalopram (LEXAPRO) 20 MG tablet Take 2 tablets (40 mg total) by mouth daily. 30 tablet 0  . gabapentin (NEURONTIN) 400 MG capsule Take 1 capsule (400 mg total) by mouth 3 (three) times daily. (Patient taking differently: Take 800 mg by mouth 3 (three) times daily. ) 90 capsule 0  . hydrALAZINE (APRESOLINE) 100 MG tablet Take 1 tablet (100 mg total) by mouth every 6 (six) hours. 90 tablet 0  . hydrOXYzine (VISTARIL) 50 MG capsule Take 50 mg by mouth 3 (three) times daily as needed.    . lamoTRIgine (  LAMICTAL) 100 MG tablet Take 1 tablet (100 mg total) by mouth 2 (two) times daily. 60 tablet 0  . levETIRAcetam (KEPPRA) 1000 MG tablet Take 1 tablet (1,000 mg total) by mouth 2 (two) times daily. 60 tablet 0  . meloxicam (MOBIC) 15 MG tablet Take 15 mg by mouth daily.    . methocarbamol (ROBAXIN) 750 MG tablet Take 750 mg by mouth 3 (three) times daily.    . QUEtiapine (SEROQUEL) 100 MG tablet Take 300 mg by mouth at bedtime.     Marland Kitchen Respiratory Therapy Supplies (FLUTTER) DEVI Use as directed 1  each 0  . rosuvastatin (CRESTOR) 20 MG tablet Take 20 mg by mouth daily.    . Tiotropium Bromide-Olodaterol (STIOLTO RESPIMAT) 2.5-2.5 MCG/ACT AERS Inhale 2 puffs into the lungs daily. 1 Inhaler 0  . triamterene-hydrochlorothiazide (MAXZIDE-25) 37.5-25 MG tablet Take 1 tablet by mouth daily.     No current facility-administered medications on file prior to visit.     Past Medical History:  Diagnosis Date  . Allergic rhinitis   . Arthritis   . Asthma   . Back pain   . Bipolar 1 disorder ([place])   . C. difficile diarrhea   . Cavitary lesion of lung   . Chronic low back pain 03/06/2014  . COPD (chronic obstructive pulmonary disease) ([place])   . Depression   . Fibroid   . Hypertension   . Hypomagnesemia   . IBS (irritable bowel syndrome)   . Migraine   . Scoliosis     Past Surgical History:  Procedure Laterality Date  . CHOLECYSTECTOMY    . DILATION AND CURETTAGE OF UTERUS    . EXPLORATORY LAPAROTOMY     WHEN PATIENT WAS YOUNG  . RIGHT FOOT SURGERY    . TUBAL LIGATION Bilateral   . VIDEO ASSISTED THORACOSCOPY (VATS)/DECORTICATION Left 12/26/2016   Procedure: LEFT VIDEO ASSISTED THORACOSCOPY (VATS)/DECORTICATION;  Surgeon: Ivin Poot, MD;  Location: [facility];  Service: Thoracic;  Laterality: Left;  Marland Kitchen VIDEO BRONCHOSCOPY N/A 12/26/2016   Procedure: VIDEO BRONCHOSCOPY WITH BRONCHIAL LAVAGE;  Surgeon: Ivin Poot, MD;  Location: [facility];  Service: Thoracic;  Laterality: N/A;    Family History  Problem Relation Age of Onset  . Stroke Mother   . Hypertension Brother   . Hypertension Brother   . Hypertension Brother   . Allergic rhinitis Neg Hx   . Angioedema Neg Hx   . Asthma Neg Hx   . Atopy Neg Hx   . Eczema Neg Hx   . Immunodeficiency Neg Hx   . Urticaria Neg Hx     Social History   Social History  . Marital status: Single    Spouse name: N/A  . Number of children: 2  . Years of education: college   Occupational History  . disabled Disabled   Social History Main  Topics  . Smoking status: Current Every Day Smoker    Packs/day: 0.50    Years: 30.00    Types: Cigarettes  . Smokeless tobacco: Never Used  . Alcohol use No  . Drug use: No  . Sexual activity: No   Other Topics Concern  . Not on file   Social History Narrative  . No narrative on file      Objective:   Physical Exam BP 134/72 (BP Location: Left Arm, Cuff Size: Normal)   Pulse 61   Ht 5' 3"  (1.6 m)   Wt 138 lb 2 oz (62.7 kg)   SpO2 96%  BMI 24.47 kg/m  General:  Awake. Alert. No acute distress. Caucasian female.  Integument:  Warm & dry. No rash on exposed skin. No bruising on exposed skin. Extremities:  No cyanosis or clubbing.  HEENT:  Moist mucus membranes. No oral ulcers. No nasal turbinate swelling. No scleral icterus Cardiovascular:  Regular rate. No edema. Regular rhythm.  Pulmonary:  Slightly diminished breath sounds in the bases. Otherwise clear to auscultation. Normal work of breathing. Abdomen: Soft. Normal bowel sounds. Nondistended.  Musculoskeletal:  Normal bulk and tone. No joint deformity or effusion appreciated. Neurological:  Cranial nerves 2-12 grossly in tact. No meningismus. Moving all 4 extremities equally.   PFT 04/03/17: FVC 1.11 L (38%) FEV1 0.57 L (24%) FEV1/FVC 0.51 FEF 25-75 0.27 L (10%) 02/21/16: FVC 1.72 L (89%) FEV1 0.93 L (38%) FEV1/FVC 0.54 FEF 25-75 0.40 (18%) positive bronchodilator response  IMAGING CT CHEST W/ CONTRAST 09/08/17 (personally reviewed by me):  Apical predominant emphysematous changes. Multiple old bilateral rib fractures noted. No pathologic mediastinal adenopathy. No central pulmonary emboli. No parenchymal mass or opacity appreciated. No pleural effusion or thickening appreciated.  CARDIAC TTE (12/13/16):  LV normal in size with EF 65-70%. Normal regional wall motion. Grade 2 diastolic dysfunction. LA mildly dilated & RA normal in size. RV mildly dilated with preserved systolic function. Pulmonary artery systolic pressure  44 mmHg. No aortic stenosis or regurgitation. Aortic root normal in size. Trivial mitral regurgitation without stenosis. No significant pulmonic regurgitation. Mild tricuspid regurgitation. No pericardial effusion.  LABS 03/06/14 ANA: Negative Rheumatoid factor: 7.3 ESR: 2    Assessment & Plan:  51 y.o. female with what appears to be very severe COPD based upon my review of her previous spirometry. Additionally, she does have significant emphysema on her chest CT imaging also reviewed by me today. I do feel this is likely secondary to her ongoing tobacco use but we must assess for alpha-1 antitrypsin deficiency. Her cough is likely due to an acute bronchitis. It's unclear whether or not this is bacterial or viral but given the change in her sputum color and quantity I feel a bacterial superinfection is quite possible. I am switching her antibiotic regimen to doxycycline to help promote the anti-inflammatory effect. I'm also prescribing her a limited course of prednisone. We did discuss her chronic tobacco use at length today. I recommended nicotine replacement and gradual tapered usage. I instructed the patient to contact our office if she had new breathing problems or questions before her next appointment.  1. Very severe COPD with emphysema:  Screening for alpha-1 antitrypsin deficiency today. Switching from Stiolto to Trelegy. Patient instructed on proper oral hygiene and frequency. Starting patient on prednisone 40 mg daily for 5 days. 2. Cough:  Checking sputum culture for AFB, Fungus, & Bacteria. Recommended using her albuterol inhaler 3 times daily while ill. Prescribing doxycycline 100 mg by mouth twice a day 7 days in place of Augmentin/amoxicillin. 3. Tobacco use disorder:  Patient counseled for over 3 minutes on the need for complete tobacco cessation.  4. Health maintenance: Status post influenza vaccine October 2018 & Pneumovax December 2016. 5. Follow-up: Return to clinic in 1 week or  sooner if needed.  Sonia Baller Ashok Cordia, M.D. [facility] Pulmonary & Critical Care Pager:  [phone] After 7pm or if no response, call [phone] 12:11 PM 09/09/17

## 2017-09-10 ENCOUNTER — Ambulatory Visit: Payer: Self-pay | Admitting: Family Medicine

## 2017-09-12 LAB — ALPHA-1 ANTITRYPSIN PHENOTYPE: A-1 Antitrypsin, Ser: 166 mg/dL (ref 83–199)

## 2017-09-14 ENCOUNTER — Ambulatory Visit (INDEPENDENT_AMBULATORY_CARE_PROVIDER_SITE_OTHER): Payer: Medicaid Other | Admitting: Acute Care

## 2017-09-14 ENCOUNTER — Encounter: Payer: Self-pay | Admitting: Acute Care

## 2017-09-14 DIAGNOSIS — F17200 Nicotine dependence, unspecified, uncomplicated: Secondary | ICD-10-CM | POA: Diagnosis not present

## 2017-09-14 DIAGNOSIS — J449 Chronic obstructive pulmonary disease, unspecified: Secondary | ICD-10-CM | POA: Diagnosis not present

## 2017-09-14 NOTE — Progress Notes (Signed)
Note reviewed.  Sonia Baller Ashok Cordia, M.D. [facility] Pulmonary & Critical Care Pager:  [phone] After 7pm or if no response, call [phone] 5:58 PM 09/14/17

## 2017-09-14 NOTE — Patient Instructions (Addendum)
It is good to see you today. Please stop smoking cigarettes. Continuing to smoke increases your risk of lung cancer , heart disease and stroke. Start Symbicort  2 puffs twice daily for your COPD. Do not use Stialto or Trelegy Rinse mouth after use Follow up in 1 month with Dr. Ashok Cordia or NP to ensure you are doing well on Symbicort inhaler  Please contact office for sooner follow up if symptoms do not improve or worsen or seek emergency care

## 2017-09-14 NOTE — Progress Notes (Signed)
History of Present Illness _0 /22/2018 Follow Up Visit:  Pt. Was seen for an acute OV for COPD flare and acute bronchitis on 09/09/2017.She was seen by Dr. Ashok Cordia. He started her on Trelegy, and treated her with  prednisone 40 mg x 5 days, and doxycycline . Additionally he checked alpha-1 antitrypsin , checked sputum culture for AFB, Fungus, & Bacteria , and had her use her rescue inhaler more frequently while sick. She is here for follow up. She states she is not doing well. She states she continues to cough, she states her lungs hurt.She states she  has yellow to green secretions. She states that her secretions have changed from white to yellow  green  while on the antibiotics. She states she has had no fever. She states she completed the doxy and the prednisone.He states she had been using the Trelegy as prescribed by Dr. Ashok Cordia. She states that the [place] worked better, therefore she stopped using the trelogy. She states that she wants to use the Stialto twice daily. I explained that she cannot do that. I explained trilogy is a once a day medication. She states she has no fever, orthopnea or hemoptysis. She complains of chronic back pain. Per CT and imaging she has multiple old rib fractures bilaterally.She is asking for prescription narcotic from me which I have told her I will not prescribe.She is focused on having an inhaler that she can use twice daily.She continues to smoke, despite her chronic pulmonary issues despite counseling by Dr. Ashok Cordia at 09/09/2017 appointment. She understands that this directly affects her ability to breath. There was no sputum collected for the AFB, Fungus, & Bacteria.    Test Results: IMPRESSION: COPD with stable pleuroparenchymal scarring. Multiple old rib fractures bilaterally. No CHF nor pneumonia.  12/13/2016<< Echo EF 81-82% ,grade 2 diastolic dysfunction , Pulmonary arteries: PA peak pressure: 44 mm Hg (S).  PFT 04/03/17: FVC 1.11 L (38%) FEV1 0.57 L (24%) FEV1/FVC 0.51 FEF 25-75 0.27 L (10%) 02/21/16: FVC 1.72 L (89%) FEV1 0.93 L (38%) FEV1/FVC 0.54 FEF 25-75 0.40 (18%) positive bronchodilator response  IMAGING CT CHEST W/ CONTRAST 09/08/17 (personally reviewed by me):  Apical predominant emphysematous changes. Multiple old bilateral rib fractures noted. No pathologic mediastinal adenopathy. No central pulmonary emboli. No parenchymal mass or  opacity appreciated. No pleural effusion or thickening appreciated.  CARDIAC TTE (12/13/16):  LV normal in size with EF 65-70%. Normal regional wall motion. Grade 2 diastolic dysfunction. LA mildly dilated & RA normal in size. RV mildly dilated with  preserved systolic function. Pulmonary artery systolic pressure 44 mmHg. No aortic stenosis or regurgitation. Aortic root normal in size. Trivial mitral regurgitation without stenosis. No significant pulmonic regurgitation. Mild tricuspid regurgitation. No pericardial effusion.  LABS 03/06/14 ANA: Negative Rheumatoid factor: 7.3 ESR: 2  CBC Latest Ref Rng & Units 05/16/2017 02/08/2017 02/06/2017  WBC 4.0 - 10.5 K/uL 18.6(H) 4.8 5.1  Hemoglobin 12.0 - 15.0 g/dL 14.8 9.4(L) 10.5(L)  Hematocrit 36.0 - 46.0 % 43.2 30.4(L) 33.3(L)  Platelets 150 - 400 K/uL 254 236 261    BMP Latest Ref Rng & Units 08/31/2017 05/16/2017 02/08/2017  Glucose 70 - 99 mg/dL 101(H) 97 96  BUN 6 - 23 mg/dL _0 Creatinine 0.40 - 1.20 mg/dL 1.35(H) 1.05(H) 0.76  Sodium 135 - 145 mEq/L 134(L) 136 139  Potassium 3.5 - 5.1 mEq/L 5.2(H) 5.0 4.0  Chloride 96 - 112 mEq/L 98 101 107  CO2 19 - 32 mEq/L _1 Calcium 8.4 - 10.5 mg/dL 9.7 9.6 9.2    BNP    Component Value Date/Time   BNP 571.9 (H) 12/12/2015 1640   PFT No results found for: FEV1PRE, FEV1POST, FVCPRE, FVCPOST, TLC, DLCOUNC, PREFEV1FVCRT, PSTFEV1FVCRT  Dg Chest 2 View  Result Date: 09/01/2017 CLINICAL DATA:  Chronic cough, chest congestion, shortness of breath, and upper chest and back pain. Symptoms are gradually worsening. History of asthma -COPD, previous CVA, current smoker. EXAM: CHEST  2 VIEW COMPARISON:  Chest x-ray of May 16, 2017 FINDINGS: The lungs remain hyperinflated with mild hemidiaphragm flattening. There is stable scarring peripherally in the left mid lung. There is stable blunting of the posterolateral costophrenic angles bilaterally. The heart and pulmonary vascularity are normal. There are multiple old rib fractures noted bilaterally. There is no alveolar pneumonia. IMPRESSION: COPD with stable pleuroparenchymal scarring. Multiple old rib fractures bilaterally. No CHF nor pneumonia. Electronically Signed   By: David  Martinique M.D.    On: 09/01/2017 08:44   Ct Chest W Contrast  Result Date: 09/08/2017 CLINICAL DATA:  Productive cough history of recurrent pneumonia, smoker EXAM: CT CHEST WITH CONTRAST TECHNIQUE: Multidetector CT imaging of the chest was performed during intravenous contrast administration. CONTRAST:  60m ISOVUE-300 IOPAMIDOL (ISOVUE-300) INJECTION 61% COMPARISON:  Radiograph 08/31/2017 CT chest 12/25/2016 FINDINGS: Cardiovascular: Nonaneurysmal aorta. Minimal atherosclerotic calcification. Normal heart size. No pericardial effusion. Mediastinum/Nodes: No significant mediastinal adenopathy. Midline trachea. No thyroid mass. Esophagus within normal limits. Lungs/Pleura: Moderate emphysema. No acute consolidation or effusion. No pneumothorax. Minimal density in the left upper lobe and anterior aspect of the right upper lobe compatible with scarring. Upper Abdomen: No acute abnormality. Musculoskeletal: Multiple old bilateral rib fractures. IMPRESSION: 1. Moderate emphysema with minimal areas of scarring in the upper lobes. No consolidations or focal parenchymal abnormalities are seen. 2. Multiple old bilateral rib fractures. Aortic Atherosclerosis (ICD10-I70.0) and Emphysema (ICD10-J43.9). Electronically Signed   By: KDonavan FoilM.D.   On: 09/08/2017 17:37     Past medical hx Past Medical History:  Diagnosis Date  . Allergic rhinitis   . Arthritis   . Asthma   . Back pain   . Bipolar 1 disorder ([place]   . C. difficile diarrhea   . Cavitary lesion of lung   . Chronic low back pain 03/06/2014  .  COPD (chronic obstructive pulmonary disease) ([place])   . Depression   . Fibroid   . Hypertension   . Hypomagnesemia   . IBS (irritable bowel syndrome)   . Migraine   . Scoliosis      Social History  Substance Use Topics  . Smoking status: Current Every Day Smoker    Packs/day: 0.50    Years: 30.00    Types: Cigarettes  . Smokeless tobacco: Never Used  . Alcohol use No    Ms.Kluger reports that she has been  smoking Cigarettes.  She has a 15.00 pack-year smoking history. She has never used smokeless tobacco. She reports that she does not drink alcohol or use drugs.  Tobacco Cessation: Patient is a current every day smoker. I have spent 4 minutes counseling patient on smoking cessation this visit. We discussed the risks of continued tobacco abuse in light of her respiratory issues. She states she has "tried everything to quit".   Past surgical hx, Family hx, Social hx all reviewed.  Current Outpatient Prescriptions on File Prior to Visit  Medication Sig  . acetaminophen (TYLENOL) 325 MG tablet Take 2 tablets (650 mg total) by mouth every 4 (four) hours as needed for mild pain (or temp > 37.5 C (99.5 F)).  Marland Kitchen albuterol (PROAIR HFA) 108 (90 Base) MCG/ACT inhaler Inhale 2 puffs into the lungs every 6 (six) hours as needed for wheezing or shortness of breath.  . ALLERGY 10 MG tablet TAKE 1 TABLET (10 MG TOTAL) BY MOUTH EVERY MORNING.  Marland Kitchen Cholecalciferol (VITAMIN D3) 50000 units CAPS Take 1 capsule by mouth every 7 (seven) days.  Marland Kitchen doxycycline (VIBRA-TABS) 100 MG tablet Take 1 tablet (100 mg total) by mouth 2 (two) times daily.  Marland Kitchen escitalopram (LEXAPRO) 20 MG tablet Take 2 tablets (40 mg total) by mouth daily.  Marland Kitchen gabapentin (NEURONTIN) 400 MG capsule Take 1 capsule (400 mg total) by mouth 3 (three) times daily. (Patient taking differently: Take 800 mg by mouth 3 (three) times daily. )  . hydrALAZINE (APRESOLINE) 100 MG tablet Take 1 tablet (100 mg total) by mouth every 6 (six) hours.  . hydrOXYzine (VISTARIL) 50 MG capsule Take 50 mg by mouth 3 (three) times daily as needed.  . lamoTRIgine (LAMICTAL) 100 MG tablet Take 1 tablet (100 mg total) by mouth 2 (two) times daily.  Marland Kitchen levETIRAcetam (KEPPRA) 1000 MG tablet Take 1 tablet (1,000 mg total) by mouth 2 (two) times daily.  . meloxicam (MOBIC) 15 MG tablet Take 15 mg by mouth daily.  . methocarbamol (ROBAXIN) 750 MG tablet Take 750 mg by mouth 3 (three)  times daily.  . predniSONE (DELTASONE) 20 MG tablet Take 2 tablets (40 mg total) by mouth daily with breakfast.  . QUEtiapine (SEROQUEL) 100 MG tablet Take 300 mg by mouth at bedtime.   Marland Kitchen Respiratory Therapy Supplies (FLUTTER) DEVI Use as directed  . rosuvastatin (CRESTOR) 20 MG tablet Take 20 mg by mouth daily.  . Tiotropium Bromide-Olodaterol (STIOLTO RESPIMAT) 2.5-2.5 MCG/ACT AERS Inhale 2 puffs into the lungs daily.  Marland Kitchen triamterene-hydrochlorothiazide (MAXZIDE-25) 37.5-25 MG tablet Take 1 tablet by mouth daily.   No current facility-administered medications on file prior to visit.      Allergies  Allergen Reactions  . Cymbalta [Duloxetine Hcl] Other (See Comments)    Mood changes...mean  . Paxil [Paroxetine Hcl] Other (See Comments)    Mean and aggressive Mean and aggressive  . Wellbutrin [Bupropion] Other (See Comments)    Rapid heartrate  . Zoloft [  Sertraline] Other (See Comments)    Anger Anger  . Nyquil Multi-Symptom [Pseudoeph-Doxylamine-Dm-Apap] Hives  . Tramadol Itching    Review Of Systems:  Constitutional:   No  weight loss, night sweats,  Fevers, chills, fatigue, or  lassitude.  HEENT:   No headaches,  Difficulty swallowing,  Tooth/dental problems, or  Sore throat,                No sneezing, itching, ear ache, nasal congestion, post nasal drip,   CV:  No chest pain,  Orthopnea, PND, swelling in lower extremities, anasarca, dizziness, palpitations, syncope.   GI  No heartburn, indigestion, abdominal pain, nausea, vomiting, diarrhea, change in bowel habits, loss of appetite, bloody stools.   Resp: + shortness of breath with exertion less at rest.  No excess mucus, + productive cough,  + non-productive cough,  No coughing up of blood.  + change in color of mucus.  No wheezing.  No chest wall deformity  Skin: no rash or lesions.  GU: no dysuria, change in color of urine, no urgency or frequency.  No flank pain, no hematuria   MS:  No joint pain or swelling.  No  decreased range of motion.  + chronic  back pain.  Psych:  No change in mood or affect. No depression or anxiety.  No memory loss.   Vital Signs BP 130/70 (BP Location: Left Arm, Cuff Size: Normal)   Pulse (!) 55   Ht _0  (1.575 m)   Wt 141 lb 2 oz (64 kg)   SpO2 95%   BMI 25.81 kg/m    Physical Exam:  General- No distress,  A&Ox3, flat affect ENT: No sinus tenderness, TM clear, pale nasal mucosa, no oral exudate,no post nasal drip, no LAN Cardiac: S1, S2, regular rate and rhythm, no murmur Chest: No wheeze/ rales/ dullness; no accessory muscle use, no nasal flaring, no sternal retractions, diminished per bases Abd.: Soft Non-tender, non-distended Ext: No clubbing cyanosis, edema Neuro:  normal strength, MAE x 4, A&O x 3 Skin: No rashes, warm and dry, no lesions Psych:Flat affect, makes no eye contact  On exam, patient is in no respiratory distress. She has no wheezing, she has no cough today in the office.   Assessment/Plan  COPD, very severe ([place]) Resolved flare Afebrile Insists on a maintenance inhaler she can use twice daily. Plan: Please stop smoking cigarettes. Continuing to smoke increases your risk of lung cancer , heart disease and stroke. Start Symbicort  2 puffs twice daily for your COPD. Do not use Stialto or Trelegy Rinse mouth after use Follow up in 1 month with Dr. Ashok Cordia or NP to ensure you are doing well on Symbicort inhaler  Please contact office for sooner follow up if symptoms do not improve or worsen or seek emergency care     Tobacco use disorder Patient continues to smoke despite COPD flares Currently resolving, no fever, clinically improved Plan Please stop smoking cigarettes. Continuing to smoke increases your risk of lung cancer , heart disease and stroke. Follow up in 1 month with Dr. Ashok Cordia or NP to ensure you are doing well on Symbicort inhaler  Please contact office for sooner follow up if symptoms do not improve or worsen or seek  emergency care       Magdalen Spatz, NP 09/14/2017  5:11 PM

## 2017-09-14 NOTE — Assessment & Plan Note (Signed)
Patient continues to smoke despite COPD flares Currently resolving, no fever, clinically improved Plan Please stop smoking cigarettes. Continuing to smoke increases your risk of lung cancer , heart disease and stroke. Follow up in 1 month with Dr. Ashok Cordia or NP to ensure you are doing well on Symbicort inhaler  Please contact office for sooner follow up if symptoms do not improve or worsen or seek emergency care

## 2017-09-14 NOTE — Assessment & Plan Note (Addendum)
Resolved flare Afebrile Insists on a maintenance inhaler she can use twice daily. Plan: Please stop smoking cigarettes. Continuing to smoke increases your risk of lung cancer , heart disease and stroke. Start Symbicort  2 puffs twice daily for your COPD. Do not use Stialto or Trelegy Rinse mouth after use Follow up in 1 month with Dr. Ashok Cordia or NP to ensure you are doing well on Symbicort inhaler  Please contact office for sooner follow up if symptoms do not improve or worsen or seek emergency care

## 2017-09-16 ENCOUNTER — Ambulatory Visit: Payer: Self-pay | Admitting: Pulmonary Disease

## 2017-09-16 ENCOUNTER — Encounter: Payer: Medicaid Other | Admitting: Family Medicine

## 2017-09-16 VITALS — BP 141/61 | HR 60 | Temp 98.4°F | Resp 16 | Ht 63.0 in | Wt 142.0 lb

## 2017-09-18 ENCOUNTER — Encounter: Payer: Self-pay | Admitting: Family Medicine

## 2017-09-18 NOTE — Progress Notes (Signed)
Send guidewire a 51 year old female presented to establish care.  After speaking with intake RN patient decided that clinic was not a good fit. Patient was not evaluated.     Donia Pounds  MSN, FNP-C [facility] [address] [place], [place] [address] [phone]

## 2017-09-22 ENCOUNTER — Other Ambulatory Visit: Payer: Self-pay | Admitting: Allergy

## 2017-09-22 DIAGNOSIS — J4550 Severe persistent asthma, uncomplicated: Secondary | ICD-10-CM

## 2017-09-30 ENCOUNTER — Ambulatory Visit: Payer: Self-pay | Admitting: Family Medicine

## 2017-10-07 ENCOUNTER — Other Ambulatory Visit: Payer: Self-pay | Admitting: Allergy

## 2017-10-22 ENCOUNTER — Ambulatory Visit: Payer: Self-pay | Admitting: Emergency Medicine

## 2017-11-08 ENCOUNTER — Emergency Department (HOSPITAL_COMMUNITY)
Admission: EM | Admit: 2017-11-08 | Discharge: 2017-11-09 | Disposition: A | Discharge status: Home / Self Care | Payer: Medicaid Other | Attending: Emergency Medicine | Admitting: Emergency Medicine

## 2017-11-08 ENCOUNTER — Emergency Department (HOSPITAL_COMMUNITY): Payer: Medicaid Other

## 2017-11-08 ENCOUNTER — Encounter (HOSPITAL_COMMUNITY): Payer: Self-pay | Admitting: Oncology

## 2017-11-08 DIAGNOSIS — Y999 Unspecified external cause status: Secondary | ICD-10-CM | POA: Insufficient documentation

## 2017-11-08 DIAGNOSIS — Y939 Activity, unspecified: Secondary | ICD-10-CM | POA: Insufficient documentation

## 2017-11-08 DIAGNOSIS — Y929 Unspecified place or not applicable: Secondary | ICD-10-CM | POA: Diagnosis not present

## 2017-11-08 DIAGNOSIS — I10 Essential (primary) hypertension: Secondary | ICD-10-CM | POA: Diagnosis not present

## 2017-11-08 DIAGNOSIS — M542 Cervicalgia: Secondary | ICD-10-CM | POA: Insufficient documentation

## 2017-11-08 DIAGNOSIS — S060X1A Concussion with loss of consciousness of 30 minutes or less, initial encounter: Secondary | ICD-10-CM

## 2017-11-08 DIAGNOSIS — J45909 Unspecified asthma, uncomplicated: Secondary | ICD-10-CM | POA: Insufficient documentation

## 2017-11-08 DIAGNOSIS — M25531 Pain in right wrist: Secondary | ICD-10-CM | POA: Diagnosis not present

## 2017-11-08 DIAGNOSIS — Z79899 Other long term (current) drug therapy: Secondary | ICD-10-CM | POA: Insufficient documentation

## 2017-11-08 DIAGNOSIS — F17210 Nicotine dependence, cigarettes, uncomplicated: Secondary | ICD-10-CM | POA: Diagnosis not present

## 2017-11-08 DIAGNOSIS — R918 Other nonspecific abnormal finding of lung field: Secondary | ICD-10-CM

## 2017-11-08 DIAGNOSIS — S3991XA Unspecified injury of abdomen, initial encounter: Secondary | ICD-10-CM | POA: Diagnosis not present

## 2017-11-08 DIAGNOSIS — S0990XA Unspecified injury of head, initial encounter: Secondary | ICD-10-CM | POA: Diagnosis present

## 2017-11-08 DIAGNOSIS — J449 Chronic obstructive pulmonary disease, unspecified: Secondary | ICD-10-CM | POA: Insufficient documentation

## 2017-11-08 DIAGNOSIS — M25512 Pain in left shoulder: Secondary | ICD-10-CM | POA: Insufficient documentation

## 2017-11-08 LAB — I-STAT CHEM 8, ED
BUN: 24 mg/dL — AB (ref 6–20)
CHLORIDE: 98 mmol/L — AB (ref 101–111)
Calcium, Ion: 1.21 mmol/L (ref 1.15–1.40)
Creatinine, Ser: 1.5 mg/dL — ABNORMAL HIGH (ref 0.44–1.00)
Glucose, Bld: 95 mg/dL (ref 65–99)
HEMATOCRIT: 40 % (ref 36.0–46.0)
Hemoglobin: 13.6 g/dL (ref 12.0–15.0)
POTASSIUM: 4.7 mmol/L (ref 3.5–5.1)
SODIUM: 137 mmol/L (ref 135–145)
TCO2: 28 mmol/L (ref 22–32)

## 2017-11-08 LAB — I-STAT BETA HCG BLOOD, ED (MC, WL, AP ONLY): I-stat hCG, quantitative: 5.3 m[IU]/mL — ABNORMAL HIGH (ref <5)

## 2017-11-08 MED ORDER — FENTANYL CITRATE (PF) 100 MCG/2ML IJ SOLN
50.0000 ug | Freq: Once | INTRAMUSCULAR | Status: AC
  Administered 2017-11-09: 50 ug via INTRAVENOUS
  Filled 2017-11-08: qty 2

## 2017-11-08 MED ORDER — ONDANSETRON 4 MG PO TBDP
4.0000 mg | ORAL_TABLET | Freq: Once | ORAL | Status: AC
  Administered 2017-11-09: 4 mg via ORAL
  Filled 2017-11-08: qty 1

## 2017-11-08 MED ORDER — IOPAMIDOL (ISOVUE-300) INJECTION 61%
INTRAVENOUS | Status: AC
  Filled 2017-11-08: qty 100

## 2017-11-08 MED ORDER — IOPAMIDOL (ISOVUE-300) INJECTION 61%
INTRAVENOUS | Status: AC
  Administered 2017-11-08: 75 mL
  Filled 2017-11-08: qty 75
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
## 2017-11-08 NOTE — ED Notes (Signed)
Patient transported to X-ray 

## 2017-11-09 MED ORDER — CYCLOBENZAPRINE HCL 10 MG PO TABS: 10.0000 mg | ORAL_TABLET | Freq: Two times a day (BID) | ORAL | 0 refills | Status: AC | PRN
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
## 2017-12-15 ENCOUNTER — Other Ambulatory Visit: Payer: Self-pay

## 2017-12-15 ENCOUNTER — Emergency Department (HOSPITAL_COMMUNITY): Payer: Medicaid Other

## 2017-12-15 ENCOUNTER — Emergency Department (HOSPITAL_COMMUNITY)
Admission: EM | Admit: 2017-12-15 | Discharge: 2017-12-16 | Disposition: A | Discharge status: Home / Self Care | Payer: Medicaid Other | Attending: Emergency Medicine | Admitting: Emergency Medicine

## 2017-12-15 ENCOUNTER — Encounter (HOSPITAL_COMMUNITY): Payer: Self-pay

## 2017-12-15 DIAGNOSIS — F312 Bipolar disorder, current episode manic severe with psychotic features: Secondary | ICD-10-CM | POA: Diagnosis not present

## 2017-12-15 DIAGNOSIS — R740 Nonspecific elevation of levels of transaminase and lactic acid dehydrogenase [LDH]: Secondary | ICD-10-CM | POA: Insufficient documentation

## 2017-12-15 DIAGNOSIS — Z8673 Personal history of transient ischemic attack (TIA), and cerebral infarction without residual deficits: Secondary | ICD-10-CM | POA: Insufficient documentation

## 2017-12-15 DIAGNOSIS — F319 Bipolar disorder, unspecified: Secondary | ICD-10-CM | POA: Diagnosis not present

## 2017-12-15 DIAGNOSIS — R1011 Right upper quadrant pain: Secondary | ICD-10-CM | POA: Diagnosis not present

## 2017-12-15 DIAGNOSIS — R200 Anesthesia of skin: Secondary | ICD-10-CM | POA: Diagnosis not present

## 2017-12-15 DIAGNOSIS — R45850 Homicidal ideations: Secondary | ICD-10-CM | POA: Diagnosis not present

## 2017-12-15 DIAGNOSIS — R1084 Generalized abdominal pain: Secondary | ICD-10-CM

## 2017-12-15 DIAGNOSIS — R7401 Elevation of levels of liver transaminase levels: Secondary | ICD-10-CM

## 2017-12-15 DIAGNOSIS — J45909 Unspecified asthma, uncomplicated: Secondary | ICD-10-CM | POA: Diagnosis not present

## 2017-12-15 DIAGNOSIS — F332 Major depressive disorder, recurrent severe without psychotic features: Secondary | ICD-10-CM | POA: Diagnosis present

## 2017-12-15 DIAGNOSIS — R110 Nausea: Secondary | ICD-10-CM | POA: Insufficient documentation

## 2017-12-15 DIAGNOSIS — R1013 Epigastric pain: Secondary | ICD-10-CM | POA: Insufficient documentation

## 2017-12-15 DIAGNOSIS — F17210 Nicotine dependence, cigarettes, uncomplicated: Secondary | ICD-10-CM | POA: Insufficient documentation

## 2017-12-15 DIAGNOSIS — J449 Chronic obstructive pulmonary disease, unspecified: Secondary | ICD-10-CM | POA: Diagnosis not present

## 2017-12-15 DIAGNOSIS — R1033 Periumbilical pain: Secondary | ICD-10-CM | POA: Diagnosis not present

## 2017-12-15 HISTORY — DX: Cerebral infarction, unspecified: I63.9

## 2017-12-15 LAB — CBC
HEMATOCRIT: 41.5 % (ref 36.0–46.0)
HEMOGLOBIN: 13.3 g/dL (ref 12.0–15.0)
MCH: 30.4 pg (ref 26.0–34.0)
MCHC: 32 g/dL (ref 30.0–36.0)
MCV: 94.7 fL (ref 78.0–100.0)
PLATELETS: 356 10*3/uL (ref 150–400)
RBC: 4.38 MIL/uL (ref 3.87–5.11)
RDW: 13.7 % (ref 11.5–15.5)
WBC: 9.6 10*3/uL (ref 4.0–10.5)

## 2017-12-15 LAB — COMPREHENSIVE METABOLIC PANEL
ALT: 94 U/L — AB (ref 14–54)
AST: 61 U/L — ABNORMAL HIGH (ref 15–41)
Albumin: 4.6 g/dL (ref 3.5–5.0)
Alkaline Phosphatase: 132 U/L — ABNORMAL HIGH (ref 38–126)
Anion gap: 9 (ref 5–15)
BUN: 20 mg/dL (ref 6–20)
CHLORIDE: 101 mmol/L (ref 101–111)
CO2: 26 mmol/L (ref 22–32)
CREATININE: 1.49 mg/dL — AB (ref 0.44–1.00)
Calcium: 9.8 mg/dL (ref 8.9–10.3)
GFR calc non Af Amer: 39 mL/min — ABNORMAL LOW (ref >60)
GFR, EST AFRICAN AMERICAN: 46 mL/min — AB (ref >60)
Glucose, Bld: 85 mg/dL (ref 65–99)
Potassium: 4.2 mmol/L (ref 3.5–5.1)
SODIUM: 136 mmol/L (ref 135–145)
Total Bilirubin: 0.2 mg/dL — ABNORMAL LOW (ref 0.3–1.2)
Total Protein: 7.9 g/dL (ref 6.5–8.1)

## 2017-12-15 LAB — URINALYSIS, ROUTINE W REFLEX MICROSCOPIC
BACTERIA UA: NONE SEEN
BILIRUBIN URINE: NEGATIVE
Glucose, UA: NEGATIVE mg/dL
Hgb urine dipstick: NEGATIVE
KETONES UR: NEGATIVE mg/dL
Nitrite: NEGATIVE
PROTEIN: NEGATIVE mg/dL
Specific Gravity, Urine: 1.017 (ref 1.005–1.030)
pH: 6 (ref 5.0–8.0)

## 2017-12-15 LAB — RAPID URINE DRUG SCREEN, HOSP PERFORMED
Amphetamines: NOT DETECTED
BARBITURATES: NOT DETECTED
Benzodiazepines: NOT DETECTED
Cocaine: NOT DETECTED
Opiates: NOT DETECTED
Tetrahydrocannabinol: NOT DETECTED

## 2017-12-15 LAB — I-STAT BETA HCG BLOOD, ED (MC, WL, AP ONLY)

## 2017-12-15 LAB — LIPASE, BLOOD: LIPASE: 30 U/L (ref 11–51)

## 2017-12-15 MED ORDER — SODIUM CHLORIDE 0.9 % IV BOLUS (SEPSIS)
1000.0000 mL | Freq: Once | INTRAVENOUS | Status: AC
  Administered 2017-12-15: 1000 mL via INTRAVENOUS

## 2017-12-15 MED ORDER — ONDANSETRON 4 MG PO TBDP: 4.0000 mg | ORAL_TABLET | Freq: Three times a day (TID) | ORAL | 0 refills | Status: AC | PRN

## 2017-12-15 MED ORDER — ONDANSETRON 4 MG PO TBDP
4.0000 mg | ORAL_TABLET | Freq: Once | ORAL | Status: AC | PRN
  Administered 2017-12-15: 4 mg via ORAL
  Filled 2017-12-15: qty 1
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
## 2017-12-16 MED ORDER — HYDROXYZINE HCL 25 MG PO TABS: 50.0000 mg | ORAL_TABLET | Freq: Three times a day (TID) | ORAL | Status: DC | PRN

## 2017-12-16 MED ORDER — TIOTROPIUM BROMIDE MONOHYDRATE 18 MCG IN CAPS
18.0000 ug | ORAL_CAPSULE | Freq: Every day | RESPIRATORY_TRACT | Status: DC
  Filled 2017-12-16: qty 5

## 2017-12-16 MED ORDER — METOPROLOL TARTRATE 25 MG PO TABS
100.0000 mg | ORAL_TABLET | Freq: Two times a day (BID) | ORAL | Status: DC
  Administered 2017-12-16 (×2): 100 mg via ORAL
  Filled 2017-12-16 (×2): qty 4

## 2017-12-16 MED ORDER — TAPENTADOL HCL 50 MG PO TABS: 25.0000 mg | ORAL_TABLET | Freq: Three times a day (TID) | ORAL | Status: DC | PRN

## 2017-12-16 MED ORDER — ESCITALOPRAM OXALATE 10 MG PO TABS
10.0000 mg | ORAL_TABLET | Freq: Every day | ORAL | Status: DC
  Administered 2017-12-16: 10 mg via ORAL
  Filled 2017-12-16: qty 1

## 2017-12-16 MED ORDER — ALBUTEROL SULFATE HFA 108 (90 BASE) MCG/ACT IN AERS
2.0000 | INHALATION_SPRAY | Freq: Four times a day (QID) | RESPIRATORY_TRACT | Status: DC | PRN
  Administered 2017-12-16: 2 via RESPIRATORY_TRACT
  Filled 2017-12-16: qty 6.7

## 2017-12-16 MED ORDER — LEVETIRACETAM 500 MG PO TABS
1000.0000 mg | ORAL_TABLET | Freq: Two times a day (BID) | ORAL | Status: DC
Start: 2017-12-16 — End: 2017-12-16
  Administered 2017-12-16 (×2): 1000 mg via ORAL
  Filled 2017-12-16 (×2): qty 2

## 2017-12-16 MED ORDER — QUETIAPINE FUMARATE 300 MG PO TABS
300.0000 mg | ORAL_TABLET | Freq: Every day | ORAL | Status: DC
  Administered 2017-12-16: 300 mg via ORAL
  Filled 2017-12-16: qty 1

## 2017-12-16 MED ORDER — ONDANSETRON 4 MG PO TBDP: 4.0000 mg | ORAL_TABLET | Freq: Three times a day (TID) | ORAL | Status: DC | PRN

## 2017-12-16 MED ORDER — ROSUVASTATIN CALCIUM 20 MG PO TABS: 20.0000 mg | ORAL_TABLET | Freq: Every day | ORAL | Status: DC

## 2017-12-16 MED ORDER — HYDRALAZINE HCL 50 MG PO TABS
100.0000 mg | ORAL_TABLET | Freq: Every day | ORAL | Status: DC
  Administered 2017-12-16: 100 mg via ORAL
  Filled 2017-12-16: qty 2

## 2017-12-16 MED ORDER — GABAPENTIN 400 MG PO CAPS
800.0000 mg | ORAL_CAPSULE | Freq: Three times a day (TID) | ORAL | Status: DC
  Administered 2017-12-16: 800 mg via ORAL
  Filled 2017-12-16: qty 2

## 2017-12-16 MED ORDER — NICOTINE 21 MG/24HR TD PT24
21.0000 mg | MEDICATED_PATCH | Freq: Every day | TRANSDERMAL | Status: DC
  Administered 2017-12-16: 21 mg via TRANSDERMAL
  Filled 2017-12-16: qty 1

## 2017-12-16 NOTE — ED Notes (Signed)
Pt d/c home per MD order. Discharge summary reviewed with pt. Pt verbalizes understanding. Pt denies SI/HI/AVH. Personal property returned to pt. Pt signed e-signature. Ambulatory off unit with MHT.

## 2017-12-16 NOTE — BH Assessment (Signed)
[facility] Assessment Progress Note  Per Buford Dresser, DO, this pt does not require psychiatric hospitalization at this time.  Pt presents under IVC initiated by EDP Addison Lank, MD, which Dr Mariea Clonts has rescinded.  Pt is to be discharged from [facility] with recommendation to continue treatment with [facility].  This has been included in pt's discharge instructions.  Pt's nurse, Caryl Pina, has been notified.  Jalene Mullet, [place] Triage Specialist [phone]

## 2017-12-16 NOTE — Discharge Instructions (Signed)
Please make sure that you stay well-hydrated.  Please follow-up with your primary care doctor regarding your ED visit and symptoms today.  FOR YOUR BEHAVIORAL HEALTH NEEDS you are advised to continue treatment with [facility]:       [facility]      [address]      [place], [place] [address]      [phone]

## 2017-12-16 NOTE — ED Notes (Signed)
TTS in progress 

## 2017-12-16 NOTE — ED Notes (Signed)
Patient reports HI towards her roommate. Patient denies SI and AVH. Plan of care discussed. Encouragement and support provided and safety maintain.  Q 15 min safety checks in place and video monitoring.
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
## 2018-01-19 ENCOUNTER — Telehealth: Payer: Self-pay | Admitting: Emergency Medicine

## 2018-01-19 NOTE — Telephone Encounter (Signed)
Spoke with pt, she states she has been wheezing and went to her family doctor and she listened to her lungs. She gave her amoxicillin, prednisone  and Symbicort to take last Thursday. This morning when she woke up her lungs really hurt and she cannot take a deep breathe. I advised her to go to the ED to be evaluated.  Pt understood and will go to ED.

## 2018-01-19 NOTE — Telephone Encounter (Signed)
Advised her to call in after her ED visit to see if RB can work her in for follow up. She will call once she is released FYI RB. She wants to keep RB as her pulmonologist.

## 2018-01-21 ENCOUNTER — Ambulatory Visit: Payer: Self-pay | Admitting: Emergency Medicine

## 2018-01-27 ENCOUNTER — Encounter (HOSPITAL_COMMUNITY): Payer: Self-pay | Admitting: Emergency Medicine

## 2018-01-27 ENCOUNTER — Emergency Department (HOSPITAL_COMMUNITY)
Admission: EM | Admit: 2018-01-27 | Discharge: 2018-01-27 | Disposition: A | Discharge status: Home / Self Care | Payer: Medicaid Other | Attending: Emergency Medicine | Admitting: Emergency Medicine

## 2018-01-27 DIAGNOSIS — R42 Dizziness and giddiness: Secondary | ICD-10-CM | POA: Diagnosis not present

## 2018-01-27 DIAGNOSIS — Z5321 Procedure and treatment not carried out due to patient leaving prior to being seen by health care provider: Secondary | ICD-10-CM | POA: Diagnosis not present

## 2018-01-27 DIAGNOSIS — R51 Headache: Secondary | ICD-10-CM | POA: Insufficient documentation

## 2018-01-27 LAB — CBG MONITORING, ED: GLUCOSE-CAPILLARY: 101 mg/dL — AB (ref 65–99)

## 2018-01-27 LAB — CBC
HEMATOCRIT: 42 % (ref 36.0–46.0)
Hemoglobin: 13.9 g/dL (ref 12.0–15.0)
MCH: 30.7 pg (ref 26.0–34.0)
MCHC: 33.1 g/dL (ref 30.0–36.0)
MCV: 92.7 fL (ref 78.0–100.0)
Platelets: 300 10*3/uL (ref 150–400)
RBC: 4.53 MIL/uL (ref 3.87–5.11)
RDW: 13.8 % (ref 11.5–15.5)
WBC: 9.7 10*3/uL (ref 4.0–10.5)

## 2018-01-27 LAB — URINALYSIS, ROUTINE W REFLEX MICROSCOPIC
Glucose, UA: NEGATIVE mg/dL
Hgb urine dipstick: NEGATIVE
KETONES UR: NEGATIVE mg/dL
Nitrite: NEGATIVE
PROTEIN: 100 mg/dL — AB
Specific Gravity, Urine: 1.043 — ABNORMAL HIGH (ref 1.005–1.030)
pH: 5 (ref 5.0–8.0)

## 2018-01-27 LAB — BASIC METABOLIC PANEL
Anion gap: 15 (ref 5–15)
BUN: 17 mg/dL (ref 6–20)
CHLORIDE: 103 mmol/L (ref 101–111)
CO2: 23 mmol/L (ref 22–32)
Calcium: 9.8 mg/dL (ref 8.9–10.3)
Creatinine, Ser: 1.28 mg/dL — ABNORMAL HIGH (ref 0.44–1.00)
GFR calc Af Amer: 55 mL/min — ABNORMAL LOW (ref >60)
GFR calc non Af Amer: 47 mL/min — ABNORMAL LOW (ref >60)
GLUCOSE: 99 mg/dL (ref 65–99)
POTASSIUM: 3.7 mmol/L (ref 3.5–5.1)
Sodium: 141 mmol/L (ref 135–145)

## 2018-01-27 LAB — I-STAT BETA HCG BLOOD, ED (MC, WL, AP ONLY): I-stat hCG, quantitative: 6.4 m[IU]/mL — ABNORMAL HIGH (ref <5)

## 2018-01-27 NOTE — ED Provider Notes (Cosign Needed)
Patient placed in Quick Look pathway, seen and evaluated   Chief Complaint: dizzy  HPI:   52 y.o. female arrived via EMS with c/o dizziness when standing. Patient states she just feels "off". She c/o headache that started 3 days ago and pain to her left arm and side. She reports nausea.  ROS: GI: nausea  Neuro: dizzy  Physical Exam:   Gen: No distress  Neuro: Awake and Alert  Skin: Warm and dry  Neuro: equal grips, no pronator drift  Focused Exam:    Initiation of care has begun. The patient has been counseled on the process, plan, and necessity for staying for the completion/evaluation, and the remainder of the medical screening examination    Ashley Murrain, NP 01/27/18 [phone]

## 2018-01-27 NOTE — ED Triage Notes (Signed)
Per [facility], Pt from home. Pt complaining of dizziness when standing, states she "just feels off", headache x 3 days, pain to L arm and L side, and nausea. Pt alert and oriented x4. VSS.

## 2018-01-27 NOTE — ED Notes (Signed)
Pt called in lobby no response. RN notified

## 2018-01-27 NOTE — ED Notes (Signed)
Pt called multiple times in the waiting room for vs recheck and no response. Pt left post triage.

## 2018-01-28 ENCOUNTER — Ambulatory Visit: Payer: Self-pay | Admitting: Emergency Medicine

## 2018-02-01 ENCOUNTER — Ambulatory Visit: Payer: Medicaid Other | Admitting: Adult Health

## 2018-02-01 ENCOUNTER — Encounter: Payer: Self-pay | Admitting: Adult Health

## 2018-02-01 DIAGNOSIS — J449 Chronic obstructive pulmonary disease, unspecified: Secondary | ICD-10-CM | POA: Diagnosis not present

## 2018-02-01 DIAGNOSIS — F17210 Nicotine dependence, cigarettes, uncomplicated: Secondary | ICD-10-CM

## 2018-02-01 MED ORDER — TIOTROPIUM BROMIDE-OLODATEROL 2.5-2.5 MCG/ACT IN AERS: 2.0000 | INHALATION_SPRAY | Freq: Every day | RESPIRATORY_TRACT | 0 refills | Status: AC

## 2018-02-01 MED ORDER — TIOTROPIUM BROMIDE-OLODATEROL 2.5-2.5 MCG/ACT IN AERS: 2.0000 | INHALATION_SPRAY | Freq: Every day | RESPIRATORY_TRACT | 5 refills | Status: AC

## 2018-02-01 NOTE — Patient Instructions (Signed)
Begin Stiolto 2 puffs daily , rinse after use.  Stop Spiriva .  Work on not smoking .  Follow up with Dr. Vaughan Browner in 6 months and As needed

## 2018-02-01 NOTE — Assessment & Plan Note (Addendum)
Recent flare now resolving   Trial of LABA [place]  Patient Instructions  Begin Stiolto 2 puffs daily , rinse after use.  Stop Spiriva .  Work on not smoking .  Follow up with Dr. Vaughan Browner in 6 months and As needed

## 2018-02-01 NOTE — Progress Notes (Signed)
@Patient  ID: [name], female    DOB: 06/02/1966, 52 y.o.   MRN: [id number]  No chief complaint on file.   Referring provider: Jettie Booze, NP  HPI: 52 year old female smoker followed for GOLD IV COPD /Asthma  Past medical history significant for multi lobar pneumonia February 2018 with lingular abscess and left hydropneumothorax subsequent VATS with decortication   PFT 04/03/17: FVC 1.11 L (38%) FEV1 0.57 L (24%) FEV1/FVC 0.51 FEF 25-75 0.27 L (10%) 02/21/16: FVC 1.72 L (89%) FEV1 0.93 L (38%) FEV1/FVC 0.54 FEF 25-75 0.40 (18%) positive bronchodilator response  IMAGING CT CHEST W/ CONTRAST 09/08/17   Apical predominant emphysematous changes. Multiple old bilateral rib fractures noted. No pathologic mediastinal adenopathy. No central pulmonary emboli. No parenchymal mass or opacity appreciated. No pleural effusion or thickening appreciated.  CARDIAC TTE (12/13/16):  LV normal in size with EF 65-70%. Normal regional wall motion. Grade 2 diastolic dysfunction. LA mildly dilated & RA normal in size. RV mildly dilated with preserved systolic function. Pulmonary artery systolic pressure 44 mmHg. No aortic stenosis or regurgitation. Aortic root normal in size. Trivial mitral regurgitation without stenosis. No significant pulmonic regurgitation. Mild tricuspid regurgitation. No pericardial effusion.  LABS 03/06/14 ANA: Negative Rheumatoid factor: 7.3 ESR: 2 Alpha-1 MM   166   02/01/2018 Follow up ; COPD Patient presents for a follow-up visit.  Patient was recently treated for COPD exacerbation at the emergency room at [facility] . Was treated with steroid taper. She is feeling better.  Chest xray was neg . CT chest neg for PE . Resolved lingular bulla .  Remains on Spiriva daily .  Working on quitting smoking , cessation discussed.   Is on chronic narcotics for FM and DJD.    Allergies  Allergen Reactions  . Cymbalta [Duloxetine Hcl] Other (See Comments)    Mood  changes...mean  . Paxil [Paroxetine Hcl] Other (See Comments)    Mean and aggressive Mean and aggressive  . Wellbutrin [Bupropion] Other (See Comments)    Rapid heartrate  . Zoloft [Sertraline] Other (See Comments)    Anger Anger  . Nyquil Multi-Symptom [Pseudoeph-Doxylamine-Dm-Apap] Hives  . Tramadol Itching    Immunization History  Administered Date(s) Administered  . Influenza,inj,Quad PF,6+ Mos 08/24/2017  . Pneumococcal Polysaccharide-23 11/24/2015    Past Medical History:  Diagnosis Date  . Allergic rhinitis   . Arthritis   . Asthma   . Back pain   . Bipolar 1 disorder ([place])   . C. difficile diarrhea   . Cavitary lesion of lung   . Chronic low back pain 03/06/2014  . COPD (chronic obstructive pulmonary disease) ([place])   . Depression   . Fibroid   . Hypertension   . Hypomagnesemia   . IBS (irritable bowel syndrome)   . Migraine   . Scoliosis   . Stroke [facility])     Tobacco History: Social History   Tobacco Use  Smoking Status Current Every Day Smoker  . Packs/day: 0.75  . Years: 30.00  . Pack years: 22.50  . Types: Cigarettes  Smokeless Tobacco Never Used   Ready to quit: Not Answered Counseling given: Not Answered   Outpatient Encounter Medications as of 02/01/2018  Medication Sig  . acetaminophen (TYLENOL) 325 MG tablet Take 2 tablets (650 mg total) by mouth every 4 (four) hours as needed for mild pain (or temp > 37.5 C (99.5 F)).  Marland Kitchen albuterol (PROAIR HFA) 108 (90 Base) MCG/ACT inhaler Inhale 2 puffs into the lungs every  6 (six) hours as needed for wheezing or shortness of breath.  . escitalopram (LEXAPRO) 20 MG tablet Take 2 tablets (40 mg total) by mouth daily. (Patient taking differently: Take 10 mg by mouth daily. )  . hydrALAZINE (APRESOLINE) 100 MG tablet Take 1 tablet (100 mg total) by mouth every 6 (six) hours. (Patient taking differently: Take 100 mg by mouth daily. )  . hydrOXYzine (VISTARIL) 50 MG capsule Take 50 mg by mouth 3 (three) times  daily as needed for anxiety.   . lamoTRIgine (LAMICTAL) 100 MG tablet Take 1 tablet (100 mg total) by mouth 2 (two) times daily.  Marland Kitchen levETIRAcetam (KEPPRA) 1000 MG tablet Take 1 tablet (1,000 mg total) by mouth 2 (two) times daily.  . methocarbamol (ROBAXIN) 750 MG tablet Take 750 mg by mouth 3 (three) times daily.  . metoprolol tartrate (LOPRESSOR) 100 MG tablet Take 100 mg by mouth 2 (two) times daily.  . ondansetron (ZOFRAN ODT) 4 MG disintegrating tablet Take 1 tablet (4 mg total) by mouth every 8 (eight) hours as needed for nausea or vomiting.  Marland Kitchen QUEtiapine (SEROQUEL) 100 MG tablet Take 300 mg by mouth at bedtime.   Marland Kitchen Respiratory Therapy Supplies (FLUTTER) DEVI Use as directed  . rosuvastatin (CRESTOR) 20 MG tablet Take 20 mg by mouth daily.  . tapentadol (NUCYNTA) 50 MG tablet Take 25 mg by mouth 3 (three) times daily as needed for moderate pain.  Marland Kitchen tiotropium (SPIRIVA) 18 MCG inhalation capsule Place 18 mcg into inhaler and inhale daily.  . cyclobenzaprine (FLEXERIL) 10 MG tablet Take 1 tablet (10 mg total) by mouth 2 (two) times daily as needed for muscle spasms. (Patient not taking: Reported on 02/01/2018)  . gabapentin (NEURONTIN) 400 MG capsule Take 1 capsule (400 mg total) by mouth 3 (three) times daily. (Patient not taking: Reported on 02/01/2018)  . meloxicam (MOBIC) 15 MG tablet Take 15 mg by mouth daily.  . [DISCONTINUED] predniSONE (DELTASONE) 20 MG tablet Take 2 tablets (40 mg total) by mouth daily with breakfast. (Patient not taking: Reported on 02/01/2018)  . [DISCONTINUED] Tiotropium Bromide-Olodaterol (STIOLTO RESPIMAT) 2.5-2.5 MCG/ACT AERS Inhale 2 puffs into the lungs daily. (Patient not taking: Reported on 02/01/2018)   No facility-administered encounter medications on file as of 02/01/2018.      Review of Systems  Constitutional:   No  weight loss, night sweats,  Fevers, chills,  +fatigue, or  lassitude.  HEENT:   No headaches,  Difficulty swallowing,  Tooth/dental  problems, or  Sore throat,                No sneezing, itching, ear ache, nasal congestion, post nasal drip,   CV:  No chest pain,  Orthopnea, PND, swelling in lower extremities, anasarca, dizziness, palpitations, syncope.   GI  No heartburn, indigestion, abdominal pain, nausea, vomiting, diarrhea, change in bowel habits, loss of appetite, bloody stools.   Resp:    No chest wall deformity  Skin: no rash or lesions.  GU: no dysuria, change in color of urine, no urgency or frequency.  No flank pain, no hematuria   MS:  No joint pain or swelling.  No decreased range of motion.  No back pain. + joint pains.      Physical Exam  BP 124/76 (BP Location: Left Arm, Cuff Size: Normal)   Pulse (!) 59   Temp 98.2 F (36.8 C) (Oral)   Ht 5' 3"  (1.6 m)   Wt 136 lb 9.6 oz (62 kg)  SpO2 97%   BMI 24.20 kg/m   GEN: A/Ox3; pleasant , NAD    HEENT:  [place]/AT,  EACs-clear, TMs-wnl, NOSE-clear, THROAT-clear, no lesions, no postnasal drip or exudate noted. Poor dentition   NECK:  Supple w/ fair ROM; no JVD; normal carotid impulses w/o bruits; no thyromegaly or nodules palpated; no lymphadenopathy.    RESP  Clear  P & A; w/o, wheezes/ rales/ or rhonchi. no accessory muscle use, no dullness to percussion  CARD:  RRR, no m/r/g, no peripheral edema, pulses intact, no cyanosis or clubbing.  GI:   Soft & nt; nml bowel sounds; no organomegaly or masses detected.   Musco: Warm bil, no deformities or joint swelling noted.   Neuro: alert, no focal deficits noted.    Skin: Warm, no lesions or rashes    Lab Results:  CBC    Component Value Date/Time   WBC 9.7 01/27/2018 1826   RBC 4.53 01/27/2018 1826   HGB 13.9 01/27/2018 1826   HGB 12.9 07/25/2016 1201   HCT 42.0 01/27/2018 1826   HCT 35.1 01/18/2017 0812   PLT 300 01/27/2018 1826   PLT 324 07/25/2016 1201   MCV 92.7 01/27/2018 1826   MCV 90 07/25/2016 1201   MCH 30.7 01/27/2018 1826   MCHC 33.1 01/27/2018 1826   RDW 13.8 01/27/2018  1826   RDW 14.8 07/25/2016 1201   LYMPHSABS 2.1 05/16/2017 1622   LYMPHSABS 1.4 07/25/2016 1201   MONOABS 1.5 (H) 05/16/2017 1622   EOSABS 0.2 05/16/2017 1622   EOSABS 0.2 07/25/2016 1201   BASOSABS 0.1 05/16/2017 1622   BASOSABS 0.1 07/25/2016 1201    BMET    Component Value Date/Time   NA 141 01/27/2018 1826   K 3.7 01/27/2018 1826   CL 103 01/27/2018 1826   CO2 23 01/27/2018 1826   GLUCOSE 99 01/27/2018 1826   BUN 17 01/27/2018 1826   CREATININE 1.28 (H) 01/27/2018 1826   CALCIUM 9.8 01/27/2018 1826   GFRNONAA 47 (L) 01/27/2018 1826   GFRAA 55 (L) 01/27/2018 1826    BNP    Component Value Date/Time   BNP 571.9 (H) 12/12/2015 1640    ProBNP No results found for: PROBNP  Imaging: No results found.   Assessment & Plan:   COPD, very severe ([place]) Recent flare now resolving   Trial of LABA [place]   Plan  Patient Instructions  Begin Stiolto 2 puffs daily , rinse after use.  Stop Spiriva .  Work on not smoking .  Follow up with Dr. Vaughan Browner in 6 months and As needed        Cigarette nicotine dependence without complication Smoking cessation      Rexene Edison, NP 02/01/2018

## 2018-02-01 NOTE — Assessment & Plan Note (Signed)
Smoking cessation  

## 2018-02-01 NOTE — Addendum Note (Signed)
Addended by: Parke Poisson E on: 02/01/2018 12:20 PM   Modules accepted: Orders

## 2018-02-01 NOTE — Progress Notes (Signed)
Patient seen in the office today and instructed on use of Stiolto.  Patient expressed understanding and demonstrated technique. Parke Poisson, [facility] 02/01/18

## 2018-02-04 ENCOUNTER — Telehealth: Payer: Self-pay | Admitting: Emergency Medicine

## 2018-02-04 DIAGNOSIS — J449 Chronic obstructive pulmonary disease, unspecified: Secondary | ICD-10-CM

## 2018-02-04 MED ORDER — ALBUTEROL SULFATE (2.5 MG/3ML) 0.083% IN NEBU: 2.5000 mg | INHALATION_SOLUTION | RESPIRATORY_TRACT | 5 refills | Status: AC | PRN

## 2018-02-04 MED ORDER — PREDNISONE 10 MG PO TABS: ORAL_TABLET | ORAL | 0 refills | Status: AC

## 2018-02-04 NOTE — Telephone Encounter (Signed)
Patient was seen by TP on 02/01/18 and was switched from Spiriva to [employer]. She stated that the Stiolto is not helping her at all. She is continuing to have SOB and the Stiolto does not seem to last all day. She wants to be prescribed something else.   Wishes to use [facility].   TP, please advise. Thanks!

## 2018-02-04 NOTE — Telephone Encounter (Signed)
Recent ER w/up neg CT chest for PE . She has very severe COPD , +smoker .  Would stay on Stiolto 2 puffs daily  Does she has nebulizer , can add Albuterol Nebs every 4hr as needed for wheezing /shortness of breath  Prednisone taper Prednisone 10mg   4 tabs for 2 days, then 3 tabs for 2 days, 2 tabs for 2 days, then 1 tab for 2 days, then stop  #20 . No refills  If not better or worse  Needs ov for evaluation ,  Please contact office for sooner follow up if symptoms do not improve or worsen or seek emergency care

## 2018-02-04 NOTE — Telephone Encounter (Signed)
Pt is returning call. Cb is [phone].

## 2018-02-04 NOTE — Telephone Encounter (Signed)
Spoke with patient. She is aware of TP's recs. Will order the neb solution, neb machine and prednisone taper for her. She is currently not established with a DME company.   Nothing else needed at time of call.

## 2018-02-04 NOTE — Telephone Encounter (Signed)
Left message for patient to call back  

## 2018-02-18 ENCOUNTER — Encounter (HOSPITAL_BASED_OUTPATIENT_CLINIC_OR_DEPARTMENT_OTHER): Payer: Self-pay

## 2018-02-18 ENCOUNTER — Emergency Department (HOSPITAL_BASED_OUTPATIENT_CLINIC_OR_DEPARTMENT_OTHER): Payer: Medicaid Other

## 2018-02-18 ENCOUNTER — Emergency Department (HOSPITAL_BASED_OUTPATIENT_CLINIC_OR_DEPARTMENT_OTHER)
Admission: EM | Admit: 2018-02-18 | Discharge: 2018-02-19 | Disposition: A | Discharge status: Home / Self Care | Payer: Medicaid Other | Attending: Emergency Medicine | Admitting: Emergency Medicine

## 2018-02-18 ENCOUNTER — Other Ambulatory Visit: Payer: Self-pay

## 2018-02-18 DIAGNOSIS — I10 Essential (primary) hypertension: Secondary | ICD-10-CM | POA: Diagnosis not present

## 2018-02-18 DIAGNOSIS — Z8673 Personal history of transient ischemic attack (TIA), and cerebral infarction without residual deficits: Secondary | ICD-10-CM | POA: Diagnosis not present

## 2018-02-18 DIAGNOSIS — F17210 Nicotine dependence, cigarettes, uncomplicated: Secondary | ICD-10-CM | POA: Diagnosis not present

## 2018-02-18 DIAGNOSIS — R110 Nausea: Secondary | ICD-10-CM | POA: Insufficient documentation

## 2018-02-18 DIAGNOSIS — J449 Chronic obstructive pulmonary disease, unspecified: Secondary | ICD-10-CM | POA: Insufficient documentation

## 2018-02-18 DIAGNOSIS — J45909 Unspecified asthma, uncomplicated: Secondary | ICD-10-CM | POA: Diagnosis not present

## 2018-02-18 DIAGNOSIS — R1013 Epigastric pain: Secondary | ICD-10-CM | POA: Insufficient documentation

## 2018-02-18 DIAGNOSIS — Z79899 Other long term (current) drug therapy: Secondary | ICD-10-CM | POA: Diagnosis not present

## 2018-02-18 DIAGNOSIS — M545 Low back pain: Secondary | ICD-10-CM | POA: Diagnosis not present

## 2018-02-18 DIAGNOSIS — G8929 Other chronic pain: Secondary | ICD-10-CM | POA: Insufficient documentation

## 2018-02-18 HISTORY — DX: Pneumonia, unspecified organism: J18.9

## 2018-02-18 LAB — URINALYSIS, ROUTINE W REFLEX MICROSCOPIC
Bilirubin Urine: NEGATIVE
GLUCOSE, UA: NEGATIVE mg/dL
Hgb urine dipstick: NEGATIVE
KETONES UR: NEGATIVE mg/dL
NITRITE: NEGATIVE
PROTEIN: NEGATIVE mg/dL
Specific Gravity, Urine: 1.01 (ref 1.005–1.030)
pH: 6 (ref 5.0–8.0)

## 2018-02-18 LAB — COMPREHENSIVE METABOLIC PANEL
ALK PHOS: 71 U/L (ref 38–126)
ALT: 14 U/L (ref 14–54)
AST: 19 U/L (ref 15–41)
Albumin: 4.1 g/dL (ref 3.5–5.0)
Anion gap: 10 (ref 5–15)
BILIRUBIN TOTAL: 0.3 mg/dL (ref 0.3–1.2)
BUN: 15 mg/dL (ref 6–20)
CO2: 25 mmol/L (ref 22–32)
CREATININE: 1.13 mg/dL — AB (ref 0.44–1.00)
Calcium: 9.4 mg/dL (ref 8.9–10.3)
Chloride: 101 mmol/L (ref 101–111)
GFR calc Af Amer: >60 mL/min (ref >60)
GFR calc non Af Amer: 54 mL/min — ABNORMAL LOW (ref >60)
Glucose, Bld: 88 mg/dL (ref 65–99)
Potassium: 3.8 mmol/L (ref 3.5–5.1)
Sodium: 136 mmol/L (ref 135–145)
TOTAL PROTEIN: 6.8 g/dL (ref 6.5–8.1)

## 2018-02-18 LAB — LIPASE, BLOOD: Lipase: 33 U/L (ref 11–51)

## 2018-02-18 LAB — CBC
HCT: 41 % (ref 36.0–46.0)
Hemoglobin: 13.4 g/dL (ref 12.0–15.0)
MCH: 30.7 pg (ref 26.0–34.0)
MCHC: 32.7 g/dL (ref 30.0–36.0)
MCV: 93.8 fL (ref 78.0–100.0)
PLATELETS: 316 10*3/uL (ref 150–400)
RBC: 4.37 MIL/uL (ref 3.87–5.11)
RDW: 14.7 % (ref 11.5–15.5)
WBC: 9 10*3/uL (ref 4.0–10.5)

## 2018-02-18 LAB — URINALYSIS, MICROSCOPIC (REFLEX)

## 2018-02-18 MED ORDER — ONDANSETRON HCL 4 MG/2ML IJ SOLN
4.0000 mg | Freq: Once | INTRAMUSCULAR | Status: AC
  Administered 2018-02-18: 4 mg via INTRAVENOUS
  Filled 2018-02-18: qty 2

## 2018-02-18 MED ORDER — MORPHINE SULFATE (PF) 4 MG/ML IV SOLN
4.0000 mg | Freq: Once | INTRAVENOUS | Status: AC
  Administered 2018-02-18: 4 mg via INTRAVENOUS
  Filled 2018-02-18: qty 1

## 2018-02-18 MED ORDER — PANTOPRAZOLE SODIUM 40 MG IV SOLR
40.0000 mg | Freq: Once | INTRAVENOUS | Status: AC
  Administered 2018-02-19: 40 mg via INTRAVENOUS
  Filled 2018-02-18: qty 40

## 2018-02-18 MED ORDER — FAMOTIDINE 20 MG PO TABS: 40.0000 mg | ORAL_TABLET | Freq: Once | ORAL | Status: DC

## 2018-02-18 MED ORDER — METOCLOPRAMIDE HCL 5 MG/ML IJ SOLN
10.0000 mg | Freq: Once | INTRAMUSCULAR | Status: AC
  Administered 2018-02-18: 10 mg via INTRAVENOUS
  Filled 2018-02-18: qty 2

## 2018-02-18 MED ORDER — IOPAMIDOL (ISOVUE-300) INJECTION 61%
100.0000 mL | Freq: Once | INTRAVENOUS | Status: AC | PRN
  Administered 2018-02-18: 100 mL via INTRAVENOUS

## 2018-02-18 NOTE — ED Notes (Signed)
Pt returned from CT °

## 2018-02-18 NOTE — ED Triage Notes (Signed)
C/o abd pain, nausea x "weeks"-NAD-steady gait

## 2018-02-18 NOTE — ED Notes (Signed)
Patient has oral contrast and instructions on how to drink per CT tech; advised patient to let nurse know if she has any troubles drinking.

## 2018-02-18 NOTE — ED Notes (Signed)
Patient states that "my stomach is turning" states "it burns in the back of my throat"; no NVD noted at this time. Will continue to monitor.

## 2018-02-18 NOTE — ED Notes (Signed)
Patient transported to CT 

## 2018-02-18 NOTE — ED Provider Notes (Signed)
[facility] EMERGENCY DEPARTMENT Provider Note   CSN: [id number] Arrival date & time: 02/18/18  2006     History   Chief Complaint Chief Complaint  Patient presents with  . Abdominal Pain    HPI Denver S Swindell is a 52 y.o. female with history of asthma, bipolar 1 disorder, chronic low back pain, COPD, hypertension, migraine, IBS who presents with a 1-1/2-week history of epigastric, left upper quadrant pain, and associated nausea.  She has not had much of an appetite.  Her pain is not worse or better with eating.  She denies any radiation of the pain.  She denies any bloody stools or diarrhea.  In fact, she has had some intermittent constipation.  She denies any urinary symptoms, vomiting, chest pain, shortness of breath, abnormal vaginal bleeding or discharge.  Patient is status post cholecystectomy.  She did not drink alcohol, but does smoke cigarettes.  She denies fevers, however has had night sweats.  Patient has never had a colonoscopy.  HPI  Past Medical History:  Diagnosis Date  . Allergic rhinitis   . Arthritis   . Asthma   . Back pain   . Bipolar 1 disorder ([place])   . C. difficile diarrhea   . Cavitary lesion of lung   . Chronic low back pain 03/06/2014  . COPD (chronic obstructive pulmonary disease) ([place])   . Depression   . Fibroid   . Hypertension   . Hypomagnesemia   . IBS (irritable bowel syndrome)   . Migraine   . Pneumonia   . Scoliosis   . Stroke [facility])     Patient Active Problem List   Diagnosis Date Noted  . COPD, very severe ([place]) 09/09/2017  . Tobacco use disorder 09/09/2017  . Acute bronchitis with COPD ([place]) 08/31/2017  . CVA (cerebral vascular accident) ([place]) 02/07/2017  . Palliative care by specialist   . Goals of care, counseling/discussion   . Bipolar I disorder ([place])   . Chronic pain syndrome   . PRES (posterior reversible encephalopathy syndrome)   . Acute encephalopathy   . Visual disturbance   . Seizure ([place]) 01/15/2017  .  Hypokalemia 01/15/2017  . Renal insufficiency 01/15/2017  . Hyperglycemia 01/15/2017  . H/O chest tube placement   . Candida infection   . Coagulase-negative staphylococcal infection   . Asthma 01/03/2017  . Seasonal allergies 01/03/2017  . Anemia 01/03/2017  . Herpes labialis 01/03/2017  . Ileus ([place])   . Pneumothorax   . Pneumonia of left upper lobe due to Streptococcus pneumoniae ([place])   . Clostridium difficile colitis   . Transaminitis   . Pressure injury of skin 12/19/2016  . 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 IBS (irritable bowel syndrome) 12/13/2015  . Essential hypertension   . Encephalopathy acute 12/03/2015  . Chronic narcotic dependence ([place]) 11/22/2015  . Hypercalcemia 11/22/2015  . Chronic hyponatremia 03/07/2015  . Cigarette nicotine dependence without complication 75/08/2584  . Fibromyalgia syndrome 01/09/2015  . Mixed hyperlipidemia 01/09/2015  . Chronic low back pain 03/06/2014    Past Surgical History:  Procedure Laterality Date  . CHEST TUBE INSERTION    . CHOLECYSTECTOMY    . DILATION AND CURETTAGE OF UTERUS    . EXPLORATORY LAPAROTOMY     WHEN PATIENT WAS YOUNG  . RIGHT FOOT SURGERY    . TUBAL LIGATION Bilateral   . VIDEO ASSISTED THORACOSCOPY (VATS)/DECORTICATION Left 12/26/2016   Procedure: LEFT VIDEO ASSISTED THORACOSCOPY (VATS)/DECORTICATION;  Surgeon: Collier Salina  Prescott Gum, MD;  Location: [facility];  Service: Thoracic;  Laterality: Left;  Marland Kitchen VIDEO BRONCHOSCOPY N/A 12/26/2016   Procedure: VIDEO BRONCHOSCOPY WITH BRONCHIAL LAVAGE;  Surgeon: Ivin Poot, MD;  Location: [facility] OR;  Service: Thoracic;  Laterality: N/A;     OB History   None      Home Medications    Prior to Admission medications   Medication Sig Start Date End Date Taking? Authorizing Provider  acetaminophen (TYLENOL) 325 MG tablet Take 2 tablets (650 mg total) by mouth every 4 (four) hours as needed for mild pain (or temp > 37.5 C (99.5  F)). 02/10/17   Velvet Bathe, MD  albuterol (PROAIR HFA) 108 (90 Base) MCG/ACT inhaler Inhale 2 puffs into the lungs every 6 (six) hours as needed for wheezing or shortness of breath. 04/03/17   Kennith Gain, MD  albuterol (PROVENTIL) (2.5 MG/3ML) 0.083% nebulizer solution Take 3 mLs (2.5 mg total) by nebulization every 4 (four) hours as needed for wheezing or shortness of breath. 02/04/18   Parrett, Fonnie Mu, NP  cephALEXin (KEFLEX) 500 MG capsule Take 1 capsule (500 mg total) by mouth 2 (two) times daily. 02/19/18   Froylan Hobby, Bea Graff, PA-C  cyclobenzaprine (FLEXERIL) 10 MG tablet Take 1 tablet (10 mg total) by mouth 2 (two) times daily as needed for muscle spasms. Patient not taking: Reported on 02/01/2018 11/09/17   Maczis, Barth Kirks, PA-C  escitalopram (LEXAPRO) 20 MG tablet Take 2 tablets (40 mg total) by mouth daily. Patient taking differently: Take 10 mg by mouth daily.  02/10/17   Velvet Bathe, MD  gabapentin (NEURONTIN) 400 MG capsule Take 1 capsule (400 mg total) by mouth 3 (three) times daily. Patient not taking: Reported on 02/01/2018 02/10/17   Velvet Bathe, MD  hydrALAZINE (APRESOLINE) 100 MG tablet Take 1 tablet (100 mg total) by mouth every 6 (six) hours. Patient taking differently: Take 100 mg by mouth daily.  02/10/17   Velvet Bathe, MD  hydrOXYzine (VISTARIL) 50 MG capsule Take 50 mg by mouth 3 (three) times daily as needed for anxiety.     [provider]  lamoTRIgine (LAMICTAL) 100 MG tablet Take 1 tablet (100 mg total) by mouth 2 (two) times daily. 02/10/17   Velvet Bathe, MD  levETIRAcetam (KEPPRA) 1000 MG tablet Take 1 tablet (1,000 mg total) by mouth 2 (two) times daily. 02/10/17   Velvet Bathe, MD  meloxicam (MOBIC) 15 MG tablet Take 15 mg by mouth daily.    [provider]  methocarbamol (ROBAXIN) 750 MG tablet Take 750 mg by mouth 3 (three) times daily.    [provider]  metoCLOPramide (REGLAN) 10 MG tablet Take 1 tablet (10 mg total) by  mouth every 6 (six) hours. 02/19/18   Lindsey Hommel, Bea Graff, PA-C  metoprolol tartrate (LOPRESSOR) 100 MG tablet Take 100 mg by mouth 2 (two) times daily. 10/13/17   [provider]  ondansetron (ZOFRAN ODT) 4 MG disintegrating tablet Take 1 tablet (4 mg total) by mouth every 8 (eight) hours as needed for nausea or vomiting. 12/15/17   Lorin Glass, PA-C  pantoprazole (PROTONIX) 20 MG tablet Take 1 tablet (20 mg total) by mouth daily. 02/19/18   Jonerik Sliker, Bea Graff, PA-C  predniSONE (DELTASONE) 10 MG tablet Take 4 tabs for 2 days, then 3 tabs for 2 days, 2 tabs for 2 days, then 1 tab for 2 days, then stop. 02/04/18   Parrett, Fonnie Mu, NP  QUEtiapine (SEROQUEL) 100 MG tablet Take  300 mg by mouth at bedtime.     [provider]  Respiratory Therapy Supplies (FLUTTER) DEVI Use as directed 09/03/17   Tanda Rockers, MD  rosuvastatin (CRESTOR) 20 MG tablet Take 20 mg by mouth daily.    [provider]  tapentadol (NUCYNTA) 50 MG tablet Take 25 mg by mouth 3 (three) times daily as needed for moderate pain.    [provider]  tiotropium (SPIRIVA) 18 MCG inhalation capsule Place 18 mcg into inhaler and inhale daily.    [provider]  Tiotropium Bromide-Olodaterol (STIOLTO RESPIMAT) 2.5-2.5 MCG/ACT AERS Inhale 2 puffs into the lungs daily. 02/01/18   Parrett, Fonnie Mu, NP  Tiotropium Bromide-Olodaterol (STIOLTO RESPIMAT) 2.5-2.5 MCG/ACT AERS Inhale 2 puffs into the lungs daily. 02/01/18   Parrett, Fonnie Mu, NP    Family History Family History  Problem Relation Age of Onset  . Stroke Mother   . Hypertension Brother   . Hypertension Brother   . Hypertension Brother   . Allergic rhinitis Neg Hx   . Angioedema Neg Hx   . Asthma Neg Hx   . Atopy Neg Hx   . Eczema Neg Hx   . Immunodeficiency Neg Hx   . Urticaria Neg Hx     Social History Social History   Tobacco Use  . Smoking status: Current Every Day Smoker    Packs/day: 0.75    Years: 30.00    Pack  years: 22.50    Types: Cigarettes  . Smokeless tobacco: Never Used  Substance Use Topics  . Alcohol use: No  . Drug use: No     Allergies   Cymbalta [duloxetine hcl]; Paxil [paroxetine hcl]; Wellbutrin [bupropion]; Zoloft [sertraline]; Nyquil multi-symptom [pseudoeph-doxylamine-dm-apap]; and Tramadol   Review of Systems Review of Systems  Constitutional: Negative for chills, fever and unexpected weight change.  HENT: Negative for facial swelling and sore throat.   Respiratory: Negative for shortness of breath.   Cardiovascular: Negative for chest pain.  Gastrointestinal: Positive for abdominal pain, constipation and nausea. Negative for blood in stool, diarrhea and vomiting.  Genitourinary: Negative for dysuria.  Musculoskeletal: Negative for back pain.  Skin: Negative for rash and wound.  Neurological: Negative for headaches.  Psychiatric/Behavioral: The patient is not nervous/anxious.      Physical Exam Updated Vital Signs BP 113/75 (BP Location: Right Arm)   Pulse 62   Temp 98.5 F (36.9 C) (Oral)   Resp 16   Ht 5\' 3"  (1.6 m)   Wt 64.6 kg (142 lb 6.7 oz)   LMP 01/22/2014   SpO2 100%   BMI 25.23 kg/m   Physical Exam  Constitutional: She appears well-developed and well-nourished. No distress.  HENT:  Head: Normocephalic and atraumatic.  Mouth/Throat: Oropharynx is clear and moist. No oropharyngeal exudate.  Eyes: Pupils are equal, round, and reactive to light. Conjunctivae are normal. Right eye exhibits no discharge. Left eye exhibits no discharge. No scleral icterus.  Neck: Normal range of motion. Neck supple. No thyromegaly present.  Cardiovascular: Normal rate, regular rhythm, normal heart sounds and intact distal pulses. Exam reveals no gallop and no friction rub.  No murmur heard. Pulmonary/Chest: Effort normal and breath sounds normal. No stridor. No respiratory distress. She has no wheezes. She has no rales.  Abdominal: Soft. Bowel sounds are normal. She  exhibits no distension. There is tenderness in the epigastric area and left upper quadrant. There is no rebound, no guarding, no CVA tenderness, no tenderness at McBurney's point and negative Murphy's sign.  Musculoskeletal: She exhibits no edema.  Lymphadenopathy:    She has no cervical adenopathy.  Neurological: She is alert. Coordination normal.  Skin: Skin is warm and dry. No rash noted. She is not diaphoretic. No pallor.  Psychiatric: She has a normal mood and affect.  Nursing note and vitals reviewed.    ED Treatments / Results  Labs (all labs ordered are listed, but only abnormal results are displayed) Labs Reviewed  COMPREHENSIVE METABOLIC PANEL - Abnormal; Notable for the following components:      Result Value   Creatinine, Ser 1.13 (*)    GFR calc non Af Amer 54 (*)    All other components within normal limits  URINALYSIS, ROUTINE W REFLEX MICROSCOPIC - Abnormal; Notable for the following components:   Leukocytes, UA MODERATE (*)    All other components within normal limits  URINALYSIS, MICROSCOPIC (REFLEX) - Abnormal; Notable for the following components:   Bacteria, UA FEW (*)    Squamous Epithelial / LPF 0-5 (*)    All other components within normal limits  URINE CULTURE  LIPASE, BLOOD  CBC    EKG None  Radiology Ct Abdomen Pelvis W Contrast  Result Date: 02/18/2018 CLINICAL DATA:  Epigastric abdominal pain EXAM: CT ABDOMEN AND PELVIS WITH CONTRAST TECHNIQUE: Multidetector CT imaging of the abdomen and pelvis was performed using the standard protocol following bolus administration of intravenous contrast. CONTRAST:  121mL ISOVUE-300 IOPAMIDOL (ISOVUE-300) INJECTION 61% COMPARISON:  11/08/2017 FINDINGS: LOWER CHEST: No basilar pulmonary nodules or pleural effusion. No apical pericardial effusion. HEPATOBILIARY: Normal hepatic contours and density. No unexpected intra-or extrahepatic biliary dilatation. Status post cholecystectomy. PANCREAS: Normal parenchymal  contours without ductal dilatation. No peripancreatic fluid collection. SPLEEN: Normal. ADRENALS/URINARY TRACT: --Adrenal glands: Normal. --Right kidney/ureter: No hydronephrosis, nephroureterolithiasis, perinephric stranding or solid renal mass. --Left kidney/ureter: No hydronephrosis, nephroureterolithiasis, perinephric stranding or solid renal mass. --Urinary bladder: Normal for degree of distention STOMACH/BOWEL: --Stomach/Duodenum: No hiatal hernia or other gastric abnormality. Normal duodenal course. --Small bowel: No dilatation or inflammation. --Colon: No focal abnormality. --Appendix: Normal. VASCULAR/LYMPHATIC: Atherosclerotic calcification is present within the non-aneurysmal abdominal aorta, without hemodynamically significant stenosis. The portal vein, splenic vein, superior mesenteric vein and IVC are patent. No abdominal or pelvic lymphadenopathy. REPRODUCTIVE: Normal uterus and ovaries. MUSCULOSKELETAL. Multilevel degenerative disc disease and facet arthrosis. No bony spinal canal stenosis. OTHER: None. IMPRESSION: No acute abdominal or pelvic abnormality. Aortic Atherosclerosis (ICD10-I70.0). Electronically Signed   By: Ulyses Jarred M.D.   On: 02/18/2018 22:48    Procedures Procedures (including critical care time)  Medications Ordered in ED Medications  morphine 4 MG/ML injection 4 mg (4 mg Intravenous Given 02/18/18 2135)  ondansetron (ZOFRAN) injection 4 mg (4 mg Intravenous Given 02/18/18 2130)  iopamidol (ISOVUE-300) 61 % injection 100 mL (100 mLs Intravenous Contrast Given 02/18/18 2222)  metoCLOPramide (REGLAN) injection 10 mg (10 mg Intravenous Given 02/18/18 2355)  pantoprazole (PROTONIX) injection 40 mg (40 mg Intravenous Given 02/19/18 0002)     Initial Impression / Assessment and Plan / ED Course  I have reviewed the triage vital signs and the nursing notes.  Pertinent labs & imaging results that were available during my care of the patient were reviewed by me and  considered in my medical decision making (see chart for details).     Patient with a week and a half history of abdominal pain and nausea.  CBC, lipase unremarkable.  CMP shows creatinine 1.13.  UA shows moderate leukocytes, 6-30 WBCs.  CT abdomen pelvis is negative  for acute findings.  Considering moderate leukocytes on UA, will treat with Keflex and sent for culture.  Doubt this as cause for upper abdominal pain, but could be contributing to nausea.  I stressed the importance of patient following up for colonoscopy and further evaluation with GI.  I will discharge home with Protonix, Reglan.  Return precautions discussed.  Patient understands and agrees with plan.  Patient vitals stable throughout ED course and discharged in satisfactory condition  Final Clinical Impressions(s) / ED Diagnoses   Final diagnoses:  Epigastric pain  Nausea    ED Discharge Orders        Ordered    pantoprazole (PROTONIX) 20 MG tablet  Daily     02/19/18 0006    metoCLOPramide (REGLAN) 10 MG tablet  Every 6 hours     02/19/18 0006    cephALEXin (KEFLEX) 500 MG capsule  2 times daily     02/19/18 0020       Frederica Kuster, PA-C 02/19/18 Vicki Mallet, MD 02/19/18 1226

## 2018-02-19 MED ORDER — CEPHALEXIN 500 MG PO CAPS: 500.0000 mg | ORAL_CAPSULE | Freq: Two times a day (BID) | ORAL | 0 refills | Status: AC

## 2018-02-19 MED ORDER — METOCLOPRAMIDE HCL 10 MG PO TABS: 10.0000 mg | ORAL_TABLET | Freq: Four times a day (QID) | ORAL | 0 refills | Status: AC

## 2018-02-19 MED ORDER — PANTOPRAZOLE SODIUM 20 MG PO TBEC: 20.0000 mg | DELAYED_RELEASE_TABLET | Freq: Every day | ORAL | 0 refills | Status: AC

## 2018-02-19 NOTE — Discharge Instructions (Addendum)
Medications: Protonix, Reglan, Tylenol  Treatment: Take Protonix once daily.  Take Reglan every 6 hours as noted nausea or vomiting.  Make sure to stay well-hydrated. You can take Tylenol as prescribed over-the-counter in addition to your Nucynta at home for pain.  Follow-up: Please follow-up with your doctor and gastroenterologist for further evaluation and treatment of your symptoms.  I strongly advise you have a colonoscopy for further evaluation and treatment of your symptoms.  Please return the emergency department if you develop any new or worsening symptoms including localizing abdominal pain, fever, intractable vomiting, or any other new or concerning symptom.

## 2018-02-20 LAB — URINE CULTURE: Culture: NO GROWTH

## 2018-03-12 ENCOUNTER — Ambulatory Visit: Payer: Medicaid Other | Admitting: Neurology

## 2018-03-12 ENCOUNTER — Telehealth: Payer: Self-pay | Admitting: Neurology

## 2018-03-12 NOTE — Telephone Encounter (Signed)
This patient did not show for a new patient appointment today. 

## 2018-03-15 ENCOUNTER — Encounter: Payer: Self-pay | Admitting: Neurology

## 2018-04-21 ENCOUNTER — Ambulatory Visit: Payer: Self-pay | Admitting: Acute Care

## 2018-04-29 ENCOUNTER — Ambulatory Visit: Payer: Self-pay | Admitting: Acute Care

## 2018-04-29 NOTE — Progress Notes (Deleted)
History of Present Illness [name] is a 52 y.o. female  Current every day smoker with GOLD IV COPD /Asthma . She is followed by Dr. Vaughan Browner   Synopsis 52 year old female smoker followed for GOLD IV COPD /Asthma  Past medical history significant for multi lobar pneumonia February 2018 with lingular abscess and left hydropneumothorax subsequent VATS with decortication Recent ED Visit 01/2018 for epigastric pain  04/29/2018  Acute OV for L sided pain: Pt. Presents for Acute visit with c/o epigastric pain.   Test Results: CT Abdomen Pelvis 02/18/2018>> IMPRESSION: No acute abdominal or pelvic abnormality.  CXR : 10/2017>> IMPRESSION: 1. Chronic bilateral rib fractures. No acute osseous abnormality of the bony thorax. 2. Right upper lobe and lingular scarring with upper lobe predominant centrilobular emphysema. 3. Faint ground-glass opacity with 3 mm more solid nodular component at the right lung apex, the ground-glass opacity measuring 15 x 12 mm. Follow-up non-contrast CT recommended at 3-6 months to confirm Persistence. 4. No acute solid nor hollow visceral organ injury within the abdomen and pelvis.  PFT 04/03/17: FVC 1.11 L (38%) FEV1 0.57 L (24%) FEV1/FVC 0.51 FEF 25-75 0.27 L (10%) 02/21/16: FVC 1.72 L (89%) FEV1 0.93 L (38%) FEV1/FVC 0.54 FEF 25-75 0.40 (18%) positive bronchodilator response  IMAGING CT CHEST W/ CONTRAST 09/08/17 Apical predominant emphysematous changes. Multiple old bilateral rib fractures noted. No pathologic mediastinal adenopathy. No central pulmonary emboli. No parenchymal mass or opacity appreciated. No pleural effusion or thickening appreciated.  CARDIAC TTE (12/13/16):LV normal in size with EF 65-70%.Normal regional wall motion. Grade 2 diastolic dysfunction. LA mildly dilated &RA normal in size. RV mildly dilated with preserved systolic function. Pulmonary artery systolic pressure 44 mmHg. No aortic stenosis or regurgitation. Aortic  root normal in size. Trivial mitral regurgitation without stenosis. No significant pulmonic regurgitation. Mild tricuspid regurgitation. No pericardial effusion.  LABS 03/06/14 ANA: Negative Rheumatoid factor: 7.3 ESR: 2 Alpha-1 MM   166    CBC Latest Ref Rng & Units 02/18/2018 01/27/2018 12/15/2017  WBC 4.0 - 10.5 K/uL 9.0 9.7 9.6  Hemoglobin 12.0 - 15.0 g/dL 13.4 13.9 13.3  Hematocrit 36.0 - 46.0 % 41.0 42.0 41.5  Platelets 150 - 400 K/uL 316 300 356    BMP Latest Ref Rng & Units 02/18/2018 01/27/2018 12/15/2017  Glucose 65 - 99 mg/dL 88 99 85  BUN 6 - 20 mg/dL _0 Creatinine 0.44 - 1.00 mg/dL 1.13(H) 1.28(H) 1.49(H)  Sodium 135 - 145 mmol/L 136 141 136  Potassium 3.5 - 5.1 mmol/L 3.8 3.7 4.2  Chloride 101 - 111 mmol/L 101 103 101  CO2 22 - 32 mmol/L _1 Calcium 8.9 - 10.3 mg/dL 9.4 9.8 9.8    BNP    Component Value Date/Time   BNP 571.9 (H) 12/12/2015 1640    ProBNP No results found for: PROBNP  PFT No results found for: FEV1PRE, FEV1POST, FVCPRE, FVCPOST, TLC, DLCOUNC, PREFEV1FVCRT, PSTFEV1FVCRT  No results found.   Past medical hx Past Medical History:  Diagnosis Date  . Allergic rhinitis   . Arthritis   . Asthma   . Back pain   . Bipolar 1 disorder ([place])   . C. difficile diarrhea   . Cavitary lesion of lung   . Chronic low back pain 03/06/2014  . COPD (chronic obstructive pulmonary disease) ([place])   . Depression   . Fibroid   . Hypertension   . Hypomagnesemia   . IBS (irritable bowel syndrome)   .  Migraine   . Pneumonia   . Scoliosis   . Stroke [facility])      Social History   Tobacco Use  . Smoking status: Current Every Day Smoker    Packs/day: 0.75    Years: 30.00    Pack years: 22.50    Types: Cigarettes  . Smokeless tobacco: Never Used  Substance Use Topics  . Alcohol use: No  . Drug use: No    Ms.Chesterfield reports that she has been smoking cigarettes.  She has a 22.50 pack-year smoking history. She has never used smokeless  tobacco. She reports that she does not drink alcohol or use drugs.  Tobacco Cessation: Ready to quit: Not Answered Counseling given: Not Answered   Past surgical hx, Family hx, Social hx all reviewed.  Current Outpatient Medications on File Prior to Visit  Medication Sig  . acetaminophen (TYLENOL) 325 MG tablet Take 2 tablets (650 mg total) by mouth every 4 (four) hours as needed for mild pain (or temp > 37.5 C (99.5 F)).  Marland Kitchen albuterol (PROAIR HFA) 108 (90 Base) MCG/ACT inhaler Inhale 2 puffs into the lungs every 6 (six) hours as needed for wheezing or shortness of breath.  Marland Kitchen albuterol (PROVENTIL) (2.5 MG/3ML) 0.083% nebulizer solution Take 3 mLs (2.5 mg total) by nebulization every 4 (four) hours as needed for wheezing or shortness of breath.  . cephALEXin (KEFLEX) 500 MG capsule Take 1 capsule (500 mg total) by mouth 2 (two) times daily.  . cyclobenzaprine (FLEXERIL) 10 MG tablet Take 1 tablet (10 mg total) by mouth 2 (two) times daily as needed for muscle spasms. (Patient not taking: Reported on 02/01/2018)  . escitalopram (LEXAPRO) 20 MG tablet Take 2 tablets (40 mg total) by mouth daily. (Patient taking differently: Take 10 mg by mouth daily. )  . gabapentin (NEURONTIN) 400 MG capsule Take 1 capsule (400 mg total) by mouth 3 (three) times daily. (Patient not taking: Reported on 02/01/2018)  . hydrALAZINE (APRESOLINE) 100 MG tablet Take 1 tablet (100 mg total) by mouth every 6 (six) hours. (Patient taking differently: Take 100 mg by mouth daily. )  . hydrOXYzine (VISTARIL) 50 MG capsule Take 50 mg by mouth 3 (three) times daily as needed for anxiety.   . lamoTRIgine (LAMICTAL) 100 MG tablet Take 1 tablet (100 mg total) by mouth 2 (two) times daily.  Marland Kitchen levETIRAcetam (KEPPRA) 1000 MG tablet Take 1 tablet (1,000 mg total) by mouth 2 (two) times daily.  . meloxicam (MOBIC) 15 MG tablet Take 15 mg by mouth daily.  . methocarbamol (ROBAXIN) 750 MG tablet Take 750 mg by mouth 3 (three) times daily.    . metoCLOPramide (REGLAN) 10 MG tablet Take 1 tablet (10 mg total) by mouth every 6 (six) hours.  . metoprolol tartrate (LOPRESSOR) 100 MG tablet Take 100 mg by mouth 2 (two) times daily.  . ondansetron (ZOFRAN ODT) 4 MG disintegrating tablet Take 1 tablet (4 mg total) by mouth every 8 (eight) hours as needed for nausea or vomiting.  . pantoprazole (PROTONIX) 20 MG tablet Take 1 tablet (20 mg total) by mouth daily.  . predniSONE (DELTASONE) 10 MG tablet Take 4 tabs for 2 days, then 3 tabs for 2 days, 2 tabs for 2 days, then 1 tab for 2 days, then stop.  . QUEtiapine (SEROQUEL) 100 MG tablet Take 300 mg by mouth at bedtime.   Marland Kitchen Respiratory Therapy Supplies (FLUTTER) DEVI Use as directed  . rosuvastatin (CRESTOR) 20 MG tablet Take 20 mg by  mouth daily.  . tapentadol (NUCYNTA) 50 MG tablet Take 25 mg by mouth 3 (three) times daily as needed for moderate pain.  Marland Kitchen tiotropium (SPIRIVA) 18 MCG inhalation capsule Place 18 mcg into inhaler and inhale daily.  . Tiotropium Bromide-Olodaterol (STIOLTO RESPIMAT) 2.5-2.5 MCG/ACT AERS Inhale 2 puffs into the lungs daily.  . Tiotropium Bromide-Olodaterol (STIOLTO RESPIMAT) 2.5-2.5 MCG/ACT AERS Inhale 2 puffs into the lungs daily.   No current facility-administered medications on file prior to visit.      Allergies  Allergen Reactions  . Cymbalta [Duloxetine Hcl] Other (See Comments)    Mood changes...mean  . Paxil [Paroxetine Hcl] Other (See Comments)    Mean and aggressive Mean and aggressive  . Wellbutrin [Bupropion] Other (See Comments)    Rapid heartrate  . Zoloft [Sertraline] Other (See Comments)    Anger Anger  . Nyquil Multi-Symptom [Pseudoeph-Doxylamine-Dm-Apap] Hives  . Tramadol Itching    Review Of Systems:  Constitutional:   No  weight loss, night sweats,  Fevers, chills, fatigue, or  lassitude.  HEENT:   No headaches,  Difficulty swallowing,  Tooth/dental problems, or  Sore throat,                No sneezing, itching, ear ache,  nasal congestion, post nasal drip,   CV:  No chest pain,  Orthopnea, PND, swelling in lower extremities, anasarca, dizziness, palpitations, syncope.   GI  No heartburn, indigestion, abdominal pain, nausea, vomiting, diarrhea, change in bowel habits, loss of appetite, bloody stools.   Resp: No shortness of breath with exertion or at rest.  No excess mucus, no productive cough,  No non-productive cough,  No coughing up of blood.  No change in color of mucus.  No wheezing.  No chest wall deformity  Skin: no rash or lesions.  GU: no dysuria, change in color of urine, no urgency or frequency.  No flank pain, no hematuria   MS:  No joint pain or swelling.  No decreased range of motion.  No back pain.  Psych:  No change in mood or affect. No depression or anxiety.  No memory loss.   Vital Signs LMP 01/22/2014    Physical Exam:  General- No distress,  A&Ox3 ENT: No sinus tenderness, TM clear, pale nasal mucosa, no oral exudate,no post nasal drip, no LAN Cardiac: S1, S2, regular rate and rhythm, no murmur Chest: No wheeze/ rales/ dullness; no accessory muscle use, no nasal flaring, no sternal retractions Abd.: Soft Non-tender Ext: No clubbing cyanosis, edema Neuro:  normal strength Skin: No rashes, warm and dry Psych: normal mood and behavior   Assessment/Plan  No problem-specific Assessment & Plan notes found for this encounter.    Magdalen Spatz, NP 04/29/2018  8:37 AM

## 2018-05-21 ENCOUNTER — Encounter

## 2018-05-21 ENCOUNTER — Ambulatory Visit: Payer: Medicaid Other | Admitting: Neurology

## 2018-06-24 DEATH — deceased

## 2018-09-28 ENCOUNTER — Encounter
